# Patient Record
Sex: Female | Born: 1940
Health system: Southern US, Community
[De-identification: ages and names within clinical notes are randomized; demographics above are authoritative.]

## PROBLEM LIST (undated history)

## (undated) DIAGNOSIS — Z45018 Encounter for adjustment and management of other part of cardiac pacemaker: Secondary | ICD-10-CM

## (undated) DIAGNOSIS — D649 Anemia, unspecified: Secondary | ICD-10-CM

## (undated) DIAGNOSIS — I10 Essential (primary) hypertension: Secondary | ICD-10-CM

## (undated) DIAGNOSIS — M1990 Unspecified osteoarthritis, unspecified site: Secondary | ICD-10-CM

## (undated) DIAGNOSIS — E7800 Pure hypercholesterolemia, unspecified: Secondary | ICD-10-CM

## (undated) DIAGNOSIS — I442 Atrioventricular block, complete: Secondary | ICD-10-CM

## (undated) DIAGNOSIS — E119 Type 2 diabetes mellitus without complications: Secondary | ICD-10-CM

## (undated) HISTORY — PX: TUBAL LIGATION: SHX77

## (undated) HISTORY — PX: JOINT REPLACEMENT: SHX530

## (undated) HISTORY — PX: ABDOMINAL HYSTERECTOMY: SHX81

---
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
## 1997-12-24 ENCOUNTER — Ambulatory Visit: Admission: RE | Admit: 1997-12-24 | Discharge: 1997-12-24 | Payer: Self-pay | Admitting: *Deleted

## 1997-12-30 ENCOUNTER — Inpatient Hospital Stay (HOSPITAL_COMMUNITY): Admission: RE | Admit: 1997-12-30 | Discharge: 1998-01-02 | Payer: Self-pay | Admitting: Orthopedic Surgery

## 1998-01-02 ENCOUNTER — Inpatient Hospital Stay (HOSPITAL_COMMUNITY)
Admission: RE | Admit: 1998-01-02 | Discharge: 1998-01-08 | Payer: Self-pay | Admitting: Physical Medicine and Rehabilitation

## 1998-01-13 ENCOUNTER — Other Ambulatory Visit: Admission: RE | Admit: 1998-01-13 | Discharge: 1998-01-13 | Payer: Self-pay | Admitting: *Deleted

## 1999-02-23 ENCOUNTER — Ambulatory Visit (HOSPITAL_BASED_OUTPATIENT_CLINIC_OR_DEPARTMENT_OTHER): Admission: RE | Admit: 1999-02-23 | Discharge: 1999-02-23 | Payer: Self-pay | Admitting: Orthopedic Surgery

## 1999-07-01 ENCOUNTER — Encounter: Payer: Self-pay | Admitting: *Deleted

## 1999-07-01 ENCOUNTER — Encounter: Admission: RE | Admit: 1999-07-01 | Discharge: 1999-07-01 | Payer: Self-pay | Admitting: *Deleted

## 2000-08-05 ENCOUNTER — Encounter: Admission: RE | Admit: 2000-08-05 | Discharge: 2000-08-05 | Payer: Self-pay | Admitting: *Deleted

## 2000-08-05 ENCOUNTER — Encounter: Payer: Self-pay | Admitting: *Deleted

## 2001-07-23 ENCOUNTER — Emergency Department (HOSPITAL_COMMUNITY): Admission: EM | Admit: 2001-07-23 | Discharge: 2001-07-23 | Payer: Self-pay | Admitting: Emergency Medicine

## 2001-07-23 ENCOUNTER — Encounter: Payer: Self-pay | Admitting: Emergency Medicine

## 2002-11-28 ENCOUNTER — Encounter: Payer: Self-pay | Admitting: Internal Medicine

## 2002-11-28 ENCOUNTER — Encounter: Admission: RE | Admit: 2002-11-28 | Discharge: 2002-11-28 | Payer: Self-pay | Admitting: Internal Medicine

## 2003-01-08 ENCOUNTER — Other Ambulatory Visit: Admission: RE | Admit: 2003-01-08 | Discharge: 2003-01-08 | Payer: Self-pay | Admitting: Obstetrics and Gynecology

## 2003-02-19 ENCOUNTER — Ambulatory Visit (HOSPITAL_COMMUNITY): Admission: RE | Admit: 2003-02-19 | Discharge: 2003-02-19 | Payer: Self-pay | Admitting: Gastroenterology

## 2005-03-04 ENCOUNTER — Emergency Department (HOSPITAL_COMMUNITY): Admission: EM | Admit: 2005-03-04 | Discharge: 2005-03-04 | Payer: Self-pay | Admitting: Emergency Medicine

## 2005-12-21 ENCOUNTER — Other Ambulatory Visit: Admission: RE | Admit: 2005-12-21 | Discharge: 2005-12-21 | Payer: Self-pay | Admitting: Obstetrics and Gynecology

## 2006-01-11 ENCOUNTER — Encounter: Admission: RE | Admit: 2006-01-11 | Discharge: 2006-01-11 | Payer: Self-pay | Admitting: Obstetrics and Gynecology

## 2006-02-10 ENCOUNTER — Encounter (INDEPENDENT_AMBULATORY_CARE_PROVIDER_SITE_OTHER): Payer: Self-pay | Admitting: Specialist

## 2006-02-10 ENCOUNTER — Ambulatory Visit (HOSPITAL_COMMUNITY): Admission: RE | Admit: 2006-02-10 | Discharge: 2006-02-11 | Payer: Self-pay | Admitting: Obstetrics and Gynecology

## 2007-01-02 ENCOUNTER — Ambulatory Visit (HOSPITAL_COMMUNITY): Admission: RE | Admit: 2007-01-02 | Discharge: 2007-01-03 | Payer: Self-pay | Admitting: Obstetrics and Gynecology

## 2007-01-23 ENCOUNTER — Inpatient Hospital Stay (HOSPITAL_COMMUNITY): Admission: EM | Admit: 2007-01-23 | Discharge: 2007-01-25 | Payer: Self-pay | Admitting: Emergency Medicine

## 2007-01-30 ENCOUNTER — Encounter: Admission: RE | Admit: 2007-01-30 | Discharge: 2007-01-30 | Payer: Self-pay | Admitting: Obstetrics and Gynecology

## 2008-03-07 ENCOUNTER — Encounter: Admission: RE | Admit: 2008-03-07 | Discharge: 2008-03-07 | Payer: Self-pay | Admitting: Obstetrics and Gynecology

## 2009-03-17 ENCOUNTER — Encounter: Admission: RE | Admit: 2009-03-17 | Discharge: 2009-03-17 | Payer: Self-pay | Admitting: Obstetrics and Gynecology

## 2009-06-13 ENCOUNTER — Encounter: Admission: RE | Admit: 2009-06-13 | Discharge: 2009-06-13 | Payer: Self-pay | Admitting: Orthopedic Surgery

## 2010-01-26 ENCOUNTER — Emergency Department (HOSPITAL_COMMUNITY): Admission: EM | Admit: 2010-01-26 | Discharge: 2010-01-26 | Payer: Self-pay | Admitting: Emergency Medicine

## 2010-04-20 ENCOUNTER — Encounter: Admission: RE | Admit: 2010-04-20 | Discharge: 2010-04-20 | Payer: Self-pay | Admitting: Obstetrics and Gynecology

## 2010-11-16 LAB — BASIC METABOLIC PANEL
BUN: 19 mg/dL (ref 6–23)
CO2: 24 mEq/L (ref 19–32)
Calcium: 9 mg/dL (ref 8.4–10.5)
Creatinine, Ser: 0.79 mg/dL (ref 0.4–1.2)
GFR calc Af Amer: >60 mL/min (ref >60)
GFR calc non Af Amer: >60 mL/min (ref >60)
Glucose, Bld: 181 mg/dL — ABNORMAL HIGH (ref 70–99)
Potassium: 4.1 mEq/L (ref 3.5–5.1)

## 2010-11-16 LAB — CBC
Hemoglobin: 10.7 g/dL — ABNORMAL LOW (ref 12.0–15.0)
MCV: 78.2 fL (ref 78.0–100.0)

## 2010-11-16 LAB — POCT CARDIAC MARKERS
CKMB, poc: 1 ng/mL (ref 1.0–8.0)
Myoglobin, poc: 70.7 ng/mL (ref 12–200)
Troponin i, poc: <0.05 ng/mL (ref 0.00–0.09)

## 2011-01-12 NOTE — Discharge Summary (Signed)
NAMEMARANGELY, SANANDRES                   ACCOUNT NO.:  [id number]   MEDICAL RECORD NO.:  [id number]          PATIENT TYPE:  INP   LOCATION:  6529                         FACILITY:  [facility]   PHYSICIAN:  Neysa Bonito, MD  DATE OF BIRTH:  04-25-1941   DATE OF ADMISSION:  01/23/2007  DATE OF DISCHARGE:                               DISCHARGE SUMMARY   PRIMARY CARE PHYSICIAN:  Dr. Jilda Panda.   CHIEF COMPLAINT ON PRESENTATION:  Chest pain.   HISTORY OF PRESENT ILLNESS:  [age]-year-old with past medical  history significant for type 2 diabetes, hypertension, presented with  chest pain and admitted for further assessment and to rule out MI/ACS.   HOSPITAL COURSE:  During hospital course the patient has been stable.  Pain is relieved and cardiac enzymes is negative 3 times.  Patient had  2D echo pending.  The echo is done, but the result is pending.  So, we  will review the 2D echo and recommend result on the paper chart.   DISCHARGE PLAN:  Patient is ruled out for acute coronary syndrome.  She  has a history of diabetes, obesity and abnormal EKGs with evidence of  left bundle branch block make it difficult to assess the ST and T waves.  The plan is to discharge the patient and follow up with her primary  physician, Dr. Mellody Drown, for further assessment and maybe possible stress  test as outpatient.   DISCHARGE DIAGNOSES:  1. Chest pain, not secondary to acute coronary syndrome.  2. Type 2 diabetes.  3. Hypertension.   DISCHARGE MEDICATIONS:  1. Aspirin 325 mg p.o. daily.  2. Insulin is sliding scale.  3. Avapro 150 mg p.o. daily.  4. Metoprolol 25 mg b.i.d.  5. Protonix 40 mg daily.  6. Zocor 10 mg p.o. at night.  7. Dulcolax 10 mg p.r.n.  8. Ambien p.r.n.   DISCHARGE PLAN:  Again, the patient is stable for discharge today, to  follow up with her primary for further testing as necessary.      Neysa Bonito, MD  Electronically Signed     EME/MEDQ  D:  01/25/2007   T:  01/25/2007  Job:  [id number]

## 2011-01-12 NOTE — H&P (Signed)
NAMEBRADLIE, Krista Clarke                   ACCOUNT NO.:  [id number]   MEDICAL RECORD NO.:  [id number]          PATIENT TYPE:  EMS   LOCATION:  MAJO                         FACILITY:  [facility]   PHYSICIAN:  Jana Hakim, M.D. DATE OF BIRTH:  19-Jun-1941   DATE OF ADMISSION:  01/23/2007  DATE OF DISCHARGE:                              HISTORY & PHYSICAL   PRIMARY CARE PHYSICIAN:  Dr. Jilda Panda.   CHIEF COMPLAINT:  Chest pain.   HISTORY OF PRESENT ILLNESS:  This is a 70 year old female who presented  with complaints of left-sided chest pain radiating into the left arm.  The pain started in the afternoon and was described as being  intermittent at first, but became a constant unremitting pain.  She  rated the pain at the worst as being on a 9/10 and described the pain as  being heaviness in the chest and in the arm.  She also reports having  symptoms of shortness of breath and diaphoresis associated with the  discomfort.  The pain was not relieved until she arrived in the  emergency department and was administered nitroglycerin sublingual x1.  She denies having any nausea, vomiting and diarrhea associated with the  pain.  She denies having any previous similar episodes.   PAST MEDICAL HISTORY:  1. Type 2 diabetes mellitus.  2. Hypertension.   PAST SURGICAL HISTORY:  1. Status post bilateral tubal ligation.  2. Bilateral knee replacements, the left in [phone], the right      in [id number].  3. Recent bladder suspension surgery on May5,2008.   MEDICATIONS:  Medications need to be verified.  The medications are  reported as being metoprolol, Micardis, lovastatin, glipizide, Nexium,  Ecotrin and calcium.   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  No tobacco or alcohol usage.   FAMILY HISTORY:  Positive for coronary artery disease in her mother.  Positive for hypertension in both of her parents.   Positive for diabetes  in her mother and positive for cancer; brother had squamous cell  carcinoma of the larynx.   REVIEW OF SYSTEMS:  Pertinent are mentioned above.  The patient denies  having any syncope or seizures associated with the episode.   PHYSICAL EXAMINATION:  GENERAL:  A pleasant 70 year old obese female in  no acute distress currently.  VITAL SIGNS:  Temperature 98.6, blood pressure 149/58, heart rate 74,  respirations 20, O2 saturations 97%.  HEENT:  Normocephalic, atraumatic.  Pupils are equally round and  reactive to light.  Extraocular muscles are intact.  Funduscopic benign.  Oropharynx is clear.  NECK:  Supple, full range of motion.  No thyromegaly, adenopathy or  jugular venous distention.  CARDIOVASCULAR:  Regular rate and rhythm.  No murmurs, gallops or rubs.  LUNGS:  Clear to auscultation bilaterally.  ABDOMEN:  Positive bowel sounds.  Soft, nontender, nondistended.  EXTREMITIES:  Without edema.  NEUROLOGIC:  The patient is alert and oriented x3.  There are no focal  deficits.  GENITOURINARY/RECTAL:  Deferred.  LABORATORY STUDIES:  White blood cell count 6.6, hemoglobin 9.9,  hematocrit 30.5, MCV 76.0, platelets 320,000.  Cardiac enzymes:  Myoglobin 50.4, CK-MB less than 1.0, troponin less than 0.05.  D-dimer  level equal to 0.77.   EKG:  Normal sinus rhythm with a left bundle branch block.   Chest x-ray reveals no acute disease process.   CT scan of the chest with the angiogram is negative for a pulmonary  embolism.  Of note, a 2- to 3-mm peripheral nodule is seen in the right  upper lobe suggestive of nodular scarring.   ASSESSMENT:  [age]-year-old female being admitted with:  1. Chest pain, rule out myocardial infarction.  2. Type 2 diabetes mellitus.  3. Hypertension.  4. Microcytic anemia.   PLAN:  The patient will be admitted to a telemetry area and cardiac  enzymes will be cycled.  The patient will be placed on nitrates, oxygen  and  beta blocker therapy and she will continue on her regular  medications, once they are verified.  Sliding-scale insulin coverage has  also been ordered..  The patient has been placed on DVT and GI  prophylaxis as well.  A 2-D echo has also been ordered.  An anemia  workup will also follow.      Jana Hakim, M.D.  Electronically Signed     HJ/MEDQ  D:  01/23/2007  T:  01/23/2007  Job:  [id number]   cc:   Jilda Panda, M.D.

## 2011-01-15 NOTE — Discharge Summary (Signed)
NAMEJIMENNA, INTERIANO                   ACCOUNT NO.:  [id number]   MEDICAL RECORD NO.:  [id number]          PATIENT TYPE:  OIB   LOCATION:  9320                          FACILITY:  [facility]   PHYSICIAN:  Everett Graff, M.D.   DATE OF BIRTH:  May 07, 1941   DATE OF ADMISSION:  02/10/2006  DATE OF DISCHARGE:  02/11/2006                                 DISCHARGE SUMMARY   DISCHARGE DIAGNOSES:  1.  Fibroid uterus, uterine prolapse, symptomatic cystocele, rectocele,      urinary incontinence with hypermobile urethra.  2.  Anemia.   OPERATION:  On the date of admission the patient underwent a  laparoscopically-assisted vaginal hysterectomy with bilateral salpingo-  oophorectomy, anterior and posterior repair with placement of tension-free  vaginal tape.  The patient also went underwent cystoscopy demonstrating  patency of both ureters.   HISTORY OF PRESENT ILLNESS:  Ms. Sartwell is a 70 year old married African-  American female who presents for a laparoscopically-assisted vaginal  hysterectomy with bilateral salpingo-oophorectomy, anterior-posterior repair  and placement of tension-free vaginal tape, because of uterine prolapse,  symptomatic cystocele causing incontinence, and a rectocele.  Please see the  patient's dictated history and physical examination for details.   PREOPERATIVE PHYSICAL EXAMINATION:  VITAL SIGNS:  Blood pressure 140/78,  weight is 221 pounds, height is 5 feet 3 inches tall.  GENERAL:  Within normal limits.  PELVIC EXAM: EGBUS is atrophic.  Vagina reveals pelvic relaxation with 3/4  cystocele, it too is atrophic.  The patient has a second-degree uterine  prolapse.  Her cervix is nontender without lesions.  Uterus appears normal  size, shape and consistency.  However, it is tender (the patient's exam was  limited by body habitus).  Adnexa was without masses, though it was tender  bilaterally, and the patient was noted to have a second-degree rectocele.  Her rectovaginal exam  did not reveal any masses.   HOSPITAL COURSE:  On the day of admission the patient underwent  aforementioned procedures, tolerating them all well.  Postoperative course  was unremarkable with the patient tolerating a hemoglobin of 8.1  (preoperative hemoglobin was 10.0).  By postoperative  day #1, the patient  had resumed bowel and bladder function and was therefore deemed ready for  discharge home.   DISCHARGE MEDICATIONS:  1.  Motrin 600 mg q.6h. with food as needed for pain.  2.  Vicodin one to two tablets q.4-6h. as needed for breakthrough pain.  3.  Estrace vaginal cream 1 gm daily for one week, then twice weekly.   FOLLOW UP:  The patient is scheduled for a blood pressure check at [facility] on February 14, 2006, at 9:30 a.m.  She is scheduled for six  weeks postoperative visit with Dr. Mancel Bale on March 29, 2006, at 2:00 p.m.   DISCHARGE INSTRUCTIONS:  The patient was given a copy of [facility] postoperative instruction sheet.  She was advised to call the doctor  with any temperature greater than or equal to 100.4, severe abdominal pain,  vomiting, or headache.  The patient was also advised  to not take her blood  pressure medicine over the weekend.  She was further advised to avoid  driving for two weeks, heavy lifting for four weeks, intercourse for six  weeks, that she may walk up steps, may shower, may increase her activities  slowly.  Her diet is to be a diabetic diet.   FINAL PATHOLOGY:  Uterus, hysterectomy:  Cervix with mucosal ulceration,  inflammation, and reactive changes; benign inactive endometrium;  leiomyomata; bilateral ovaries and fallopian tubes resection--benign,  unremarkable ovaries and fallopian tubes.      Elmira J. Elizebeth Koller.      Everett Graff, M.D.  Electronically Signed    EJP/MEDQ  D:  03/13/2006  T:  03/13/2006  Job:  [id number]

## 2011-01-15 NOTE — Op Note (Signed)
NAMESTERLING, Krista Clarke                   ACCOUNT NO.:  [id number]   MEDICAL RECORD NO.:  [id number]          PATIENT TYPE:  OIB   LOCATION:  9307                          FACILITY:  [facility]   PHYSICIAN:  Everett Graff, M.D.   DATE OF BIRTH:  [birth year]   DATE OF PROCEDURE:  01/02/2007  DATE OF DISCHARGE:                               OPERATIVE REPORT   PREOPERATIVE DIAGNOSES:  1. Recurrent symptomatic cystocele.  2. Urge incontinence.   POSTOPERATIVE DIAGNOSES:  1. Recurrent symptomatic cystocele.  2. Urge incontinence.   PROCEDURE:  Anterior repair with Gynemesh placement and cystoscopy.   ATTENDING:  Dr. Everett Graff.   ASSISTANT:  Elmira J. Elizebeth Koller.   ANESTHESIA:  General.   SPECIMENS TO PATHOLOGY:  None.   FLUIDS:  1700 mL.   URINE OUTPUT:  200 mL.   ESTIMATED BLOOD LOSS:  200 mL.   COMPLICATIONS:  None.   PROCEDURE:  The patient was taken to the operating room after the risks,  benefits, and alternatives were reviewed with the patient.  The patient  verbalized understanding and consent signed and witnessed.  The risks  included but were not limited to bleeding, infection, injury and  exposure of the mesh.  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  The patient's primary complaints are bulge in the vagina and  return of urge incontinence since bulge  has returned.  The patient  primarily desires treatment for that.   The patient was placed under general anesthesia and prepped and draped  in the normal sterile fashion in the dorsal lithotomy position.  A  weighted speculum was placed in the vagina and 100 mL of dilute  Pitressin of 20 units of Pitressin and 100 mL of saline was administered  into the anterior vaginal wall mucosa.  This hydrodissection was  performed and then an incision was made and the  vaginal mucosa dissected  away from the bladder.  This was done down to the level of the ischial  spines bilaterally.  The mesh was then cut with four arms and the  distal/apical arms placed deep in the pelvis towards the ischial spines.  The defect was entirely covered with the mesh and the two more proximal  arms were shorter but also placed deeper in the pelvis.  Prior to  placing the mesh the two defects over the bladder were plicated with 2-0  Vicryl.  The mesh was anchored in place proximally.  There was some  bleeding on the patient's right which was made hemostatic with two 3-0  Vicryl stitches.  Two size 7 round JP drains were placed in the  dissected areas toward the ischial spines and the vaginal mucosa was  repaired with 2-0 Vicryl via interrupted stitches.   Sponge, lap and needle count was correct.  The patient tolerated the  procedure well and was returned to the recovery room in good condition.  Everett Graff, M.D.  Electronically Signed     AR/MEDQ  D:  01/03/2007  T:  01/03/2007  Job:  [id number]

## 2011-01-15 NOTE — H&P (Signed)
NAME:  Nobles, Reeve                   ACCOUNT NO.:  [id number]   MEDICAL RECORD NO.:  [id number]          PATIENT TYPE:  AMB   LOCATION:  SDC                           FACILITY:  [facility]   PHYSICIAN:  Everett Graff, M.D.   DATE OF BIRTH:  27-Feb-1941   DATE OF ADMISSION:  02/10/2006  DATE OF DISCHARGE:                                HISTORY & PHYSICAL   HISTORY OF PRESENT ILLNESS:  Ms. Matesic is a 70 year old married African-  American female who presents for a laparoscopically-assisted vaginal  hysterectomy with bilateral salpingo-oophorectomy, anterior-posterior  repair, and placement of tension-free vaginal tape because of uterine  prolapse, symptomatic cystocele with incontinence and a rectocele.  For the  past several months the patient has noted a bulge in her vaginal area  whenever she wipes.  Additionally, she has had increasing urinary urgency  with generalized abdominal tenderness and the urge to strain to completely  empty her bladder.  She denies any hematuria, fever, nausea or vomiting,  diarrhea, dysuria or changes in her bowel habits.  In April 2007 the patient  had a pelvic ultrasound which was within normal limits.  Cystometrics done  at that same time revealed a hypermobile urethra and a diagnosis of urge  incontinence.  After consideration of her medical and surgical options for  management of all her symptoms, the patient has decided to proceed with  definitive therapy in the form of hysterectomy with anterior-posterior  repair and placement of tension-free vaginal tape.   PAST MEDICAL HISTORY:  OB history:  Gravida 6, para 5-0-1-5.  She had  spontaneous vaginal births each time.   GYN history:  Menarche at 70 years old.  The patient has been menopausal for  15 years.  Denies any history of sexually transmitted diseases.  Her last  Pap smear was low-grade squamous intraepithelial lesion (December 21, 2005).  She had a normal mammogram in 2004.   Medical history is positive  for hypertension, anemia, diabetes mellitus and  knee arthritis.   Surgical history:  In 1974, bilateral tubal ligation.   FAMILY HISTORY:  Heart disease, throat cancer, hypertension, diabetes  mellitus and strokes.   HABITS:  The patient does not use cigarettes, alcohol or illicit drugs.   SOCIAL HISTORY:  The patient is retired and lives at home with her husband.   CURRENT MEDICATIONS:  1.  Metformin 100 mg twice daily.  2.  Lovastatin 40 mg daily.  3.  Metoprolol 50 mg twice daily.  4.  Glipizide 10 mg twice daily.  5.  Micardis HCT 80/12.5 mg one tablet daily.  6.  Iron twice daily.  7.  Calcium 600 mg twice daily.  8.  Multivitamin one tablet daily.  9.  Allegra 180 mg one tablet daily when needed.  [place] liver oil one tablespoon daily.  11. Detrol LA 4 mg one tablet daily.   The patient has no known drug allergies.   REVIEW OF SYSTEMS:  The patient does wear glasses.  She does complain of  incontinence.  Denies chest pain, shortness of breath and, except  as  mentioned in history of present illness, the patient's review of systems is  negative.   PHYSICAL EXAMINATION:  VITAL SIGNS:  Blood pressure is 140/78, weight is 221  pounds, height is 5 feet 3 inches tall.  NECK:  Supple without masses.  There is no thyromegaly or cervical  adenopathy.  CARDIAC:  Regular rate and rhythm.  There is no murmur.  LUNGS:  Clear to auscultation.  There are no wheezes, rales or rhonchi.  BACK:  No CVA tenderness.  ABDOMEN:  Bowel sounds are present.  It is soft with diffuse tenderness but  no guarding or rebound.  EXTREMITIES:  Without clubbing, cyanosis, or edema.  PELVIC:  EG, BUS is atrophic.  Vagina reveals pelvic relaxation with 3/4  cystocele.  It too is atrophic.  The patient has second degree uterine  prolapse.  Cervix is nontender without lesions.  Uterus appears normal size,  shape and consistency, tender.  Exam is limited by body habitus.  Adnexa  without masses, though  it is tender bilaterally.  The patient is noted to  have a second degree rectocele.  Rectovaginal without masses.   IMPRESSION:  1.  Uterine prolapse.  2.  Symptomatic cystocele.  3.  Rectocele.  4.  Urge incontinence.   DISPOSITION:  A discussion was held with the patient regarding the  indication for her various procedures along with their risks, which include  but are not limited to reaction to anesthesia, damage to adjacent organs,  infection, excessive bleeding, possible erosion of her tension-free vaginal  tape and the possibility that her procedures will not eliminate her urinary  tract symptoms.  The patient verbalized understanding and has consented to  proceed with a laparoscopically-assisted vaginal hysterectomy with bilateral  salpingo-oophorectomy, along with anterior-posterior repair and placement of  tension-free vaginal tape and cystoscopy at [facility]  on February 10, 2006, at 11 a.m.      Elmira J. Elizebeth Koller.      Everett Graff, M.D.  Electronically Signed    EJP/MEDQ  D:  02/07/2006  T:  02/07/2006  Job:  [id number]

## 2011-01-15 NOTE — Op Note (Signed)
NAMECOZY, EADS                   ACCOUNT NO.:  [id number]   MEDICAL RECORD NO.:  [id number]          PATIENT TYPE:  OIB   LOCATION:  9320                          FACILITY:  [facility]   PHYSICIAN:  Everett Graff, M.D.   DATE OF BIRTH:  1941/05/05   DATE OF PROCEDURE:  02/10/2006  DATE OF DISCHARGE:  02/11/2006                                 OPERATIVE REPORT   PREOPERATIVE DIAGNOSES:  1.  Uterine prolapse.  2.  Symptomatic cystocele and rectocele.  3.  Urinary incontinence with hypermobile urethra.   POSTOPERATIVE DIAGNOSES:  1.  Uterine prolapse.  2.  Symptomatic cystocele and rectocele.  3.  Urinary incontinence with hypermobile urethra.   PROCEDURES:  1.  Laparoscopically-assisted vaginal hysterectomy.  2.  Anterior and posterior repair.  3.  Tension-free vaginal tape.  4.  Cystoscopy.  5.  Bilateral salpingo-oophorectomy.   SURGEON:  Everett Graff, M.D.   ASSISTANT:  Jon Billings. Powell, P.A.-C.   ANESTHESIA:  General.   SPECIMENS TO PATHOLOGY:  Uterus, cervix and bilateral ovaries and tubes.   FLUIDS:  3600 mL.   URINE OUTPUT:  1000 mL.   ESTIMATED BLOOD LOSS:  200 mL.   FINDINGS:  Uterus weighing less than 250 g.   COMPLICATIONS:  None.   PROCEDURE:  The patient was taken to the operating room after risks,  benefits and alternatives reviewed with the patient.  The patient verbalized  understanding and consent signed and witnessed.  The patient was placed  under general anesthesia and prepped and draped in normal sterile fashion in  the dorsal lithotomy position.  A bivalve speculum was placed in the  patient's vagina and a Hulka placed for intrauterine manipulation.  Attention was then turned to the abdomen where a 10 mm incision was made at  the umbilicus and Veress needle passed into the intra-abdominal cavity and  pneumoperitoneum achieved.  Veress needle was removed and a 10 mm trocar  advanced into the intra-abdominal cavity nonbladed.  The laparoscope  was  introduced and normal-appearing uterus, bilateral ovaries and fallopian  tubes.  The left lower quadrant was then identified and a 5 mm incision made  and 5 mm trocar advanced under direct visualization.  The same was done in  the right lower quadrant as well.  The right round ligament was elevated  after identifying the ureters peristalsed bilaterally and the right round  ligament was then cauterized with the gyrus tripolar and cut and bladder  flap created.  The utero-ovarian ligament was then cauterized and cut with a  gyrus tripolar as well.  The fallopian tube in a similar fashion was  cauterized and cut.  The ovary and fallopian tube was then free and two  Endoloops of 0 Vicryl were placed on the ovary and fallopian tube which were  then excised and placed in the posterior cul-de-sac.  The pedicle was noted  to be hemostatic.  In a similar fashion on the contralateral side, the round  ligament, utero-ovarian ligament and fallopian tube were cauterized with the  gyrus tripolar and cut and once the ovary  and fallopian tube were free, two  0 Vicryl Endoloops were placed and ovary and fallopian tube excised and  placed in the posterior cul-de-sac.  The pedicle was also noted to be  hemostatic.  Bladder flap had been created on the left side as well.  The  ureters were noted again to peristals and instruments were covered in a  sterile fashion and attention was then turned below to the vaginal  hysterectomy portion of the case.  The Hulka was removed and weighted  speculum and vaginal wall retractors placed.  The cervix was circumscribed  with the Bovie and the anterior cul-de-sac entered without difficulty.  The  posterior cul-de-sac was entered as well with the Mayo scissors and long  weighted speculum placed.  The uterosacral ligaments were bilaterally  clamped with curved Heaney, cut and suture ligated with 0 Vicryl.  The  paracervical and remaining parametrial tissue  incorporating the cardinal  ligament as well was clamped, cut and suture ligated using 0 Vicryl until  the uterus was able to be removed and sent to pathology.  The pedicles were  noted to be hemostatic, a McCall culdoplasty stitch was placed using 0  Vicryl.  The anterior repair was then performed and the anterior vaginal  wall was undermined and dissected away from the underlying tissue.  The  cystocele was repaired using Kelly plication stitches using 2-0 Vicryl.  The  excess vaginal mucosa was excised and the anterior vaginal wall repaired  using 2-0 Vicryl in a running locked fashion.  Dilute Pitressin was injected  underneath the mid urethra and in a similar fashion the vaginal mucosa was  incised and the underlying tissue dissected away from the vaginal mucosa to  the lower edge of the symphysis pubis bilaterally.  To 5 mm incisions were  made approximately two fingerbreadths from the midline on the mons pubis.  The transabdominal guides, after emptying the bladder completely and placing  a rigid urethral catheter guide along with 18-French Foley catheter.  The  transabdominal guide was placed on the patient's left and passed through the  space of Retzius out through the vaginal wall while the rigid ureteral  catheter guide was deflected to the ipsilateral side.  The same was done on  the contralateral side.  Cystoscopy was then performed and no inadvertent  bladder injury was noted.  The mesh was attached to the transabdominal guide  and elevated through the level of the incisions on the mons pubis through  the space of Retzius.  Cystoscopy was performed again and no inadvertent  bladder injury was noted.  While passing the mesh, the rigid ureteral  catheter guide was deflected to the ipsilateral side.  The mesh was left  slack beneath the mid urethra using approximately the width of a large  Kelly.  The vaginal mucosa was repaired with 2-0 Vicryl via interrupteds. The mesh was cut  off flush with the two incisions at the mons pubis and  repaired with Dermabond.  Attention was then turned to the posterior vaginal  wall which was injected with dilute Pitressin and the posterior vaginal  mucosa incised and the underlying tissue dissected away from the vaginal  mucosa.  The rectocele was repaired with Kelly plication stitches using 2-0  Vicryl.  The excess vaginal mucosa was excised and the posterior repair  closed using 2-0 Vicryl in a running locked fashion.  Gloves were then  changed and attention was then turned again to the abdomen and  pneumoperitoneum achieved  again and laparoscope introduced.  There was  hemostasis noted at all pedicle sites.  The pneumoperitoneum was relieved  and the two lower quadrant trocars were removed under direct visualization  and good hemostasis was noted.  The 10 mm trocar was removed under direction  visualization as well and the fascia repaired at the umbilicus with 0 Vicryl  in a running fashion.  The subcutaneous tissue was also reapproximated using  0 Vicryl and the skin was reapproximated  using 3-0 Monocryl.  The two 5 mm lower quadrant incisions were repaired  with Dermabond and incisions were dressed.  Sponge, lap and needle count was  correct.  The patient tolerated the procedure well and was returned to the  recovery room in good condition.      Everett Graff, M.D.  Electronically Signed     AR/MEDQ  D:  02/28/2006  T:  02/28/2006  Job:  [id number]

## 2011-01-15 NOTE — H&P (Signed)
NAME:  Krista Clarke, Krista Clarke                   ACCOUNT NO.:  [id number]   MEDICAL RECORD NO.:  [id number]          PATIENT TYPE:  AMB   LOCATION:  SDC                           FACILITY:  [facility]   PHYSICIAN:  Everett Graff, M.D.   DATE OF BIRTH:  1941/05/22   DATE OF ADMISSION:  DATE OF DISCHARGE:                              HISTORY & PHYSICAL   HISTORY OF PRESENT ILLNESS:  Ms. Binkerd is a 70 year old married African-  American female, para 5-0-1-5, who is status post laparoscopic-assisted  vaginal hysterectomy with bilateral salpingo-oophorectomy,  anterior/posterior repair, with placement of tension-free vaginal tape  (01/2006), who presents for anterior repair with mesh because of a  symptomatic cystocele and urge incontinence.  Patient reports that  approximately two months ago she developed a sensation as if her  bladder had dropped, which resembled her previous symptoms prior to  her surgery last year.  She went on to report urinary frequency, urgency  and incontinence.  In spite of a negative urinalysis, a trial of Kegel  exercises, and the use of Detrol-LA, patient's symptoms persisted.  She  presents now for a repeat anterior repair with replacement of tension-  free vaginal tape with mesh for management of her symptoms.   PAST MEDICAL HISTORY:  OB history:  Gravida 6, para 5-0-1-5.  Patient  had spontaneous vaginal births each time.  GYN history:  Menarche 70  years old.  Patient has had a hysterectomy.  She denies any history of  sexually transmitted diseases.  She does have a history of an abnormal  Pap smear in April of 2007 which revealed low-grade squamous  intraepithelial lesion (that was her last Pap smear).  Patient had a  normal mammogram in 2007.  Patient's medical history is positive for  hypertension, anemia, non-insulin-dependent diabetes, and knee  arthritis.   PAST SURGICAL HISTORY:  In 1974, bilateral tubal ligation.  In 2007,  laparoscopic-assisted vaginal hysterectomy  with bilateral salpingo-  oophorectomy, anterior/posterior repair with placement of tension-free  vaginal tape.  Denies any history of blood transfusions or problems with  anesthesia.   FAMILY HISTORY:  1. Heart disease.  2. Throat cancer.  3. Hypertension.  4. Diabetes mellitus.  5. Strokes.   HABITS:  Patient does not use cigarettes, alcohol or illicit drugs.   SOCIAL HISTORY:  Patient is retired and lives at home with her husband.   CURRENT MEDICATIONS:  1. Metformin 1000 mg twice daily.  2. Lovastatin 40 mg daily.  3. Metoprolol 50 mg twice daily.  4. Glipizide 10 mg twice daily.  5. Micardis HCT 80/25 mg one tablet daily.  6. Iron twice daily.  7. Calcium 600 mg twice daily (with vitamin D).  8. Multivitamin one tablet daily.  9. Allegra 180 mg one tablet daily as needed.  10.Detrol-LA 40 mg daily.   Patient has no known drug allergies.   REVIEW OF SYSTEMS:  Patient does wear glasses.  Does complain of urinary  incontinence.  She denies any chest pain, shortness of breath, headache,  vision changes, and except as mentioned in history of present  illness,  patient's review of systems is negative.   PHYSICAL EXAMINATION:  VITAL SIGNS:  Blood pressure 120/70, height 5  feet 3 inches tall, weight 221 pounds.  BACK:  No CVA tenderness.  ABDOMEN:  Without tenderness, masses or organomegaly.  PELVIC:  EG/BUS is normal.  Vagina is normal.  Uterus and cervix are  surgically absent, however, do note patient does have a cystocele.  Adnexa without tenderness or masses.   IMPRESSION:  1. Symptomatic cystocele.  2. Urge incontinence.   DISPOSITION:  A discussion was held with patient regarding the  indication for her procedure along with its risks, which include but are  not limited to, reaction to anesthesia, damage to adjacent organs,  infection, excessive bleeding, erosion of tension-free vaginal  tape/mesh, and worsening of her incontinence symptoms.  Patient has   consented to proceed with an anterior repair and placement of tension-  free vaginal tape with mesh at [facility] on Jan 02, 2007.      [place] J. Elizebeth Koller.      Everett Graff, M.D.  Electronically Signed    EJP/MEDQ  D:  12/30/2006  T:  12/30/2006  Job:  [id number]

## 2011-07-13 ENCOUNTER — Other Ambulatory Visit: Payer: Self-pay | Admitting: Family Medicine

## 2011-07-13 DIAGNOSIS — Z1231 Encounter for screening mammogram for malignant neoplasm of breast: Secondary | ICD-10-CM

## 2011-08-06 ENCOUNTER — Ambulatory Visit
Admission: RE | Admit: 2011-08-06 | Discharge: 2011-08-06 | Disposition: A | Discharge status: Home / Self Care | Payer: Medicare Other | Source: Ambulatory Visit | Attending: Family Medicine | Admitting: Family Medicine

## 2011-08-06 ENCOUNTER — Other Ambulatory Visit: Payer: Self-pay | Admitting: Internal Medicine

## 2011-08-06 DIAGNOSIS — Z1231 Encounter for screening mammogram for malignant neoplasm of breast: Secondary | ICD-10-CM

## 2012-06-08 ENCOUNTER — Emergency Department (HOSPITAL_COMMUNITY)
Admission: EM | Admit: 2012-06-08 | Discharge: 2012-06-08 | Disposition: A | Discharge status: Home / Self Care | Payer: Medicare Other | Attending: Emergency Medicine | Admitting: Emergency Medicine

## 2012-06-08 ENCOUNTER — Encounter (HOSPITAL_COMMUNITY): Payer: Self-pay | Admitting: *Deleted

## 2012-06-08 ENCOUNTER — Emergency Department (HOSPITAL_COMMUNITY): Payer: Medicare Other

## 2012-06-08 DIAGNOSIS — E119 Type 2 diabetes mellitus without complications: Secondary | ICD-10-CM | POA: Insufficient documentation

## 2012-06-08 DIAGNOSIS — Z79899 Other long term (current) drug therapy: Secondary | ICD-10-CM | POA: Insufficient documentation

## 2012-06-08 DIAGNOSIS — R110 Nausea: Secondary | ICD-10-CM | POA: Insufficient documentation

## 2012-06-08 DIAGNOSIS — E7800 Pure hypercholesterolemia, unspecified: Secondary | ICD-10-CM | POA: Insufficient documentation

## 2012-06-08 DIAGNOSIS — R079 Chest pain, unspecified: Secondary | ICD-10-CM | POA: Insufficient documentation

## 2012-06-08 DIAGNOSIS — M129 Arthropathy, unspecified: Secondary | ICD-10-CM | POA: Insufficient documentation

## 2012-06-08 DIAGNOSIS — I10 Essential (primary) hypertension: Secondary | ICD-10-CM | POA: Insufficient documentation

## 2012-06-08 DIAGNOSIS — D649 Anemia, unspecified: Secondary | ICD-10-CM | POA: Insufficient documentation

## 2012-06-08 HISTORY — DX: Essential (primary) hypertension: I10

## 2012-06-08 HISTORY — DX: Type 2 diabetes mellitus without complications: E11.9

## 2012-06-08 HISTORY — DX: Pure hypercholesterolemia, unspecified: E78.00

## 2012-06-08 HISTORY — DX: Unspecified osteoarthritis, unspecified site: M19.90

## 2012-06-08 LAB — LIPASE, BLOOD: Lipase: 42 U/L (ref 11–59)

## 2012-06-08 LAB — TROPONIN I: Troponin I: <0.3 ng/mL (ref <0.30)

## 2012-06-08 LAB — COMPREHENSIVE METABOLIC PANEL
Alkaline Phosphatase: 65 U/L (ref 39–117)
BUN: 21 mg/dL (ref 6–23)
CO2: 28 mEq/L (ref 19–32)
Calcium: 9.9 mg/dL (ref 8.4–10.5)
Chloride: 99 mEq/L (ref 96–112)
Creatinine, Ser: 0.91 mg/dL (ref 0.50–1.10)
GFR calc non Af Amer: 62 mL/min — ABNORMAL LOW (ref >90)

## 2012-06-08 LAB — POCT I-STAT TROPONIN I

## 2012-06-08 LAB — CBC
MCV: 77.1 fL — ABNORMAL LOW (ref 78.0–100.0)
Platelets: 339 10*3/uL (ref 150–400)
RBC: 4.33 MIL/uL (ref 3.87–5.11)
RDW: 14.7 % (ref 11.5–15.5)
WBC: 6.7 10*3/uL (ref 4.0–10.5)

## 2012-06-08 MED ORDER — MORPHINE SULFATE 2 MG/ML IJ SOLN: 2.0000 mg | Freq: Once | INTRAMUSCULAR | Status: DC

## 2012-06-08 MED ORDER — MORPHINE SULFATE 4 MG/ML IJ SOLN
4.0000 mg | Freq: Once | INTRAMUSCULAR | Status: AC
  Administered 2012-06-08: 4 mg via INTRAMUSCULAR
  Filled 2012-06-08: qty 1

## 2012-06-08 NOTE — ED Notes (Signed)
The patient is AOx4 and comfortable with her discharge instructions. 

## 2012-06-08 NOTE — ED Notes (Signed)
Patient with two hour acute onset of chest pain on her left/central location of her chest.  Patient denies any radiation of the pain, no shortness of breath, but did get a little nausea.  Patient is alert and oriented x 3 and ambulatory.  Took nexium without relief.

## 2012-06-08 NOTE — ED Provider Notes (Signed)
History     CSN: [id number]  Arrival date & time 06/08/12  1543   First MD Initiated Contact with Patient 06/08/12 1607      Chief Complaint  Patient presents with  . Chest Pain    (Consider location/radiation/quality/duration/timing/severity/associated sxs/prior treatment) HPI Comments: Patient reports she began having left sided chest pain approximately 3 hours ago at rest.  Associated nausea.  Pt reports pain has been constant since it began, waxes and wanes without going away.  Pain is described as sharp and heavy, worse with palpation and improved with laying flat.  Pt took a nexium when the pain began without any improvement.  Pain is currently "kind of light," rated a 7/10 intensity.  Pt has had a cough for the past several days.  Denies fevers, chills, diaphoresis, SOB, lightheadedness/dizziness, leg swelling.  Denies abdominal pain, vomiting or diarrhea.  Pt has been admitted to chest pain rule outs in the past, states this feels the same.  Also states this feels like prior costochondritis.  Family hx CAD: mother noted to have angina in her 48s, no MI or stents/CABG.    The history is provided by the patient.    Past Medical History  Diagnosis Date  . Hypertension   . Diabetes mellitus without complication   . Hypercholesteremia   . Arthritis     History reviewed. No pertinent past surgical history.  History reviewed. No pertinent family history.  History  Substance Use Topics  . Smoking status: Never Smoker   . Smokeless tobacco: Not on file  . Alcohol Use: No    OB History    Grav Para Term Preterm Abortions TAB SAB Ect Mult Living                  Review of Systems  Constitutional: Negative for fever, chills and diaphoresis.  Respiratory: Positive for cough. Negative for shortness of breath.   Cardiovascular: Positive for chest pain. Negative for leg swelling.  Gastrointestinal: Positive for nausea. Negative for vomiting, abdominal pain and diarrhea.    Neurological: Negative for dizziness, weakness, light-headedness and numbness.  All other systems reviewed and are negative.    Allergies  Review of patient's allergies indicates no known allergies.  Home Medications   Current Outpatient Rx  Name Route Sig Dispense Refill  . SITAGLIPTIN PHOSPHATE 100 MG PO TABS Oral Take 50 mg by mouth every evening.      BP 150/58  Pulse 64  Temp 98.3 F (36.8 C) (Oral)  Resp 16  SpO2 99%  Physical Exam  Nursing note and vitals reviewed. Constitutional: She appears well-developed and well-nourished. No distress.  HENT:  Head: Normocephalic and atraumatic.  Neck: Neck supple.  Cardiovascular: Normal rate, regular rhythm and intact distal pulses.        No lower extremity edema  Pulmonary/Chest: Effort normal and breath sounds normal. No respiratory distress. She has no wheezes. She has no rales. She exhibits tenderness.       Left sided chest tenderness, states this reproduces pain  Abdominal: Soft. She exhibits no distension. There is no tenderness. There is no rebound and no guarding.  Neurological: She is alert.  Skin: She is not diaphoretic.    ED Course  Procedures (including critical care time)  Labs Reviewed  CBC - Abnormal; Notable for the following:    Hemoglobin 10.6 (*)     HCT 33.4 (*)     MCV 77.1 (*)     MCH 24.5 (*)  All other components within normal limits  COMPREHENSIVE METABOLIC PANEL - Abnormal; Notable for the following:    Total Bilirubin 0.2 (*)     GFR calc non Af Amer 62 (*)     GFR calc Af Amer 71 (*)     All other components within normal limits  LIPASE, BLOOD  POCT I-STAT TROPONIN I  TROPONIN I   Dg Chest 2 View  06/08/2012  *RADIOLOGY REPORT*  Clinical Data: Left chest pain.  History of diabetes and hypertension.  CHEST - 2 VIEW  Comparison: Radiographs and CT 01/23/2007.  Findings: The heart size and mediastinal contours are stable. There are low lung volumes associated with chronic  elevation of the right hemidiaphragm.  No airspace disease or edema is present. There is no pleural effusion.  There are diffuse osteophytes of the thoracic spine.  IMPRESSION: No active cardiopulmonary process.   Original Report Authenticated By: Vivia Ewing, M.D.     4:12 PM Pt not yet in exam room.   4:28 PM Pt seen and examined.    PCP Dr Mellody Drown.  Pt denies ever seeing cardiologist.     Date: 06/08/2012  Rate: 63  Rhythm: normal sinus rhythm  QRS Axis: left  Intervals: normal  ST/T Wave abnormalities: nonspecific T wave changes  Conduction Disutrbances:left bundle branch block  Narrative Interpretation:   Old EKG Reviewed: no significant changes  5:40 PM Discussed patient, current findings, and plan with Dr Christy Gentles.  Plan is for repeat 3hr troponin, continued monitoring.  Likely discharge home with cardiology follow up.   6:28 PM Discussed findings to this point and plan with patient.  Pt verbalizes understanding and agrees with plan.  Pt declines IV.    8:54 PM Patient ambulated without any CP or SOB.  CP is completely relieved.  Pt reassessed by Dr Christy Gentles.  Plan is for d/c home with PCP and cardiology follow up.    1. Chest pain     MDM  Pt with constant left sided chest pain at rest, reproducible by palpation.  EKG without ischemic changes and no significant changes from prior.  Pain relieved with IM morphine.  Repeat troponins negative.  Pt has been seen for similar pain previously, r/o for ACS as inpatient, pt states this is similar to prior costochondritis.  No cardiology workup done as an outpatient.  Pt also seen by Dr Christy Gentles.  Doubt ACS, PE.  Labs remarkable only for chronic anemia.  Pt d/c home with PCP and cardiology follow up.  Discussed all results with patient.  Pt given return precautions.  Pt verbalizes understanding and agrees with plan.           [place], [place] 06/09/12 [phone]

## 2012-06-08 NOTE — ED Notes (Signed)
Placed call to IV Team

## 2012-06-12 NOTE — ED Provider Notes (Signed)
Medical screening examination/treatment/procedure(s) were conducted as a shared visit with non-physician practitioner(s) and myself.  I personally evaluated the patient during the encounter  Pt well appearing, low suspicion for ACS given her history/exam.  EKG reviewed and shows stable LBBB.  Stable for d/c home.  Pt agreeable and all questions answered  Sharyon Cable, MD 06/12/12 1946

## 2012-07-31 ENCOUNTER — Other Ambulatory Visit: Payer: Self-pay | Admitting: Internal Medicine

## 2012-07-31 DIAGNOSIS — Z1231 Encounter for screening mammogram for malignant neoplasm of breast: Secondary | ICD-10-CM

## 2012-08-17 ENCOUNTER — Ambulatory Visit (HOSPITAL_COMMUNITY)
Admission: RE | Admit: 2012-08-17 | Discharge: 2012-08-17 | Disposition: A | Discharge status: Home / Self Care | Payer: Medicare Other | Source: Ambulatory Visit | Attending: Internal Medicine | Admitting: Internal Medicine

## 2012-08-17 DIAGNOSIS — Z1231 Encounter for screening mammogram for malignant neoplasm of breast: Secondary | ICD-10-CM | POA: Insufficient documentation

## 2012-09-06 ENCOUNTER — Other Ambulatory Visit: Payer: Self-pay | Admitting: Internal Medicine

## 2012-09-06 DIAGNOSIS — R928 Other abnormal and inconclusive findings on diagnostic imaging of breast: Secondary | ICD-10-CM

## 2012-09-12 ENCOUNTER — Ambulatory Visit
Admission: RE | Admit: 2012-09-12 | Discharge: 2012-09-12 | Disposition: A | Discharge status: Home / Self Care | Payer: Medicare Other | Source: Ambulatory Visit | Attending: Internal Medicine | Admitting: Internal Medicine

## 2012-09-12 ENCOUNTER — Other Ambulatory Visit: Payer: Self-pay | Admitting: Internal Medicine

## 2012-09-12 DIAGNOSIS — R928 Other abnormal and inconclusive findings on diagnostic imaging of breast: Secondary | ICD-10-CM

## 2013-01-02 ENCOUNTER — Other Ambulatory Visit: Payer: Self-pay | Admitting: Internal Medicine

## 2013-01-02 DIAGNOSIS — M79605 Pain in left leg: Secondary | ICD-10-CM

## 2013-01-09 ENCOUNTER — Ambulatory Visit
Admission: RE | Admit: 2013-01-09 | Discharge: 2013-01-09 | Disposition: A | Discharge status: Home / Self Care | Payer: Medicare Other | Source: Ambulatory Visit | Attending: Internal Medicine | Admitting: Internal Medicine

## 2013-01-09 DIAGNOSIS — M79604 Pain in right leg: Secondary | ICD-10-CM

## 2013-06-11 ENCOUNTER — Other Ambulatory Visit: Payer: Self-pay | Admitting: Gastroenterology

## 2013-06-13 ENCOUNTER — Encounter (HOSPITAL_COMMUNITY): Payer: Self-pay | Admitting: Pharmacy Technician

## 2013-06-13 ENCOUNTER — Encounter (HOSPITAL_COMMUNITY): Payer: Self-pay | Admitting: *Deleted

## 2013-07-03 ENCOUNTER — Ambulatory Visit (HOSPITAL_COMMUNITY): Payer: Medicare Other | Admitting: *Deleted

## 2013-07-03 ENCOUNTER — Encounter (HOSPITAL_COMMUNITY)
Admission: RE | Disposition: A | Discharge status: Home / Self Care | Payer: Self-pay | Source: Ambulatory Visit | Attending: Gastroenterology

## 2013-07-03 ENCOUNTER — Encounter (HOSPITAL_COMMUNITY): Payer: Self-pay | Admitting: *Deleted

## 2013-07-03 ENCOUNTER — Ambulatory Visit (HOSPITAL_COMMUNITY)
Admission: RE | Admit: 2013-07-03 | Discharge: 2013-07-03 | Disposition: A | Discharge status: Home / Self Care | Payer: Medicare Other | Source: Ambulatory Visit | Attending: Gastroenterology | Admitting: Gastroenterology

## 2013-07-03 ENCOUNTER — Encounter (HOSPITAL_COMMUNITY): Payer: Medicare Other | Admitting: *Deleted

## 2013-07-03 DIAGNOSIS — Z96659 Presence of unspecified artificial knee joint: Secondary | ICD-10-CM | POA: Insufficient documentation

## 2013-07-03 DIAGNOSIS — Z1211 Encounter for screening for malignant neoplasm of colon: Secondary | ICD-10-CM | POA: Insufficient documentation

## 2013-07-03 DIAGNOSIS — M1990 Unspecified osteoarthritis, unspecified site: Secondary | ICD-10-CM | POA: Insufficient documentation

## 2013-07-03 DIAGNOSIS — E119 Type 2 diabetes mellitus without complications: Secondary | ICD-10-CM | POA: Insufficient documentation

## 2013-07-03 DIAGNOSIS — I10 Essential (primary) hypertension: Secondary | ICD-10-CM | POA: Insufficient documentation

## 2013-07-03 DIAGNOSIS — E7800 Pure hypercholesterolemia, unspecified: Secondary | ICD-10-CM | POA: Insufficient documentation

## 2013-07-03 HISTORY — PX: COLONOSCOPY WITH PROPOFOL: SHX5780

## 2013-07-03 HISTORY — DX: Anemia, unspecified: D64.9

## 2013-07-03 LAB — GLUCOSE, CAPILLARY: Glucose-Capillary: 136 mg/dL — ABNORMAL HIGH (ref 70–99)

## 2013-07-03 LAB — POCT I-STAT 4, (NA,K, GLUC, HGB,HCT)
Glucose, Bld: 150 mg/dL — ABNORMAL HIGH (ref 70–99)
Sodium: 140 mEq/L (ref 135–145)

## 2013-07-03 SURGERY — COLONOSCOPY WITH PROPOFOL
Anesthesia: Monitor Anesthesia Care

## 2013-07-03 MED ORDER — LACTATED RINGERS IV SOLN
INTRAVENOUS | Status: DC
  Administered 2013-07-03: 1000 mL via INTRAVENOUS

## 2013-07-03 MED ORDER — LACTATED RINGERS IV SOLN
INTRAVENOUS | Status: DC | PRN
  Administered 2013-07-03: 10:00:00 via INTRAVENOUS

## 2013-07-03 MED ORDER — PROPOFOL INFUSION 10 MG/ML OPTIME
INTRAVENOUS | Status: DC | PRN
  Administered 2013-07-03: 180 ug/kg/min via INTRAVENOUS

## 2013-07-03 MED ORDER — FENTANYL CITRATE 0.05 MG/ML IJ SOLN: 25.0000 ug | INTRAMUSCULAR | Status: DC | PRN

## 2013-07-03 MED ORDER — PROPOFOL 10 MG/ML IV BOLUS
INTRAVENOUS | Status: DC | PRN
  Administered 2013-07-03: 50 mg via INTRAVENOUS

## 2013-07-03 MED ORDER — SODIUM CHLORIDE 0.9 % IV SOLN: INTRAVENOUS | Status: DC

## 2013-07-03 MED ORDER — MIDAZOLAM HCL 5 MG/5ML IJ SOLN
INTRAMUSCULAR | Status: DC | PRN
  Administered 2013-07-03 (×2): 1 mg via INTRAVENOUS

## 2013-07-03 MED ORDER — GLYCOPYRROLATE 0.2 MG/ML IJ SOLN
INTRAMUSCULAR | Status: DC | PRN
  Administered 2013-07-03: 0.2 mg via INTRAVENOUS

## 2013-07-03 MED ORDER — KETAMINE HCL 10 MG/ML IJ SOLN
INTRAMUSCULAR | Status: DC | PRN
  Administered 2013-07-03: 12 mg via INTRAVENOUS
  Administered 2013-07-03: 13 mg via INTRAVENOUS

## 2013-07-03 SURGICAL SUPPLY — 22 items

## 2013-07-03 NOTE — Anesthesia Postprocedure Evaluation (Signed)
  Anesthesia Post-op Note  Patient: Krista Clarke  Procedure(s) Performed: Procedure(s) (LRB): COLONOSCOPY WITH PROPOFOL (N/A)  Patient Location: PACU  Anesthesia Type: MAC  Level of Consciousness: awake and alert   Airway and Oxygen Therapy: Patient Spontanous Breathing  Post-op Pain: mild  Post-op Assessment: Post-op Vital signs reviewed, Patient's Cardiovascular Status Stable, Respiratory Function Stable, Patent Airway and No signs of Nausea or vomiting  Last Vitals:  Filed Vitals:   07/03/13 1153  BP: 168/80  Pulse: 72  Temp: 36.7 C  Resp: 18    Post-op Vital Signs: stable   Complications: No apparent anesthesia complications

## 2013-07-03 NOTE — Preoperative (Signed)
Beta Blockers   Reason not to administer Beta Blockers:Not Applicable, took BB this am 

## 2013-07-03 NOTE — Anesthesia Preprocedure Evaluation (Addendum)
Anesthesia Evaluation  Patient identified by MRN, date of birth, ID band Patient awake    Reviewed: Allergy & Precautions, H&P , NPO status , Patient's Chart, lab work & pertinent test results, reviewed documented beta blocker date and time   Airway Mallampati: II TM Distance: >3 FB Neck ROM: full    Dental  (+) Missing and Dental Advisory Given All upper front teeth are missing:   Pulmonary neg pulmonary ROS,  breath sounds clear to auscultation  Pulmonary exam normal       Cardiovascular Exercise Tolerance: Good hypertension, Pt. on home beta blockers and Pt. on medications + pacemaker Rhythm:regular Rate:Normal  LBBB   Neuro/Psych negative neurological ROS  negative psych ROS   GI/Hepatic negative GI ROS, Neg liver ROS,   Endo/Other  diabetes, Well Controlled, Type 2, Oral Hypoglycemic Agents  Renal/GU negative Renal ROS  negative genitourinary   Musculoskeletal   Abdominal   Peds  Hematology negative hematology ROS (+)   Anesthesia Other Findings   Reproductive/Obstetrics negative OB ROS                          Anesthesia Physical Anesthesia Plan  ASA: III  Anesthesia Plan: MAC   Post-op Pain Management:    Induction:   Airway Management Planned:   Additional Equipment:   Intra-op Plan:   Post-operative Plan:   Informed Consent: I have reviewed the patients History and Physical, chart, labs and discussed the procedure including the risks, benefits and alternatives for the proposed anesthesia with the patient or authorized representative who has indicated his/her understanding and acceptance.   Dental Advisory Given  Plan Discussed with: CRNA and Surgeon  Anesthesia Plan Comments:         Anesthesia Quick Evaluation

## 2013-07-03 NOTE — Transfer of Care (Signed)
Immediate Anesthesia Transfer of Care Note  Patient: Krista Clarke  Procedure(s) Performed: Procedure(s): COLONOSCOPY WITH PROPOFOL (N/A)  Patient Location: PACU. ENDO  Anesthesia Type:MAC  Level of Consciousness: Patient easily awoken, sedated, comfortable, cooperative, following commands, responds to stimulation.   Airway & Oxygen Therapy: Patient spontaneously breathing, ventilating well, oxygen via simple oxygen mask.  Post-op Assessment: Report given to PACU RN, vital signs reviewed and stable, moving all extremities.   Post vital signs: Reviewed and stable.  Complications: No apparent anesthesia complications

## 2013-07-03 NOTE — Op Note (Signed)
Procedure: Screening colonoscopy. Normal screening colonoscopy in June 2004.  Endoscopist: Earle Gell  Premedication: Propofol administered by anesthesia  Procedure: The patient was placed in the left lateral decubitus position. Anal inspection and digital rectal exam were normal. The Pentax pediatric colonoscope was into into the rectum and easily advanced to the cecum. A normal-appearing ileocecal valve and appendiceal orifice were identified. Colonic preparation for the exam today was good.  Rectum. Normal. Retroflexed view of the distal rectum normal.  Sigmoid colon and descending colon. Normal  Splenic flexure. Normal  Transverse colon. Normal  Hepatic flexure. Normal  Ascending colon. Normal  Cecum and ileocecal valve. Normal  Assessment: Normal screening proctocolonoscopy to the cecum.

## 2013-07-03 NOTE — H&P (Signed)
  Procedure: Screening colonoscopy  History: The patient is a 72 year old female born March 16, 1941. The patient underwent a normal screening colonoscopy in June 2004.  The patient is scheduled to undergo a repeat screening colonoscopy today.  Medication allergies:. Lovastatin. Gabapentin. Amitriptyline. Actos.  Past medical history: Hypertension. Hypercholesterolemia. Type 2 diabetes mellitus. Obesity. Psoriasis. Osteoarthritis. Gout. Spinal stenosis. Microcytic anemia of chronic disease. Vitamin D. deficiency. Left bundle branch block pattern by electrocardiogram. Hysterectomy. Bilateral knee replacement surgeries. Urinary bladder surgery.  Exam: The patient is alert and lying comfortably on the endoscopy stretcher. Lungs are clear to auscultation. Cardiac exam reveals a regular rhythm. Abdomen is soft and nontender to palpation.  Plan: Proceed with screening colonoscopy using propofol sedation.

## 2013-07-04 ENCOUNTER — Encounter (HOSPITAL_COMMUNITY): Payer: Self-pay | Admitting: Gastroenterology

## 2013-09-18 ENCOUNTER — Emergency Department (HOSPITAL_COMMUNITY): Payer: Medicare Other

## 2013-09-18 ENCOUNTER — Encounter (HOSPITAL_COMMUNITY): Payer: Self-pay | Admitting: Emergency Medicine

## 2013-09-18 ENCOUNTER — Emergency Department (HOSPITAL_COMMUNITY)
Admission: EM | Admit: 2013-09-18 | Discharge: 2013-09-18 | Disposition: A | Discharge status: Home / Self Care | Payer: Medicare Other | Attending: Emergency Medicine | Admitting: Emergency Medicine

## 2013-09-18 DIAGNOSIS — Z79899 Other long term (current) drug therapy: Secondary | ICD-10-CM | POA: Insufficient documentation

## 2013-09-18 DIAGNOSIS — Z7982 Long term (current) use of aspirin: Secondary | ICD-10-CM | POA: Insufficient documentation

## 2013-09-18 DIAGNOSIS — E7800 Pure hypercholesterolemia, unspecified: Secondary | ICD-10-CM | POA: Insufficient documentation

## 2013-09-18 DIAGNOSIS — R112 Nausea with vomiting, unspecified: Secondary | ICD-10-CM | POA: Insufficient documentation

## 2013-09-18 DIAGNOSIS — E119 Type 2 diabetes mellitus without complications: Secondary | ICD-10-CM | POA: Insufficient documentation

## 2013-09-18 DIAGNOSIS — R42 Dizziness and giddiness: Secondary | ICD-10-CM | POA: Insufficient documentation

## 2013-09-18 DIAGNOSIS — M129 Arthropathy, unspecified: Secondary | ICD-10-CM | POA: Insufficient documentation

## 2013-09-18 DIAGNOSIS — D649 Anemia, unspecified: Secondary | ICD-10-CM | POA: Insufficient documentation

## 2013-09-18 DIAGNOSIS — I10 Essential (primary) hypertension: Secondary | ICD-10-CM | POA: Insufficient documentation

## 2013-09-18 LAB — CBC WITH DIFFERENTIAL/PLATELET
BASOS PCT: 0 % (ref 0–1)
Basophils Absolute: 0 10*3/uL (ref 0.0–0.1)
EOS ABS: 0 10*3/uL (ref 0.0–0.7)
EOS PCT: 1 % (ref 0–5)
HEMATOCRIT: 33.6 % — AB (ref 36.0–46.0)
Hemoglobin: 10.7 g/dL — ABNORMAL LOW (ref 12.0–15.0)
LYMPHS ABS: 1 10*3/uL (ref 0.7–4.0)
Lymphocytes Relative: 12 % (ref 12–46)
MCH: 24.9 pg — ABNORMAL LOW (ref 26.0–34.0)
MCHC: 31.8 g/dL (ref 30.0–36.0)
MCV: 78.1 fL (ref 78.0–100.0)
MONO ABS: 0.4 10*3/uL (ref 0.1–1.0)
Monocytes Relative: 5 % (ref 3–12)
Neutro Abs: 6.7 10*3/uL (ref 1.7–7.7)
Neutrophils Relative %: 83 % — ABNORMAL HIGH (ref 43–77)
Platelets: 302 10*3/uL (ref 150–400)
RBC: 4.3 MIL/uL (ref 3.87–5.11)
RDW: 14.8 % (ref 11.5–15.5)
WBC: 8.1 10*3/uL (ref 4.0–10.5)

## 2013-09-18 LAB — URINALYSIS, ROUTINE W REFLEX MICROSCOPIC
Bilirubin Urine: NEGATIVE
Glucose, UA: NEGATIVE mg/dL
Hgb urine dipstick: NEGATIVE
Ketones, ur: NEGATIVE mg/dL
LEUKOCYTES UA: NEGATIVE
Nitrite: NEGATIVE
PH: 8 (ref 5.0–8.0)
PROTEIN: NEGATIVE mg/dL
Specific Gravity, Urine: 1.016 (ref 1.005–1.030)
Urobilinogen, UA: 0.2 mg/dL (ref 0.0–1.0)

## 2013-09-18 LAB — COMPREHENSIVE METABOLIC PANEL
ALBUMIN: 4 g/dL (ref 3.5–5.2)
ALT: 7 U/L (ref 0–35)
AST: 13 U/L (ref 0–37)
Alkaline Phosphatase: 76 U/L (ref 39–117)
BUN: 18 mg/dL (ref 6–23)
CO2: 26 mEq/L (ref 19–32)
CREATININE: 0.72 mg/dL (ref 0.50–1.10)
Calcium: 10.2 mg/dL (ref 8.4–10.5)
Chloride: 96 mEq/L (ref 96–112)
GFR calc Af Amer: >90 mL/min (ref >90)
GFR calc non Af Amer: 83 mL/min — ABNORMAL LOW (ref >90)
Glucose, Bld: 150 mg/dL — ABNORMAL HIGH (ref 70–99)
POTASSIUM: 4.2 meq/L (ref 3.7–5.3)
Sodium: 137 mEq/L (ref 137–147)
TOTAL PROTEIN: 8.1 g/dL (ref 6.0–8.3)
Total Bilirubin: 0.2 mg/dL — ABNORMAL LOW (ref 0.3–1.2)

## 2013-09-18 LAB — GLUCOSE, CAPILLARY: GLUCOSE-CAPILLARY: 142 mg/dL — AB (ref 70–99)

## 2013-09-18 LAB — TROPONIN I: Troponin I: <0.3 ng/mL (ref <0.30)

## 2013-09-18 LAB — PRO B NATRIURETIC PEPTIDE: PRO B NATRI PEPTIDE: 296.9 pg/mL — AB (ref 0–125)

## 2013-09-18 MED ORDER — SODIUM CHLORIDE 0.9 % IV BOLUS (SEPSIS)
250.0000 mL | Freq: Once | INTRAVENOUS | Status: AC
  Administered 2013-09-18: 250 mL via INTRAVENOUS

## 2013-09-18 MED ORDER — MECLIZINE HCL 25 MG PO TABS: 25.0000 mg | ORAL_TABLET | Freq: Once | ORAL | Status: DC

## 2013-09-18 MED ORDER — MECLIZINE HCL 25 MG PO TABS: 25.0000 mg | ORAL_TABLET | Freq: Three times a day (TID) | ORAL | Status: DC | PRN

## 2013-09-18 MED ORDER — SODIUM CHLORIDE 0.9 % IV SOLN
INTRAVENOUS | Status: DC
Start: 2013-09-19 — End: 2013-09-19

## 2013-09-18 MED ORDER — ONDANSETRON 4 MG PO TBDP: 4.0000 mg | ORAL_TABLET | Freq: Three times a day (TID) | ORAL | Status: DC | PRN

## 2013-09-18 MED ORDER — ONDANSETRON HCL 4 MG/2ML IJ SOLN: 4.0000 mg | Freq: Once | INTRAMUSCULAR | Status: DC

## 2013-09-18 MED ORDER — ONDANSETRON HCL 4 MG/2ML IJ SOLN
4.0000 mg | Freq: Once | INTRAMUSCULAR | Status: AC
  Administered 2013-09-18: 4 mg via INTRAVENOUS
  Filled 2013-09-18: qty 2

## 2013-09-18 NOTE — ED Notes (Signed)
Provided pt with a [place] sandwich and a cup of water. Pt sitting on bedside eating meal.

## 2013-09-18 NOTE — ED Notes (Signed)
Pt from home, c/o sudden onset N/V w/ one intence of emesis. Pt also states slight dizziness. Pt denies fevers or chills. States she feels nauseated at this time. NSD

## 2013-09-18 NOTE — ED Notes (Signed)
Pt was able to ambulate to the restroom with no assistance.

## 2013-09-18 NOTE — ED Notes (Signed)
Pt states relief with ice chips, deniea nausea at this time. Pt informed she can have Antivert when needed

## 2013-09-18 NOTE — ED Notes (Signed)
Pt requested ice chips instead of medicine.

## 2013-09-18 NOTE — ED Provider Notes (Signed)
CSN: [id number]     Arrival date & time 09/18/13  1541 History   First MD Initiated Contact with Patient 09/18/13 1541     Chief Complaint  Patient presents with  . Weakness   (Consider location/radiation/quality/duration/timing/severity/associated sxs/prior Treatment) Patient is a 73 y.o. female presenting with weakness. The history is provided by the patient and the EMS personnel.  Weakness Pertinent negatives include no chest pain, no headaches and no shortness of breath.   patient brought in from home. Patient had sudden onset of dizziness making her gait unsteady and some room spinning. Patient lives by herself. But does have family members around. Symptoms started 2-3 hours prior to arrival. Patient vomited once has had persistent nausea no diarrhea. Some slight shortness of breath and no fever as stated there is room spinning. No belly pain.  Past Medical History  Diagnosis Date  . Hypertension   . Diabetes mellitus without complication   . Hypercholesteremia   . Arthritis   . Anemia     takes iron daily   Past Surgical History  Procedure Laterality Date  . Tubal ligation    . Joint replacement Bilateral     '99  . Abdominal hysterectomy    . Colonoscopy with propofol N/A 07/03/2013    Procedure: COLONOSCOPY WITH PROPOFOL;  Surgeon: Garlan Fair, MD;  Location: [facility];  Service: Endoscopy;  Laterality: N/A;   History reviewed. No pertinent family history. History  Substance Use Topics  . Smoking status: Never Smoker   . Smokeless tobacco: Not on file  . Alcohol Use: No   OB History   Grav Para Term Preterm Abortions TAB SAB Ect Mult Living                 Review of Systems  Constitutional: Negative for fever and chills.  HENT: Negative for congestion.   Eyes: Negative for redness.  Respiratory: Negative for shortness of breath.   Cardiovascular: Negative for chest pain.  Gastrointestinal: Positive for nausea and vomiting.  Genitourinary: Negative for  dysuria.  Musculoskeletal: Negative for back pain.  Skin: Negative for rash.  Neurological: Positive for dizziness. Negative for weakness and headaches.  Hematological: Does not bruise/bleed easily.  Psychiatric/Behavioral: Negative for confusion.    Allergies  Review of patient's allergies indicates no known allergies.  Home Medications   Current Outpatient Rx  Name  Route  Sig  Dispense  Refill  . acetaminophen (TYLENOL) 500 MG tablet   Oral   Take 1,000 mg by mouth every 6 (six) hours as needed. For pain         . allopurinol (ZYLOPRIM) 100 MG tablet   Oral   Take 100 mg by mouth every morning.          Marland Kitchen amLODipine (NORVASC) 2.5 MG tablet   Oral   Take 2.5 mg by mouth at bedtime.          Marland Kitchen aspirin EC 81 MG tablet   Oral   Take 81 mg by mouth every evening.          . Cholecalciferol (VITAMIN D PO)   Oral   Take 1 capsule by mouth daily.         . Ferrous Sulfate 27 MG TABS   Oral   Take 27 mg by mouth 2 (two) times daily.          . hydrochlorothiazide (MICROZIDE) 12.5 MG capsule   Oral   Take 12.5 mg by mouth every evening.          Marland Kitchen  irbesartan (AVAPRO) 150 MG tablet   Oral   Take 150 mg by mouth every evening.          . metFORMIN (GLUCOPHAGE) 1000 MG tablet   Oral   Take 1,000 mg by mouth 2 (two) times daily with a meal.         . metoprolol (LOPRESSOR) 50 MG tablet   Oral   Take 50 mg by mouth 2 (two) times daily.         . Multiple Vitamins-Minerals (PRESERVISION AREDS 2 PO)   Oral   Take 1 capsule by mouth 2 (two) times daily.         Marland Kitchen OVER THE COUNTER MEDICATION   Topical   Apply 1 application topically 2 (two) times daily as needed (For pain.). Arthritis Cream         . OVER THE COUNTER MEDICATION   Oral   Take 1 tablet by mouth 2 (two) times daily. Calcium Citrate with Vitamin D 630mg          . Salicylic Acid (PSORIASIS MEDICATED EX)   Apply externally   Apply 1 application topically daily as needed (for  hands).         . sitaGLIPtin (JANUVIA) 100 MG tablet   Oral   Take 50 mg by mouth daily as needed (takes as needed in evenings).          . meclizine (ANTIVERT) 25 MG tablet   Oral   Take 1 tablet (25 mg total) by mouth 3 (three) times daily as needed for dizziness.   30 tablet   0   . ondansetron (ZOFRAN ODT) 4 MG disintegrating tablet   Oral   Take 1 tablet (4 mg total) by mouth every 8 (eight) hours as needed for nausea or vomiting.   10 tablet   1    BP 155/64  Pulse 78  Temp(Src) 97.7 F (36.5 C) (Oral)  Resp 15  SpO2 100% Physical Exam  Nursing note and vitals reviewed. Constitutional: She is oriented to person, place, and time. She appears well-developed and well-nourished. No distress.  HENT:  Head: Normocephalic and atraumatic.  Mouth/Throat: Oropharynx is clear and moist.  Eyes: Conjunctivae and EOM are normal. Pupils are equal, round, and reactive to light.  Neck: Normal range of motion.  Cardiovascular: Normal rate, regular rhythm and normal heart sounds.   Pulmonary/Chest: Effort normal and breath sounds normal. No respiratory distress.  Abdominal: Soft. Bowel sounds are normal. There is no tenderness.  Musculoskeletal: Normal range of motion.  Neurological: She is alert and oriented to person, place, and time. No cranial nerve deficit. She exhibits normal muscle tone. Coordination normal.  Skin: Skin is warm. No rash noted.    ED Course  Procedures (including critical care time) Labs Review Labs Reviewed  CBC WITH DIFFERENTIAL - Abnormal; Notable for the following:    Hemoglobin 10.7 (*)    HCT 33.6 (*)    MCH 24.9 (*)    Neutrophils Relative % 83 (*)    All other components within normal limits  COMPREHENSIVE METABOLIC PANEL - Abnormal; Notable for the following:    Glucose, Bld 150 (*)    Total Bilirubin 0.2 (*)    GFR calc non Af Amer 83 (*)    All other components within normal limits  PRO B NATRIURETIC PEPTIDE - Abnormal; Notable for the  following:    Pro B Natriuretic peptide (BNP) 296.9 (*)    All other components within normal limits  GLUCOSE, CAPILLARY - Abnormal; Notable for the following:    Glucose-Capillary 142 (*)    All other components within normal limits  URINALYSIS, ROUTINE W REFLEX MICROSCOPIC  TROPONIN I   Results for orders placed during the hospital encounter of 09/18/13  CBC WITH DIFFERENTIAL      Result Value Range   WBC 8.1  4.0 - 10.5 K/uL   RBC 4.30  3.87 - 5.11 MIL/uL   Hemoglobin 10.7 (*) 12.0 - 15.0 g/dL   HCT 33.6 (*) 36.0 - 46.0 %   MCV 78.1  78.0 - 100.0 fL   MCH 24.9 (*) 26.0 - 34.0 pg   MCHC 31.8  30.0 - 36.0 g/dL   RDW 14.8  11.5 - 15.5 %   Platelets 302  150 - 400 K/uL   Neutrophils Relative % 83 (*) 43 - 77 %   Neutro Abs 6.7  1.7 - 7.7 K/uL   Lymphocytes Relative 12  12 - 46 %   Lymphs Abs 1.0  0.7 - 4.0 K/uL   Monocytes Relative 5  3 - 12 %   Monocytes Absolute 0.4  0.1 - 1.0 K/uL   Eosinophils Relative 1  0 - 5 %   Eosinophils Absolute 0.0  0.0 - 0.7 K/uL   Basophils Relative 0  0 - 1 %   Basophils Absolute 0.0  0.0 - 0.1 K/uL  COMPREHENSIVE METABOLIC PANEL      Result Value Range   Sodium 137  137 - 147 mEq/L   Potassium 4.2  3.7 - 5.3 mEq/L   Chloride 96  96 - 112 mEq/L   CO2 26  19 - 32 mEq/L   Glucose, Bld 150 (*) 70 - 99 mg/dL   BUN 18  6 - 23 mg/dL   Creatinine, Ser 0.72  0.50 - 1.10 mg/dL   Calcium 10.2  8.4 - 10.5 mg/dL   Total Protein 8.1  6.0 - 8.3 g/dL   Albumin 4.0  3.5 - 5.2 g/dL   AST 13  0 - 37 U/L   ALT 7  0 - 35 U/L   Alkaline Phosphatase 76  39 - 117 U/L   Total Bilirubin 0.2 (*) 0.3 - 1.2 mg/dL   GFR calc non Af Amer 83 (*) >90 mL/min   GFR calc Af Amer >90  >90 mL/min  URINALYSIS, ROUTINE W REFLEX MICROSCOPIC      Result Value Range   Color, Urine YELLOW  YELLOW   APPearance CLEAR  CLEAR   Specific Gravity, Urine 1.016  1.005 - 1.030   pH 8.0  5.0 - 8.0   Glucose, UA NEGATIVE  NEGATIVE mg/dL   Hgb urine dipstick NEGATIVE  NEGATIVE    Bilirubin Urine NEGATIVE  NEGATIVE   Ketones, ur NEGATIVE  NEGATIVE mg/dL   Protein, ur NEGATIVE  NEGATIVE mg/dL   Urobilinogen, UA 0.2  0.0 - 1.0 mg/dL   Nitrite NEGATIVE  NEGATIVE   Leukocytes, UA NEGATIVE  NEGATIVE  PRO B NATRIURETIC PEPTIDE      Result Value Range   Pro B Natriuretic peptide (BNP) 296.9 (*) 0 - 125 pg/mL  TROPONIN I      Result Value Range   Troponin I <0.30  <0.30 ng/mL  GLUCOSE, CAPILLARY      Result Value Range   Glucose-Capillary 142 (*) 70 - 99 mg/dL    Imaging Review Ct Head Wo Contrast  09/18/2013   CLINICAL DATA:  Weakness, vomiting  EXAM: CT HEAD WITHOUT CONTRAST  TECHNIQUE: Contiguous axial images were obtained from the base of the skull through the vertex without intravenous contrast.  COMPARISON:  None.  FINDINGS: No skull fracture is noted. Paranasal sinuses and mastoid air cells are unremarkable. No intracranial hemorrhage, mass effect or midline shift.  No acute cortical infarction. No mass lesion is noted on this unenhanced scan. Mild cerebral atrophy. Bilateral basal ganglia punctate calcifications are noted.  IMPRESSION: No acute intracranial abnormality.  Mild cerebral atrophy.   Electronically Signed   By: Lahoma Crocker M.D.   On: 09/18/2013 16:39   Dg Chest Port 1 View  09/18/2013   CLINICAL DATA:  Dizziness, weakness  EXAM: PORTABLE CHEST - 1 VIEW  COMPARISON:  Prior radiograph from 06/08/2012  FINDINGS: The patient is slightly rotated to the right. The cardiac and mediastinal silhouettes are stable in size and contour, and remain within normal limits.  The lungs are normally inflated. No airspace consolidation, pleural effusion, or pulmonary edema is identified. There is no pneumothorax.  No acute osseous abnormality identified. 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  IMPRESSION: No acute cardiopulmonary abnormality.   Electronically Signed   By: Jeannine Boga M.D.   On: 09/18/2013 16:33    EKG Interpretation     Date/Time:  Tuesday September 18 2013 15:43:22 EST Ventricular Rate:  59 PR Interval:  192 QRS Duration: 154 QT Interval:  460 QTC Calculation: 456 R Axis:   6 Text Interpretation:  Age not entered, assumed to be  73 years old for purpose of ECG interpretation Sinus rhythm Left bundle branch block Sinus rhythm Left bundle branch block No significant change since last tracing Abnormal ekg Confirmed by Carmin Muskrat  MD [phone]) on 09/18/2013 3:47:44 PM Also confirmed by Rogene Houston  MD, Fox ([id number])  on 09/18/2013 3:50:04 PM            MDM   1. Vertigo    Patient with marked improvement in the emergency department with antinausea medicine and antivertigo to patient ambulating no symptoms at all. Head CT negative labs without any significant abnormalities no leukocytosis. Symptom onset was sudden associated with nausea and vomiting dizziness with room spinning no fever or chills. Nausea has persisted until she received meds. Doubt that this is a stroke related vertigo suspect it is viral based. Will continue antivertigo antinausea medicine and followup with her primary care Dr.    Mervin Kung, MD 09/18/13 2133

## 2013-09-18 NOTE — ED Notes (Signed)
Attempted IV unable to gain IV access paged IV Team stated will attempt IV access and blood draw.

## 2013-09-18 NOTE — ED Notes (Signed)
Pt sitting on side of bed, states she is not dizzy or nauseated.

## 2013-09-18 NOTE — Discharge Instructions (Signed)
Dizziness  Dizziness means you feel unsteady or lightheaded. You might feel like you are going to pass out (faint). HOME CARE   Drink enough fluids to keep your pee (urine) clear or pale yellow.  Take your medicines exactly as told by your doctor. If you take blood pressure medicine, always stand up slowly from the lying or sitting position. Hold on to something to steady yourself.  If you need to stand in one place for a long time, move your legs often. Tighten and relax your leg muscles.  Have someone stay with you until you feel okay.  Do not drive or use heavy machinery if you feel dizzy.  Do not drink alcohol. GET HELP RIGHT AWAY IF:   You feel dizzy or lightheaded and it gets worse.  You feel sick to your stomach (nauseous), or you throw up (vomit).  You have trouble talking or walking.  You feel weak or have trouble using your arms, hands, or legs.  You cannot think clearly or have trouble forming sentences.  You have chest pain, belly (abdominal) pain, sweating, or you are short of breath.  Your vision changes.  You are bleeding.  You have problems from your medicine that seem to be getting worse. MAKE SURE YOU:   Understand these instructions.  Will watch your condition.  Will get help right away if you are not doing well or get worse. Document Released: 08/05/2011 Document Revised: 11/08/2011 Document Reviewed: 08/05/2011 [facility] Patient Information 2014 [place], [place].  Literature feeling much better. Suspect symptoms do to a virus. Take the Antivert as needed for recurrent room spinning. In the antinausea medicine as needed for nausea and vomiting. Return for any newer worse symptoms. Followup with your doctor if the dizziness and vertigo is not improved in the next one to 2 days.

## 2013-12-04 ENCOUNTER — Encounter (HOSPITAL_COMMUNITY): Payer: Self-pay | Admitting: Emergency Medicine

## 2013-12-04 ENCOUNTER — Emergency Department (HOSPITAL_COMMUNITY)
Admission: EM | Admit: 2013-12-04 | Discharge: 2013-12-05 | Disposition: A | Discharge status: Home / Self Care | Payer: Medicare Other | Attending: Emergency Medicine | Admitting: Emergency Medicine

## 2013-12-04 ENCOUNTER — Emergency Department (HOSPITAL_COMMUNITY): Payer: Medicare Other

## 2013-12-04 DIAGNOSIS — R079 Chest pain, unspecified: Secondary | ICD-10-CM

## 2013-12-04 DIAGNOSIS — Z79899 Other long term (current) drug therapy: Secondary | ICD-10-CM | POA: Insufficient documentation

## 2013-12-04 DIAGNOSIS — K219 Gastro-esophageal reflux disease without esophagitis: Secondary | ICD-10-CM | POA: Insufficient documentation

## 2013-12-04 DIAGNOSIS — D649 Anemia, unspecified: Secondary | ICD-10-CM | POA: Insufficient documentation

## 2013-12-04 DIAGNOSIS — I10 Essential (primary) hypertension: Secondary | ICD-10-CM | POA: Insufficient documentation

## 2013-12-04 DIAGNOSIS — Z7982 Long term (current) use of aspirin: Secondary | ICD-10-CM | POA: Insufficient documentation

## 2013-12-04 DIAGNOSIS — E119 Type 2 diabetes mellitus without complications: Secondary | ICD-10-CM | POA: Insufficient documentation

## 2013-12-04 DIAGNOSIS — R071 Chest pain on breathing: Secondary | ICD-10-CM | POA: Insufficient documentation

## 2013-12-04 DIAGNOSIS — M129 Arthropathy, unspecified: Secondary | ICD-10-CM | POA: Insufficient documentation

## 2013-12-04 DIAGNOSIS — E7800 Pure hypercholesterolemia, unspecified: Secondary | ICD-10-CM | POA: Insufficient documentation

## 2013-12-04 LAB — CBC
HEMATOCRIT: 31 % — AB (ref 36.0–46.0)
Hemoglobin: 10.1 g/dL — ABNORMAL LOW (ref 12.0–15.0)
MCH: 24.7 pg — ABNORMAL LOW (ref 26.0–34.0)
MCHC: 32.6 g/dL (ref 30.0–36.0)
MCV: 75.8 fL — AB (ref 78.0–100.0)
Platelets: 269 10*3/uL (ref 150–400)
RBC: 4.09 MIL/uL (ref 3.87–5.11)
RDW: 14.8 % (ref 11.5–15.5)
WBC: 6.5 10*3/uL (ref 4.0–10.5)

## 2013-12-04 LAB — BASIC METABOLIC PANEL
BUN: 24 mg/dL — AB (ref 6–23)
CALCIUM: 9.8 mg/dL (ref 8.4–10.5)
CO2: 27 mEq/L (ref 19–32)
CREATININE: 0.93 mg/dL (ref 0.50–1.10)
Chloride: 96 mEq/L (ref 96–112)
GFR calc Af Amer: 68 mL/min — ABNORMAL LOW (ref >90)
GFR, EST NON AFRICAN AMERICAN: 59 mL/min — AB (ref >90)
Glucose, Bld: 159 mg/dL — ABNORMAL HIGH (ref 70–99)
Potassium: 4.2 mEq/L (ref 3.7–5.3)
Sodium: 138 mEq/L (ref 137–147)

## 2013-12-04 LAB — I-STAT TROPONIN, ED: Troponin i, poc: 0 ng/mL (ref 0.00–0.08)

## 2013-12-04 MED ORDER — ONDANSETRON HCL 4 MG/2ML IJ SOLN
4.0000 mg | Freq: Once | INTRAMUSCULAR | Status: AC
  Administered 2013-12-04: 4 mg via INTRAVENOUS
  Filled 2013-12-04: qty 2

## 2013-12-04 MED ORDER — MORPHINE SULFATE 4 MG/ML IJ SOLN
4.0000 mg | Freq: Once | INTRAMUSCULAR | Status: AC
  Administered 2013-12-04: 4 mg via INTRAVENOUS
  Filled 2013-12-04: qty 1

## 2013-12-04 NOTE — ED Notes (Signed)
Patient transported to XR. 

## 2013-12-04 NOTE — ED Notes (Signed)
Updated family and patient on plan of care.  Labs will be redrawn at 0100.

## 2013-12-04 NOTE — ED Notes (Signed)
Patient complaining of pain and requests medication.  Informed MD.

## 2013-12-04 NOTE — ED Notes (Signed)
Called Dr. Alvino Chapel about chest pain management for patient's chest pain 7/10.  MD acknowledges, no new orders at this time.

## 2013-12-04 NOTE — ED Provider Notes (Signed)
CSN: [id number]     Arrival date & time 12/04/13  2047 History   First MD Initiated Contact with Patient 12/04/13 2120     Chief Complaint  Patient presents with  . Chest Pain     (Consider location/radiation/quality/duration/timing/severity/associated sxs/prior Treatment) Patient is a 73 y.o. female presenting with chest pain. The history is provided by the patient.  Chest Pain Pain location:  L chest Pain quality: sharp   Pain radiates to:  L arm Associated symptoms: no abdominal pain, no back pain, no headache, no nausea, no numbness, no shortness of breath, not vomiting and no weakness    patient presents with sharp left-sided chest pain. It comes and goes and lasts a second or 2. There is some radiation to left arm with the episodes. It began today. No nausea. No diaphoresis. No sweating. No fevers. No shortness of breath. She states she took some antacid and it did not help. She does have a history of GERD. She states she's had a stress test within the last few months by a cardiologist. No known coronary artery disease.  Past Medical History  Diagnosis Date  . Hypertension   . Diabetes mellitus without complication   . Hypercholesteremia   . Arthritis   . Anemia     takes iron daily   Past Surgical History  Procedure Laterality Date  . Tubal ligation    . Joint replacement Bilateral     '99  . Abdominal hysterectomy    . Colonoscopy with propofol N/A 07/03/2013    Procedure: COLONOSCOPY WITH PROPOFOL;  Surgeon: Garlan Fair, MD;  Location: [facility];  Service: Endoscopy;  Laterality: N/A;   History reviewed. No pertinent family history. History  Substance Use Topics  . Smoking status: Never Smoker   . Smokeless tobacco: Not on file  . Alcohol Use: No   OB History   Grav Para Term Preterm Abortions TAB SAB Ect Mult Living                 Review of Systems  Constitutional: Negative for activity change and appetite change.  Eyes: Negative for pain.   Respiratory: Negative for chest tightness and shortness of breath.   Cardiovascular: Positive for chest pain. Negative for leg swelling.  Gastrointestinal: Negative for nausea, vomiting, abdominal pain and diarrhea.  Genitourinary: Negative for flank pain.  Musculoskeletal: Negative for back pain and neck stiffness.  Skin: Negative for rash.  Neurological: Negative for weakness, numbness and headaches.  Psychiatric/Behavioral: Negative for behavioral problems.      Allergies  Review of patient's allergies indicates no known allergies.  Home Medications   Current Outpatient Rx  Name  Route  Sig  Dispense  Refill  . acetaminophen (TYLENOL) 500 MG tablet   Oral   Take 1,000 mg by mouth every 6 (six) hours as needed. For pain         . allopurinol (ZYLOPRIM) 100 MG tablet   Oral   Take 100 mg by mouth every morning.          Marland Kitchen amLODipine (NORVASC) 2.5 MG tablet   Oral   Take 2.5 mg by mouth at bedtime.          Marland Kitchen aspirin EC 81 MG tablet   Oral   Take 81 mg by mouth every evening.          . Calcium Citrate-Vitamin D (CALCIUM CITRATE + D PO)   Oral   Take 1 tablet by mouth daily.         Marland Kitchen  dexlansoprazole (DEXILANT) 60 MG capsule   Oral   Take 60 mg by mouth daily.         . ferrous sulfate 325 (65 FE) MG EC tablet   Oral   Take 325 mg by mouth 2 (two) times daily.         . hydrochlorothiazide (MICROZIDE) 12.5 MG capsule   Oral   Take 12.5 mg by mouth every evening.          . irbesartan-hydrochlorothiazide (AVALIDE) 150-12.5 MG per tablet   Oral   Take 1 tablet by mouth daily.         . Magnesium 250 MG TABS   Oral   Take 250 mg by mouth daily.         . metFORMIN (GLUCOPHAGE) 1000 MG tablet   Oral   Take 1,000 mg by mouth 2 (two) times daily with a meal.         . metoprolol (LOPRESSOR) 50 MG tablet   Oral   Take 50 mg by mouth 2 (two) times daily.         . Multiple Vitamins-Minerals (PRESERVISION AREDS 2 PO)   Oral   Take  1 capsule by mouth 2 (two) times daily.         Marland Kitchen OVER THE COUNTER MEDICATION   Topical   Apply 1 application topically 2 (two) times daily as needed (For pain.). Arthritis Cream         . sitaGLIPtin (JANUVIA) 100 MG tablet   Oral   Take 50 mg by mouth daily as needed (takes as needed in evenings).          . vitamin C (ASCORBIC ACID) 500 MG tablet   Oral   Take 500 mg by mouth daily.         . meclizine (ANTIVERT) 25 MG tablet   Oral   Take 1 tablet (25 mg total) by mouth 3 (three) times daily as needed for dizziness.   30 tablet   0    BP 126/45  Pulse 65  Temp(Src) 98.5 F (36.9 C) (Oral)  Resp 12  SpO2 98% Physical Exam  Nursing note and vitals reviewed. Constitutional: She is oriented to person, place, and time. She appears well-developed and well-nourished.  HENT:  Head: Normocephalic and atraumatic.  Neck: Normal range of motion. Neck supple.  Cardiovascular: Normal rate, regular rhythm and normal heart sounds.   No murmur heard. Pulmonary/Chest: Effort normal and breath sounds normal. No respiratory distress. She has no wheezes. She has no rales. She exhibits tenderness.  Mild tenderness to left anterior chest wall. No rash.  Abdominal: Soft. Bowel sounds are normal. She exhibits no distension. There is no tenderness. There is no rebound and no guarding.  Musculoskeletal: Normal range of motion. She exhibits no edema.  Neurological: She is alert and oriented to person, place, and time. No cranial nerve deficit.  Skin: Skin is warm and dry.  Psychiatric: She has a normal mood and affect. Her speech is normal.    ED Course  Procedures (including critical care time) Labs Review Labs Reviewed  CBC - Abnormal; Notable for the following:    Hemoglobin 10.1 (*)    HCT 31.0 (*)    MCV 75.8 (*)    MCH 24.7 (*)    All other components within normal limits  BASIC METABOLIC PANEL - Abnormal; Notable for the following:    Glucose, Bld 159 (*)    BUN 24 (*)  GFR calc non Af Amer 59 (*)    GFR calc Af Amer 68 (*)    All other components within normal limits  Randolm Idol, ED   Imaging Review Dg Chest 2 View  12/04/2013   CLINICAL DATA:  Chest pain.  EXAM: CHEST  2 VIEW  COMPARISON:  Single view of the chest 09/18/2013.  FINDINGS: Lung volumes are low but the lungs are clear. No pneumothorax or pleural effusion. Heart size normal. Thoracic spondylosis noted.  IMPRESSION: No acute disease.   Electronically Signed   By: Inge Rise M.D.   On: 12/04/2013 21:44     EKG Interpretation   Date/Time:  Tuesday December 04 2013 20:53:55 EDT Ventricular Rate:  79 PR Interval:  208 QRS Duration: 144 QT Interval:  418 QTC Calculation: 479 R Axis:   15 Text Interpretation:  Normal sinus rhythm Left bundle branch block  Abnormal ECG Confirmed by Alvino Chapel  MD, Ovid Curd [phone]) on 12/04/2013  9:33:56 PM      MDM   Final diagnoses:  Chest pain    Patient with chest pain. EKG reassuring. Enzymes negative. Low risk story for pain only lasts a second or 2. Not worse with exertion. We'll get 3 hour troponin and if negative patient will be discharged home to followup with her Dr.    Jasper Riling. Alvino Chapel, MD 12/04/13 [phone]

## 2013-12-04 NOTE — ED Notes (Signed)
Patient arrives from home with complaint of left sided chest pain. Described as being sharp in nature and intermittent. Has been taking dexilant per PCP and has run out. Endorses weakness and nausea, but no other anginal equivalents.

## 2013-12-05 LAB — I-STAT TROPONIN, ED: Troponin i, poc: 0 ng/mL (ref 0.00–0.08)

## 2013-12-05 MED ORDER — PANTOPRAZOLE SODIUM 40 MG PO TBEC
DELAYED_RELEASE_TABLET | ORAL | Status: AC
  Filled 2013-12-05: qty 1

## 2013-12-05 MED ORDER — GI COCKTAIL ~~LOC~~
ORAL | Status: AC
  Filled 2013-12-05: qty 30

## 2013-12-05 NOTE — ED Provider Notes (Signed)
Repeat troponin has come back normal. Patient actually states that she has continued to have pain and her pattern of pain seem suggestive for possible GERD. She's given GI cocktail with significant improvement. She apparently has been taking Dexilant for reflux symptoms but only on an as needed basis. I discussed with the patient and the need for daily treatment for periods of time to adequately control flareups of GERD. She is advised to take Prilosec OTC daily and to followup with her PCP.  Delora Fuel, MD [phone]

## 2014-07-15 ENCOUNTER — Other Ambulatory Visit: Payer: Self-pay

## 2014-07-15 ENCOUNTER — Other Ambulatory Visit (HOSPITAL_COMMUNITY): Payer: Self-pay | Admitting: Internal Medicine

## 2014-07-15 DIAGNOSIS — Z1231 Encounter for screening mammogram for malignant neoplasm of breast: Secondary | ICD-10-CM

## 2014-08-05 ENCOUNTER — Ambulatory Visit
Admission: RE | Admit: 2014-08-05 | Discharge: 2014-08-05 | Disposition: A | Discharge status: Home / Self Care | Payer: Medicare Other | Source: Ambulatory Visit

## 2014-08-05 DIAGNOSIS — Z1231 Encounter for screening mammogram for malignant neoplasm of breast: Secondary | ICD-10-CM

## 2014-08-25 ENCOUNTER — Other Ambulatory Visit: Payer: Self-pay

## 2014-08-25 ENCOUNTER — Observation Stay (HOSPITAL_COMMUNITY)
Admission: EM | Admit: 2014-08-25 | Discharge: 2014-08-27 | Disposition: A | Discharge status: Home / Self Care | Payer: Medicare Other | Attending: Cardiology | Admitting: Cardiology

## 2014-08-25 ENCOUNTER — Encounter (HOSPITAL_COMMUNITY): Payer: Self-pay | Admitting: Emergency Medicine

## 2014-08-25 DIAGNOSIS — I209 Angina pectoris, unspecified: Secondary | ICD-10-CM | POA: Diagnosis present

## 2014-08-25 DIAGNOSIS — R079 Chest pain, unspecified: Secondary | ICD-10-CM

## 2014-08-25 DIAGNOSIS — I129 Hypertensive chronic kidney disease with stage 1 through stage 4 chronic kidney disease, or unspecified chronic kidney disease: Secondary | ICD-10-CM | POA: Diagnosis not present

## 2014-08-25 DIAGNOSIS — D500 Iron deficiency anemia secondary to blood loss (chronic): Secondary | ICD-10-CM | POA: Insufficient documentation

## 2014-08-25 DIAGNOSIS — E780 Pure hypercholesterolemia: Secondary | ICD-10-CM | POA: Insufficient documentation

## 2014-08-25 DIAGNOSIS — E119 Type 2 diabetes mellitus without complications: Secondary | ICD-10-CM | POA: Diagnosis not present

## 2014-08-25 DIAGNOSIS — N182 Chronic kidney disease, stage 2 (mild): Secondary | ICD-10-CM | POA: Insufficient documentation

## 2014-08-25 DIAGNOSIS — I119 Hypertensive heart disease without heart failure: Secondary | ICD-10-CM | POA: Insufficient documentation

## 2014-08-25 DIAGNOSIS — E785 Hyperlipidemia, unspecified: Secondary | ICD-10-CM | POA: Insufficient documentation

## 2014-08-25 DIAGNOSIS — I447 Left bundle-branch block, unspecified: Secondary | ICD-10-CM | POA: Diagnosis not present

## 2014-08-25 DIAGNOSIS — R0789 Other chest pain: Secondary | ICD-10-CM | POA: Diagnosis not present

## 2014-08-25 NOTE — ED Notes (Signed)
Pt reports she is scheduled to have a heart catherization on Tuesday. Report she is a patient of Dr. Jenkins Rouge.

## 2014-08-25 NOTE — ED Provider Notes (Signed)
CSN: [id number]     Arrival date & time 08/25/14  2327 History  This chart was scribed for Quintella Reichert, MD by Marlowe Kays, ED Scribe. This patient was seen in room D36C/D36C and the patient's care was started at 11:56 PM.  Chief Complaint  Patient presents with  . Chest Pain   Patient is a 73 y.o. female presenting with chest pain. The history is provided by the patient. No language interpreter was used.  Chest Pain Associated symptoms: shortness of breath   Associated symptoms: no abdominal pain     HPI Comments:  TANEKIA RUGEL is a 73 y.o. female who presents to the Emergency Department complaining of nonradiating CP while watching television approximately five hours ago. She reports taking one Nitroglycerin tablet and another five minutes later as directed that eventually relieved the pain, but not immediately. Pt describes the pain as pressure and rates it as a 10/10 before the Nitro tablets. She endorses some intermittent SOB. Daughter reports the patient took an ASA 81 mg earlier this morning. Pt reports being scheduled for a heart catherization secondary in two days, with Dr. Jenkins Rouge, to frequent episodes of CP. No modifying factors are reported. Denies abdominal pain. Denies current CP or SOB.   Past Medical History  Diagnosis Date  . Hypertension   . Diabetes mellitus without complication   . Hypercholesteremia   . Arthritis   . Anemia     takes iron daily   Past Surgical History  Procedure Laterality Date  . Tubal ligation    . Joint replacement Bilateral     '99  . Abdominal hysterectomy    . Colonoscopy with propofol N/A 07/03/2013    Procedure: COLONOSCOPY WITH PROPOFOL;  Surgeon: Garlan Fair, MD;  Location: [facility];  Service: Endoscopy;  Laterality: N/A;   No family history on file. History  Substance Use Topics  . Smoking status: Never Smoker   . Smokeless tobacco: Not on file  . Alcohol Use: No   OB History    No data available     Review of  Systems  Respiratory: Positive for shortness of breath.   Cardiovascular: Positive for chest pain.  Gastrointestinal: Negative for abdominal pain.   Allergies  Meclizine  Home Medications   Prior to Admission medications   Medication Sig Start Date End Date Taking? Authorizing Provider  acetaminophen (TYLENOL) 500 MG tablet Take 1,000 mg by mouth every 6 (six) hours as needed. For pain   Yes Historical Provider, MD  allopurinol (ZYLOPRIM) 100 MG tablet Take 100 mg by mouth every morning.    Yes Historical Provider, MD  amLODipine (NORVASC) 2.5 MG tablet Take 2.5 mg by mouth at bedtime.    Yes Historical Provider, MD  aspirin EC 81 MG tablet Take 81 mg by mouth every evening.    Yes Historical Provider, MD  Calcium Citrate-Vitamin D (CALCIUM CITRATE + D PO) Take 1 tablet by mouth daily.   Yes Historical Provider, MD  ferrous sulfate 325 (65 FE) MG EC tablet Take 325 mg by mouth 2 (two) times daily.   Yes Historical Provider, MD  hydrochlorothiazide (MICROZIDE) 12.5 MG capsule Take 12.5 mg by mouth every evening.    Yes Historical Provider, MD  irbesartan-hydrochlorothiazide (AVALIDE) 150-12.5 MG per tablet Take 1 tablet by mouth daily.   Yes Historical Provider, MD  Magnesium 250 MG TABS Take 250 mg by mouth daily.   Yes Historical Provider, MD  metFORMIN (GLUCOPHAGE) 1000 MG tablet Take 1,000 mg  by mouth 2 (two) times daily with a meal.   Yes Historical Provider, MD  metoprolol (LOPRESSOR) 50 MG tablet Take 50 mg by mouth 2 (two) times daily.   Yes Historical Provider, MD  Multiple Vitamins-Minerals (PRESERVISION AREDS 2 PO) Take 1 capsule by mouth 2 (two) times daily.   Yes Historical Provider, MD  nitroGLYCERIN (NITROSTAT) 0.4 MG SL tablet Place 0.4 mg under the tongue every 5 (five) minutes as needed for chest pain.   Yes Historical Provider, MD  OVER THE COUNTER MEDICATION Apply 1 application topically 2 (two) times daily as needed (For pain.). Arthritis Cream   Yes Historical Provider,  MD  sitaGLIPtin (JANUVIA) 100 MG tablet Take 50 mg by mouth daily as needed (takes as needed in evenings).    Yes Historical Provider, MD  vitamin C (ASCORBIC ACID) 500 MG tablet Take 500 mg by mouth daily.   Yes Historical Provider, MD  meclizine (ANTIVERT) 25 MG tablet Take 1 tablet (25 mg total) by mouth 3 (three) times daily as needed for dizziness. Patient not taking: Reported on 08/16/2014 09/18/13   Fredia Sorrow, MD   Triage Vitals: BP 159/62 mmHg  Pulse 74  Temp(Src) 98.2 F (36.8 C) (Oral)  Resp 17  SpO2 98% Physical Exam  Constitutional: She is oriented to person, place, and time. She appears well-developed and well-nourished.  HENT:  Head: Normocephalic and atraumatic.  Cardiovascular: Normal rate and regular rhythm.   No murmur heard. Pulmonary/Chest: Effort normal and breath sounds normal. No respiratory distress.  Abdominal: Soft. There is no tenderness. There is no rebound and no guarding.  Musculoskeletal: She exhibits edema. She exhibits no tenderness.  2+ nonpitting edema in bilateral lower extremities.  Neurological: She is alert and oriented to person, place, and time.  Skin: Skin is warm and dry.  Psychiatric: She has a normal mood and affect. Her behavior is normal.  Nursing note and vitals reviewed.   ED Course  Procedures (including critical care time) DIAGNOSTIC STUDIES: Oxygen Saturation is 98% on RA, normal by my interpretation.   COORDINATION OF CARE: 12:03 AM- Will order lab work. Pt verbalizes understanding and agrees to plan.  Medications  nitroGLYCERIN (NITROSTAT) SL tablet 0.4 mg (0.4 mg Sublingual Given 08/26/14 0055)  aspirin chewable tablet 324 mg (243 mg Oral Given 08/26/14 0037)    Labs Review Labs Reviewed  CBC - Abnormal; Notable for the following:    Hemoglobin 10.1 (*)    HCT 32.5 (*)    MCV 75.6 (*)    MCH 23.5 (*)    All other components within normal limits  BASIC METABOLIC PANEL - Abnormal; Notable for the following:     Sodium 134 (*)    Glucose, Bld 202 (*)    GFR calc non Af Amer 51 (*)    GFR calc Af Amer 59 (*)    All other components within normal limits  TROPONIN I  TROPONIN I  TROPONIN I  I-STAT TROPOININ, ED    Imaging Review Dg Chest 2 View  08/26/2014   CLINICAL DATA:  Left-sided chest pain beginning tonight. History of hypertension and diabetes.  EXAM: CHEST  2 VIEW  COMPARISON:  12/04/2013  FINDINGS: The heart size and mediastinal contours are within normal limits. Both lungs are clear. The visualized skeletal structures are unremarkable.  IMPRESSION: No active cardiopulmonary disease.   Electronically Signed   By: Rolm Baptise M.D.   On: 08/26/2014 00:20     EKG Interpretation None  Unable to upload EKG in MUSE.  EKG with sinus rhythm, rate of 70. LBBB present.   MDM   Final diagnoses:  Chest pain, unspecified chest pain type    Patient with history of hypertension and diabetes here for evaluation of chest pain. EKG with left bundle branch block currently unable to pull up priors for comparison. Patient's pain does resolve with nitroglycerin. Troponin is negative 1. Discussed with Dr. Cecille Amsterdam patient's presentation and history, plan to admit to the hospital and he will evaluate in the morning. Clinical picture is not consistent with pneumonia or PE.  I personally performed the services described in this documentation, which was scribed in my presence. The recorded information has been reviewed and is accurate.    Quintella Reichert, MD 08/26/14 [phone]

## 2014-08-25 NOTE — ED Notes (Signed)
Per Patient: Reports sudden onset CP starting at 27. Pt reports CP is in the middle of her chest, does not radiate. Pt did have intermittent SOB, endorses some nausea now. Reports taking 2 NTG PTA, reports pain decreased but she still feels pressure in her chest. Currently Ax4, NAD.

## 2014-08-26 ENCOUNTER — Other Ambulatory Visit: Payer: Self-pay

## 2014-08-26 ENCOUNTER — Emergency Department (HOSPITAL_COMMUNITY): Payer: Medicare Other

## 2014-08-26 ENCOUNTER — Encounter (HOSPITAL_COMMUNITY): Payer: Self-pay | Admitting: General Practice

## 2014-08-26 DIAGNOSIS — I209 Angina pectoris, unspecified: Secondary | ICD-10-CM | POA: Diagnosis present

## 2014-08-26 LAB — BASIC METABOLIC PANEL
Anion gap: 9 (ref 5–15)
BUN: 21 mg/dL (ref 6–23)
CHLORIDE: 97 meq/L (ref 96–112)
CO2: 28 mmol/L (ref 19–32)
Calcium: 10.1 mg/dL (ref 8.4–10.5)
Creatinine, Ser: 1.05 mg/dL (ref 0.50–1.10)
GFR calc non Af Amer: 51 mL/min — ABNORMAL LOW (ref >90)
GFR, EST AFRICAN AMERICAN: 59 mL/min — AB (ref >90)
Glucose, Bld: 202 mg/dL — ABNORMAL HIGH (ref 70–99)
POTASSIUM: 4.2 mmol/L (ref 3.5–5.1)
Sodium: 134 mmol/L — ABNORMAL LOW (ref 135–145)

## 2014-08-26 LAB — CBC
HCT: 32.4 % — ABNORMAL LOW (ref 36.0–46.0)
HEMATOCRIT: 32.5 % — AB (ref 36.0–46.0)
Hemoglobin: 10.1 g/dL — ABNORMAL LOW (ref 12.0–15.0)
Hemoglobin: 10.2 g/dL — ABNORMAL LOW (ref 12.0–15.0)
MCH: 23.5 pg — ABNORMAL LOW (ref 26.0–34.0)
MCH: 24.4 pg — AB (ref 26.0–34.0)
MCHC: 31.1 g/dL (ref 30.0–36.0)
MCHC: 31.5 g/dL (ref 30.0–36.0)
MCV: 75.6 fL — ABNORMAL LOW (ref 78.0–100.0)
MCV: 77.5 fL — ABNORMAL LOW (ref 78.0–100.0)
Platelets: 248 10*3/uL (ref 150–400)
Platelets: 304 10*3/uL (ref 150–400)
RBC: 4.18 MIL/uL (ref 3.87–5.11)
RBC: 4.3 MIL/uL (ref 3.87–5.11)
RDW: 14.7 % (ref 11.5–15.5)
RDW: 14.8 % (ref 11.5–15.5)
WBC: 5.7 10*3/uL (ref 4.0–10.5)
WBC: 6.2 10*3/uL (ref 4.0–10.5)

## 2014-08-26 LAB — TROPONIN I: Troponin I: <0.03 ng/mL (ref <0.031)

## 2014-08-26 LAB — GLUCOSE, CAPILLARY
GLUCOSE-CAPILLARY: 112 mg/dL — AB (ref 70–99)
Glucose-Capillary: 206 mg/dL — ABNORMAL HIGH (ref 70–99)
Glucose-Capillary: 96 mg/dL (ref 70–99)

## 2014-08-26 LAB — CREATININE, SERUM
Creatinine, Ser: 0.89 mg/dL (ref 0.50–1.10)
GFR calc Af Amer: 72 mL/min — ABNORMAL LOW (ref >90)
GFR, EST NON AFRICAN AMERICAN: 62 mL/min — AB (ref >90)

## 2014-08-26 LAB — I-STAT TROPONIN, ED: Troponin i, poc: 0 ng/mL (ref 0.00–0.08)

## 2014-08-26 LAB — CBG MONITORING, ED: Glucose-Capillary: 123 mg/dL — ABNORMAL HIGH (ref 70–99)

## 2014-08-26 MED ORDER — NITROGLYCERIN 0.4 MG SL SUBL
0.4000 mg | SUBLINGUAL_TABLET | SUBLINGUAL | Status: DC | PRN
  Administered 2014-08-26 (×2): 0.4 mg via SUBLINGUAL
  Filled 2014-08-26: qty 1

## 2014-08-26 MED ORDER — SODIUM CHLORIDE 0.9 % IV SOLN
INTRAVENOUS | Status: DC
  Administered 2014-08-26: 1000 mL via INTRAVENOUS

## 2014-08-26 MED ORDER — SODIUM CHLORIDE 0.9 % IV SOLN
INTRAVENOUS | Status: DC
  Administered 2014-08-27: 04:00:00 via INTRAVENOUS

## 2014-08-26 MED ORDER — ACETAMINOPHEN 325 MG PO TABS
650.0000 mg | ORAL_TABLET | ORAL | Status: DC | PRN
  Administered 2014-08-26: 650 mg via ORAL
  Filled 2014-08-26: qty 2

## 2014-08-26 MED ORDER — SODIUM CHLORIDE 0.9 % IV SOLN: 250.0000 mL | INTRAVENOUS | Status: DC | PRN

## 2014-08-26 MED ORDER — HYDROCHLOROTHIAZIDE 12.5 MG PO CAPS
12.5000 mg | ORAL_CAPSULE | Freq: Every day | ORAL | Status: DC
  Administered 2014-08-26 – 2014-08-27 (×2): 12.5 mg via ORAL
  Filled 2014-08-26 (×2): qty 1

## 2014-08-26 MED ORDER — METOPROLOL TARTRATE 50 MG PO TABS
50.0000 mg | ORAL_TABLET | Freq: Two times a day (BID) | ORAL | Status: DC
  Administered 2014-08-26 – 2014-08-27 (×3): 50 mg via ORAL
  Filled 2014-08-26: qty 2
  Filled 2014-08-26 (×2): qty 1

## 2014-08-26 MED ORDER — INSULIN ASPART 100 UNIT/ML ~~LOC~~ SOLN
0.0000 [IU] | Freq: Three times a day (TID) | SUBCUTANEOUS | Status: DC
  Administered 2014-08-26: 5 [IU] via SUBCUTANEOUS

## 2014-08-26 MED ORDER — AMLODIPINE BESYLATE 2.5 MG PO TABS
2.5000 mg | ORAL_TABLET | Freq: Every day | ORAL | Status: DC
  Administered 2014-08-26: 2.5 mg via ORAL
  Filled 2014-08-26: qty 1

## 2014-08-26 MED ORDER — SODIUM CHLORIDE 0.9 % IJ SOLN
3.0000 mL | Freq: Two times a day (BID) | INTRAMUSCULAR | Status: DC
  Filled 2014-08-26: qty 3

## 2014-08-26 MED ORDER — ENOXAPARIN SODIUM 40 MG/0.4ML ~~LOC~~ SOLN
40.0000 mg | SUBCUTANEOUS | Status: DC
  Administered 2014-08-26: 40 mg via SUBCUTANEOUS
  Filled 2014-08-26 (×2): qty 0.4

## 2014-08-26 MED ORDER — IRBESARTAN 150 MG PO TABS
150.0000 mg | ORAL_TABLET | Freq: Every day | ORAL | Status: DC
  Administered 2014-08-26 – 2014-08-27 (×2): 150 mg via ORAL
  Filled 2014-08-26 (×2): qty 1

## 2014-08-26 MED ORDER — ASPIRIN 300 MG RE SUPP: 300.0000 mg | RECTAL | Status: AC

## 2014-08-26 MED ORDER — ONDANSETRON HCL 4 MG/2ML IJ SOLN: 4.0000 mg | Freq: Four times a day (QID) | INTRAMUSCULAR | Status: DC | PRN

## 2014-08-26 MED ORDER — ASPIRIN 81 MG PO CHEW
324.0000 mg | CHEWABLE_TABLET | ORAL | Status: AC
  Administered 2014-08-26: 324 mg via ORAL
  Filled 2014-08-26: qty 4

## 2014-08-26 MED ORDER — ALLOPURINOL 100 MG PO TABS
100.0000 mg | ORAL_TABLET | Freq: Every morning | ORAL | Status: DC
  Administered 2014-08-26 – 2014-08-27 (×2): 100 mg via ORAL
  Filled 2014-08-26 (×2): qty 1

## 2014-08-26 MED ORDER — IRBESARTAN-HYDROCHLOROTHIAZIDE 150-12.5 MG PO TABS: 1.0000 | ORAL_TABLET | Freq: Every day | ORAL | Status: DC

## 2014-08-26 MED ORDER — SODIUM CHLORIDE 0.9 % IJ SOLN: 3.0000 mL | INTRAMUSCULAR | Status: DC | PRN

## 2014-08-26 MED ORDER — ASPIRIN 81 MG PO CHEW
324.0000 mg | CHEWABLE_TABLET | Freq: Once | ORAL | Status: AC
  Administered 2014-08-26: 243 mg via ORAL
  Filled 2014-08-26: qty 4

## 2014-08-26 NOTE — ED Notes (Signed)
Patient eating breakfast. °

## 2014-08-26 NOTE — ED Notes (Signed)
Diet called and informed that pt moved to [place] [facility].

## 2014-08-26 NOTE — ED Notes (Signed)
Food tray ordered, heart Healthy,to Room 36D.

## 2014-08-26 NOTE — H&P (Signed)
Krista Clarke is an 73 y.o. female.   Chief Complaint: Chest pain  HPI: Krista Clarke is a 73 year old African-American female with a history of hyperlipidemia, HTN, and DM II with CKD stage II. I had seen her in the office last week when she reported intermittent chest pain over the past year but worse over the past several weeks. She states she developed similar pain last night.  She decribes the pain as a burning or pressure on the left side of her chest.  Denies associated SOB. She took SL Ntg which temporarily relieved her pain. She is already scheduled for cardiac catheterization tomorrow morning but her daughter encouraged her to come to ED last night due to worsening symptoms.  I'd seen her recently in the office, she has previously had a low risk stress test, but due to recurrence of chest pain I felt that repeating a stress test that was done a year ago in a patient with multiple cardiac vascular risk factors was unlikely to give much higher yield, hence I'll set her up for a ration coronary angiography.   She recently had labwork with her PCP although we do not have these records and she is unsure of how well controlled her lipids and diabetes are. She denies PND, orthopnea, syncope, or symptoms suggestive of claudication or TIA. She reports mild LE swelling which is chronic. She is markedly sedentary due to chronic back pain and never walks more than a few feet at a time, so is unable to tell if she has any exertional dyspnea or chest pain.   Past Medical History  Diagnosis Date  . Hypertension   . Diabetes mellitus without complication   . Hypercholesteremia   . Arthritis   . Anemia     takes iron daily    Past Surgical History  Procedure Laterality Date  . Tubal ligation    . Joint replacement Bilateral     '99  . Abdominal hysterectomy    . Colonoscopy with propofol N/A 07/03/2013    Procedure: COLONOSCOPY WITH PROPOFOL;  Surgeon: Garlan Fair, MD;  Location: [facility];   Service: Endoscopy;  Laterality: N/A;    No family history on file. Social History:  reports that she has never smoked. She does not have any smokeless tobacco history on file. She reports that she does not drink alcohol or use illicit drugs.  Allergies:  Allergies  Allergen Reactions  . Meclizine Nausea And Vomiting    Made her feel more dizzy     (Not in a hospital admission)    Review of Systems - History obtained from the patient General ROS: negative for - chills, fatigue, fever or night sweats Respiratory ROS: positive for - SOB with exertion, decreased exercise tolerance negative for - cough, hemoptysis, orthopnea or wheezing Cardiovascular ROS: positive for - chest pain and edema negative for - orthopnea, palpitations or paroxysmal nocturnal dyspnea Musculoskeletal ROS: positive for - chronic back pain  Blood pressure 138/47, pulse 72, temperature 98 F (36.7 C), temperature source Oral, resp. rate 11, SpO2 98 %. General appearance: alert, cooperative, appears stated age and no distress Eyes: negative Neck: no adenopathy, no carotid bruit, no JVD, supple, symmetrical, trachea midline and thyroid not enlarged, symmetric, no tenderness/mass/nodules Neck: JVP - normal, carotids 2+= without bruits Resp: clear to auscultation bilaterally Chest wall: no tenderness Cardio: regular rate and rhythm, S1: normal and S2: physiologically split GI: soft, non-tender; bowel sounds normal; no masses,  no organomegaly Extremities:  edema 1+ bilaterally Pulses: 2+ and symmetric Skin: Skin color, texture, turgor normal. No rashes or lesions Neurologic: Grossly normal  Results for orders placed or performed during the hospital encounter of 08/25/14 (from the past 48 hour(s))  CBC     Status: Abnormal   Collection Time: 08/25/14 11:54 PM  Result Value Ref Range   WBC 6.2 4.0 - 10.5 K/uL   RBC 4.30 3.87 - 5.11 MIL/uL   Hemoglobin 10.1 (L) 12.0 - 15.0 g/dL   HCT 32.5 (L) 36.0 - 46.0 %    MCV 75.6 (L) 78.0 - 100.0 fL   MCH 23.5 (L) 26.0 - 34.0 pg   MCHC 31.1 30.0 - 36.0 g/dL   RDW 14.7 11.5 - 15.5 %   Platelets 304 150 - 400 K/uL  Basic metabolic panel     Status: Abnormal   Collection Time: 08/25/14 11:54 PM  Result Value Ref Range   Sodium 134 (L) 135 - 145 mmol/L    Comment: Please note change in reference range.   Potassium 4.2 3.5 - 5.1 mmol/L    Comment: Please note change in reference range.   Chloride 97 96 - 112 mEq/L   CO2 28 19 - 32 mmol/L   Glucose, Bld 202 (H) 70 - 99 mg/dL   BUN 21 6 - 23 mg/dL   Creatinine, Ser 1.05 0.50 - 1.10 mg/dL   Calcium 10.1 8.4 - 10.5 mg/dL   GFR calc non Af Amer 51 (L) >90 mL/min   GFR calc Af Amer 59 (L) >90 mL/min    Comment: (NOTE) The eGFR has been calculated using the CKD EPI equation. This calculation has not been validated in all clinical situations. eGFR's persistently <90 mL/min signify possible Chronic Kidney Disease.    Anion gap 9 5 - 15  Troponin I (q 6hr x 3)     Status: None   Collection Time: 08/25/14 11:55 PM  Result Value Ref Range   Troponin I <0.03 <0.031 ng/mL    Comment:        NO INDICATION OF MYOCARDIAL INJURY. Please note change in reference range.   I-stat troponin, ED (not at [facility])     Status: None   Collection Time: 08/26/14 12:01 AM  Result Value Ref Range   Troponin i, poc 0.00 0.00 - 0.08 ng/mL   Comment 3            Comment: Due to the release kinetics of cTnI, a negative result within the first hours of the onset of symptoms does not rule out myocardial infarction with certainty. If myocardial infarction is still suspected, repeat the test at appropriate intervals.   Troponin I (q 6hr x 3)     Status: None   Collection Time: 08/26/14  8:10 AM  Result Value Ref Range   Troponin I <0.03 <0.031 ng/mL    Comment:        NO INDICATION OF MYOCARDIAL INJURY. Please note change in reference range.   CBG monitoring, ED     Status: Abnormal   Collection Time: 08/26/14  8:31 AM   Result Value Ref Range   Glucose-Capillary 123 (H) 70 - 99 mg/dL   Dg Chest 2 View  08/26/2014   CLINICAL DATA:  Left-sided chest pain beginning tonight. History of hypertension and diabetes.  EXAM: CHEST  2 VIEW  COMPARISON:  12/04/2013  FINDINGS: The heart size and mediastinal contours are within normal limits. Both lungs are clear. The visualized skeletal structures are unremarkable.  IMPRESSION: No active cardiopulmonary disease.   Electronically Signed   By: Rolm Baptise M.D.   On: 08/26/2014 00:20    Labs:   Lab Results  Component Value Date   WBC 6.2 08/25/2014   HGB 10.1* 08/25/2014   HCT 32.5* 08/25/2014   MCV 75.6* 08/25/2014   PLT 304 08/25/2014    Recent Labs Lab 08/25/14 2354  NA 134*  K 4.2  CL 97  CO2 28  BUN 21  CREATININE 1.05  CALCIUM 10.1  GLUCOSE 202*   Lab Results  Component Value Date   TROPONINI <0.03 08/26/2014    Lipid Panel  No results found for: CHOL, TRIG, HDL, CHOLHDL, VLDL, LDLCALC  EKG: sinus rhythm, LBBB, unchanged from previous tracings.  Out patient Nuclear stress test. 07/16/2013  1. Resting EKG NSR, LBBB. Stress EKG was non diagnostic for ischemia. No ADDITIONAL ST-T changes of ischemia noted with pharmacologic stress testing. Stress symptoms included lightheadedness, headache and chest pressure. Stress terminated due to completion of protocol. 2. The perfusion study demonstrated a very prominent breast attenuation artifact in the inferior, inferoseptal region, large size. Left ventricular ejection fraction was estimated to be 78%. This appearance is low risk study.    Assessment/Plan 1. Atypical chest pain, probably Angina Pectoris: previously scheduled for [facility] cath tomorrow morning.  Will proceed with this as scheduled. Lab schedule is poor due to post holiday issues. Patient is diabetic and do not want to keep npo. 2. HTN: controlled, continue home medications 3. DM: Hold metformin due to planned catheterization. ACHS sliding  scale insulin ordered.  Krista Bo, NP-C 08/26/2014, 10:11 AM [facility]. [place]  I have personally reviewed the patient's record and performed physical exam and agree with the assessment and plan of Ms. Neldon Labella, NP-C  Pager: [phone]: [phone] If no answer: Cell:  [phone]

## 2014-08-26 NOTE — ED Notes (Signed)
Dr. Ralene Bathe notified of chest pain and new EKG completed,

## 2014-08-27 ENCOUNTER — Encounter (HOSPITAL_COMMUNITY)
Admission: EM | Disposition: A | Discharge status: Home / Self Care | Payer: Self-pay | Source: Home / Self Care | Attending: Emergency Medicine

## 2014-08-27 ENCOUNTER — Encounter (HOSPITAL_COMMUNITY): Payer: Self-pay | Admitting: Cardiology

## 2014-08-27 ENCOUNTER — Ambulatory Visit (HOSPITAL_COMMUNITY): Admission: RE | Admit: 2014-08-27 | Payer: Medicare Other | Source: Ambulatory Visit | Admitting: Cardiology

## 2014-08-27 DIAGNOSIS — R0789 Other chest pain: Secondary | ICD-10-CM | POA: Diagnosis not present

## 2014-08-27 DIAGNOSIS — I447 Left bundle-branch block, unspecified: Secondary | ICD-10-CM | POA: Diagnosis not present

## 2014-08-27 DIAGNOSIS — E785 Hyperlipidemia, unspecified: Secondary | ICD-10-CM | POA: Diagnosis not present

## 2014-08-27 DIAGNOSIS — E119 Type 2 diabetes mellitus without complications: Secondary | ICD-10-CM | POA: Diagnosis not present

## 2014-08-27 HISTORY — PX: LEFT HEART CATHETERIZATION WITH CORONARY ANGIOGRAM: SHX5451

## 2014-08-27 LAB — BASIC METABOLIC PANEL
ANION GAP: 13 (ref 5–15)
BUN: 20 mg/dL (ref 6–23)
CHLORIDE: 98 meq/L (ref 96–112)
CO2: 24 mmol/L (ref 19–32)
Calcium: 9.5 mg/dL (ref 8.4–10.5)
Creatinine, Ser: 0.9 mg/dL (ref 0.50–1.10)
GFR calc Af Amer: 71 mL/min — ABNORMAL LOW (ref >90)
GFR, EST NON AFRICAN AMERICAN: 61 mL/min — AB (ref >90)
Glucose, Bld: 130 mg/dL — ABNORMAL HIGH (ref 70–99)
POTASSIUM: 3.6 mmol/L (ref 3.5–5.1)
SODIUM: 135 mmol/L (ref 135–145)

## 2014-08-27 LAB — CBC
HCT: 31.3 % — ABNORMAL LOW (ref 36.0–46.0)
HEMOGLOBIN: 9.8 g/dL — AB (ref 12.0–15.0)
MCH: 23.7 pg — ABNORMAL LOW (ref 26.0–34.0)
MCHC: 31.3 g/dL (ref 30.0–36.0)
MCV: 75.8 fL — ABNORMAL LOW (ref 78.0–100.0)
PLATELETS: 253 10*3/uL (ref 150–400)
RBC: 4.13 MIL/uL (ref 3.87–5.11)
RDW: 14.7 % (ref 11.5–15.5)
WBC: 5.5 10*3/uL (ref 4.0–10.5)

## 2014-08-27 LAB — GLUCOSE, CAPILLARY
GLUCOSE-CAPILLARY: 129 mg/dL — AB (ref 70–99)
Glucose-Capillary: 149 mg/dL — ABNORMAL HIGH (ref 70–99)

## 2014-08-27 LAB — LIPID PANEL
CHOL/HDL RATIO: 3.4 ratio
Cholesterol: 175 mg/dL (ref 0–200)
HDL: 52 mg/dL (ref >39)
LDL CALC: 94 mg/dL (ref 0–99)
TRIGLYCERIDES: 146 mg/dL (ref <150)
VLDL: 29 mg/dL (ref 0–40)

## 2014-08-27 LAB — HEMOGLOBIN: Hemoglobin: 9.8 g/dL — ABNORMAL LOW (ref 12.0–15.0)

## 2014-08-27 LAB — PROTIME-INR
INR: 1.07 (ref 0.00–1.49)
Prothrombin Time: 14 seconds (ref 11.6–15.2)

## 2014-08-27 SURGERY — LEFT HEART CATHETERIZATION WITH CORONARY ANGIOGRAM
Anesthesia: LOCAL

## 2014-08-27 MED ORDER — SODIUM CHLORIDE 0.9 % IJ SOLN
3.0000 mL | Freq: Two times a day (BID) | INTRAMUSCULAR | Status: DC
  Administered 2014-08-27: 3 mL via INTRAVENOUS

## 2014-08-27 MED ORDER — ASPIRIN 81 MG PO CHEW
81.0000 mg | CHEWABLE_TABLET | ORAL | Status: AC
  Administered 2014-08-27: 81 mg via ORAL
  Filled 2014-08-27: qty 1

## 2014-08-27 MED ORDER — VERAPAMIL HCL 2.5 MG/ML IV SOLN
INTRAVENOUS | Status: AC
  Filled 2014-08-27: qty 2

## 2014-08-27 MED ORDER — NITROGLYCERIN 1 MG/10 ML FOR IR/CATH LAB
INTRA_ARTERIAL | Status: AC
  Filled 2014-08-27: qty 10

## 2014-08-27 MED ORDER — SODIUM CHLORIDE 0.9 % IV SOLN: INTRAVENOUS | Status: DC

## 2014-08-27 MED ORDER — HEPARIN SODIUM (PORCINE) 1000 UNIT/ML IJ SOLN
INTRAMUSCULAR | Status: AC
  Filled 2014-08-27: qty 1

## 2014-08-27 MED ORDER — HEPARIN (PORCINE) IN NACL 2-0.9 UNIT/ML-% IJ SOLN
INTRAMUSCULAR | Status: AC
  Filled 2014-08-27: qty 1000

## 2014-08-27 MED ORDER — LIDOCAINE HCL (PF) 1 % IJ SOLN
INTRAMUSCULAR | Status: AC
Start: 2014-08-27 — End: 2014-08-27
  Filled 2014-08-27: qty 30

## 2014-08-27 MED ORDER — SODIUM CHLORIDE 0.9 % IV SOLN
1.0000 mL/kg/h | INTRAVENOUS | Status: DC
  Administered 2014-08-27: 1 mL/kg/h via INTRAVENOUS

## 2014-08-27 MED ORDER — MIDAZOLAM HCL 2 MG/2ML IJ SOLN
INTRAMUSCULAR | Status: AC
  Filled 2014-08-27: qty 2

## 2014-08-27 MED ORDER — SODIUM CHLORIDE 0.9 % IJ SOLN: 3.0000 mL | INTRAMUSCULAR | Status: DC | PRN

## 2014-08-27 MED ORDER — HYDROMORPHONE HCL 1 MG/ML IJ SOLN
INTRAMUSCULAR | Status: AC
  Filled 2014-08-27: qty 1

## 2014-08-27 MED ORDER — SODIUM CHLORIDE 0.9 % IV SOLN: 250.0000 mL | INTRAVENOUS | Status: DC | PRN

## 2014-08-27 NOTE — Interval H&P Note (Signed)
History and Physical Interval Note:  08/27/2014 10:23 AM  Krista Clarke  has presented today for surgery, with the diagnosis of cp   The various methods of treatment have been discussed with the patient and family. After consideration of risks, benefits and other options for treatment, the patient has consented to  Procedure(s): LEFT HEART CATHETERIZATION WITH CORONARY ANGIOGRAM (N/A) and possible PCI  as a surgical intervention .  The patient's history has been reviewed, patient examined, no change in status, stable for surgery.  I have reviewed the patient's chart and labs.  Questions were answered to the patient's satisfaction.   Cath Lab Visit (complete for each Cath Lab visit)  Clinical Evaluation Leading to the Procedure:   ACS: Yes.    Non-ACS:    Anginal Classification: CCS IV. Intermediate coronary syndrome  Anti-ischemic medical therapy: Maximal Therapy (2 or more classes of medications)  Non-Invasive Test Results: No non-invasive testing performed  Prior CABG: No previous CABG         [facility] R

## 2014-08-27 NOTE — Discharge Instructions (Addendum)
Radial Site Care °Refer to this sheet in the next few weeks. These instructions provide you with information on caring for yourself after your procedure. Your caregiver may also give you more specific instructions. Your treatment has been planned according to current medical practices, but problems sometimes occur. Call your caregiver if you have any problems or questions after your procedure. °HOME CARE INSTRUCTIONS °· You may shower the day after the procedure. Remove the bandage (dressing) and gently wash the site with plain soap and water. Gently pat the site dry. °· Do not apply powder or lotion to the site. °· Do not submerge the affected site in water for 3 to 5 days. °· Inspect the site at least twice daily. °· Do not flex or bend the affected arm for 24 hours. °· No lifting over 5 pounds (2.3 kg) for 5 days after your procedure. °· Do not drive home if you are discharged the same day of the procedure. Have someone else drive you. °· You may drive 24 hours after the procedure unless otherwise instructed by your caregiver. °· Do not operate machinery or power tools for 24 hours. °· A responsible adult should be with you for the first 24 hours after you arrive home. °What to expect: °· Any bruising will usually fade within 1 to 2 weeks. °· Blood that collects in the tissue (hematoma) may be painful to the touch. It should usually decrease in size and tenderness within 1 to 2 weeks. °SEEK IMMEDIATE MEDICAL CARE IF: °· You have unusual pain at the radial site. °· You have redness, warmth, swelling, or pain at the radial site. °· You have drainage (other than a small amount of blood on the dressing). °· You have chills. °· You have a fever or persistent symptoms for more than 72 hours. °· You have a fever and your symptoms suddenly get worse. °· Your arm becomes pale, cool, tingly, or numb. °· You have heavy bleeding from the site. Hold pressure on the site. °Document Released: 09/18/2010 Document Revised:  11/08/2011 Document Reviewed: 09/18/2010 °ExitCare® Patient Information ©2015 [employer]. This information is not intended to replace advice given to you by your health care provider. Make sure you discuss any questions you have with your health care provider. ° °

## 2014-08-27 NOTE — CV Procedure (Signed)
Procedure performed:  Left heart catheterization including hemodynamic monitoring of the left ventricle, LV gram, selective right and left coronary arteriography.  Indication patient is a 73 year-old African-American female with history of hypertension,  hyperlipidemia, left bundle branch block, chest pain syndrome, Diabetes Mellitus   who presents with chest pain to the emergency room, treated with sublingual nitroglycerin without any recurrence. She was admitted to the hospitalist intermediate coronary syndrome. About a year ago she has had a normal nuclear stress test but was scheduled for outpatient coronary angiography due to recurrent chest discomfort in multiple car vascular risk factors. Hemodynamic data:  Left ventricular pressure was 109/6 with LVEDP of 10 mm mercury. Aortic pressure was 96/51 with a mean of 67 mm mercury. There was no pressure gradient across the aortic valve  Left ventricle: Performed in the RAO projection revealed LVEF of 60 %. There was no obvious significant MR. no wall motion abnormality.  Right coronary artery: The vessel is smooth, normal,  Dominant. It is tortuous and has an anterior origin.  Left main coronary artery is large and normal.  Circumflex coronary artery: A large vessel giving origin to a large obtuse marginal 1. It is smooth and normal. It is severely tortuous.  LAD:  LAD gives origin to a large diagonal-1.  LAD is severely tortuous.  Ramus intermediate: Smooth and normal, again tortuosity is evident.  Impression: Suspect chest pain due to hypertensive heart disease. Severe tortuosity of the coronary vessels suggest hypertensive heart disease. Her chest pain could also be secondary to GI etiology. I would evaluate for noncardiac causes of chest pain first. Patient has chronic anemia, may need further GI evaluation.  Technique: Under sterile precautions using a 6 French right radial  arterial access, a 6 French sheath was introduced into the  right radial artery. A 5 [place] Tig 4 catheter was advanced into the ascending aorta selective   left coronary artery was cannulated and angiography was performed in multiple views. The catheter was pulled back Out of the body over exchange length J-wire.  The same  Catheter was used to perform LV gram which was performed in RAO projection. I then exchanged the tiger 4 catheter to a 5 [place] JR4 diagnostic catheter and right coronary artery was engaged and angiography was performed. Catheter exchanged out of the body over J-Wire. NO immediate complications noted. Patient tolerated the procedure well. A total of 95 mL of contrast was utilized for diagnostic angiography.  Disposition: Will be discharged home today with outpatient follow up.

## 2014-08-27 NOTE — Interval H&P Note (Signed)
Beaulah Corin student observing and patient consents  Laverda Page

## 2014-08-27 NOTE — Discharge Summary (Signed)
Physician Discharge Summary  Patient ID: Krista Clarke MRN: [id number] DOB/AGE: March 28, 1941 73 y.o.  Admit date: 08/25/2014 Discharge date: 08/27/2014  Primary Discharge Diagnosis 1. Chest pain probably noncardiac, hypertensive heart disease with subendocardial ischemia with normal coronary arteries giving origin to angina pectoris cannot be completely excluded however appears to be less likely. 2. Chronic iron deficiency anemia Secondary Discharge Diagnosis 3. Diabetes mellitus, type II controlled 4. Hypertension with hypertensive heart disease 5. Left bundle branch block, chronic 6. Hyperlipidemia  Significant Diagnostic Studies: Coronary angiogram 08/27/2014: Hemodynamic data:  Left ventricular pressure was 109/6 with LVEDP of 10 mm mercury. Aortic pressure was 96/51 with a mean of 67 mm mercury. There was no pressure gradient across the aortic valve  Left ventricle: Performed in the RAO projection revealed LVEF of 60 %. There was no obvious significant MR. no wall motion abnormality.  Right coronary artery: The vessel is smooth, normal, Dominant. It is tortuous and has an anterior origin.  Left main coronary artery is large and normal.  Circumflex coronary artery: A large vessel giving origin to a large obtuse marginal 1. It is smooth and normal. It is severely tortuous.  LAD: LAD gives origin to a large diagonal-1. LAD is severely tortuous.  Ramus intermediate: Smooth and normal, again tortuosity is evident.  Hospital Course: patient is a 73 year-old African-American female with history of hypertension, hyperlipidemia, left bundle branch block, chest pain syndrome, Diabetes Mellitus who presents with chest pain to the emergency room, treated with sublingual nitroglycerin without any recurrence. She was admitted to the hospital with diagnosis of intermediate coronary syndrome. About a year ago she has had a normal nuclear stress test but was scheduled for outpatient coronary  angiography due to recurrent chest discomfort in multiple car vascular risk factors. She was ruled out for myocardial infarction, she had no recurrence of chest pain while in the hospital. The following morning she underwent coronary angiography which is dictated above and was found to have normal coronary arteries.   Recommendations on discharge:  Suspect chest pain due to hypertensive heart disease. Severe tortuosity of the coronary vessels suggest hypertensive heart disease. Her chest pain could also be secondary to GI etiology. I would evaluate for noncardiac causes of chest pain first. Patient has chronic anemia, may need further GI evaluation.  Discharge Exam: Blood pressure 141/48, pulse 65, temperature 98 F (36.7 C), temperature source Oral, resp. rate 16, height 5\' 2"  (1.575 m), weight 94.983 kg (209 lb 6.4 oz), SpO2 98 %.  General appearance: alert, cooperative, appears stated age and no distress Eyes: negative Neck: no adenopathy, no carotid bruit, no JVD, supple, symmetrical, trachea midline and thyroid not enlarged, symmetric, no tenderness/mass/nodules Neck: JVP - normal, carotids 2+= without bruits Resp: clear to auscultation bilaterally Chest wall: no tenderness Cardio: regular rate and rhythm, S1: normal and S2: physiologically split GI: soft, non-tender; bowel sounds normal; no masses, no organomegaly Extremities: edema 1+ bilaterally Pulses: 2+ and symmetric Labs:   Lab Results  Component Value Date   WBC 5.5 08/27/2014   HGB 9.8* 08/27/2014   HCT 31.3* 08/27/2014   MCV 75.8* 08/27/2014   PLT 253 08/27/2014    Recent Labs Lab 08/27/14 0352  NA 135  K 3.6  CL 98  CO2 24  BUN 20  CREATININE 0.90  CALCIUM 9.5  GLUCOSE 130*   Lab Results  Component Value Date   TROPONINI <0.03 08/26/2014    Lipid Panel     Component Value Date/Time  CHOL 175 08/27/2014 0352   TRIG 146 08/27/2014 0352   HDL 52 08/27/2014 0352   CHOLHDL 3.4 08/27/2014 0352   VLDL 29  08/27/2014 0352   LDLCALC 94 08/27/2014 0352   EKG: sinus rhythm, LBBB, unchanged from previous tracings.   Radiology: Dg Chest 2 View  08/26/2014   CLINICAL DATA:  Left-sided chest pain beginning tonight. History of hypertension and diabetes.  EXAM: CHEST  2 VIEW  COMPARISON:  12/04/2013  FINDINGS: The heart size and mediastinal contours are within normal limits. Both lungs are clear. The visualized skeletal structures are unremarkable.  IMPRESSION: No active cardiopulmonary disease.   Electronically Signed   By: Rolm Baptise M.D.   On: 08/26/2014 00:20   Mm Digital Screening Bilateral  08/06/2014   CLINICAL DATA:  Screening.  EXAM: DIGITAL SCREENING BILATERAL MAMMOGRAM WITH CAD  COMPARISON:  Previous exam(s).  ACR Breast Density Category b: There are scattered areas of fibroglandular density.  FINDINGS: There are no findings suspicious for malignancy. Images were processed with CAD.  IMPRESSION: No mammographic evidence of malignancy. A result letter of this screening mammogram will be mailed directly to the patient.  RECOMMENDATION: Screening mammogram in one year. (Code:SM-B-01Y)  BI-RADS CATEGORY  1: Negative.   Electronically Signed   By: Shon Hale M.D.   On: 08/06/2014 11:31   FOLLOW UP PLANS AND APPOINTMENTS    Medication List    STOP taking these medications        aspirin EC 81 MG tablet      TAKE these medications        acetaminophen 500 MG tablet  Commonly known as:  TYLENOL  Take 1,000 mg by mouth every 6 (six) hours as needed. For pain     allopurinol 100 MG tablet  Commonly known as:  ZYLOPRIM  Take 100 mg by mouth every morning.     amLODipine 2.5 MG tablet  Commonly known as:  NORVASC  Take 2.5 mg by mouth at bedtime.     CALCIUM CITRATE + D PO  Take 1 tablet by mouth daily.     ferrous sulfate 325 (65 FE) MG EC tablet  Take 325 mg by mouth 2 (two) times daily.     hydrochlorothiazide 12.5 MG capsule  Commonly known as:  MICROZIDE  Take 12.5 mg by  mouth every evening.     irbesartan-hydrochlorothiazide 150-12.5 MG per tablet  Commonly known as:  AVALIDE  Take 1 tablet by mouth daily.     Magnesium 250 MG Tabs  Take 250 mg by mouth daily.     meclizine 25 MG tablet  Commonly known as:  ANTIVERT  Take 1 tablet (25 mg total) by mouth 3 (three) times daily as needed for dizziness.     metFORMIN 1000 MG tablet  Commonly known as:  GLUCOPHAGE  Take 1,000 mg by mouth 2 (two) times daily with a meal.     metoprolol 50 MG tablet  Commonly known as:  LOPRESSOR  Take 50 mg by mouth 2 (two) times daily.     nitroGLYCERIN 0.4 MG SL tablet  Commonly known as:  NITROSTAT  Place 0.4 mg under the tongue every 5 (five) minutes as needed for chest pain.     OVER THE COUNTER MEDICATION  Apply 1 application topically 2 (two) times daily as needed (For pain.). Arthritis Cream     PRESERVISION AREDS 2 PO  Take 1 capsule by mouth 2 (two) times daily.     sitaGLIPtin 100  MG tablet  Commonly known as:  JANUVIA  Take 50 mg by mouth daily as needed (takes as needed in evenings).     vitamin C 500 MG tablet  Commonly known as:  ASCORBIC ACID  Take 500 mg by mouth daily.        Laverda Page, MD 08/27/2014, 11:03 AM  Pager: [phone] Office: [phone] If no answer: [phone]

## 2014-11-14 ENCOUNTER — Other Ambulatory Visit: Payer: Self-pay | Admitting: Internal Medicine

## 2014-11-14 DIAGNOSIS — M79604 Pain in right leg: Secondary | ICD-10-CM

## 2014-11-14 DIAGNOSIS — R609 Edema, unspecified: Secondary | ICD-10-CM

## 2014-11-18 ENCOUNTER — Ambulatory Visit
Admission: RE | Admit: 2014-11-18 | Discharge: 2014-11-18 | Disposition: A | Discharge status: Home / Self Care | Payer: Medicare Other | Source: Ambulatory Visit | Attending: Internal Medicine | Admitting: Internal Medicine

## 2014-11-18 DIAGNOSIS — R609 Edema, unspecified: Secondary | ICD-10-CM

## 2014-11-18 DIAGNOSIS — M79604 Pain in right leg: Secondary | ICD-10-CM

## 2014-11-22 ENCOUNTER — Ambulatory Visit
Admission: RE | Admit: 2014-11-22 | Discharge: 2014-11-22 | Disposition: A | Discharge status: Home / Self Care | Payer: Medicare Other | Source: Ambulatory Visit | Attending: Internal Medicine | Admitting: Internal Medicine

## 2014-11-22 DIAGNOSIS — M79604 Pain in right leg: Secondary | ICD-10-CM

## 2014-11-22 DIAGNOSIS — R609 Edema, unspecified: Secondary | ICD-10-CM

## 2015-01-24 ENCOUNTER — Other Ambulatory Visit: Payer: Self-pay

## 2015-01-24 ENCOUNTER — Other Ambulatory Visit: Payer: Self-pay | Admitting: Internal Medicine

## 2015-01-24 DIAGNOSIS — E559 Vitamin D deficiency, unspecified: Secondary | ICD-10-CM

## 2015-01-30 ENCOUNTER — Other Ambulatory Visit: Payer: Medicare Other

## 2015-01-31 ENCOUNTER — Ambulatory Visit
Admission: RE | Admit: 2015-01-31 | Discharge: 2015-01-31 | Disposition: A | Discharge status: Home / Self Care | Payer: Medicare Other | Source: Ambulatory Visit | Attending: Internal Medicine | Admitting: Internal Medicine

## 2015-01-31 DIAGNOSIS — E559 Vitamin D deficiency, unspecified: Secondary | ICD-10-CM

## 2015-08-07 ENCOUNTER — Emergency Department (HOSPITAL_COMMUNITY)
Admission: EM | Admit: 2015-08-07 | Discharge: 2015-08-08 | Disposition: A | Discharge status: Home / Self Care | Payer: Medicare Other | Attending: Emergency Medicine | Admitting: Emergency Medicine

## 2015-08-07 ENCOUNTER — Encounter (HOSPITAL_COMMUNITY): Payer: Self-pay | Admitting: Physical Medicine and Rehabilitation

## 2015-08-07 ENCOUNTER — Emergency Department (HOSPITAL_COMMUNITY): Payer: Medicare Other

## 2015-08-07 DIAGNOSIS — M1990 Unspecified osteoarthritis, unspecified site: Secondary | ICD-10-CM | POA: Insufficient documentation

## 2015-08-07 DIAGNOSIS — Z79899 Other long term (current) drug therapy: Secondary | ICD-10-CM | POA: Diagnosis not present

## 2015-08-07 DIAGNOSIS — Z7984 Long term (current) use of oral hypoglycemic drugs: Secondary | ICD-10-CM | POA: Insufficient documentation

## 2015-08-07 DIAGNOSIS — E119 Type 2 diabetes mellitus without complications: Secondary | ICD-10-CM | POA: Diagnosis not present

## 2015-08-07 DIAGNOSIS — D649 Anemia, unspecified: Secondary | ICD-10-CM | POA: Diagnosis not present

## 2015-08-07 DIAGNOSIS — I10 Essential (primary) hypertension: Secondary | ICD-10-CM | POA: Diagnosis not present

## 2015-08-07 DIAGNOSIS — R944 Abnormal results of kidney function studies: Secondary | ICD-10-CM | POA: Insufficient documentation

## 2015-08-07 DIAGNOSIS — R0602 Shortness of breath: Secondary | ICD-10-CM | POA: Insufficient documentation

## 2015-08-07 DIAGNOSIS — R001 Bradycardia, unspecified: Secondary | ICD-10-CM | POA: Diagnosis not present

## 2015-08-07 LAB — I-STAT TROPONIN, ED: Troponin i, poc: 0 ng/mL (ref 0.00–0.08)

## 2015-08-07 LAB — CBC WITH DIFFERENTIAL/PLATELET
Basophils Absolute: 0 10*3/uL (ref 0.0–0.1)
Basophils Relative: 0 %
Eosinophils Absolute: 0.1 10*3/uL (ref 0.0–0.7)
Eosinophils Relative: 2 %
HCT: 32 % — ABNORMAL LOW (ref 36.0–46.0)
Hemoglobin: 9.9 g/dL — ABNORMAL LOW (ref 12.0–15.0)
Lymphocytes Relative: 27 %
Lymphs Abs: 1.7 10*3/uL (ref 0.7–4.0)
MCH: 24.4 pg — ABNORMAL LOW (ref 26.0–34.0)
MCHC: 30.9 g/dL (ref 30.0–36.0)
MCV: 78.8 fL (ref 78.0–100.0)
Monocytes Absolute: 0.5 10*3/uL (ref 0.1–1.0)
Monocytes Relative: 8 %
Neutro Abs: 4 10*3/uL (ref 1.7–7.7)
Neutrophils Relative %: 63 %
Platelets: 252 10*3/uL (ref 150–400)
RBC: 4.06 MIL/uL (ref 3.87–5.11)
RDW: 14.9 % (ref 11.5–15.5)
WBC: 6.2 10*3/uL (ref 4.0–10.5)

## 2015-08-07 LAB — COMPREHENSIVE METABOLIC PANEL
ALT: 20 U/L (ref 14–54)
AST: 20 U/L (ref 15–41)
Albumin: 4 g/dL (ref 3.5–5.0)
Alkaline Phosphatase: 60 U/L (ref 38–126)
Anion gap: 11 (ref 5–15)
BUN: 34 mg/dL — ABNORMAL HIGH (ref 6–20)
CO2: 24 mmol/L (ref 22–32)
Calcium: 9.7 mg/dL (ref 8.9–10.3)
Chloride: 103 mmol/L (ref 101–111)
Creatinine, Ser: 1.54 mg/dL — ABNORMAL HIGH (ref 0.44–1.00)
GFR calc Af Amer: 37 mL/min — ABNORMAL LOW (ref >60)
GFR calc non Af Amer: 32 mL/min — ABNORMAL LOW (ref >60)
Glucose, Bld: 223 mg/dL — ABNORMAL HIGH (ref 65–99)
Potassium: 4.5 mmol/L (ref 3.5–5.1)
Sodium: 138 mmol/L (ref 135–145)
Total Bilirubin: 0.4 mg/dL (ref 0.3–1.2)
Total Protein: 7.4 g/dL (ref 6.5–8.1)

## 2015-08-07 MED ORDER — SODIUM CHLORIDE 0.9 % IV BOLUS (SEPSIS)
1000.0000 mL | Freq: Once | INTRAVENOUS | Status: AC
  Administered 2015-08-07: 1000 mL via INTRAVENOUS

## 2015-08-07 NOTE — ED Provider Notes (Signed)
CSN: [id number]     Arrival date & time 08/07/15  2053 History   First MD Initiated Contact with Patient 08/07/15 2150     Chief Complaint  Patient presents with  . Shortness of Breath    Patient is a 74 y.o. female presenting with shortness of breath. The history is provided by the patient.  Shortness of Breath Severity:  Mild Onset quality:  Gradual Duration:  30 minutes Timing:  Constant Chronicity:  New Relieved by:  Nothing Associated symptoms: no abdominal pain, no chest pain, no fever, no headaches, no neck pain, no rash, no sore throat and no vomiting     Past Medical History  Diagnosis Date  . Hypertension   . Diabetes mellitus without complication ([place])   . Hypercholesteremia   . Arthritis   . Anemia     takes iron daily   Past Surgical History  Procedure Laterality Date  . Tubal ligation    . Joint replacement Bilateral     '99  . Abdominal hysterectomy    . Colonoscopy with propofol N/A 07/03/2013    Procedure: COLONOSCOPY WITH PROPOFOL;  Surgeon: Garlan Fair, MD;  Location: [facility] ENDOSCOPY;  Service: Endoscopy;  Laterality: N/A;  . Left heart catheterization with coronary angiogram N/A 08/27/2014    Procedure: LEFT HEART CATHETERIZATION WITH CORONARY ANGIOGRAM;  Surgeon: Laverda Page, MD;  Location: [facility] CATH LAB;  Service: Cardiovascular;  Laterality: N/A;   No family history on file. Social History  Substance Use Topics  . Smoking status: Never Smoker   . Smokeless tobacco: Never Used  . Alcohol Use: No   OB History    No data available     Review of Systems  Constitutional: Negative for fever.  HENT: Negative for rhinorrhea and sore throat.   Eyes: Negative for visual disturbance.  Respiratory: Positive for shortness of breath. Negative for chest tightness.   Cardiovascular: Negative for chest pain and palpitations.  Gastrointestinal: Negative for nausea, vomiting, abdominal pain and constipation.  Genitourinary: Negative for dysuria and  hematuria.  Musculoskeletal: Negative for back pain and neck pain.  Skin: Negative for rash.  Neurological: Negative for dizziness and headaches.  Psychiatric/Behavioral: Negative for confusion.  All other systems reviewed and are negative.  Allergies  Meclizine and Prednisone  Home Medications   Prior to Admission medications   Medication Sig Start Date End Date Taking? Authorizing Provider  acetaminophen (TYLENOL) 500 MG tablet Take 1,000 mg by mouth every 6 (six) hours as needed. For pain   Yes Historical Provider, MD  allopurinol (ZYLOPRIM) 100 MG tablet Take 100 mg by mouth every morning.    Yes Historical Provider, MD  amLODipine (NORVASC) 2.5 MG tablet Take 2.5 mg by mouth at bedtime.    Yes Historical Provider, MD  Calcium Citrate-Vitamin D (CALCIUM CITRATE + D PO) Take 1 tablet by mouth daily.   Yes Historical Provider, MD  ferrous sulfate 325 (65 FE) MG EC tablet Take 325 mg by mouth 2 (two) times daily.   Yes Historical Provider, MD  hydrochlorothiazide (MICROZIDE) 12.5 MG capsule Take 12.5 mg by mouth every evening.    Yes Historical Provider, MD  irbesartan-hydrochlorothiazide (AVALIDE) 150-12.5 MG per tablet Take 1 tablet by mouth daily.   Yes Historical Provider, MD  Magnesium 250 MG TABS Take 250 mg by mouth daily.   Yes Historical Provider, MD  metFORMIN (GLUCOPHAGE) 1000 MG tablet Take 1,000 mg by mouth 2 (two) times daily with a meal.   Yes Historical  Provider, MD  metoprolol (LOPRESSOR) 50 MG tablet Take 50 mg by mouth 2 (two) times daily.   Yes Historical Provider, MD  Multiple Vitamins-Minerals (PRESERVISION AREDS 2 PO) Take 1 capsule by mouth 2 (two) times daily.   Yes Historical Provider, MD  nitroGLYCERIN (NITROSTAT) 0.4 MG SL tablet Place 0.4 mg under the tongue every 5 (five) minutes as needed for chest pain.   Yes Historical Provider, MD  OVER THE COUNTER MEDICATION Apply 1 application topically 2 (two) times daily as needed (For pain.). Arthritis Cream   Yes  Historical Provider, MD  sitaGLIPtin (JANUVIA) 100 MG tablet Take 50 mg by mouth daily as needed (takes as needed in evenings).    Yes Historical Provider, MD  spironolactone (ALDACTONE) 25 MG tablet Take 25 mg by mouth daily. 07/30/15   Historical Provider, MD  vitamin C (ASCORBIC ACID) 500 MG tablet Take 500 mg by mouth daily.    Historical Provider, MD   BP 188/56 mmHg  Pulse 51  Temp(Src) 97.7 F (36.5 C) (Oral)  Resp 18  Ht 5' (1.524 m)  Wt 90.719 kg  BMI 39.06 kg/m2  SpO2 98% Physical Exam  Constitutional: She is oriented to person, place, and time. She appears well-developed and well-nourished. No distress.  HENT:  Head: Normocephalic and atraumatic.  Mouth/Throat: Oropharynx is clear and moist.  Eyes: EOM are normal. Pupils are equal, round, and reactive to light.  Neck: Neck supple. No JVD present.  Cardiovascular: Regular rhythm, normal heart sounds and intact distal pulses.  Bradycardia present.  Exam reveals no gallop.   No murmur heard. Pulmonary/Chest: Effort normal and breath sounds normal. She has no wheezes. She has no rales.  Abdominal: Soft. She exhibits no distension. There is no tenderness.  Musculoskeletal: Normal range of motion. She exhibits no tenderness.  Neurological: She is alert and oriented to person, place, and time. No cranial nerve deficit. She exhibits normal muscle tone.  Skin: Skin is warm and dry. No rash noted.  Psychiatric: Her behavior is normal.    ED Course  Procedures  None   Labs Review Labs Reviewed  CBC WITH DIFFERENTIAL/PLATELET - Abnormal; Notable for the following:    Hemoglobin 9.9 (*)    HCT 32.0 (*)    MCH 24.4 (*)    All other components within normal limits  COMPREHENSIVE METABOLIC PANEL - Abnormal; Notable for the following:    Glucose, Bld 223 (*)    BUN 34 (*)    Creatinine, Ser 1.54 (*)    GFR calc non Af Amer 32 (*)    GFR calc Af Amer 37 (*)    All other components within normal limits  Randolm Idol, ED     Imaging Review Dg Chest 2 View  08/07/2015  CLINICAL DATA:  Shortness breath beginning yesterday. Hypertension and diabetes. Angina pectoris. EXAM: CHEST  2 VIEW COMPARISON:  08/26/2014 FINDINGS: Heart size remains at the upper limits of normal in size. Mild bibasilar scarring is stable. No evidence of pulmonary airspace disease or edema. No evidence of pleural effusion. Thoracic spine degenerative changes again noted. IMPRESSION: Stable exam.  No active cardiopulmonary disease. Electronically Signed   By: Earle Gell M.D.   On: 08/07/2015 21:26   I have personally reviewed and evaluated these images and lab results as part of my medical decision-making.   EKG Interpretation   Date/Time:  Thursday August 07 2015 21:05:18 EST Ventricular Rate:  48 PR Interval:  184 QRS Duration: 142 QT Interval:  566  QTC Calculation: 505 R Axis:   -4 Text Interpretation:  Sinus bradycardia Left bundle branch block Abnormal  ECG Confirmed by Wilson Singer  MD, STEPHEN ([id number]) on 08/07/2015 10:14:00 PM      MDM   Final diagnoses:  Decreased GFR  SOB (shortness of breath)  Bradycardia    Patient is a 74 yo American female with history of hypertension, diabetes and hypercholesterolemia who presents with generalized fatigue, an episode of dyspnea on exertion and bradycardia. Family is concerned because her heart rate remained in the 40s. However she is denying chest pain, syncope, lightheadedness. She is currently chest pain-free. Troponin negative. Lung and heart sounds unremarkable. Chest x-ray unremarkable. No nerves or edema. EKG with sinus bradycardia, no acute ischemic changes. Left bundle branch block. Clinically she appears well on exam. Hypertensive. Acute kidney injury noted on exam. However patient's last labs were a year ago said this could be new chronic kidney disease. Patient given 1 L normal saline bolus. He was feel she is stable for discharge. Instructed close PCP follow-up for lab recheck.  Patient voices understanding and is agreeable with this plan.  Discussed with DR. Kohut.  Gustavus Bryant, MD 08/08/15 0001  Virgel Manifold, MD 08/10/15 [phone]

## 2015-08-07 NOTE — ED Notes (Signed)
Pt presents to department for evaluation of SOB, bradycardia and bilateral leg swelling. Pt states she has been feeling bad for several days. Respirations unlabored at present. Pt is alert and oriented x4.

## 2015-08-07 NOTE — ED Notes (Signed)
MD at bedside. 

## 2015-08-08 MED ORDER — AMLODIPINE BESYLATE 5 MG PO TABS: 5.0000 mg | ORAL_TABLET | Freq: Every day | ORAL | Status: DC

## 2015-08-19 ENCOUNTER — Encounter: Payer: Self-pay | Admitting: Cardiology

## 2015-08-19 ENCOUNTER — Ambulatory Visit (INDEPENDENT_AMBULATORY_CARE_PROVIDER_SITE_OTHER): Payer: Medicare Other | Admitting: Cardiology

## 2015-08-19 ENCOUNTER — Encounter: Payer: Self-pay | Admitting: *Deleted

## 2015-08-19 VITALS — BP 140/72 | HR 43 | Ht 64.0 in | Wt 218.8 lb

## 2015-08-19 DIAGNOSIS — I441 Atrioventricular block, second degree: Secondary | ICD-10-CM | POA: Diagnosis not present

## 2015-08-19 DIAGNOSIS — Z01812 Encounter for preprocedural laboratory examination: Secondary | ICD-10-CM | POA: Diagnosis not present

## 2015-08-19 NOTE — Patient Instructions (Signed)
Medication Instructions:  Your physician recommends that you continue on your current medications as directed. Please refer to the Current Medication list given to you today.  Labwork: Your physician recommends that you return for pre procedure lab work on: 08/29/15  Testing/Procedures: Your physician has recommended that you have a pacemaker inserted. A pacemaker is a small device that is placed under the skin of your chest or abdomen to help control abnormal heart rhythms. This device uses electrical pulses to prompt the heart to beat at a normal rate. Pacemakers are used to treat heart rhythms that are too slow. Wire (leads) are attached to the pacemaker that goes into the chambers of you heart. This is done in the hospital and usually requires and overnight stay. Please see the instruction sheet given to you today for more information.  Follow-Up: Your physician recommends that you schedule a follow-up appointment in: 10-14 days, from 09/04/2015, in device clinic for wound check.  Your physician recommends that you schedule a follow-up appointment in: 3 months, from 09/04/2015, with Dr. Curt Bears.   If you need a refill on your cardiac medications before your next appointment, please call your pharmacy.  Thank you for choosing [facility]!!   Trinidad Curet, RN [phone]   Pacemaker Implantation The heart has its own electrical system, or natural pacemaker, to regulate the heartbeat. Sometimes, the natural pacemaker system of the heart fails and causes the heart to beat too slowly. If this happens, a pacemaker can be surgically placed to help the heart beat at a normal or programmed rate. A pacemaker is a small, battery-powered device that is placed under the skin and is programmed to sense your heartbeats. If your heart rate is lower than the programmed rate, the pacemaker will pace your heart. Parts of a pacemaker include:  Wires or leads. The leads are placed in the heart and  transmit electricity to the heart. The leads are connected to the pulse generator.  Pulse generator. The pulse generator contains a computer and a memory system. The pulse generator also produces the electrical signal that triggers the heart to beat. A pacemaker may be placed if:  You have a slow heartbeat (bradycardia).  You have fainting (syncope).  Shortness of breath (dyspnea) due to heart problems. LET [facility] A CARE PROVIDER KNOW ABOUT:  Any allergies you may have.  All medicines you are taking, including vitamins, herbs, eye drops, creams, and over-the-counter medicines.  Previous problems you or members of your family have had with the use of anesthetics.  Any blood disorders you have.  Previous surgeries you have had.  Medical conditions you have.  Possibility of pregnancy, if this applies. RISKS AND COMPLICATIONS Generally, pacemaker implantation is a safe procedure. However, problems can occur and include:  Bleeding.  Unable to place the pacemaker under local sedation.  Infection. BEFORE THE PROCEDURE  You will have blood work drawn before the procedure.  Do not use any tobacco products including cigarettes, chewing tobacco, or electronic cigarettes. If you need help quitting, ask your health care provider.  Do not eat or drink anything after midnight on the night before the procedure or as directed by your health care provider.  Ask your health care provider about:  Changing or stopping your regular medicines. This is especially important if you are taking diabetes medicines or blood thinners.  Taking medicines such as aspirin and ibuprofen. These medicines can thin your blood. Do not take these medicines before your procedure if your health care  provider asks you not to.  Ask your health care provider if you can take a sip of water with any approved medicines the morning of the procedure. PROCEDURE  The surgery to place a pacemaker is considered a  minimally invasive surgical procedure. It is done under a local anesthetic, which is an injection at the incision site that makes the skin numb. You are also given sedation and pain medicine that makes you drowsy during the procedure.   An intravenous line (IV) will be started in your hand or arm so sedation and pain medicine can be given during the pacemaker procedure.  A numbing medicine will be injected into the skin where the pacemaker is to be placed. A small incision will then be made into the skin. The pacemaker is usually placed under the skin near the collarbone.  After the incision has been made, the leads will be inserted into a large vein and guided into the heart using X-ray.  Using the same incision that was used to place the leads, a small pocket will be created under the skin to hold the pulse generator. The leads will then be connected to the pulse generator.  The incision site will then be closed. A bandage (dressing) is placed over the pacemaker site. The dressing is removed 24-48 hours afterward. AFTER THE PROCEDURE  You will be taken to a recovery area after the pacemaker implant. Your vital signs such as blood pressure, heart rate, breathing, and oxygen levels will be monitored.  A chest X-ray will be done after the pacemaker has been implanted. This is to make sure the pacemaker and leads are in the correct place.   This information is not intended to replace advice given to you by your health care provider. Make sure you discuss any questions you have with your health care provider.   Document Released: 08/06/2002 Document Revised: 09/06/2014 Document Reviewed: 12/21/2011 Elsevier Interactive Patient Education 2016 [employer]   Pacemaker Implantation, Care After Refer to this sheet over the next few weeks. These instructions provide you with information on caring for yourself after the procedure. Your health care provider may also give you more specific  instructions. Your treatment has been planned according to current medical practices, but problems sometimes occur. Call your health care provider if you have any problems or questions regarding your pacemaker.  WHAT TO EXPECT AFTER THE PROCEDURE  You may feel pain. Some pain is normal. It may last a few days.  A slight bump may be seen over the skin where the device was placed. Sometimes, it is possible to feel the device under the skin. This is normal.  In the months and years afterward, your health care provider will check the device, the leads, and the battery every few months. Eventually, when the battery is low, the device will be replaced. HOME CARE INSTRUCTIONS Medicines  Take medicines only as directed by your health care provider.  If you were prescribed an antibiotic medicine, finish it all even if you start to feel better.  Do not take any other medicines without asking your health care provider first. Some medicines, including certain painkillers, can cause bleeding in your stomach after surgery. Wound Care  Do not remove the bandage on your chest until directed to do so by your health care provider.  After your bandage is removed, you may see pieces of tape called skin adhesive strips over the area where the cut was made (incision site). Let them fall off on  their own.  Check the incision site every day to make sure it is not infected, bleeding, or starting to pull apart.  Do not use lotions or ointments near the incision site unless directed to do so.  Keep the incision area clean and dry for 2-3 days after the procedure or as directed by your health care provider. It takes several weeks for the incision site to completely heal.  Do not take baths, swim, or use a hot tub until your health care provider approves. Activities  Try to walk a little every day. Exercising is important after this procedure. It is also important to use your shoulder on the side of the pacemaker  in daily tasks that do not require exaggerated motion.  Avoid sudden jerking, pulling, or chopping movements that pull your upper arm far away from your body for at least 6 weeks.  Do not lift your upper arm above your shoulders for at least 6 weeks. This means no tennis, golf, or swimming for this period of time. If you sleep with the arm above your head, use a restraint to prevent this from happening as you sleep.  You may go back to work when your health care provider says it is okay. Check with your health care provider before you start to drive or play sports. Other Instructions  Follow diet instructions if they were provided. You should be able to eat what you usually do right away, but you may need to limit your salt intake.  Weigh yourself every day. If you suddenly gain weight, fluid may be building up in your body.  Always carry your pacemaker identification card with you. The card should list the implant date, device model, and manufacturer. Consider wearing a medical alert bracelet or necklace.  Tell all health care providers that you have a pacemaker. This may prevent them from giving you a magnetic resource imaging scan (MRI) because of the strong magnets used during that test.  If you must pass through a metal detector, quickly walk through it. Do not stop under the detector or stand near it.  Avoid places or objects with a strong electric or magnetic field, including:  Engineer, maintenance. When at the airport, let officials know you have a pacemaker. Your ID card will let you be checked in a way that is safe for you and that will not damage your pacemaker. Also, do not let a security person wave a magnetic wand near your pacemaker. That can make it stop working.  Power plants.  Large electrical generators.  Radiofrequency transmission towers, such as cell phone and radio towers.  Do not use amateur (ham) radio equipment or electric (arc) welding torches. Some devices  are safe to use if held at least 1 foot from your pacemaker. These include power tools, lawn mowers, and speakers. If you are unsure of whether something is safe to use, ask your health care provider.  You may safely use electric blankets, heating pads, computers, and microwave ovens.  When using your cell phone, hold it to the ear opposite the pacemaker. Do not leave your cell phone in a pocket over the pacemaker.  Keep all follow-up visits as directed by your health care provider. This is how your health care provider makes sure your chest is healing the way it should. Ask your health care provider when you should come back to have your stitches or staples taken out.  Have your pacemaker checked every 3-6 months or as directed by  your health care provider. Most pacemakers last for 4-8 years before a new one is needed. SEEK MEDICAL CARE IF:  You gain weight suddenly.  Your legs or feet swell more than they have before.  It feels like your heart is fluttering or skipping beats (heart palpitations).  You have a fever. SEEK IMMEDIATE MEDICAL CARE IF:  You have chest pain.  You feel more short of breath than you have felt before.  You feel more light-headed than you have felt before.  You have problems with your incision site, such as swelling or bleeding, or it starts to open up.  You have drainage, redness, swelling, or pain at your incision site.   This information is not intended to replace advice given to you by your health care provider. Make sure you discuss any questions you have with your health care provider.   Document Released: 03/05/2005 Document Revised: 09/06/2014 Document Reviewed: 12/17/2011 Elsevier Interactive Patient Education [employer].

## 2015-08-19 NOTE — Progress Notes (Signed)
Electrophysiology Office Note   Date:  08/19/2015   ID:  Shamarra, Ramone 02/04/1941, MRN [id number]  PCP:  Jilda Panda, MD  Cardiologist:  Clayton Lefort Primary Electrophysiologist:  Will Meredith Leeds, MD    Chief Complaint  Patient presents with  . New Patient (Initial Visit)  . heart block     History of Present Illness: Krista Clarke is a 74 y.o. female who presents today for electrophysiology evaluation.   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 There were no acute abnormalities at the time and her coronaries were otherwise normal. Her symptoms improved significantly with blood pressure control. She saw her PCP 2 weeks ago for fatigue and malaise and was noted to be bradycardic. Her metoprolol was discontinued at that time.her EKG one week later showed 2-1 heart block and a heart rate of 44. Today she says that she has been fatigued for the last few months. She has been unable to go to her mailbox or to cook or clean around the house. She states that every time she sits down she falls asleep. She is also not been going shopping with her daughter during this time.   Today, she denies symptoms of palpitations, , lower extremity edema, claudication, dizziness, presyncope, syncope, bleeding, or neurologic sequela. The patient is tolerating medications without difficulties and is otherwise without complaint today.    Past Medical History  Diagnosis Date  . Hypertension   . Diabetes mellitus without complication ([place])   . Hypercholesteremia   . Arthritis   . Anemia     takes iron daily   Past Surgical History  Procedure Laterality Date  . Tubal ligation    . Joint replacement Bilateral     '99  . Abdominal hysterectomy    . Colonoscopy with propofol N/A 07/03/2013    Procedure:  COLONOSCOPY WITH PROPOFOL;  Surgeon: Garlan Fair, MD;  Location: [facility] ENDOSCOPY;  Service: Endoscopy;  Laterality: N/A;  . Left heart catheterization with coronary angiogram N/A 08/27/2014    Procedure: LEFT HEART CATHETERIZATION WITH CORONARY ANGIOGRAM;  Surgeon: Laverda Page, MD;  Location[facility] CATH LAB;  Service: Cardiovascular;  Laterality: N/A;     Current Outpatient Prescriptions  Medication Sig Dispense Refill  . acetaminophen (TYLENOL) 500 MG tablet Take 1,000 mg by mouth every 6 (six) hours as needed. For pain    . allopurinol (ZYLOPRIM) 100 MG tablet Take 100 mg by mouth every morning.     Marland Kitchen amLODipine (NORVASC) 5 MG tablet Take 1 tablet (5 mg total) by mouth daily. 30 tablet 0  . Calcium Citrate-Vitamin D (CALCIUM CITRATE + D PO) Take 1 tablet by mouth daily.    . ferrous sulfate 325 (65 FE) MG EC tablet Take 325 mg by mouth 2 (two) times daily.    . hydrochlorothiazide (MICROZIDE) 12.5 MG capsule Take 12.5 mg by mouth every evening.     . irbesartan-hydrochlorothiazide (AVALIDE) 150-12.5 MG per tablet Take 1 tablet by mouth daily.    . meclizine (ANTIVERT) 12.5 MG tablet Take 12.5 mg by mouth 3 (three) times daily as needed.    . metFORMIN (GLUCOPHAGE) 1000 MG tablet Take 1,000 mg by mouth 2 (two) times daily with a meal.    . Multiple Vitamins-Minerals (PRESERVISION AREDS 2 PO) Take 1 capsule by  mouth 2 (two) times daily.    . nitroGLYCERIN (NITROSTAT) 0.4 MG SL tablet Place 0.4 mg under the tongue every 5 (five) minutes as needed for chest pain.    Marland Kitchen OVER THE COUNTER MEDICATION Apply 1 application topically 2 (two) times daily as needed (For pain.). Arthritis Cream    . sitaGLIPtin (JANUVIA) 100 MG tablet Take 50 mg by mouth daily as needed (takes as needed in evenings).     Marland Kitchen spironolactone (ALDACTONE) 25 MG tablet Take 25 mg by mouth daily.    . vitamin C (ASCORBIC ACID) 500 MG tablet Take 500 mg by mouth daily.     No current facility-administered medications for this  visit.    Allergies:   Meclizine and Prednisone   Social History:  The patient  reports that she has never smoked. She has never used smokeless tobacco. She reports that she does not drink alcohol or use illicit drugs.   Family History:  The patient's family history includes Cancer in her brother; Diabetes in her mother; Hypertension in her father and mother.    ROS:  Please see the history of present illness.   Otherwise, review of systems is positive for fatigue, chest pain, swelling, DOE, orthopnea, palpitations, balance problems, dizziness.   All other systems are reviewed and negative.    PHYSICAL EXAM: VS:  BP 140/72 mmHg  Pulse 43  Ht 5\' 4"  (1.626 m)  Wt 218 lb 12.8 oz (99.247 kg)  BMI 37.54 kg/m2 , BMI Body mass index is 37.54 kg/(m^2). GEN: Well nourished, well developed, in no acute distress HEENT: normal Neck: no JVD, carotid bruits, or masses Cardiac: RRR; no murmurs, rubs, or gallops,no edema  Respiratory:  clear to auscultation bilaterally, normal work of breathing GI: soft, nontender, nondistended, + BS MS: no deformity or atrophy Skin: warm and dry Neuro:  Strength and sensation are intact Psych: euthymic mood, full affect  EKG:  EKG is ordered today. The ekg ordered today shows sinus rhythm, LBBB, 2:1 AV block  Recent Labs: 08/07/2015: ALT 20; BUN 34*; Creatinine, Ser 1.54*; Hemoglobin 9.9*; Platelets 252; Potassium 4.5; Sodium 138    Lipid Panel     Component Value Date/Time   CHOL 175 08/27/2014 0352   TRIG 146 08/27/2014 0352   HDL 52 08/27/2014 0352   CHOLHDL 3.4 08/27/2014 0352   VLDL 29 08/27/2014 0352   LDLCALC 94 08/27/2014 0352     Wt Readings from Last 3 Encounters:  08/19/15 218 lb 12.8 oz (99.247 kg)  08/07/15 200 lb (90.719 kg)  08/27/14 209 lb 6.4 oz (94.983 kg)    TTE 12/11/14: mild concentric LVH, EF 63%, trace mitral regurgitation, trace tricuspid regurgitation.  ASSESSMENT AND PLAN:  1.  2:1 AV block:patient with complaints of  extreme fatigue, weakness, dizziness, and falling asleep every time she sits down. It seems that she has had the symptoms for the last few months. Per her daughter she is unable to go shopping anymore and does not cook or clean around the house. She is in 2-1 heart block today as she was at her primary cardiologist visit. It is likely that her bradycardia is causing some of her symptoms. We will therefore plan on pacemaker placement. I offered her placement prior to the holidays but her and her family have declined and wished to do this after the new year. I have told them that if she becomes significantly more fatigued, that she should be brought to the hospital and evaluated for further bradycardia.  She may need a pacemaker at that time.    Current medicines are reviewed at length with the patient today.   The patient does not have concerns regarding her medicines.  The following changes were made today:  none  Labs/ tests ordered today include:  No orders of the defined types were placed in this encounter.     Disposition:   FU with Will Camnitz 3 months  Signed, Will Meredith Leeds, MD  08/19/2015 11:11 AM     [facility] [address] [place] [address] [phone] (office) [phone] (fax)

## 2015-08-20 ENCOUNTER — Encounter (HOSPITAL_COMMUNITY): Payer: Self-pay | Admitting: Emergency Medicine

## 2015-08-20 ENCOUNTER — Emergency Department (HOSPITAL_COMMUNITY): Payer: Medicare Other

## 2015-08-20 ENCOUNTER — Ambulatory Visit (HOSPITAL_COMMUNITY)
Admission: EM | Admit: 2015-08-20 | Discharge: 2015-08-21 | Disposition: A | Discharge status: Home / Self Care | Payer: Medicare Other | Attending: Cardiology | Admitting: Cardiology

## 2015-08-20 ENCOUNTER — Encounter (HOSPITAL_COMMUNITY)
Admission: EM | Disposition: A | Discharge status: Home / Self Care | Payer: Medicare Other | Source: Home / Self Care | Attending: Cardiology

## 2015-08-20 DIAGNOSIS — M1990 Unspecified osteoarthritis, unspecified site: Secondary | ICD-10-CM | POA: Insufficient documentation

## 2015-08-20 DIAGNOSIS — R001 Bradycardia, unspecified: Secondary | ICD-10-CM | POA: Insufficient documentation

## 2015-08-20 DIAGNOSIS — M5134 Other intervertebral disc degeneration, thoracic region: Secondary | ICD-10-CM | POA: Diagnosis not present

## 2015-08-20 DIAGNOSIS — I129 Hypertensive chronic kidney disease with stage 1 through stage 4 chronic kidney disease, or unspecified chronic kidney disease: Secondary | ICD-10-CM | POA: Diagnosis not present

## 2015-08-20 DIAGNOSIS — Z8679 Personal history of other diseases of the circulatory system: Secondary | ICD-10-CM

## 2015-08-20 DIAGNOSIS — Z7984 Long term (current) use of oral hypoglycemic drugs: Secondary | ICD-10-CM | POA: Insufficient documentation

## 2015-08-20 DIAGNOSIS — I447 Left bundle-branch block, unspecified: Secondary | ICD-10-CM | POA: Diagnosis not present

## 2015-08-20 DIAGNOSIS — I442 Atrioventricular block, complete: Secondary | ICD-10-CM

## 2015-08-20 DIAGNOSIS — Z8249 Family history of ischemic heart disease and other diseases of the circulatory system: Secondary | ICD-10-CM | POA: Diagnosis not present

## 2015-08-20 DIAGNOSIS — Z950 Presence of cardiac pacemaker: Secondary | ICD-10-CM | POA: Insufficient documentation

## 2015-08-20 DIAGNOSIS — D649 Anemia, unspecified: Secondary | ICD-10-CM | POA: Diagnosis not present

## 2015-08-20 DIAGNOSIS — N182 Chronic kidney disease, stage 2 (mild): Secondary | ICD-10-CM | POA: Diagnosis not present

## 2015-08-20 DIAGNOSIS — E1122 Type 2 diabetes mellitus with diabetic chronic kidney disease: Secondary | ICD-10-CM | POA: Diagnosis not present

## 2015-08-20 DIAGNOSIS — E7800 Pure hypercholesterolemia, unspecified: Secondary | ICD-10-CM | POA: Diagnosis not present

## 2015-08-20 HISTORY — DX: Atrioventricular block, complete: I44.2

## 2015-08-20 HISTORY — PX: EP IMPLANTABLE DEVICE: SHX172B

## 2015-08-20 HISTORY — DX: Presence of cardiac pacemaker: Z95.0

## 2015-08-20 LAB — BASIC METABOLIC PANEL
ANION GAP: 9 (ref 5–15)
BUN: 36 mg/dL — ABNORMAL HIGH (ref 6–20)
CALCIUM: 10 mg/dL (ref 8.9–10.3)
CO2: 26 mmol/L (ref 22–32)
CREATININE: 1.29 mg/dL — AB (ref 0.44–1.00)
Chloride: 103 mmol/L (ref 101–111)
GFR, EST AFRICAN AMERICAN: 46 mL/min — AB (ref >60)
GFR, EST NON AFRICAN AMERICAN: 39 mL/min — AB (ref >60)
Glucose, Bld: 175 mg/dL — ABNORMAL HIGH (ref 65–99)
Potassium: 5 mmol/L (ref 3.5–5.1)
SODIUM: 138 mmol/L (ref 135–145)

## 2015-08-20 LAB — URINE MICROSCOPIC-ADD ON: RBC / HPF: NONE SEEN RBC/hpf (ref 0–5)

## 2015-08-20 LAB — CBC WITH DIFFERENTIAL/PLATELET
Basophils Absolute: 0 10*3/uL (ref 0.0–0.1)
Basophils Relative: 0 %
Eosinophils Absolute: 0.1 10*3/uL (ref 0.0–0.7)
Eosinophils Relative: 1 %
HCT: 32.7 % — ABNORMAL LOW (ref 36.0–46.0)
HEMOGLOBIN: 10.3 g/dL — AB (ref 12.0–15.0)
LYMPHS ABS: 1.3 10*3/uL (ref 0.7–4.0)
LYMPHS PCT: 24 %
MCH: 24.8 pg — AB (ref 26.0–34.0)
MCHC: 31.5 g/dL (ref 30.0–36.0)
MCV: 78.6 fL (ref 78.0–100.0)
Monocytes Absolute: 0.4 10*3/uL (ref 0.1–1.0)
Monocytes Relative: 7 %
Neutro Abs: 3.6 10*3/uL (ref 1.7–7.7)
Neutrophils Relative %: 68 %
Platelets: 257 10*3/uL (ref 150–400)
RBC: 4.16 MIL/uL (ref 3.87–5.11)
RDW: 14.9 % (ref 11.5–15.5)
WBC: 5.3 10*3/uL (ref 4.0–10.5)

## 2015-08-20 LAB — CBG MONITORING, ED: GLUCOSE-CAPILLARY: 160 mg/dL — AB (ref 65–99)

## 2015-08-20 LAB — URINALYSIS, ROUTINE W REFLEX MICROSCOPIC
BILIRUBIN URINE: NEGATIVE
GLUCOSE, UA: NEGATIVE mg/dL
Hgb urine dipstick: NEGATIVE
KETONES UR: NEGATIVE mg/dL
Leukocytes, UA: NEGATIVE
Nitrite: NEGATIVE
PH: 6.5 (ref 5.0–8.0)
Protein, ur: 100 mg/dL — AB
SPECIFIC GRAVITY, URINE: 1.015 (ref 1.005–1.030)

## 2015-08-20 LAB — TSH: TSH: 3.02 u[IU]/mL (ref 0.350–4.500)

## 2015-08-20 LAB — GLUCOSE, CAPILLARY: GLUCOSE-CAPILLARY: 113 mg/dL — AB (ref 65–99)

## 2015-08-20 LAB — I-STAT TROPONIN, ED: TROPONIN I, POC: 0 ng/mL (ref 0.00–0.08)

## 2015-08-20 LAB — BRAIN NATRIURETIC PEPTIDE: B Natriuretic Peptide: 175.4 pg/mL — ABNORMAL HIGH (ref 0.0–100.0)

## 2015-08-20 LAB — MAGNESIUM: Magnesium: 1.8 mg/dL (ref 1.7–2.4)

## 2015-08-20 SURGERY — PACEMAKER IMPLANT

## 2015-08-20 MED ORDER — ALLOPURINOL 100 MG PO TABS
100.0000 mg | ORAL_TABLET | Freq: Every morning | ORAL | Status: DC
  Administered 2015-08-20: 20:00:00 100 mg via ORAL
  Filled 2015-08-20 (×2): qty 1

## 2015-08-20 MED ORDER — IRBESARTAN-HYDROCHLOROTHIAZIDE 150-12.5 MG PO TABS: 1.0000 | ORAL_TABLET | Freq: Every day | ORAL | Status: DC

## 2015-08-20 MED ORDER — SPIRONOLACTONE 25 MG PO TABS: 25.0000 mg | ORAL_TABLET | Freq: Every day | ORAL | Status: DC

## 2015-08-20 MED ORDER — LIDOCAINE HCL (PF) 1 % IJ SOLN
INTRAMUSCULAR | Status: AC
  Filled 2015-08-20: qty 30

## 2015-08-20 MED ORDER — CEFAZOLIN SODIUM-DEXTROSE 2-3 GM-% IV SOLR
INTRAVENOUS | Status: AC
  Filled 2015-08-20: qty 50

## 2015-08-20 MED ORDER — IRBESARTAN 75 MG PO TABS
150.0000 mg | ORAL_TABLET | Freq: Every day | ORAL | Status: DC
  Administered 2015-08-20 – 2015-08-21 (×2): 150 mg via ORAL
  Filled 2015-08-20 (×2): qty 2

## 2015-08-20 MED ORDER — ACETAMINOPHEN 325 MG PO TABS
325.0000 mg | ORAL_TABLET | ORAL | Status: DC | PRN
  Administered 2015-08-20 – 2015-08-21 (×2): 650 mg via ORAL
  Filled 2015-08-20 (×2): qty 2

## 2015-08-20 MED ORDER — SPIRONOLACTONE 25 MG PO TABS
25.0000 mg | ORAL_TABLET | Freq: Every day | ORAL | Status: DC
  Administered 2015-08-20 – 2015-08-21 (×2): 25 mg via ORAL
  Filled 2015-08-20 (×3): qty 1

## 2015-08-20 MED ORDER — ONDANSETRON HCL 4 MG/2ML IJ SOLN: 4.0000 mg | Freq: Four times a day (QID) | INTRAMUSCULAR | Status: DC | PRN

## 2015-08-20 MED ORDER — CEFAZOLIN SODIUM-DEXTROSE 2-3 GM-% IV SOLR
2.0000 g | INTRAVENOUS | Status: AC
  Administered 2015-08-20: 2 g via INTRAVENOUS

## 2015-08-20 MED ORDER — SODIUM CHLORIDE 0.9 % IV SOLN: INTRAVENOUS | Status: DC

## 2015-08-20 MED ORDER — INSULIN ASPART 100 UNIT/ML ~~LOC~~ SOLN: 0.0000 [IU] | Freq: Three times a day (TID) | SUBCUTANEOUS | Status: DC

## 2015-08-20 MED ORDER — YOU HAVE A PACEMAKER BOOK
Freq: Once | Status: AC
  Administered 2015-08-20: 20:00:00
  Filled 2015-08-20: qty 1

## 2015-08-20 MED ORDER — IRBESARTAN 75 MG PO TABS: 150.0000 mg | ORAL_TABLET | Freq: Every day | ORAL | Status: DC

## 2015-08-20 MED ORDER — SODIUM CHLORIDE 0.9 % IR SOLN
Status: AC
  Filled 2015-08-20: qty 2

## 2015-08-20 MED ORDER — HYDROCHLOROTHIAZIDE 12.5 MG PO CAPS
12.5000 mg | ORAL_CAPSULE | Freq: Every day | ORAL | Status: DC
  Administered 2015-08-20 – 2015-08-21 (×2): 12.5 mg via ORAL
  Filled 2015-08-20 (×2): qty 1

## 2015-08-20 MED ORDER — CHLORHEXIDINE GLUCONATE 4 % EX LIQD: 60.0000 mL | Freq: Once | CUTANEOUS | Status: DC

## 2015-08-20 MED ORDER — HYDROCHLOROTHIAZIDE 12.5 MG PO CAPS: 12.5000 mg | ORAL_CAPSULE | Freq: Every day | ORAL | Status: DC

## 2015-08-20 MED ORDER — ALLOPURINOL 100 MG PO TABS: 100.0000 mg | ORAL_TABLET | Freq: Every morning | ORAL | Status: DC

## 2015-08-20 MED ORDER — MIDAZOLAM HCL 5 MG/5ML IJ SOLN
INTRAMUSCULAR | Status: AC
  Filled 2015-08-20: qty 5

## 2015-08-20 MED ORDER — HEPARIN (PORCINE) IN NACL 2-0.9 UNIT/ML-% IJ SOLN
INTRAMUSCULAR | Status: AC
  Filled 2015-08-20: qty 1000

## 2015-08-20 MED ORDER — SODIUM CHLORIDE 0.9 % IJ SOLN
3.0000 mL | Freq: Two times a day (BID) | INTRAMUSCULAR | Status: DC
  Administered 2015-08-20 – 2015-08-21 (×2): 3 mL via INTRAVENOUS

## 2015-08-20 MED ORDER — CEFAZOLIN SODIUM 1-5 GM-% IV SOLN
1.0000 g | Freq: Four times a day (QID) | INTRAVENOUS | Status: AC
  Administered 2015-08-20 – 2015-08-21 (×3): 1 g via INTRAVENOUS
  Filled 2015-08-20 (×3): qty 50

## 2015-08-20 MED ORDER — ACETAMINOPHEN 325 MG PO TABS: 650.0000 mg | ORAL_TABLET | ORAL | Status: DC | PRN

## 2015-08-20 MED ORDER — HYDROCHLOROTHIAZIDE 12.5 MG PO CAPS: 12.5000 mg | ORAL_CAPSULE | Freq: Every evening | ORAL | Status: DC

## 2015-08-20 MED ORDER — SODIUM CHLORIDE 0.9 % IJ SOLN: 3.0000 mL | INTRAMUSCULAR | Status: DC | PRN

## 2015-08-20 MED ORDER — ACETAMINOPHEN 500 MG PO TABS: 1000.0000 mg | ORAL_TABLET | Freq: Once | ORAL | Status: DC

## 2015-08-20 MED ORDER — FENTANYL CITRATE (PF) 100 MCG/2ML IJ SOLN
INTRAMUSCULAR | Status: DC | PRN
  Administered 2015-08-20 (×3): 12.5 ug via INTRAVENOUS

## 2015-08-20 MED ORDER — MIDAZOLAM HCL 5 MG/5ML IJ SOLN
INTRAMUSCULAR | Status: DC | PRN
  Administered 2015-08-20 (×3): 1 mg via INTRAVENOUS

## 2015-08-20 MED ORDER — SODIUM CHLORIDE 0.9 % IV SOLN: 250.0000 mL | INTRAVENOUS | Status: DC

## 2015-08-20 MED ORDER — AMLODIPINE BESYLATE 5 MG PO TABS
5.0000 mg | ORAL_TABLET | Freq: Every day | ORAL | Status: DC
  Administered 2015-08-20 – 2015-08-21 (×2): 5 mg via ORAL
  Filled 2015-08-20 (×2): qty 1

## 2015-08-20 MED ORDER — FENTANYL CITRATE (PF) 100 MCG/2ML IJ SOLN
INTRAMUSCULAR | Status: AC
  Filled 2015-08-20: qty 2

## 2015-08-20 MED ORDER — SODIUM CHLORIDE 0.9 % IR SOLN
80.0000 mg | Status: AC
  Administered 2015-08-20: 80 mg

## 2015-08-20 MED ORDER — AMLODIPINE BESYLATE 5 MG PO TABS
5.0000 mg | ORAL_TABLET | Freq: Every day | ORAL | Status: DC
Start: 2015-08-21 — End: 2015-08-20

## 2015-08-20 SURGICAL SUPPLY — 7 items
CABLE SURGICAL S-101-97-12 (CABLE) ×2 IMPLANT
LEAD TENDRIL SDX 2088TC-46CM (Lead) ×2 IMPLANT
LEAD TENDRIL SDX 2088TC-52CM (Lead) ×2 IMPLANT
PAD DEFIB LIFELINK (PAD) ×2 IMPLANT
PPM ASSURITY DR PM2240 (Pacemaker) ×2 IMPLANT
SHEATH CLASSIC 7F (SHEATH) ×4 IMPLANT
TRAY PACEMAKER INSERTION (PACKS) ×2 IMPLANT

## 2015-08-20 NOTE — H&P (Signed)
ELECTROPHYSIOLOGY CONSULT NOTE    Patient ID: Krista Clarke MRN: [id number], DOB/AGE: Nov 14, 1940 74 y.o.  Admit date: 08/20/2015 Date of Consult: 08/20/2015  Primary Physician: Jilda Panda, MD Primary Cardiologist: Dr. Einar Gip Electrophysiologist: Dr. Curt Bears  HPI: IVALEE Clarke is a 74 y.o. female comes to [facility] ER with c/o acutely increased weakness, felt this morning when she woke that she was weak, intermittently lightheaded, no near syncope or syncope, no CP.  She is accompanied by her daughter, they state that yesterday when she saw Dr. Curt Bears she had been feeling pretty well, and felt she could hold off on the PPM.  This morning though she felt like she was cold, weak, and was getting winded with minimal exertion.  Here in the ED supine, she reports she is feeling better then she was at home, but no energy.  Her HR is currently 41bpm, 2:1 heart block, BP is 153/90, 100% sat on 2L n/c O2.  She has PMHx of HTN, DM, CRI and more recently bradycardia  She was seen by her PCP 2 weeks ago for fatigue and malaise and was noted to be bradycardic. Her metoprolol was discontinued at that time, her EKG one week later showed 2-1 heart block and a heart rate of 44 and was refferred for EP consult. She was evaluated out patient by Dr. Curt Bears who recommended a PPM implant though she preferred to wait until after the holidays.  Her cardiac testing/history includes: TTE 12/11/14: mild concentric LVH, EF 63%, trace mitral regurgitation, trace tricuspid regurgitation.  She had a LHC 08/27/14 that showed: Left ventricle: Performed in the RAO projection revealed LVEF of 60 %. There was no obvious significant MR. no wall motion abnormality. Right coronary artery: The vessel is smooth, normal, Dominant. It is tortuous and has an anterior origin. Left main coronary artery is large and normal. Circumflex coronary artery: A large vessel giving origin to a large obtuse marginal 1. It is smooth and normal. It is  severely tortuous. LAD: LAD gives origin to a large diagonal-1. LAD is severely tortuous. Ramus intermediate: Smooth and normal, again tortuosity is evident. Impression: Suspect chest pain due to hypertensive heart disease. Severe tortuosity of the coronary vessels suggest hypertensive heart disease. Her chest pain could also be secondary to GI etiology. I would evaluate for noncardiac causes of chest pain first. Patient has chronic anemia, may need further GI evaluation.  Past Medical History  Diagnosis Date  . Hypertension   . Diabetes mellitus without complication ([place])   . Hypercholesteremia   . Arthritis   . Anemia     takes iron daily     Surgical History:  Past Surgical History  Procedure Laterality Date  . Tubal ligation    . Joint replacement Bilateral     '99  . Abdominal hysterectomy    . Colonoscopy with propofol N/A 07/03/2013    Procedure: COLONOSCOPY WITH PROPOFOL;  Surgeon: Garlan Fair, MD;  Location: [facility] ENDOSCOPY;  Service: Endoscopy;  Laterality: N/A;  . Left heart catheterization with coronary angiogram N/A 08/27/2014    Procedure: LEFT HEART CATHETERIZATION WITH CORONARY ANGIOGRAM;  Surgeon: Laverda Page, MD;  Location: [facility] CATH LAB;  Service: Cardiovascular;  Laterality: N/A;      (Not in a hospital admission)  Inpatient Medications:  . acetaminophen  1,000 mg Oral Once    Allergies:  Allergies  Allergen Reactions  . Meclizine Nausea And Vomiting    Made her feel more dizzy  . Prednisone  Social History   Social History  . Marital Status: Widowed    Spouse Name: N/A  . Number of Children: N/A  . Years of Education: N/A   Occupational History  . Not on file.   Social History Main Topics  . Smoking status: Never Smoker   . Smokeless tobacco: Never Used  . Alcohol Use: No  . Drug Use: No  . Sexual Activity: Not Currently   Other Topics Concern  . Not on file   Social History Narrative     Family History  Problem  Relation Age of Onset  . Hypertension Father   . Hypertension Mother   . Diabetes Mother   . Cancer Brother      Review of Systems: All other systems reviewed and are otherwise negative except as noted above.  Physical Exam: Filed Vitals:   08/20/15 1100 08/20/15 1112  BP: 184/46 184/46  Pulse: 45 45  Temp:  97.7 F (36.5 C)  TempSrc:  Oral  Resp: 14 16  SpO2: 100% 100%    GEN- The patient is morbidly obese and in NAD, alert and oriented x 3 today.   HEENT: normocephalic, atraumatic; sclera clear, conjunctiva pink; hearing intact; oropharynx clear; neck supple, no JVP Lymph- no cervical lymphadenopathy Lungs- Clear to ausculation bilaterally, normal work of breathing.  No wheezes, rales, rhonchi Heart- Regular rate and rhythm, bradycardic, no murmurs, rubs or gallops, PMI not laterally displaced GI- soft, non-tender, non-distended Extremities- are warm, no clubbing, cyanosis, or edema; DP/PT MS- no significant deformity or atrophy Skin- warm and dry, no rash or lesion Psych- euthymic mood, full affect Neuro- no gross deficits observed  Labs:   Lab Results  Component Value Date   WBC 5.3 08/20/2015   HGB 10.3* 08/20/2015   HCT 32.7* 08/20/2015   MCV 78.6 08/20/2015   PLT 257 08/20/2015    Recent Labs Lab 08/20/15 1155  NA 138  K 5.0  CL 103  CO2 26  BUN 36*  CREATININE 1.29*  CALCIUM 10.0  GLUCOSE 175*      Radiology/Studies:  Dg Chest 2 View 08/20/2015  CLINICAL DATA:  Weakness, shortness of breath, dizziness, diabetes mellitus, hypertension, pacemaker EXAM: CHEST  2 VIEW COMPARISON:  08/07/2015 FINDINGS: Enlargement of cardiac silhouette. Pulmonary vascular congestion. Mediastinal contours stable with stable prominence of the RIGHT hilum unchanged since 01/23/2007. Lungs clear. No pleural effusion or pneumothorax. Question mild osteosclerosis for age. Multilevel endplate spur formation thoracic spine. IMPRESSION: Enlargement of cardiac silhouette with  pulmonary vascular congestion. Stable mildly prominent RIGHT hilum. No acute infiltrate. Question mild diffuse osteosclerosis for age versus related to technique, could occur from metabolic bone disease and multiple other etiologies ; recommend correlation with laboratory data. Electronically Signed   By: Lavonia Dana M.D.   On: 08/20/2015 12:07    EKG: 2:1 AVblock, LBBB, 46bpm 08/07/15: 2:1 AVblock, LBBB, 48bpm 2015 had LBBB  TELEMETRY: 2:1 AVblock, appears she transiently Nimsi Males have brief CHB, HR 39-45bpm noted  Assessment and Plan:  1. Syptomatic bradycardia, heart block     She has been of her BB for a couple weeks     TSH is wnl, electrolytes are wnl     no reversible causes identified     BP appears stable     She Kelsie Kramp be admitted and planned for PPM implant, as of now for Friday  2. 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 HTN     Follow with meds  4.  DM   Signed, Tommye Standard, PA-C 08/20/2015 1:44 PM   I have seen and examined this patient with Tommye Standard.  Agree with above, note added to reflect my findings.  On exam, bradycardic rhythm, no murmurs, lungs clear.  Patient presented in 2:1 heart block with episodes of compelte heart block.  Due to the unstable nature of her rhythm, Alayjah Boehringer plan on pacemaker placement today.  She is NPO.  Have discussed risks including bleeding, infection, pneumothorax, tamponade.  Patient understands and has agreed to the procedure.  Malicia Blasdel M. Wells Mabe MD 08/20/2015 4:01 PM

## 2015-08-20 NOTE — H&P (Signed)
Patient ID: JEREMIE SALTARELLI MRN: [id number], DOB/AGE: [birth year] 74 y.o.  Admit date: 08/20/2015 Date of Consult: 08/20/2015  Primary Physician: Jilda Panda, MD Primary Cardiologist: Dr. Einar Gip Electrophysiologist: Dr. Curt Bears  HPI: Krista Clarke is a 74 y.o. female comes to [facility] ER with c/o acutely increased weakness, felt this morning when she woke that she was weak, intermittently lightheaded, no near syncope or syncope, no CP. She is accompanied by her daughter, they state that yesterday when she saw Dr. Curt Bears she had been feeling pretty well, and felt she could hold off on the PPM. This morning though she felt like she was cold, weak, and was getting winded with minimal exertion. Here in the ED supine, she reports she is feeling better then she was at home, but no energy. Her HR is currently 41bpm, 2:1 heart block, BP is 153/90, 100% sat on 2L n/c O2.  She has PMHx of HTN, DM, CRI and more recently bradycardia She was seen by her PCP 2 weeks ago for fatigue and malaise and was noted to be bradycardic. Her metoprolol was discontinued at that time, her EKG one week later showed 2-1 heart block and a heart rate of 44 and was refferred for EP consult. She was evaluated out patient by Dr. Curt Bears who recommended a PPM implant though she preferred to wait until after the holidays.  Her cardiac testing/history includes: TTE 12/11/14: mild concentric LVH, EF 63%, trace mitral regurgitation, trace tricuspid regurgitation.  She had a LHC 08/27/14 that showed: Left ventricle: Performed in the RAO projection revealed LVEF of 60 %. There was no obvious significant MR. no wall motion abnormality. Right coronary artery: The vessel is smooth, normal, Dominant. It is tortuous and has an anterior origin. Left main coronary artery is large and normal. Circumflex coronary artery: A large vessel giving origin to a large obtuse marginal 1. It is smooth and normal. It is severely tortuous. LAD: LAD gives origin  to a large diagonal-1. LAD is severely tortuous. Ramus intermediate: Smooth and normal, again tortuosity is evident. Impression: Suspect chest pain due to hypertensive heart disease. Severe tortuosity of the coronary vessels suggest hypertensive heart disease. Her chest pain could also be secondary to GI etiology. I would evaluate for noncardiac causes of chest pain first. Patient has chronic anemia, may need further GI evaluation.  Past Medical History  Diagnosis Date  . Hypertension   . Diabetes mellitus without complication ([place])   . Hypercholesteremia   . Arthritis   . Anemia     takes iron daily     Surgical History:  Past Surgical History  Procedure Laterality Date  . Tubal ligation    . Joint replacement Bilateral     '99  . Abdominal hysterectomy    . Colonoscopy with propofol N/A 07/03/2013    Procedure: COLONOSCOPY WITH PROPOFOL; Surgeon: Garlan Fair, MD; Location: [facility] ENDOSCOPY; Service: Endoscopy; Laterality: N/A;  . Left heart catheterization with coronary angiogram N/A 08/27/2014    Procedure: LEFT HEART CATHETERIZATION WITH CORONARY ANGIOGRAM; Surgeon: Laverda Page, MD; Location: [facility] CATH LAB; Service: Cardiovascular; Laterality: N/A;      (Not in a hospital admission)  Inpatient Medications:  . acetaminophen 1,000 mg Oral Once    Allergies:  Allergies  Allergen Reactions  . Meclizine Nausea And Vomiting    Made her feel more dizzy  . Prednisone     Social History   Social History  . Marital Status: Widowed    Spouse Name: N/A  .  Number of Children: N/A  . Years of Education: N/A   Occupational History  . Not on file.   Social History Main Topics  . Smoking status: Never Smoker   . Smokeless tobacco: Never Used  . Alcohol Use: No  . Drug Use: No  . Sexual Activity: Not Currently   Other Topics Concern  .  Not on file   Social History Narrative     Family History  Problem Relation Age of Onset  . Hypertension Father   . Hypertension Mother   . Diabetes Mother   . Cancer Brother      Review of Systems: All other systems reviewed and are otherwise negative except as noted above.  Physical Exam: Filed Vitals:   08/20/15 1100 08/20/15 1112  BP: 184/46 184/46  Pulse: 45 45  Temp:  97.7 F (36.5 C)  TempSrc:  Oral  Resp: 14 16  SpO2: 100% 100%    GEN- The patient is morbidly obese and in NAD, alert and oriented x 3 today.  HEENT: normocephalic, atraumatic; sclera clear, conjunctiva pink; hearing intact; oropharynx clear; neck supple, no JVP Lymph- no cervical lymphadenopathy Lungs- Clear to ausculation bilaterally, normal work of breathing. No wheezes, rales, rhonchi Heart- Regular rate and rhythm, bradycardic, no murmurs, rubs or gallops, PMI not laterally displaced GI- soft, non-tender, non-distended Extremities- are warm, no clubbing, cyanosis, or edema; DP/PT MS- no significant deformity or atrophy Skin- warm and dry, no rash or lesion Psych- euthymic mood, full affect Neuro- no gross deficits observed  Labs:  Lab Results  Component Value Date   WBC 5.3 08/20/2015   HGB 10.3* 08/20/2015   HCT 32.7* 08/20/2015   MCV 78.6 08/20/2015   PLT 257 08/20/2015    Recent Labs Lab 08/20/15 1155  NA 138  K 5.0  CL 103  CO2 26  BUN 36*  CREATININE 1.29*  CALCIUM 10.0  GLUCOSE 175*     Radiology/Studies:  Dg Chest 2 View 08/20/2015 CLINICAL DATA: Weakness, shortness of breath, dizziness, diabetes mellitus, hypertension, pacemaker EXAM: CHEST 2 VIEW COMPARISON: 08/07/2015 FINDINGS: Enlargement of cardiac silhouette. Pulmonary vascular congestion. Mediastinal contours stable with stable prominence of the RIGHT hilum unchanged since 01/23/2007. Lungs clear. No pleural  effusion or pneumothorax. Question mild osteosclerosis for age. Multilevel endplate spur formation thoracic spine. IMPRESSION: Enlargement of cardiac silhouette with pulmonary vascular congestion. Stable mildly prominent RIGHT hilum. No acute infiltrate. Question mild diffuse osteosclerosis for age versus related to technique, could occur from metabolic bone disease and multiple other etiologies ; recommend correlation with laboratory data. Electronically Signed By: Lavonia Dana M.D. On: 08/20/2015 12:07    EKG: 2:1 AVblock, LBBB, 46bpm 08/07/15: 2:1 AVblock, LBBB, 48bpm 2015 had LBBB  TELEMETRY: 2:1 AVblock, appears she transiently will have brief CHB, HR 39-45bpm noted  Assessment and Plan:  1. Syptomatic bradycardia, heart block  She has been of her BB for a couple weeks  TSH is wnl, electrolytes are wnl  no reversible causes identified  BP appears stable  She will be admitted and planned for PPM implant, as of now for Friday  2. 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 HTN  Follow with meds  4. DM   Signed, Tommye Standard, PA-C 08/20/2015 1:44 PM   I have seen and examined this patient with Tommye Standard. Agree with above, note added to reflect my findings. On exam, bradycardic rhythm, no murmurs, lungs clear. Patient presented in 2:1 heart block with episodes of compelte heart block. Due to  the unstable nature of her rhythm, will plan on pacemaker placement today. She is NPO. Have discussed risks including bleeding, infection, pneumothorax, tamponade. Patient understands and has agreed to the procedure.      EP Attending  Patient seen and examined. Agree with the findings as noted above. 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 I have discussed the risks/benefits/goals/expectations of PPM insertion and she wishes to proceed.  Mikle Bosworth.D.

## 2015-08-20 NOTE — ED Notes (Signed)
CBG 160 

## 2015-08-20 NOTE — ED Notes (Signed)
From home via [facility], due for pacemaker 1/5, seen at [facility] yesterday, presents for weakness, SOB, dizziness, VSS, CBG 181, NAD

## 2015-08-20 NOTE — ED Notes (Signed)
Patient transported to X-ray 

## 2015-08-20 NOTE — ED Provider Notes (Addendum)
CSN: [id number]     Arrival date & time 08/20/15  1052 History   First MD Initiated Contact with Patient 08/20/15 1053     Chief Complaint  Patient presents with  . Bradycardia  . Weakness     (Consider location/radiation/quality/duration/timing/severity/associated sxs/prior Treatment) Patient is a 74 y.o. female presenting with weakness and general illness. The history is provided by the patient.  Weakness Associated symptoms include chest pain and headaches. Pertinent negatives include no shortness of breath.  Illness Severity:  Moderate Onset quality:  Sudden Duration:  2 days Timing:  Constant Progression:  Worsening Chronicity:  New Associated symptoms: chest pain and headaches   Associated symptoms: no congestion, no fever, no myalgias, no nausea, no rhinorrhea, no shortness of breath, no vomiting and no wheezing    74 yo F with a chief complaint of weakness and pain up above her nipples. The weakness is been going on for quite some time. Patient states that the pain started this morning. She is unable to describe the pain cannot localize it nothing makes it better or worse. Patient has never had pain like this before. Patient was seen yesterday in cardiology clinic for possible pacemaker placement for a high level AV block. Patient was deferring at that time until after Christmas. Today feels that she is continued to be persistently weak and with this pain feels like she's unable to get around the house.   Past Medical History  Diagnosis Date  . Hypertension   . Diabetes mellitus without complication ([place])   . Hypercholesteremia   . Arthritis   . Anemia     takes iron daily   Past Surgical History  Procedure Laterality Date  . Tubal ligation    . Joint replacement Bilateral     '99  . Abdominal hysterectomy    . Colonoscopy with propofol N/A 07/03/2013    Procedure: COLONOSCOPY WITH PROPOFOL;  Surgeon: Garlan Fair, MD;  Location: [facility] ENDOSCOPY;  Service:  Endoscopy;  Laterality: N/A;  . Left heart catheterization with coronary angiogram N/A 08/27/2014    Procedure: LEFT HEART CATHETERIZATION WITH CORONARY ANGIOGRAM;  Surgeon: Laverda Page, MD;  Location: [facility] CATH LAB;  Service: Cardiovascular;  Laterality: N/A;   Family History  Problem Relation Age of Onset  . Hypertension Father   . Hypertension Mother   . Diabetes Mother   . Cancer Brother    Social History  Substance Use Topics  . Smoking status: Never Smoker   . Smokeless tobacco: Never Used  . Alcohol Use: No   OB History    No data available     Review of Systems  Constitutional: Negative for fever and chills.  HENT: Negative for congestion and rhinorrhea.   Eyes: Negative for redness and visual disturbance.  Respiratory: Negative for shortness of breath and wheezing.   Cardiovascular: Positive for chest pain. Negative for palpitations.  Gastrointestinal: Negative for nausea and vomiting.  Genitourinary: Negative for dysuria and urgency.  Musculoskeletal: Negative for myalgias and arthralgias.  Skin: Negative for pallor and wound.  Neurological: Positive for weakness and headaches. Negative for dizziness.      Allergies  Meclizine and Prednisone  Home Medications   Prior to Admission medications   Medication Sig Start Date End Date Taking? Authorizing Provider  acetaminophen (TYLENOL) 500 MG tablet Take 1,000 mg by mouth every 6 (six) hours as needed. Reported on 08/20/2015   Yes Historical Provider, MD  allopurinol (ZYLOPRIM) 100 MG tablet Take 100 mg by mouth  every morning.    Yes Historical Provider, MD  amLODipine (NORVASC) 5 MG tablet Take 1 tablet (5 mg total) by mouth daily. 08/08/15  Yes Virgel Manifold, MD  Calcium Citrate-Vitamin D (CALCIUM CITRATE + D PO) Take 1 tablet by mouth daily.   Yes Historical Provider, MD  ferrous sulfate 325 (65 FE) MG EC tablet Take 325 mg by mouth 2 (two) times daily.   Yes Historical Provider, MD  hydrochlorothiazide  (MICROZIDE) 12.5 MG capsule Take 12.5 mg by mouth every evening.    Yes Historical Provider, MD  irbesartan-hydrochlorothiazide (AVALIDE) 150-12.5 MG per tablet Take 1 tablet by mouth daily.   Yes Historical Provider, MD  Magnesium 250 MG TABS Take 1 tablet by mouth daily.   Yes Historical Provider, MD  meclizine (ANTIVERT) 12.5 MG tablet Take 12.5 mg by mouth 3 (three) times daily as needed. 07/07/15  Yes Historical Provider, MD  metFORMIN (GLUCOPHAGE) 1000 MG tablet Take 1,000 mg by mouth 2 (two) times daily with a meal.   Yes Historical Provider, MD  Multiple Vitamins-Minerals (PRESERVISION AREDS 2 PO) Take 1 capsule by mouth 2 (two) times daily.   Yes Historical Provider, MD  nitroGLYCERIN (NITROSTAT) 0.4 MG SL tablet Place 0.4 mg under the tongue every 5 (five) minutes as needed for chest pain.   Yes Historical Provider, MD  OVER THE COUNTER MEDICATION Apply 1 application topically 2 (two) times daily as needed (For pain.). Arthritis Cream   Yes Historical Provider, MD  spironolactone (ALDACTONE) 25 MG tablet Take 25 mg by mouth daily. 07/30/15  Yes Historical Provider, MD  vitamin C (ASCORBIC ACID) 500 MG tablet Take 500 mg by mouth daily.   Yes Historical Provider, MD   BP 138/48 mmHg  Pulse 38  Temp(Src) 97.7 F (36.5 C) (Oral)  Resp 18  SpO2 100% Physical Exam  Constitutional: She is oriented to person, place, and time. She appears well-developed and well-nourished. No distress.  HENT:  Head: Normocephalic and atraumatic.  Swollen turbinates. Posterior nasal drip. Tender palpation worst about the right maxillary sinus.  Eyes: EOM are normal. Pupils are equal, round, and reactive to light.  Neck: Normal range of motion. Neck supple.  Cardiovascular: Regular rhythm.  Bradycardia present.  Exam reveals no gallop and no friction rub.   No murmur heard. Pulmonary/Chest: Effort normal. She has no wheezes. She has no rales.  Abdominal: Soft. She exhibits no distension. There is no  tenderness. There is no rebound.  Musculoskeletal: She exhibits no edema or tenderness.  Neurological: She is alert and oriented to person, place, and time.  Skin: Skin is warm and dry. She is not diaphoretic.  Psychiatric: She has a normal mood and affect. Her behavior is normal.  Nursing note and vitals reviewed.   ED Course  Procedures (including critical care time) Labs Review Labs Reviewed  CBC WITH DIFFERENTIAL/PLATELET - Abnormal; Notable for the following:    Hemoglobin 10.3 (*)    HCT 32.7 (*)    MCH 24.8 (*)    All other components within normal limits  BASIC METABOLIC PANEL - Abnormal; Notable for the following:    Glucose, Bld 175 (*)    BUN 36 (*)    Creatinine, Ser 1.29 (*)    GFR calc non Af Amer 39 (*)    GFR calc Af Amer 46 (*)    All other components within normal limits  URINALYSIS, ROUTINE W REFLEX MICROSCOPIC (NOT AT [facility]) - Abnormal; Notable for the following:    Protein,  ur 100 (*)    All other components within normal limits  BRAIN NATRIURETIC PEPTIDE - Abnormal; Notable for the following:    B Natriuretic Peptide 175.4 (*)    All other components within normal limits  URINE MICROSCOPIC-ADD ON - Abnormal; Notable for the following:    Squamous Epithelial / LPF 0-5 (*)    Bacteria, UA RARE (*)    All other components within normal limits  CBG MONITORING, ED - Abnormal; Notable for the following:    Glucose-Capillary 160 (*)    All other components within normal limits  MAGNESIUM  TSH  I-STAT TROPOININ, ED    Imaging Review Dg Chest 2 View  08/20/2015  CLINICAL DATA:  Weakness, shortness of breath, dizziness, diabetes mellitus, hypertension, pacemaker EXAM: CHEST  2 VIEW COMPARISON:  08/07/2015 FINDINGS: Enlargement of cardiac silhouette. Pulmonary vascular congestion. Mediastinal contours stable with stable prominence of the RIGHT hilum unchanged since 01/23/2007. Lungs clear. No pleural effusion or pneumothorax. Question mild osteosclerosis for  age. Multilevel endplate spur formation thoracic spine. IMPRESSION: Enlargement of cardiac silhouette with pulmonary vascular congestion. Stable mildly prominent RIGHT hilum. No acute infiltrate. Question mild diffuse osteosclerosis for age versus related to technique, could occur from metabolic bone disease and multiple other etiologies ; recommend correlation with laboratory data. Electronically Signed   By: Lavonia Dana M.D.   On: 08/20/2015 12:07   I have personally reviewed and evaluated these images and lab results as part of my medical decision-making.   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 Patient with focal sinus tenderness the patient adamantly denies that she has any sinus pain at baseline, or stuffy nose, or cough congestion. Suspected sinusitis as the cause of this pain is that the only thing found focally on exam. Chest x-ray unremarkable EKG with high-level AV block. Troponin negative BNP mildly elevated. Discussed results with patient. She is now considering having the pacemaker placed. We'll have EP see her in the ED.  HR dipping into the 30's, BP continues to be stable.  Have pacer pads in the room if needed.  CRITICAL CARE Performed by: Cecilio Asper   Total critical care time: 40 minutes  Critical care time was exclusive of separately billable procedures and treating other patients.  Critical care was necessary to treat or prevent imminent or life-threatening  deterioration.  Critical care was time spent personally by me on the following activities: development of treatment plan with patient and/or surrogate as well as nursing, discussions with consultants, evaluation of patient's response to treatment, examination of patient, obtaining history from patient or surrogate, ordering and performing treatments and interventions, ordering and review of laboratory studies, ordering and review of radiographic studies, pulse oximetry and re-evaluation of patient's condition.   Patient was seen by cardiology will admit for pacemaker placement this afternoon.  The patients results and plan were reviewed and discussed.   Any x-rays performed were independently reviewed by myself.   Differential  diagnosis were considered with the presenting HPI.  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    Deno Etienne, DO 08/20/15 [place], DO 08/20/15 [place], DO 08/20/15 [id number]

## 2015-08-20 NOTE — ED Notes (Signed)
Back from Xray.

## 2015-08-20 NOTE — ED Notes (Signed)
Pts daughter called back to ask if anyone has found the pair of earrings that she removed from her mothers ear. She reports she placed the earrings in a container on the shelf in the room but this RN informed pts daughter that no earrings have been found and this RN searched through the trash can as well and could not find any earrings.

## 2015-08-21 ENCOUNTER — Ambulatory Visit (HOSPITAL_COMMUNITY): Payer: Medicare Other

## 2015-08-21 ENCOUNTER — Encounter (HOSPITAL_COMMUNITY): Payer: Self-pay | Admitting: Internal Medicine

## 2015-08-21 DIAGNOSIS — E7800 Pure hypercholesterolemia, unspecified: Secondary | ICD-10-CM | POA: Diagnosis not present

## 2015-08-21 DIAGNOSIS — D649 Anemia, unspecified: Secondary | ICD-10-CM | POA: Diagnosis not present

## 2015-08-21 DIAGNOSIS — Z950 Presence of cardiac pacemaker: Secondary | ICD-10-CM

## 2015-08-21 DIAGNOSIS — R001 Bradycardia, unspecified: Secondary | ICD-10-CM | POA: Diagnosis not present

## 2015-08-21 DIAGNOSIS — I442 Atrioventricular block, complete: Secondary | ICD-10-CM | POA: Diagnosis not present

## 2015-08-21 LAB — GLUCOSE, CAPILLARY: Glucose-Capillary: 137 mg/dL — ABNORMAL HIGH (ref 65–99)

## 2015-08-21 MED FILL — Lidocaine HCl Local Preservative Free (PF) Inj 1%: INTRAMUSCULAR | Qty: 60 | Status: AC

## 2015-08-21 MED FILL — Heparin Sodium (Porcine) 2 Unit/ML in Sodium Chloride 0.9%: INTRAMUSCULAR | Qty: 1000 | Status: AC

## 2015-08-21 NOTE — Discharge Instructions (Signed)
° ° °  Supplemental Discharge Instructions for  Pacemaker/Defibrillator Patients  Activity No heavy lifting or vigorous activity with your left/right arm for 6 to 8 weeks.  Do not raise your left/right arm above your head for one week.  Gradually raise your affected arm as drawn below.              08/25/15                  08/26/15                 08/27/15                  08/28/15 __  NO DRIVING for  1 week   ; you may begin driving on  [id number]   .  WOUND CARE - Keep the wound area clean and dry.  Do not get this area wet for one week. No showers for one week; you may shower on 08/28/15    . - The tape/steri-strips on your wound will fall off; do not pull them off.  No bandage is needed on the site.  DO  NOT apply any creams, oils, or ointments to the wound area. - If you notice any drainage or discharge from the wound, any swelling or bruising at the site, or you develop a fever > 101? F after you are discharged home, call the office at once.  Special Instructions - You are still able to use cellular telephones; use the ear opposite the side where you have your pacemaker/defibrillator.  Avoid carrying your cellular phone near your device. - When traveling through airports, show security personnel your identification card to avoid being screened in the metal detectors.  Ask the security personnel to use the hand wand. - Avoid arc welding equipment, MRI testing (magnetic resonance imaging), TENS units (transcutaneous nerve stimulators).  Call the office for questions about other devices. - Avoid electrical appliances that are in poor condition or are not properly grounded. - Microwave ovens are safe to be near or to operate.  Additional information for defibrillator patients should your device go off: - If your device goes off ONCE and you feel fine afterward, notify the device clinic nurses. - If your device goes off ONCE and you do not feel well afterward, call 911. - If your device  goes off TWICE, call 911. - If your device goes off THREE times in one day, call 911.  DO NOT DRIVE YOURSELF OR A FAMILY MEMBER WITH A DEFIBRILLATOR TO THE HOSPITAL--CALL 911.

## 2015-08-21 NOTE — Discharge Summary (Signed)
ELECTROPHYSIOLOGY PROCEDURE DISCHARGE SUMMARY    Patient ID: Krista Clarke,  MRN: [id number], DOB/AGE: [birth year] 75 y.o.  Admit date: 08/20/2015 Discharge date: 08/21/2015  Primary Care Physician: Jilda Panda, MD Primary Cardiologist: Dr. Einar Gip Electrophysiologist: Dr. Curt Bears  Primary Discharge Diagnosis:  Symptomatic bradycardia status post pacemaker implantation this admission  Secondary Discharge Diagnosis:  1. HTN 2. DM 3. CRI  Allergies  Allergen Reactions  . Meclizine Nausea And Vomiting    Made her feel more dizzy  . Prednisone      Procedures This Admission:  1.  Implantation of a STJ dual chamber PPM on 08/20/15 by Dr Lovena Le.  The patient received a St. Jude (serial number [id number]) pacemaker with St. Jude 2088 (serial number [id number]) right atrial lead and a St. Jude 2088 (serial number [id number] right ventricular lead. There were no immediate post procedure complications. 2.  CXR on demonstrated no pneumothorax status post device implantation.   Brief HPI: Krista Clarke is a 74 y.o. female was referred to electrophysiology in the outpatient setting for evaluation bradycardia, heart block.  She was recommended PPM implantation though wanted to hold off until after the holidays. She arrived to [facility] ER though with symptomatic bradycardia with 2:1 heart block with intermittent CHB and underwent PPM implant yesterday.  Past medical history includes DM,HTN, CRI.  The patient has had symptomatic bradycardia without reversible causes identified, her home metoprolol had been stopped 2 weeks ago by her PMD.  Risks, benefits, and alternatives to PPM implantation were reviewed with the patient who wished to proceed.   Hospital Course:  The patient was admitted and underwent implantation of a STJ PPM with details as outlined above.  She  was monitored on telemetry overnight which demonstrated SR with V pacing, infrequent AV pacing.  Left chest was without hematoma or ecchymosis.   The device was interrogated and found to be functioning normally.  CXR was obtained and demonstrated no pneumothorax status post device implantation.  The patient's homew medicine lists antivert/also listed as an allergy though the patient and daughter states she uses it PRN without reaction.  Wound care, arm mobility, and restrictions were reviewed with the patient.  The patient was examined by Dr. Lovena Le and considered stable for discharge to home.    Physical Exam: Filed Vitals:   08/20/15 1818 08/20/15 1916 08/20/15 1930 08/21/15 0402  BP: 149/69 208/57 152/60 115/22  Pulse: 103 71  74  Temp:  98.7 F (37.1 C)  97.7 F (36.5 C)  TempSrc:  Oral  Oral  Resp: 14 12 20 19   Weight:    220 lb 7.4 oz (100 kg)  SpO2: 100% 100% 100% 93%    GEN- The patient is well appearing, alert and oriented x 3 today.   HEENT: normocephalic, atraumatic; sclera clear, conjunctiva pink; hearing intact; oropharynx clear; neck supple, no JVP Lungs- Clear to ausculation bilaterally, normal work of breathing.  No wheezes, rales, rhonchi Heart- Regular rate and rhythm, no murmurs, rubs or gallops, PMI not laterally displaced GI- soft, non-tender, non-distended Extremities- no clubbing, cyanosis, or edema MS- no significant deformity or atrophy Skin- warm and dry, no rash or lesion, left chest without hematoma/ecchymosis Psych- euthymic mood, full affect Neuro- no gross deficits   Labs:   Lab Results  Component Value Date   WBC 5.3 08/20/2015   HGB 10.3* 08/20/2015   HCT 32.7* 08/20/2015   MCV 78.6 08/20/2015   PLT 257 08/20/2015     Recent Labs Lab 08/20/15  1155  NA 138  K 5.0  CL 103  CO2 26  BUN 36*  CREATININE 1.29*  CALCIUM 10.0  GLUCOSE 175*   08/21/15:  CXR IMPRESSION: There is no postprocedure complication following permanent pacemaker placement. There is no active cardiopulmonary disease.  Discharge Medications:    Medication List    TAKE these medications         acetaminophen 500 MG tablet  Commonly known as:  TYLENOL  Take 1,000 mg by mouth every 6 (six) hours as needed. Reported on 08/20/2015     allopurinol 100 MG tablet  Commonly known as:  ZYLOPRIM  Take 100 mg by mouth every morning.     amLODipine 5 MG tablet  Commonly known as:  NORVASC  Take 1 tablet (5 mg total) by mouth daily.     CALCIUM CITRATE + D PO  Take 1 tablet by mouth daily.     ferrous sulfate 325 (65 FE) MG EC tablet  Take 325 mg by mouth 2 (two) times daily.     hydrochlorothiazide 12.5 MG capsule  Commonly known as:  MICROZIDE  Take 12.5 mg by mouth every evening.     irbesartan-hydrochlorothiazide 150-12.5 MG tablet  Commonly known as:  AVALIDE  Take 1 tablet by mouth daily.     Magnesium 250 MG Tabs  Take 1 tablet by mouth daily.     meclizine 12.5 MG tablet  Commonly known as:  ANTIVERT  Take 12.5 mg by mouth 3 (three) times daily as needed.     metFORMIN 1000 MG tablet  Commonly known as:  GLUCOPHAGE  Take 1,000 mg by mouth 2 (two) times daily with a meal.     nitroGLYCERIN 0.4 MG SL tablet  Commonly known as:  NITROSTAT  Place 0.4 mg under the tongue every 5 (five) minutes as needed for chest pain.     OVER THE COUNTER MEDICATION  Apply 1 application topically 2 (two) times daily as needed (For pain.). Arthritis Cream     PRESERVISION AREDS 2 PO  Take 1 capsule by mouth 2 (two) times daily.     spironolactone 25 MG tablet  Commonly known as:  ALDACTONE  Take 25 mg by mouth daily.     vitamin C 500 MG tablet  Commonly known as:  ASCORBIC ACID  Take 500 mg by mouth daily.        Disposition: Home Discharge Instructions    Diet - low sodium heart healthy    Complete by:  As directed      Increase activity slowly    Complete by:  As directed           Follow-up Information    Follow up with [facility] On 08/04/2015.   Specialty:  Cardiology   Why:  3:00PM wound check   Contact information:   [address] [place] [phone]      Follow up with Will Meredith Leeds, MD On 11/06/2015.   Specialty:  Cardiology   Why:  2:30PM   Contact information:   [place] [address] [phone]       Duration of Discharge Encounter: Greater than 30 minutes including physician time.  Venetia Night, PA-C 08/21/2015 8:29 AM    EP Attending  Patient seen and examined. Agree with above. [place] for discharge. Usual followup. She will see Dr. Loman Chroman.D.

## 2015-08-22 ENCOUNTER — Ambulatory Visit (HOSPITAL_COMMUNITY): Admission: RE | Admit: 2015-08-22 | Payer: Medicare Other | Source: Ambulatory Visit

## 2015-08-26 NOTE — Addendum Note (Signed)
Addended by: Freada Bergeron on: 08/26/2015 05:11 PM   Modules accepted: Orders

## 2015-08-29 ENCOUNTER — Other Ambulatory Visit: Payer: Medicare Other

## 2015-09-04 ENCOUNTER — Encounter: Payer: Self-pay | Admitting: Cardiology

## 2015-09-04 ENCOUNTER — Ambulatory Visit (INDEPENDENT_AMBULATORY_CARE_PROVIDER_SITE_OTHER): Payer: Medicare Other | Admitting: *Deleted

## 2015-09-04 DIAGNOSIS — I441 Atrioventricular block, second degree: Secondary | ICD-10-CM

## 2015-09-04 LAB — CUP PACEART INCLINIC DEVICE CHECK
Battery Voltage: 3.07 V
Date Time Interrogation Session: 20170105173542
Implantable Lead Location: 753860
Implantable Lead Location: 753860
Lead Channel Impedance Value: 650 Ohm
Lead Channel Pacing Threshold Amplitude: 0.5 V
Lead Channel Pacing Threshold Amplitude: 0.5 V
Lead Channel Pacing Threshold Pulse Width: 0.5 ms
Lead Channel Pacing Threshold Pulse Width: 0.5 ms
Lead Channel Setting Pacing Amplitude: 1.5 V
Lead Channel Setting Sensing Sensitivity: 4 mV
MDC IDC LEAD IMPLANT DT: 20161221
MDC IDC LEAD IMPLANT DT: 20161221
MDC IDC MSMT BATTERY REMAINING LONGEVITY: 133.2
MDC IDC MSMT LEADCHNL RA IMPEDANCE VALUE: 462.5 Ohm
MDC IDC MSMT LEADCHNL RA SENSING INTR AMPL: 3 mV
MDC IDC SET LEADCHNL RV PACING AMPLITUDE: 0.75 V
MDC IDC SET LEADCHNL RV PACING PULSEWIDTH: 0.5 ms
MDC IDC STAT BRADY RA PERCENT PACED: 1.8 %
MDC IDC STAT BRADY RV PERCENT PACED: 99.86 %
Pulse Gen Serial Number: 7839181

## 2015-09-04 NOTE — Progress Notes (Signed)
Wound check appointment. Steri-strips removed. Wound without redness or edema. Incision edges approximated, wound well healed. Slight edema noted over wound, soft to palpation, no redness, warmth, drainage, or tenderness to indicate infection or hematoma. Patient and daughter aware to call with worsening symptoms or signs of infection. Normal device function. Thresholds, sensing, and impedances consistent with implant measurements. Device auto capture programmed on for extra safety margin until 3 month visit. Histogram distribution appropriate for patient and level of activity. 4 AMS episodes (<1%), EGMs suggest AT, longest ~97min, peak A 163bpm, peak V 78bpm. No high ventricular rates noted. Patient educated about wound care, arm mobility, lifting restrictions. ROV with WC on 11/06/15.

## 2015-09-12 ENCOUNTER — Encounter: Payer: Self-pay | Admitting: Cardiology

## 2015-09-15 ENCOUNTER — Ambulatory Visit: Payer: Medicare Other

## 2015-09-16 ENCOUNTER — Encounter (HOSPITAL_COMMUNITY): Payer: Self-pay | Admitting: Emergency Medicine

## 2015-09-16 ENCOUNTER — Emergency Department (HOSPITAL_COMMUNITY)
Admission: EM | Admit: 2015-09-16 | Discharge: 2015-09-17 | Disposition: A | Discharge status: Home / Self Care | Payer: Medicare Other | Attending: Emergency Medicine | Admitting: Emergency Medicine

## 2015-09-16 ENCOUNTER — Other Ambulatory Visit: Payer: Self-pay

## 2015-09-16 ENCOUNTER — Emergency Department (HOSPITAL_COMMUNITY): Payer: Medicare Other

## 2015-09-16 DIAGNOSIS — I10 Essential (primary) hypertension: Secondary | ICD-10-CM | POA: Insufficient documentation

## 2015-09-16 DIAGNOSIS — Z8739 Personal history of other diseases of the musculoskeletal system and connective tissue: Secondary | ICD-10-CM | POA: Diagnosis not present

## 2015-09-16 DIAGNOSIS — Z79899 Other long term (current) drug therapy: Secondary | ICD-10-CM | POA: Diagnosis not present

## 2015-09-16 DIAGNOSIS — D649 Anemia, unspecified: Secondary | ICD-10-CM | POA: Diagnosis not present

## 2015-09-16 DIAGNOSIS — E119 Type 2 diabetes mellitus without complications: Secondary | ICD-10-CM | POA: Insufficient documentation

## 2015-09-16 DIAGNOSIS — Z9889 Other specified postprocedural states: Secondary | ICD-10-CM | POA: Diagnosis not present

## 2015-09-16 DIAGNOSIS — R42 Dizziness and giddiness: Secondary | ICD-10-CM | POA: Insufficient documentation

## 2015-09-16 DIAGNOSIS — R0602 Shortness of breath: Secondary | ICD-10-CM | POA: Insufficient documentation

## 2015-09-16 DIAGNOSIS — Z7984 Long term (current) use of oral hypoglycemic drugs: Secondary | ICD-10-CM | POA: Insufficient documentation

## 2015-09-16 DIAGNOSIS — R079 Chest pain, unspecified: Secondary | ICD-10-CM | POA: Diagnosis not present

## 2015-09-16 LAB — BASIC METABOLIC PANEL
ANION GAP: 8 (ref 5–15)
BUN: 22 mg/dL — ABNORMAL HIGH (ref 6–20)
CHLORIDE: 103 mmol/L (ref 101–111)
CO2: 26 mmol/L (ref 22–32)
Calcium: 9.8 mg/dL (ref 8.9–10.3)
Creatinine, Ser: 1.26 mg/dL — ABNORMAL HIGH (ref 0.44–1.00)
GFR, EST AFRICAN AMERICAN: 47 mL/min — AB (ref >60)
GFR, EST NON AFRICAN AMERICAN: 40 mL/min — AB (ref >60)
Glucose, Bld: 176 mg/dL — ABNORMAL HIGH (ref 65–99)
POTASSIUM: 5 mmol/L (ref 3.5–5.1)
SODIUM: 137 mmol/L (ref 135–145)

## 2015-09-16 LAB — CBC
HEMATOCRIT: 34.2 % — AB (ref 36.0–46.0)
Hemoglobin: 10.4 g/dL — ABNORMAL LOW (ref 12.0–15.0)
MCH: 24.1 pg — ABNORMAL LOW (ref 26.0–34.0)
MCHC: 30.4 g/dL (ref 30.0–36.0)
MCV: 79.2 fL (ref 78.0–100.0)
PLATELETS: 278 10*3/uL (ref 150–400)
RBC: 4.32 MIL/uL (ref 3.87–5.11)
RDW: 14.3 % (ref 11.5–15.5)
WBC: 6.4 10*3/uL (ref 4.0–10.5)

## 2015-09-16 LAB — I-STAT TROPONIN, ED: Troponin i, poc: 0 ng/mL (ref 0.00–0.08)

## 2015-09-16 NOTE — ED Provider Notes (Signed)
CSN: [id number]     Arrival date & time 09/16/15  1804 History  By signing my name below, I, Soijett Blue, attest that this documentation has been prepared under the direction and in the presence of Orpah Greek, MD. Electronically Signed: Soijett Blue, ED Scribe. 09/16/2015. 11:13 PM.   Chief Complaint  Patient presents with  . Chest Pain      The history is provided by the patient. No language interpreter was used.    HPI Comments: Krista Clarke is a 75 y.o. female with a medical hx of HTN, DM, hypercholesteremia who presents to the Emergency Department complaining of left upper CP onset 2 PM lasting for 3 hours and has since resolved. She reports that she was at rest when her CP began. She states that she is having associated symptoms of mild SOB and mild dizziness. She notes that all of her associated symptoms have resolved at this time. She states that she has tried 2 NTG at home with relief for her symptoms. She notes that her NTG was Rx to her by her cardiologist Dr. Einar Gip. She denies vomiting, nausea, any other symptoms. She notes that she just had a pacemaker placed on 08/20/2015 and she is unsure if she was supposed to have CP following the procedure. Pt also had a left heart catherization on 08/27/2014.   Past Medical History  Diagnosis Date  . Hypertension   . Diabetes mellitus without complication ([place])   . Hypercholesteremia   . Arthritis   . Anemia     takes iron daily   Past Surgical History  Procedure Laterality Date  . Tubal ligation    . Joint replacement Bilateral     '99  . Abdominal hysterectomy    . Colonoscopy with propofol N/A 07/03/2013    Procedure: COLONOSCOPY WITH PROPOFOL;  Surgeon: Garlan Fair, MD;  Location: [facility];  Service: Endoscopy;  Laterality: N/A;  . Left heart catheterization with coronary angiogram N/A 08/27/2014    Procedure: LEFT HEART CATHETERIZATION WITH CORONARY ANGIOGRAM;  Surgeon: Laverda Page, MD;  Location: [facility]  CATH LAB;  Service: Cardiovascular;  Laterality: N/A;  . Ep implantable device N/A 08/20/2015    Procedure: Pacemaker Implant;  Surgeon: Evans Lance, MD;  Location: [place] [facility] LAB;  Service: Cardiovascular;  Laterality: N/A;   Family History  Problem Relation Age of Onset  . Hypertension Father   . Hypertension Mother   . Diabetes Mother   . Cancer Brother    Social History  Substance Use Topics  . Smoking status: Never Smoker   . Smokeless tobacco: Never Used  . Alcohol Use: No   OB History    No data available     Review of Systems  Respiratory: Positive for shortness of breath.   Cardiovascular: Positive for chest pain.  Gastrointestinal: Negative for nausea and vomiting.  Neurological: Positive for dizziness.  All other systems reviewed and are negative.     Allergies  Prednisone  Home Medications   Prior to Admission medications   Medication Sig Start Date End Date Taking? Authorizing Provider  acetaminophen (TYLENOL) 500 MG tablet Take 1,000 mg by mouth every 6 (six) hours as needed. Reported on 08/20/2015    Historical Provider, MD  allopurinol (ZYLOPRIM) 100 MG tablet Take 100 mg by mouth every morning.     Historical Provider, MD  amLODipine (NORVASC) 5 MG tablet Take 1 tablet (5 mg total) by mouth daily. 08/08/15   Virgel Manifold, MD  Calcium Citrate-Vitamin D (CALCIUM CITRATE + D PO) Take 1 tablet by mouth daily.    Historical Provider, MD  ferrous sulfate 325 (65 FE) MG EC tablet Take 325 mg by mouth 2 (two) times daily.    Historical Provider, MD  hydrochlorothiazide (MICROZIDE) 12.5 MG capsule Take 12.5 mg by mouth every evening.     Historical Provider, MD  irbesartan-hydrochlorothiazide (AVALIDE) 150-12.5 MG per tablet Take 1 tablet by mouth daily.    Historical Provider, MD  Magnesium 250 MG TABS Take 1 tablet by mouth daily.    Historical Provider, MD  meclizine (ANTIVERT) 12.5 MG tablet Take 12.5 mg by mouth 3 (three) times daily as needed.  07/07/15   Historical Provider, MD  metFORMIN (GLUCOPHAGE) 1000 MG tablet Take 1,000 mg by mouth 2 (two) times daily with a meal.    Historical Provider, MD  Multiple Vitamins-Minerals (PRESERVISION AREDS 2 PO) Take 1 capsule by mouth 2 (two) times daily.    Historical Provider, MD  nitroGLYCERIN (NITROSTAT) 0.4 MG SL tablet Place 0.4 mg under the tongue every 5 (five) minutes as needed for chest pain.    Historical Provider, MD  OVER THE COUNTER MEDICATION Apply 1 application topically 2 (two) times daily as needed (For pain.). Arthritis Cream    Historical Provider, MD  spironolactone (ALDACTONE) 25 MG tablet Take 25 mg by mouth daily. 07/30/15   Historical Provider, MD  vitamin C (ASCORBIC ACID) 500 MG tablet Take 500 mg by mouth daily.    Historical Provider, MD   BP 154/59 mmHg  Pulse 77  Temp(Src) 98.8 F (37.1 C) (Oral)  Resp 13  SpO2 100% Physical Exam  Constitutional: She is oriented to person, place, and time. She appears well-developed and well-nourished. No distress.  HENT:  Head: Normocephalic and atraumatic.  Right Ear: Hearing normal.  Left Ear: Hearing normal.  Nose: Nose normal.  Mouth/Throat: Oropharynx is clear and moist and mucous membranes are normal.  Eyes: Conjunctivae and EOM are normal. Pupils are equal, round, and reactive to light.  Neck: Normal range of motion. Neck supple.  Cardiovascular: Normal rate, regular rhythm, S1 normal, S2 normal and normal heart sounds.  Exam reveals no gallop and no friction rub.   No murmur heard. Pulmonary/Chest: Effort normal and breath sounds normal. No respiratory distress. She exhibits no tenderness.  Abdominal: Soft. Normal appearance and bowel sounds are normal. There is no hepatosplenomegaly. There is no tenderness. There is no rebound, no guarding, no tenderness at McBurney's point and negative Murphy's sign. No hernia.  Musculoskeletal: Normal range of motion.  Neurological: She is alert and oriented to person, place, and  time. She has normal strength. No cranial nerve deficit or sensory deficit. Coordination normal. GCS eye subscore is 4. GCS verbal subscore is 5. GCS motor subscore is 6.  Skin: Skin is warm, dry and intact. No rash noted. No cyanosis.  Psychiatric: She has a normal mood and affect. Her speech is normal and behavior is normal. Thought content normal.  Nursing note and vitals reviewed.   ED Course  Procedures (including critical care time) DIAGNOSTIC STUDIES: Oxygen Saturation is 100% on RA, nl by my interpretation.    COORDINATION OF CARE: 11:12 PM Discussed treatment plan with pt at bedside which includes labs, CXR, and EKG and pt agreed to plan.    Labs Review Labs Reviewed  BASIC METABOLIC PANEL - Abnormal; Notable for the following:    Glucose, Bld 176 (*)    BUN 22 (*)  Creatinine, Ser 1.26 (*)    GFR calc non Af Amer 40 (*)    GFR calc Af Amer 47 (*)    All other components within normal limits  CBC - Abnormal; Notable for the following:    Hemoglobin 10.4 (*)    HCT 34.2 (*)    MCH 24.1 (*)    All other components within normal limits  I-STAT TROPOININ, ED  Randolm Idol, ED    Imaging Review Dg Chest 2 View  09/16/2015  CLINICAL DATA:  Acute onset of left chest pain. Shortness of breath. Initial encounter. EXAM: CHEST  2 VIEW COMPARISON:  Chest radiograph performed 08/21/2015 FINDINGS: The lungs are well-aerated and clear. There is no evidence of focal opacification, pleural effusion or pneumothorax. The heart is normal in size; a pacemaker is noted at the left chest wall, with leads ending at the right atrium and right ventricle. No acute osseous abnormalities are seen. Mild anterior bridging osteophytes noted along the mid to lower thoracic spine. IMPRESSION: No acute cardiopulmonary process seen. Electronically Signed   By: Garald Balding M.D.   On: 09/16/2015 18:40   I have personally reviewed and evaluated these images and lab results as part of my medical  decision-making.   EKG Interpretation   Date/Time:  Tuesday September 16 2015 22:33:44 EST Ventricular Rate:  84 PR Interval:  177 QRS Duration: 155 QT Interval:  411 QTC Calculation: 486 R Axis:   -70 Text Interpretation:  Atrial-sensed ventricular-paced rhythm Reconfirmed  by Zamani Crocker  MD, Jenita Rayfield [phone]) on 09/17/2015 12:31:35 AM      MDM   Final diagnoses:  None  chest pain  Patient presents to the emergency department for evaluation of chest pain. Patient has atypical chest pain. She had pain develop at rest that did not respond to nitroglycerin. Pain lasted for several hours and then spontaneously resolved. She has been pain-free for a number of hours since the resolution. Initial troponin was negative. Repeat troponin also negative. Reviewing her records reveals that she had cardiac catheterization last month that did not show any blockages. This is very reassuring. Pain appears to be noncardiac in nature. She is appropriate for outpatient follow-up with Dr. Einar Gip.  I personally performed the services described in this documentation, which was scribed in my presence. The recorded information has been reviewed and is accurate.     Orpah Greek, MD 09/17/15 [phone]

## 2015-09-16 NOTE — ED Notes (Signed)
Pt sts left sided CP with SOB starting 2 hours ago improved with nitro

## 2015-09-16 NOTE — ED Provider Notes (Signed)
MSE was initiated and I personally evaluated the patient and placed orders (if any) at  11:06 PM on September 16, 2015.  The patient appears stable so that the remainder of the MSE may be completed by another provider.  Patient is a 75 year old female who presents to the emergency room with left-sided chest pain and shortness of breath that began at rest at approximately 1 PM today. The pain is described as burning and "hurting," she took one nitroglycerin shortly after it began with worsening shortness of breath and lightheadedness. She states that she had increasing pain when she got up to get the medicine and put under her tongue.  She took a second nitroglycerin.  Triage approximately 5 hours later she reported improvement of her chest pain.  She currently denies any chest pain.  Her left anterior chest wall is tender to palpation.  Vital signs are stable patient is alert and oriented.  Dr. Colin Mulders had requested a repeat EKG when the patient was roomed.  Patient was placed on monitor delta troponin was ordered.    Remaining workup may be completed by arriving night EDP.    Delsa Grana, PA-C 09/21/15 [id number]  Sherwood Gambler, MD 09/23/15 [phone]

## 2015-09-16 NOTE — ED Notes (Signed)
Dr. Betsey Holiday at bedside at this time.

## 2015-09-16 NOTE — ED Notes (Signed)
Phlebotomy at bedside at this time.

## 2015-09-17 LAB — I-STAT TROPONIN, ED: Troponin i, poc: 0.01 ng/mL (ref 0.00–0.08)

## 2015-09-17 NOTE — Discharge Instructions (Signed)

## 2015-09-17 NOTE — ED Notes (Signed)
Dr. Betsey Holiday at bedside at this time.

## 2015-11-06 ENCOUNTER — Encounter: Payer: Medicare Other | Admitting: Cardiology

## 2015-11-17 NOTE — Progress Notes (Signed)
Electrophysiology Office Note   Date:  11/18/2015   ID:  Krista Clarke, DOB 03/14/1941, MRN [id number]  PCP:  Jilda Panda, MD Primary Electrophysiologist:  Constance Haw, MD    Chief Complaint  Patient presents with  . 90 days post implant     History of Present Illness: Krista Clarke is a 75 y.o. female who presents today for electrophysiology evaluation.    She was referred for evaluation bradycardia, heart block. She was recommended PPM implantation though wanted to hold off until after the holidays. She arrived to [facility] ER though with symptomatic bradycardia with 2:1 heart block with intermittent CHB and underwent PPM implant yesterday. Past medical history includes DM,HTN, CRI.   Today, she denies symptoms of palpitations, chest pain, shortness of breath, orthopnea, PND, lower extremity edema, claudication, dizziness, presyncope, syncope, bleeding, or neurologic sequela. The patient is tolerating medications without difficulties and is otherwise without complaint today.    Past Medical History  Diagnosis Date  . Hypertension   . Diabetes mellitus without complication ([place])   . Hypercholesteremia   . Arthritis   . Anemia     takes iron daily   Past Surgical History  Procedure Laterality Date  . Tubal ligation    . Joint replacement Bilateral     '99  . Abdominal hysterectomy    . Colonoscopy with propofol N/A 07/03/2013    Procedure: COLONOSCOPY WITH PROPOFOL;  Surgeon: Garlan Fair, MD;  Location: [facility];  Service: Endoscopy;  Laterality: N/A;  . Left heart catheterization with coronary angiogram N/A 08/27/2014    Procedure: LEFT HEART CATHETERIZATION WITH CORONARY ANGIOGRAM;  Surgeon: Laverda Page, MD;  Location: [facility] CATH LAB;  Service: Cardiovascular;  Laterality: N/A;  . Ep implantable device N/A 08/20/2015    Procedure: Pacemaker Implant;  Surgeon: Evans Lance, MD;  Location: [facility] LAB;  Service: Cardiovascular;  Laterality: N/A;      Current Outpatient Prescriptions  Medication Sig Dispense Refill  . acetaminophen (TYLENOL) 500 MG tablet Take 1,000 mg by mouth every 6 (six) hours as needed. Reported on 08/20/2015    . allopurinol (ZYLOPRIM) 100 MG tablet Take 100 mg by mouth every morning.     Marland Kitchen amLODipine (NORVASC) 5 MG tablet Take 1 tablet (5 mg total) by mouth daily. 30 tablet 0  . Calcium Citrate-Vitamin D (CALCIUM CITRATE + D PO) Take 1 tablet by mouth daily.    . ferrous sulfate 325 (65 FE) MG EC tablet Take 325 mg by mouth 2 (two) times daily.    . hydrochlorothiazide (MICROZIDE) 12.5 MG capsule Take 12.5 mg by mouth every evening.     . irbesartan-hydrochlorothiazide (AVALIDE) 150-12.5 MG per tablet Take 1 tablet by mouth daily.    . Magnesium 250 MG TABS Take 1 tablet by mouth daily.    . meclizine (ANTIVERT) 12.5 MG tablet Take 12.5 mg by mouth 3 (three) times daily as needed.    . metFORMIN (GLUCOPHAGE) 1000 MG tablet Take 1,000 mg by mouth 2 (two) times daily with a meal.    . Multiple Vitamins-Minerals (PRESERVISION AREDS 2 PO) Take 1 capsule by mouth 2 (two) times daily.    . nitroGLYCERIN (NITROSTAT) 0.4 MG SL tablet Place 0.4 mg under the tongue every 5 (five) minutes as needed for chest pain.    Marland Kitchen OVER THE COUNTER MEDICATION Apply 1 application topically 2 (two) times daily as needed (For pain.). Arthritis Cream    . SitaGLIPtin-MetFORMIN HCl (JANUMET XR) [phone]  MG TB24 Take by mouth. Take as directed    . spironolactone (ALDACTONE) 25 MG tablet Take 25 mg by mouth daily.    . vitamin C (ASCORBIC ACID) 500 MG tablet Take 500 mg by mouth daily.     No current facility-administered medications for this visit.    Allergies:   Prednisone   Social History:  The patient  reports that she has never smoked. She has never used smokeless tobacco. She reports that she does not drink alcohol or use illicit drugs.   Family History:  The patient's family history includes Cancer in her brother; Diabetes in  her mother; Hypertension in her father and mother.    ROS:  Please see the history of present illness.   Otherwise, review of systems is positive for none.   All other systems are reviewed and negative.    PHYSICAL EXAM: VS:  BP 140/82 mmHg  Pulse 98  Ht 5\' 4"  (1.626 m)  Wt 217 lb 3.2 oz (98.521 kg)  BMI 37.26 kg/m2 , BMI Body mass index is 37.26 kg/(m^2). GEN: Well nourished, well developed, in no acute distress HEENT: normal Neck: no JVD, carotid bruits, or masses Cardiac: RRR; no murmurs, rubs, or gallops,no edema  Respiratory:  clear to auscultation bilaterally, normal work of breathing GI: soft, nontender, nondistended, + BS MS: no deformity or atrophy Skin: warm and dry,  device pocket is well healed Neuro:  Strength and sensation are intact Psych: euthymic mood, full affect  EKG:  EKG is ordered today. The ekg ordered today shows sinus rhythm, V paced  Device interrogation is reviewed today in detail.  See PaceArt for details.   Recent Labs: 08/07/2015: ALT 20 08/20/2015: B Natriuretic Peptide 175.4*; Magnesium 1.8; TSH 3.020 09/16/2015: BUN 22*; Creatinine, Ser 1.26*; Hemoglobin 10.4*; Platelets 278; Potassium 5.0; Sodium 137    Lipid Panel     Component Value Date/Time   CHOL 175 08/27/2014 0352   TRIG 146 08/27/2014 0352   HDL 52 08/27/2014 0352   CHOLHDL 3.4 08/27/2014 0352   VLDL 29 08/27/2014 0352   LDLCALC 94 08/27/2014 0352     Wt Readings from Last 3 Encounters:  11/18/15 217 lb 3.2 oz (98.521 kg)  08/21/15 220 lb 7.4 oz (100 kg)  08/19/15 218 lb 12.8 oz (99.247 kg)      Other studies Reviewed: Additional studies/ records that were reviewed today include: TE 12/11/14  Review of the above records today demonstrates:  EF 63%   ASSESSMENT AND PLAN:  1.  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  Functioning appropriately without issues currently.  No changes made in clinic today.  I have told her that she  can go back to her exercise routine.    Current medicines are reviewed at length with the patient today.   The patient does not have concerns regarding her medicines.  The following changes were made today:  none  Labs/ tests ordered today include:  No orders of the defined types were placed in this encounter.     Disposition:   FU with Larenda Reedy 9 months  Signed, Reyaan Thoma Meredith Leeds, MD  11/18/2015 2:50 PM     [address] [place] [address] [phone] (office) [phone] (fax)

## 2015-11-18 ENCOUNTER — Encounter: Payer: Self-pay | Admitting: Cardiology

## 2015-11-18 ENCOUNTER — Ambulatory Visit (INDEPENDENT_AMBULATORY_CARE_PROVIDER_SITE_OTHER): Payer: Medicare Other | Admitting: Cardiology

## 2015-11-18 VITALS — BP 140/82 | HR 98 | Ht 64.0 in | Wt 217.2 lb

## 2015-11-18 DIAGNOSIS — I442 Atrioventricular block, complete: Secondary | ICD-10-CM | POA: Diagnosis not present

## 2015-11-18 NOTE — Patient Instructions (Addendum)
Medication Instructions:  Your physician recommends that you continue on your current medications as directed. Please refer to the Current Medication list given to you today.  Labwork: None ordered  Testing/Procedures: None ordered  Follow-Up: Remote monitoring is used to monitor your Pacemaker of ICD from home. This monitoring reduces the number of office visits required to check your device to one time per year. It allows [place] to keep an eye on the functioning of your device to ensure it is working properly. You are scheduled for a device check from home on 02/17/2016. You may send your transmission at any time that day. If you have a wireless device, the transmission will be sent automatically. After your physician reviews your transmission, you will receive a postcard with your next transmission date.  Your physician wants you to follow-up in: 9 months with Dr. Curt Bears. You will receive a reminder letter in the mail two months in advance. If you don't receive a letter, please call our office to schedule the follow-up appointment.   Any Other Special Instructions Will Be Listed Below (If Applicable).  If you need a refill on your cardiac medications before your next appointment, please call your pharmacy.  Thank you for choosing [facility]!!   Trinidad Curet, RN [phone]  Low-Sodium Eating Plan Sodium raises blood pressure and causes water to be held in the body. Getting less sodium from food will help lower your blood pressure, reduce any swelling, and protect your heart, liver, and kidneys. We get sodium by adding salt (sodium chloride) to food. Most of our sodium comes from canned, boxed, and frozen foods. Restaurant foods, fast foods, and pizza are also very high in sodium. Even if you take medicine to lower your blood pressure or to reduce fluid in your body, getting less sodium from your food is important. WHAT IS MY PLAN? Most people should limit their sodium intake to  2,300 mg a day. Your health care provider recommends that you limit your sodium intake to __________ a day.  WHAT DO I NEED TO KNOW ABOUT THIS EATING PLAN? For the low-sodium eating plan, you will follow these general guidelines:  Choose foods with a % Daily Value for sodium of less than 5% (as listed on the food label).   Use salt-free seasonings or herbs instead of table salt or sea salt.   Check with your health care provider or pharmacist before using salt substitutes.   Eat fresh foods.  Eat more vegetables and fruits.  Limit canned vegetables. If you do use them, rinse them well to decrease the sodium.   Limit cheese to 1 oz (28 g) per day.   Eat lower-sodium products, often labeled as "lower sodium" or "no salt added."  Avoid foods that contain monosodium glutamate (MSG). MSG is sometimes added to [place] food and some canned foods.  Check food labels (Nutrition Facts labels) on foods to learn how much sodium is in one serving.  Eat more home-cooked food and less restaurant, buffet, and fast food.  When eating at a restaurant, ask that your food be prepared with less salt, or no salt if possible.  HOW DO I READ FOOD LABELS FOR SODIUM INFORMATION? The Nutrition Facts label lists the amount of sodium in one serving of the food. If you eat more than one serving, you must multiply the listed amount of sodium by the number of servings. Food labels may also identify foods as:  Sodium free--Less than 5 mg in a serving.  Very low sodium--35 mg or less in a serving.  Low sodium--140 mg or less in a serving.  Light in sodium--50% less sodium in a serving. For example, if a food that usually has 300 mg of sodium is changed to become light in sodium, it will have 150 mg of sodium.  Reduced sodium--25% less sodium in a serving. For example, if a food that usually has 400 mg of sodium is changed to reduced sodium, it will have 300 mg of sodium. WHAT FOODS CAN I  EAT? Grains Low-sodium cereals, including oats, puffed wheat and rice, and shredded wheat cereals. Low-sodium crackers. Unsalted rice and pasta. Lower-sodium bread.  Vegetables Frozen or fresh vegetables. Low-sodium or reduced-sodium canned vegetables. Low-sodium or reduced-sodium tomato sauce and paste. Low-sodium or reduced-sodium tomato and vegetable juices.  Fruits Fresh, frozen, and canned fruit. Fruit juice.  Meat and Other Protein Products Low-sodium canned tuna and salmon. Fresh or frozen meat, poultry, seafood, and fish. Lamb. Unsalted nuts. Dried beans, peas, and lentils without added salt. Unsalted canned beans. Homemade soups without salt. Eggs.  Dairy Milk. Soy milk. Ricotta cheese. Low-sodium or reduced-sodium cheeses. Yogurt.  Condiments Fresh and dried herbs and spices. Salt-free seasonings. Onion and garlic powders. Low-sodium varieties of mustard and ketchup. Fresh or refrigerated horseradish. Lemon juice.  Fats and Oils Reduced-sodium salad dressings. Unsalted butter.  Other Unsalted popcorn and pretzels.  The items listed above may not be a complete list of recommended foods or beverages. Contact your dietitian for more options. WHAT FOODS ARE NOT RECOMMENDED? Grains Instant hot cereals. Bread stuffing, pancake, and biscuit mixes. Croutons. Seasoned rice or pasta mixes. Noodle soup cups. Boxed or frozen macaroni and cheese. Self-rising flour. Regular salted crackers. Vegetables Regular canned vegetables. Regular canned tomato sauce and paste. Regular tomato and vegetable juices. Frozen vegetables in sauces. Salted [place] fries. Olives. Angie Fava. Relishes. Sauerkraut. Salsa. Meat and Other Protein Products Salted, canned, smoked, spiced, or pickled meats, seafood, or fish. Bacon, ham, sausage, hot dogs, corned beef, chipped beef, and packaged luncheon meats. Salt pork. Jerky. Pickled herring. Anchovies, regular canned tuna, and sardines. Salted  nuts. Dairy Processed cheese and cheese spreads. Cheese curds. Blue cheese and cottage cheese. Buttermilk.  Condiments Onion and garlic salt, seasoned salt, table salt, and sea salt. Canned and packaged gravies. Worcestershire sauce. Tartar sauce. Barbecue sauce. Teriyaki sauce. Soy sauce, including reduced sodium. Steak sauce. Fish sauce. Oyster sauce. Cocktail sauce. Horseradish that you find on the shelf. Regular ketchup and mustard. Meat flavorings and tenderizers. Bouillon cubes. Hot sauce. Tabasco sauce. Marinades. Taco seasonings. Relishes. Fats and Oils Regular salad dressings. Salted butter. Margarine. Ghee. Bacon fat.  Other Potato and tortilla chips. Corn chips and puffs. Salted popcorn and pretzels. Canned or dried soups. Pizza. Frozen entrees and pot pies.  The items listed above may not be a complete list of foods and beverages to avoid. Contact your dietitian for more information.   This information is not intended to replace advice given to you by your health care provider. Make sure you discuss any questions you have with your health care provider.   Document Released: 02/05/2002 Document Revised: 09/06/2014 Document Reviewed: 06/20/2013 Elsevier Interactive Patient Education [employer].

## 2015-11-19 LAB — CUP PACEART INCLINIC DEVICE CHECK
Battery Voltage: 3.04 V
Brady Statistic RA Percent Paced: 0.19 %
Implantable Lead Implant Date: 20161221
Implantable Lead Location: 753860
Lead Channel Impedance Value: 725 Ohm
Lead Channel Pacing Threshold Pulse Width: 0.5 ms
Lead Channel Pacing Threshold Pulse Width: 0.5 ms
Lead Channel Sensing Intrinsic Amplitude: 12 mV
Lead Channel Setting Pacing Amplitude: 1.75 V
Lead Channel Setting Pacing Pulse Width: 0.5 ms
MDC IDC LEAD IMPLANT DT: 20161221
MDC IDC LEAD LOCATION: 753860
MDC IDC MSMT BATTERY REMAINING LONGEVITY: 130.8
MDC IDC MSMT LEADCHNL RA IMPEDANCE VALUE: 450 Ohm
MDC IDC MSMT LEADCHNL RA PACING THRESHOLD AMPLITUDE: 0.75 V
MDC IDC MSMT LEADCHNL RA SENSING INTR AMPL: 3 mV
MDC IDC MSMT LEADCHNL RV PACING THRESHOLD AMPLITUDE: 0.5 V
MDC IDC SESS DTM: 20170321161500
MDC IDC SET LEADCHNL RV PACING AMPLITUDE: 0.75 V
MDC IDC SET LEADCHNL RV SENSING SENSITIVITY: 4 mV
MDC IDC STAT BRADY RV PERCENT PACED: 99.88 %
Pulse Gen Serial Number: 7839181

## 2016-02-17 ENCOUNTER — Telehealth: Payer: Self-pay | Admitting: Cardiology

## 2016-02-17 ENCOUNTER — Encounter: Payer: Medicare Other | Admitting: *Deleted

## 2016-02-17 NOTE — Telephone Encounter (Signed)
Spoke with pt and reminded pt of remote transmission that is due today. Pt verbalized understanding.   

## 2016-02-20 ENCOUNTER — Encounter: Payer: Self-pay | Admitting: Cardiology

## 2016-02-27 ENCOUNTER — Ambulatory Visit (INDEPENDENT_AMBULATORY_CARE_PROVIDER_SITE_OTHER): Payer: Medicare Other | Admitting: *Deleted

## 2016-02-27 DIAGNOSIS — I442 Atrioventricular block, complete: Secondary | ICD-10-CM | POA: Diagnosis not present

## 2016-02-27 NOTE — Progress Notes (Signed)
Remote pacemaker transmission.   

## 2016-03-03 LAB — CUP PACEART REMOTE DEVICE CHECK
Brady Statistic RA Percent Paced: 1 % — CL
Date Time Interrogation Session: 20170705145838
Implantable Lead Implant Date: 20161221
Implantable Lead Location: 753860
Lead Channel Impedance Value: 660 Ohm
Lead Channel Pacing Threshold Amplitude: 0.5 V
Lead Channel Pacing Threshold Amplitude: 0.75 V
Lead Channel Pacing Threshold Pulse Width: 0.5 ms
MDC IDC LEAD IMPLANT DT: 20161221
MDC IDC LEAD LOCATION: 753860
MDC IDC MSMT BATTERY VOLTAGE: 3.02 V
MDC IDC MSMT LEADCHNL RA IMPEDANCE VALUE: 450 Ohm
MDC IDC MSMT LEADCHNL RA SENSING INTR AMPL: 2.5 mV
MDC IDC MSMT LEADCHNL RV PACING THRESHOLD PULSEWIDTH: 0.5 ms
MDC IDC STAT BRADY RV PERCENT PACED: 99 %
Pulse Gen Serial Number: 7839181

## 2016-03-05 ENCOUNTER — Encounter: Payer: Self-pay | Admitting: Cardiology

## 2016-03-05 ENCOUNTER — Other Ambulatory Visit: Payer: Self-pay | Admitting: *Deleted

## 2016-03-05 MED ORDER — METOPROLOL TARTRATE 25 MG PO TABS: 25.0000 mg | ORAL_TABLET | Freq: Two times a day (BID) | ORAL | Status: DC

## 2016-03-07 ENCOUNTER — Encounter (HOSPITAL_COMMUNITY): Payer: Self-pay | Admitting: *Deleted

## 2016-03-07 ENCOUNTER — Observation Stay (HOSPITAL_COMMUNITY)
Admission: EM | Admit: 2016-03-07 | Discharge: 2016-03-08 | Disposition: A | Discharge status: Home / Self Care | Payer: Medicare Other | Attending: Internal Medicine | Admitting: Internal Medicine

## 2016-03-07 ENCOUNTER — Emergency Department (HOSPITAL_COMMUNITY): Payer: Medicare Other

## 2016-03-07 DIAGNOSIS — E1122 Type 2 diabetes mellitus with diabetic chronic kidney disease: Secondary | ICD-10-CM | POA: Diagnosis not present

## 2016-03-07 DIAGNOSIS — N182 Chronic kidney disease, stage 2 (mild): Secondary | ICD-10-CM | POA: Diagnosis not present

## 2016-03-07 DIAGNOSIS — R0789 Other chest pain: Secondary | ICD-10-CM | POA: Diagnosis not present

## 2016-03-07 DIAGNOSIS — E7800 Pure hypercholesterolemia, unspecified: Secondary | ICD-10-CM | POA: Insufficient documentation

## 2016-03-07 DIAGNOSIS — R079 Chest pain, unspecified: Secondary | ICD-10-CM | POA: Diagnosis present

## 2016-03-07 DIAGNOSIS — E669 Obesity, unspecified: Secondary | ICD-10-CM | POA: Insufficient documentation

## 2016-03-07 DIAGNOSIS — E1140 Type 2 diabetes mellitus with diabetic neuropathy, unspecified: Secondary | ICD-10-CM

## 2016-03-07 DIAGNOSIS — E785 Hyperlipidemia, unspecified: Secondary | ICD-10-CM | POA: Diagnosis not present

## 2016-03-07 DIAGNOSIS — E875 Hyperkalemia: Secondary | ICD-10-CM | POA: Insufficient documentation

## 2016-03-07 DIAGNOSIS — Z79899 Other long term (current) drug therapy: Secondary | ICD-10-CM | POA: Diagnosis not present

## 2016-03-07 DIAGNOSIS — E1159 Type 2 diabetes mellitus with other circulatory complications: Secondary | ICD-10-CM | POA: Diagnosis present

## 2016-03-07 DIAGNOSIS — I442 Atrioventricular block, complete: Secondary | ICD-10-CM | POA: Diagnosis not present

## 2016-03-07 DIAGNOSIS — E1169 Type 2 diabetes mellitus with other specified complication: Secondary | ICD-10-CM

## 2016-03-07 DIAGNOSIS — D638 Anemia in other chronic diseases classified elsewhere: Secondary | ICD-10-CM | POA: Diagnosis present

## 2016-03-07 DIAGNOSIS — Z8679 Personal history of other diseases of the circulatory system: Secondary | ICD-10-CM | POA: Diagnosis present

## 2016-03-07 DIAGNOSIS — D509 Iron deficiency anemia, unspecified: Secondary | ICD-10-CM | POA: Diagnosis not present

## 2016-03-07 DIAGNOSIS — E1165 Type 2 diabetes mellitus with hyperglycemia: Secondary | ICD-10-CM | POA: Insufficient documentation

## 2016-03-07 DIAGNOSIS — Z7984 Long term (current) use of oral hypoglycemic drugs: Secondary | ICD-10-CM | POA: Insufficient documentation

## 2016-03-07 DIAGNOSIS — I10 Essential (primary) hypertension: Secondary | ICD-10-CM | POA: Diagnosis present

## 2016-03-07 LAB — BASIC METABOLIC PANEL
ANION GAP: 9 (ref 5–15)
BUN: 19 mg/dL (ref 6–20)
CALCIUM: 9.5 mg/dL (ref 8.9–10.3)
CO2: 25 mmol/L (ref 22–32)
Chloride: 98 mmol/L — ABNORMAL LOW (ref 101–111)
Creatinine, Ser: 0.98 mg/dL (ref 0.44–1.00)
GFR calc Af Amer: >60 mL/min (ref >60)
GFR calc non Af Amer: 55 mL/min — ABNORMAL LOW (ref >60)
GLUCOSE: 146 mg/dL — AB (ref 65–99)
POTASSIUM: 6.2 mmol/L — AB (ref 3.5–5.1)
Sodium: 132 mmol/L — ABNORMAL LOW (ref 135–145)

## 2016-03-07 LAB — CBC
HEMATOCRIT: 34.2 % — AB (ref 36.0–46.0)
HEMOGLOBIN: 10.6 g/dL — AB (ref 12.0–15.0)
MCH: 24 pg — AB (ref 26.0–34.0)
MCHC: 31 g/dL (ref 30.0–36.0)
MCV: 77.4 fL — AB (ref 78.0–100.0)
Platelets: 250 10*3/uL (ref 150–400)
RBC: 4.42 MIL/uL (ref 3.87–5.11)
RDW: 15 % (ref 11.5–15.5)
WBC: 7.1 10*3/uL (ref 4.0–10.5)

## 2016-03-07 LAB — I-STAT TROPONIN, ED: Troponin i, poc: 0.01 ng/mL (ref 0.00–0.08)

## 2016-03-07 LAB — CBG MONITORING, ED: GLUCOSE-CAPILLARY: 115 mg/dL — AB (ref 65–99)

## 2016-03-07 NOTE — ED Notes (Signed)
CBG: 115 

## 2016-03-07 NOTE — ED Notes (Signed)
[employer] representative called this RN and reported that there were no abnormal events today.

## 2016-03-07 NOTE — ED Notes (Signed)
Pt arrives via EMS from home. Pt states that she had been having dizziness x 2 hours and called EMS once she started feeling short of breath. Pt ahs pacer. MD placed pt back on lopressor on Friday but pt only took 1 dose because she wanted to talk to the MD. EMS reports pt was ST with PVC's en route.

## 2016-03-07 NOTE — ED Notes (Signed)
Patient asked to to bathroom, asked patient for urine sample. She stated that she did not need one, that Dr was sending her home.

## 2016-03-07 NOTE — ED Provider Notes (Addendum)
CSN: [id number]     Arrival date & time 03/07/16  2122 History   First MD Initiated Contact with Patient 03/07/16 2150     Chief Complaint  Patient presents with  . Dizziness     (Consider location/radiation/quality/duration/timing/severity/associated sxs/prior Treatment) HPI Complains of vague anterior chest discomfort onset 2 hours prior to coming here accompanied by shortness of breath onset while at rest. She denies any sweatiness or nausea. Admits to accompanying lightheadedness and feeling of her heart racing. Symptoms resolved a few minutes after arrival here. She is presently asymptomatic. No treatment prior to coming here. Patient was started on Lopressor 2 days ago by Dr.Canminitz but has not taken it since 03/05/2016 as she felt confused she was started on the medicine before the pacemaker was started. No other associated symptoms. Past Medical History  Diagnosis Date  . Hypertension   . Diabetes mellitus without complication ([place])   . Hypercholesteremia   . Arthritis   . Anemia     takes iron daily   Past Surgical History  Procedure Laterality Date  . Tubal ligation    . Joint replacement Bilateral     '99  . Abdominal hysterectomy    . Colonoscopy with propofol N/A 07/03/2013    Procedure: COLONOSCOPY WITH PROPOFOL;  Surgeon: Garlan Fair, MD;  Location: [facility];  Service: Endoscopy;  Laterality: N/A;  . Left heart catheterization with coronary angiogram N/A 08/27/2014    Procedure: LEFT HEART CATHETERIZATION WITH CORONARY ANGIOGRAM;  Surgeon: Laverda Page, MD;  Location: [facility] CATH LAB;  Service: Cardiovascular;  Laterality: N/A;  . Ep implantable device N/A 08/20/2015    Procedure: Pacemaker Implant;  Surgeon: Evans Lance, MD;  Location: [place] CV LAB;  Service: Cardiovascular;  Laterality: N/A;   Family History  Problem Relation Age of Onset  . Hypertension Father   . Hypertension Mother   . Diabetes Mother   . Cancer Brother    Social History   Substance Use Topics  . Smoking status: Never Smoker   . Smokeless tobacco: Never Used  . Alcohol Use: No   OB History    No data available     Review of Systems  Respiratory: Positive for shortness of breath.   Cardiovascular: Positive for chest pain and leg swelling.       Chronic leg edema  Allergic/Immunologic: Positive for immunocompromised state.       Diabetic  Neurological: Positive for light-headedness.  All other systems reviewed and are negative.     Allergies  Prednisone  Home Medications   Prior to Admission medications   Medication Sig Start Date End Date Taking? Authorizing Provider  acetaminophen (TYLENOL) 500 MG tablet Take 1,000 mg by mouth every 6 (six) hours as needed. Reported on 08/20/2015   Yes Historical Provider, MD  allopurinol (ZYLOPRIM) 100 MG tablet Take 100 mg by mouth every morning.    Yes Historical Provider, MD  amLODipine (NORVASC) 5 MG tablet Take 1 tablet (5 mg total) by mouth daily. 08/08/15  Yes Virgel Manifold, MD  Calcium Citrate-Vitamin D (CALCIUM CITRATE + D PO) Take 1 tablet by mouth daily.   Yes Historical Provider, MD  ferrous sulfate 325 (65 FE) MG EC tablet Take 325 mg by mouth 2 (two) times daily.   Yes Historical Provider, MD  hydrochlorothiazide (MICROZIDE) 12.5 MG capsule Take 12.5 mg by mouth daily.    Yes Historical Provider, MD  irbesartan-hydrochlorothiazide (AVALIDE) 150-12.5 MG per tablet Take 1 tablet by mouth daily.  Yes Historical Provider, MD  magnesium oxide (MAG-OX) 400 MG tablet Take 400 mg by mouth daily.   Yes Historical Provider, MD  meclizine (ANTIVERT) 12.5 MG tablet Take 12.5 mg by mouth 3 (three) times daily as needed for dizziness.  07/07/15  Yes Historical Provider, MD  metFORMIN (GLUCOPHAGE) 1000 MG tablet Take 1,000 mg by mouth 2 (two) times daily with a meal.   Yes Historical Provider, MD  vitamin C (ASCORBIC ACID) 500 MG tablet Take 500 mg by mouth daily.   Yes Historical Provider, MD  metoprolol  tartrate (LOPRESSOR) 25 MG tablet Take 1 tablet (25 mg total) by mouth 2 (two) times daily. 03/05/16   Will Meredith Leeds, MD  nitroGLYCERIN (NITROSTAT) 0.4 MG SL tablet Place 0.4 mg under the tongue every 5 (five) minutes as needed for chest pain.    Historical Provider, MD  OVER THE COUNTER MEDICATION Apply 1 application topically 2 (two) times daily as needed (For pain.). Arthritis Cream    Historical Provider, MD   BP 157/72 mmHg  Pulse 87  Resp 18  SpO2 100% Physical Exam  Constitutional: She appears well-developed and well-nourished.  HENT:  Head: Normocephalic and atraumatic.  Eyes: Conjunctivae are normal. Pupils are equal, round, and reactive to light.  Neck: Neck supple. No tracheal deviation present. No thyromegaly present.  Cardiovascular: Normal rate and regular rhythm.   No murmur heard. Pulmonary/Chest: Effort normal and breath sounds normal.  Abdominal: Soft. Bowel sounds are normal. She exhibits no distension. There is no tenderness.  Obese  Musculoskeletal: Normal range of motion. She exhibits edema. She exhibits no tenderness.  Trace pretibial pitting edema bilaterally  Neurological: She is alert. Coordination normal.  Skin: Skin is warm and dry. No rash noted.  Psychiatric: She has a normal mood and affect.  Nursing note and vitals reviewed.   ED Course  Procedures (including critical care time) Labs Review Labs Reviewed  CBC - Abnormal; Notable for the following:    Hemoglobin 10.6 (*)    HCT 34.2 (*)    MCV 77.4 (*)    MCH 24.0 (*)    All other components within normal limits  CBG MONITORING, ED - Abnormal; Notable for the following:    Glucose-Capillary 115 (*)    All other components within normal limits  URINALYSIS, ROUTINE W REFLEX MICROSCOPIC (NOT AT [facility])    Imaging Review No results found. I have personally reviewed and evaluated these images and lab results as part of my medical decision-making.   EKG Interpretation   Date/Time:  Sunday  March 07 2016 21:25:53 EDT Ventricular Rate:  92 PR Interval:    QRS Duration: 154 QT Interval:  406 QTC Calculation: 503 R Axis:   -75 Text Interpretation:  Sinus rhythm Nonspecific IVCD with LAD Left  ventricular hypertrophy Anterolateral infarct, old No significant change  since last tracing Confirmed by Winfred Leeds  MD, Paolina Karwowski [phone]) on 03/07/2016  10:27:08 PM     Correction. EKG is electronically paced 12:40 AM patient remains asymptomatic. Chest x-ray viewed by me . Patient signed out to [place] am. Pacemaker was interrogated showing no arrhythmia. She last had atrial tachycardia on 02/29/2016. Results for orders placed or performed during the hospital encounter of 03/07/16  CBC  Result Value Ref Range   WBC 7.1 4.0 - 10.5 K/uL   RBC 4.42 3.87 - 5.11 MIL/uL   Hemoglobin 10.6 (L) 12.0 - 15.0 g/dL   HCT 34.2 (L) 36.0 - 46.0 %   MCV 77.4 (L)  78.0 - 100.0 fL   MCH 24.0 (L) 26.0 - 34.0 pg   MCHC 31.0 30.0 - 36.0 g/dL   RDW 15.0 11.5 - 15.5 %   Platelets 250 150 - 400 K/uL  Basic metabolic panel  Result Value Ref Range   Sodium 132 (L) 135 - 145 mmol/L   Potassium 6.2 (H) 3.5 - 5.1 mmol/L   Chloride 98 (L) 101 - 111 mmol/L   CO2 25 22 - 32 mmol/L   Glucose, Bld 146 (H) 65 - 99 mg/dL   BUN 19 6 - 20 mg/dL   Creatinine, Ser 0.98 0.44 - 1.00 mg/dL   Calcium 9.5 8.9 - 10.3 mg/dL   GFR calc non Af Amer 55 (L) >60 mL/min   GFR calc Af Amer >60 >60 mL/min   Anion gap 9 5 - 15  CBG monitoring, ED  Result Value Ref Range   Glucose-Capillary 115 (H) 65 - 99 mg/dL  I-stat troponin, ED  Result Value Ref Range   Troponin i, poc 0.01 0.00 - 0.08 ng/mL   Comment 3          I-stat chem 8, ed  Result Value Ref Range   Sodium 136 135 - 145 mmol/L   Potassium 4.9 3.5 - 5.1 mmol/L   Chloride 98 (L) 101 - 111 mmol/L   BUN 24 (H) 6 - 20 mg/dL   Creatinine, Ser 1.00 0.44 - 1.00 mg/dL   Glucose, Bld 161 (H) 65 - 99 mg/dL   Calcium, Ion 1.18 1.12 - 1.23 mmol/L   TCO2 27 0 - 100  mmol/L   Hemoglobin 12.6 12.0 - 15.0 g/dL   HCT 37.0 36.0 - 46.0 %   Dg Chest Port 1 View  03/08/2016  CLINICAL DATA:  75 year old female with chest pain EXAM: PORTABLE CHEST 1 VIEW COMPARISON:  Chest radiograph dated 09/16/2015 FINDINGS: Single portable view of the chest demonstrate shallow inspiration. There is no focal consolidation, pleural effusion, or pneumothorax. The cardiac silhouette is within normal limits. Left pectoral pacemaker device. There is degenerative changes of the shoulders and AC noted no acute fracture. IMPRESSION: No active disease. Electronically Signed   By: Anner Crete M.D.   On: 03/08/2016 00:22    MDM  Dr Johnney Killian to disposition pt in conjunction with cardiologist.Hyperkalemia seen on basic metabolic panel secondary to hemolysis. I-STAT 8 shows normal potassium.as soon as possible ordered. anemia is chronic Dx #1 chest pain #2 anemia #3 hyperglycemia #4 weakness  Final diagnoses:  None        Orlie Dakin, MD 03/08/16 [id number]  Orlie Dakin, MD 03/08/16 [id number]  Orlie Dakin, MD 03/08/16 [id number]  Orlie Dakin, MD 03/08/16 [id number]

## 2016-03-08 DIAGNOSIS — E1159 Type 2 diabetes mellitus with other circulatory complications: Secondary | ICD-10-CM | POA: Diagnosis present

## 2016-03-08 DIAGNOSIS — D509 Iron deficiency anemia, unspecified: Secondary | ICD-10-CM | POA: Diagnosis not present

## 2016-03-08 DIAGNOSIS — I10 Essential (primary) hypertension: Secondary | ICD-10-CM | POA: Diagnosis present

## 2016-03-08 DIAGNOSIS — E119 Type 2 diabetes mellitus without complications: Secondary | ICD-10-CM | POA: Diagnosis not present

## 2016-03-08 DIAGNOSIS — I442 Atrioventricular block, complete: Secondary | ICD-10-CM | POA: Diagnosis not present

## 2016-03-08 DIAGNOSIS — E1140 Type 2 diabetes mellitus with diabetic neuropathy, unspecified: Secondary | ICD-10-CM

## 2016-03-08 DIAGNOSIS — D638 Anemia in other chronic diseases classified elsewhere: Secondary | ICD-10-CM | POA: Diagnosis present

## 2016-03-08 DIAGNOSIS — I152 Hypertension secondary to endocrine disorders: Secondary | ICD-10-CM | POA: Diagnosis present

## 2016-03-08 DIAGNOSIS — E669 Obesity, unspecified: Secondary | ICD-10-CM | POA: Diagnosis not present

## 2016-03-08 DIAGNOSIS — E1169 Type 2 diabetes mellitus with other specified complication: Secondary | ICD-10-CM

## 2016-03-08 DIAGNOSIS — R0789 Other chest pain: Secondary | ICD-10-CM | POA: Diagnosis not present

## 2016-03-08 LAB — I-STAT CHEM 8, ED
BUN: 24 mg/dL — AB (ref 6–20)
CHLORIDE: 98 mmol/L — AB (ref 101–111)
Calcium, Ion: 1.18 mmol/L (ref 1.12–1.23)
Creatinine, Ser: 1 mg/dL (ref 0.44–1.00)
Glucose, Bld: 161 mg/dL — ABNORMAL HIGH (ref 65–99)
HEMATOCRIT: 37 % (ref 36.0–46.0)
Hemoglobin: 12.6 g/dL (ref 12.0–15.0)
Potassium: 4.9 mmol/L (ref 3.5–5.1)
SODIUM: 136 mmol/L (ref 135–145)
TCO2: 27 mmol/L (ref 0–100)

## 2016-03-08 LAB — GLUCOSE, CAPILLARY: Glucose-Capillary: 146 mg/dL — ABNORMAL HIGH (ref 65–99)

## 2016-03-08 LAB — MAGNESIUM: MAGNESIUM: 1.5 mg/dL — AB (ref 1.7–2.4)

## 2016-03-08 LAB — TROPONIN I: Troponin I: <0.03 ng/mL (ref <0.03)

## 2016-03-08 MED ORDER — ENOXAPARIN SODIUM 40 MG/0.4ML ~~LOC~~ SOLN
40.0000 mg | SUBCUTANEOUS | Status: DC
  Filled 2016-03-08 (×2): qty 0.4

## 2016-03-08 MED ORDER — ASPIRIN 81 MG PO CHEW
324.0000 mg | CHEWABLE_TABLET | Freq: Once | ORAL | Status: AC
  Administered 2016-03-08: 324 mg via ORAL
  Filled 2016-03-08: qty 4

## 2016-03-08 MED ORDER — ONDANSETRON HCL 4 MG/2ML IJ SOLN: 4.0000 mg | Freq: Four times a day (QID) | INTRAMUSCULAR | Status: DC | PRN

## 2016-03-08 MED ORDER — MORPHINE SULFATE (PF) 2 MG/ML IV SOLN: 2.0000 mg | INTRAVENOUS | Status: DC | PRN

## 2016-03-08 MED ORDER — GI COCKTAIL ~~LOC~~: 30.0000 mL | Freq: Four times a day (QID) | ORAL | Status: DC | PRN

## 2016-03-08 MED ORDER — FERROUS SULFATE 325 (65 FE) MG PO TABS
325.0000 mg | ORAL_TABLET | Freq: Two times a day (BID) | ORAL | Status: DC
  Administered 2016-03-08: 325 mg via ORAL
  Filled 2016-03-08: qty 1

## 2016-03-08 MED ORDER — AMLODIPINE BESYLATE 5 MG PO TABS
5.0000 mg | ORAL_TABLET | Freq: Every day | ORAL | Status: DC
  Administered 2016-03-08: 5 mg via ORAL
  Filled 2016-03-08: qty 1

## 2016-03-08 MED ORDER — METOPROLOL TARTRATE 25 MG PO TABS
25.0000 mg | ORAL_TABLET | Freq: Two times a day (BID) | ORAL | Status: DC
  Administered 2016-03-08: 25 mg via ORAL
  Filled 2016-03-08: qty 1

## 2016-03-08 MED ORDER — INSULIN ASPART 100 UNIT/ML ~~LOC~~ SOLN
0.0000 [IU] | Freq: Three times a day (TID) | SUBCUTANEOUS | Status: DC
  Administered 2016-03-08: 1 [IU] via SUBCUTANEOUS

## 2016-03-08 MED ORDER — NITROGLYCERIN 0.4 MG SL SUBL
SUBLINGUAL_TABLET | SUBLINGUAL | Status: AC
  Administered 2016-03-08: 0.4 mg via SUBLINGUAL
  Filled 2016-03-08: qty 1

## 2016-03-08 MED ORDER — ACETAMINOPHEN 325 MG PO TABS: 650.0000 mg | ORAL_TABLET | ORAL | Status: DC | PRN

## 2016-03-08 MED ORDER — VITAMIN C 500 MG PO TABS
500.0000 mg | ORAL_TABLET | Freq: Every day | ORAL | Status: DC
  Administered 2016-03-08: 500 mg via ORAL
  Filled 2016-03-08: qty 1

## 2016-03-08 MED ORDER — NITROGLYCERIN 0.4 MG SL SUBL
0.4000 mg | SUBLINGUAL_TABLET | SUBLINGUAL | Status: DC | PRN
  Administered 2016-03-08 (×3): 0.4 mg via SUBLINGUAL

## 2016-03-08 MED ORDER — ALLOPURINOL 100 MG PO TABS
100.0000 mg | ORAL_TABLET | Freq: Every morning | ORAL | Status: DC
Start: 2016-03-08 — End: 2016-03-08
  Administered 2016-03-08: 100 mg via ORAL
  Filled 2016-03-08: qty 1

## 2016-03-08 MED ORDER — MECLIZINE HCL 12.5 MG PO TABS: 12.5000 mg | ORAL_TABLET | Freq: Three times a day (TID) | ORAL | Status: DC | PRN

## 2016-03-08 MED ORDER — MAGNESIUM OXIDE 400 (241.3 MG) MG PO TABS
400.0000 mg | ORAL_TABLET | Freq: Every day | ORAL | Status: DC
  Administered 2016-03-08: 400 mg via ORAL
  Filled 2016-03-08: qty 1

## 2016-03-08 NOTE — H&P (Signed)
History and Physical    Krista Clarke [id number] DOB: June 09, 1941 DOA: 03/07/2016  Referring MD/NP/PA: Dr. Johnney Killian PCP: Jilda Panda, MD  Patient coming from: Home  Chief Complaint: Dizziness   HPI: Krista Clarke is a 75 y.o. female with medical history significant of HTN, HLD, iron deficiency anemia, diabetes mellitus type 2, and complete heart block s/p PM; who presents with complaints of shortness of breath with some chest discomfort while at rest. Symptoms started last night while she was at rest watching television. Describes this discomfort as a "floppy sensation inside my chest". The sensation did not move anywhere, but made her feel as though she needed to catch her breath. She reports that she's not had any, chest discomfort since the pacemaker had been placed. Associated symptoms included some mild nausea and lightheadedness with standing. Denies having any wheezing, cough, vomiting, diaphoresis, abdominal pain, diarrhea, decrease in oral intake, loss of consciousness. The patient notes that she had taken all her daily medications and did not try and take anything extra to relieve symptoms. Symptoms lasted less than 2 hours in duration and resolved on their own. Of note the patient was started on Lopressor 2 days ago by Dr. Alfonse Ras, but has not taken it since 7/7 due to confusion on why this medication was started.   ED Course: Upon admission to the emergency department patient was evaluated and seen to be afebrile vital signs relatively within normal limits. Lab work revealed WBC of 7.1, hemoglobin 10.6, platelets 259. Initial potassium noted to be 6.2, for which recheck was done and noted to be 4.9. Initial troponin 1 negative. Chest x-ray showed no acute abnormalities. Patient's pacemaker was interrogated and seen to be functioning normally. Cardiology to see in a.m.  Review of Systems: As per HPI otherwise 10 point review of systems negative.   Past Medical History  Diagnosis Date  .  Hypertension   . Diabetes mellitus without complication ([place])   . Hypercholesteremia   . Arthritis   . Anemia     takes iron daily    Past Surgical History  Procedure Laterality Date  . Tubal ligation    . Joint replacement Bilateral     '99  . Abdominal hysterectomy    . Colonoscopy with propofol N/A 07/03/2013    Procedure: COLONOSCOPY WITH PROPOFOL;  Surgeon: Garlan Fair, MD;  Location: [facility]OS[facility];  Service: Endoscopy;  Laterality: N/A;  . Left heart catheterization with coronary angiogram N/A 08/27/2014    Procedure: LEFT HEART CATHETERIZATION WITH CORONARY ANGIOGRAM;  Surgeon: Laverda Page, MD;  Location: [facility] CATH LAB;  Service: Cardiovascular;  Laterality: N/A;  . Ep implantable device N/A 08/20/2015    Procedure: Pacemaker Implant;  Surgeon: Evans Lance, MD;  Location: [place] CV LAB;  Service: Cardiovascular;  Laterality: N/A;     reports that she has never smoked. She has never used smokeless tobacco. She reports that she does not drink alcohol or use illicit drugs.  Allergies  Allergen Reactions  . Prednisone Palpitations and Other (See Comments)    Doesn't work well for patient    Family History  Problem Relation Age of Onset  . Hypertension Father   . Hypertension Mother   . Diabetes Mother   . Cancer Brother     Prior to Admission medications   Medication Sig Start Date End Date Taking? Authorizing Provider  acetaminophen (TYLENOL) 500 MG tablet Take 1,000 mg by mouth every 6 (six) hours as needed. Reported on 08/20/2015  Yes Historical Provider, MD  allopurinol (ZYLOPRIM) 100 MG tablet Take 100 mg by mouth every morning.    Yes Historical Provider, MD  amLODipine (NORVASC) 5 MG tablet Take 1 tablet (5 mg total) by mouth daily. 08/08/15  Yes Virgel Manifold, MD  Calcium Citrate-Vitamin D (CALCIUM CITRATE + D PO) Take 1 tablet by mouth daily.   Yes Historical Provider, MD  ferrous sulfate 325 (65 FE) MG EC tablet Take 325 mg by mouth 2 (two)  times daily.   Yes Historical Provider, MD  hydrochlorothiazide (MICROZIDE) 12.5 MG capsule Take 12.5 mg by mouth daily.    Yes Historical Provider, MD  irbesartan-hydrochlorothiazide (AVALIDE) 150-12.5 MG per tablet Take 1 tablet by mouth daily.   Yes Historical Provider, MD  magnesium oxide (MAG-OX) 400 MG tablet Take 400 mg by mouth daily.   Yes Historical Provider, MD  meclizine (ANTIVERT) 12.5 MG tablet Take 12.5 mg by mouth 3 (three) times daily as needed for dizziness.  07/07/15  Yes Historical Provider, MD  metFORMIN (GLUCOPHAGE) 1000 MG tablet Take 1,000 mg by mouth 2 (two) times daily with a meal.   Yes Historical Provider, MD  vitamin C (ASCORBIC ACID) 500 MG tablet Take 500 mg by mouth daily.   Yes Historical Provider, MD  metoprolol tartrate (LOPRESSOR) 25 MG tablet Take 1 tablet (25 mg total) by mouth 2 (two) times daily. 03/05/16   Will Meredith Leeds, MD  nitroGLYCERIN (NITROSTAT) 0.4 MG SL tablet Place 0.4 mg under the tongue every 5 (five) minutes as needed for chest pain.    Historical Provider, MD  OVER THE COUNTER MEDICATION Apply 1 application topically 2 (two) times daily as needed (For pain.). Arthritis Cream    Historical Provider, MD    Physical Exam:   Constitutional: Elderly female who appears to be in some discomfort laying in the hospital gurney  Filed Vitals:   03/08/16 0030 03/08/16 0100 03/08/16 0130 03/08/16 0200  BP: 148/60 143/56 138/55 142/50  Pulse: 70 77 75 70  Temp:      TempSrc:      Resp: 22 14 19 16   SpO2: 100% 100% 100% 99%   Eyes: PERRL, lids and conjunctivae normal ENMT: Mucous membranes are moist. Posterior pharynx clear of any exudate or lesions.Normal dentition.  Neck: normal, supple, no masses, no thyromegaly Respiratory: clear to auscultation bilaterally, no wheezing, no crackles. Normal respiratory effort. No accessory muscle use.  Cardiovascular: Regular rate and rhythm, no murmurs / rubs / gallops. No extremity edema. 2+ pedal pulses. No  carotid bruits. Pacemaker underneath the left pectoral chest wall. Abdomen: no tenderness, no masses palpated. No hepatosplenomegaly. Bowel sounds positive.  Musculoskeletal: no clubbing / cyanosis. No joint deformity upper and lower extremities. Good ROM, no contractures. Normal muscle tone.  Skin: no rashes, lesions, ulcers. No induration Neurologic: CN 2-12 grossly intact. Sensation intact, DTR normal. Strength 5/5 in all 4.  Psychiatric: Normal judgment and insight. Alert and oriented x 3. Normal mood.     Labs on Admission: I have personally reviewed following labs and imaging studies  CBC:  Recent Labs Lab 03/07/16 2139 03/08/16 0002  WBC 7.1  --   HGB 10.6* 12.6  HCT 34.2* 37.0  MCV 77.4*  --   PLT 250  --    Basic Metabolic Panel:  Recent Labs Lab 03/07/16 2237 03/08/16 0002  NA 132* 136  K 6.2* 4.9  CL 98* 98*  CO2 25  --   GLUCOSE 146* 161*  BUN 19 24*  CREATININE 0.98 1.00  CALCIUM 9.5  --    GFR: CrCl cannot be calculated (Unknown ideal weight.). Liver Function Tests: No results for input(s): AST, ALT, ALKPHOS, BILITOT, PROT, ALBUMIN in the last 168 hours. No results for input(s): LIPASE, AMYLASE in the last 168 hours. No results for input(s): AMMONIA in the last 168 hours. Coagulation Profile: No results for input(s): INR, PROTIME in the last 168 hours. Cardiac Enzymes: No results for input(s): CKTOTAL, CKMB, CKMBINDEX, TROPONINI in the last 168 hours. BNP (last 3 results) No results for input(s): PROBNP in the last 8760 hours. HbA1C: No results for input(s): HGBA1C in the last 72 hours. CBG:  Recent Labs Lab 03/07/16 2130  GLUCAP 115*   Lipid Profile: No results for input(s): CHOL, HDL, LDLCALC, TRIG, CHOLHDL, LDLDIRECT in the last 72 hours. Thyroid Function Tests: No results for input(s): TSH, T4TOTAL, FREET4, T3FREE, THYROIDAB in the last 72 hours. Anemia Panel: No results for input(s): VITAMINB12, FOLATE, FERRITIN, TIBC, IRON,  RETICCTPCT in the last 72 hours. Urine analysis:    Component Value Date/Time   COLORURINE YELLOW 08/20/2015 [place] 08/20/2015 1123   LABSPEC 1.015 08/20/2015 1123   PHURINE 6.5 08/20/2015 1123   GLUCOSEU NEGATIVE 08/20/2015 1123   HGBUR NEGATIVE 08/20/2015 1123   BILIRUBINUR NEGATIVE 08/20/2015 1123   KETONESUR NEGATIVE 08/20/2015 1123   PROTEINUR 100* 08/20/2015 1123   UROBILINOGEN 0.2 09/18/2013 1745   NITRITE NEGATIVE 08/20/2015 1123   LEUKOCYTESUR NEGATIVE 08/20/2015 1123   Sepsis Labs: No results found for this or any previous visit (from the past 240 hour(s)).   Radiological Exams on Admission: Dg Chest Port 1 View  03/08/2016  CLINICAL DATA:  75 year old female with chest pain EXAM: PORTABLE CHEST 1 VIEW COMPARISON:  Chest radiograph dated 09/16/2015 FINDINGS: Single portable view of the chest demonstrate shallow inspiration. There is no focal consolidation, pleural effusion, or pneumothorax. The cardiac silhouette is within normal limits. Left pectoral pacemaker device. There is degenerative changes of the shoulders and AC noted no acute fracture. IMPRESSION: No active disease. Electronically Signed   By: Anner Crete M.D.   On: 03/08/2016 00:22    EKG: Independently reviewed. Sinus rhythm with IVCD  Assessment/Plan Chest discomfort/ pain: Acute. Patient with reports of anterior chest discomfort with shortness of breath and some mild nausea. Patient with factors of  age, hypertension, diabetes, obesity, and HLD. Patient's last echocardiogram was performed and 11/2014 showing EF of 63% with grade 2 diastolic dysfunction  - Admit to a telemetry bed  - Trend cardiac enzymes - check echocardiogram - Notify cardiology in a.m.  Iron deficiency anemia: Hemoglobin 10.6 on admission that appears near patient's regular baseline. - Continue ferrous sulfate - Will repeat CBC  Diabetes mellitus type 2: Patient on oral medication of metformin. Blood glucose  centimeters as 164 admission - Held metformin - CBG every before meals with sensitive sliding scale insulin  Essential hypertension: Patient had not reportedly been on metoprolol since 7/7. - Will need to verify that the patient is to be on Avalide(irbesartan/hctz) and HCTZ  - Continue amlodipine    DVT prophylaxis: Lovenox Code Status: Full Family Communication: Discussed overall plan with the patient and daughter present at bedside Disposition Plan: Possible discharge in 1-2 days depending on findings  Consults called: none   Admission status:  telemetry observation  Norval Morton MD [facility] Pager [phone]  If 7PM-7AM, please contact night-coverage [email or web address] Password [email or web address]  03/08/2016, 2:14 AM

## 2016-03-08 NOTE — Discharge Summary (Signed)
Physician Discharge Summary  Krista Clarke [id number] DOB: [birth year] DOA: 03/07/2016  PCP: Jilda Panda, MD  Admit date: 03/07/2016 Discharge date: 03/08/2016  Admitted From: Home  Disposition: HOme.   Recommendations for Outpatient Follow-up:  1. Follow up with PCP in 1-2 weeks 2. Please obtain BMP/CBC in one week 3. Please follow up with cardiology as recommended.     Discharge Condition:stable.  CODE STATUS:full code.  Diet recommendation: Heart Healthy / Carb Modified /  Brief/Interim Summary: Krista Clarke is a 74 y.o. female with medical history significant of HTN, HLD, iron deficiency anemia, diabetes mellitus type 2, and complete heart block s/p PM; who presents with complaints of shortness of breath with some chest discomfort while at rest and some dizziness.   Discharge Diagnoses:  Principal Problem:   Chest discomfort Active Problems:   Complete heart block (HCC)   Essential hypertension   Anemia, iron deficiency   Diabetes mellitus type 2 in obese [facility])  Chest discomfort: ACS ruled out with negative enzymes. Symptoms resolved spontaneously.  Cardiology consulted and recommendations given to discharge the patient, no need for further work up.  Pt is up and walking in the room without any symptoms.  Outpatient follow up with cardiology if symptoms re occur.    Iron deficiency anemia; stable.   Type 2 Dm: CBG (last 3)   Recent Labs  03/07/16 2130 03/08/16 0634  GLUCAP 115* 146*    Resume SSI.   Hypertension:  Resume home meds .   Discharge Instructions  Discharge Instructions    Diet - low sodium heart healthy    Complete by:  As directed      Discharge instructions    Complete by:  As directed   Please follow up with cardiology as needed.  Please follow up with PCP , in 1 to2 weeks, post hospitalization visit.            Medication List    STOP taking these medications        hydrochlorothiazide 12.5 MG capsule  Commonly known as:   MICROZIDE     OVER THE COUNTER MEDICATION      TAKE these medications        acetaminophen 500 MG tablet  Commonly known as:  TYLENOL  Take 1,000 mg by mouth every 6 (six) hours as needed. Reported on 08/20/2015     allopurinol 100 MG tablet  Commonly known as:  ZYLOPRIM  Take 100 mg by mouth every morning.     amLODipine 5 MG tablet  Commonly known as:  NORVASC  Take 1 tablet (5 mg total) by mouth daily.     CALCIUM CITRATE + D PO  Take 1 tablet by mouth daily.     ferrous sulfate 325 (65 FE) MG EC tablet  Take 325 mg by mouth 2 (two) times daily.     irbesartan-hydrochlorothiazide 150-12.5 MG tablet  Commonly known as:  AVALIDE  Take 1 tablet by mouth daily.     magnesium oxide 400 MG tablet  Commonly known as:  MAG-OX  Take 400 mg by mouth daily.     meclizine 12.5 MG tablet  Commonly known as:  ANTIVERT  Take 12.5 mg by mouth 3 (three) times daily as needed for dizziness.     metFORMIN 1000 MG tablet  Commonly known as:  GLUCOPHAGE  Take 1,000 mg by mouth 2 (two) times daily with a meal.     metoprolol tartrate 25 MG tablet  Commonly known as:  LOPRESSOR  Take 1 tablet (25 mg total) by mouth 2 (two) times daily.     nitroGLYCERIN 0.4 MG SL tablet  Commonly known as:  NITROSTAT  Place 0.4 mg under the tongue every 5 (five) minutes as needed for chest pain.     vitamin C 500 MG tablet  Commonly known as:  ASCORBIC ACID  Take 500 mg by mouth daily.           Follow-up Information    Follow up with Jilda Panda, MD. Schedule an appointment as soon as possible for a visit in 1 week.   Specialty:  Internal Medicine   Why:  post hospitalization visit.    Contact information:   [address] [place] [address] [phone]      Allergies  Allergen Reactions  . Prednisone Palpitations and Other (See Comments)    Doesn't work well for patient    Consultations: Cardiology.   Procedures/Studies: Dg Chest Port 1 View  03/08/2016  CLINICAL DATA:   75 year old female with chest pain EXAM: PORTABLE CHEST 1 VIEW COMPARISON:  Chest radiograph dated 09/16/2015 FINDINGS: Single portable view of the chest demonstrate shallow inspiration. There is no focal consolidation, pleural effusion, or pneumothorax. The cardiac silhouette is within normal limits. Left pectoral pacemaker device. There is degenerative changes of the shoulders and AC noted no acute fracture. IMPRESSION: No active disease. Electronically Signed   By: Anner Crete M.D.   On: 03/08/2016 00:22       Subjective: No chest pain or sob.   Discharge Exam: Filed Vitals:   03/08/16 0614 03/08/16 0943  BP: 125/57 157/52  Pulse: 81 75  Temp:    Resp: 16    Filed Vitals:   03/08/16 0555 03/08/16 0607 03/08/16 0614 03/08/16 0943  BP: 172/70 145/63 125/57 157/52  Pulse: 75 79 81 75  Temp:      TempSrc:      Resp:   16   Height:      Weight:      SpO2: 100% 100% 97%     General: Pt is alert, awake, not in acute distress Cardiovascular: RRR, S1/S2 +, no rubs, no gallops Respiratory: CTA bilaterally, no wheezing, no rhonchi Abdominal: Soft, NT, ND, bowel sounds + Extremities: no edema, no cyanosis    The results of significant diagnostics from this hospitalization (including imaging, microbiology, ancillary and laboratory) are listed below for reference.     Microbiology: No results found for this or any previous visit (from the past 240 hour(s)).   Labs: BNP (last 3 results)  Recent Labs  08/20/15 1154  BNP [id number]*   Basic Metabolic Panel:  Recent Labs Lab 03/07/16 2237 03/08/16 0002 03/08/16 0331  NA 132* 136  --   K 6.2* 4.9  --   CL 98* 98*  --   CO2 25  --   --   GLUCOSE 146* 161*  --   BUN 19 24*  --   CREATININE 0.98 1.00  --   CALCIUM 9.5  --   --   MG  --   --  1.5*   Liver Function Tests: No results for input(s): AST, ALT, ALKPHOS, BILITOT, PROT, ALBUMIN in the last 168 hours. No results for input(s): LIPASE, AMYLASE in the last 168  hours. No results for input(s): AMMONIA in the last 168 hours. CBC:  Recent Labs Lab 03/07/16 2139 03/08/16 0002  WBC 7.1  --   HGB 10.6* 12.6  HCT 34.2* 37.0  MCV 77.4*  --  PLT 250  --    Cardiac Enzymes:  Recent Labs Lab 03/08/16 0331 03/08/16 0607  TROPONINI <0.03 <0.03   BNP: Invalid input(s): POCBNP CBG:  Recent Labs Lab 03/07/16 2130 03/08/16 0634  GLUCAP 115* 146*   D-Dimer No results for input(s): DDIMER in the last 72 hours. Hgb A1c No results for input(s): HGBA1C in the last 72 hours. Lipid Profile No results for input(s): CHOL, HDL, LDLCALC, TRIG, CHOLHDL, LDLDIRECT in the last 72 hours. Thyroid function studies No results for input(s): TSH, T4TOTAL, T3FREE, THYROIDAB in the last 72 hours.  Invalid input(s): FREET3 Anemia work up No results for input(s): VITAMINB12, FOLATE, FERRITIN, TIBC, IRON, RETICCTPCT in the last 72 hours. Urinalysis    Component Value Date/Time   COLORURINE YELLOW 08/20/2015 [place] 08/20/2015 1123   LABSPEC 1.015 08/20/2015 1123   PHURINE 6.5 08/20/2015 1123   GLUCOSEU NEGATIVE 08/20/2015 1123   HGBUR NEGATIVE 08/20/2015 1123   BILIRUBINUR NEGATIVE 08/20/2015 1123   KETONESUR NEGATIVE 08/20/2015 1123   PROTEINUR 100* 08/20/2015 1123   UROBILINOGEN 0.2 09/18/2013 1745   NITRITE NEGATIVE 08/20/2015 1123   LEUKOCYTESUR NEGATIVE 08/20/2015 1123   Sepsis Labs Invalid input(s): PROCALCITONIN,  WBC,  LACTICIDVEN Microbiology No results found for this or any previous visit (from the past 240 hour(s)).   Time coordinating discharge: Over 30 minutes  SIGNED:   Hosie Poisson, MD  [facility] 03/08/2016, 10:13 AM Pager [phone]  If 7PM-7AM, please contact night-coverage [email or web address] Password [email or web address]

## 2016-03-08 NOTE — Progress Notes (Signed)
Pt complained of CP 6/10. Nitro x3 given. EKG performed.  Pain now 0/10. Vitals stable. Pt is now resting comfortably. MD notified. Will continue to monitor pt closely.

## 2016-03-08 NOTE — ED Notes (Signed)
PAGED CARDS/AZEEM TO [place]

## 2016-03-08 NOTE — Consult Note (Signed)
CARDIOLOGY CONSULT NOTE  Patient ID: Krista Clarke MRN: [id number] DOB/AGE: 76-24-1942 76 y.o.  Admit date: 03/07/2016 Referring Physician: Internal Medicine Primary Physician:  Jilda Panda, MD Reason for Consultation: Chest pain, SOB  HPI: Krista Clarke  is a 75 y.o. 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 She had initially been seen in December 2015 for chest pain and underwent coronary angiography which revealed torturous coronary arteries secondary to hypertension, but was otherwise normal. Symptoms had significantly improved with improved blood pressure control. She was last seen in November 2016 for fatigue and malaise and was noted to be markedly bradycardic. Metoprolol was discontinued and she was referred here for further evaluation. She was still bradycardic with Mobitz II HB on EKG. She was referred to EP for evaluation and was recommended PPM implantation but wanted to hold off until after the holidays. However, she presented to [facility] ER on 08/19/2016 with symptomatic bradycardia with 2:1 heart block with intermittent CHB and then underwent PPM implant.   She presented to the ED on 7/9/20176 with a sensation of palpitations and shortness of breath at rest. She states she was sitting on the couch and felt a "flip flopping" in her chest that lasted for at least an hour. Symptoms had improved by EMS arrival and resolved by the time she got to the ED. Of note, on 02/27/2016, she was started back on metoprolol 25mg  bid by Dr. Allegra Lai after remote pacemaker interrogation revealed episodes of atrial tachycardia. She took one dose and then remembered that this had been stopped prior to pacemaker implantation and was scared to take anymore.   Past Medical History  Diagnosis Date  . Hypertension   . Diabetes mellitus without complication ([place])   . Hypercholesteremia   . Arthritis   . Anemia     takes iron daily     Past Surgical History   Procedure Laterality Date  . Tubal ligation    . Joint replacement Bilateral     '99  . Abdominal hysterectomy    . Colonoscopy with propofol N/A 07/03/2013    Procedure: COLONOSCOPY WITH PROPOFOL;  Surgeon: Garlan Fair, MD;  Location: [facility] ENDOSCOPY;  Service: Endoscopy;  Laterality: N/A;  . Left heart catheterization with coronary angiogram N/A 08/27/2014    Procedure: LEFT HEART CATHETERIZATION WITH CORONARY ANGIOGRAM;  Surgeon: Laverda Page, MD;  Location: [facility] CATH LAB;  Service: Cardiovascular;  Laterality: N/A;  . Ep implantable device N/A 08/20/2015    Procedure: Pacemaker Implant;  Surgeon: Evans Lance, MD;  Location: [facility] CV LAB;  Service: Cardiovascular;  Laterality: N/A;     Family History  Problem Relation Age of Onset  . Hypertension Father   . Hypertension Mother   . Diabetes Mother   . Cancer Brother      Social History: Social History   Social History  . Marital Status: Widowed    Spouse Name: N/A  . Number of Children: N/A  . Years of Education: N/A   Occupational History  . Not on file.   Social History Main Topics  . Smoking status: Never Smoker   . Smokeless tobacco: Never Used  . Alcohol Use: No  . Drug Use: No  . Sexual Activity: Not Currently   Other Topics Concern  . Not on file   Social History Narrative     Prescriptions prior to admission  Medication Sig Dispense Refill Last Dose  . acetaminophen (TYLENOL) 500  MG tablet Take 1,000 mg by mouth every 6 (six) hours as needed. Reported on 08/20/2015   Past Month at Unknown time  . allopurinol (ZYLOPRIM) 100 MG tablet Take 100 mg by mouth every morning.    03/07/2016 at Unknown time  . amLODipine (NORVASC) 5 MG tablet Take 1 tablet (5 mg total) by mouth daily. 30 tablet 0 03/07/2016 at Unknown time  . Calcium Citrate-Vitamin D (CALCIUM CITRATE + D PO) Take 1 tablet by mouth daily.   03/07/2016 at Unknown time  . ferrous sulfate 325 (65 FE) MG EC tablet Take 325 mg by mouth 2 (two)  times daily.   03/07/2016 at Unknown time  . hydrochlorothiazide (MICROZIDE) 12.5 MG capsule Take 12.5 mg by mouth daily.    03/07/2016 at Unknown time  . irbesartan-hydrochlorothiazide (AVALIDE) 150-12.5 MG per tablet Take 1 tablet by mouth daily.   03/07/2016 at Unknown time  . magnesium oxide (MAG-OX) 400 MG tablet Take 400 mg by mouth daily.   03/07/2016 at Unknown time  . meclizine (ANTIVERT) 12.5 MG tablet Take 12.5 mg by mouth 3 (three) times daily as needed for dizziness.    Past Month at Unknown time  . metFORMIN (GLUCOPHAGE) 1000 MG tablet Take 1,000 mg by mouth 2 (two) times daily with a meal.   03/07/2016 at Unknown time  . vitamin C (ASCORBIC ACID) 500 MG tablet Take 500 mg by mouth daily.   03/07/2016 at Unknown time  . metoprolol tartrate (LOPRESSOR) 25 MG tablet Take 1 tablet (25 mg total) by mouth 2 (two) times daily. 60 tablet 3 03/05/2016 at on hold  . nitroGLYCERIN (NITROSTAT) 0.4 MG SL tablet Place 0.4 mg under the tongue every 5 (five) minutes as needed for chest pain.   prn  . OVER THE COUNTER MEDICATION Apply 1 application topically 2 (two) times daily as needed (For pain.). Arthritis Cream   Taking     ROS: General: no fevers/chills/night sweats Eyes: no blurry vision, diplopia, or amaurosis ENT: no sore throat or hearing loss Resp: Reports SOB with palpitations; no cough, wheezing, or hemoptysis CV: Palpitations with atypical chest discomfort, trace edema; no PND or orthopnea GI: no abdominal pain, nausea, vomiting, diarrhea, or constipation GU: no dysuria, frequency, or hematuria Skin: no rash Neuro: no headache, numbness, tingling, or weakness of extremities Musculoskeletal: no joint pain or swelling Heme: no bleeding, DVT, or easy bruising Endo: no polydipsia or polyuria    Physical Exam: Blood pressure 125/57, pulse 81, temperature 97.6 F (36.4 C), temperature source Oral, resp. rate 16, height 5\' 3"  (1.6 m), weight 99 kg (218 lb 4.1 oz), SpO2 97 %.   General  appearance: alert, cooperative, appears stated age and moderately obese Neck: no adenopathy, no carotid bruit, no JVD, supple, symmetrical, trachea midline and thyroid not enlarged, symmetric, no tenderness/mass/nodules Lungs: clear to auscultation bilaterally Heart: regular rate and rhythm, S1, S2 normal, no murmur, click, rub or gallop Extremities: trace edema bilaterally Pulses: 2+ and symmetric Neurologic: Grossly normal  Labs:   Lab Results  Component Value Date   WBC 7.1 03/07/2016   HGB 12.6 03/08/2016   HCT 37.0 03/08/2016   MCV 77.4* 03/07/2016   PLT 250 03/07/2016    Recent Labs Lab 03/07/16 2237 03/08/16 0002  NA 132* 136  K 6.2* 4.9  CL 98* 98*  CO2 25  --   BUN 19 24*  CREATININE 0.98 1.00  CALCIUM 9.5  --   GLUCOSE 146* 161*    Lipid Panel  Component Value Date/Time   CHOL 175 08/27/2014 0352   TRIG 146 08/27/2014 0352   HDL 52 08/27/2014 0352   CHOLHDL 3.4 08/27/2014 0352   VLDL 29 08/27/2014 0352   LDLCALC 94 08/27/2014 0352    BNP (last 3 results)  Recent Labs  08/20/15 1154  BNP 175.4*    HEMOGLOBIN A1C No results found for: HGBA1C, MPG  Cardiac Panel (last 3 results)  Recent Labs  03/08/16 0331 03/08/16 0607  TROPONINI <0.03 <0.03    Lab Results  Component Value Date   TROPONINI <0.03 03/08/2016     TSH  Recent Labs  08/20/15 1155  TSH 3.020    EKG 03/07/2016: sinus rhythm at a rate of 92 bpm, LAE, LBBB.  Echo 12/11/2014:  1. Left ventricle cavity is normal in size. Mild concentric hypertrophy of the left ventricle. Normal global wall motion. Doppler evidence of grade II (pseudonormal) diastolic dysfunction. Calculated EF 63%. 2. Trace mitral regurgitation. Mild calcification of the mitral valve annulus. 3. Trace tricuspid regurgitation. IVC is normal with blunted respiratory response.  Coronary angiogram 08/27/2014: Normal coronary arteries. Severe tortuosity, suggests hypertensive heart disease. Normal LVEF.    Radiology: Dg Chest Port 1 View  03/08/2016  CLINICAL DATA:  75 year old female with chest pain EXAM: PORTABLE CHEST 1 VIEW COMPARISON:  Chest radiograph dated 09/16/2015 FINDINGS: Single portable view of the chest demonstrate shallow inspiration. There is no focal consolidation, pleural effusion, or pneumothorax. The cardiac silhouette is within normal limits. Left pectoral pacemaker device. There is degenerative changes of the shoulders and AC noted no acute fracture. IMPRESSION: No active disease. Electronically Signed   By: Anner Crete M.D.   On: 03/08/2016 00:22    Scheduled Meds: . allopurinol  100 mg Oral q morning - 10a  . amLODipine  5 mg Oral Daily  . enoxaparin (LOVENOX) injection  40 mg Subcutaneous Q24H  . ferrous sulfate  325 mg Oral BID WC  . insulin aspart  0-9 Units Subcutaneous TID WC  . magnesium oxide  400 mg Oral Daily  . vitamin C  500 mg Oral Daily   Continuous Infusions:  PRN Meds:.acetaminophen, gi cocktail, meclizine, morphine injection, nitroGLYCERIN, ondansetron (ZOFRAN) IV  ASSESSMENT AND PLAN:  1. Chest pain (Coronary angiogram 08/27/2014: Normal coronary arteries. Severe tortuosity, suggests hypertensive heart disease. Normal LVEF) 2. Shortness of breath 3. LBBB 4. Essential Hypertension 5. HLD 6. Type II DM 7. PAD (Lower extremity arterial duplex 11/22/2014: Right ABI: 0.93 (previously 0.99).  Left ABI 0.89 (previously 0.99).  Bilateral femoral and popliteal: Triphasic, bilateral posterior tibial: Biphasic, bilateral dorsalis pedis: Monophasic.) 8. Pacemaker in situ (STJ dual chamber PPM on 08/20/15 by Dr Lovena Le. (serial number [id number]) pacemaker with [employer] Jude 2088) 9. Atrial tachycardia by pacemaker interrogation.   Recommendation: Suspect symptoms were related to recurrent atrial tachycardia. Doubt ACS, coronary angiogram in 2015 revealed tortuous coronary arteries but were otherwise normal. Will likely resume beta blocker as recommended by EP.  Further recommendation pending evaluation by Dr. Einar Gip.   Rachel Bo, NP-C 03/08/2016, 8:17 AM [facility] [place] Pager: [phone] Office: [phone]

## 2016-03-08 NOTE — ED Notes (Signed)
Paged [place] to [employer]

## 2016-03-08 NOTE — Progress Notes (Signed)
Pt arrived to unit with Nurse tech and daughter. She is alert and orient and able to walk to bed from stretcher. Vitals are stable. Call bell within reach. Pt resting comfortably.

## 2016-03-19 ENCOUNTER — Encounter: Payer: Self-pay | Admitting: Cardiology

## 2016-04-20 ENCOUNTER — Other Ambulatory Visit: Payer: Self-pay | Admitting: Internal Medicine

## 2016-04-20 DIAGNOSIS — M79605 Pain in left leg: Secondary | ICD-10-CM

## 2016-04-20 DIAGNOSIS — M79604 Pain in right leg: Secondary | ICD-10-CM

## 2016-04-29 ENCOUNTER — Ambulatory Visit
Admission: RE | Admit: 2016-04-29 | Discharge: 2016-04-29 | Disposition: A | Discharge status: Home / Self Care | Payer: Medicare Other | Source: Ambulatory Visit | Attending: Internal Medicine | Admitting: Internal Medicine

## 2016-04-29 DIAGNOSIS — M79604 Pain in right leg: Secondary | ICD-10-CM

## 2016-04-29 DIAGNOSIS — M79605 Pain in left leg: Secondary | ICD-10-CM

## 2016-05-31 ENCOUNTER — Ambulatory Visit (INDEPENDENT_AMBULATORY_CARE_PROVIDER_SITE_OTHER): Payer: Medicare Other | Admitting: *Deleted

## 2016-05-31 DIAGNOSIS — I442 Atrioventricular block, complete: Secondary | ICD-10-CM | POA: Diagnosis not present

## 2016-05-31 NOTE — Progress Notes (Signed)
Remote pacemaker transmission.   

## 2016-06-02 ENCOUNTER — Encounter: Payer: Self-pay | Admitting: Cardiology

## 2016-06-08 LAB — CUP PACEART REMOTE DEVICE CHECK
Brady Statistic RV Percent Paced: 99 %
Implantable Lead Implant Date: 20161221
Implantable Lead Location: 753860
Implantable Lead Location: 753860
Lead Channel Impedance Value: 710 Ohm
MDC IDC LEAD IMPLANT DT: 20161221
MDC IDC MSMT LEADCHNL RA IMPEDANCE VALUE: 400 Ohm
MDC IDC MSMT LEADCHNL RA PACING THRESHOLD AMPLITUDE: 0.625 V
MDC IDC MSMT LEADCHNL RA PACING THRESHOLD PULSEWIDTH: 0.5 ms
MDC IDC MSMT LEADCHNL RA SENSING INTR AMPL: 2.3 mV
MDC IDC MSMT LEADCHNL RV PACING THRESHOLD AMPLITUDE: 0.5 V
MDC IDC MSMT LEADCHNL RV PACING THRESHOLD PULSEWIDTH: 0.5 ms
MDC IDC SESS DTM: 20171010132913
MDC IDC STAT BRADY RA PERCENT PACED: 1.9 %
Pulse Gen Model: 2240
Pulse Gen Serial Number: 7839181

## 2016-06-16 ENCOUNTER — Encounter: Payer: Self-pay | Admitting: Cardiology

## 2016-08-17 ENCOUNTER — Encounter: Payer: Medicare Other | Admitting: Cardiology

## 2016-10-01 ENCOUNTER — Ambulatory Visit (INDEPENDENT_AMBULATORY_CARE_PROVIDER_SITE_OTHER): Payer: Medicare Other | Admitting: Family Medicine

## 2016-10-01 ENCOUNTER — Encounter: Payer: Self-pay | Admitting: Family Medicine

## 2016-10-01 ENCOUNTER — Telehealth: Payer: Self-pay | Admitting: Family Medicine

## 2016-10-01 ENCOUNTER — Ambulatory Visit (INDEPENDENT_AMBULATORY_CARE_PROVIDER_SITE_OTHER): Payer: Medicare Other

## 2016-10-01 VITALS — BP 136/78 | HR 67 | Temp 97.8°F | Ht 63.0 in | Wt 222.2 lb

## 2016-10-01 DIAGNOSIS — E1169 Type 2 diabetes mellitus with other specified complication: Secondary | ICD-10-CM | POA: Diagnosis not present

## 2016-10-01 DIAGNOSIS — I442 Atrioventricular block, complete: Secondary | ICD-10-CM

## 2016-10-01 DIAGNOSIS — M5441 Lumbago with sciatica, right side: Secondary | ICD-10-CM

## 2016-10-01 DIAGNOSIS — I209 Angina pectoris, unspecified: Secondary | ICD-10-CM | POA: Diagnosis not present

## 2016-10-01 DIAGNOSIS — R202 Paresthesia of skin: Secondary | ICD-10-CM

## 2016-10-01 DIAGNOSIS — M5442 Lumbago with sciatica, left side: Secondary | ICD-10-CM

## 2016-10-01 DIAGNOSIS — R42 Dizziness and giddiness: Secondary | ICD-10-CM

## 2016-10-01 DIAGNOSIS — E669 Obesity, unspecified: Secondary | ICD-10-CM | POA: Diagnosis not present

## 2016-10-01 DIAGNOSIS — E785 Hyperlipidemia, unspecified: Secondary | ICD-10-CM | POA: Diagnosis not present

## 2016-10-01 DIAGNOSIS — D509 Iron deficiency anemia, unspecified: Secondary | ICD-10-CM | POA: Diagnosis not present

## 2016-10-01 DIAGNOSIS — L4050 Arthropathic psoriasis, unspecified: Secondary | ICD-10-CM

## 2016-10-01 DIAGNOSIS — I10 Essential (primary) hypertension: Secondary | ICD-10-CM

## 2016-10-01 DIAGNOSIS — G8929 Other chronic pain: Secondary | ICD-10-CM

## 2016-10-01 DIAGNOSIS — IMO0001 Reserved for inherently not codable concepts without codable children: Secondary | ICD-10-CM

## 2016-10-01 LAB — VITAMIN B12: Vitamin B-12: 1008 pg/mL — ABNORMAL HIGH (ref 211–911)

## 2016-10-01 MED ORDER — HYDROCODONE-ACETAMINOPHEN 5-325 MG PO TABS: 1.0000 | ORAL_TABLET | Freq: Four times a day (QID) | ORAL | 0 refills | Status: DC | PRN

## 2016-10-01 NOTE — Telephone Encounter (Signed)
[name] called and stated the issue her mother had was called "Left bundle branch" and wanted to let Dr. Juleen China know.

## 2016-10-02 DIAGNOSIS — E785 Hyperlipidemia, unspecified: Secondary | ICD-10-CM | POA: Insufficient documentation

## 2016-10-02 DIAGNOSIS — R202 Paresthesia of skin: Secondary | ICD-10-CM | POA: Insufficient documentation

## 2016-10-02 DIAGNOSIS — G8929 Other chronic pain: Secondary | ICD-10-CM | POA: Insufficient documentation

## 2016-10-02 DIAGNOSIS — M5442 Lumbago with sciatica, left side: Secondary | ICD-10-CM

## 2016-10-02 DIAGNOSIS — M5441 Lumbago with sciatica, right side: Secondary | ICD-10-CM

## 2016-10-02 DIAGNOSIS — Z6835 Body mass index (BMI) 35.0-35.9, adult: Secondary | ICD-10-CM | POA: Insufficient documentation

## 2016-10-02 DIAGNOSIS — E1169 Type 2 diabetes mellitus with other specified complication: Secondary | ICD-10-CM | POA: Insufficient documentation

## 2016-10-02 NOTE — Progress Notes (Signed)
Krista Clarke is a 76 y.o. female is here to [facility].   History of Present Illness:    1. Paresthesia of both lower extremities: Worsening. Bilateral. Numbness and tingling. Some weakness. She has tried Lyrica, Cymbalta, PT. Hx of LBP - previous workup and treatment at [facility]. Previous MRI and epidural injections. Was told "nothing more to do." Hx of OP, DM, psoriatic arthritis.    2. Dizziness: Ongoing. Has been told that she has Vertigo. Rx Meclizine. Taking almost daily. Worse with head forward position. Feels swimmy headed, but also that she may pass out. Lyking down makes her feels better. Taking Meclizine TID on some days. Takes Meclizine anytime she goes to exercise. Using a cane or walker for balance. One mechanical fall without injury in the past year. No other complaints of weakness, paresthesias, focal weakness, trouble speaking or swallowing, or other red flags.    3. Chronic midline low back pain with bilateral sciatica   4. Obesity, Class II, BMI 35-39.9, with comorbidity   5. Hyperlipidemia, unspecified hyperlipidemia type   6. 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 Diabetes mellitus type 2 in obese ([place])    8. Iron deficiency anemia, unspecified iron deficiency anemia type: Review of old records. Referred to GI for worsening anemia.    9. Essential hypertension:   10. Angina pectoris ([place])   11. Complete heart block [facility])    Health Maintenance Due  Topic Date Due  . OPHTHALMOLOGY EXAM  08/30/1949  . TETANUS/TDAP  08/30/1958  . ZOSTAVAX  08/31/1999  . PNA vac Low Risk Adult (1 of 2 - PCV13) 08/30/2004    PMHx, SurgHx, SocialHx, Medications, and Allergies were reviewed in the Visit Navigator and updated as appropriate.    Past Medical History:  Diagnosis Date  . Anemia    takes iron daily  . Arthritis   . Diabetes mellitus without complication ([place])   . Hypercholesteremia   . Hypertension     Past Surgical History:  Procedure Laterality Date  .  ABDOMINAL HYSTERECTOMY    . COLONOSCOPY WITH PROPOFOL N/A 07/03/2013   Procedure: COLONOSCOPY WITH PROPOFOL;  Surgeon: Garlan Fair, MD;  Location: [facility] ENDOSCOPY;  Service: Endoscopy;  Laterality: N/A;  . EP IMPLANTABLE DEVICE N/A 08/20/2015   Procedure: Pacemaker Implant;  Surgeon: Evans Lance, MD;  Location: [place] CV LAB;  Service: Cardiovascular;  Laterality: N/A;  . JOINT REPLACEMENT Bilateral    '99  . LEFT HEART CATHETERIZATION WITH CORONARY ANGIOGRAM N/A 08/27/2014   Procedure: LEFT HEART CATHETERIZATION WITH CORONARY ANGIOGRAM;  Surgeon: Laverda Page, MD;  Location: [facility] CATH LAB;  Service: Cardiovascular;  Laterality: N/A;  . TUBAL LIGATION      Family History  Problem Relation Age of Onset  . Hypertension Father   . Hypertension Mother   . Diabetes Mother   . Cancer Brother     Social History  Substance Use Topics  . Smoking status: Never Smoker  . Smokeless tobacco: Never Used  . Alcohol use No     Current Medications and Allergies:    Current Outpatient Prescriptions:  .  acetaminophen (TYLENOL) 500 MG tablet, Take 1,000 mg by mouth every 6 (six) hours as needed. Reported on 08/20/2015, Disp: , Rfl:  .  allopurinol (ZYLOPRIM) 100 MG tablet, Take 100 mg by mouth every morning. , Disp: , Rfl:  .  amLODipine (NORVASC) 5 MG tablet, Take 1 tablet (5 mg total) by mouth daily., Disp: 30 tablet, Rfl:  0 .  aspirin 81 MG chewable tablet, Chew by mouth daily., Disp: , Rfl:  .  atorvastatin (LIPITOR) 40 MG tablet, , Disp: , Rfl:  .  Calcium Citrate-Vitamin D (CALCIUM CITRATE + D PO), Take 1 tablet by mouth daily., Disp: , Rfl:  .  ferrous sulfate 325 (65 FE) MG EC tablet, Take 325 mg by mouth 2 (two) times daily., Disp: , Rfl:  .  hydrochlorothiazide (MICROZIDE) 12.5 MG capsule, 12.5 mg., Disp: , Rfl:  .  irbesartan-hydrochlorothiazide (AVALIDE) 150-12.5 MG per tablet, Take 1 tablet by mouth daily., Disp: , Rfl:  .  Lutein 20 MG TABS, Take by mouth., Disp: ,  Rfl:  .  magnesium oxide (MAG-OX) 400 MG tablet, Take 400 mg by mouth daily., Disp: , Rfl:  .  meclizine (ANTIVERT) 12.5 MG tablet, Take 12.5 mg by mouth 3 (three) times daily as needed for dizziness. , Disp: , Rfl:  .  metFORMIN (GLUCOPHAGE) 1000 MG tablet, Take 1,000 mg by mouth 2 (two) times daily with a meal., Disp: , Rfl:  .  metoprolol tartrate (LOPRESSOR) 25 MG tablet, Take 1 tablet (25 mg total) by mouth 2 (two) times daily., Disp: 60 tablet, Rfl: 3 .  nitroGLYCERIN (NITROSTAT) 0.4 MG SL tablet, Place 0.4 mg under the tongue every 5 (five) minutes as needed for chest pain., Disp: , Rfl:  .  pregabalin (LYRICA) 50 MG capsule, Take 50 mg by mouth daily., Disp: , Rfl:  .  vitamin C (ASCORBIC ACID) 500 MG tablet, Take 500 mg by mouth daily., Disp: , Rfl:     Allergies  Allergen Reactions  . Prednisone Palpitations and Other (See Comments)    Doesn't work well for patient      Patient Information Form: Screening and ROS    Do you feel safe in relationships? yes PHQ-2: negative  Review of Systems  General:  Negative for nexplained weight loss, fever Skin: Negative for new or changing mole, sore that won't heal HEENT: Negative for trouble hearing, trouble seeing, ringing in ears, mouth sores, hoarseness, change in voice, dysphagia CV:  Negative for chest pain, dyspnea, edema, palpitations Resp: Negative for cough, dyspnea, hemoptysis GI: Negative for nausea, vomiting, diarrhea, constipation, abdominal pain, melena, hematochezia GU: Negative for dysuria, incontinence, urinary hesitance, hematuria MSK: Negative for muscle cramps or aches, joint pain or swelling Neuro: Negative for headaches, weakness, passing out/fainting Psych: Negative for depression, anxiety, memory problems   Vitals:   Vitals:   10/01/16 1012  BP: 136/78  Pulse: 67  Temp: 97.8 F (36.6 C)  TempSrc: Oral  SpO2: 98%  Weight: 222 lb 3.2 oz (100.8 kg)  Height: 5\' 3"  (1.6 m)     Body mass index is  39.36 kg/m.   Physical Exam:     General: Alert, cooperative, appears stated age and no distress.  HEENT:  Normocephalic, without obvious abnormality, atraumatic. Conjunctivae/corneas clear. PERRL, EOM's intact. Normal TM's and external ear canals both ears. Nares normal. Septum midline. Mucosa normal. No drainage or sinus tenderness. Lips, mucosa, and tongue normal; teeth and gums normal.  Lungs: Clear to auscultation bilaterally.  Heart:: Regular rate and rhythm, S1, S2 normal, no murmur, click, rub or gallop.  Abdomen: Soft, non-tender; bowel sounds normal; no masses,  no organomegaly.  Extremities: Extremities normal, atraumatic, no cyanosis or edema.  Pulses: 2+ and symmetric.  Skin: Skin color, texture, turgor normal. No rashes or lesions.  Neurologic: Alert and oriented X 3, decreased LE strength but normal tone. Normal symmetric.  reflexes. Slow to rise from chair. Uses cane.   Psych: Alert,oriented, in NAD with a full range of affect, normal behavior and no psychotic features  Msk: TTP lumbar spinous process and facets. Decreased ROM due to pain. + SLR bilaterally.    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 The above study suggests mild right iliac arterial disease and mild left distal popliteal or tibial disease.   Assessment and Plan:    Jamonica was seen today for establish care and back pain.  Diagnoses and all orders for this visit:  Paresthesia of both lower extremities Comments: PN. DDx: associated with chronic back pain, claudication, DM. No acute findings on imaging today. Will review [facility] records, including previous MRI. Patient already on Lyrica. She has tried Cymbalta and PT without relief. Okay Norco as below to use sparingly, with fall precautions reviewed at length.  She usually uses a cane, but also  has a walker at home if needed. Consider vascular. Orders: -     DG Lumbar Spine 2-3 Views - degenerative changes. -     B12  Dizziness Comments: Unclear Hx. Taking Meclizine TID on some days. Hx is not solid for vertigo. Neurology referral.  Orders: -     Ambulatory referral to Neurology  Chronic midline low back pain with bilateral sciatica Comments: Per patient's daugter, previous MRI and epidural injections. Raliegh Ip. "Nothing to do."  Orders: -     HYDROcodone-acetaminophen (NORCO/VICODIN) 5-325 MG tablet; Take 1 tablet by mouth every 6 (six) hours as needed for moderate pain.  Obesity, Class II, BMI 35-39.9, with comorbidity  Hyperlipidemia, unspecified hyperlipidemia type Comments: Stable per labs brought in today. Will have scanned.  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  Iron deficiency anemia, unspecified iron deficiency anemia type Comments: Worsening per last PCP note. Will continue with previous plan of GI referral.   Essential hypertension  Angina pectoris ([place])  Complete heart block ([place])   . Reviewed expectations re: course of current medical issues. . Discussed self-management of symptoms. . Outlined signs and symptoms indicating need for more acute intervention. . Patient verbalized understanding and all questions were answered. . See orders for this visit as documented in the electronic medical record. . Patient received an After Visit Summary.  Records requested if needed. I spent 60 minutes with this patient, greater than 50% was face-to-face time counseling regarding the above diagnoses.    Briscoe Deutscher, [place], [place] 10/02/2016   Follow-up: No Follow-up on file.  Meds ordered this encounter  Medications  . atorvastatin (LIPITOR) 40 MG tablet  . hydrochlorothiazide (MICROZIDE) 12.5 MG capsule    Sig: 12.5 mg.  . aspirin 81 MG chewable tablet    Sig: Chew  by mouth daily.  . pregabalin (LYRICA) 50 MG capsule    Sig: Take 50 mg by mouth daily.  . Lutein 20 MG TABS    Sig: Take by mouth.  Marland Kitchen HYDROcodone-acetaminophen (NORCO/VICODIN) 5-325 MG tablet    Sig: Take 1 tablet by mouth every 6 (six) hours as needed for moderate pain.    Dispense:  20 tablet    Refill:  0   There are no discontinued medications. Orders Placed This Encounter  Procedures  . DG Lumbar Spine  2-3 Views  . B12  . Ambulatory referral to Neurology

## 2016-10-03 ENCOUNTER — Encounter: Payer: Self-pay | Admitting: Family Medicine

## 2016-10-03 DIAGNOSIS — L4050 Arthropathic psoriasis, unspecified: Secondary | ICD-10-CM | POA: Insufficient documentation

## 2016-10-03 MED ORDER — VORAPAXAR SULFATE 2.08 MG PO TABS: 1.0000 | ORAL_TABLET | Freq: Every day | ORAL | 0 refills | Status: DC

## 2016-10-19 ENCOUNTER — Telehealth: Payer: Self-pay | Admitting: Cardiology

## 2016-10-19 NOTE — Telephone Encounter (Signed)
New message    Pt daughter states that her blood pressure went up (pt daughter cannot remember what it went up to) and no one has called and she is worried that no one is monitoring her pacemaker or that maybe it is not working properly and notifying us when it should.   1. Has your device fired? no  2. Is you device beeping? no  3. Are you experiencing draining or swelling at device site? no  4. Are you calling to see if we received your device transmission? YES  5. Have you passed out? *no

## 2016-10-19 NOTE — Telephone Encounter (Signed)
Informed daughter that patient's ppm does not monitor her BP. I told her that if patient were to have a faster HR then it would record that. Daughter asked if patient's device information was released to [facility]. I told her that it has been released and that Dr.Ganji's office needs to enroll her in their clinic. She said that they're supposed to contact her tomorrow. I told her that when they do to make sure that she tells them to enroll her mother in [place]. Daughter voiced understanding.

## 2016-11-18 ENCOUNTER — Encounter: Payer: Self-pay | Admitting: Family Medicine

## 2016-11-18 DIAGNOSIS — Z950 Presence of cardiac pacemaker: Secondary | ICD-10-CM

## 2016-11-18 DIAGNOSIS — I739 Peripheral vascular disease, unspecified: Secondary | ICD-10-CM | POA: Insufficient documentation

## 2016-11-18 DIAGNOSIS — I447 Left bundle-branch block, unspecified: Secondary | ICD-10-CM | POA: Insufficient documentation

## 2016-11-19 ENCOUNTER — Encounter: Payer: Self-pay | Admitting: Family Medicine

## 2016-11-19 ENCOUNTER — Ambulatory Visit (HOSPITAL_COMMUNITY)
Admission: RE | Admit: 2016-11-19 | Discharge: 2016-11-19 | Disposition: A | Discharge status: Home / Self Care | Payer: Medicare Other | Source: Ambulatory Visit | Attending: Family Medicine | Admitting: Family Medicine

## 2016-11-19 ENCOUNTER — Ambulatory Visit (INDEPENDENT_AMBULATORY_CARE_PROVIDER_SITE_OTHER): Payer: Medicare Other | Admitting: Family Medicine

## 2016-11-19 VITALS — BP 158/84 | HR 71 | Temp 98.2°F | Resp 15 | Wt 220.6 lb

## 2016-11-19 DIAGNOSIS — M7989 Other specified soft tissue disorders: Secondary | ICD-10-CM | POA: Diagnosis not present

## 2016-11-19 DIAGNOSIS — M5442 Lumbago with sciatica, left side: Secondary | ICD-10-CM

## 2016-11-19 DIAGNOSIS — L03115 Cellulitis of right lower limb: Secondary | ICD-10-CM | POA: Diagnosis not present

## 2016-11-19 DIAGNOSIS — M5441 Lumbago with sciatica, right side: Secondary | ICD-10-CM | POA: Diagnosis not present

## 2016-11-19 DIAGNOSIS — G8929 Other chronic pain: Secondary | ICD-10-CM

## 2016-11-19 DIAGNOSIS — M79604 Pain in right leg: Secondary | ICD-10-CM

## 2016-11-19 MED ORDER — DOXYCYCLINE HYCLATE 100 MG PO CAPS: 100.0000 mg | ORAL_CAPSULE | Freq: Two times a day (BID) | ORAL | 0 refills | Status: DC

## 2016-11-19 MED ORDER — HYDROCODONE-ACETAMINOPHEN 5-325 MG PO TABS: 1.0000 | ORAL_TABLET | Freq: Four times a day (QID) | ORAL | 0 refills | Status: DC | PRN

## 2016-11-19 NOTE — Progress Notes (Signed)
Krista Clarke is a 76 y.o. female here for worsening right leg pain and swelling. She is accompanied by a family member. She has chronic leg pain that has gotten progressively worse over the last 2 days. Pain and swelling below knee, "all over," constant. Skin is sensitive and pain is stabbing. No worsening of numbness or tingling. Also has an area of pain and tenderness on lower right leg above her ankle. Noticed today. No recent falls or known injury. Was ambulating with a cane, but has had to use a walker recently due to increased pain. She has been taking Norco 5-325 every 6 hours without much relief. Requests refill of Norco today, only has 2 remaining from previous prescription. Has been taking Lyrica 50 mg po qhs, felt that it was causing blurry vision. Has since stopped Lyrica with improvement of blurred vision. She has known PVD and is taking Vorapaxar. No history of cellulitis or DVT.   History of Present Illness:   Chief Complaint  Patient presents with  . Leg Pain    Right knee and leg pain X 2 days. Some swelling in LE    HPI  PMHx, SurgHx, SocialHx, Medications, and Allergies were reviewed in the Visit Navigator and updated as appropriate.  Current Medications:   Current Outpatient Prescriptions:  .  acetaminophen (TYLENOL) 500 MG tablet, Take 1,000 mg by mouth every 6 (six) hours as needed. Reported on 08/20/2015, Disp: , Rfl:  .  allopurinol (ZYLOPRIM) 100 MG tablet, Take 100 mg by mouth every morning. , Disp: , Rfl:  .  amLODipine (NORVASC) 5 MG tablet, Take 1 tablet (5 mg total) by mouth daily., Disp: 30 tablet, Rfl: 0 .  aspirin 81 MG chewable tablet, Chew by mouth daily., Disp: , Rfl:  .  atorvastatin (LIPITOR) 40 MG tablet, , Disp: , Rfl:  .  Calcium Citrate-Vitamin D (CALCIUM CITRATE + D PO), Take 1 tablet by mouth daily., Disp: , Rfl:  .  ferrous sulfate 325 (65 FE) MG EC tablet, Take 325 mg by mouth 2 (two) times daily., Disp: , Rfl:  .  hydrochlorothiazide (MICROZIDE)  12.5 MG capsule, 12.5 mg., Disp: , Rfl:  .  HYDROcodone-acetaminophen (NORCO/VICODIN) 5-325 MG tablet, Take 1 tablet by mouth every 6 (six) hours as needed for moderate pain., Disp: 20 tablet, Rfl: 0 .  irbesartan-hydrochlorothiazide (AVALIDE) 150-12.5 MG per tablet, Take 1 tablet by mouth daily., Disp: , Rfl:  .  Lutein 20 MG TABS, Take by mouth., Disp: , Rfl:  .  magnesium oxide (MAG-OX) 400 MG tablet, Take 400 mg by mouth daily., Disp: , Rfl:  .  meclizine (ANTIVERT) 12.5 MG tablet, Take 12.5 mg by mouth 3 (three) times daily as needed for dizziness. , Disp: , Rfl:  .  metFORMIN (GLUCOPHAGE) 1000 MG tablet, Take 1,000 mg by mouth 2 (two) times daily with a meal., Disp: , Rfl:  .  metoprolol tartrate (LOPRESSOR) 25 MG tablet, Take 1 tablet (25 mg total) by mouth 2 (two) times daily., Disp: 60 tablet, Rfl: 3 .  nitroGLYCERIN (NITROSTAT) 0.4 MG SL tablet, Place 0.4 mg under the tongue every 5 (five) minutes as needed for chest pain., Disp: , Rfl:  .  pregabalin (LYRICA) 50 MG capsule, Take 50 mg by mouth daily., Disp: , Rfl:  .  vitamin C (ASCORBIC ACID) 500 MG tablet, Take 500 mg by mouth daily., Disp: , Rfl:  .  Vorapaxar Sulfate (ZONTIVITY) 2.08 MG TABS, Take 1 tablet by mouth daily., Disp: 30  tablet, Rfl: 0   Review of Systems:   Review of Systems  Constitutional: Negative for fever.  HENT: Negative for congestion, sinus pain and sore throat.   Eyes: Negative for blurred vision and double vision.  Respiratory: Negative for cough and wheezing.   Cardiovascular: Positive for leg swelling. Negative for chest pain and palpitations.       Right leg   Gastrointestinal: Negative for abdominal pain, heartburn, nausea and vomiting.  Genitourinary: Negative for dysuria.  Musculoskeletal: Positive for joint pain.       Right leg and knee  Neurological: Negative for dizziness, weakness and headaches.  Psychiatric/Behavioral: Negative for depression and memory loss.    Vitals:   Vitals:    11/19/16 1140  BP: (!) 158/84  Pulse: 71  Resp: 15  Temp: 98.2 F (36.8 C)  TempSrc: Oral  SpO2: 98%  Weight: 220 lb 9.6 oz (100.1 kg)     Body mass index is 39.08 kg/m.  Physical Exam:   Physical Exam  Constitutional: She is oriented to person, place, and time. She appears well-developed and well-nourished. No distress.  Obese.   HENT:  Head: Normocephalic and atraumatic.  Cardiovascular: Normal rate.   Pulmonary/Chest: Effort normal.  Musculoskeletal:  Right knee with well healed midline, vertical scar. Area below knee appears swollen compared to left knee, very tender to palpation, worse on medial aspect. No erythema or warmth.   Neurological: She is alert and oriented to person, place, and time.  Skin: Skin is warm and dry. She is not diaphoretic.  Right lateral aspect above ankle with 3-4 cm area of dimpling, slightly warm, small scabbed area off center. No drainage.     Assessment and Plan:  1. Right leg swelling - Need to rule out DVT in patient with multiple risk factors including known PVD, obesity, decreased mobility, age - VAS [place] LOWER EXTREMITY VENOUS (DVT); Future  2. Right leg pain - VAS [place] LOWER EXTREMITY VENOUS (DVT); Future - HYDROcodone-acetaminophen (NORCO/VICODIN) 5-325 MG tablet; Take 1 tablet by mouth every 6 (six) hours as needed for moderate pain.  Dispense: 20 tablet; Refill: 0  3. Cellulitis of right lower extremity - Provided written and verbal information regarding diagnosis and treatment. - RTC precautions reviewed - elevate leg as much as possilbe - doxycycline (VIBRAMYCIN) 100 MG capsule; Take 1 capsule (100 mg total) by mouth 2 (two) times daily.  Dispense: 14 capsule; Refill: 0  4. Chronic midline low back pain with bilateral sciatica - HYDROcodone-acetaminophen (NORCO/VICODIN) 5-325 MG tablet; Take 1 tablet by mouth every 6 (six) hours as needed for moderate pain.  Dispense: 20 tablet; Refill: 0   Clarene Reamer, FNP-BC  [place]  11/19/2016 3:16 PM

## 2016-11-19 NOTE — Progress Notes (Signed)
Pre visit review using our clinic review tool, if applicable. No additional management support is needed unless otherwise documented below in the visit note. 

## 2016-11-19 NOTE — Progress Notes (Signed)
*  Preliminary Results* Right lower extremity venous duplex completed. Right lower extremity is negative for deep vein thrombosis. There is no evidence of right Baker's cyst.  11/19/2016 3:32 PM  Maudry Mayhew, BS, RVT, RDCS, RDMS

## 2016-11-19 NOTE — Patient Instructions (Signed)
Cellulitis, Adult Cellulitis is a skin infection. The infected area is usually red and sore. This condition occurs most often in the arms and lower legs. It is very important to get treated for this condition. Follow these instructions at home:  Take over-the-counter and prescription medicines only as told by your doctor.  If you were prescribed an antibiotic medicine, take it as told by your doctor. Do not stop taking the antibiotic even if you start to feel better.  Drink enough fluid to keep your pee (urine) clear or pale yellow.  Do not touch or rub the infected area.  Raise (elevate) the infected area above the level of your heart while you are sitting or lying down.  Place warm or cold wet cloths (warm or cold compresses) on the infected area. Do this as told by your doctor.  Keep all follow-up visits as told by your doctor. This is important. These visits let your doctor make sure your infection is not getting worse. Contact a doctor if:  You have a fever.  Your symptoms do not get better after 1-2 days of treatment.  Your bone or joint under the infected area starts to hurt after the skin has healed.  Your infection comes back. This can happen in the same area or another area.  You have a swollen bump in the infected area.  You have new symptoms.  You feel ill and also have muscle aches and pains. Get help right away if:  Your symptoms get worse.  You feel very sleepy.  You throw up (vomit) or have watery poop (diarrhea) for a long time.  There are red streaks coming from the infected area.  Your red area gets larger.  Your red area turns darker. This information is not intended to replace advice given to you by your health care provider. Make sure you discuss any questions you have with your health care provider. Document Released: 02/02/2008 Document Revised: 01/22/2016 Document Reviewed: 06/25/2015 Elsevier Interactive Patient Education  2017 Elsevier  Inc.  

## 2016-11-20 NOTE — Progress Notes (Signed)
Thanks for seeing her. You ordered an [place] - what happened to that?

## 2016-11-20 NOTE — Progress Notes (Signed)
Oops! Found the [place] result. Never mind. Thanks! Krista Clarke

## 2016-11-24 ENCOUNTER — Telehealth: Payer: Self-pay | Admitting: Surgical

## 2016-11-24 NOTE — Telephone Encounter (Signed)
-----   Message from Elby Beck, [place] sent at 11/22/2016  5:22 PM EDT ----- Will you please call and check on patient from last Friday's visit? If lower leg not significantly better on antibiotic, please schedule her for a recheck with Dr. Juleen China this week.  Thanks,  [employer]

## 2016-11-24 NOTE — Telephone Encounter (Signed)
I spoke with the patient and she stated that her leg is doing much better. She stated that it is still a little tender to touch. I advised if she had any more problems to let us know.

## 2016-12-06 ENCOUNTER — Ambulatory Visit (INDEPENDENT_AMBULATORY_CARE_PROVIDER_SITE_OTHER): Payer: Medicare Other | Admitting: Neurology

## 2016-12-06 ENCOUNTER — Encounter: Payer: Self-pay | Admitting: Neurology

## 2016-12-06 VITALS — BP 126/72 | HR 73 | Ht 63.0 in | Wt 218.4 lb

## 2016-12-06 DIAGNOSIS — I10 Essential (primary) hypertension: Secondary | ICD-10-CM | POA: Diagnosis not present

## 2016-12-06 DIAGNOSIS — M48062 Spinal stenosis, lumbar region with neurogenic claudication: Secondary | ICD-10-CM | POA: Diagnosis not present

## 2016-12-06 DIAGNOSIS — R42 Dizziness and giddiness: Secondary | ICD-10-CM

## 2016-12-06 DIAGNOSIS — I739 Peripheral vascular disease, unspecified: Secondary | ICD-10-CM | POA: Diagnosis not present

## 2016-12-06 MED ORDER — GABAPENTIN 100 MG PO CAPS: ORAL_CAPSULE | ORAL | 0 refills | Status: DC

## 2016-12-06 NOTE — Patient Instructions (Signed)
1. The burning and numbness in the legs are likely due to pinched nerves in the back, arterial disease in the legs and possibly neuropathy from diabetes.  For pain, I will start you on gabapentin 100mg  capsules:  Take 1 capsule at bedtime for 7 days  Then 1 capsule twice daily for 7 days  Then 1 capsule three times daily  Caution for dizziness or drowsiness.  If tolerated, there is a lot of room to increase the dose.  Further refills can be prescribed by Dr. Juleen China.  2.  I do not think anything neurologic is causing the dizziness.  Just be careful when standing up and do not bend over while standing.

## 2016-12-06 NOTE — Progress Notes (Signed)
NEUROLOGY CONSULTATION NOTE  Krista Clarke MRN: [id number] DOB: 05/28/1941  Referring provider: Dr. Juleen China Primary care provider: Dr. Juleen China  Reason for consult:  Dizziness, pain in lower extremities  HISTORY OF PRESENT ILLNESS: Krista Clarke is a 76 year old right-handed female with type 2 diabetes mellitus with neuropathy, hypertension, psoriatic arthritis, hyperlipidemia, complete heart block status post pacemaker, and iron-deficiency anemia who presents for dizziness and pain and numbness in the lower extremities.  She is accompanied by her daughter who supplements history.  History is also supplemented by PCP note.  Dizziness:  She has had dizziness for about 6 weeks.  She describes it as a sensation that she will pass out.  It is not a spinning sensation like her typical vertigo.  Sometimes there is slight nausea.  There is no associated visual disturbance, slurred speech, facial droop or unilateral numbness or weakness.  It usually occurs when she bends over and sometimes when standing up.  She does not experience it when she is already standing.    Lower extremity Pain and Numbness:  She has history of lower extremity pain and numbness which has been worse over the past year.  The pain is a burning sensation.  She cannot feel the bottom of her feet and cannot feel the pedal when she drives.  She has chronic back pain.  She recently has had swelling in the legs.  She was recently diagnosed with cellulitis.  Recent venous doppler was negative for DVT.  ABI from 04/29/16 demonstrated mild arterial occlusive disease.  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  She received epidural injections at the time.  She did not tolerate Lyrica.  She had a CT of the head on 09/18/13, which was personally reviewed and revealed mild cerebral atrophy but no acute findings.  Labs 10/01/16:  B12 1,008  PAST MEDICAL HISTORY: Past  Medical History:  Diagnosis Date  . Anemia    takes iron daily  . Arthritis   . Diabetes mellitus without complication ([place])   . Hypercholesteremia   . Hypertension     PAST SURGICAL HISTORY: Past Surgical History:  Procedure Laterality Date  . ABDOMINAL HYSTERECTOMY    . COLONOSCOPY WITH PROPOFOL N/A 07/03/2013   Procedure: COLONOSCOPY WITH PROPOFOL;  Surgeon: Garlan Fair, MD;  Location: [facility];  Service: Endoscopy;  Laterality: N/A;  . EP IMPLANTABLE DEVICE N/A 08/20/2015   Procedure: Pacemaker Implant;  Surgeon: Evans Lance, MD;  Location: [facility];  Service: Cardiovascular;  Laterality: N/A;  . JOINT REPLACEMENT Bilateral    '99  . LEFT HEART CATHETERIZATION WITH CORONARY ANGIOGRAM N/A 08/27/2014   Procedure: LEFT HEART CATHETERIZATION WITH CORONARY ANGIOGRAM;  Surgeon: Laverda Page, MD;  Location: [facility] CATH LAB;  Service: Cardiovascular;  Laterality: N/A;  . TUBAL LIGATION      MEDICATIONS: Current Outpatient Prescriptions on File Prior to Visit  Medication Sig Dispense Refill  . acetaminophen (TYLENOL) 500 MG tablet Take 1,000 mg by mouth every 6 (six) hours as needed. Reported on 08/20/2015    . allopurinol (ZYLOPRIM) 100 MG tablet Take 100 mg by mouth every morning.     Marland Kitchen amLODipine (NORVASC) 5 MG tablet Take 1 tablet (5 mg total) by mouth daily. 30 tablet 0  . aspirin 81 MG chewable tablet Chew by mouth daily.    Marland Kitchen atorvastatin (LIPITOR) 40 MG tablet     . Calcium Citrate-Vitamin D (  CALCIUM CITRATE + D PO) Take 1 tablet by mouth daily.    . ferrous sulfate 325 (65 FE) MG EC tablet Take 325 mg by mouth 2 (two) times daily.    . hydrochlorothiazide (MICROZIDE) 12.5 MG capsule 12.5 mg.    . HYDROcodone-acetaminophen (NORCO/VICODIN) 5-325 MG tablet Take 1 tablet by mouth every 6 (six) hours as needed for moderate pain. 20 tablet 0  . irbesartan-hydrochlorothiazide (AVALIDE) 150-12.5 MG per tablet Take 1 tablet by mouth daily.    . Lutein 20 MG TABS  Take by mouth.    . magnesium oxide (MAG-OX) 400 MG tablet Take 400 mg by mouth daily.    . meclizine (ANTIVERT) 12.5 MG tablet Take 12.5 mg by mouth 3 (three) times daily as needed for dizziness.     . metFORMIN (GLUCOPHAGE) 1000 MG tablet Take 1,000 mg by mouth 2 (two) times daily with a meal.    . metoprolol tartrate (LOPRESSOR) 25 MG tablet Take 1 tablet (25 mg total) by mouth 2 (two) times daily. 60 tablet 3  . vitamin C (ASCORBIC ACID) 500 MG tablet Take 500 mg by mouth daily.    . nitroGLYCERIN (NITROSTAT) 0.4 MG SL tablet Place 0.4 mg under the tongue every 5 (five) minutes as needed for chest pain.    . pregabalin (LYRICA) 50 MG capsule Take 50 mg by mouth daily.    . Vorapaxar Sulfate (ZONTIVITY) 2.08 MG TABS Take 1 tablet by mouth daily. (Patient not taking: Reported on 12/06/2016) 30 tablet 0   No current facility-administered medications on file prior to visit.     ALLERGIES: Allergies  Allergen Reactions  . Prednisone Palpitations and Other (See Comments)    Doesn't work well for patient    FAMILY HISTORY: Family History  Problem Relation Age of Onset  . Hypertension Mother   . Diabetes Mother   . Hypertension Father   . Cancer Brother     SOCIAL HISTORY: Social History   Social History  . Marital status: Widowed    Spouse name: N/A  . Number of children: N/A  . Years of education: N/A   Occupational History  . Retired     Social History Main Topics  . Smoking status: Never Smoker  . Smokeless tobacco: Never Used  . Alcohol use No  . Drug use: No  . Sexual activity: Not Currently   Other Topics Concern  . Not on file   Social History Narrative  . No narrative on file    REVIEW OF SYSTEMS: Constitutional: No fevers, chills, or sweats, no generalized fatigue, change in appetite Eyes: No visual changes, double vision, eye pain Ear, nose and throat: No hearing loss, ear pain, nasal congestion, sore throat Cardiovascular: No chest pain,  palpitations Respiratory:  No shortness of breath at rest or with exertion, wheezes GastrointestinaI: No nausea, vomiting, diarrhea, abdominal pain, fecal incontinence Genitourinary:  No dysuria, urinary retention or frequency Musculoskeletal:  back pain Integumentary: No rash, pruritus, skin lesions Neurological: as above Psychiatric: No depression, insomnia, anxiety Endocrine: No palpitations, fatigue, diaphoresis, mood swings, change in appetite, change in weight, increased thirst Hematologic/Lymphatic:  No purpura, petechiae. Allergic/Immunologic: no itchy/runny eyes, nasal congestion, recent allergic reactions, rashes  PHYSICAL EXAM: Vitals:   12/06/16 1438  BP: 126/72  Pulse: 73   General: No acute distress.  Patient appears well-groomed.  Head:  Normocephalic/atraumatic Eyes:  fundi examined but not visualized Neck: supple, no paraspinal tenderness, full range of motion Back: No paraspinal tenderness Heart: regular rate  and rhythm Lungs: Clear to auscultation bilaterally. Vascular: No carotid bruits. Neurological Exam: Mental status: alert and oriented to person, place, and time, recent and remote memory intact, fund of knowledge intact, attention and concentration intact, speech fluent and not dysarthric, language intact. Cranial nerves: CN I: not tested CN II: pupils equal, round and reactive to light, visual fields intact CN III, IV, VI:  full range of motion, no nystagmus, no ptosis CN V: facial sensation intact CN VII: upper and lower face symmetric CN VIII: hearing intact CN IX, X: gag intact, uvula midline CN XI: sternocleidomastoid and trapezius muscles intact CN XII: tongue midline Bulk & Tone: normal, no fasciculations. Motor:  5/5 throughout  Sensation: temperature sensation intact and vibration sensation reduced in feet. Deep Tendon Reflexes:  2+ throughout, except absent in ankles, toes downgoing.  Finger to nose testing:  Without dysmetria.  Heel to  shin:  Without dysmetria.   Gait:  Wide-based antalgic gait, needs to ambulate with cane.  Romberg not tested as patient already unsteady.  IMPRESSION: 1.  Dizziness.  Semiology not consistent with a primary neurologic etiology. 2.  Lower extremity pain, multifactorial, related to lumbar spinal stenosis, mild peripheral arterial disease and probably underlying diabetic neuropathy. 3.  Hypertension  PLAN: 1.  For pain, will prescribe gabapentin 100mg , titrating to 100mg  three times daily.  Further titration as needed and/or tolerated.  Further refills and dose adjustments may be managed by PCP. 2.  Advised to take care when standing up from sitting position and not to bend over when standing. 3.  If she cannot feel her feet when driving, advised that she should not drive. 4.  Follow up with PCP regarding blood pressure 5.  Follow up as needed.  Thank you for allowing me to take part in the care of this patient.  Krista Clines, DO  CC:  Briscoe Deutscher, DO

## 2016-12-10 ENCOUNTER — Encounter: Payer: Self-pay | Admitting: Family Medicine

## 2016-12-10 ENCOUNTER — Ambulatory Visit (INDEPENDENT_AMBULATORY_CARE_PROVIDER_SITE_OTHER): Payer: Medicare Other | Admitting: Family Medicine

## 2016-12-10 VITALS — BP 132/72 | HR 71 | Temp 98.1°F | Ht 63.0 in | Wt 220.2 lb

## 2016-12-10 DIAGNOSIS — R42 Dizziness and giddiness: Secondary | ICD-10-CM

## 2016-12-10 DIAGNOSIS — E669 Obesity, unspecified: Secondary | ICD-10-CM

## 2016-12-10 DIAGNOSIS — E1169 Type 2 diabetes mellitus with other specified complication: Secondary | ICD-10-CM

## 2016-12-10 LAB — POCT GLYCOSYLATED HEMOGLOBIN (HGB A1C): Hemoglobin A1C: 7.9

## 2016-12-10 NOTE — Progress Notes (Signed)
Pre visit review using our clinic review tool, if applicable. No additional management support is needed unless otherwise documented below in the visit note. 

## 2016-12-10 NOTE — Patient Instructions (Signed)
Please make an appointment with Cassie for an Annual Medicare Wellness Visit if appropriate.

## 2016-12-10 NOTE — Progress Notes (Signed)
Krista Clarke is a 76 y.o. female is here to discuss:  History of Present Illness:   Water quality scientist, CMA, acting as scribe for Dr. Juleen China.  Chief Complaint  Patient presents with  . Follow-up   HPI:  1. Diabetes mellitus type 2 in obese ([place]). Current symptoms: No polyuria, polydipsia, blurry vision, chest pain, dyspnea or claudication.  No foot burning, numbness or pain. Taking medication compliantly without noted sided effects. Home glucose monitoring in the range of 100-200. No episodes of hypoglycemia. On ACE inhibitor or angiotensin II receptor blocker?  Yes. On Aspirin? Yes.   2. Dizziness. IMPROVING. Saw Neurology. Stopped Meclizine and Lyrica. Taking Neurontin 100 mg po qhs only.    Health Maintenance Due  Topic Date Due  . FOOT EXAM  08/30/1949  . OPHTHALMOLOGY EXAM  08/30/1949  . TETANUS/TDAP  08/30/1958  . PNA vac Low Risk Adult (1 of 2 - PCV13) 08/30/2004   PMHx, SurgHx, SocialHx, FamHx, Medications, and Allergies were reviewed in the Visit Navigator and updated as appropriate.   Patient Active Problem List   Diagnosis Date Noted  . LBBB (left bundle branch block) 11/18/2016  . Pacemaker 11/18/2016  . PAD (peripheral artery disease) ([name]) 11/18/2016  . 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[name]) 10/03/2016  . Chronic midline low back pain with bilateral sciatica 10/02/2016  . Paresthesia of both lower extremities 10/02/2016  . Obesity, Class II, BMI 35-39.9, with comorbidity 10/02/2016  . HLD (hyperlipidemia) 10/02/2016  . Essential hypertension 03/08/2016  . Anemia, iron deficiency 03/08/2016  . Diabetes mellitus type 2 in obese ([name]) 03/08/2016  . Complete heart block ([place]) 08/20/2015  . Angina pectoris ([name]) 08/26/2014   Social History  Substance Use Topics  . Smoking status: Never Smoker  . Smokeless tobacco: Never Used  . Alcohol use No   Current Medications and Allergies:   Current Outpatient Prescriptions:  .  acetaminophen (TYLENOL) 500 MG tablet, Take 1,000  mg by mouth every 6 (six) hours as needed. Reported on 08/20/2015, Disp: , Rfl:  .  allopurinol (ZYLOPRIM) 100 MG tablet, Take 100 mg by mouth every morning. , Disp: , Rfl:  .  amLODipine (NORVASC) 5 MG tablet, Take 1 tablet (5 mg total) by mouth daily., Disp: 30 tablet, Rfl: 0 .  aspirin 81 MG chewable tablet, Chew by mouth daily., Disp: , Rfl:  .  atorvastatin (LIPITOR) 40 MG tablet, , Disp: , Rfl:  .  Calcium Citrate-Vitamin D (CALCIUM CITRATE + D PO), Take 1 tablet by mouth daily., Disp: , Rfl:  .  ferrous sulfate 325 (65 FE) MG EC tablet, Take 325 mg by mouth 2 (two) times daily., Disp: , Rfl:  .  gabapentin (NEURONTIN) 100 MG capsule, Take 1 cap at bedtime  .  hydrochlorothiazide (MICROZIDE) 12.5 MG capsule, 12.5 mg., Disp: , Rfl:  .  HYDROcodone-acetaminophen (NORCO/VICODIN) 5-325 MG tablet, Take 1 tablet by mouth every 6 (six) hours as needed for moderate pain., Disp: 20 tablet, Rfl: 0 .  irbesartan-hydrochlorothiazide (AVALIDE) 150-12.5 MG per tablet, Take 1 tablet by mouth daily., Disp: , Rfl:  .  Lutein 20 MG TABS, Take by mouth., Disp: , Rfl:  .  magnesium oxide (MAG-OX) 400 MG tablet, Take 400 mg by mouth daily., Disp: , Rfl:  .  metFORMIN (GLUCOPHAGE) 1000 MG tablet, Take 1,000 mg by mouth 2 (two) times daily with a meal., Disp: , Rfl:  .  metoprolol tartrate (LOPRESSOR) 25 MG tablet, Take 1 tablet (25 mg total) by mouth 2 (two)  times daily., Disp: 60 tablet, Rfl: 3 .  nitroGLYCERIN (NITROSTAT) 0.4 MG SL tablet, Place 0.4 mg under the tongue every 5 (five) minutes as needed for chest pain., Disp: , Rfl:  .  vitamin C (ASCORBIC ACID) 500 MG tablet, Take 500 mg by mouth daily., Disp: , Rfl:  .  Vorapaxar Sulfate (ZONTIVITY) 2.08 MG TABS, Take 1 tablet by mouth daily., Disp: 30 tablet, Rfl: 0  Allergies  Allergen Reactions  . Prednisone Palpitations and Other (See Comments)    Doesn't work well for patient   Review of Systems   Review of Systems  Constitutional: Negative for  chills and fever.  HENT: Negative for hearing loss.   Eyes: Negative for blurred vision and double vision.  Respiratory: Negative for cough and shortness of breath.   Cardiovascular: Negative for chest pain and palpitations.  Gastrointestinal: Negative for abdominal pain, constipation, diarrhea, nausea and vomiting.  Genitourinary: Negative for dysuria.  Skin: Negative for itching and rash.  Neurological: Negative for dizziness, focal weakness, loss of consciousness, weakness and headaches.  Psychiatric/Behavioral: Negative for depression, hallucinations and suicidal ideas. The patient does not have insomnia.    Vitals:   Vitals:   12/10/16 1101  BP: 132/72  Pulse: 71  Temp: 98.1 F (36.7 C)  TempSrc: Oral  SpO2: 98%  Weight: 220 lb 3.2 oz (99.9 kg)  Height: 5\' 3"  (1.6 m)     Body mass index is 39.01 kg/m.   Physical Exam:    Physical Exam  Constitutional: She appears well-developed and well-nourished. No distress.  HENT:  Head: Normocephalic and atraumatic.  Eyes: EOM are normal. Pupils are equal, round, and reactive to light.  Neck: Normal range of motion. Neck supple.  Cardiovascular: Normal rate, regular rhythm and intact distal pulses.   Pulmonary/Chest: Effort normal.  Abdominal: Soft.  Neurological: She is alert.  Skin: Skin is warm.  Psychiatric: She has a normal mood and affect. Her behavior is normal.  Nursing note and vitals reviewed.    Assessment and Plan:    Michie was seen today for follow-up.  Diagnoses and all orders for this visit:  Diabetes mellitus type 2 in obese [name]) Comments: A1c is 7.9. Patient encouraged to increase hydration. Orders: -     POCT glycosylated hemoglobin (Hb A1C)  Dizziness Comments: Improving. Still taking Neurontin 100 mg q hs. Will stay at this dose for now.     . Reviewed expectations re: course of current medical issues. . Discussed self-management of symptoms. . Outlined signs and symptoms indicating need  for more acute intervention. . Patient verbalized understanding and all questions were answered. . See orders for this visit as documented in the electronic medical record. . Patient received an After Visit Summary.   CMA served as Education administrator during this visit. History, Physical, and Plan performed by medical provider. Documentation and orders reviewed and attested to. Briscoe Deutscher, D.O.  Briscoe Deutscher, [place] [place] 12/12/2016  Follow-up: Return in about 3 months (around 03/11/2017).

## 2016-12-27 ENCOUNTER — Telehealth: Payer: Self-pay | Admitting: Family Medicine

## 2016-12-27 ENCOUNTER — Other Ambulatory Visit: Payer: Self-pay

## 2016-12-27 MED ORDER — METFORMIN HCL 1000 MG PO TABS: 1000.0000 mg | ORAL_TABLET | Freq: Two times a day (BID) | ORAL | 11 refills | Status: DC

## 2016-12-27 NOTE — Telephone Encounter (Signed)
Okay to refill? 

## 2016-12-27 NOTE — Telephone Encounter (Signed)
Patient is requesting a refill on her hydrocodone.  Please advise.

## 2016-12-27 NOTE — Telephone Encounter (Signed)
Patient requesting refill on RX metFORMIN (GLUCOPHAGE) 1000 MG tablet   [place], [place] - [address] [phone] (Phone) [phone] (Fax)   Patient also requesting pain medication. Please call patient and advise.

## 2016-12-27 NOTE — Telephone Encounter (Signed)
Spoke with patient.  Informed that metformin has been sent to pharmacy.  Also let her know that we can have Rx for hydrocodone ready tomorrow (12/28/2016) for her to pick up.  Patient verbalized understanding and states that her daughter, Rogers Seeds, will come to pick up the prescription.  Rx will be placed at front desk for patient's daughter to pick up.

## 2016-12-28 ENCOUNTER — Other Ambulatory Visit: Payer: Self-pay

## 2016-12-28 DIAGNOSIS — G8929 Other chronic pain: Secondary | ICD-10-CM

## 2016-12-28 DIAGNOSIS — M5442 Lumbago with sciatica, left side: Principal | ICD-10-CM

## 2016-12-28 DIAGNOSIS — M5441 Lumbago with sciatica, right side: Principal | ICD-10-CM

## 2016-12-28 DIAGNOSIS — M79604 Pain in right leg: Secondary | ICD-10-CM

## 2016-12-28 MED ORDER — HYDROCODONE-ACETAMINOPHEN 5-325 MG PO TABS: 1.0000 | ORAL_TABLET | Freq: Four times a day (QID) | ORAL | 0 refills | Status: DC | PRN

## 2016-12-28 NOTE — Telephone Encounter (Signed)
Rx placed at front desk with note stating ok for patient's daughter, Rogers Seeds, to pick up.

## 2017-01-11 DIAGNOSIS — E119 Type 2 diabetes mellitus without complications: Secondary | ICD-10-CM | POA: Diagnosis not present

## 2017-01-11 LAB — HM DIABETES EYE EXAM

## 2017-01-13 ENCOUNTER — Telehealth: Payer: Self-pay | Admitting: Family Medicine

## 2017-01-13 MED ORDER — GABAPENTIN 100 MG PO CAPS: ORAL_CAPSULE | ORAL | 0 refills | Status: DC

## 2017-01-13 NOTE — Addendum Note (Signed)
Addended by: Durwin Glaze on: 01/13/2017 12:04 PM   Modules accepted: Orders

## 2017-01-13 NOTE — Telephone Encounter (Signed)
Please advise 

## 2017-01-13 NOTE — Telephone Encounter (Signed)
Patient called in wondering if she needed to continue taking the gabapentin (NEURONTIN) 100 MG capsule. Patient said she has enough for today and it has really been helping her. Please call patient and advise.

## 2017-01-13 NOTE — Telephone Encounter (Signed)
Prescription sent to pharmacy.

## 2017-01-13 NOTE — Telephone Encounter (Signed)
Called [facility] and changed RX to take one tablet at night.

## 2017-01-13 NOTE — Telephone Encounter (Signed)
May refill Neurontin 100 mg q hs x 3 months.

## 2017-01-14 ENCOUNTER — Telehealth: Payer: Self-pay | Admitting: Family Medicine

## 2017-01-14 NOTE — Telephone Encounter (Signed)
Patient called in stating her Rx gabapentin (NEURONTIN) 100 MG capsule dose was changed to 1 a day and she was taking the 3 a day. Patient was wondering if she could continue to do the 3 day like she was. Please call patient and advise.

## 2017-01-17 DIAGNOSIS — I10 Essential (primary) hypertension: Secondary | ICD-10-CM | POA: Diagnosis not present

## 2017-01-17 DIAGNOSIS — E119 Type 2 diabetes mellitus without complications: Secondary | ICD-10-CM | POA: Diagnosis not present

## 2017-01-17 NOTE — Telephone Encounter (Signed)
Please advise. I gave verbal to pharmacy for once a day per [facility] on Thursday. She said that is what your note said.

## 2017-01-18 NOTE — Telephone Encounter (Signed)
Pt is aware of annotations below. Pt said the gabapentin was not the cause of her dizziness and wants to resume 300mg  tid. If this okay then needs another script sent to pharmacy. Please review.

## 2017-01-18 NOTE — Telephone Encounter (Signed)
The Neurontin was changed to once daily due to it causing dizziness.

## 2017-01-19 MED ORDER — GABAPENTIN 100 MG PO CAPS: 100.0000 mg | ORAL_CAPSULE | Freq: Three times a day (TID) | ORAL | 1 refills | Status: DC

## 2017-01-19 NOTE — Telephone Encounter (Signed)
I would feel more comfortable with 100 TID first if she is willing to try. If no problems, then we can go up.

## 2017-01-19 NOTE — Telephone Encounter (Signed)
Spoke to pt, told her Dr. Juleen China said she would feel more comfortable with Gabapentin 100 mg TID first, if you are willing to try and if no problems we can go up then. PT said that would be fine. Told pt okay will send Rx to the pharmacy. Pt verbalized understanding.

## 2017-01-19 NOTE — Addendum Note (Signed)
Addended by: Marian Sorrow on: 01/19/2017 04:18 PM   Modules accepted: Orders

## 2017-01-28 ENCOUNTER — Telehealth: Payer: Self-pay | Admitting: Family Medicine

## 2017-01-28 NOTE — Telephone Encounter (Signed)
**  Remind patient they can make refill requests via MyChart**  Medication refill request (Name & Dosage):  irbesartan-hydrochlorothiazide (AVALIDE) 150-12.5 MG per tablet  Preferred pharmacy (Name & Address):  [place], [place] - [address] [phone] (Phone) [phone] (Fax)      Other comments (if applicable):    Please call patient once rx has been placed.

## 2017-01-31 ENCOUNTER — Other Ambulatory Visit: Payer: Self-pay

## 2017-01-31 MED ORDER — IRBESARTAN-HYDROCHLOROTHIAZIDE 150-12.5 MG PO TABS: 1.0000 | ORAL_TABLET | Freq: Every day | ORAL | 11 refills | Status: DC

## 2017-01-31 NOTE — Telephone Encounter (Signed)
Patient called to check the status on medication, transferred call to Amber to advise.

## 2017-01-31 NOTE — Telephone Encounter (Signed)
Refill sent to patients pharmacy. 

## 2017-02-10 ENCOUNTER — Telehealth: Payer: Self-pay | Admitting: Family Medicine

## 2017-02-10 NOTE — Telephone Encounter (Signed)
Please advise on refill. Historical provider.  

## 2017-02-10 NOTE — Telephone Encounter (Signed)
Okay 

## 2017-02-10 NOTE — Telephone Encounter (Signed)
Patient is requesting hydrochlorothiazide (MICROZIDE) 12.5 MG capsule [[id number]] x90  And allopurinol (ZYLOPRIM) 100 MG tablet [[id number]] x90 day supply sent to [facility] on [address]

## 2017-02-11 MED ORDER — ALLOPURINOL 100 MG PO TABS: 100.0000 mg | ORAL_TABLET | Freq: Every morning | ORAL | 2 refills | Status: DC

## 2017-02-11 MED ORDER — HYDROCHLOROTHIAZIDE 12.5 MG PO CAPS: 12.5000 mg | ORAL_CAPSULE | Freq: Every day | ORAL | 1 refills | Status: DC

## 2017-02-11 NOTE — Telephone Encounter (Signed)
RX sent to pharmacy  

## 2017-02-15 DIAGNOSIS — Z950 Presence of cardiac pacemaker: Secondary | ICD-10-CM | POA: Diagnosis not present

## 2017-03-11 ENCOUNTER — Ambulatory Visit: Payer: Medicare Other | Admitting: Family Medicine

## 2017-03-25 ENCOUNTER — Ambulatory Visit (INDEPENDENT_AMBULATORY_CARE_PROVIDER_SITE_OTHER): Payer: Medicare Other | Admitting: Family Medicine

## 2017-03-25 ENCOUNTER — Encounter: Payer: Self-pay | Admitting: Family Medicine

## 2017-03-25 ENCOUNTER — Telehealth: Payer: Self-pay | Admitting: Family Medicine

## 2017-03-25 VITALS — BP 128/78 | HR 74 | Temp 98.0°F | Ht 63.0 in | Wt 217.8 lb

## 2017-03-25 DIAGNOSIS — M5441 Lumbago with sciatica, right side: Secondary | ICD-10-CM

## 2017-03-25 DIAGNOSIS — D509 Iron deficiency anemia, unspecified: Secondary | ICD-10-CM

## 2017-03-25 DIAGNOSIS — E669 Obesity, unspecified: Secondary | ICD-10-CM | POA: Diagnosis not present

## 2017-03-25 DIAGNOSIS — E785 Hyperlipidemia, unspecified: Secondary | ICD-10-CM

## 2017-03-25 DIAGNOSIS — E1169 Type 2 diabetes mellitus with other specified complication: Secondary | ICD-10-CM | POA: Diagnosis not present

## 2017-03-25 DIAGNOSIS — M5442 Lumbago with sciatica, left side: Secondary | ICD-10-CM | POA: Diagnosis not present

## 2017-03-25 DIAGNOSIS — R202 Paresthesia of skin: Secondary | ICD-10-CM | POA: Diagnosis not present

## 2017-03-25 DIAGNOSIS — G8929 Other chronic pain: Secondary | ICD-10-CM

## 2017-03-25 DIAGNOSIS — I10 Essential (primary) hypertension: Secondary | ICD-10-CM

## 2017-03-25 DIAGNOSIS — IMO0001 Reserved for inherently not codable concepts without codable children: Secondary | ICD-10-CM

## 2017-03-25 LAB — COMPREHENSIVE METABOLIC PANEL
ALT: 11 U/L (ref 0–35)
AST: 13 U/L (ref 0–37)
Albumin: 4.5 g/dL (ref 3.5–5.2)
Alkaline Phosphatase: 59 U/L (ref 39–117)
BUN: 29 mg/dL — ABNORMAL HIGH (ref 6–23)
CO2: 27 mEq/L (ref 19–32)
Calcium: 10.7 mg/dL — ABNORMAL HIGH (ref 8.4–10.5)
Chloride: 101 mEq/L (ref 96–112)
Creatinine, Ser: 1.04 mg/dL (ref 0.40–1.20)
GFR: 65.98 mL/min (ref >60.00)
Glucose, Bld: 164 mg/dL — ABNORMAL HIGH (ref 70–99)
Potassium: 4.3 mEq/L (ref 3.5–5.1)
Sodium: 138 mEq/L (ref 135–145)
Total Bilirubin: 0.4 mg/dL (ref 0.2–1.2)
Total Protein: 8.3 g/dL (ref 6.0–8.3)

## 2017-03-25 LAB — CBC WITH DIFFERENTIAL/PLATELET
Basophils Absolute: 0 10*3/uL (ref 0.0–0.1)
Basophils Relative: 0.4 % (ref 0.0–3.0)
Eosinophils Absolute: 0.1 10*3/uL (ref 0.0–0.7)
Eosinophils Relative: 1.4 % (ref 0.0–5.0)
HCT: 33.5 % — ABNORMAL LOW (ref 36.0–46.0)
Hemoglobin: 10.6 g/dL — ABNORMAL LOW (ref 12.0–15.0)
Lymphocytes Relative: 22.3 % (ref 12.0–46.0)
Lymphs Abs: 1.5 10*3/uL (ref 0.7–4.0)
MCHC: 31.5 g/dL (ref 30.0–36.0)
MCV: 79 fl (ref 78.0–100.0)
Monocytes Absolute: 0.5 10*3/uL (ref 0.1–1.0)
Monocytes Relative: 7.2 % (ref 3.0–12.0)
Neutro Abs: 4.7 10*3/uL (ref 1.4–7.7)
Neutrophils Relative %: 68.7 % (ref 43.0–77.0)
Platelets: 305 10*3/uL (ref 150.0–400.0)
RBC: 4.23 Mil/uL (ref 3.87–5.11)
RDW: 15.1 % (ref 11.5–15.5)
WBC: 6.9 10*3/uL (ref 4.0–10.5)

## 2017-03-25 LAB — LIPID PANEL
Cholesterol: 122 mg/dL (ref 0–200)
HDL: 55.3 mg/dL (ref >39.00)
LDL Cholesterol: 44 mg/dL (ref 0–99)
NonHDL: 66.33
Total CHOL/HDL Ratio: 2
Triglycerides: 113 mg/dL (ref 0.0–149.0)
VLDL: 22.6 mg/dL (ref 0.0–40.0)

## 2017-03-25 LAB — HEMOGLOBIN A1C: Hgb A1c MFr Bld: 8.5 % — ABNORMAL HIGH (ref 4.6–6.5)

## 2017-03-25 MED ORDER — HYDROCODONE-ACETAMINOPHEN 5-325 MG PO TABS: 1.0000 | ORAL_TABLET | Freq: Four times a day (QID) | ORAL | 0 refills | Status: DC | PRN

## 2017-03-25 MED ORDER — GABAPENTIN 100 MG PO CAPS: 200.0000 mg | ORAL_CAPSULE | Freq: Three times a day (TID) | ORAL | 3 refills | Status: DC

## 2017-03-25 NOTE — Progress Notes (Signed)
Krista Clarke is a 76 y.o. female is here for follow up.  History of Present Illness:   Water quality scientist, CMA, acting as scribe for Dr. Juleen China.  HPI:  Patient comes in today for follow up.  States she continues to have leg, back, and foot pain.  She was taking Norco and states that helped her pain considerably, but she ran out of it.  She has been taking Tylenol but that is not helping her pain.  Will prescribe Norco today.  Reassured patient that it is ok for her to take this medication in order to stop her pain.  She continues to have intermittent vertigo.  Takes meclizine as needed for this.  States her diabetes has been under good control.  Her sugars are normally under 200.  Blood pressure is great today.  She is fasting today. She states she has been watching what she eats and has lost 3 pounds since last visit.  She eats out a lot.  Will provide booklet today to help her make healthy choices when eating out.  She has neuropathy in her feet and has not been driving.  She would like to be able to drive again.  Will increase neurontin.  Health Maintenance Due  Topic Date Due  . OPHTHALMOLOGY EXAM  08/30/1949  . TETANUS/TDAP  08/30/1958  . PNA vac Low Risk Adult (1 of 2 - PCV13) 08/30/2004   Depression screen PHQ 2/9 03/25/2017 10/01/2016  Decreased Interest 0 0  Down, Depressed, Hopeless 0 0  PHQ - 2 Score 0 0   PMHx, SurgHx, SocialHx, FamHx, Medications, and Allergies were reviewed in the Visit Navigator and updated as appropriate.   Patient Active Problem List   Diagnosis Date Noted  . LBBB (left bundle branch block) 11/18/2016  . Pacemaker 11/18/2016  . PAD (peripheral artery disease) ([place]) 11/18/2016  . 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 Chronic midline low back pain with bilateral sciatica 10/02/2016  . Paresthesia of both lower extremities 10/02/2016  . Obesity, Class II, BMI 35-39.9, with comorbidity 10/02/2016  . History of hyperlipidemia 10/02/2016  . Essential  hypertension 03/08/2016  . Anemia, iron deficiency 03/08/2016  . Diabetes mellitus type 2 in obese ([place]) 03/08/2016  . Complete heart block ([place]) 08/20/2015  . Angina pectoris ([place]) 08/26/2014   Social History  Substance Use Topics  . Smoking status: Never Smoker  . Smokeless tobacco: Never Used  . Alcohol use No   Current Medications and Allergies:   .  acetaminophen (TYLENOL) 500 MG tablet, Take 1,000 mg by mouth every 6 (six) hours as needed. Reported on 08/20/2015, Disp: , Rfl:  .  allopurinol (ZYLOPRIM) 100 MG tablet, Take 1 tablet (100 mg total) by mouth every morning., Disp: 30 tablet, Rfl: 2 .  amLODipine (NORVASC) 5 MG tablet, Take 1 tablet (5 mg total) by mouth daily., Disp: 30 tablet, Rfl: 0 .  aspirin 81 MG chewable tablet, Chew by mouth daily., Disp: , Rfl:  .  atorvastatin (LIPITOR) 40 MG tablet, , Disp: , Rfl:  .  Calcium Citrate-Vitamin D (CALCIUM CITRATE + D PO), Take 1 tablet by mouth daily., Disp: , Rfl:  .  ferrous sulfate 325 (65 FE) MG EC tablet, Take 325 mg by mouth 2 (two) times daily., Disp: , Rfl:  .  gabapentin (NEURONTIN) 100 MG capsule, Take 1 capsule (100 mg total) by mouth 3 (three) times daily., Disp: 90 capsule, Rfl: 1 .  hydrochlorothiazide (MICROZIDE) 12.5 MG capsule, Take 1 capsule (  12.5 mg total) by mouth daily., Disp: 90 capsule, Rfl: 1 .  irbesartan-hydrochlorothiazide (AVALIDE) 150-12.5 MG tablet, Take 1 tablet by mouth daily., Disp: 30 tablet, Rfl: 11 .  Lutein 20 MG TABS, Take by mouth., Disp: , Rfl:  .  magnesium oxide (MAG-OX) 400 MG tablet, Take 400 mg by mouth daily., Disp: , Rfl:  .  meclizine (ANTIVERT) 25 MG tablet, Take 25 mg by mouth 2 (two) times daily., Disp: , Rfl:  .  metFORMIN (GLUCOPHAGE) 1000 MG tablet, Take 1 tablet (1,000 mg total) by mouth 2 (two) times daily with a meal., Disp: 60 tablet, Rfl: 11 .  metoprolol tartrate (LOPRESSOR) 25 MG tablet, Take 1 tablet (25 mg total) by mouth 2 (two) times daily., Disp: 60 tablet, Rfl:  3 .  nitroGLYCERIN (NITROSTAT) 0.4 MG SL tablet, Place 0.4 mg under the tongue every 5 (five) minutes as needed for chest pain., Disp: , Rfl:  .  vitamin C (ASCORBIC ACID) 500 MG tablet, Take 500 mg by mouth daily., Disp: , Rfl:  .  Vorapaxar Sulfate (ZONTIVITY) 2.08 MG TABS, Take 1 tablet by mouth daily., Disp: 30 tablet, Rfl: 0  Allergies  Allergen Reactions  . Prednisone Palpitations and Other (See Comments)    Doesn't work well for patient   Review of Systems   Pertinent items are noted in the HPI. Otherwise, ROS is negative.  Vitals:   Vitals:   03/25/17 1030  BP: 128/78  Pulse: 74  Temp: 98 F (36.7 C)  TempSrc: Oral  SpO2: 98%  Weight: 217 lb 12.8 oz (98.8 kg)  Height: 5\' 3"  (1.6 m)     Body mass index is 38.58 kg/m.  Physical Exam:   Physical Exam  Constitutional: She appears well-developed and well-nourished. No distress.  HENT:  Head: Normocephalic and atraumatic.  Eyes: Pupils are equal, round, and reactive to light. EOM are normal.  Neck: Normal range of motion. Neck supple.  Cardiovascular: Normal rate, regular rhythm and intact distal pulses.   Pulmonary/Chest: Effort normal and breath sounds normal.  Abdominal: Soft. Bowel sounds are normal.  Neurological: She is alert.  Skin: Skin is warm.  Psychiatric: She has a normal mood and affect. Her behavior is normal.  Nursing note and vitals reviewed.   Diabetic Foot Exam - Simple   Simple Foot Form Diabetic Foot exam was performed with the following findings:  Yes 03/25/2017  8:47 PM  Visual Inspection No deformities, no ulcerations, no other skin breakdown bilaterally:  Yes Sensation Testing Intact to touch and monofilament testing bilaterally:  Yes Pulse Check Posterior Tibialis and Dorsalis pulse intact bilaterally:  Yes Comments     Results for orders placed or performed in visit on 03/25/17  CBC with Differential/Platelet  Result Value Ref Range   WBC 6.9 4.0 - 10.5 K/uL   RBC 4.23 3.87 -  5.11 Mil/uL   Hemoglobin 10.6 (L) 12.0 - 15.0 g/dL   HCT 33.5 (L) 36.0 - 46.0 %   MCV 79.0 78.0 - 100.0 fl   MCHC 31.5 30.0 - 36.0 g/dL   RDW 15.1 11.5 - 15.5 %   Platelets 305.0 150.0 - 400.0 K/uL   Neutrophils Relative % 68.7 43.0 - 77.0 %   Lymphocytes Relative 22.3 12.0 - 46.0 %   Monocytes Relative 7.2 3.0 - 12.0 %   Eosinophils Relative 1.4 0.0 - 5.0 %   Basophils Relative 0.4 0.0 - 3.0 %   Neutro Abs 4.7 1.4 - 7.7 K/uL   Lymphs Abs  1.5 0.7 - 4.0 K/uL   Monocytes Absolute 0.5 0.1 - 1.0 K/uL   Eosinophils Absolute 0.1 0.0 - 0.7 K/uL   Basophils Absolute 0.0 0.0 - 0.1 K/uL  Comprehensive metabolic panel  Result Value Ref Range   Sodium 138 135 - 145 mEq/L   Potassium 4.3 3.5 - 5.1 mEq/L   Chloride 101 96 - 112 mEq/L   CO2 27 19 - 32 mEq/L   Glucose, Bld 164 (H) 70 - 99 mg/dL   BUN 29 (H) 6 - 23 mg/dL   Creatinine, Ser 1.04 0.40 - 1.20 mg/dL   Total Bilirubin 0.4 0.2 - 1.2 mg/dL   Alkaline Phosphatase 59 39 - 117 U/L   AST 13 0 - 37 U/L   ALT 11 0 - 35 U/L   Total Protein 8.3 6.0 - 8.3 g/dL   Albumin 4.5 3.5 - 5.2 g/dL   Calcium 10.7 (H) 8.4 - 10.5 mg/dL   GFR 65.98 >60.00 mL/min  Hemoglobin A1c  Result Value Ref Range   Hgb A1c MFr Bld 8.5 (H) 4.6 - 6.5 %  Lipid panel  Result Value Ref Range   Cholesterol 122 0 - 200 mg/dL   Triglycerides 113.0 0.0 - 149.0 mg/dL   HDL 55.30 >39.00 mg/dL   VLDL 22.6 0.0 - 40.0 mg/dL   LDL Cholesterol 44 0 - 99 mg/dL   Total CHOL/HDL Ratio 2    NonHDL 66.33    Assessment and Plan:    Problem  Chronic Midline Low Back Pain With Bilateral Sciatica   Patient does well using Norco prn breakthrough pain. Okay to refill today with the intent for her to use it sparingly. No new concerns or red flags.    Paresthesia of Both Lower Extremities   Okay to increase Neurontin to 200 mg po TID.   Obesity, Class II, Bmi 35-39.9, With Comorbidity   The patient is asked to make an attempt to improve diet and exercise patterns to aid in  medical management of this problem.    History of Hyperlipidemia   Lab Results  Component Value Date   CHOL 122 03/25/2017   HDL 55.30 03/25/2017   LDLCALC 44 03/25/2017   TRIG 113.0 03/25/2017   CHOLHDL 2 03/25/2017   Patient tolerating Lipitor without issues.   Essential Hypertension   Well controlled.  No signs of complications, medication side effects, or red flags.  Continue current regimen.    BP Readings from Last 3 Encounters:  03/25/17 128/78  12/10/16 132/72  12/06/16 126/72     Anemia, Iron Deficiency   Hgb = 9.5 on 08/03/16. Per previous PCP, noted to be worsening and referral to GI recommended.   CBC Latest Ref Rng & Units 03/25/2017 03/08/2016 03/07/2016  WBC 4.0 - 10.5 K/uL 6.9 - 7.1  Hemoglobin 12.0 - 15.0 g/dL 10.6(L) 12.6 10.6(L)  Hematocrit 36.0 - 46.0 % 33.5(L) 37.0 34.2(L)  Platelets 150.0 - 400.0 K/uL 305.0 - 250   No CP, SOB, melena. Will recheck in 1 month.    Diabetes Mellitus Type 2 in Obese ([facility])   Current symptoms: no polyuria or polydipsia, no chest pain, dyspnea or TIA's, has dysesthesias in the feet.  Taking medication compliantly without noted sided effects [x]   YES  []   NO  Episodes of hypoglycemia? []   YES  [x]   NO Maintaining a diabetic diet? [x]   YES  []   NO Trying to exercise on a regular basis? []   YES  [x]   NO  On ACE  inhibitor or angiotensin II receptor blocker? [x]   YES  []   NO On Aspirin? [x]   YES  []   NO  Lab Results  Component Value Date   HGBA1C 8.5 (H) 03/25/2017    No results found for: Derl Barrow   Lab Results  Component Value Date   CHOL 122 03/25/2017   HDL 55.30 03/25/2017   LDLCALC 44 03/25/2017   TRIG 113.0 03/25/2017   CHOLHDL 2 03/25/2017     Wt Readings from Last 3 Encounters:  03/25/17 217 lb 12.8 oz (98.8 kg)  12/10/16 220 lb 3.2 oz (99.9 kg)  12/06/16 218 lb 6.4 oz (99.1 kg)   BP Readings from Last 3 Encounters:  03/25/17 128/78  12/10/16 132/72  12/06/16 126/72   Lab Results   Component Value Date   CREATININE 1.04 03/25/2017      . Reviewed expectations re: course of current medical issues. . Discussed self-management of symptoms. . Outlined signs and symptoms indicating need for more acute intervention. . Patient verbalized understanding and all questions were answered. Marland Kitchen Health Maintenance issues including appropriate healthy diet, exercise, and smoking avoidance were discussed with patient. . See orders for this visit as documented in the electronic medical record. . Patient received an After Visit Summary.  CMA served as Education administrator during this visit. History, Physical, and Plan performed by medical provider. The above documentation has been reviewed and is accurate and complete. Briscoe Deutscher, D.O.  Briscoe Deutscher, DO [place], [place] 03/26/2017  Future Appointments Date Time Provider [place]  06/30/2017 10:00 AM Briscoe Deutscher, DO [facility] None

## 2017-03-25 NOTE — Telephone Encounter (Incomplete)
Called and spoke with pharmacist.

## 2017-03-25 NOTE — Telephone Encounter (Signed)
Attempted to contact pharmacy to get carnification. Unable to get anyone to answer after attempting over 20 times. Will try again at a later time.

## 2017-03-25 NOTE — Telephone Encounter (Signed)
Patient calling to get clarification on the pain medication that was given to her today, patient was unable to get it. Call patient to advise.

## 2017-03-25 NOTE — Patient Instructions (Addendum)
Ok to take hydrocodone to help with your pain.  Increase gabapentin to 2 capsules (200 mg) three times daily.

## 2017-03-25 NOTE — Telephone Encounter (Signed)
Attempted to contact [facility] again remained on hold 45 min with no answer. Will attempt to call again Monday. Staff member verified attempts made as well as hold time.

## 2017-03-28 ENCOUNTER — Other Ambulatory Visit: Payer: Self-pay | Admitting: Family Medicine

## 2017-03-28 DIAGNOSIS — D509 Iron deficiency anemia, unspecified: Secondary | ICD-10-CM

## 2017-03-28 NOTE — Telephone Encounter (Signed)
Per [facility], any controlled substance patient can only get a 5 day supply.  Then after 5 days, they can get a 7 day supply, then they can begin to receive 30 day supplies if the provider chooses to keep the patient on the medication.  Patient states she will call around to other pharmacies to see if she can find a less expensive place to get it filled.  She states [facility] cost $58.00.

## 2017-03-30 MED FILL — HYDROCODON-APAP 5-325: 5-325 | 30 days supply | Qty: 120 | Fill #0

## 2017-04-28 ENCOUNTER — Encounter: Payer: Self-pay | Admitting: *Deleted

## 2017-04-28 ENCOUNTER — Other Ambulatory Visit: Payer: Medicare Other

## 2017-04-28 ENCOUNTER — Ambulatory Visit (INDEPENDENT_AMBULATORY_CARE_PROVIDER_SITE_OTHER): Payer: Medicare Other | Admitting: *Deleted

## 2017-04-28 VITALS — BP 138/62 | HR 64 | Resp 16 | Ht 63.0 in | Wt 220.8 lb

## 2017-04-28 DIAGNOSIS — D509 Iron deficiency anemia, unspecified: Secondary | ICD-10-CM | POA: Diagnosis not present

## 2017-04-28 DIAGNOSIS — Z0000 Encounter for general adult medical examination without abnormal findings: Secondary | ICD-10-CM | POA: Diagnosis not present

## 2017-04-28 NOTE — Addendum Note (Signed)
Addended by: Frutoso Chase A on: 04/28/2017 12:28 PM   Modules accepted: Orders

## 2017-04-28 NOTE — Progress Notes (Signed)
Pre visit review using our clinic review tool, if applicable. No additional management support is needed unless otherwise documented below in the visit note. 

## 2017-04-28 NOTE — Progress Notes (Signed)
Subjective:   Krista Clarke is a 76 y.o. female who presents for an Initial Medicare Annual Wellness Visit.  Pt c/o chronic knee pain. States will call Dr Krista Clarke (ortho).  Review of Systems    No ROS.  Medicare Wellness Visit. Additional risk factors are reflected in the social history.  Lives alone. Family helps as needed.   Cardiac Risk Factors include: advanced age (>71men, >85 women);dyslipidemia;diabetes mellitus;hypertension;obesity (BMI >30kg/m2);sedentary lifestyle     Objective:    Today's Vitals   04/28/17 1143  BP: 138/62  Pulse: 64  Resp: 16  SpO2: 98%  Weight: 220 lb 12.8 oz (100.2 kg)  Height: 5\' 3"  (1.6 m)   Body mass index is 39.11 kg/m.   Current Medications (verified) Outpatient Encounter Prescriptions as of 04/28/2017  Medication Sig  . acetaminophen (TYLENOL) 500 MG tablet Take 1,000 mg by mouth every 6 (six) hours as needed. Reported on 08/20/2015  . allopurinol (ZYLOPRIM) 100 MG tablet Take 1 tablet (100 mg total) by mouth every morning.  Marland Kitchen amLODipine (NORVASC) 5 MG tablet Take 1 tablet (5 mg total) by mouth daily.  Marland Kitchen aspirin 81 MG chewable tablet Chew by mouth daily.  Marland Kitchen atorvastatin (LIPITOR) 40 MG tablet Take 40 mg by mouth daily.   . Calcium Citrate-Vitamin D (CALCIUM CITRATE + D PO) Take 1 tablet by mouth daily.  . ferrous sulfate 325 (65 FE) MG EC tablet Take 325 mg by mouth 2 (two) times daily.  Marland Kitchen gabapentin (NEURONTIN) 100 MG capsule Take 2 capsules (200 mg total) by mouth 3 (three) times daily.  . hydrochlorothiazide (MICROZIDE) 12.5 MG capsule Take 1 capsule (12.5 mg total) by mouth daily.  Marland Kitchen HYDROcodone-acetaminophen (NORCO/VICODIN) 5-325 MG tablet Take 1 tablet by mouth 4 (four) times daily as needed for moderate pain.  Marland Kitchen irbesartan-hydrochlorothiazide (AVALIDE) 150-12.5 MG tablet Take 1 tablet by mouth daily.  . Lutein 20 MG TABS Take by mouth daily.   . magnesium oxide (MAG-OX) 400 MG tablet Take 400 mg by mouth daily.  . meclizine  (ANTIVERT) 25 MG tablet Take 25 mg by mouth 2 (two) times daily.  . metFORMIN (GLUCOPHAGE) 1000 MG tablet Take 1 tablet (1,000 mg total) by mouth 2 (two) times daily with a meal.  . metoprolol tartrate (LOPRESSOR) 25 MG tablet Take 1 tablet (25 mg total) by mouth 2 (two) times daily.  . vitamin C (ASCORBIC ACID) 500 MG tablet Take 500 mg by mouth daily.  . Vorapaxar Sulfate (ZONTIVITY) 2.08 MG TABS Take 1 tablet by mouth daily.  . nitroGLYCERIN (NITROSTAT) 0.4 MG SL tablet Place 0.4 mg under the tongue every 5 (five) minutes as needed for chest pain.   No facility-administered encounter medications on file as of 04/28/2017.     Allergies (verified) Prednisone   History: Past Medical History:  Diagnosis Date  . Anemia    takes iron daily  . Arthritis   . Diabetes mellitus without complication ([place])   . Hypercholesteremia   . Hypertension    Past Surgical History:  Procedure Laterality Date  . ABDOMINAL HYSTERECTOMY    . COLONOSCOPY WITH PROPOFOL N/A 07/03/2013   Procedure: COLONOSCOPY WITH PROPOFOL;  Surgeon: Garlan Fair, MD;  Location: [facility];  Service: Endoscopy;  Laterality: N/A;  . EP IMPLANTABLE DEVICE N/A 08/20/2015   Procedure: Pacemaker Implant;  Surgeon: Evans Lance, MD;  Location: [place] CV LAB;  Service: Cardiovascular;  Laterality: N/A;  . JOINT REPLACEMENT Bilateral    '99. Knees  .  LEFT HEART CATHETERIZATION WITH CORONARY ANGIOGRAM N/A 08/27/2014   Procedure: LEFT HEART CATHETERIZATION WITH CORONARY ANGIOGRAM;  Surgeon: Laverda Page, MD;  Location: [facility] CATH LAB;  Service: Cardiovascular;  Laterality: N/A;  . TUBAL LIGATION     Family History  Problem Relation Age of Onset  . Hypertension Mother   . Diabetes Mother   . Hypertension Father   . Cancer Brother    Social History   Occupational History  . Retired     Social History Main Topics  . Smoking status: Never Smoker  . Smokeless tobacco: Never Used  . Alcohol use No  . Drug use:  No  . Sexual activity: Not Currently    Tobacco Counseling Counseling given: Not Answered   Activities of Daily Living In your present state of health, do you have any difficulty performing the following activities: 04/28/2017  Hearing? N  Vision? N  Difficulty concentrating or making decisions? N  Walking or climbing stairs? Y  Comment Uses walker  Dressing or bathing? N  Doing errands, shopping? Y  Preparing Food and eating ? N  Using the Toilet? N  In the past six months, have you accidently leaked urine? Y  Do you have problems with loss of bowel control? N  Managing your Medications? N  Managing your Finances? N  Housekeeping or managing your Housekeeping? N  Some recent data might be hidden    Immunizations and Health Maintenance  There is no immunization history on file for this patient. Health Maintenance Due  Topic Date Due  . OPHTHALMOLOGY EXAM  08/30/1950  . TETANUS/TDAP  08/30/1958  . PNA vac Low Risk Adult (1 of 2 - PCV13) 08/30/2004  . INFLUENZA VACCINE  03/30/2017    Patient Care Team: Briscoe Deutscher, DO as PCP - General (Family Medicine) Adrian Prows, MD as Consulting Physician (Cardiology) Specialists, [place] as Consulting Physician (Orthopedic Surgery)  Indicate any recent Medical Services you may have received from other than [facility] providers in the past year (date may be approximate).     Assessment:   This is a routine wellness examination for Krista Clarke. Physical assessment deferred to PCP.  Hearing/Vision screen  Hearing Screening   125Hz  250Hz  500Hz  1000Hz  2000Hz  3000Hz  4000Hz  6000Hz  8000Hz   Right ear:   40 40 40  40    Left ear:   40 40 40  40    Vision Screening Comments: Dr Krista Clarke, June 2018.  Dietary issues and exercise activities discussed: Current Exercise Habits: The patient does not participate in regular exercise at present (Pt will try balance class with niece. ), Exercise limited by: None identified  Diet (meal  preparation, eat out, water intake, caffeinated beverages, dairy products, fruits and vegetables): 2 meals/day. Sometimes home cooked sometimes take out. Discussed choosing healthy options when eating out and avoiding processed foods. 8 glasses water/day. Regular soda twice/week. Discussed quitting soda all together. ! Cup coffee a few times/week.  Goals    . Exercise 3x per week (30 min per time)      Depression Screen PHQ 2/9 Scores 04/28/2017 03/25/2017 10/01/2016  PHQ - 2 Score 0 0 0    Fall Risk Fall Risk  04/28/2017 03/25/2017 12/06/2016 10/01/2016  Falls in the past year? Yes No No Yes  Number falls in past yr: 2 or more - - 1  Injury with Fall? No - - No  Risk Factor Category  High Fall Risk - - -  Risk for fall due to : Impaired  mobility - - -  Follow up Education provided;Falls prevention discussed - - (No Data)  Comment - - - patient uses cane and walker when needed.    Cognitive Function: MMSE - Mini Mental State Exam 04/28/2017  Orientation to time 5  Orientation to Place 5  Registration 3  Attention/ Calculation 3  Recall 1  Language- name 2 objects 2  Language- repeat 1  Language- follow 3 step command 3  Language- read & follow direction 1  Write a sentence 1  Copy design 1  Total score 26        Screening Tests Health Maintenance  Topic Date Due  . OPHTHALMOLOGY EXAM  08/30/1950  . TETANUS/TDAP  08/30/1958  . PNA vac Low Risk Adult (1 of 2 - PCV13) 08/30/2004  . INFLUENZA VACCINE  03/30/2017  . HEMOGLOBIN A1C  09/25/2017  . FOOT EXAM  03/25/2018  . DEXA SCAN  Completed      Plan:   Follow up with PCP as directed. Records release faxed to prior PCP Dr Jilda Panda. Pt states all  Of her vaccines were received there.  Bring a copy living will and/or healthcare power of attorney to next office visit. Let office know if she cannot get in to see Dr Krista Clarke. Pt will increase exercise and continue doing brain stimulating activities.   I have personally reviewed  and noted the following in the patient's chart:   . Medical and social history . Use of alcohol, tobacco or illicit drugs  . Current medications and supplements . Functional ability and status . Nutritional status . Physical activity . Advanced directives . List of other physicians . Vitals . Screenings to include cognitive, depression, and falls . Referrals and appointments  In addition, I have reviewed and discussed with patient certain preventive protocols, quality metrics, and best practice recommendations. A written personalized care plan for preventive services as well as general preventive health recommendations were provided to patient.     Ree Edman, RN   04/28/2017

## 2017-04-28 NOTE — Progress Notes (Addendum)
PCP notes:   Health maintenance: Opthamology exam - [facility] 01/11/2017. Records requested today.  TDAP- Pt believes she is up to date with this through prior PCP. PCV13-  Pt believes she is up to date with this through prior PCP. Pt will receive FLU vaccine at 06/30/2017 appt with Dr Juleen China.   Abnormal screenings: MMS Exam.   Patient concerns: None.   Nurse concerns: None.    Next PCP appt: 06/30/2017  I have personally reviewed the Medicare Annual Wellness questionnaire and have noted 1. The patient's medical and social history 2. Their use of alcohol, tobacco or illicit drugs 3. Their current medications and supplements 4. The patient's functional ability including ADL's, fall risks, home safety risks and hearing or visual impairment. 5. Diet and physical activities 6. Evidence for depression or mood disorders 7. Reviewed Updated provider list, see scanned forms and [facility] Snapshot.   The patients weight, height, BMI and visual acuity have been recorded in the chart I have made referrals, counseling and provided education to the patient based review of the above and I have provided the pt with a written personalized care plan for preventive services.  I have provided the patient with a copy of your personalized plan for preventive services. Instructed to take the time to review along with their updated medication list.  Briscoe Deutscher, D.O. [place], [facility]

## 2017-04-28 NOTE — Patient Instructions (Addendum)
Ms. Alen ,  Bring a copy of your living will and/or healthcare power of attorney to your next office visit.  Thank you for taking time to come for your Medicare Wellness Visit. I appreciate your ongoing commitment to your health goals. Please review the following plan we discussed and let me know if I can assist you in the future.   These are the goals we discussed: Goals    . Exercise 3x per week (30 min per time)       This is a list of the screening recommended for you and due dates:  Health Maintenance  Topic Date Due  . Eye exam for diabetics  08/30/1949  . Tetanus Vaccine  08/30/1958  . Pneumonia vaccines (1 of 2 - PCV13) 08/30/2004  . Flu Shot  03/30/2017  . Hemoglobin A1C  09/25/2017  . Complete foot exam   03/25/2018  . DEXA scan (bone density measurement)  Completed   Preventive Care for Adults  A healthy lifestyle and preventive care can promote health and wellness. Preventive health guidelines for adults include the following key practices.  . A routine yearly physical is a good way to check with your health care provider about your health and preventive screening. It is a chance to share any concerns and updates on your health and to receive a thorough exam.  . Visit your dentist for a routine exam and preventive care every 6 months. Brush your teeth twice a day and floss once a day. Good oral hygiene prevents tooth decay and gum disease.  . The frequency of eye exams is based on your age, health, family medical history, use  of contact lenses, and other factors. Follow your health care provider's ecommendations for frequency of eye exams.  . Eat a healthy diet. Foods like vegetables, fruits, whole grains, low-fat dairy products, and lean protein foods contain the nutrients you need without too many calories. Decrease your intake of foods high in solid fats, added sugars, and salt. Eat the right amount of calories for you. Get information about a proper diet from your  health care provider, if necessary.  . Regular physical exercise is one of the most important things you can do for your health. Most adults should get at least 150 minutes of moderate-intensity exercise (any activity that increases your heart rate and causes you to sweat) each week. In addition, most adults need muscle-strengthening exercises on 2 or more days a week.  Silver Sneakers may be a benefit available to you. To determine eligibility, you may visit the website: www.silversneakers.com or contact program at 737-275-0476 Mon-Fri between 8AM-8PM.   . Maintain a healthy weight. The body mass index (BMI) is a screening tool to identify possible weight problems. It provides an estimate of body fat based on height and weight. Your health care provider can find your BMI and can help you achieve or maintain a healthy weight.   For adults 20 years and older: ? A BMI below 18.5 is considered underweight. ? A BMI of 18.5 to 24.9 is normal. ? A BMI of 25 to 29.9 is considered overweight. ? A BMI of 30 and above is considered obese.   . Maintain normal blood lipids and cholesterol levels by exercising and minimizing your intake of saturated fat. Eat a balanced diet with plenty of fruit and vegetables. Blood tests for lipids and cholesterol should begin at age 53 and be repeated every 5 years. If your lipid or cholesterol levels are high, you  are over 74, or you are at high risk for heart disease, you may need your cholesterol levels checked more frequently. Ongoing high lipid and cholesterol levels should be treated with medicines if diet and exercise are not working.  . If you smoke, find out from your health care provider how to quit. If you do not use tobacco, please do not start.  . If you choose to drink alcohol, please do not consume more than 2 drinks per day. One drink is considered to be 12 ounces (355 mL) of beer, 5 ounces (148 mL) of wine, or 1.5 ounces (44 mL) of liquor.  . If you are  15-27 years old, ask your health care provider if you should take aspirin to prevent strokes.  . Use sunscreen. Apply sunscreen liberally and repeatedly throughout the day. You should seek shade when your shadow is shorter than you. Protect yourself by wearing long sleeves, pants, a wide-brimmed hat, and sunglasses year round, whenever you are outdoors.  . Once a month, do a whole body skin exam, using a mirror to look at the skin on your back. Tell your health care provider of new moles, moles that have irregular borders, moles that are larger than a pencil eraser, or moles that have changed in shape or color.   Fall Prevention in the Home Falls can cause injuries. They can happen to people of all ages. There are many things you can do to make your home safe and to help prevent falls. What can I do on the outside of my home?  Regularly fix the edges of walkways and driveways and fix any cracks.  Remove anything that might make you trip as you walk through a door, such as a raised step or threshold.  Trim any bushes or trees on the path to your home.  Use bright outdoor lighting.  Clear any walking paths of anything that might make someone trip, such as rocks or tools.  Regularly check to see if handrails are loose or broken. Make sure that both sides of any steps have handrails.  Any raised decks and porches should have guardrails on the edges.  Have any leaves, snow, or ice cleared regularly.  Use sand or salt on walking paths during winter.  Clean up any spills in your garage right away. This includes oil or grease spills. What can I do in the bathroom?  Use night lights.  Install grab bars by the toilet and in the tub and shower. Do not use towel bars as grab bars.  Use non-skid mats or decals in the tub or shower.  If you need to sit down in the shower, use a plastic, non-slip stool.  Keep the floor dry. Clean up any water that spills on the floor as soon as it  happens.  Remove soap buildup in the tub or shower regularly.  Attach bath mats securely with double-sided non-slip rug tape.  Do not have throw rugs and other things on the floor that can make you trip. What can I do in the bedroom?  Use night lights.  Make sure that you have a light by your bed that is easy to reach.  Do not use any sheets or blankets that are too big for your bed. They should not hang down onto the floor.  Have a firm chair that has side arms. You can use this for support while you get dressed.  Do not have throw rugs and other things on the floor  that can make you trip. What can I do in the kitchen?  Clean up any spills right away.  Avoid walking on wet floors.  Keep items that you use a lot in easy-to-reach places.  If you need to reach something above you, use a strong step stool that has a grab bar.  Keep electrical cords out of the way.  Do not use floor polish or wax that makes floors slippery. If you must use wax, use non-skid floor wax.  Do not have throw rugs and other things on the floor that can make you trip. What can I do with my stairs?  Do not leave any items on the stairs.  Make sure that there are handrails on both sides of the stairs and use them. Fix handrails that are broken or loose. Make sure that handrails are as long as the stairways.  Check any carpeting to make sure that it is firmly attached to the stairs. Fix any carpet that is loose or worn.  Avoid having throw rugs at the top or bottom of the stairs. If you do have throw rugs, attach them to the floor with carpet tape.  Make sure that you have a light switch at the top of the stairs and the bottom of the stairs. If you do not have them, ask someone to add them for you. What else can I do to help prevent falls?  Wear shoes that: ? Do not have high heels. ? Have rubber bottoms. ? Are comfortable and fit you well. ? Are closed at the toe. Do not wear sandals.  If you  use a stepladder: ? Make sure that it is fully opened. Do not climb a closed stepladder. ? Make sure that both sides of the stepladder are locked into place. ? Ask someone to hold it for you, if possible.  Clearly mark and make sure that you can see: ? Any grab bars or handrails. ? First and last steps. ? Where the edge of each step is.  Use tools that help you move around (mobility aids) if they are needed. These include: ? Canes. ? Walkers. ? Scooters. ? Crutches.  Turn on the lights when you go into a dark area. Replace any light bulbs as soon as they burn out.  Set up your furniture so you have a clear path. Avoid moving your furniture around.  If any of your floors are uneven, fix them.  If there are any pets around you, be aware of where they are.  Review your medicines with your doctor. Some medicines can make you feel dizzy. This can increase your chance of falling. Ask your doctor what other things that you can do to help prevent falls. This information is not intended to replace advice given to you by your health care provider. Make sure you discuss any questions you have with your health care provider. Document Released: 06/12/2009 Document Revised: 01/22/2016 Document Reviewed: 09/20/2014 Elsevier Interactive Patient Education  [employer].

## 2017-04-29 ENCOUNTER — Other Ambulatory Visit (INDEPENDENT_AMBULATORY_CARE_PROVIDER_SITE_OTHER): Payer: Medicare Other

## 2017-04-29 DIAGNOSIS — D509 Iron deficiency anemia, unspecified: Secondary | ICD-10-CM

## 2017-04-29 LAB — CBC WITH DIFFERENTIAL/PLATELET
Basophils Absolute: 0 10*3/uL (ref 0.0–0.1)
Basophils Relative: 0.3 % (ref 0.0–3.0)
Eosinophils Absolute: 0.2 10*3/uL (ref 0.0–0.7)
Eosinophils Relative: 2.7 % (ref 0.0–5.0)
HCT: 33.1 % — ABNORMAL LOW (ref 36.0–46.0)
Hemoglobin: 10.4 g/dL — ABNORMAL LOW (ref 12.0–15.0)
Lymphocytes Relative: 20.7 % (ref 12.0–46.0)
Lymphs Abs: 1.4 10*3/uL (ref 0.7–4.0)
MCHC: 31.3 g/dL (ref 30.0–36.0)
MCV: 79.6 fl (ref 78.0–100.0)
Monocytes Absolute: 0.5 10*3/uL (ref 0.1–1.0)
Monocytes Relative: 7.8 % (ref 3.0–12.0)
Neutro Abs: 4.8 10*3/uL (ref 1.4–7.7)
Neutrophils Relative %: 68.5 % (ref 43.0–77.0)
Platelets: 298 10*3/uL (ref 150.0–400.0)
RBC: 4.15 Mil/uL (ref 3.87–5.11)
RDW: 15.2 % (ref 11.5–15.5)
WBC: 7 10*3/uL (ref 4.0–10.5)

## 2017-05-04 ENCOUNTER — Telehealth: Payer: Self-pay | Admitting: Family Medicine

## 2017-05-04 ENCOUNTER — Encounter: Payer: Self-pay | Admitting: Family Medicine

## 2017-05-04 NOTE — Telephone Encounter (Signed)
ROI fax to Dr. Jilda Panda

## 2017-05-06 ENCOUNTER — Telehealth: Payer: Self-pay | Admitting: *Deleted

## 2017-05-06 ENCOUNTER — Encounter: Payer: Self-pay | Admitting: Family Medicine

## 2017-05-06 ENCOUNTER — Ambulatory Visit (INDEPENDENT_AMBULATORY_CARE_PROVIDER_SITE_OTHER): Payer: Medicare Other | Admitting: Family Medicine

## 2017-05-06 VITALS — Temp 98.2°F | Wt 217.0 lb

## 2017-05-06 DIAGNOSIS — M25571 Pain in right ankle and joints of right foot: Secondary | ICD-10-CM

## 2017-05-06 NOTE — Patient Instructions (Addendum)
Please keep an eye on the skin of right leg- let me know if it becomes red, hot or draining  If ankle gets worse, please let me know   Ankle Sprain An ankle sprain is a stretch or tear in one of the tough tissues (ligaments) in your ankle. Follow these instructions at home:  Rest your ankle.  Take over-the-counter and prescription medicines only as told by your doctor.  For 2-3 days, keep your ankle higher than the level of your heart (elevated) as much as possible.  If directed, put ice on the area: ? Put ice in a plastic bag. ? Place a towel between your skin and the bag. ? Leave the ice on for 20 minutes, 2-3 times a day.  If you were given a brace: ? Wear it as told. ? Take it off to shower or bathe. ? Try not to move your ankle much, but wiggle your toes from time to time. This helps to prevent swelling.  If you were given an elastic bandage (dressing): ? Take it off when you shower or bathe. ? Try not to move your ankle much, but wiggle your toes from time to time. This helps to prevent swelling. ? Adjust the bandage to make it more comfortable if it feels too tight. ? Loosen the bandage if you lose feeling in your foot, your foot tingles, or your foot gets cold and blue.  If you have crutches, use them as told by your doctor. Continue to use them until you can walk without feeling pain in your ankle. Contact a doctor if:  Your bruises or swelling are quickly getting worse.  Your pain does not get better after you take medicine. Get help right away if:  You cannot feel your toes or foot.  Your toes or your foot looks blue.  You have very bad pain that gets worse. This information is not intended to replace advice given to you by your health care provider. Make sure you discuss any questions you have with your health care provider. Document Released: 02/02/2008 Document Revised: 01/22/2016 Document Reviewed: 03/18/2015 Elsevier Interactive Patient Education  [employer].

## 2017-05-06 NOTE — Telephone Encounter (Signed)
Daughter called in stating mother's lateral right ankle is red and swollen. Daughter states it is similar to the ankle swelling from her appointment in March with Debbie.  Daughter describes the swelling as such:  Size of redness: "Small like a nail head" Patient says it is: "painful"  Weeping? No Warm to touch? Not warm to touch / feels same as other extremities  Able to bare weight? Yes Raised or broken skin? Negative Systemic symptoms present: No   Daughter plans to arrange for mother to be seen today. Awaiting call back for appointment. Will plan for patient to see Jackelyn Poling if they are able to come in today.

## 2017-05-06 NOTE — Progress Notes (Signed)
Subjective:    Patient ID: Krista Clarke, female    DOB: November 19, 1940, 76 y.o.   MRN: [id number]  HPI This is a 76 yo female who presents today with left ankle pain x 2 days. She is accompanied by her daughter. She fell several days ago and ankle has been swollen and painful. She was reaching up to adjust her mini blinds when she fell "straight down." Some pain of right buttock and left shoulder initially, but these have improved. She denies hitting her head or any LOC. She had cellulitis of left ankle several months ago and was concerned that it had returned.  She denies chest pain, SOB, abdominal pain or bowel/bladder changes, dizziness or headaches.   Past Medical History:  Diagnosis Date  . Anemia    takes iron daily  . Arthritis   . Diabetes mellitus without complication ([place])   . Hypercholesteremia   . Hypertension    Past Surgical History:  Procedure Laterality Date  . ABDOMINAL HYSTERECTOMY    . COLONOSCOPY WITH PROPOFOL N/A 07/03/2013   Procedure: COLONOSCOPY WITH PROPOFOL;  Surgeon: Garlan Fair, MD;  Location: [facility];  Service: Endoscopy;  Laterality: N/A;  . EP IMPLANTABLE DEVICE N/A 08/20/2015   Procedure: Pacemaker Implant;  Surgeon: Evans Lance, MD;  Location: [facility];  Service: Cardiovascular;  Laterality: N/A;  . JOINT REPLACEMENT Bilateral    '99. Knees  . LEFT HEART CATHETERIZATION WITH CORONARY ANGIOGRAM N/A 08/27/2014   Procedure: LEFT HEART CATHETERIZATION WITH CORONARY ANGIOGRAM;  Surgeon: Laverda Page, MD;  Location: [facility] CATH LAB;  Service: Cardiovascular;  Laterality: N/A;  . TUBAL LIGATION     Family History  Problem Relation Age of Onset  . Hypertension Mother   . Diabetes Mother   . Hypertension Father   . Cancer Brother    Social History  Substance Use Topics  . Smoking status: Never Smoker  . Smokeless tobacco: Never Used  . Alcohol use No      Review of Systems Per HPI    Objective:   Physical Exam    Constitutional: She is oriented to person, place, and time. She appears well-developed and well-nourished.  HENT:  Head: Normocephalic and atraumatic.  Cardiovascular: Normal rate.   Pulmonary/Chest: Effort normal.  Musculoskeletal:       Right ankle: She exhibits swelling (mild, generalized). She exhibits normal range of motion, no ecchymosis and no deformity. Tenderness (generalized tenderness lateral malleolus and along lateral leg. ). Achilles tendon exhibits no pain and no defect.  Neurological: She is alert and oriented to person, place, and time.  Skin: Skin is warm and dry.  Vitals reviewed.     Temp 98.2 F (36.8 C) (Oral)   Wt 217 lb (98.4 kg)   SpO2 94%   BMI 38.44 kg/m  Wt Readings from Last 3 Encounters:  05/06/17 217 lb (98.4 kg)  04/28/17 220 lb 12.8 oz (100.2 kg)  03/25/17 217 lb 12.8 oz (98.8 kg)   BP Readings from Last 3 Encounters:  04/28/17 138/62  03/25/17 128/78  12/10/16 132/72       Assessment & Plan:  1. Acute right ankle pain - suspect mild sprain from fall - Provided written and verbal information regarding diagnosis and treatment. - wrapped with elastic bandage for comfort - discussed s/s to report to [place]- skin redness, warmth, texture change or drainage - she has pain medication if needed   Clarene Reamer, FNP-BC  [facility]  05/06/2017 4:12 PM

## 2017-05-06 NOTE — Telephone Encounter (Signed)
Patient scheduled for 3:30pm with Jackelyn Poling

## 2017-05-09 ENCOUNTER — Other Ambulatory Visit: Payer: Self-pay | Admitting: Family Medicine

## 2017-05-09 NOTE — Telephone Encounter (Signed)
Rec'd from Dr. Jilda Panda forwarded 24 pages to Briscoe Deutscher DO

## 2017-05-10 ENCOUNTER — Other Ambulatory Visit: Payer: Self-pay | Admitting: Family Medicine

## 2017-05-10 NOTE — Telephone Encounter (Signed)
MEDICATION: allopurinol (ZYLOPRIM) 100 MG tablet  PHARMACY:   [place], [place] - [address] [phone] (Phone) [phone] (Fax)   IS THIS A 90 DAY SUPPLY : yes  IS PATIENT OUT OF MEDICATION:yes  IF NOT; HOW MUCH IS LEFT: n/a  LAST APPOINTMENT DATE: @9 /02/2017  NEXT APPOINTMENT DATE:@11 /08/2016  OTHER COMMENTS: Patient is aware that Dr. Juleen China is out until Wednesday   **Let patient know to contact pharmacy at the end of the day to make sure medication is ready. **  ** Please notify patient to allow 48-72 hours to process**  **Encourage patient to contact the pharmacy for refills or they can request refills through [facility]**

## 2017-05-10 NOTE — Telephone Encounter (Signed)
Spoke to pt told her Rx was sent to pharmacy. Pt verbalized understanding. 

## 2017-05-16 ENCOUNTER — Telehealth: Payer: Self-pay | Admitting: Family Medicine

## 2017-05-16 DIAGNOSIS — E1165 Type 2 diabetes mellitus with hyperglycemia: Secondary | ICD-10-CM | POA: Diagnosis not present

## 2017-05-16 DIAGNOSIS — E118 Type 2 diabetes mellitus with unspecified complications: Secondary | ICD-10-CM | POA: Diagnosis not present

## 2017-05-16 DIAGNOSIS — I10 Essential (primary) hypertension: Secondary | ICD-10-CM | POA: Diagnosis not present

## 2017-05-16 DIAGNOSIS — Z950 Presence of cardiac pacemaker: Secondary | ICD-10-CM | POA: Diagnosis not present

## 2017-05-16 NOTE — Telephone Encounter (Signed)
[name] calling to check on notes below. Please call and advise.

## 2017-05-16 NOTE — Telephone Encounter (Signed)
Patient's mother called in reference to speaking with someone about patient's medication status on her HYDROcodone-acetaminophen (NORCO/VICODIN) 5-325 MG tablet. Pharmacy will not fill for 30 day supply unless it is filed as "chronic" instead of "moderate". Please call patient's daughter and advise. OK to leave message.

## 2017-05-16 NOTE — Telephone Encounter (Signed)
LM for patients daughter to return call  

## 2017-05-16 NOTE — Telephone Encounter (Signed)
Just want to confirm this is a chronic medication.

## 2017-05-16 NOTE — Telephone Encounter (Signed)
[name] [name] returning phone call. Call at [phone] to advise.

## 2017-05-17 DIAGNOSIS — Z950 Presence of cardiac pacemaker: Secondary | ICD-10-CM | POA: Diagnosis not present

## 2017-05-17 DIAGNOSIS — I441 Atrioventricular block, second degree: Secondary | ICD-10-CM | POA: Diagnosis not present

## 2017-05-17 DIAGNOSIS — Z45010 Encounter for checking and testing of cardiac pacemaker pulse generator [battery]: Secondary | ICD-10-CM | POA: Diagnosis not present

## 2017-05-17 NOTE — Telephone Encounter (Signed)
Okay to change to chronic.

## 2017-05-17 NOTE — Telephone Encounter (Signed)
[facility] is aware that this is a chronic medication and daughter is aware is voicemail that [facility] is aware.

## 2017-05-18 NOTE — Telephone Encounter (Signed)
Krista Clarke called to clarify the voicemail received about the chronic medication. I read to her the message that "Krista Clarke is aware that the medication is now listed as chronic so that they will fill." (per [facility]). Krista Clarke understood and will go pick up medication. No further action required.

## 2017-05-18 NOTE — Telephone Encounter (Signed)
Noted  

## 2017-05-19 ENCOUNTER — Telehealth: Payer: Self-pay | Admitting: Family Medicine

## 2017-05-19 NOTE — Telephone Encounter (Signed)
HYDROcodone-acetaminophen (NORCO/VICODIN) 5-325 MG tablet is not at her pharmacy.  [place]

## 2017-05-20 MED ORDER — HYDROCODONE-ACETAMINOPHEN 5-325 MG PO TABS: 1.0000 | ORAL_TABLET | Freq: Four times a day (QID) | ORAL | 0 refills | Status: DC | PRN

## 2017-05-20 NOTE — Telephone Encounter (Signed)
Please advise on refill.

## 2017-05-20 NOTE — Telephone Encounter (Signed)
Okay refill. 

## 2017-05-20 NOTE — Telephone Encounter (Signed)
Notified patients daughter that prescription is up front for her to pick up.

## 2017-05-20 NOTE — Telephone Encounter (Signed)
Daughter, [name] calling to pick up script for hydrocodone.   Wants to know if it's ready to be picked up?  Ty,  -LL

## 2017-06-10 ENCOUNTER — Telehealth: Payer: Self-pay | Admitting: Family Medicine

## 2017-06-10 NOTE — Telephone Encounter (Signed)
Please advise on refill. Historical provider.  

## 2017-06-10 NOTE — Telephone Encounter (Signed)
Phone was busy

## 2017-06-10 NOTE — Telephone Encounter (Signed)
MEDICATION: meclizine (ANTIVERT) 25 MG tablet  PHARMACY:   [place], [place] - [address] [phone] (Phone) [phone] (Fax)   IS THIS A 90 DAY SUPPLY : yes  IS PATIENT OUT OF MEDICATION: yes  IF NOT; HOW MUCH IS LEFT:   LAST APPOINTMENT DATE: @9 /02/2017  NEXT APPOINTMENT DATE:@11 /08/2016  OTHER COMMENTS:    **Let patient know to contact pharmacy at the end of the day to make sure medication is ready. **  ** Please notify patient to allow 48-72 hours to process**  **Encourage patient to contact the pharmacy for refills or they can request refills through [facility]**

## 2017-06-10 NOTE — Telephone Encounter (Signed)
Information please - I thought that we stopped that medication due to it giving her more issues with balance.

## 2017-06-13 ENCOUNTER — Other Ambulatory Visit: Payer: Self-pay

## 2017-06-13 MED ORDER — MECLIZINE HCL 25 MG PO TABS: 25.0000 mg | ORAL_TABLET | Freq: Two times a day (BID) | ORAL | 0 refills | Status: DC

## 2017-06-13 NOTE — Telephone Encounter (Signed)
Spoke with patient's sister.  Meclizine does not make patient have difficulty with balance.  Ok to send in refill?

## 2017-06-13 NOTE — Telephone Encounter (Signed)
Rx sent to pharmacy   

## 2017-06-13 NOTE — Telephone Encounter (Signed)
Patient sister Hiram Comber  called and is inquiring about a medication refill. Please see below message. Her contact number is [phone]. Please advise. Thank you
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
## 2017-06-13 NOTE — Telephone Encounter (Signed)
One refill to use sparingly. I think that she was using it daily at one time.

## 2017-06-15 DIAGNOSIS — E119 Type 2 diabetes mellitus without complications: Secondary | ICD-10-CM | POA: Diagnosis not present

## 2017-06-15 DIAGNOSIS — I10 Essential (primary) hypertension: Secondary | ICD-10-CM | POA: Diagnosis not present

## 2017-06-21 DIAGNOSIS — I10 Essential (primary) hypertension: Secondary | ICD-10-CM | POA: Diagnosis not present

## 2017-06-21 DIAGNOSIS — Z950 Presence of cardiac pacemaker: Secondary | ICD-10-CM | POA: Diagnosis not present

## 2017-06-21 DIAGNOSIS — R0789 Other chest pain: Secondary | ICD-10-CM | POA: Diagnosis not present

## 2017-06-21 DIAGNOSIS — E118 Type 2 diabetes mellitus with unspecified complications: Secondary | ICD-10-CM | POA: Diagnosis not present

## 2017-06-30 ENCOUNTER — Encounter: Payer: Self-pay | Admitting: Family Medicine

## 2017-06-30 ENCOUNTER — Ambulatory Visit (INDEPENDENT_AMBULATORY_CARE_PROVIDER_SITE_OTHER): Payer: Medicare Other | Admitting: Family Medicine

## 2017-06-30 VITALS — BP 138/82 | HR 71 | Temp 97.8°F | Ht 63.0 in | Wt 219.4 lb

## 2017-06-30 DIAGNOSIS — R600 Localized edema: Secondary | ICD-10-CM | POA: Diagnosis not present

## 2017-06-30 DIAGNOSIS — E669 Obesity, unspecified: Secondary | ICD-10-CM

## 2017-06-30 DIAGNOSIS — Z23 Encounter for immunization: Secondary | ICD-10-CM | POA: Diagnosis not present

## 2017-06-30 DIAGNOSIS — Z1239 Encounter for other screening for malignant neoplasm of breast: Secondary | ICD-10-CM

## 2017-06-30 DIAGNOSIS — Z1231 Encounter for screening mammogram for malignant neoplasm of breast: Secondary | ICD-10-CM

## 2017-06-30 DIAGNOSIS — E1169 Type 2 diabetes mellitus with other specified complication: Secondary | ICD-10-CM

## 2017-06-30 LAB — POCT GLYCOSYLATED HEMOGLOBIN (HGB A1C): Hemoglobin A1C: 7.8

## 2017-06-30 MED ORDER — POTASSIUM CHLORIDE CRYS ER 20 MEQ PO TBCR: 20.0000 meq | EXTENDED_RELEASE_TABLET | Freq: Every day | ORAL | 0 refills | Status: DC

## 2017-06-30 MED ORDER — FUROSEMIDE 20 MG PO TABS: 20.0000 mg | ORAL_TABLET | Freq: Every day | ORAL | 0 refills | Status: DC

## 2017-06-30 NOTE — Progress Notes (Signed)
Krista Clarke is a 76 y.o. female is here for follow up.  History of Present Illness:   HPI: See Assessment and Plan section for Problem Based Charting of issues discussed today.  Health Maintenance Due  Topic Date Due  . TETANUS/TDAP  08/30/1958  . PNA vac Low Risk Adult (1 of 2 - PCV13) 08/30/2004   Depression screen PHQ 2/9 04/28/2017 03/25/2017 10/01/2016  Decreased Interest 0 0 0  Down, Depressed, Hopeless 0 0 0  PHQ - 2 Score 0 0 0   PMHx, SurgHx, SocialHx, FamHx, Medications, and Allergies were reviewed in the Visit Navigator and updated as appropriate.   Patient Active Problem List   Diagnosis Date Noted  . LBBB (left bundle branch block) 11/18/2016  . Pacemaker 11/18/2016  . PAD (peripheral artery disease) ([place]) 11/18/2016  . 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 Chronic midline low back pain with bilateral sciatica 10/02/2016  . Paresthesia of both lower extremities 10/02/2016  . Obesity, Class II, BMI 35-39.9, with comorbidity 10/02/2016  . HLD (hyperlipidemia) 10/02/2016  . Essential hypertension 03/08/2016  . Anemia, iron deficiency 03/08/2016  . Diabetes mellitus type 2 in obese ([place]) 03/08/2016  . Complete heart block ([place]) 08/20/2015  . Angina pectoris ([place]) 08/26/2014   Social History  Substance Use Topics  . Smoking status: Never Smoker  . Smokeless tobacco: Never Used  . Alcohol use No   Current Medications and Allergies:   .  acetaminophen (TYLENOL) 500 MG tablet, Take 1,000 mg by mouth every 6 (six) hours as needed. Reported on 08/20/2015, Disp: , Rfl:  .  allopurinol (ZYLOPRIM) 100 MG tablet, TAKE 1 TABLET BY MOUTH EVERY MORNING, Disp: 90 tablet, Rfl: 0 .  aspirin 81 MG chewable tablet, Chew by mouth daily., Disp: , Rfl:  .  atorvastatin (LIPITOR) 40 MG tablet, Take 40 mg by mouth daily. , Disp: , Rfl:  .  Calcium Citrate-Vitamin D (CALCIUM CITRATE + D PO), Take 1 tablet by mouth daily., Disp: , Rfl:  .  ferrous sulfate 325 (65 FE) MG EC  tablet, Take 325 mg by mouth 2 (two) times daily., Disp: , Rfl:  .  gabapentin (NEURONTIN) 100 MG capsule, Take 2 capsules (200 mg total) by mouth 3 (three) times daily., Disp: 180 capsule, Rfl: 3 .  hydrochlorothiazide (MICROZIDE) 12.5 MG capsule, Take 1 capsule (12.5 mg total) by mouth daily., Disp: 90 capsule, Rfl: 1 .  HYDROcodone-acetaminophen (NORCO/VICODIN) 5-325 MG tablet, Take 1 tablet by mouth 4 (four) times daily as needed (Chronic Pain)., Disp: 120 tablet, Rfl: 0 .  irbesartan-hydrochlorothiazide (AVALIDE) 150-12.5 MG tablet, Take 1 tablet by mouth daily., Disp: 30 tablet, Rfl: 11 .  Lutein 20 MG TABS, Take by mouth daily. , Disp: , Rfl:  .  magnesium oxide (MAG-OX) 400 MG tablet, Take 400 mg by mouth daily., Disp: , Rfl:  .  meclizine (ANTIVERT) 25 MG tablet, Take 1 tablet (25 mg total) by mouth 2 (two) times daily. Take no more than 4 times per week., Disp: 60 tablet, Rfl: 0 .  metFORMIN (GLUCOPHAGE) 1000 MG tablet, Take 1 tablet (1,000 mg total) by mouth 2 (two) times daily with a meal., Disp: 60 tablet, Rfl: 11 .  metoprolol tartrate (LOPRESSOR) 50 MG tablet, , Disp: , Rfl:  .  nitroGLYCERIN (NITROSTAT) 0.4 MG SL tablet, Place 0.4 mg under the tongue every 5 (five) minutes as needed for chest pain., Disp: , Rfl:  .  vitamin C (ASCORBIC ACID) 500 MG tablet, Take  500 mg by mouth daily., Disp: , Rfl:  .  Vorapaxar Sulfate (ZONTIVITY) 2.08 MG TABS, Take 1 tablet by mouth daily., Disp: 30 tablet, Rfl: 0 .  amLODipine (NORVASC) 10 MG tablet, , Disp: , Rfl:  .  furosemide (LASIX) 20 MG tablet, Take 1 tablet (20 mg total) by mouth daily., Disp: 20 tablet, Rfl: 0 .  potassium chloride SA (K-DUR,KLOR-CON) 20 MEQ tablet, Take 1 tablet (20 mEq total) by mouth daily., Disp: 20 tablet, Rfl: 0   Allergies  Allergen Reactions  . Prednisone Palpitations and Other (See Comments)    Doesn't work well for patient   Review of Systems   Pertinent items are noted in the HPI. Otherwise, ROS is  negative.  Vitals:   Vitals:   06/30/17 1015  BP: 138/82  Pulse: 71  Temp: 97.8 F (36.6 C)  TempSrc: Oral  SpO2: 100%  Weight: 219 lb 6.4 oz (99.5 kg)  Height: 5\' 3"  (1.6 m)     Body mass index is 38.86 kg/m.   Physical Exam:   Physical Exam  Constitutional: She appears well-nourished.  HENT:  Head: Normocephalic and atraumatic.  Eyes: Pupils are equal, round, and reactive to light. EOM are normal.  Neck: Normal range of motion. Neck supple.  Cardiovascular: Normal rate, regular rhythm, normal heart sounds and intact distal pulses.   Pulmonary/Chest: Effort normal.  Abdominal: Soft.  Skin: Skin is warm.  Psychiatric: She has a normal mood and affect. Her behavior is normal.  Nursing note and vitals reviewed.   Results for orders placed or performed in visit on 06/30/17  POCT glycosylated hemoglobin (Hb A1C)  Result Value Ref Range   Hemoglobin A1C 7.8    Assessment and Plan:   Krista Clarke was seen today for follow-up.  Diagnoses and all orders for this visit:  Diabetes mellitus type 2 in obese [facility]) Comments: Improving. Current symptoms: no polyuria or polydipsia, no chest pain, dyspnea or TIA's.  Maintaining a diabetic diet? [x]   YES  []   NO Trying to exercise on a regular basis? []   YES  [x]   NO  On ACE inhibitor or angiotensin II receptor blocker? [x]   YES  []   NO On Aspirin? [x]   YES  []   NO  Lab Results  Component Value Date   HGBA1C 7.8 06/30/2017    No results found for: Derl Barrow  Lab Results  Component Value Date   CHOL 122 03/25/2017   HDL 55.30 03/25/2017   LDLCALC 44 03/25/2017   TRIG 113.0 03/25/2017   CHOLHDL 2 03/25/2017     Wt Readings from Last 3 Encounters:  06/30/17 219 lb 6.4 oz (99.5 kg)  05/06/17 217 lb (98.4 kg)  04/28/17 220 lb 12.8 oz (100.2 kg)   BP Readings from Last 3 Encounters:  06/30/17 138/82  04/28/17 138/62  03/25/17 128/78   Lab Results  Component Value Date   CREATININE 1.04 03/25/2017    Orders: -     POCT glycosylated hemoglobin (Hb A1C)  Breast cancer screening -     MM SCREENING BREAST TOMO BILATERAL; Future  Encounter for immunization -     Flu vaccine HIGH DOSE PF  Bilateral lower extremity edema Comments: NEW Mild to moderate. Intermittent. Made better with leg elevation.  Orders: -     furosemide (LASIX) 20 MG tablet; Take 1 tablet (20 mg total) by mouth daily. -     potassium chloride SA (K-DUR,KLOR-CON) 20 MEQ tablet; Take 1 tablet (20 mEq total) by mouth  daily.   . Reviewed expectations re: course of current medical issues. . Discussed self-management of symptoms. . Outlined signs and symptoms indicating need for more acute intervention. . Patient verbalized understanding and all questions were answered. Marland Kitchen Health Maintenance issues including appropriate healthy diet, exercise, and smoking avoidance were discussed with patient. . See orders for this visit as documented in the electronic medical record. . Patient received an After Visit Summary.  Briscoe Deutscher, DO [place], [place] 07/02/2017  Future Appointments Date Time Provider [place]  08/08/2017 11:00 AM [facility] MM 2 [facility] CE  10/04/2017 11:20 AM Briscoe Deutscher, DO [facility] None  06/22/2018 10:00 AM Williemae Area, RN [facility] None  06/22/2018 11:00 AM [facility] LAB [facility] None  06/29/2018 10:00 AM Briscoe Deutscher, DO [facility] None

## 2017-07-11 ENCOUNTER — Other Ambulatory Visit: Payer: Self-pay

## 2017-07-11 ENCOUNTER — Telehealth: Payer: Self-pay | Admitting: Family Medicine

## 2017-07-11 MED ORDER — HYDROCODONE-ACETAMINOPHEN 5-325 MG PO TABS
1.0000 | ORAL_TABLET | Freq: Four times a day (QID) | ORAL | 0 refills | Status: DC | PRN
Start: 2017-07-11 — End: 2017-08-19

## 2017-07-11 NOTE — Telephone Encounter (Signed)
Okay refill if not too early.

## 2017-07-11 NOTE — Telephone Encounter (Signed)
Rx printed and placed up front for pick up.

## 2017-07-11 NOTE — Telephone Encounter (Signed)
MEDICATION: hydrocodone-acetaminophen  PHARMACY:    IS THIS A 90 DAY SUPPLY : no  IS PATIENT OUT OF MEDICATION: yes  IF NOT; HOW MUCH IS LEFT:   LAST APPOINTMENT DATE: @11 /08/2016  NEXT APPOINTMENT DATE:@2 /12/2017  OTHER COMMENTS: Patient aware she will come into office to pick up script.   **Let patient know to contact pharmacy at the end of the day to make sure medication is ready. **  ** Please notify patient to allow 48-72 hours to process**  **Encourage patient to contact the pharmacy for refills or they can request refills through [facility]**

## 2017-07-11 NOTE — Telephone Encounter (Signed)
Please advise on refill.

## 2017-07-26 ENCOUNTER — Telehealth: Payer: Self-pay

## 2017-07-26 DIAGNOSIS — R600 Localized edema: Secondary | ICD-10-CM

## 2017-07-26 NOTE — Telephone Encounter (Signed)
Copied from [place] #[id number]. Topic: Quick Communication - See Telephone Encounter >> Jul 26, 2017  1:54 PM Hewitt Shorts wrote: CRM for notification. See Telephone encounter for: pt is wanting to know if dr Juleen China still wants her on the follow medsications because she did not send in for refills   Potassium, furosemide, varapazar sulfate   Best number to call [phone]  07/26/17.

## 2017-07-27 NOTE — Telephone Encounter (Signed)
Mail Box full.

## 2017-08-01 NOTE — Telephone Encounter (Signed)
Spoke with patient and she stated that she is out of the Lasix and potassium. She stated that her legs was better when she was on this and then when she stopped taking the swelling came back. She would also like a refill on the Vorapaxar.

## 2017-08-02 MED ORDER — POTASSIUM CHLORIDE CRYS ER 20 MEQ PO TBCR: 20.0000 meq | EXTENDED_RELEASE_TABLET | Freq: Every day | ORAL | 0 refills | Status: DC

## 2017-08-02 MED ORDER — VORAPAXAR SULFATE 2.08 MG PO TABS: 1.0000 | ORAL_TABLET | Freq: Every day | ORAL | 0 refills | Status: DC

## 2017-08-02 MED ORDER — FUROSEMIDE 20 MG PO TABS: 20.0000 mg | ORAL_TABLET | Freq: Every day | ORAL | 0 refills | Status: DC

## 2017-08-02 NOTE — Telephone Encounter (Signed)
Notified patient that prescriptions have been sent to the pharmacy.

## 2017-08-02 NOTE — Telephone Encounter (Signed)
Okay refill Lasix/K. Okay Vorapaxar.

## 2017-08-03 ENCOUNTER — Telehealth: Payer: Self-pay

## 2017-08-03 NOTE — Telephone Encounter (Signed)
PA for Zontivity is in progress through Cover My Meds.  Awaiting insurance decision.

## 2017-08-04 ENCOUNTER — Telehealth: Payer: Self-pay

## 2017-08-04 NOTE — Telephone Encounter (Signed)
Is there something else that she can try?

## 2017-08-04 NOTE — Telephone Encounter (Signed)
Pt called to check on status of this Pt is asking if there is a med she can take in place of zontivity  that her insurance will pay for, she called pharmacy today and was told they have not received any rx

## 2017-08-04 NOTE — Telephone Encounter (Signed)
Patient's insurance required prior authorization of her Zontivity (even though it looks like patient has been taking it since February).  PA was denied, specifically because Medicare wants to know the specific medical reasons the patient cannot take Plavix.

## 2017-08-04 NOTE — Telephone Encounter (Signed)
Please call in [place] 75 mg P.O. daily. #90. She will likely need to contact her Cardiologist to get continued if needed.

## 2017-08-05 MED ORDER — CLOPIDOGREL BISULFATE 75 MG PO TABS: 75.0000 mg | ORAL_TABLET | Freq: Every day | ORAL | 0 refills | Status: DC

## 2017-08-05 NOTE — Telephone Encounter (Signed)
Notified patient that prescription has been sent to the pharmacy. Patient verbalized understanding.

## 2017-08-08 ENCOUNTER — Ambulatory Visit: Payer: Medicare Other

## 2017-08-16 DIAGNOSIS — Z45018 Encounter for adjustment and management of other part of cardiac pacemaker: Secondary | ICD-10-CM | POA: Diagnosis not present

## 2017-08-16 DIAGNOSIS — Z8679 Personal history of other diseases of the circulatory system: Secondary | ICD-10-CM | POA: Diagnosis not present

## 2017-08-16 DIAGNOSIS — Z950 Presence of cardiac pacemaker: Secondary | ICD-10-CM | POA: Diagnosis not present

## 2017-08-17 ENCOUNTER — Other Ambulatory Visit: Payer: Self-pay | Admitting: Family Medicine

## 2017-08-17 ENCOUNTER — Telehealth: Payer: Self-pay | Admitting: Family Medicine

## 2017-08-17 NOTE — Telephone Encounter (Signed)
Ok to refill 

## 2017-08-17 NOTE — Telephone Encounter (Signed)
Okay 

## 2017-08-17 NOTE — Telephone Encounter (Signed)
Please see refill request.

## 2017-08-17 NOTE — Telephone Encounter (Signed)
Refill request sent to provider.

## 2017-08-17 NOTE — Telephone Encounter (Signed)
Copied from [place]. Topic: Quick Communication - Rx Refill/Question >> Aug 17, 2017 11:20 AM Scherrie Gerlach wrote: Pt request refill   HYDROcodone-acetaminophen (NORCO/VICODIN) 5-325 MG tablet

## 2017-08-18 NOTE — Telephone Encounter (Signed)
Has this refill request been filled? Please advise.   Copied from [place]. Topic: Quick Communication - Rx Refill/Question >> Aug 17, 2017 11:20 AM Scherrie Gerlach wrote: Pt request refill   HYDROcodone-acetaminophen (NORCO/VICODIN) 5-325 MG tablet Pt states she will be out in 2 days

## 2017-08-18 NOTE — Telephone Encounter (Signed)
Please send note

## 2017-08-18 NOTE — Telephone Encounter (Signed)
Okay refill. Please call her to let her know that it is ready. I believe that her daughter will be picking up the Rx. Note: apparently, someone was rude to her on the phone. Please apologize to her. EW

## 2017-08-19 ENCOUNTER — Other Ambulatory Visit: Payer: Self-pay

## 2017-08-19 MED ORDER — HYDROCODONE-ACETAMINOPHEN 5-325 MG PO TABS: 1.0000 | ORAL_TABLET | Freq: Four times a day (QID) | ORAL | 0 refills | Status: DC | PRN

## 2017-08-19 NOTE — Telephone Encounter (Signed)
I have called patient let her know script will be here for pick up. I also let her know that we were very sorry for the way that the situation was handled and we are working on some training for the [facility] to make sure this is not issue for her or other patient in the future.

## 2017-08-22 DIAGNOSIS — N819 Female genital prolapse, unspecified: Secondary | ICD-10-CM | POA: Diagnosis not present

## 2017-09-06 ENCOUNTER — Ambulatory Visit
Admission: RE | Admit: 2017-09-06 | Discharge: 2017-09-06 | Disposition: A | Discharge status: Home / Self Care | Payer: Medicare Other | Source: Ambulatory Visit | Attending: Family Medicine | Admitting: Family Medicine

## 2017-09-06 DIAGNOSIS — Z23 Encounter for immunization: Secondary | ICD-10-CM

## 2017-09-06 DIAGNOSIS — Z1231 Encounter for screening mammogram for malignant neoplasm of breast: Secondary | ICD-10-CM | POA: Diagnosis not present

## 2017-09-06 DIAGNOSIS — Z1239 Encounter for other screening for malignant neoplasm of breast: Secondary | ICD-10-CM

## 2017-09-19 ENCOUNTER — Other Ambulatory Visit: Payer: Self-pay | Admitting: Family Medicine

## 2017-09-19 DIAGNOSIS — I10 Essential (primary) hypertension: Secondary | ICD-10-CM | POA: Diagnosis not present

## 2017-09-19 DIAGNOSIS — E119 Type 2 diabetes mellitus without complications: Secondary | ICD-10-CM | POA: Diagnosis not present

## 2017-09-19 NOTE — Telephone Encounter (Signed)
See note

## 2017-09-19 NOTE — Telephone Encounter (Signed)
Please advise on refill.

## 2017-09-19 NOTE — Telephone Encounter (Signed)
Copied from [place] [phone]. Topic: Quick Communication - Rx Refill/Question >> Sep 19, 2017 12:19 PM Carolyn Stare wrote: Medication     HYDROcodone-acetaminophen (NORCO/VICODIN) 5-325 MG tablet   Pick up   [phone]

## 2017-09-20 NOTE — Telephone Encounter (Signed)
Okay refill. 

## 2017-09-21 MED ORDER — HYDROCODONE-ACETAMINOPHEN 5-325 MG PO TABS: 1.0000 | ORAL_TABLET | Freq: Four times a day (QID) | ORAL | 0 refills | Status: DC | PRN

## 2017-09-21 NOTE — Telephone Encounter (Signed)
Prescription printed and put up front. Patient notified.

## 2017-09-23 DIAGNOSIS — I10 Essential (primary) hypertension: Secondary | ICD-10-CM | POA: Diagnosis not present

## 2017-09-23 DIAGNOSIS — E119 Type 2 diabetes mellitus without complications: Secondary | ICD-10-CM | POA: Diagnosis not present

## 2017-09-28 ENCOUNTER — Telehealth: Payer: Self-pay | Admitting: Family Medicine

## 2017-09-28 NOTE — Telephone Encounter (Signed)
See note

## 2017-09-28 NOTE — Telephone Encounter (Signed)
Spoke with Ms. Spayd and she stated that the irbesartan has been recalled so the pharmacy told her not to take the medication. I explained that anytime she stops taking a medication to please let the doctor know so we can send in something else. Patient denies chest pain, shortness of breath, headache or blurred vision. Patient has an appointment on 10/04/17

## 2017-09-28 NOTE — Telephone Encounter (Signed)
Left message for Clarise Cruz NP  at [facility] Call to return call.

## 2017-09-28 NOTE — Telephone Encounter (Signed)
Copied from [place]. Topic: General - Other >> Sep 28, 2017 11:51 AM Lolita Rieger, RMA wrote: Reason for CRM: 160/90 B/P bilateral edema for 2 weeks pt had stopped B/P medication only taking amlodipine [phone] Frankey Poot

## 2017-09-28 NOTE — Telephone Encounter (Signed)
Spoke with Clarise Cruz from [employer]. She stated that she came out for patient yearly wellness from her insurance. She stated that the patient stopped taking her BP medication irbesartan-hydrochlorothiazide (AVALIDE) 150-12.5 MG tablet due to recall. Nurse just wanted to let us know what was going on with the patient. She stated that the patient was not in any distress at this time. She was just having the swelling in the legs.   Tried to call Ms. Smola to get her scheduled to come in to see Dr. Juleen China. Left message for patient to call the office back.

## 2017-10-04 ENCOUNTER — Encounter: Payer: Self-pay | Admitting: Family Medicine

## 2017-10-04 ENCOUNTER — Ambulatory Visit (INDEPENDENT_AMBULATORY_CARE_PROVIDER_SITE_OTHER): Payer: Medicare Other | Admitting: Family Medicine

## 2017-10-04 VITALS — BP 138/68 | HR 77 | Temp 97.6°F | Ht 63.0 in | Wt 221.0 lb

## 2017-10-04 DIAGNOSIS — L4050 Arthropathic psoriasis, unspecified: Secondary | ICD-10-CM

## 2017-10-04 DIAGNOSIS — R262 Difficulty in walking, not elsewhere classified: Secondary | ICD-10-CM | POA: Diagnosis not present

## 2017-10-04 DIAGNOSIS — I10 Essential (primary) hypertension: Secondary | ICD-10-CM | POA: Diagnosis not present

## 2017-10-04 DIAGNOSIS — E669 Obesity, unspecified: Secondary | ICD-10-CM | POA: Diagnosis not present

## 2017-10-04 DIAGNOSIS — Z79899 Other long term (current) drug therapy: Secondary | ICD-10-CM | POA: Diagnosis not present

## 2017-10-04 DIAGNOSIS — E1159 Type 2 diabetes mellitus with other circulatory complications: Secondary | ICD-10-CM

## 2017-10-04 DIAGNOSIS — I739 Peripheral vascular disease, unspecified: Secondary | ICD-10-CM

## 2017-10-04 DIAGNOSIS — I442 Atrioventricular block, complete: Secondary | ICD-10-CM

## 2017-10-04 DIAGNOSIS — E1169 Type 2 diabetes mellitus with other specified complication: Secondary | ICD-10-CM

## 2017-10-04 DIAGNOSIS — E538 Deficiency of other specified B group vitamins: Secondary | ICD-10-CM

## 2017-10-04 DIAGNOSIS — R600 Localized edema: Secondary | ICD-10-CM | POA: Diagnosis not present

## 2017-10-04 DIAGNOSIS — Z87440 Personal history of urinary (tract) infections: Secondary | ICD-10-CM

## 2017-10-04 DIAGNOSIS — I152 Hypertension secondary to endocrine disorders: Secondary | ICD-10-CM

## 2017-10-04 LAB — COMPREHENSIVE METABOLIC PANEL
ALT: 10 U/L (ref 0–35)
AST: 11 U/L (ref 0–37)
Albumin: 4.1 g/dL (ref 3.5–5.2)
Alkaline Phosphatase: 74 U/L (ref 39–117)
BUN: 24 mg/dL — ABNORMAL HIGH (ref 6–23)
CO2: 27 mEq/L (ref 19–32)
Calcium: 9.5 mg/dL (ref 8.4–10.5)
Chloride: 101 mEq/L (ref 96–112)
Creatinine, Ser: 0.97 mg/dL (ref 0.40–1.20)
GFR: 71.41 mL/min (ref >60.00)
Glucose, Bld: 161 mg/dL — ABNORMAL HIGH (ref 70–99)
Potassium: 4.4 mEq/L (ref 3.5–5.1)
Sodium: 138 mEq/L (ref 135–145)
Total Bilirubin: 0.4 mg/dL (ref 0.2–1.2)
Total Protein: 7.5 g/dL (ref 6.0–8.3)

## 2017-10-04 LAB — URINALYSIS, ROUTINE W REFLEX MICROSCOPIC
Bilirubin Urine: NEGATIVE
Hgb urine dipstick: NEGATIVE
Nitrite: NEGATIVE
RBC / HPF: NONE SEEN (ref 0–?)
Specific Gravity, Urine: 1.015 (ref 1.000–1.030)
Total Protein, Urine: NEGATIVE
Urine Glucose: NEGATIVE
Urobilinogen, UA: 0.2 (ref 0.0–1.0)
pH: 6 (ref 5.0–8.0)

## 2017-10-04 LAB — CBC WITH DIFFERENTIAL/PLATELET
Basophils Absolute: 0 10*3/uL (ref 0.0–0.1)
Basophils Relative: 0.4 % (ref 0.0–3.0)
Eosinophils Absolute: 0.1 10*3/uL (ref 0.0–0.7)
Eosinophils Relative: 2.2 % (ref 0.0–5.0)
HCT: 31.8 % — ABNORMAL LOW (ref 36.0–46.0)
Hemoglobin: 10 g/dL — ABNORMAL LOW (ref 12.0–15.0)
Lymphocytes Relative: 17.5 % (ref 12.0–46.0)
Lymphs Abs: 1 10*3/uL (ref 0.7–4.0)
MCHC: 31.5 g/dL (ref 30.0–36.0)
MCV: 78.3 fl (ref 78.0–100.0)
Monocytes Absolute: 0.5 10*3/uL (ref 0.1–1.0)
Monocytes Relative: 8.5 % (ref 3.0–12.0)
Neutro Abs: 4.1 10*3/uL (ref 1.4–7.7)
Neutrophils Relative %: 71.4 % (ref 43.0–77.0)
Platelets: 260 10*3/uL (ref 150.0–400.0)
RBC: 4.07 Mil/uL (ref 3.87–5.11)
RDW: 15.4 % (ref 11.5–15.5)
WBC: 5.8 10*3/uL (ref 4.0–10.5)

## 2017-10-04 LAB — HEMOGLOBIN A1C: Hgb A1c MFr Bld: 8.4 % — ABNORMAL HIGH (ref 4.6–6.5)

## 2017-10-04 LAB — VITAMIN B12: Vitamin B-12: 500 pg/mL (ref 211–911)

## 2017-10-04 MED ORDER — LOSARTAN POTASSIUM-HCTZ 50-12.5 MG PO TABS
1.0000 | ORAL_TABLET | Freq: Every day | ORAL | 3 refills | Status: DC
Start: 2017-10-04 — End: 2018-05-12

## 2017-10-04 NOTE — Progress Notes (Signed)
Krista Clarke is a 77 y.o. female is here for follow up.  History of Present Illness:   Krista Clarke CMA acting as scribe for Dr. Juleen China.  HPI: Patient comes in today because her blood pressure medication has been recalled. Her home nurse called to let us know that patients medication was recalled and she had stopped taking for the last couple months. Her blood pressure the day of the visit was elevated. She is having some leg edema and it is hard for her to walk. Some tenderness with anything touching her legs. We will change the medication today. During exam can hear some fluid in the lungs.   Home health: Patient would like to have someone come out to help. We will place a referral to [place] today. Patient denies any falls at home. Discussed life alert for home.   Bladder drop: Patient was seen at [facility] facility for bladder dropping. The have scheduled her with a specialist to be seen. She goes the end of February for this.  We have given patient Dr. Alcario Drought card to have records sent to her.   Health Maintenance Due  Topic Date Due  . TETANUS/TDAP  08/30/1958  . PNA vac Low Risk Adult (1 of 2 - PCV13) 08/30/2004   Depression screen PHQ 2/9 04/28/2017 03/25/2017 10/01/2016  Decreased Interest 0 0 0  Down, Depressed, Hopeless 0 0 0  PHQ - 2 Score 0 0 0   PMHx, SurgHx, SocialHx, FamHx, Medications, and Allergies were reviewed in the Visit Navigator and updated as appropriate.   Patient Active Problem List   Diagnosis Date Noted  . LBBB (left bundle branch block) 11/18/2016  . Pacemaker 11/18/2016  . PAD (peripheral artery disease) ([place]) 11/18/2016  . 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 Chronic midline low back pain with bilateral sciatica 10/02/2016  . Paresthesia of both lower extremities 10/02/2016  . Obesity, Class II, BMI 35-39.9, with comorbidity 10/02/2016  . HLD (hyperlipidemia) 10/02/2016  . Hypertension associated with diabetes ([place]) 03/08/2016  .  Anemia, iron deficiency 03/08/2016  . Diabetes mellitus type 2 in obese ([place]) 03/08/2016  . Complete heart block ([place]) 08/20/2015  . Angina pectoris ([place]) 08/26/2014   Social History   Tobacco Use  . Smoking status: Never Smoker  . Smokeless tobacco: Never Used  Substance Use Topics  . Alcohol use: No  . Drug use: No   Current Medications and Allergies:   .  allopurinol (ZYLOPRIM) 100 MG tablet, TAKE 1 TABLET BY MOUTH EVERY MORNING, Disp: 90 tablet, Rfl: 0 .  amLODipine (NORVASC) 10 MG tablet, , Disp: , Rfl:  .  aspirin 81 MG chewable tablet, Chew by mouth daily., Disp: , Rfl:  .  atorvastatin (LIPITOR) 40 MG tablet, Take 40 mg by mouth daily. , Disp: , Rfl:  .  Calcium Citrate-Vitamin D (CALCIUM CITRATE + D PO), Take 1 tablet by mouth daily., Disp: , Rfl:  .  clopidogrel (PLAVIX) 75 MG tablet, Take 1 tablet (75 mg total) by mouth daily., Disp: 90 tablet, Rfl: 0 .  ferrous sulfate 325 (65 FE) MG EC tablet, Take 325 mg by mouth 2 (two) times daily., Disp: , Rfl:  .  furosemide (LASIX) 20 MG tablet, Take 1 tablet (20 mg total) by mouth daily., Disp: 20 tablet, Rfl: 0 .  gabapentin (NEURONTIN) 100 MG capsule, TAKE 2 CAPSULES BY MOUTH THREE TIMES DAILY, Disp: 180 capsule, Rfl: 3 .  hydrochlorothiazide (MICROZIDE) 12.5 MG capsule, TAKE 1 CAPSULE BY  MOUTH  DAILY, Disp: 90 capsule, Rfl: 1 .  HYDROcodone-acetaminophen (NORCO/VICODIN) 5-325 MG tablet, Take 1 tablet by mouth 4 (four) times daily as needed (Chronic Pain)., Disp: 120 tablet, Rfl: 0 .  Lutein 20 MG TABS, Take by mouth daily. , Disp: , Rfl:  .  magnesium oxide (MAG-OX) 400 MG tablet, Take 400 mg by mouth daily., Disp: , Rfl:  .  meclizine (ANTIVERT) 25 MG tablet, Take 1 tablet (25 mg total) by mouth 2 (two) times daily. Take no more than 4 times per week., Disp: 60 tablet, Rfl: 0 .  metFORMIN (GLUCOPHAGE) 1000 MG tablet, Take 1 tablet (1,000 mg total) by mouth 2 (two) times daily with a meal., Disp: 60 tablet, Rfl: 11 .  metoprolol  tartrate (LOPRESSOR) 50 MG tablet, , Disp: , Rfl:  .  nitroGLYCERIN (NITROSTAT) 0.4 MG SL tablet, Place 0.4 mg under the tongue every 5 (five) minutes as needed for chest pain., Disp: , Rfl:  .  potassium chloride SA (K-DUR,KLOR-CON) 20 MEQ tablet, Take 1 tablet (20 mEq total) by mouth daily., Disp: 20 tablet, Rfl: 0 .  vitamin C (ASCORBIC ACID) 500 MG tablet, Take 500 mg by mouth daily., Disp: , Rfl:  .  Vorapaxar Sulfate (ZONTIVITY) 2.08 MG TABS, Take 1 tablet by mouth daily., Disp: 30 tablet, Rfl: 0   Allergies  Allergen Reactions  . Prednisone Palpitations and Other (See Comments)    Doesn't work well for patient   Review of Systems   Pertinent items are noted in the HPI. Otherwise, ROS is negative.  Vitals:   Vitals:   10/04/17 1127  Weight: 221 lb (100.2 kg)  Height: 5\' 3"  (1.6 m)     Body mass index is 39.15 kg/m.  Physical Exam:   Physical Exam  Constitutional: She is oriented to person, place, and time. She appears well-developed and well-nourished. No distress.  HENT:  Head: Normocephalic and atraumatic.  Right Ear: External ear normal.  Left Ear: External ear normal.  Nose: Nose normal.  Mouth/Throat: Oropharynx is clear and moist.  Eyes: EOM are normal. Pupils are equal, round, and reactive to light.  Neck: Normal range of motion. Neck supple. No thyromegaly present.  Cardiovascular: Normal rate, regular rhythm, normal heart sounds and intact distal pulses.  Pulmonary/Chest: Effort normal.  Mild bilateral lower lobe crackles.  Abdominal: Soft. Bowel sounds are normal.  Musculoskeletal: She exhibits edema.  Neurological: She is alert and oriented to person, place, and time.  Skin: Skin is warm.  Psychiatric: She has a normal mood and affect. Her behavior is normal.  Nursing note and vitals reviewed.   Assessment and Plan:   1. Hypertension associated with diabetes ([place]) Avoiding excessive salt intake? []   YES  [x]   NO Trying to exercise on a regular  basis? []   YES  [x]   NO Review: not taking medications regularly as instructed, home BP monitoring in range of 433I systolic over 95J diastolic, no TIAs, no chest pain on exertion, no dyspnea on exertion, notes stable dyspnea on exertion, no change, noting swelling of ankles.  Smoker: No.  Wt Readings from Last 3 Encounters:  10/04/17 221 lb (100.2 kg)  06/30/17 219 lb 6.4 oz (99.5 kg)  05/06/17 217 lb (98.4 kg)   BP Readings from Last 3 Encounters:  10/04/17 138/68  06/30/17 138/82  04/28/17 138/62   Lab Results  Component Value Date   CREATININE 0.97 10/04/2017   - Comprehensive metabolic panel - losartan-hydrochlorothiazide (HYZAAR) 50-12.5 MG tablet; Take 1  tablet by mouth daily.  Dispense: 90 tablet; Refill: 3  2. Diabetes mellitus type 2 in obese [facility]) Current symptoms: no polyuria or polydipsia, no chest pain, dyspnea or TIA's.  Taking medication compliantly without noted sided effects [x]   YES  []   NO  Episodes of hypoglycemia? []   YES  [x]   NO Maintaining a diabetic diet? []   YES  [x]   NO Trying to exercise on a regular basis? []   YES  [x]   NO  On ACE inhibitor or angiotensin II receptor blocker? []   YES  [x]   NO On Aspirin? [x]   YES  []   NO  Lab Results  Component Value Date   HGBA1C 8.4 (H) 10/04/2017     - Comprehensive metabolic panel - Hemoglobin A1c  3. B12 deficiency  Lab Results  Component Value Date   WBC 5.8 10/04/2017   HGB 10.0 (L) 10/04/2017   HCT 31.8 (L) 10/04/2017   MCV 78.3 10/04/2017   PLT 260.0 10/04/2017   - CBC with Differential/Platelet - B12  4. Bilateral lower extremity edema See above.  5. History of UTI - Urinalysis, Routine w reflex microscopic - Urine Culture  6. 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  No signs of complications, medication side effects, or red flags.  Continue current regimen.    7. Peripheral arterial disease ([place]) See above.  8. Complete heart block ([place]) Well controlled.  No signs of  complications, medication side effects, or red flags.  Continue current regimen.    . Reviewed expectations re: course of current medical issues. . Discussed self-management of symptoms. . Outlined signs and symptoms indicating need for more acute intervention. . Patient verbalized understanding and all questions were answered. Marland Kitchen Health Maintenance issues including appropriate healthy diet, exercise, and smoking avoidance were discussed with patient. . See orders for this visit as documented in the electronic medical record. . Patient received an After Visit Summary.  CMA served as Education administrator during this visit. History, Physical, and Plan performed by medical provider. The above documentation has been reviewed and is accurate and complete. Briscoe Deutscher, D.O.  Briscoe Deutscher, DO [place], [place] 10/06/2017  The patient states that she takes all of the medications on her list, but I would like for her to bring all medications to her next visit. She did indicate the need for home health - PT for difficulty walking, SN for medication help, possible help with cleaning and food preparation. Will place those referrals as well.  Records requested if needed. Time spent with the patient: 45 minutes, of which >50% was spent in obtaining information about her symptoms, reviewing her previous labs, evaluations, and treatments, counseling her about her condition (please see the discussed topics above), and developing a plan to further investigate it; she had a number of questions which I addressed.

## 2017-10-04 NOTE — Patient Instructions (Signed)
Please bring all medication bottles to next visit.

## 2017-10-05 ENCOUNTER — Telehealth: Payer: Self-pay | Admitting: Family Medicine

## 2017-10-05 LAB — URINE CULTURE
MICRO NUMBER:: 90154564
SPECIMEN QUALITY:: ADEQUATE

## 2017-10-05 NOTE — Telephone Encounter (Signed)
Verified with pharmacy that prescription was there and notified patient that prescription was ready for pick up.

## 2017-10-05 NOTE — Telephone Encounter (Signed)
See note

## 2017-10-05 NOTE — Telephone Encounter (Signed)
Copied from [place]. Topic: Quick Communication - See Telephone Encounter >> Oct 05, 2017 10:02 AM Vernona Rieger wrote: CRM for notification. See Telephone encounter for:   10/05/17.  Patient said the new script that she was prescribed yesterday ( she said she doesn't know the name, she never told her ) she said the pharmacy does not have it. Tried to help her figure it out & she clearly does not know. Call back [place], [place] - [address]

## 2017-10-07 ENCOUNTER — Other Ambulatory Visit: Payer: Self-pay | Admitting: Surgical

## 2017-10-07 DIAGNOSIS — I10 Essential (primary) hypertension: Principal | ICD-10-CM

## 2017-10-07 DIAGNOSIS — R2689 Other abnormalities of gait and mobility: Secondary | ICD-10-CM

## 2017-10-07 DIAGNOSIS — E1159 Type 2 diabetes mellitus with other circulatory complications: Secondary | ICD-10-CM

## 2017-10-10 DIAGNOSIS — E1151 Type 2 diabetes mellitus with diabetic peripheral angiopathy without gangrene: Secondary | ICD-10-CM | POA: Diagnosis not present

## 2017-10-10 DIAGNOSIS — L4050 Arthropathic psoriasis, unspecified: Secondary | ICD-10-CM | POA: Diagnosis not present

## 2017-10-10 DIAGNOSIS — Z950 Presence of cardiac pacemaker: Secondary | ICD-10-CM | POA: Diagnosis not present

## 2017-10-10 DIAGNOSIS — G8929 Other chronic pain: Secondary | ICD-10-CM | POA: Diagnosis not present

## 2017-10-10 DIAGNOSIS — E785 Hyperlipidemia, unspecified: Secondary | ICD-10-CM | POA: Diagnosis not present

## 2017-10-10 DIAGNOSIS — Z7902 Long term (current) use of antithrombotics/antiplatelets: Secondary | ICD-10-CM | POA: Diagnosis not present

## 2017-10-10 DIAGNOSIS — Z87440 Personal history of urinary (tract) infections: Secondary | ICD-10-CM | POA: Diagnosis not present

## 2017-10-10 DIAGNOSIS — D509 Iron deficiency anemia, unspecified: Secondary | ICD-10-CM | POA: Diagnosis not present

## 2017-10-10 DIAGNOSIS — M5440 Lumbago with sciatica, unspecified side: Secondary | ICD-10-CM | POA: Diagnosis not present

## 2017-10-10 DIAGNOSIS — I10 Essential (primary) hypertension: Secondary | ICD-10-CM | POA: Diagnosis not present

## 2017-10-10 DIAGNOSIS — Z7984 Long term (current) use of oral hypoglycemic drugs: Secondary | ICD-10-CM | POA: Diagnosis not present

## 2017-10-10 DIAGNOSIS — I447 Left bundle-branch block, unspecified: Secondary | ICD-10-CM | POA: Diagnosis not present

## 2017-10-10 DIAGNOSIS — E538 Deficiency of other specified B group vitamins: Secondary | ICD-10-CM | POA: Diagnosis not present

## 2017-10-10 DIAGNOSIS — Z9181 History of falling: Secondary | ICD-10-CM | POA: Diagnosis not present

## 2017-10-10 DIAGNOSIS — E1159 Type 2 diabetes mellitus with other circulatory complications: Secondary | ICD-10-CM | POA: Diagnosis not present

## 2017-10-10 DIAGNOSIS — I442 Atrioventricular block, complete: Secondary | ICD-10-CM | POA: Diagnosis not present

## 2017-10-11 ENCOUNTER — Telehealth: Payer: Self-pay | Admitting: Family Medicine

## 2017-10-11 NOTE — Telephone Encounter (Unsigned)
Copied from [place] [id number]. Topic: Quick Communication - See Telephone Encounter >> Oct 11, 2017 12:13 PM Hewitt Shorts wrote: CRM for notification. See Telephone encounter for: Armida Sans from [facility] [phone] ok to leave a message -she is needing verbal orders for pt ot social worker home health aide skilled nusing 2 times a week for 2 weeks and 1 week for 7 weeks   10/11/17.

## 2017-10-11 NOTE — Telephone Encounter (Signed)
Please advise 

## 2017-10-12 NOTE — Telephone Encounter (Signed)
Yes

## 2017-10-12 NOTE — Telephone Encounter (Signed)
Left message to return call to our office.  

## 2017-10-12 NOTE — Telephone Encounter (Signed)
Ok to give verbal 

## 2017-10-13 ENCOUNTER — Telehealth: Payer: Self-pay | Admitting: Family Medicine

## 2017-10-13 DIAGNOSIS — I442 Atrioventricular block, complete: Secondary | ICD-10-CM | POA: Diagnosis not present

## 2017-10-13 DIAGNOSIS — Z7984 Long term (current) use of oral hypoglycemic drugs: Secondary | ICD-10-CM | POA: Diagnosis not present

## 2017-10-13 DIAGNOSIS — G8929 Other chronic pain: Secondary | ICD-10-CM | POA: Diagnosis not present

## 2017-10-13 DIAGNOSIS — E1159 Type 2 diabetes mellitus with other circulatory complications: Secondary | ICD-10-CM | POA: Diagnosis not present

## 2017-10-13 DIAGNOSIS — E538 Deficiency of other specified B group vitamins: Secondary | ICD-10-CM | POA: Diagnosis not present

## 2017-10-13 DIAGNOSIS — M5440 Lumbago with sciatica, unspecified side: Secondary | ICD-10-CM | POA: Diagnosis not present

## 2017-10-13 DIAGNOSIS — I10 Essential (primary) hypertension: Secondary | ICD-10-CM | POA: Diagnosis not present

## 2017-10-13 DIAGNOSIS — E785 Hyperlipidemia, unspecified: Secondary | ICD-10-CM | POA: Diagnosis not present

## 2017-10-13 DIAGNOSIS — D509 Iron deficiency anemia, unspecified: Secondary | ICD-10-CM | POA: Diagnosis not present

## 2017-10-13 DIAGNOSIS — L4050 Arthropathic psoriasis, unspecified: Secondary | ICD-10-CM | POA: Diagnosis not present

## 2017-10-13 DIAGNOSIS — I447 Left bundle-branch block, unspecified: Secondary | ICD-10-CM | POA: Diagnosis not present

## 2017-10-13 DIAGNOSIS — E1151 Type 2 diabetes mellitus with diabetic peripheral angiopathy without gangrene: Secondary | ICD-10-CM | POA: Diagnosis not present

## 2017-10-13 NOTE — Telephone Encounter (Signed)
Please advise 

## 2017-10-13 NOTE — Telephone Encounter (Signed)
No answer detailed message left with verbal

## 2017-10-13 NOTE — Telephone Encounter (Signed)
Copied from [place] [id number]. Topic: Quick Communication - See Telephone Encounter >> Oct 13, 2017 12:02 PM Vernona Rieger wrote: CRM for notification. See Telephone encounter for:   10/13/17.  Kiran from [facility] needs verbals for physical twice a week for four weeks starting next week. Call back [phone]

## 2017-10-13 NOTE — Telephone Encounter (Signed)
See first message for documentation.

## 2017-10-14 DIAGNOSIS — I447 Left bundle-branch block, unspecified: Secondary | ICD-10-CM | POA: Diagnosis not present

## 2017-10-14 DIAGNOSIS — E785 Hyperlipidemia, unspecified: Secondary | ICD-10-CM | POA: Diagnosis not present

## 2017-10-14 DIAGNOSIS — G8929 Other chronic pain: Secondary | ICD-10-CM | POA: Diagnosis not present

## 2017-10-14 DIAGNOSIS — M5440 Lumbago with sciatica, unspecified side: Secondary | ICD-10-CM | POA: Diagnosis not present

## 2017-10-14 DIAGNOSIS — Z7984 Long term (current) use of oral hypoglycemic drugs: Secondary | ICD-10-CM | POA: Diagnosis not present

## 2017-10-14 DIAGNOSIS — E538 Deficiency of other specified B group vitamins: Secondary | ICD-10-CM | POA: Diagnosis not present

## 2017-10-14 DIAGNOSIS — D509 Iron deficiency anemia, unspecified: Secondary | ICD-10-CM | POA: Diagnosis not present

## 2017-10-14 DIAGNOSIS — I10 Essential (primary) hypertension: Secondary | ICD-10-CM | POA: Diagnosis not present

## 2017-10-14 DIAGNOSIS — L4050 Arthropathic psoriasis, unspecified: Secondary | ICD-10-CM | POA: Diagnosis not present

## 2017-10-14 DIAGNOSIS — E1159 Type 2 diabetes mellitus with other circulatory complications: Secondary | ICD-10-CM | POA: Diagnosis not present

## 2017-10-14 DIAGNOSIS — I442 Atrioventricular block, complete: Secondary | ICD-10-CM | POA: Diagnosis not present

## 2017-10-14 DIAGNOSIS — E1151 Type 2 diabetes mellitus with diabetic peripheral angiopathy without gangrene: Secondary | ICD-10-CM | POA: Diagnosis not present

## 2017-10-17 DIAGNOSIS — E1159 Type 2 diabetes mellitus with other circulatory complications: Secondary | ICD-10-CM | POA: Diagnosis not present

## 2017-10-17 DIAGNOSIS — E538 Deficiency of other specified B group vitamins: Secondary | ICD-10-CM | POA: Diagnosis not present

## 2017-10-17 DIAGNOSIS — G8929 Other chronic pain: Secondary | ICD-10-CM | POA: Diagnosis not present

## 2017-10-17 DIAGNOSIS — E1151 Type 2 diabetes mellitus with diabetic peripheral angiopathy without gangrene: Secondary | ICD-10-CM | POA: Diagnosis not present

## 2017-10-17 DIAGNOSIS — Z7984 Long term (current) use of oral hypoglycemic drugs: Secondary | ICD-10-CM | POA: Diagnosis not present

## 2017-10-17 DIAGNOSIS — I442 Atrioventricular block, complete: Secondary | ICD-10-CM | POA: Diagnosis not present

## 2017-10-17 DIAGNOSIS — I10 Essential (primary) hypertension: Secondary | ICD-10-CM | POA: Diagnosis not present

## 2017-10-17 DIAGNOSIS — M5440 Lumbago with sciatica, unspecified side: Secondary | ICD-10-CM | POA: Diagnosis not present

## 2017-10-17 DIAGNOSIS — L4050 Arthropathic psoriasis, unspecified: Secondary | ICD-10-CM | POA: Diagnosis not present

## 2017-10-17 DIAGNOSIS — E785 Hyperlipidemia, unspecified: Secondary | ICD-10-CM | POA: Diagnosis not present

## 2017-10-17 DIAGNOSIS — I447 Left bundle-branch block, unspecified: Secondary | ICD-10-CM | POA: Diagnosis not present

## 2017-10-17 DIAGNOSIS — D509 Iron deficiency anemia, unspecified: Secondary | ICD-10-CM | POA: Diagnosis not present

## 2017-10-18 DIAGNOSIS — E538 Deficiency of other specified B group vitamins: Secondary | ICD-10-CM | POA: Diagnosis not present

## 2017-10-18 DIAGNOSIS — L4050 Arthropathic psoriasis, unspecified: Secondary | ICD-10-CM | POA: Diagnosis not present

## 2017-10-18 DIAGNOSIS — G8929 Other chronic pain: Secondary | ICD-10-CM | POA: Diagnosis not present

## 2017-10-18 DIAGNOSIS — M5440 Lumbago with sciatica, unspecified side: Secondary | ICD-10-CM | POA: Diagnosis not present

## 2017-10-18 DIAGNOSIS — E1159 Type 2 diabetes mellitus with other circulatory complications: Secondary | ICD-10-CM | POA: Diagnosis not present

## 2017-10-18 DIAGNOSIS — I447 Left bundle-branch block, unspecified: Secondary | ICD-10-CM | POA: Diagnosis not present

## 2017-10-18 DIAGNOSIS — E785 Hyperlipidemia, unspecified: Secondary | ICD-10-CM | POA: Diagnosis not present

## 2017-10-18 DIAGNOSIS — I442 Atrioventricular block, complete: Secondary | ICD-10-CM | POA: Diagnosis not present

## 2017-10-18 DIAGNOSIS — D509 Iron deficiency anemia, unspecified: Secondary | ICD-10-CM | POA: Diagnosis not present

## 2017-10-18 DIAGNOSIS — E1151 Type 2 diabetes mellitus with diabetic peripheral angiopathy without gangrene: Secondary | ICD-10-CM | POA: Diagnosis not present

## 2017-10-18 DIAGNOSIS — Z7984 Long term (current) use of oral hypoglycemic drugs: Secondary | ICD-10-CM | POA: Diagnosis not present

## 2017-10-18 DIAGNOSIS — I10 Essential (primary) hypertension: Secondary | ICD-10-CM | POA: Diagnosis not present

## 2017-10-20 DIAGNOSIS — D509 Iron deficiency anemia, unspecified: Secondary | ICD-10-CM | POA: Diagnosis not present

## 2017-10-20 DIAGNOSIS — E1159 Type 2 diabetes mellitus with other circulatory complications: Secondary | ICD-10-CM | POA: Diagnosis not present

## 2017-10-20 DIAGNOSIS — I442 Atrioventricular block, complete: Secondary | ICD-10-CM | POA: Diagnosis not present

## 2017-10-20 DIAGNOSIS — E538 Deficiency of other specified B group vitamins: Secondary | ICD-10-CM | POA: Diagnosis not present

## 2017-10-20 DIAGNOSIS — Z7984 Long term (current) use of oral hypoglycemic drugs: Secondary | ICD-10-CM | POA: Diagnosis not present

## 2017-10-20 DIAGNOSIS — I447 Left bundle-branch block, unspecified: Secondary | ICD-10-CM | POA: Diagnosis not present

## 2017-10-20 DIAGNOSIS — G8929 Other chronic pain: Secondary | ICD-10-CM | POA: Diagnosis not present

## 2017-10-20 DIAGNOSIS — I10 Essential (primary) hypertension: Secondary | ICD-10-CM | POA: Diagnosis not present

## 2017-10-20 DIAGNOSIS — E1151 Type 2 diabetes mellitus with diabetic peripheral angiopathy without gangrene: Secondary | ICD-10-CM | POA: Diagnosis not present

## 2017-10-20 DIAGNOSIS — L4050 Arthropathic psoriasis, unspecified: Secondary | ICD-10-CM | POA: Diagnosis not present

## 2017-10-20 DIAGNOSIS — E785 Hyperlipidemia, unspecified: Secondary | ICD-10-CM | POA: Diagnosis not present

## 2017-10-20 DIAGNOSIS — M5440 Lumbago with sciatica, unspecified side: Secondary | ICD-10-CM | POA: Diagnosis not present

## 2017-10-24 ENCOUNTER — Other Ambulatory Visit: Payer: Self-pay | Admitting: Family Medicine

## 2017-10-24 NOTE — Telephone Encounter (Signed)
Norco LOV 10/04/17 Dr. Johnny Bridge [place].

## 2017-10-24 NOTE — Telephone Encounter (Signed)
Copied from [place] [phone]. Topic: Quick Communication - Rx Refill/Question >> Oct 24, 2017  9:47 AM Lolita Rieger, RMA wrote: Medication: norco 5-325 mg   Has the patient contacted their pharmacy? yes   (Agent: If no, request that the patient contact the pharmacy for the refill.)   Preferred Pharmacy (with phone number or street name): [facility] in [place]: Please be advised that RX refills may take up to 3 business days. We ask that you follow-up with your pharmacy.

## 2017-10-25 DIAGNOSIS — R350 Frequency of micturition: Secondary | ICD-10-CM | POA: Diagnosis not present

## 2017-10-25 MED ORDER — HYDROCODONE-ACETAMINOPHEN 5-325 MG PO TABS: 1.0000 | ORAL_TABLET | Freq: Four times a day (QID) | ORAL | 0 refills | Status: DC | PRN

## 2017-10-25 NOTE — Telephone Encounter (Signed)
Please advise on refill.

## 2017-10-27 DIAGNOSIS — G8929 Other chronic pain: Secondary | ICD-10-CM | POA: Diagnosis not present

## 2017-10-27 DIAGNOSIS — I10 Essential (primary) hypertension: Secondary | ICD-10-CM | POA: Diagnosis not present

## 2017-10-27 DIAGNOSIS — L4050 Arthropathic psoriasis, unspecified: Secondary | ICD-10-CM | POA: Diagnosis not present

## 2017-10-27 DIAGNOSIS — I442 Atrioventricular block, complete: Secondary | ICD-10-CM | POA: Diagnosis not present

## 2017-10-27 DIAGNOSIS — I447 Left bundle-branch block, unspecified: Secondary | ICD-10-CM | POA: Diagnosis not present

## 2017-10-27 DIAGNOSIS — E1159 Type 2 diabetes mellitus with other circulatory complications: Secondary | ICD-10-CM | POA: Diagnosis not present

## 2017-10-27 DIAGNOSIS — M5440 Lumbago with sciatica, unspecified side: Secondary | ICD-10-CM | POA: Diagnosis not present

## 2017-10-27 DIAGNOSIS — E538 Deficiency of other specified B group vitamins: Secondary | ICD-10-CM | POA: Diagnosis not present

## 2017-10-27 DIAGNOSIS — E785 Hyperlipidemia, unspecified: Secondary | ICD-10-CM | POA: Diagnosis not present

## 2017-10-27 DIAGNOSIS — D509 Iron deficiency anemia, unspecified: Secondary | ICD-10-CM | POA: Diagnosis not present

## 2017-10-27 DIAGNOSIS — Z7984 Long term (current) use of oral hypoglycemic drugs: Secondary | ICD-10-CM | POA: Diagnosis not present

## 2017-10-27 DIAGNOSIS — E1151 Type 2 diabetes mellitus with diabetic peripheral angiopathy without gangrene: Secondary | ICD-10-CM | POA: Diagnosis not present

## 2017-10-28 DIAGNOSIS — D509 Iron deficiency anemia, unspecified: Secondary | ICD-10-CM | POA: Diagnosis not present

## 2017-10-28 DIAGNOSIS — I10 Essential (primary) hypertension: Secondary | ICD-10-CM | POA: Diagnosis not present

## 2017-10-28 DIAGNOSIS — G8929 Other chronic pain: Secondary | ICD-10-CM | POA: Diagnosis not present

## 2017-10-28 DIAGNOSIS — I442 Atrioventricular block, complete: Secondary | ICD-10-CM | POA: Diagnosis not present

## 2017-10-28 DIAGNOSIS — M5440 Lumbago with sciatica, unspecified side: Secondary | ICD-10-CM | POA: Diagnosis not present

## 2017-10-28 DIAGNOSIS — E785 Hyperlipidemia, unspecified: Secondary | ICD-10-CM | POA: Diagnosis not present

## 2017-10-28 DIAGNOSIS — E1159 Type 2 diabetes mellitus with other circulatory complications: Secondary | ICD-10-CM | POA: Diagnosis not present

## 2017-10-28 DIAGNOSIS — E538 Deficiency of other specified B group vitamins: Secondary | ICD-10-CM | POA: Diagnosis not present

## 2017-10-28 DIAGNOSIS — Z7984 Long term (current) use of oral hypoglycemic drugs: Secondary | ICD-10-CM | POA: Diagnosis not present

## 2017-10-28 DIAGNOSIS — L4050 Arthropathic psoriasis, unspecified: Secondary | ICD-10-CM | POA: Diagnosis not present

## 2017-10-28 DIAGNOSIS — E1151 Type 2 diabetes mellitus with diabetic peripheral angiopathy without gangrene: Secondary | ICD-10-CM | POA: Diagnosis not present

## 2017-10-28 DIAGNOSIS — I447 Left bundle-branch block, unspecified: Secondary | ICD-10-CM | POA: Diagnosis not present

## 2017-10-31 DIAGNOSIS — E538 Deficiency of other specified B group vitamins: Secondary | ICD-10-CM | POA: Diagnosis not present

## 2017-10-31 DIAGNOSIS — L4050 Arthropathic psoriasis, unspecified: Secondary | ICD-10-CM | POA: Diagnosis not present

## 2017-10-31 DIAGNOSIS — G8929 Other chronic pain: Secondary | ICD-10-CM | POA: Diagnosis not present

## 2017-10-31 DIAGNOSIS — I442 Atrioventricular block, complete: Secondary | ICD-10-CM | POA: Diagnosis not present

## 2017-10-31 DIAGNOSIS — E1151 Type 2 diabetes mellitus with diabetic peripheral angiopathy without gangrene: Secondary | ICD-10-CM | POA: Diagnosis not present

## 2017-10-31 DIAGNOSIS — E1159 Type 2 diabetes mellitus with other circulatory complications: Secondary | ICD-10-CM | POA: Diagnosis not present

## 2017-10-31 DIAGNOSIS — I447 Left bundle-branch block, unspecified: Secondary | ICD-10-CM | POA: Diagnosis not present

## 2017-10-31 DIAGNOSIS — M5440 Lumbago with sciatica, unspecified side: Secondary | ICD-10-CM | POA: Diagnosis not present

## 2017-10-31 DIAGNOSIS — Z7984 Long term (current) use of oral hypoglycemic drugs: Secondary | ICD-10-CM | POA: Diagnosis not present

## 2017-10-31 DIAGNOSIS — D509 Iron deficiency anemia, unspecified: Secondary | ICD-10-CM | POA: Diagnosis not present

## 2017-10-31 DIAGNOSIS — I10 Essential (primary) hypertension: Secondary | ICD-10-CM | POA: Diagnosis not present

## 2017-10-31 DIAGNOSIS — E785 Hyperlipidemia, unspecified: Secondary | ICD-10-CM | POA: Diagnosis not present

## 2017-11-01 DIAGNOSIS — G8929 Other chronic pain: Secondary | ICD-10-CM | POA: Diagnosis not present

## 2017-11-01 DIAGNOSIS — I10 Essential (primary) hypertension: Secondary | ICD-10-CM | POA: Diagnosis not present

## 2017-11-01 DIAGNOSIS — M5440 Lumbago with sciatica, unspecified side: Secondary | ICD-10-CM | POA: Diagnosis not present

## 2017-11-01 DIAGNOSIS — I447 Left bundle-branch block, unspecified: Secondary | ICD-10-CM | POA: Diagnosis not present

## 2017-11-01 DIAGNOSIS — E538 Deficiency of other specified B group vitamins: Secondary | ICD-10-CM | POA: Diagnosis not present

## 2017-11-01 DIAGNOSIS — E785 Hyperlipidemia, unspecified: Secondary | ICD-10-CM | POA: Diagnosis not present

## 2017-11-01 DIAGNOSIS — I442 Atrioventricular block, complete: Secondary | ICD-10-CM | POA: Diagnosis not present

## 2017-11-01 DIAGNOSIS — E1151 Type 2 diabetes mellitus with diabetic peripheral angiopathy without gangrene: Secondary | ICD-10-CM | POA: Diagnosis not present

## 2017-11-01 DIAGNOSIS — D509 Iron deficiency anemia, unspecified: Secondary | ICD-10-CM | POA: Diagnosis not present

## 2017-11-01 DIAGNOSIS — L4050 Arthropathic psoriasis, unspecified: Secondary | ICD-10-CM | POA: Diagnosis not present

## 2017-11-01 DIAGNOSIS — E1159 Type 2 diabetes mellitus with other circulatory complications: Secondary | ICD-10-CM | POA: Diagnosis not present

## 2017-11-01 DIAGNOSIS — Z7984 Long term (current) use of oral hypoglycemic drugs: Secondary | ICD-10-CM | POA: Diagnosis not present

## 2017-11-02 DIAGNOSIS — I10 Essential (primary) hypertension: Secondary | ICD-10-CM | POA: Diagnosis not present

## 2017-11-02 DIAGNOSIS — E538 Deficiency of other specified B group vitamins: Secondary | ICD-10-CM | POA: Diagnosis not present

## 2017-11-02 DIAGNOSIS — G8929 Other chronic pain: Secondary | ICD-10-CM | POA: Diagnosis not present

## 2017-11-02 DIAGNOSIS — M5440 Lumbago with sciatica, unspecified side: Secondary | ICD-10-CM | POA: Diagnosis not present

## 2017-11-02 DIAGNOSIS — E785 Hyperlipidemia, unspecified: Secondary | ICD-10-CM | POA: Diagnosis not present

## 2017-11-02 DIAGNOSIS — L4050 Arthropathic psoriasis, unspecified: Secondary | ICD-10-CM | POA: Diagnosis not present

## 2017-11-02 DIAGNOSIS — E1159 Type 2 diabetes mellitus with other circulatory complications: Secondary | ICD-10-CM | POA: Diagnosis not present

## 2017-11-02 DIAGNOSIS — I442 Atrioventricular block, complete: Secondary | ICD-10-CM | POA: Diagnosis not present

## 2017-11-02 DIAGNOSIS — I447 Left bundle-branch block, unspecified: Secondary | ICD-10-CM | POA: Diagnosis not present

## 2017-11-02 DIAGNOSIS — Z7984 Long term (current) use of oral hypoglycemic drugs: Secondary | ICD-10-CM | POA: Diagnosis not present

## 2017-11-02 DIAGNOSIS — D509 Iron deficiency anemia, unspecified: Secondary | ICD-10-CM | POA: Diagnosis not present

## 2017-11-02 DIAGNOSIS — E1151 Type 2 diabetes mellitus with diabetic peripheral angiopathy without gangrene: Secondary | ICD-10-CM | POA: Diagnosis not present

## 2017-11-04 ENCOUNTER — Telehealth: Payer: Self-pay

## 2017-11-04 DIAGNOSIS — L4050 Arthropathic psoriasis, unspecified: Secondary | ICD-10-CM | POA: Diagnosis not present

## 2017-11-04 DIAGNOSIS — E785 Hyperlipidemia, unspecified: Secondary | ICD-10-CM | POA: Diagnosis not present

## 2017-11-04 DIAGNOSIS — G8929 Other chronic pain: Secondary | ICD-10-CM | POA: Diagnosis not present

## 2017-11-04 DIAGNOSIS — I447 Left bundle-branch block, unspecified: Secondary | ICD-10-CM | POA: Diagnosis not present

## 2017-11-04 DIAGNOSIS — I442 Atrioventricular block, complete: Secondary | ICD-10-CM | POA: Diagnosis not present

## 2017-11-04 DIAGNOSIS — E1159 Type 2 diabetes mellitus with other circulatory complications: Secondary | ICD-10-CM | POA: Diagnosis not present

## 2017-11-04 DIAGNOSIS — I10 Essential (primary) hypertension: Secondary | ICD-10-CM | POA: Diagnosis not present

## 2017-11-04 DIAGNOSIS — Z7984 Long term (current) use of oral hypoglycemic drugs: Secondary | ICD-10-CM | POA: Diagnosis not present

## 2017-11-04 DIAGNOSIS — E1151 Type 2 diabetes mellitus with diabetic peripheral angiopathy without gangrene: Secondary | ICD-10-CM | POA: Diagnosis not present

## 2017-11-04 DIAGNOSIS — D509 Iron deficiency anemia, unspecified: Secondary | ICD-10-CM | POA: Diagnosis not present

## 2017-11-04 DIAGNOSIS — E538 Deficiency of other specified B group vitamins: Secondary | ICD-10-CM | POA: Diagnosis not present

## 2017-11-04 DIAGNOSIS — M5440 Lumbago with sciatica, unspecified side: Secondary | ICD-10-CM | POA: Diagnosis not present

## 2017-11-04 NOTE — Telephone Encounter (Signed)
Left message for patient daughter to return call.  

## 2017-11-04 NOTE — Telephone Encounter (Signed)
Daughter [name] calling Roselyn Reef back you can reach her on her cell [phone]

## 2017-11-04 NOTE — Telephone Encounter (Signed)
E mail received from daughter [name]) She is concerned about amount of Lasix her mom should be taking and how often. She feels as though her memory is not and she may be forgetting. She wants to know if she should be taking every day and if so she needs refill.

## 2017-11-04 NOTE — Telephone Encounter (Signed)
Call please. Okay to give refill. Let daughter know that we have been wanting to have Krista Clarke bring in medication bottles.  She needs to be taking the Lasix daily with potassium.

## 2017-11-04 NOTE — Telephone Encounter (Signed)
See note

## 2017-11-04 NOTE — Telephone Encounter (Signed)
Please advise 

## 2017-11-08 ENCOUNTER — Encounter: Payer: Self-pay | Admitting: Family Medicine

## 2017-11-08 ENCOUNTER — Ambulatory Visit (INDEPENDENT_AMBULATORY_CARE_PROVIDER_SITE_OTHER): Payer: Medicare Other | Admitting: Family Medicine

## 2017-11-08 DIAGNOSIS — M5440 Lumbago with sciatica, unspecified side: Secondary | ICD-10-CM | POA: Diagnosis not present

## 2017-11-08 DIAGNOSIS — L4050 Arthropathic psoriasis, unspecified: Secondary | ICD-10-CM | POA: Diagnosis not present

## 2017-11-08 DIAGNOSIS — D509 Iron deficiency anemia, unspecified: Secondary | ICD-10-CM

## 2017-11-08 DIAGNOSIS — E785 Hyperlipidemia, unspecified: Secondary | ICD-10-CM | POA: Diagnosis not present

## 2017-11-08 DIAGNOSIS — G8929 Other chronic pain: Secondary | ICD-10-CM | POA: Diagnosis not present

## 2017-11-08 DIAGNOSIS — I10 Essential (primary) hypertension: Secondary | ICD-10-CM | POA: Diagnosis not present

## 2017-11-08 DIAGNOSIS — E1169 Type 2 diabetes mellitus with other specified complication: Secondary | ICD-10-CM | POA: Diagnosis not present

## 2017-11-08 DIAGNOSIS — E538 Deficiency of other specified B group vitamins: Secondary | ICD-10-CM | POA: Diagnosis not present

## 2017-11-08 DIAGNOSIS — E1159 Type 2 diabetes mellitus with other circulatory complications: Secondary | ICD-10-CM

## 2017-11-08 DIAGNOSIS — E7800 Pure hypercholesterolemia, unspecified: Secondary | ICD-10-CM | POA: Diagnosis not present

## 2017-11-08 DIAGNOSIS — Z7984 Long term (current) use of oral hypoglycemic drugs: Secondary | ICD-10-CM | POA: Diagnosis not present

## 2017-11-08 DIAGNOSIS — E1151 Type 2 diabetes mellitus with diabetic peripheral angiopathy without gangrene: Secondary | ICD-10-CM | POA: Diagnosis not present

## 2017-11-08 DIAGNOSIS — E669 Obesity, unspecified: Secondary | ICD-10-CM | POA: Diagnosis not present

## 2017-11-08 DIAGNOSIS — I447 Left bundle-branch block, unspecified: Secondary | ICD-10-CM | POA: Diagnosis not present

## 2017-11-08 DIAGNOSIS — I442 Atrioventricular block, complete: Secondary | ICD-10-CM | POA: Diagnosis not present

## 2017-11-08 DIAGNOSIS — I152 Hypertension secondary to endocrine disorders: Secondary | ICD-10-CM

## 2017-11-08 NOTE — Progress Notes (Signed)
Krista Clarke is a 77 y.o. female is here for follow up.  History of Present Illness:  [name] CMA acting as scribe for Dr. Juleen China.  HPI: Patient come sin today for a follow up. She has lost about 5 pounds since last visit. She has just started back on her lasix.   Anemia: Hemoglobin was at a 10 from last labs. It has chronically been low.   Diabetes: Patients A1c was at an 8.4 a month ago. This has went up from previous A1c from 4 months ago when it was at a 7.8. Patients goal is under an 8. Dr. Juleen China will not prescribe a new medication at this time. Talk with patient about watching diet to get below 8.  Edema: Patient is still having edema in right leg and foot. She has not taken her lasix today. Will continue lasix told the patient to get compression socks to help with edema. Advised the patient that she needs to keep legs elevated.    Review of Systems  Constitutional: Negative for chills and fever.  HENT: Negative for ear pain.   Eyes: Negative for blurred vision and double vision.  Respiratory: Negative for cough and wheezing.   Cardiovascular: Positive for leg swelling. Negative for chest pain.       Right leg and foot   Gastrointestinal: Negative for abdominal pain and vomiting.  Genitourinary: Negative for dysuria and urgency.  Musculoskeletal: Negative for back pain and neck pain.  Neurological: Negative for dizziness and headaches.  Psychiatric/Behavioral: Negative for depression and suicidal ideas.   Health Maintenance Due  Topic Date Due  . TETANUS/TDAP  08/30/1958  . PNA vac Low Risk Adult (1 of 2 - PCV13) 08/30/2004   Depression screen PHQ 2/9 04/28/2017 03/25/2017 10/01/2016  Decreased Interest 0 0 0  Down, Depressed, Hopeless 0 0 0  PHQ - 2 Score 0 0 0   PMHx, SurgHx, SocialHx, FamHx, Medications, and Allergies were reviewed in the Visit Navigator and updated as appropriate.   Patient Active Problem List   Diagnosis Date Noted  . LBBB (left bundle  branch block) 11/18/2016  . Pacemaker 11/18/2016  . PAD (peripheral artery disease) ([place]) 11/18/2016  . 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 Chronic midline low back pain with bilateral sciatica 10/02/2016  . Paresthesia of both lower extremities 10/02/2016  . Obesity, Class II, BMI 35-39.9, with comorbidity 10/02/2016  . HLD (hyperlipidemia) 10/02/2016  . Hypertension associated with diabetes ([place]) 03/08/2016  . Anemia, iron deficiency 03/08/2016  . Diabetes mellitus type 2 in obese ([place]) 03/08/2016  . Complete heart block ([place]) 08/20/2015  . Angina pectoris ([place]) 08/26/2014   Social History   Tobacco Use  . Smoking status: Never Smoker  . Smokeless tobacco: Never Used  Substance Use Topics  . Alcohol use: No  . Drug use: No   Current Medications and Allergies:   Current Outpatient Medications:  .  acetaminophen (TYLENOL) 500 MG tablet, Take 1,000 mg by mouth every 6 (six) hours as needed. Reported on 08/20/2015, Disp: , Rfl:  .  allopurinol (ZYLOPRIM) 100 MG tablet, TAKE 1 TABLET BY MOUTH EVERY MORNING, Disp: 90 tablet, Rfl: 0 .  amLODipine (NORVASC) 10 MG tablet, , Disp: , Rfl:  .  aspirin 81 MG chewable tablet, Chew by mouth daily., Disp: , Rfl:  .  atorvastatin (LIPITOR) 40 MG tablet, Take 40 mg by mouth daily. , Disp: , Rfl:  .  Calcium Citrate-Vitamin D (CALCIUM CITRATE + D PO), Take  1 tablet by mouth daily., Disp: , Rfl:  .  clopidogrel (PLAVIX) 75 MG tablet, Take 1 tablet (75 mg total) by mouth daily., Disp: 90 tablet, Rfl: 0 .  ferrous sulfate 325 (65 FE) MG EC tablet, Take 325 mg by mouth 2 (two) times daily., Disp: , Rfl:  .  furosemide (LASIX) 20 MG tablet, Take 1 tablet (20 mg total) by mouth daily., Disp: 20 tablet, Rfl: 0 .  gabapentin (NEURONTIN) 100 MG capsule, TAKE 2 CAPSULES BY MOUTH THREE TIMES DAILY, Disp: 180 capsule, Rfl: 3 .  hydrochlorothiazide (MICROZIDE) 12.5 MG capsule, TAKE 1 CAPSULE BY MOUTH  DAILY, Disp: 90 capsule, Rfl: 1 .   HYDROcodone-acetaminophen (NORCO/VICODIN) 5-325 MG tablet, Take 1 tablet by mouth 4 (four) times daily as needed (Chronic Pain)., Disp: 120 tablet, Rfl: 0 .  losartan-hydrochlorothiazide (HYZAAR) 50-12.5 MG tablet, Take 1 tablet by mouth daily., Disp: 90 tablet, Rfl: 3 .  Lutein 20 MG TABS, Take by mouth daily. , Disp: , Rfl:  .  magnesium oxide (MAG-OX) 400 MG tablet, Take 400 mg by mouth daily., Disp: , Rfl:  .  meclizine (ANTIVERT) 25 MG tablet, Take 1 tablet (25 mg total) by mouth 2 (two) times daily. Take no more than 4 times per week., Disp: 60 tablet, Rfl: 0 .  metFORMIN (GLUCOPHAGE) 1000 MG tablet, Take 1 tablet (1,000 mg total) by mouth 2 (two) times daily with a meal., Disp: 60 tablet, Rfl: 11 .  metoprolol tartrate (LOPRESSOR) 50 MG tablet, , Disp: , Rfl:  .  nitroGLYCERIN (NITROSTAT) 0.4 MG SL tablet, Place 0.4 mg under the tongue every 5 (five) minutes as needed for chest pain., Disp: , Rfl:  .  potassium chloride SA (K-DUR,KLOR-CON) 20 MEQ tablet, Take 1 tablet (20 mEq total) by mouth daily., Disp: 20 tablet, Rfl: 0 .  vitamin C (ASCORBIC ACID) 500 MG tablet, Take 500 mg by mouth daily., Disp: , Rfl:  .  Vorapaxar Sulfate (ZONTIVITY) 2.08 MG TABS, Take 1 tablet by mouth daily., Disp: 30 tablet, Rfl: 0  Allergies  Allergen Reactions  . Prednisone Palpitations and Other (See Comments)    Doesn't work well for patient   Review of Systems   Pertinent items are noted in the HPI. Otherwise, ROS is negative.  Vitals:   Vitals:   11/08/17 1142  BP: (!) 145/70  Pulse: 63  Temp: 97.9 F (36.6 C)  TempSrc: Oral  SpO2: 99%  Weight: 216 lb (98 kg)  Height: 5\' 3"  (1.6 m)     Body mass index is 38.26 kg/m.  Physical Exam:   Physical Exam  Constitutional: She is oriented to person, place, and time. She appears well-developed and well-nourished. No distress.  HENT:  Head: Normocephalic and atraumatic.  Eyes: Conjunctivae and EOM are normal. Pupils are equal, round, and  reactive to light.  Neck: Normal range of motion. Neck supple. No thyromegaly present.  Cardiovascular: Normal rate, regular rhythm and intact distal pulses.  Pulmonary/Chest: Effort normal and breath sounds normal.  Abdominal: Soft.  Neurological: She is alert and oriented to person, place, and time.  Skin: Skin is warm.  Psychiatric: She has a normal mood and affect. Her behavior is normal.  Nursing note and vitals reviewed.   Results for orders placed or performed in visit on 10/04/17  Urine Culture  Result Value Ref Range   MICRO NUMBER: [id number]    SPECIMEN QUALITY: ADEQUATE    Sample Source NOT GIVEN    STATUS: FINAL  Result:      Three or more organisms present, each greater than 10,000 cu/mL. May represent normal flora contamination from external genitalia. No further testing is required.  CBC with Differential/Platelet  Result Value Ref Range   WBC 5.8 4.0 - 10.5 K/uL   RBC 4.07 3.87 - 5.11 Mil/uL   Hemoglobin 10.0 (L) 12.0 - 15.0 g/dL   HCT 31.8 (L) 36.0 - 46.0 %   MCV 78.3 78.0 - 100.0 fl   MCHC 31.5 30.0 - 36.0 g/dL   RDW 15.4 11.5 - 15.5 %   Platelets 260.0 150.0 - 400.0 K/uL   Neutrophils Relative % 71.4 43.0 - 77.0 %   Lymphocytes Relative 17.5 12.0 - 46.0 %   Monocytes Relative 8.5 3.0 - 12.0 %   Eosinophils Relative 2.2 0.0 - 5.0 %   Basophils Relative 0.4 0.0 - 3.0 %   Neutro Abs 4.1 1.4 - 7.7 K/uL   Lymphs Abs 1.0 0.7 - 4.0 K/uL   Monocytes Absolute 0.5 0.1 - 1.0 K/uL   Eosinophils Absolute 0.1 0.0 - 0.7 K/uL   Basophils Absolute 0.0 0.0 - 0.1 K/uL  Comprehensive metabolic panel  Result Value Ref Range   Sodium 138 135 - 145 mEq/L   Potassium 4.4 3.5 - 5.1 mEq/L   Chloride 101 96 - 112 mEq/L   CO2 27 19 - 32 mEq/L   Glucose, Bld 161 (H) 70 - 99 mg/dL   BUN 24 (H) 6 - 23 mg/dL   Creatinine, Ser 0.97 0.40 - 1.20 mg/dL   Total Bilirubin 0.4 0.2 - 1.2 mg/dL   Alkaline Phosphatase 74 39 - 117 U/L   AST 11 0 - 37 U/L   ALT 10 0 - 35 U/L   Total  Protein 7.5 6.0 - 8.3 g/dL   Albumin 4.1 3.5 - 5.2 g/dL   Calcium 9.5 8.4 - 10.5 mg/dL   GFR 71.41 >60.00 mL/min  Hemoglobin A1c  Result Value Ref Range   Hgb A1c MFr Bld 8.4 (H) 4.6 - 6.5 %  Urinalysis, Routine w reflex microscopic  Result Value Ref Range   Color, Urine YELLOW Yellow;Lt. Yellow   APPearance Sl Cloudy (A) Clear   Specific Gravity, Urine 1.015 1.000 - 1.030   pH 6.0 5.0 - 8.0   Total Protein, Urine NEGATIVE Negative   Urine Glucose NEGATIVE Negative   Ketones, ur TRACE (A) Negative   Bilirubin Urine NEGATIVE Negative   Hgb urine dipstick NEGATIVE Negative   Urobilinogen, UA 0.2 0.0 - 1.0   Leukocytes, UA SMALL (A) Negative   Nitrite NEGATIVE Negative   WBC, UA 7-10/hpf (A) 0-2/hpf   RBC / HPF none seen 0-2/hpf   Mucus, UA Presence of (A) None   Squamous Epithelial / LPF Few(5-10/hpf) (A) Rare(0-4/hpf)   Bacteria, UA Few(10-50/hpf) (A) None   Yeast, UA Presence of (A) None  B12  Result Value Ref Range   Vitamin B-12 500 211 - 911 pg/mL   Assessment and Plan:   Patient Active Problem List   Diagnosis Date Noted  . LBBB (left bundle branch block) 11/18/2016  . Pacemaker 11/18/2016  . PAD (peripheral artery disease) ([place]) 11/18/2016  . 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 Chronic midline low back pain with bilateral sciatica 10/02/2016  . Paresthesia of both lower extremities 10/02/2016  . Obesity, Class II, BMI 35-39.9, with comorbidity 10/02/2016  . HLD (hyperlipidemia) 10/02/2016  . Hypertension associated with diabetes ([place]) 03/08/2016  . Anemia, iron deficiency 03/08/2016  .  Diabetes mellitus type 2 in obese ([place]) 03/08/2016  . Complete heart block ([place]) 08/20/2015  . Angina pectoris ([place]) 08/26/2014   Hypertension associated with diabetes ([place]) Elevated today, but still working on diuresing. Continue current treatment. Cardiovascular ROS: no chest pain or dyspnea on exertion.  Diabetes mellitus type 2 in obese [facility]) Current symptoms: no  polyuria or polydipsia, no chest pain, dyspnea or TIA's, has dysesthesias in the feet.   Lab Results  Component Value Date   HGBA1C 8.4 (H) 10/04/2017   Taking medication compliantly without noted sided effects [x]   YES  []   NO  Episodes of hypoglycemia? []   YES  [x]   NO Maintaining a diabetic diet? []   YES  [x]   NO Trying to exercise on a regular basis? []   YES  [x]   NO  On ACE inhibitor or angiotensin II receptor blocker? [x]   YES  []   NO On Aspirin? [x]   YES  []   NO  Anemia, iron deficiency Stable. Microcytic. Taking iron. Check TIBC at next visit. Will put in future order for reminder.   HLD (hyperlipidemia) Well controlled.  No signs of complications, medication side effects, or red flags.  Continue current regimen.    Orders Placed This Encounter  Procedures  . Iron, TIBC and Ferritin Panel   . Reviewed expectations re: course of current medical issues. . Discussed self-management of symptoms. . Outlined signs and symptoms indicating need for more acute intervention. . Patient verbalized understanding and all questions were answered. Marland Kitchen Health Maintenance issues including appropriate healthy diet, exercise, and smoking avoidance were discussed with patient. . See orders for this visit as documented in the electronic medical record. . Patient received an After Visit Summary.  CMA served as Education administrator during this visit. History, Physical, and Plan performed by medical provider. The above documentation has been reviewed and is accurate and complete. Briscoe Deutscher, D.O.  Briscoe Deutscher, DO [place] [place] [facility] 11/12/2017

## 2017-11-08 NOTE — Telephone Encounter (Signed)
Left message for patients daughter for her mother to bring all of her medication bottles with her to appointment. I also spoke with Ms. Elwell and ask her to bring all medication bottles with her to appointment.

## 2017-11-08 NOTE — Telephone Encounter (Signed)
Patient has appointment today

## 2017-11-09 DIAGNOSIS — E785 Hyperlipidemia, unspecified: Secondary | ICD-10-CM | POA: Diagnosis not present

## 2017-11-09 DIAGNOSIS — E538 Deficiency of other specified B group vitamins: Secondary | ICD-10-CM | POA: Diagnosis not present

## 2017-11-09 DIAGNOSIS — G8929 Other chronic pain: Secondary | ICD-10-CM | POA: Diagnosis not present

## 2017-11-09 DIAGNOSIS — D509 Iron deficiency anemia, unspecified: Secondary | ICD-10-CM | POA: Diagnosis not present

## 2017-11-09 DIAGNOSIS — M5440 Lumbago with sciatica, unspecified side: Secondary | ICD-10-CM | POA: Diagnosis not present

## 2017-11-09 DIAGNOSIS — I442 Atrioventricular block, complete: Secondary | ICD-10-CM | POA: Diagnosis not present

## 2017-11-09 DIAGNOSIS — Z7984 Long term (current) use of oral hypoglycemic drugs: Secondary | ICD-10-CM | POA: Diagnosis not present

## 2017-11-09 DIAGNOSIS — L4050 Arthropathic psoriasis, unspecified: Secondary | ICD-10-CM | POA: Diagnosis not present

## 2017-11-09 DIAGNOSIS — E1151 Type 2 diabetes mellitus with diabetic peripheral angiopathy without gangrene: Secondary | ICD-10-CM | POA: Diagnosis not present

## 2017-11-09 DIAGNOSIS — E1159 Type 2 diabetes mellitus with other circulatory complications: Secondary | ICD-10-CM | POA: Diagnosis not present

## 2017-11-09 DIAGNOSIS — I447 Left bundle-branch block, unspecified: Secondary | ICD-10-CM | POA: Diagnosis not present

## 2017-11-09 DIAGNOSIS — I10 Essential (primary) hypertension: Secondary | ICD-10-CM | POA: Diagnosis not present

## 2017-11-10 ENCOUNTER — Other Ambulatory Visit: Payer: Self-pay | Admitting: Family Medicine

## 2017-11-10 ENCOUNTER — Telehealth: Payer: Self-pay | Admitting: Family Medicine

## 2017-11-10 DIAGNOSIS — I10 Essential (primary) hypertension: Secondary | ICD-10-CM | POA: Diagnosis not present

## 2017-11-10 DIAGNOSIS — E538 Deficiency of other specified B group vitamins: Secondary | ICD-10-CM | POA: Diagnosis not present

## 2017-11-10 DIAGNOSIS — D509 Iron deficiency anemia, unspecified: Secondary | ICD-10-CM | POA: Diagnosis not present

## 2017-11-10 DIAGNOSIS — E1159 Type 2 diabetes mellitus with other circulatory complications: Secondary | ICD-10-CM | POA: Diagnosis not present

## 2017-11-10 DIAGNOSIS — G8929 Other chronic pain: Secondary | ICD-10-CM | POA: Diagnosis not present

## 2017-11-10 DIAGNOSIS — I447 Left bundle-branch block, unspecified: Secondary | ICD-10-CM | POA: Diagnosis not present

## 2017-11-10 DIAGNOSIS — E785 Hyperlipidemia, unspecified: Secondary | ICD-10-CM | POA: Diagnosis not present

## 2017-11-10 DIAGNOSIS — Z7984 Long term (current) use of oral hypoglycemic drugs: Secondary | ICD-10-CM | POA: Diagnosis not present

## 2017-11-10 DIAGNOSIS — L4050 Arthropathic psoriasis, unspecified: Secondary | ICD-10-CM | POA: Diagnosis not present

## 2017-11-10 DIAGNOSIS — E1151 Type 2 diabetes mellitus with diabetic peripheral angiopathy without gangrene: Secondary | ICD-10-CM | POA: Diagnosis not present

## 2017-11-10 DIAGNOSIS — M5440 Lumbago with sciatica, unspecified side: Secondary | ICD-10-CM | POA: Diagnosis not present

## 2017-11-10 DIAGNOSIS — R600 Localized edema: Secondary | ICD-10-CM

## 2017-11-10 DIAGNOSIS — I442 Atrioventricular block, complete: Secondary | ICD-10-CM | POA: Diagnosis not present

## 2017-11-10 NOTE — Telephone Encounter (Signed)
Left message to return call. There was no name on voice mail so did not leave verbal. OK to give verbal.

## 2017-11-10 NOTE — Telephone Encounter (Signed)
Copied from [place] [phone]. Topic: Inquiry >> Nov 10, 2017  1:06 PM Krista Clarke wrote: Reason for CRM: [facility] to extend [facility] orders, contact [phone]

## 2017-11-12 ENCOUNTER — Encounter: Payer: Self-pay | Admitting: Family Medicine

## 2017-11-12 NOTE — Assessment & Plan Note (Signed)
Well controlled.  No signs of complications, medication side effects, or red flags.  Continue current regimen.   

## 2017-11-12 NOTE — Assessment & Plan Note (Signed)
Elevated today, but still working on diuresing. Continue current treatment. Cardiovascular ROS: no chest pain or dyspnea on exertion.

## 2017-11-12 NOTE — Assessment & Plan Note (Signed)
Stable. Microcytic. Taking iron. Check TIBC at next visit. Will put in future order for reminder.

## 2017-11-12 NOTE — Assessment & Plan Note (Signed)
Current symptoms: no polyuria or polydipsia, no chest pain, dyspnea or TIA's, has dysesthesias in the feet.   Lab Results  Component Value Date   HGBA1C 8.4 (H) 10/04/2017   Taking medication compliantly without noted sided effects [x]   YES  []   NO  Episodes of hypoglycemia? []   YES  [x]   NO Maintaining a diabetic diet? []   YES  [x]   NO Trying to exercise on a regular basis? []   YES  [x]   NO  On ACE inhibitor or angiotensin II receptor blocker? [x]   YES  []   NO On Aspirin? [x]   YES  []   NO

## 2017-11-14 DIAGNOSIS — E785 Hyperlipidemia, unspecified: Secondary | ICD-10-CM | POA: Diagnosis not present

## 2017-11-14 DIAGNOSIS — M5440 Lumbago with sciatica, unspecified side: Secondary | ICD-10-CM | POA: Diagnosis not present

## 2017-11-14 DIAGNOSIS — I442 Atrioventricular block, complete: Secondary | ICD-10-CM | POA: Diagnosis not present

## 2017-11-14 DIAGNOSIS — E1151 Type 2 diabetes mellitus with diabetic peripheral angiopathy without gangrene: Secondary | ICD-10-CM | POA: Diagnosis not present

## 2017-11-14 DIAGNOSIS — L4050 Arthropathic psoriasis, unspecified: Secondary | ICD-10-CM | POA: Diagnosis not present

## 2017-11-14 DIAGNOSIS — G8929 Other chronic pain: Secondary | ICD-10-CM | POA: Diagnosis not present

## 2017-11-14 DIAGNOSIS — E1159 Type 2 diabetes mellitus with other circulatory complications: Secondary | ICD-10-CM | POA: Diagnosis not present

## 2017-11-14 DIAGNOSIS — D509 Iron deficiency anemia, unspecified: Secondary | ICD-10-CM | POA: Diagnosis not present

## 2017-11-14 DIAGNOSIS — I10 Essential (primary) hypertension: Secondary | ICD-10-CM | POA: Diagnosis not present

## 2017-11-14 DIAGNOSIS — E538 Deficiency of other specified B group vitamins: Secondary | ICD-10-CM | POA: Diagnosis not present

## 2017-11-14 DIAGNOSIS — Z7984 Long term (current) use of oral hypoglycemic drugs: Secondary | ICD-10-CM | POA: Diagnosis not present

## 2017-11-14 DIAGNOSIS — I447 Left bundle-branch block, unspecified: Secondary | ICD-10-CM | POA: Diagnosis not present

## 2017-11-15 DIAGNOSIS — R350 Frequency of micturition: Secondary | ICD-10-CM | POA: Diagnosis not present

## 2017-11-15 DIAGNOSIS — Z45018 Encounter for adjustment and management of other part of cardiac pacemaker: Secondary | ICD-10-CM | POA: Diagnosis not present

## 2017-11-15 DIAGNOSIS — Z950 Presence of cardiac pacemaker: Secondary | ICD-10-CM | POA: Diagnosis not present

## 2017-11-15 DIAGNOSIS — I442 Atrioventricular block, complete: Secondary | ICD-10-CM | POA: Diagnosis not present

## 2017-11-16 DIAGNOSIS — I447 Left bundle-branch block, unspecified: Secondary | ICD-10-CM | POA: Diagnosis not present

## 2017-11-16 DIAGNOSIS — I442 Atrioventricular block, complete: Secondary | ICD-10-CM | POA: Diagnosis not present

## 2017-11-16 DIAGNOSIS — E538 Deficiency of other specified B group vitamins: Secondary | ICD-10-CM | POA: Diagnosis not present

## 2017-11-16 DIAGNOSIS — M5440 Lumbago with sciatica, unspecified side: Secondary | ICD-10-CM | POA: Diagnosis not present

## 2017-11-16 DIAGNOSIS — L4050 Arthropathic psoriasis, unspecified: Secondary | ICD-10-CM | POA: Diagnosis not present

## 2017-11-16 DIAGNOSIS — I10 Essential (primary) hypertension: Secondary | ICD-10-CM | POA: Diagnosis not present

## 2017-11-16 DIAGNOSIS — G8929 Other chronic pain: Secondary | ICD-10-CM | POA: Diagnosis not present

## 2017-11-16 DIAGNOSIS — E1151 Type 2 diabetes mellitus with diabetic peripheral angiopathy without gangrene: Secondary | ICD-10-CM | POA: Diagnosis not present

## 2017-11-16 DIAGNOSIS — D509 Iron deficiency anemia, unspecified: Secondary | ICD-10-CM | POA: Diagnosis not present

## 2017-11-16 DIAGNOSIS — Z7984 Long term (current) use of oral hypoglycemic drugs: Secondary | ICD-10-CM | POA: Diagnosis not present

## 2017-11-16 DIAGNOSIS — E785 Hyperlipidemia, unspecified: Secondary | ICD-10-CM | POA: Diagnosis not present

## 2017-11-16 DIAGNOSIS — E1159 Type 2 diabetes mellitus with other circulatory complications: Secondary | ICD-10-CM | POA: Diagnosis not present

## 2017-11-17 DIAGNOSIS — E1159 Type 2 diabetes mellitus with other circulatory complications: Secondary | ICD-10-CM | POA: Diagnosis not present

## 2017-11-17 DIAGNOSIS — M5440 Lumbago with sciatica, unspecified side: Secondary | ICD-10-CM | POA: Diagnosis not present

## 2017-11-17 DIAGNOSIS — E785 Hyperlipidemia, unspecified: Secondary | ICD-10-CM | POA: Diagnosis not present

## 2017-11-17 DIAGNOSIS — E538 Deficiency of other specified B group vitamins: Secondary | ICD-10-CM | POA: Diagnosis not present

## 2017-11-17 DIAGNOSIS — E1151 Type 2 diabetes mellitus with diabetic peripheral angiopathy without gangrene: Secondary | ICD-10-CM | POA: Diagnosis not present

## 2017-11-17 DIAGNOSIS — I442 Atrioventricular block, complete: Secondary | ICD-10-CM | POA: Diagnosis not present

## 2017-11-17 DIAGNOSIS — G8929 Other chronic pain: Secondary | ICD-10-CM | POA: Diagnosis not present

## 2017-11-17 DIAGNOSIS — I447 Left bundle-branch block, unspecified: Secondary | ICD-10-CM | POA: Diagnosis not present

## 2017-11-17 DIAGNOSIS — D509 Iron deficiency anemia, unspecified: Secondary | ICD-10-CM | POA: Diagnosis not present

## 2017-11-17 DIAGNOSIS — L4050 Arthropathic psoriasis, unspecified: Secondary | ICD-10-CM | POA: Diagnosis not present

## 2017-11-17 DIAGNOSIS — Z7984 Long term (current) use of oral hypoglycemic drugs: Secondary | ICD-10-CM | POA: Diagnosis not present

## 2017-11-17 DIAGNOSIS — I10 Essential (primary) hypertension: Secondary | ICD-10-CM | POA: Diagnosis not present

## 2017-11-18 DIAGNOSIS — M5440 Lumbago with sciatica, unspecified side: Secondary | ICD-10-CM | POA: Diagnosis not present

## 2017-11-18 DIAGNOSIS — L4050 Arthropathic psoriasis, unspecified: Secondary | ICD-10-CM | POA: Diagnosis not present

## 2017-11-18 DIAGNOSIS — I10 Essential (primary) hypertension: Secondary | ICD-10-CM | POA: Diagnosis not present

## 2017-11-18 DIAGNOSIS — E1159 Type 2 diabetes mellitus with other circulatory complications: Secondary | ICD-10-CM | POA: Diagnosis not present

## 2017-11-18 DIAGNOSIS — Z7984 Long term (current) use of oral hypoglycemic drugs: Secondary | ICD-10-CM | POA: Diagnosis not present

## 2017-11-18 DIAGNOSIS — G8929 Other chronic pain: Secondary | ICD-10-CM | POA: Diagnosis not present

## 2017-11-18 DIAGNOSIS — D509 Iron deficiency anemia, unspecified: Secondary | ICD-10-CM | POA: Diagnosis not present

## 2017-11-18 DIAGNOSIS — I447 Left bundle-branch block, unspecified: Secondary | ICD-10-CM | POA: Diagnosis not present

## 2017-11-18 DIAGNOSIS — E1151 Type 2 diabetes mellitus with diabetic peripheral angiopathy without gangrene: Secondary | ICD-10-CM | POA: Diagnosis not present

## 2017-11-18 DIAGNOSIS — I442 Atrioventricular block, complete: Secondary | ICD-10-CM | POA: Diagnosis not present

## 2017-11-18 DIAGNOSIS — E538 Deficiency of other specified B group vitamins: Secondary | ICD-10-CM | POA: Diagnosis not present

## 2017-11-18 DIAGNOSIS — E785 Hyperlipidemia, unspecified: Secondary | ICD-10-CM | POA: Diagnosis not present

## 2017-11-21 ENCOUNTER — Other Ambulatory Visit: Payer: Self-pay | Admitting: Family Medicine

## 2017-11-21 ENCOUNTER — Telehealth: Payer: Self-pay | Admitting: Family Medicine

## 2017-11-21 DIAGNOSIS — G8929 Other chronic pain: Secondary | ICD-10-CM | POA: Diagnosis not present

## 2017-11-21 DIAGNOSIS — Z7984 Long term (current) use of oral hypoglycemic drugs: Secondary | ICD-10-CM | POA: Diagnosis not present

## 2017-11-21 DIAGNOSIS — E538 Deficiency of other specified B group vitamins: Secondary | ICD-10-CM | POA: Diagnosis not present

## 2017-11-21 DIAGNOSIS — E1159 Type 2 diabetes mellitus with other circulatory complications: Secondary | ICD-10-CM | POA: Diagnosis not present

## 2017-11-21 DIAGNOSIS — E785 Hyperlipidemia, unspecified: Secondary | ICD-10-CM | POA: Diagnosis not present

## 2017-11-21 DIAGNOSIS — I442 Atrioventricular block, complete: Secondary | ICD-10-CM | POA: Diagnosis not present

## 2017-11-21 DIAGNOSIS — L4050 Arthropathic psoriasis, unspecified: Secondary | ICD-10-CM | POA: Diagnosis not present

## 2017-11-21 DIAGNOSIS — M5440 Lumbago with sciatica, unspecified side: Secondary | ICD-10-CM | POA: Diagnosis not present

## 2017-11-21 DIAGNOSIS — I10 Essential (primary) hypertension: Secondary | ICD-10-CM | POA: Diagnosis not present

## 2017-11-21 DIAGNOSIS — D509 Iron deficiency anemia, unspecified: Secondary | ICD-10-CM | POA: Diagnosis not present

## 2017-11-21 DIAGNOSIS — E1151 Type 2 diabetes mellitus with diabetic peripheral angiopathy without gangrene: Secondary | ICD-10-CM | POA: Diagnosis not present

## 2017-11-21 DIAGNOSIS — I447 Left bundle-branch block, unspecified: Secondary | ICD-10-CM | POA: Diagnosis not present

## 2017-11-21 MED ORDER — HYDROCODONE-ACETAMINOPHEN 5-325 MG PO TABS: 1.0000 | ORAL_TABLET | Freq: Four times a day (QID) | ORAL | 0 refills | Status: DC | PRN

## 2017-11-21 NOTE — Telephone Encounter (Signed)
Please advise on refill.

## 2017-11-21 NOTE — Telephone Encounter (Signed)
Copied from [place] [id number]. Topic: Quick Communication - Rx Refill/Question >> Nov 21, 2017 10:34 AM Scherrie Gerlach wrote: Medication: HYDROcodone-acetaminophen (NORCO/VICODIN) 5-325 MG tablet Has the patient contacted their pharmacy? {yes, but pt states the pharmacy advised her to cal the dr  [facility] [address], [place] - [address] [phone] (Phone) [phone] (Fax)

## 2017-11-21 NOTE — Telephone Encounter (Signed)
Copied from [place] [id number]. Topic: Quick Communication - See Telephone Encounter >> Nov 21, 2017 11:05 AM Krista Clarke wrote: CRM for notification. See Telephone encounter for: 11/21/17. Krista Clarke from Home health states she is faxing to the office today an order for a 3 in 1 commode and needs it faxed back to [facility] asap as the pt has been waiting for this for a month.  The fax number to fax it to is [phone] and Krista Clarke's contact number is [phone]

## 2017-11-21 NOTE — Telephone Encounter (Signed)
See note

## 2017-11-21 NOTE — Telephone Encounter (Signed)
Received and faxed

## 2017-11-23 ENCOUNTER — Other Ambulatory Visit: Payer: Self-pay | Admitting: Surgical

## 2017-11-23 DIAGNOSIS — I10 Essential (primary) hypertension: Secondary | ICD-10-CM | POA: Diagnosis not present

## 2017-11-23 DIAGNOSIS — M5440 Lumbago with sciatica, unspecified side: Secondary | ICD-10-CM | POA: Diagnosis not present

## 2017-11-23 DIAGNOSIS — E1159 Type 2 diabetes mellitus with other circulatory complications: Secondary | ICD-10-CM | POA: Diagnosis not present

## 2017-11-23 DIAGNOSIS — I447 Left bundle-branch block, unspecified: Secondary | ICD-10-CM | POA: Diagnosis not present

## 2017-11-23 DIAGNOSIS — I442 Atrioventricular block, complete: Secondary | ICD-10-CM | POA: Diagnosis not present

## 2017-11-23 DIAGNOSIS — G8929 Other chronic pain: Secondary | ICD-10-CM | POA: Diagnosis not present

## 2017-11-23 DIAGNOSIS — L4050 Arthropathic psoriasis, unspecified: Secondary | ICD-10-CM | POA: Diagnosis not present

## 2017-11-23 DIAGNOSIS — E785 Hyperlipidemia, unspecified: Secondary | ICD-10-CM | POA: Diagnosis not present

## 2017-11-23 DIAGNOSIS — D509 Iron deficiency anemia, unspecified: Secondary | ICD-10-CM | POA: Diagnosis not present

## 2017-11-23 DIAGNOSIS — Z7984 Long term (current) use of oral hypoglycemic drugs: Secondary | ICD-10-CM | POA: Diagnosis not present

## 2017-11-23 DIAGNOSIS — E1151 Type 2 diabetes mellitus with diabetic peripheral angiopathy without gangrene: Secondary | ICD-10-CM | POA: Diagnosis not present

## 2017-11-23 DIAGNOSIS — E538 Deficiency of other specified B group vitamins: Secondary | ICD-10-CM | POA: Diagnosis not present

## 2017-11-23 MED ORDER — METFORMIN HCL 1000 MG PO TABS: 1000.0000 mg | ORAL_TABLET | Freq: Two times a day (BID) | ORAL | 11 refills | Status: DC

## 2017-11-24 DIAGNOSIS — Z950 Presence of cardiac pacemaker: Secondary | ICD-10-CM | POA: Diagnosis not present

## 2017-11-24 DIAGNOSIS — E118 Type 2 diabetes mellitus with unspecified complications: Secondary | ICD-10-CM | POA: Diagnosis not present

## 2017-11-24 DIAGNOSIS — I10 Essential (primary) hypertension: Secondary | ICD-10-CM | POA: Diagnosis not present

## 2017-11-24 DIAGNOSIS — Z45018 Encounter for adjustment and management of other part of cardiac pacemaker: Secondary | ICD-10-CM | POA: Diagnosis not present

## 2017-11-28 DIAGNOSIS — E1151 Type 2 diabetes mellitus with diabetic peripheral angiopathy without gangrene: Secondary | ICD-10-CM | POA: Diagnosis not present

## 2017-11-28 DIAGNOSIS — I10 Essential (primary) hypertension: Secondary | ICD-10-CM | POA: Diagnosis not present

## 2017-11-28 DIAGNOSIS — L4050 Arthropathic psoriasis, unspecified: Secondary | ICD-10-CM | POA: Diagnosis not present

## 2017-11-28 DIAGNOSIS — E538 Deficiency of other specified B group vitamins: Secondary | ICD-10-CM | POA: Diagnosis not present

## 2017-11-28 DIAGNOSIS — E1159 Type 2 diabetes mellitus with other circulatory complications: Secondary | ICD-10-CM | POA: Diagnosis not present

## 2017-11-28 DIAGNOSIS — E785 Hyperlipidemia, unspecified: Secondary | ICD-10-CM | POA: Diagnosis not present

## 2017-11-28 DIAGNOSIS — I442 Atrioventricular block, complete: Secondary | ICD-10-CM | POA: Diagnosis not present

## 2017-11-28 DIAGNOSIS — Z7984 Long term (current) use of oral hypoglycemic drugs: Secondary | ICD-10-CM | POA: Diagnosis not present

## 2017-11-28 DIAGNOSIS — I447 Left bundle-branch block, unspecified: Secondary | ICD-10-CM | POA: Diagnosis not present

## 2017-11-28 DIAGNOSIS — M5440 Lumbago with sciatica, unspecified side: Secondary | ICD-10-CM | POA: Diagnosis not present

## 2017-11-28 DIAGNOSIS — G8929 Other chronic pain: Secondary | ICD-10-CM | POA: Diagnosis not present

## 2017-11-28 DIAGNOSIS — D509 Iron deficiency anemia, unspecified: Secondary | ICD-10-CM | POA: Diagnosis not present

## 2017-11-29 DIAGNOSIS — E1151 Type 2 diabetes mellitus with diabetic peripheral angiopathy without gangrene: Secondary | ICD-10-CM | POA: Diagnosis not present

## 2017-11-29 DIAGNOSIS — M5440 Lumbago with sciatica, unspecified side: Secondary | ICD-10-CM | POA: Diagnosis not present

## 2017-11-29 DIAGNOSIS — E1159 Type 2 diabetes mellitus with other circulatory complications: Secondary | ICD-10-CM | POA: Diagnosis not present

## 2017-11-29 DIAGNOSIS — I442 Atrioventricular block, complete: Secondary | ICD-10-CM | POA: Diagnosis not present

## 2017-11-29 DIAGNOSIS — L4050 Arthropathic psoriasis, unspecified: Secondary | ICD-10-CM | POA: Diagnosis not present

## 2017-11-29 DIAGNOSIS — I10 Essential (primary) hypertension: Secondary | ICD-10-CM | POA: Diagnosis not present

## 2017-11-29 DIAGNOSIS — E538 Deficiency of other specified B group vitamins: Secondary | ICD-10-CM | POA: Diagnosis not present

## 2017-11-29 DIAGNOSIS — I447 Left bundle-branch block, unspecified: Secondary | ICD-10-CM | POA: Diagnosis not present

## 2017-11-29 DIAGNOSIS — G8929 Other chronic pain: Secondary | ICD-10-CM | POA: Diagnosis not present

## 2017-11-29 DIAGNOSIS — E785 Hyperlipidemia, unspecified: Secondary | ICD-10-CM | POA: Diagnosis not present

## 2017-11-29 DIAGNOSIS — Z7984 Long term (current) use of oral hypoglycemic drugs: Secondary | ICD-10-CM | POA: Diagnosis not present

## 2017-11-29 DIAGNOSIS — D509 Iron deficiency anemia, unspecified: Secondary | ICD-10-CM | POA: Diagnosis not present

## 2017-12-01 ENCOUNTER — Ambulatory Visit: Payer: Self-pay | Admitting: *Deleted

## 2017-12-01 DIAGNOSIS — I442 Atrioventricular block, complete: Secondary | ICD-10-CM | POA: Diagnosis not present

## 2017-12-01 DIAGNOSIS — D509 Iron deficiency anemia, unspecified: Secondary | ICD-10-CM | POA: Diagnosis not present

## 2017-12-01 DIAGNOSIS — G8929 Other chronic pain: Secondary | ICD-10-CM | POA: Diagnosis not present

## 2017-12-01 DIAGNOSIS — E785 Hyperlipidemia, unspecified: Secondary | ICD-10-CM | POA: Diagnosis not present

## 2017-12-01 DIAGNOSIS — I447 Left bundle-branch block, unspecified: Secondary | ICD-10-CM | POA: Diagnosis not present

## 2017-12-01 DIAGNOSIS — L4050 Arthropathic psoriasis, unspecified: Secondary | ICD-10-CM | POA: Diagnosis not present

## 2017-12-01 DIAGNOSIS — E538 Deficiency of other specified B group vitamins: Secondary | ICD-10-CM | POA: Diagnosis not present

## 2017-12-01 DIAGNOSIS — I10 Essential (primary) hypertension: Secondary | ICD-10-CM | POA: Diagnosis not present

## 2017-12-01 DIAGNOSIS — Z7984 Long term (current) use of oral hypoglycemic drugs: Secondary | ICD-10-CM | POA: Diagnosis not present

## 2017-12-01 DIAGNOSIS — E1151 Type 2 diabetes mellitus with diabetic peripheral angiopathy without gangrene: Secondary | ICD-10-CM | POA: Diagnosis not present

## 2017-12-01 DIAGNOSIS — E1159 Type 2 diabetes mellitus with other circulatory complications: Secondary | ICD-10-CM | POA: Diagnosis not present

## 2017-12-01 DIAGNOSIS — M5440 Lumbago with sciatica, unspecified side: Secondary | ICD-10-CM | POA: Diagnosis not present

## 2017-12-01 NOTE — Telephone Encounter (Signed)
Home  Health  Nurse  Came  To  Patients  Home  Today  And  Was  Concerned  About  The  Swelling   In her  r  Leg . Pt  Reports  Has  Had swelling in past  But is  Worse   Today  - this am. Pt denies any injury.  Reason for Disposition . [1] MODERATE leg swelling (e.g., swelling extends up to knees) AND [2] new onset or worsening  Answer Assessment - Initial Assessment Questions 1. ONSET: "When did the swelling start?" (e.g., minutes, hours, days)      Has   Had   Swelling  In  Past    Got worse  This  Am    2. LOCATION: "What part of the leg is swollen?"  "Are both legs swollen or just one leg?"     Both  Legs  Are  Swollen   The  Right is  Much more  Swollen  Than the  Left    3. SEVERITY: "How bad is the swelling?" (e.g., localized; mild, moderate, severe)  - Localized - small area of swelling localized to one leg  - MILD pedal edema - swelling limited to foot and ankle, pitting edema < 1/4 inch (6 mm) deep, rest and elevation eliminate most or all swelling  - MODERATE edema - swelling of lower leg to knee, pitting edema > 1/4 inch (6 mm) deep, rest and elevation only partially reduce swelling  - SEVERE edema - swelling extends above knee, facial or hand swelling present         Moderate  Swelling  Stops   At the  Knee      4. REDNESS: "Does the swelling look red or infected?"     NO 5. PAIN: "Is the swelling painful to touch?" If so, ask: "How painful is it?"   (Scale 1-10; mild, moderate or severe)        7  Sore  When she  Touches  It   6. FEVER: "Do you have a fever?" If so, ask: "What is it, how was it measured, and when did it start?"        NO 7. CAUSE: "What do you think is causing the leg swelling?"        No idea      8. MEDICAL HISTORY: "Do you have a history of heart failure, kidney disease, liver failure, or cancer?"      History  Of  Leg  Swelling    9. RECURRENT SYMPTOM: "Have you had leg swelling before?" If so, ask: "When was the last time?" "What happened that time?"   Yes    The  [place]  At it today  And  Thought  She  Needed  To  Be  evaluated  For  It   10. OTHER SYMPTOMS: "Do you have any other symptoms?" (e.g., chest pain, difficulty breathing)         No   And  Denies  Any  Injury    11. PREGNANCY: "Is there any chance you are pregnant?" "When was your last menstrual period?"       No  Protocols used: LEG SWELLING AND EDEMA-A-AH

## 2017-12-01 NOTE — Progress Notes (Signed)
Krista Clarke is a 77 y.o. female is here for follow up.  History of Present Illness:   HPI: This very sweet patient presents today for evaluation of lower extremity edema.  This is ongoing.  She is up 1 pound since her last visit.  Recently her labs are checked and her creatinine was normal.  She does take Lasix 20 mg p.o. daily with potassium.  She denies any lower extremity pain.  She denies any food indiscretions.  Blood pressure is at goal.   Health Maintenance Due  Topic Date Due  . TETANUS/TDAP  08/30/1958  . PNA vac Low Risk Adult (1 of 2 - PCV13) 08/30/2004   Depression screen PHQ 2/9 04/28/2017 03/25/2017 10/01/2016  Decreased Interest 0 0 0  Down, Depressed, Hopeless 0 0 0  PHQ - 2 Score 0 0 0   PMHx, SurgHx, SocialHx, FamHx, Medications, and Allergies were reviewed in the Visit Navigator and updated as appropriate.   Patient Active Problem List   Diagnosis Date Noted  . LBBB (left bundle branch block) 11/18/2016  . Pacemaker 11/18/2016  . PAD (peripheral artery disease) ([place]) 11/18/2016  . 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 Chronic midline low back pain with bilateral sciatica 10/02/2016  . Paresthesia of both lower extremities 10/02/2016  . Obesity, Class II, BMI 35-39.9, with comorbidity 10/02/2016  . HLD (hyperlipidemia) 10/02/2016  . Hypertension associated with diabetes ([place]) 03/08/2016  . Anemia, iron deficiency 03/08/2016  . Diabetes mellitus type 2 in obese ([place]) 03/08/2016  . Complete heart block ([place]) 08/20/2015  . Angina pectoris ([place]) 08/26/2014   Social History   Tobacco Use  . Smoking status: Never Smoker  . Smokeless tobacco: Never Used  Substance Use Topics  . Alcohol use: No  . Drug use: No   Current Medications and Allergies:   .  acetaminophen (TYLENOL) 500 MG tablet, Take 1,000 mg by mouth every 6 (six) hours as needed. Reported on 08/20/2015, Disp: , Rfl:  .  allopurinol (ZYLOPRIM) 100 MG tablet, TAKE 1 TABLET BY MOUTH EVERY  MORNING, Disp: 90 tablet, Rfl: 0 .  allopurinol (ZYLOPRIM) 100 MG tablet, TAKE 1 TABLET BY MOUTH EVERY MORNING, Disp: 30 tablet, Rfl: 2 .  amLODipine (NORVASC) 10 MG tablet, , Disp: , Rfl:  .  aspirin 81 MG chewable tablet, Chew by mouth daily., Disp: , Rfl:  .  atorvastatin (LIPITOR) 40 MG tablet, Take 40 mg by mouth daily. , Disp: , Rfl:  .  Calcium Citrate-Vitamin D (CALCIUM CITRATE + D PO), Take 1 tablet by mouth daily., Disp: , Rfl:  .  clopidogrel (PLAVIX) 75 MG tablet, Take 1 tablet (75 mg total) by mouth daily., Disp: 90 tablet, Rfl: 0 .  ferrous sulfate 325 (65 FE) MG EC tablet, Take 325 mg by mouth 2 (two) times daily., Disp: , Rfl:  .  furosemide (LASIX) 20 MG tablet, TAKE 1 TABLET BY MOUTH ONCE DAILY, Disp: 20 tablet, Rfl: 0 .  gabapentin (NEURONTIN) 100 MG capsule, TAKE 2 CAPSULES BY MOUTH THREE TIMES DAILY, Disp: 180 capsule, Rfl: 3 .  hydrochlorothiazide (MICROZIDE) 12.5 MG capsule, TAKE 1 CAPSULE BY MOUTH  DAILY, Disp: 90 capsule, Rfl: 1 .  HYDROcodone-acetaminophen (NORCO/VICODIN) 5-325 MG tablet, Take 1 tablet by mouth 4 (four) times daily as needed (Chronic Pain)., Disp: 120 tablet, Rfl: 0 .  KLOR-CON M20 20 MEQ tablet, TAKE 1 TABLET BY MOUTH DAILY, Disp: 20 tablet, Rfl: 0 .  losartan-hydrochlorothiazide (HYZAAR) 50-12.5 MG tablet, Take 1 tablet  by mouth daily., Disp: 90 tablet, Rfl: 3 .  Lutein 20 MG TABS, Take by mouth daily. , Disp: , Rfl:  .  magnesium oxide (MAG-OX) 400 MG tablet, Take 400 mg by mouth daily., Disp: , Rfl:  .  meclizine (ANTIVERT) 25 MG tablet, Take 1 tablet (25 mg total) by mouth 2 (two) times daily. Take no more than 4 times per week., Disp: 60 tablet, Rfl: 0 .  metFORMIN (GLUCOPHAGE) 1000 MG tablet, Take 1 tablet (1,000 mg total) by mouth 2 (two) times daily with a meal., Disp: 60 tablet, Rfl: 11 .  metoprolol tartrate (LOPRESSOR) 50 MG tablet, , Disp: , Rfl:  .  vitamin C (ASCORBIC ACID) 500 MG tablet, Take 500 mg by mouth daily., Disp: , Rfl:  .   Vorapaxar Sulfate (ZONTIVITY) 2.08 MG TABS, Take 1 tablet by mouth daily., Disp: 30 tablet, Rfl: 0   Allergies  Allergen Reactions  . Prednisone Palpitations and Other (See Comments)    Doesn't work well for patient   Review of Systems   Pertinent items are noted in the HPI. Otherwise, ROS is negative.  Vitals:   Vitals:   12/02/17 1300  BP: 140/68  Pulse: 82  Temp: 97.6 F (36.4 C)  TempSrc: Oral  SpO2: 97%  Weight: 217 lb (98.4 kg)  Height: 5\' 3"  (1.6 m)     Body mass index is 38.44 kg/m.   Physical Exam:   Physical Exam  Constitutional: She is oriented to person, place, and time. She appears well-developed and well-nourished. No distress.  HENT:  Head: Normocephalic and atraumatic.  Eyes: Pupils are equal, round, and reactive to light. Conjunctivae and EOM are normal.  Neck: Normal range of motion. Neck supple. No thyromegaly present.  Cardiovascular: Normal rate, regular rhythm and intact distal pulses.  Pulmonary/Chest: Effort normal and breath sounds normal.  Abdominal: Soft.  Musculoskeletal:  Nonpitting edema only to bilateral feet.  Otherwise at baseline and not concerning.  Neurological: She is alert and oriented to person, place, and time.  Skin: Skin is warm.  Psychiatric: She has a normal mood and affect. Her behavior is normal.  Nursing note and vitals reviewed.    Results for orders placed or performed in visit on 10/04/17  Urine Culture  Result Value Ref Range   MICRO NUMBER: [id number]    SPECIMEN QUALITY: ADEQUATE    Sample Source NOT GIVEN    STATUS: FINAL    Result:      Three or more organisms present, each greater than 10,000 cu/mL. May represent normal flora contamination from external genitalia. No further testing is required.  CBC with Differential/Platelet  Result Value Ref Range   WBC 5.8 4.0 - 10.5 K/uL   RBC 4.07 3.87 - 5.11 Mil/uL   Hemoglobin 10.0 (L) 12.0 - 15.0 g/dL   HCT 31.8 (L) 36.0 - 46.0 %   MCV 78.3 78.0 - 100.0 fl    MCHC 31.5 30.0 - 36.0 g/dL   RDW 15.4 11.5 - 15.5 %   Platelets 260.0 150.0 - 400.0 K/uL   Neutrophils Relative % 71.4 43.0 - 77.0 %   Lymphocytes Relative 17.5 12.0 - 46.0 %   Monocytes Relative 8.5 3.0 - 12.0 %   Eosinophils Relative 2.2 0.0 - 5.0 %   Basophils Relative 0.4 0.0 - 3.0 %   Neutro Abs 4.1 1.4 - 7.7 K/uL   Lymphs Abs 1.0 0.7 - 4.0 K/uL   Monocytes Absolute 0.5 0.1 - 1.0 K/uL   Eosinophils  Absolute 0.1 0.0 - 0.7 K/uL   Basophils Absolute 0.0 0.0 - 0.1 K/uL  Comprehensive metabolic panel  Result Value Ref Range   Sodium 138 135 - 145 mEq/L   Potassium 4.4 3.5 - 5.1 mEq/L   Chloride 101 96 - 112 mEq/L   CO2 27 19 - 32 mEq/L   Glucose, Bld 161 (H) 70 - 99 mg/dL   BUN 24 (H) 6 - 23 mg/dL   Creatinine, Ser 0.97 0.40 - 1.20 mg/dL   Total Bilirubin 0.4 0.2 - 1.2 mg/dL   Alkaline Phosphatase 74 39 - 117 U/L   AST 11 0 - 37 U/L   ALT 10 0 - 35 U/L   Total Protein 7.5 6.0 - 8.3 g/dL   Albumin 4.1 3.5 - 5.2 g/dL   Calcium 9.5 8.4 - 10.5 mg/dL   GFR 71.41 >60.00 mL/min  Hemoglobin A1c  Result Value Ref Range   Hgb A1c MFr Bld 8.4 (H) 4.6 - 6.5 %  Urinalysis, Routine w reflex microscopic  Result Value Ref Range   Color, Urine YELLOW Yellow;Lt. Yellow   APPearance Sl Cloudy (A) Clear   Specific Gravity, Urine 1.015 1.000 - 1.030   pH 6.0 5.0 - 8.0   Total Protein, Urine NEGATIVE Negative   Urine Glucose NEGATIVE Negative   Ketones, ur TRACE (A) Negative   Bilirubin Urine NEGATIVE Negative   Hgb urine dipstick NEGATIVE Negative   Urobilinogen, UA 0.2 0.0 - 1.0   Leukocytes, UA SMALL (A) Negative   Nitrite NEGATIVE Negative   WBC, UA 7-10/hpf (A) 0-2/hpf   RBC / HPF none seen 0-2/hpf   Mucus, UA Presence of (A) None   Squamous Epithelial / LPF Few(5-10/hpf) (A) Rare(0-4/hpf)   Bacteria, UA Few(10-50/hpf) (A) None   Yeast, UA Presence of (A) None  B12  Result Value Ref Range   Vitamin B-12 500 211 - 911 pg/mL   Assessment and Plan:   Zoha was seen today for  edema.  Diagnoses and all orders for this visit:  Bilateral lower extremity edema Comments: No concerns today.  See AVS for instructions.    . Reviewed expectations re: course of current medical issues. . Discussed self-management of symptoms. . Outlined signs and symptoms indicating need for more acute intervention. . Patient verbalized understanding and all questions were answered. Marland Kitchen Health Maintenance issues including appropriate healthy diet, exercise, and smoking avoidance were discussed with patient. . See orders for this visit as documented in the electronic medical record. . Patient received an After Visit Summary.  Briscoe Deutscher, DO [place] 12/02/2017  Future Appointments  Date Time Provider [place]  12/02/2017  1:40 PM Briscoe Deutscher, DO [facility]  12/30/2017 10:00 AM Gardiner Barefoot, DPM [facility]  01/03/2018 11:20 AM Briscoe Deutscher, DO [facility]  02/07/2018 11:20 AM Briscoe Deutscher, DO [facility]  06/22/2018 10:00 AM Williemae Area, RN [facility]  06/22/2018 11:00 AM [facility]  06/29/2018 10:00 AM Briscoe Deutscher, DO [facility]

## 2017-12-02 ENCOUNTER — Other Ambulatory Visit: Payer: Self-pay | Admitting: Family Medicine

## 2017-12-02 ENCOUNTER — Ambulatory Visit (INDEPENDENT_AMBULATORY_CARE_PROVIDER_SITE_OTHER): Payer: Medicare Other | Admitting: Family Medicine

## 2017-12-02 ENCOUNTER — Encounter: Payer: Self-pay | Admitting: Family Medicine

## 2017-12-02 VITALS — BP 140/68 | HR 82 | Temp 97.6°F | Ht 63.0 in | Wt 217.0 lb

## 2017-12-02 DIAGNOSIS — R600 Localized edema: Secondary | ICD-10-CM | POA: Diagnosis not present

## 2017-12-02 NOTE — Patient Instructions (Signed)
You look great.  Go ahead and take an extra Lasix with potassium tomorrow and the next day.  Make sure to weigh yourself daily and let me know if you gain more than 3 pounds.

## 2017-12-02 NOTE — Telephone Encounter (Signed)
FYI  Patient is scheduled for today

## 2017-12-05 ENCOUNTER — Other Ambulatory Visit: Payer: Self-pay | Admitting: Family Medicine

## 2017-12-05 DIAGNOSIS — D509 Iron deficiency anemia, unspecified: Secondary | ICD-10-CM | POA: Diagnosis not present

## 2017-12-05 DIAGNOSIS — M5440 Lumbago with sciatica, unspecified side: Secondary | ICD-10-CM | POA: Diagnosis not present

## 2017-12-05 DIAGNOSIS — L4050 Arthropathic psoriasis, unspecified: Secondary | ICD-10-CM | POA: Diagnosis not present

## 2017-12-05 DIAGNOSIS — E1151 Type 2 diabetes mellitus with diabetic peripheral angiopathy without gangrene: Secondary | ICD-10-CM | POA: Diagnosis not present

## 2017-12-05 DIAGNOSIS — I447 Left bundle-branch block, unspecified: Secondary | ICD-10-CM | POA: Diagnosis not present

## 2017-12-05 DIAGNOSIS — Z7984 Long term (current) use of oral hypoglycemic drugs: Secondary | ICD-10-CM | POA: Diagnosis not present

## 2017-12-05 DIAGNOSIS — G8929 Other chronic pain: Secondary | ICD-10-CM | POA: Diagnosis not present

## 2017-12-05 DIAGNOSIS — E538 Deficiency of other specified B group vitamins: Secondary | ICD-10-CM | POA: Diagnosis not present

## 2017-12-05 DIAGNOSIS — I442 Atrioventricular block, complete: Secondary | ICD-10-CM | POA: Diagnosis not present

## 2017-12-05 DIAGNOSIS — E785 Hyperlipidemia, unspecified: Secondary | ICD-10-CM | POA: Diagnosis not present

## 2017-12-05 DIAGNOSIS — E1159 Type 2 diabetes mellitus with other circulatory complications: Secondary | ICD-10-CM | POA: Diagnosis not present

## 2017-12-05 DIAGNOSIS — I10 Essential (primary) hypertension: Secondary | ICD-10-CM | POA: Diagnosis not present

## 2017-12-15 DIAGNOSIS — D509 Iron deficiency anemia, unspecified: Secondary | ICD-10-CM | POA: Diagnosis not present

## 2017-12-15 DIAGNOSIS — E538 Deficiency of other specified B group vitamins: Secondary | ICD-10-CM | POA: Diagnosis not present

## 2017-12-15 DIAGNOSIS — E1159 Type 2 diabetes mellitus with other circulatory complications: Secondary | ICD-10-CM | POA: Diagnosis not present

## 2017-12-15 DIAGNOSIS — Z7902 Long term (current) use of antithrombotics/antiplatelets: Secondary | ICD-10-CM | POA: Diagnosis not present

## 2017-12-15 DIAGNOSIS — I447 Left bundle-branch block, unspecified: Secondary | ICD-10-CM | POA: Diagnosis not present

## 2017-12-15 DIAGNOSIS — E1151 Type 2 diabetes mellitus with diabetic peripheral angiopathy without gangrene: Secondary | ICD-10-CM | POA: Diagnosis not present

## 2017-12-15 DIAGNOSIS — G8929 Other chronic pain: Secondary | ICD-10-CM | POA: Diagnosis not present

## 2017-12-15 DIAGNOSIS — Z9181 History of falling: Secondary | ICD-10-CM | POA: Diagnosis not present

## 2017-12-15 DIAGNOSIS — Z950 Presence of cardiac pacemaker: Secondary | ICD-10-CM | POA: Diagnosis not present

## 2017-12-15 DIAGNOSIS — M5440 Lumbago with sciatica, unspecified side: Secondary | ICD-10-CM | POA: Diagnosis not present

## 2017-12-15 DIAGNOSIS — Z7984 Long term (current) use of oral hypoglycemic drugs: Secondary | ICD-10-CM | POA: Diagnosis not present

## 2017-12-15 DIAGNOSIS — I10 Essential (primary) hypertension: Secondary | ICD-10-CM | POA: Diagnosis not present

## 2017-12-15 DIAGNOSIS — E785 Hyperlipidemia, unspecified: Secondary | ICD-10-CM | POA: Diagnosis not present

## 2017-12-15 DIAGNOSIS — Z87440 Personal history of urinary (tract) infections: Secondary | ICD-10-CM | POA: Diagnosis not present

## 2017-12-15 DIAGNOSIS — L4050 Arthropathic psoriasis, unspecified: Secondary | ICD-10-CM | POA: Diagnosis not present

## 2017-12-15 DIAGNOSIS — I442 Atrioventricular block, complete: Secondary | ICD-10-CM | POA: Diagnosis not present

## 2017-12-21 DIAGNOSIS — E119 Type 2 diabetes mellitus without complications: Secondary | ICD-10-CM | POA: Diagnosis not present

## 2017-12-21 DIAGNOSIS — I10 Essential (primary) hypertension: Secondary | ICD-10-CM | POA: Diagnosis not present

## 2017-12-22 ENCOUNTER — Telehealth: Payer: Self-pay | Admitting: Family Medicine

## 2017-12-22 NOTE — Telephone Encounter (Signed)
Ok to refill 

## 2017-12-22 NOTE — Telephone Encounter (Signed)
Copied from [place] [id number]. Topic: Quick Communication - Rx Refill/Question >> Dec 22, 2017  9:49 AM Lennox Solders wrote: Medication:hydrocodone Has the patient contacted their pharmacy? no (Agent: If no, request that the patient contact the pharmacy for the refill.) Preferred Pharmacy (with phone number or street name): [facility] Agent: Please be advised that RX refills may take up to 3 business days. We ask that you follow-up with your pharmacy.

## 2017-12-23 MED ORDER — HYDROCODONE-ACETAMINOPHEN 5-325 MG PO TABS: 1.0000 | ORAL_TABLET | Freq: Four times a day (QID) | ORAL | 0 refills | Status: DC | PRN

## 2017-12-23 NOTE — Telephone Encounter (Signed)
Sent to pharmacy 

## 2017-12-30 ENCOUNTER — Ambulatory Visit: Payer: Medicare Other | Admitting: Podiatry

## 2017-12-30 ENCOUNTER — Encounter: Payer: Self-pay | Admitting: Podiatry

## 2017-12-30 VITALS — BP 135/68 | HR 80

## 2017-12-30 DIAGNOSIS — E1142 Type 2 diabetes mellitus with diabetic polyneuropathy: Secondary | ICD-10-CM | POA: Diagnosis not present

## 2017-12-30 DIAGNOSIS — E1159 Type 2 diabetes mellitus with other circulatory complications: Secondary | ICD-10-CM | POA: Diagnosis not present

## 2017-12-30 DIAGNOSIS — M2011 Hallux valgus (acquired), right foot: Secondary | ICD-10-CM | POA: Diagnosis not present

## 2017-12-30 DIAGNOSIS — D689 Coagulation defect, unspecified: Secondary | ICD-10-CM

## 2017-12-30 DIAGNOSIS — M79674 Pain in right toe(s): Secondary | ICD-10-CM | POA: Diagnosis not present

## 2017-12-30 DIAGNOSIS — M2012 Hallux valgus (acquired), left foot: Secondary | ICD-10-CM | POA: Diagnosis not present

## 2017-12-30 DIAGNOSIS — M79675 Pain in left toe(s): Secondary | ICD-10-CM | POA: Diagnosis not present

## 2017-12-30 DIAGNOSIS — B351 Tinea unguium: Secondary | ICD-10-CM | POA: Diagnosis not present

## 2017-12-30 NOTE — Progress Notes (Signed)
This patient presents to the office with chief complaint of long thick nails and diabetic feet.  This patient  says he is having pain through her feet and in both ankles.  This patient saysshe has long thick painful nails.  These nails are painful walking and wearing hshoes.  She  has no history of infection or drainage from both feet.  This patient presents the office today for treatment of the  long nails and a foot evaluation due to history of  Diabetes. Patient is taking plavix.    General Appearance  Alert, conversant and in no acute stress.  Vascular  Dorsalis pedis  are palpable  Bilaterally.  Posterior tibial pulses are absent due to ankle swelling.  Capillary return is within normal limits  bilaterally. Temperature is within normal limits  bilaterally.  Neurologic  Senn-Weinstein monofilament wire test absent  bilaterally. Muscle power within normal limits bilaterally.  Nails Thick disfigured discolored nails with subungual debris  from hallux to fifth toes bilaterally. No evidence of bacterial infection or drainage bilaterally. Nail dystrophy right hallux nail.    Orthopedic  No limitations of motion of motion feet .  No crepitus or effusions noted.  No bony pathology or digital deformities noted. 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  Skin  normotropic skin with no porokeratosis noted bilaterally.  No signs of infections or ulcers noted.     Onychomycosis  Diabetes with neuropathy  IE  Debride nails x 10.  A diabetic foot exam was performed and there is  evidence of any vascular or neurologic pathology.  This patient is concerned about her foot and I recommended she make an appointment with Liliane Channel to receive a new pair of diabetic shoes  He has not received a new pair in over 3 years.  I told her that she should receive her shoes and then we can have her evaluated and treated for her ankle pain.   RTC 3 months.   Gardiner Barefoot DPM

## 2018-01-03 ENCOUNTER — Other Ambulatory Visit: Payer: Medicare Other

## 2018-01-03 ENCOUNTER — Ambulatory Visit: Payer: Medicare Other | Admitting: Family Medicine

## 2018-01-10 ENCOUNTER — Encounter: Payer: Self-pay | Admitting: Family Medicine

## 2018-01-10 DIAGNOSIS — E119 Type 2 diabetes mellitus without complications: Secondary | ICD-10-CM | POA: Diagnosis not present

## 2018-01-10 LAB — HM DIABETES EYE EXAM

## 2018-01-16 ENCOUNTER — Encounter: Payer: Self-pay | Admitting: Podiatry

## 2018-01-18 ENCOUNTER — Telehealth: Payer: Self-pay | Admitting: *Deleted

## 2018-01-18 NOTE — Telephone Encounter (Signed)
Dr. Prudence Davidson states if pt needs an increase of the gabapentin, she needs to be evaluated by her PCP.

## 2018-01-18 NOTE — Telephone Encounter (Signed)
Left message for Krista Clarke to call for instructions concerning pt.

## 2018-01-18 NOTE — Telephone Encounter (Signed)
I informed Ms Krista Clarke - [facility], Dr. Prudence Davidson feels the pain in her feet is from the neuropathy and if the current gabapentin dose is not helping she should be evaluated for a change in dosing by her PCP.

## 2018-01-24 ENCOUNTER — Other Ambulatory Visit: Payer: Self-pay | Admitting: Family Medicine

## 2018-01-24 NOTE — Telephone Encounter (Signed)
Copied from [place] [id number]. Topic: Quick Communication - See Telephone Encounter >> Jan 24, 2018 12:08 PM Vernona Rieger wrote: CRM for notification. See Telephone encounter for: 01/24/18.  HYDROcodone-acetaminophen (NORCO/VICODIN) 5-325 MG tablet  [place], [place] - [address]

## 2018-01-24 NOTE — Telephone Encounter (Signed)
Hydrocodone refill Last OV:12/02/17 Last refill:12/23/17 120 tab/0 refill OFV:[id number] Pharmacy: [name] [address] [place] - [address] [phone] (Phone) [phone] (Fax)

## 2018-01-25 MED ORDER — HYDROCODONE-ACETAMINOPHEN 5-325 MG PO TABS: 1.0000 | ORAL_TABLET | Freq: Four times a day (QID) | ORAL | 0 refills | Status: DC | PRN

## 2018-01-25 NOTE — Telephone Encounter (Signed)
See note

## 2018-01-25 NOTE — Telephone Encounter (Signed)
Ok to refill 

## 2018-02-07 ENCOUNTER — Ambulatory Visit: Payer: Medicare Other | Admitting: Family Medicine

## 2018-02-07 NOTE — Progress Notes (Deleted)
Krista Clarke is a 77 y.o. female is here for follow up.  History of Present Illness:   {CMA SCRIBE ATTESTATION}  HPI:   Health Maintenance Due  Topic Date Due  . TETANUS/TDAP  08/30/1958  . PNA vac Low Risk Adult (1 of 2 - PCV13) 08/30/2004   Depression screen PHQ 2/9 04/28/2017 03/25/2017 10/01/2016  Decreased Interest 0 0 0  Down, Depressed, Hopeless 0 0 0  PHQ - 2 Score 0 0 0   PMHx, SurgHx, SocialHx, FamHx, Medications, and Allergies were reviewed in the Visit Navigator and updated as appropriate.   Patient Active Problem List   Diagnosis Date Noted  . LBBB (left bundle branch block) 11/18/2016  . Pacemaker 11/18/2016  . PAD (peripheral artery disease) ([place]) 11/18/2016  . 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 Chronic midline low back pain with bilateral sciatica 10/02/2016  . Paresthesia of both lower extremities 10/02/2016  . Obesity, Class II, BMI 35-39.9, with comorbidity 10/02/2016  . HLD (hyperlipidemia) 10/02/2016  . Hypertension associated with diabetes ([place]) 03/08/2016  . Anemia, iron deficiency 03/08/2016  . Diabetes mellitus type 2 in obese ([place]) 03/08/2016  . Complete heart block ([place]) 08/20/2015  . Angina pectoris ([place]) 08/26/2014   Social History   Tobacco Use  . Smoking status: Never Smoker  . Smokeless tobacco: Never Used  Substance Use Topics  . Alcohol use: No  . Drug use: No   Current Medications and Allergies:   Current Outpatient Medications:  .  acetaminophen (TYLENOL) 500 MG tablet, Take 1,000 mg by mouth every 6 (six) hours as needed. Reported on 08/20/2015, Disp: , Rfl:  .  allopurinol (ZYLOPRIM) 100 MG tablet, TAKE 1 TABLET BY MOUTH EVERY MORNING, Disp: 90 tablet, Rfl: 0 .  amLODipine (NORVASC) 10 MG tablet, , Disp: , Rfl:  .  aspirin 81 MG chewable tablet, Chew by mouth daily., Disp: , Rfl:  .  atorvastatin (LIPITOR) 40 MG tablet, Take 40 mg by mouth daily. , Disp: , Rfl:  .  Calcium Citrate-Vitamin D (CALCIUM CITRATE + D PO),  Take 1 tablet by mouth daily., Disp: , Rfl:  .  clopidogrel (PLAVIX) 75 MG tablet, TAKE 1 TABLET BY MOUTH ONCE DAILY, Disp: 90 tablet, Rfl: 0 .  clopidogrel (PLAVIX) 75 MG tablet, TAKE 1 TABLET BY MOUTH ONCE DAILY, Disp: 90 tablet, Rfl: 1 .  ferrous sulfate 325 (65 FE) MG EC tablet, Take 325 mg by mouth 2 (two) times daily., Disp: , Rfl:  .  furosemide (LASIX) 20 MG tablet, TAKE 1 TABLET BY MOUTH ONCE DAILY, Disp: 20 tablet, Rfl: 0 .  gabapentin (NEURONTIN) 100 MG capsule, TAKE 2 CAPSULES BY MOUTH THREE TIMES DAILY, Disp: 180 capsule, Rfl: 3 .  hydrochlorothiazide (MICROZIDE) 12.5 MG capsule, TAKE 1 CAPSULE BY MOUTH  DAILY, Disp: 90 capsule, Rfl: 1 .  HYDROcodone-acetaminophen (NORCO/VICODIN) 5-325 MG tablet, Take 1 tablet by mouth 4 (four) times daily as needed (Chronic Pain)., Disp: 120 tablet, Rfl: 0 .  KLOR-CON M20 20 MEQ tablet, TAKE 1 TABLET BY MOUTH ONCE DAILY, Disp: 20 tablet, Rfl: 0 .  losartan-hydrochlorothiazide (HYZAAR) 50-12.5 MG tablet, Take 1 tablet by mouth daily., Disp: 90 tablet, Rfl: 3 .  Lutein 20 MG TABS, Take by mouth daily. , Disp: , Rfl:  .  magnesium oxide (MAG-OX) 400 MG tablet, Take 400 mg by mouth daily., Disp: , Rfl:  .  meclizine (ANTIVERT) 25 MG tablet, Take 1 tablet (25 mg total) by mouth 2 (two) times daily.  Take no more than 4 times per week., Disp: 60 tablet, Rfl: 0 .  metFORMIN (GLUCOPHAGE) 1000 MG tablet, Take 1 tablet (1,000 mg total) by mouth 2 (two) times daily with a meal., Disp: 60 tablet, Rfl: 11 .  metoprolol tartrate (LOPRESSOR) 50 MG tablet, , Disp: , Rfl:  .  vitamin C (ASCORBIC ACID) 500 MG tablet, Take 500 mg by mouth daily., Disp: , Rfl:  .  Vorapaxar Sulfate (ZONTIVITY) 2.08 MG TABS, Take 1 tablet by mouth daily., Disp: 30 tablet, Rfl: 0  Allergies  Allergen Reactions  . Prednisone Palpitations and Other (See Comments)    Doesn't work well for patient   Review of Systems   Pertinent items are noted in the HPI. Otherwise, ROS is  negative.  Vitals:  There were no vitals filed for this visit.   There is no height or weight on file to calculate BMI.  Physical Exam:   Physical Exam  Results for orders placed or performed in visit on 10/04/17  Urine Culture  Result Value Ref Range   MICRO NUMBER: [id number]    SPECIMEN QUALITY: ADEQUATE    Sample Source NOT GIVEN    STATUS: FINAL    Result:      Three or more organisms present, each greater than 10,000 cu/mL. May represent normal flora contamination from external genitalia. No further testing is required.  CBC with Differential/Platelet  Result Value Ref Range   WBC 5.8 4.0 - 10.5 K/uL   RBC 4.07 3.87 - 5.11 Mil/uL   Hemoglobin 10.0 (L) 12.0 - 15.0 g/dL   HCT 31.8 (L) 36.0 - 46.0 %   MCV 78.3 78.0 - 100.0 fl   MCHC 31.5 30.0 - 36.0 g/dL   RDW 15.4 11.5 - 15.5 %   Platelets 260.0 150.0 - 400.0 K/uL   Neutrophils Relative % 71.4 43.0 - 77.0 %   Lymphocytes Relative 17.5 12.0 - 46.0 %   Monocytes Relative 8.5 3.0 - 12.0 %   Eosinophils Relative 2.2 0.0 - 5.0 %   Basophils Relative 0.4 0.0 - 3.0 %   Neutro Abs 4.1 1.4 - 7.7 K/uL   Lymphs Abs 1.0 0.7 - 4.0 K/uL   Monocytes Absolute 0.5 0.1 - 1.0 K/uL   Eosinophils Absolute 0.1 0.0 - 0.7 K/uL   Basophils Absolute 0.0 0.0 - 0.1 K/uL  Comprehensive metabolic panel  Result Value Ref Range   Sodium 138 135 - 145 mEq/L   Potassium 4.4 3.5 - 5.1 mEq/L   Chloride 101 96 - 112 mEq/L   CO2 27 19 - 32 mEq/L   Glucose, Bld 161 (H) 70 - 99 mg/dL   BUN 24 (H) 6 - 23 mg/dL   Creatinine, Ser 0.97 0.40 - 1.20 mg/dL   Total Bilirubin 0.4 0.2 - 1.2 mg/dL   Alkaline Phosphatase 74 39 - 117 U/L   AST 11 0 - 37 U/L   ALT 10 0 - 35 U/L   Total Protein 7.5 6.0 - 8.3 g/dL   Albumin 4.1 3.5 - 5.2 g/dL   Calcium 9.5 8.4 - 10.5 mg/dL   GFR 71.41 >60.00 mL/min  Hemoglobin A1c  Result Value Ref Range   Hgb A1c MFr Bld 8.4 (H) 4.6 - 6.5 %  Urinalysis, Routine w reflex microscopic  Result Value Ref Range   Color, Urine  YELLOW Yellow;Lt. Yellow   APPearance Sl Cloudy (A) Clear   Specific Gravity, Urine 1.015 1.000 - 1.030   pH 6.0 5.0 - 8.0  Total Protein, Urine NEGATIVE Negative   Urine Glucose NEGATIVE Negative   Ketones, ur TRACE (A) Negative   Bilirubin Urine NEGATIVE Negative   Hgb urine dipstick NEGATIVE Negative   Urobilinogen, UA 0.2 0.0 - 1.0   Leukocytes, UA SMALL (A) Negative   Nitrite NEGATIVE Negative   WBC, UA 7-10/hpf (A) 0-2/hpf   RBC / HPF none seen 0-2/hpf   Mucus, UA Presence of (A) None   Squamous Epithelial / LPF Few(5-10/hpf) (A) Rare(0-4/hpf)   Bacteria, UA Few(10-50/hpf) (A) None   Yeast, UA Presence of (A) None  B12  Result Value Ref Range   Vitamin B-12 500 211 - 911 pg/mL    Assessment and Plan:   There are no diagnoses linked to this encounter.  . Reviewed expectations re: course of current medical issues. . Discussed self-management of symptoms. . Outlined signs and symptoms indicating need for more acute intervention. . Patient verbalized understanding and all questions were answered. Marland Kitchen Health Maintenance issues including appropriate healthy diet, exercise, and smoking avoidance were discussed with patient. . See orders for this visit as documented in the electronic medical record. . Patient received an After Visit Summary.  *** CMA served as Education administrator during this visit. History, Physical, and Plan performed by medical provider. The above documentation has been reviewed and is accurate and complete. Briscoe Deutscher, D.O.  Briscoe Deutscher, DO [place], [facility] 02/07/2018

## 2018-02-08 ENCOUNTER — Ambulatory Visit (INDEPENDENT_AMBULATORY_CARE_PROVIDER_SITE_OTHER): Payer: Medicare Other | Admitting: Family Medicine

## 2018-02-08 ENCOUNTER — Encounter: Payer: Self-pay | Admitting: Family Medicine

## 2018-02-08 VITALS — BP 140/70 | HR 71 | Temp 97.9°F | Ht 63.0 in | Wt 220.6 lb

## 2018-02-08 DIAGNOSIS — R600 Localized edema: Secondary | ICD-10-CM

## 2018-02-08 DIAGNOSIS — E1159 Type 2 diabetes mellitus with other circulatory complications: Secondary | ICD-10-CM

## 2018-02-08 DIAGNOSIS — I10 Essential (primary) hypertension: Secondary | ICD-10-CM

## 2018-02-08 DIAGNOSIS — R32 Unspecified urinary incontinence: Secondary | ICD-10-CM | POA: Diagnosis not present

## 2018-02-08 LAB — URINALYSIS, ROUTINE W REFLEX MICROSCOPIC
Bilirubin Urine: NEGATIVE
Hgb urine dipstick: NEGATIVE
Ketones, ur: NEGATIVE
Nitrite: NEGATIVE
RBC / HPF: NONE SEEN (ref 0–?)
Specific Gravity, Urine: 1.015 (ref 1.000–1.030)
Total Protein, Urine: NEGATIVE
Urine Glucose: NEGATIVE
Urobilinogen, UA: 0.2 (ref 0.0–1.0)
pH: 6 (ref 5.0–8.0)

## 2018-02-08 LAB — CBC WITH DIFFERENTIAL/PLATELET
Basophils Absolute: 0 10*3/uL (ref 0.0–0.1)
Basophils Relative: 0.5 % (ref 0.0–3.0)
Eosinophils Absolute: 0.2 10*3/uL (ref 0.0–0.7)
Eosinophils Relative: 2.7 % (ref 0.0–5.0)
HCT: 33.4 % — ABNORMAL LOW (ref 36.0–46.0)
Hemoglobin: 10.7 g/dL — ABNORMAL LOW (ref 12.0–15.0)
Lymphocytes Relative: 24.1 % (ref 12.0–46.0)
Lymphs Abs: 1.5 10*3/uL (ref 0.7–4.0)
MCHC: 32 g/dL (ref 30.0–36.0)
MCV: 76.8 fl — ABNORMAL LOW (ref 78.0–100.0)
Monocytes Absolute: 0.5 10*3/uL (ref 0.1–1.0)
Monocytes Relative: 7.6 % (ref 3.0–12.0)
Neutro Abs: 4 10*3/uL (ref 1.4–7.7)
Neutrophils Relative %: 65.1 % (ref 43.0–77.0)
Platelets: 290 10*3/uL (ref 150.0–400.0)
RBC: 4.35 Mil/uL (ref 3.87–5.11)
RDW: 15.4 % (ref 11.5–15.5)
WBC: 6.1 10*3/uL (ref 4.0–10.5)

## 2018-02-08 LAB — COMPREHENSIVE METABOLIC PANEL
ALT: 13 U/L (ref 0–35)
AST: 14 U/L (ref 0–37)
Albumin: 4.5 g/dL (ref 3.5–5.2)
Alkaline Phosphatase: 65 U/L (ref 39–117)
BUN: 29 mg/dL — ABNORMAL HIGH (ref 6–23)
CO2: 29 mEq/L (ref 19–32)
Calcium: 10.7 mg/dL — ABNORMAL HIGH (ref 8.4–10.5)
Chloride: 99 mEq/L (ref 96–112)
Creatinine, Ser: 0.98 mg/dL (ref 0.40–1.20)
GFR: 70.51 mL/min (ref >60.00)
Glucose, Bld: 142 mg/dL — ABNORMAL HIGH (ref 70–99)
Potassium: 4.2 mEq/L (ref 3.5–5.1)
Sodium: 137 mEq/L (ref 135–145)
Total Bilirubin: 0.4 mg/dL (ref 0.2–1.2)
Total Protein: 8.2 g/dL (ref 6.0–8.3)

## 2018-02-08 LAB — MAGNESIUM: Magnesium: 1.6 mg/dL (ref 1.5–2.5)

## 2018-02-08 LAB — PHOSPHORUS: Phosphorus: 4.7 mg/dL — ABNORMAL HIGH (ref 2.3–4.6)

## 2018-02-08 MED ORDER — GABAPENTIN 100 MG PO CAPS: ORAL_CAPSULE | ORAL | 3 refills | Status: DC

## 2018-02-08 MED ORDER — FUROSEMIDE 20 MG PO TABS: 20.0000 mg | ORAL_TABLET | Freq: Every day | ORAL | 0 refills | Status: DC

## 2018-02-08 MED ORDER — POTASSIUM CHLORIDE CRYS ER 20 MEQ PO TBCR: 20.0000 meq | EXTENDED_RELEASE_TABLET | Freq: Every day | ORAL | 0 refills | Status: DC

## 2018-02-08 NOTE — Progress Notes (Signed)
Krista Clarke is a 77 y.o. female is here for follow up.  History of Present Illness:   Krista Clarke, CMA acting as scribe for Dr. Briscoe Deutscher.   HPI: Patient in office for follow up. She has had increased bilateral feet and leg swelling with pain. She has been out of the lasix and potassium for a while. Her weight has gone up three pounds from last visit.   On Myrbetriq 50 mg po daily. Feels that it helps a little. Next Urology appointment 6/28.   Still having paresthesias. Wondering if she should increase Neurontin.   MMSE completed today. See scanned.   Health Maintenance Due  Topic Date Due  . TETANUS/TDAP  08/30/1958  . PNA vac Low Risk Adult (1 of 2 - PCV13) 08/30/2004   Depression screen PHQ 2/9 04/28/2017 03/25/2017 10/01/2016  Decreased Interest 0 0 0  Down, Depressed, Hopeless 0 0 0  PHQ - 2 Score 0 0 0   PMHx, SurgHx, SocialHx, FamHx, Medications, and Allergies were reviewed in the Visit Navigator and updated as appropriate.   Patient Active Problem List   Diagnosis Date Noted  . LBBB (left bundle branch block) 11/18/2016  . Pacemaker 11/18/2016  . PAD (peripheral artery disease) ([place]) 11/18/2016  . 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 Chronic midline low back pain with bilateral sciatica 10/02/2016  . Paresthesia of both lower extremities 10/02/2016  . Obesity, Class II, BMI 35-39.9, with comorbidity 10/02/2016  . HLD (hyperlipidemia) 10/02/2016  . Hypertension associated with diabetes ([place]) 03/08/2016  . Anemia, iron deficiency 03/08/2016  . Diabetes mellitus type 2 in obese ([place]) 03/08/2016  . Complete heart block ([place]) 08/20/2015  . Angina pectoris ([place]) 08/26/2014   Social History   Tobacco Use  . Smoking status: Never Smoker  . Smokeless tobacco: Never Used  Substance Use Topics  . Alcohol use: No  . Drug use: No   Current Medications and Allergies:   Current Outpatient Medications:  .  acetaminophen (TYLENOL) 500 MG tablet, Take  1,000 mg by mouth every 6 (six) hours as needed. Reported on 08/20/2015, Disp: , Rfl:  .  allopurinol (ZYLOPRIM) 100 MG tablet, TAKE 1 TABLET BY MOUTH EVERY MORNING, Disp: 90 tablet, Rfl: 0 .  amLODipine (NORVASC) 10 MG tablet, , Disp: , Rfl:  .  aspirin 81 MG chewable tablet, Chew by mouth daily., Disp: , Rfl:  .  atorvastatin (LIPITOR) 40 MG tablet, Take 40 mg by mouth daily. , Disp: , Rfl:  .  Calcium Citrate-Vitamin D (CALCIUM CITRATE + D PO), Take 1 tablet by mouth daily., Disp: , Rfl:  .  clopidogrel (PLAVIX) 75 MG tablet, TAKE 1 TABLET BY MOUTH ONCE DAILY, Disp: 90 tablet, Rfl: 0 .  clopidogrel (PLAVIX) 75 MG tablet, TAKE 1 TABLET BY MOUTH ONCE DAILY, Disp: 90 tablet, Rfl: 1 .  ferrous sulfate 325 (65 FE) MG EC tablet, Take 325 mg by mouth 2 (two) times daily., Disp: , Rfl:  .  furosemide (LASIX) 20 MG tablet, TAKE 1 TABLET BY MOUTH ONCE DAILY, Disp: 20 tablet, Rfl: 0 .  gabapentin (NEURONTIN) 100 MG capsule, TAKE 2 CAPSULES BY MOUTH THREE TIMES DAILY, Disp: 180 capsule, Rfl: 3 .  hydrochlorothiazide (MICROZIDE) 12.5 MG capsule, TAKE 1 CAPSULE BY MOUTH  DAILY, Disp: 90 capsule, Rfl: 1 .  HYDROcodone-acetaminophen (NORCO/VICODIN) 5-325 MG tablet, Take 1 tablet by mouth 4 (four) times daily as needed (Chronic Pain)., Disp: 120 tablet, Rfl: 0 .  KLOR-CON M20 20 MEQ  tablet, TAKE 1 TABLET BY MOUTH ONCE DAILY, Disp: 20 tablet, Rfl: 0 .  losartan-hydrochlorothiazide (HYZAAR) 50-12.5 MG tablet, Take 1 tablet by mouth daily., Disp: 90 tablet, Rfl: 3 .  Lutein 20 MG TABS, Take by mouth daily. , Disp: , Rfl:  .  magnesium oxide (MAG-OX) 400 MG tablet, Take 400 mg by mouth daily., Disp: , Rfl:  .  meclizine (ANTIVERT) 25 MG tablet, Take 1 tablet (25 mg total) by mouth 2 (two) times daily. Take no more than 4 times per week., Disp: 60 tablet, Rfl: 0 .  metFORMIN (GLUCOPHAGE) 1000 MG tablet, Take 1 tablet (1,000 mg total) by mouth 2 (two) times daily with a meal., Disp: 60 tablet, Rfl: 11 .  metoprolol  tartrate (LOPRESSOR) 50 MG tablet, , Disp: , Rfl:  .  vitamin C (ASCORBIC ACID) 500 MG tablet, Take 500 mg by mouth daily., Disp: , Rfl:  .  Vorapaxar Sulfate (ZONTIVITY) 2.08 MG TABS, Take 1 tablet by mouth daily., Disp: 30 tablet, Rfl: 0   Allergies  Allergen Reactions  . Prednisone Palpitations and Other (See Comments)    Doesn't work well for patient   Review of Systems   Pertinent items are noted in the HPI. Otherwise, ROS is negative.  Vitals:   Vitals:   02/08/18 1320  BP: 140/70  Pulse: 71  Temp: 97.9 F (36.6 C)  TempSrc: Oral  SpO2: 97%  Weight: 220 lb 9.6 oz (100.1 kg)  Height: 5\' 3"  (1.6 m)     Body mass index is 39.08 kg/m.  Physical Exam:   Physical Exam  Constitutional: She is oriented to person, place, and time. She appears well-developed and well-nourished. No distress.  HENT:  Head: Normocephalic and atraumatic.  Eyes: Pupils are equal, round, and reactive to light. Conjunctivae and EOM are normal.  Neck: Normal range of motion. Neck supple. No thyromegaly present.  Cardiovascular: Normal rate, regular rhythm and intact distal pulses.  Pulmonary/Chest: Effort normal and breath sounds normal.  Abdominal: Soft.  Musculoskeletal:  Nonpitting edema only to bilateral feet.  Otherwise at baseline and not concerning.  Neurological: She is alert and oriented to person, place, and time.  Skin: Skin is warm.  Psychiatric: She has a normal mood and affect. Her behavior is normal.  Nursing note and vitals reviewed.  Assessment and Plan:   Dominik was seen today for follow-up.  Diagnoses and all orders for this visit:  Hypertension associated with diabetes ([place]) -     CBC with Differential/Platelet -     Comprehensive metabolic panel -     Magnesium -     Phosphorus  Localized edema -     furosemide (LASIX) 20 MG tablet; Take 1 tablet (20 mg total) by mouth daily. -     potassium chloride SA (KLOR-CON M20) 20 MEQ tablet; Take 1 tablet (20 mEq total) by  mouth daily.  Bilateral lower extremity edema Comments: Mild to moderate. Intermittent. Made better with leg elevation.  Orders: -     furosemide (LASIX) 20 MG tablet; Take 1 tablet (20 mg total) by mouth daily. -     potassium chloride SA (KLOR-CON M20) 20 MEQ tablet; Take 1 tablet (20 mEq total) by mouth daily. -     CBC with Differential/Platelet -     Comprehensive metabolic panel -     Magnesium -     Phosphorus -     gabapentin (NEURONTIN) 100 MG capsule; Take two tablets at bedtime.  Urinary incontinence, unspecified  type -     Urinalysis, Routine w reflex microscopic -     Urine Culture    . Reviewed expectations re: course of current medical issues. . Discussed self-management of symptoms. . Outlined signs and symptoms indicating need for more acute intervention. . Patient verbalized understanding and all questions were answered. Marland Kitchen Health Maintenance issues including appropriate healthy diet, exercise, and smoking avoidance were discussed with patient. . See orders for this visit as documented in the electronic medical record. . Patient received an After Visit Summary.  CMA served as Education administrator during this visit. History, Physical, and Plan performed by medical provider. The above documentation has been reviewed and is accurate and complete. Briscoe Deutscher, D.O.  Briscoe Deutscher, DO [place], [facility] 02/14/2018

## 2018-02-09 LAB — URINE CULTURE
MICRO NUMBER:: 90705244
SPECIMEN QUALITY:: ADEQUATE

## 2018-02-14 ENCOUNTER — Ambulatory Visit (INDEPENDENT_AMBULATORY_CARE_PROVIDER_SITE_OTHER): Payer: Medicare Other | Admitting: Orthotics

## 2018-02-14 DIAGNOSIS — M79675 Pain in left toe(s): Secondary | ICD-10-CM

## 2018-02-14 DIAGNOSIS — Z45018 Encounter for adjustment and management of other part of cardiac pacemaker: Secondary | ICD-10-CM | POA: Diagnosis not present

## 2018-02-14 DIAGNOSIS — E1159 Type 2 diabetes mellitus with other circulatory complications: Secondary | ICD-10-CM

## 2018-02-14 DIAGNOSIS — B351 Tinea unguium: Secondary | ICD-10-CM

## 2018-02-14 DIAGNOSIS — M79674 Pain in right toe(s): Secondary | ICD-10-CM

## 2018-02-14 DIAGNOSIS — E1142 Type 2 diabetes mellitus with diabetic polyneuropathy: Secondary | ICD-10-CM | POA: Diagnosis not present

## 2018-02-14 DIAGNOSIS — Z950 Presence of cardiac pacemaker: Secondary | ICD-10-CM | POA: Diagnosis not present

## 2018-02-14 DIAGNOSIS — M2011 Hallux valgus (acquired), right foot: Secondary | ICD-10-CM | POA: Diagnosis not present

## 2018-02-14 NOTE — Progress Notes (Signed)
Patient presents for diabetic shoe pick up, shoes are tried on for good fit.  Patient received 1 Pair and 3 pairs custom molded diabetic inserts.  Verbal and written break in and wear instructions given.  Patient will follow up for scheduled routine care.   

## 2018-02-24 ENCOUNTER — Other Ambulatory Visit: Payer: Self-pay | Admitting: Family Medicine

## 2018-02-24 DIAGNOSIS — R350 Frequency of micturition: Secondary | ICD-10-CM | POA: Diagnosis not present

## 2018-02-24 NOTE — Telephone Encounter (Signed)
See note

## 2018-02-24 NOTE — Telephone Encounter (Signed)
Hydrocodone refill Last Refill:01/25/18 #120 Last OV: 02/08/18 PCP: Dr. Juleen China [facility]acy:[facility]   [address]

## 2018-02-24 NOTE — Telephone Encounter (Signed)
Copied from [place] [id number]. Topic: Quick Communication - Rx Refill/Question >> Feb 24, 2018 11:59 AM Boyd Kerbs wrote: Medication: HYDROcodone-acetaminophen (NORCO/VICODIN) 5-325 MG tablet  Has the patient contacted their pharmacy? No. (Agent: If no, request that the patient contact the pharmacy for the refill.) (Agent: If yes, when and what did the pharmacy advise?)  Preferred Pharmacy (with phone number or street name):  [place], [place] - [address] [place] [address] Phone: [phone] Fax: [phone]    Agent: Please be advised that RX refills may take up to 3 business days. We ask that you follow-up with your pharmacy.

## 2018-02-27 ENCOUNTER — Other Ambulatory Visit: Payer: Self-pay | Admitting: Family Medicine

## 2018-02-27 MED ORDER — HYDROCODONE-ACETAMINOPHEN 5-325 MG PO TABS: 1.0000 | ORAL_TABLET | Freq: Four times a day (QID) | ORAL | 0 refills | Status: DC | PRN

## 2018-03-02 ENCOUNTER — Other Ambulatory Visit: Payer: Self-pay | Admitting: Family Medicine

## 2018-03-20 ENCOUNTER — Telehealth: Payer: Self-pay | Admitting: Family Medicine

## 2018-03-20 NOTE — Telephone Encounter (Signed)
Copied from [place] [id number]. Topic: Quick Communication - Rx Refill/Question >> Mar 20, 2018  9:58 AM Oliver Pila B wrote: Medication: clopidogrel (PLAVIX) 75 MG tablet [[id number]]   Pt is going to have her teeth pulled by the dentist and is asking when is a good time to stop taking the medication and then to restart; pt can schedule dentist appt at anytime, contact to advise

## 2018-03-21 NOTE — Telephone Encounter (Signed)
I spoke with the patients daughter and she stated that the dentist is wanting patient to go off of the plavix. Daughter stated that she is still seeing cardiology.

## 2018-03-21 NOTE — Telephone Encounter (Signed)
Patient states she needs a new partial plate- she is getting teeth pulled on top and bottom- about 6-7 teeth. Patient wants to know when to stop her Plavix and when to restart it. Please advise her.

## 2018-03-21 NOTE — Telephone Encounter (Signed)
See note

## 2018-03-21 NOTE — Telephone Encounter (Signed)
Please advise on when the patient to stop medication and restart.

## 2018-03-21 NOTE — Telephone Encounter (Signed)
Did dentist say to hold? Still going to Cardiology?

## 2018-03-22 DIAGNOSIS — I10 Essential (primary) hypertension: Secondary | ICD-10-CM | POA: Diagnosis not present

## 2018-03-22 DIAGNOSIS — E119 Type 2 diabetes mellitus without complications: Secondary | ICD-10-CM | POA: Diagnosis not present

## 2018-03-22 NOTE — Telephone Encounter (Signed)
Ask her to contact her cardiologist about this issue. Also make sure that we get a record from him. I need to know indication for Plavix.

## 2018-03-22 NOTE — Telephone Encounter (Signed)
Spoke with patients daughter and she is going to call the cardiology office. She will have them send [place] records.

## 2018-03-27 ENCOUNTER — Other Ambulatory Visit: Payer: Self-pay | Admitting: Family Medicine

## 2018-03-27 NOTE — Telephone Encounter (Signed)
Copied from [place] [phone]. Topic: Quick Communication - Rx Refill/Question >> Mar 27, 2018  4:12 PM Bea Graff, NT wrote: Medication: HYDROcodone-acetaminophen (NORCO/VICODIN) 5-325 MG tablet  Has the patient contacted their pharmacy? Yes.   (Agent: If no, request that the patient contact the pharmacy for the refill.) (Agent: If yes, when and what did the pharmacy advise?)  Preferred Pharmacy (with phone number or street name): [place], [place] - [address] [phone] (Phone) [phone] (Fax)      Agent: Please be advised that RX refills may take up to 3 business days. We ask that you follow-up with your pharmacy.

## 2018-03-28 MED ORDER — HYDROCODONE-ACETAMINOPHEN 5-325 MG PO TABS: 1.0000 | ORAL_TABLET | Freq: Four times a day (QID) | ORAL | 0 refills | Status: DC | PRN

## 2018-03-28 NOTE — Telephone Encounter (Signed)
Hydrodocone refill Last Refill:02/27/18 #120 Last OV: 02/08/18 PCP: Dr. Juleen China [facility]acy[facility] [address]

## 2018-03-28 NOTE — Telephone Encounter (Signed)
Ok to fill 

## 2018-03-28 NOTE — Telephone Encounter (Signed)
Ok to refill 

## 2018-03-28 NOTE — Telephone Encounter (Signed)
See note

## 2018-04-04 ENCOUNTER — Other Ambulatory Visit: Payer: Self-pay | Admitting: Family Medicine

## 2018-04-04 DIAGNOSIS — R600 Localized edema: Secondary | ICD-10-CM

## 2018-04-07 ENCOUNTER — Ambulatory Visit: Payer: Medicare Other | Admitting: Podiatry

## 2018-04-26 ENCOUNTER — Other Ambulatory Visit: Payer: Self-pay | Admitting: Family Medicine

## 2018-04-26 DIAGNOSIS — R600 Localized edema: Secondary | ICD-10-CM

## 2018-05-02 ENCOUNTER — Other Ambulatory Visit: Payer: Self-pay | Admitting: Family Medicine

## 2018-05-02 NOTE — Telephone Encounter (Signed)
Copied from [place] [phone]. Topic: Quick Communication - Rx Refill/Question >> May 02, 2018  3:40 PM Wynetta Emery, [place] C wrote: Medication: Hydrodocone   Has the patient contacted their pharmacy? No  (Agent: If no, request that the patient contact the pharmacy for the refill.) (Agent: If yes, when and what did the pharmacy advise?)  Preferred Pharmacy (with phone number or street name): [place], [place] - [address] [phone] (Phone) [phone] (Fax)    Agent: Please be advised that RX refills may take up to 3 business days. We ask that you follow-up with your pharmacy.

## 2018-05-02 NOTE — Telephone Encounter (Signed)
See note

## 2018-05-02 NOTE — Telephone Encounter (Signed)
Please advise on refill.

## 2018-05-02 NOTE — Telephone Encounter (Signed)
Refill of hydrocodone  LRF 03/28/18  #120  0 refills  LOV 02/08/18 [facility] [address] [place] - [address]      [phone] (Phone) [phone] (Fax)

## 2018-05-03 ENCOUNTER — Ambulatory Visit: Payer: Medicare Other | Admitting: Podiatry

## 2018-05-03 ENCOUNTER — Encounter: Payer: Self-pay | Admitting: Podiatry

## 2018-05-03 DIAGNOSIS — B351 Tinea unguium: Secondary | ICD-10-CM | POA: Diagnosis not present

## 2018-05-03 DIAGNOSIS — D689 Coagulation defect, unspecified: Secondary | ICD-10-CM

## 2018-05-03 DIAGNOSIS — E1142 Type 2 diabetes mellitus with diabetic polyneuropathy: Secondary | ICD-10-CM | POA: Diagnosis not present

## 2018-05-03 DIAGNOSIS — M79674 Pain in right toe(s): Secondary | ICD-10-CM

## 2018-05-03 DIAGNOSIS — M79675 Pain in left toe(s): Secondary | ICD-10-CM | POA: Diagnosis not present

## 2018-05-03 MED ORDER — AMMONIUM LACTATE 12 % EX CREA: TOPICAL_CREAM | CUTANEOUS | 0 refills | Status: DC | PRN

## 2018-05-03 NOTE — Progress Notes (Signed)
Complaint:  Visit Type: Patient returns to my office for continued preventative foot care services. Complaint: Patient states" my nails have grown long and thick and become painful to walk and wear shoes" Patient has been diagnosed with DM with neuropathy.. The patient presents for preventative foot care services. No changes to ROS  Podiatric Exam: Vascular: dorsalis pedis and posterior tibial pulses are palpable bilateral. Capillary return is immediate. Temperature gradient is WNL. Skin turgor WNL  Sensorium: Absent  Semmes Weinstein monofilament test. Normal tactile sensation bilaterally. Nail Exam: Pt has thick disfigured discolored nails with subungual debris noted bilateral entire nail hallux through fifth toenails.  Right hallux nail has subungual hematoma. Ulcer Exam: There is no evidence of ulcer or pre-ulcerative changes or infection. Orthopedic Exam: Muscle tone and strength are WNL. No limitations in general ROM. No crepitus or effusions noted. Foot type and digits show no abnormalities. Bony prominences are unremarkable. Skin: No Porokeratosis. No infection or ulcers  Diagnosis:  Onychomycosis, , Pain in right toe, pain in left toes  Treatment & Plan Procedures and Treatment: Consent by patient was obtained for treatment procedures.   Debridement of mycotic and hypertrophic toenails, 1 through 5 bilateral and clearing of subungual debris. No ulceration, no infection noted.  Return Visit-Office Procedure: Patient instructed to return to the office for a follow up visit 3 months for continued evaluation and treatment.    Gardiner Barefoot DPM

## 2018-05-03 NOTE — Addendum Note (Signed)
Addended byDeidre Ala, Evertt Chouinard L on: 05/03/2018 10:00 AM   Modules accepted: Orders

## 2018-05-04 MED ORDER — HYDROCODONE-ACETAMINOPHEN 5-325 MG PO TABS: 1.0000 | ORAL_TABLET | Freq: Four times a day (QID) | ORAL | 0 refills | Status: DC | PRN

## 2018-05-12 ENCOUNTER — Ambulatory Visit (INDEPENDENT_AMBULATORY_CARE_PROVIDER_SITE_OTHER): Payer: Medicare Other | Admitting: Family Medicine

## 2018-05-12 ENCOUNTER — Encounter: Payer: Self-pay | Admitting: Family Medicine

## 2018-05-12 VITALS — BP 137/68 | HR 82 | Temp 98.7°F | Ht 63.0 in | Wt 215.6 lb

## 2018-05-12 DIAGNOSIS — R202 Paresthesia of skin: Secondary | ICD-10-CM

## 2018-05-12 DIAGNOSIS — R42 Dizziness and giddiness: Secondary | ICD-10-CM | POA: Diagnosis not present

## 2018-05-12 DIAGNOSIS — M5441 Lumbago with sciatica, right side: Secondary | ICD-10-CM

## 2018-05-12 DIAGNOSIS — E1159 Type 2 diabetes mellitus with other circulatory complications: Secondary | ICD-10-CM

## 2018-05-12 DIAGNOSIS — E669 Obesity, unspecified: Secondary | ICD-10-CM

## 2018-05-12 DIAGNOSIS — E1169 Type 2 diabetes mellitus with other specified complication: Secondary | ICD-10-CM

## 2018-05-12 DIAGNOSIS — E785 Hyperlipidemia, unspecified: Secondary | ICD-10-CM

## 2018-05-12 DIAGNOSIS — I152 Hypertension secondary to endocrine disorders: Secondary | ICD-10-CM

## 2018-05-12 DIAGNOSIS — M5442 Lumbago with sciatica, left side: Secondary | ICD-10-CM

## 2018-05-12 DIAGNOSIS — G8929 Other chronic pain: Secondary | ICD-10-CM

## 2018-05-12 DIAGNOSIS — Z79899 Other long term (current) drug therapy: Secondary | ICD-10-CM | POA: Diagnosis not present

## 2018-05-12 DIAGNOSIS — Z23 Encounter for immunization: Secondary | ICD-10-CM

## 2018-05-12 DIAGNOSIS — I10 Essential (primary) hypertension: Secondary | ICD-10-CM

## 2018-05-12 DIAGNOSIS — D509 Iron deficiency anemia, unspecified: Secondary | ICD-10-CM | POA: Diagnosis not present

## 2018-05-12 LAB — CBC WITH DIFFERENTIAL/PLATELET
Basophils Absolute: 0 10*3/uL (ref 0.0–0.1)
Basophils Relative: 0.3 % (ref 0.0–3.0)
Eosinophils Absolute: 0.2 10*3/uL (ref 0.0–0.7)
Eosinophils Relative: 2.6 % (ref 0.0–5.0)
HCT: 34 % — ABNORMAL LOW (ref 36.0–46.0)
Hemoglobin: 10.9 g/dL — ABNORMAL LOW (ref 12.0–15.0)
Lymphocytes Relative: 19.5 % (ref 12.0–46.0)
Lymphs Abs: 1.3 10*3/uL (ref 0.7–4.0)
MCHC: 32.1 g/dL (ref 30.0–36.0)
MCV: 76.6 fl — ABNORMAL LOW (ref 78.0–100.0)
Monocytes Absolute: 0.5 10*3/uL (ref 0.1–1.0)
Monocytes Relative: 8.4 % (ref 3.0–12.0)
Neutro Abs: 4.5 10*3/uL (ref 1.4–7.7)
Neutrophils Relative %: 69.2 % (ref 43.0–77.0)
Platelets: 290 10*3/uL (ref 150.0–400.0)
RBC: 4.44 Mil/uL (ref 3.87–5.11)
RDW: 15.1 % (ref 11.5–15.5)
WBC: 6.5 10*3/uL (ref 4.0–10.5)

## 2018-05-12 LAB — HEMOGLOBIN A1C: Hgb A1c MFr Bld: 7.8 % — ABNORMAL HIGH (ref 4.6–6.5)

## 2018-05-12 MED ORDER — LOSARTAN POTASSIUM 50 MG PO TABS: 50.0000 mg | ORAL_TABLET | Freq: Every day | ORAL | 3 refills | Status: DC

## 2018-05-12 MED ORDER — POTASSIUM CHLORIDE ER 10 MEQ PO TBCR: EXTENDED_RELEASE_TABLET | ORAL | 1 refills | Status: DC

## 2018-05-12 MED ORDER — TETANUS-DIPHTH-ACELL PERTUSSIS 5-2.5-18.5 LF-MCG/0.5 IM SUSP: 0.5000 mL | Freq: Once | INTRAMUSCULAR | 0 refills | Status: AC

## 2018-05-12 NOTE — Patient Instructions (Addendum)
Check weight daily if you gain three or more pounds a day take an extra lasix and potassium.   We have stopped the Hydrochlorothiazide and changed the Hyzaar to Losartan.   We will send referral for you to

## 2018-05-12 NOTE — Progress Notes (Signed)
Krista Clarke is a 77 y.o. female is here for follow up.  History of Present Illness:   Krista Clarke, CMA acting as scribe for Dr. Briscoe Deutscher.   HPI: Patient in office for follow up.   Vertigo: has has had increased issue with vertigo for the last  week. She denies any vision changes. She feels like the room has been spinning. No sickness, no ringing in her ears, no changes in gait. She denies any injury. She had a prescription for meclizine but it was expired so she got some dramamine over the counter that she has been taking with no problems. All reasons for vertigo were explained to patient including the epley maneuver.   Medication management: All medications reviewed with patient. She has had some constipation with pain medications. It is helped with Murelax. She has not had any muscle aches. She will hold the Hydrochlorothiazide and change the Hyzaar to just losartan 50mg .   Health Maintenance Due  Topic Date Due  . TETANUS/TDAP  08/30/1958   Depression screen [facility] 2/9 05/12/2018 04/28/2017 03/25/2017  Decreased Interest 1 0 0  Down, Depressed, Hopeless 1 0 0  PHQ - 2 Score 2 0 0  Altered sleeping 1 - -  Tired, decreased energy 1 - -  Change in appetite 1 - -  Feeling bad or failure about yourself  1 - -  Trouble concentrating 1 - -  Moving slowly or fidgety/restless 1 - -  Suicidal thoughts 0 - -  PHQ-9 Score 8 - -  Difficult doing work/chores Somewhat difficult - -   PMHx, SurgHx, SocialHx, FamHx, Medications, and Allergies were reviewed in the Visit Navigator and updated as appropriate.   Patient Active Problem List   Diagnosis Date Noted  . LBBB (left bundle branch block) 11/18/2016  . Pacemaker 11/18/2016  . PAD (peripheral artery disease) ([place]) 11/18/2016  . 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 Chronic midline low back pain with bilateral sciatica 10/02/2016  . Paresthesia of both lower extremities 10/02/2016  . Obesity, Class II, BMI 35-39.9, with  comorbidity 10/02/2016  . HLD (hyperlipidemia) 10/02/2016  . Hypertension associated with diabetes ([place]) 03/08/2016  . Anemia, iron deficiency 03/08/2016  . Diabetes mellitus type 2 in obese ([place]) 03/08/2016  . Complete heart block ([place]) 08/20/2015  . Angina pectoris ([place]) 08/26/2014   Social History   Tobacco Use  . Smoking status: Never Smoker  . Smokeless tobacco: Never Used  Substance Use Topics  . Alcohol use: No  . Drug use: No   Current Medications and Allergies:   .  acetaminophen (TYLENOL) 500 MG tablet, Take 1,000 mg by mouth every 6 (six) hours as needed. Reported on 08/20/2015, Disp: , Rfl:  .  allopurinol (ZYLOPRIM) 100 MG tablet, TAKE 1 TABLET BY MOUTH EVERY MORNING, Disp: 90 tablet, Rfl: 0 .  amLODipine (NORVASC) 10 MG tablet, , Disp: , Rfl:  .  ammonium lactate (LAC-HYDRIN) 12 % cream, Apply topically as needed for dry skin., Disp: 385 g, Rfl: 0 .  aspirin 81 MG chewable tablet, Chew by mouth daily., Disp: , Rfl:  .  atorvastatin (LIPITOR) 40 MG tablet, Take 40 mg by mouth daily. , Disp: , Rfl:  .  Calcium Citrate-Vitamin D (CALCIUM CITRATE + D PO), Take 1 tablet by mouth daily., Disp: , Rfl:  .  clopidogrel (PLAVIX) 75 MG tablet, TAKE 1 TABLET BY MOUTH ONCE DAILY, Disp: 90 tablet, Rfl: 0 .  ferrous sulfate 325 (65 FE) MG EC  tablet, Take 325 mg by mouth 2 (two) times daily., Disp: , Rfl:  .  furosemide (LASIX) 20 MG tablet, TAKE 1 TABLET BY MOUTH ONCE DAILY, Disp: 20 tablet, Rfl: 0 .  gabapentin (NEURONTIN) 100 MG capsule, TAKE 2 CAPSULES BY MOUTH THREE TIMES DAILY, Disp: 180 capsule, Rfl: 3 .  gabapentin (NEURONTIN) 100 MG capsule, Take two tablets at bedtime., Disp: 60 capsule, Rfl: 3 .  hydrochlorothiazide (MICROZIDE) 12.5 MG capsule, TAKE 1 CAPSULE BY MOUTH ONCE DAILY, Disp: 90 capsule, Rfl: 1 .  HYDROcodone-acetaminophen (NORCO/VICODIN) 5-325 MG tablet, Take 1 tablet by mouth 4 (four) times daily as needed (Chronic Pain)., Disp: 120 tablet, Rfl: 0 .   losartan-hydrochlorothiazide (HYZAAR) 50-12.5 MG tablet, Take 1 tablet by mouth daily., Disp: 90 tablet, Rfl: 3 .  Lutein 20 MG TABS, Take by mouth daily. , Disp: , Rfl:  .  magnesium oxide (MAG-OX) 400 MG tablet, Take 400 mg by mouth daily., Disp: , Rfl:  .  meclizine (ANTIVERT) 25 MG tablet, Take 1 tablet (25 mg total) by mouth 2 (two) times daily. Take no more than 4 times per week., Disp: 60 tablet, Rfl: 0 .  metFORMIN (GLUCOPHAGE) 1000 MG tablet, Take 1 tablet (1,000 mg total) by mouth 2 (two) times daily with a meal., Disp: 60 tablet, Rfl: 11 .  metoprolol tartrate (LOPRESSOR) 50 MG tablet, , Disp: , Rfl:  .  mirabegron ER (MYRBETRIQ) 50 MG TB24 tablet, Take 50 mg by mouth daily., Disp: , Rfl:  .  potassium chloride SA (K-DUR,KLOR-CON) 20 MEQ tablet, TAKE 1 TABLET BY MOUTH ONCE DAILY, Disp: 20 tablet, Rfl: 0 .  vitamin C (ASCORBIC ACID) 500 MG tablet, Take 500 mg by mouth daily., Disp: , Rfl:    Allergies  Allergen Reactions  . Prednisone Palpitations and Other (See Comments)    Doesn't work well for patient   Review of Systems   Pertinent items are noted in the HPI. Otherwise, ROS is negative.  Vitals:   Vitals:   05/12/18 1353  BP: 137/68  Pulse: 82  Temp: 98.7 F (37.1 C)  TempSrc: Oral  SpO2: 97%  Weight: 215 lb 9.6 oz (97.8 kg)  Height: 5\' 3"  (1.6 m)     Body mass index is 38.19 kg/m.  Physical Exam:   Physical Exam  Constitutional: She is oriented to person, place, and time. She appears well-developed and well-nourished. No distress.  HENT:  Head: Normocephalic and atraumatic.  Eyes: Pupils are equal, round, and reactive to light. Conjunctivae and EOM are normal.  Neck: Normal range of motion. Neck supple. No thyromegaly present.  Cardiovascular: Normal rate, regular rhythm and intact distal pulses.  Pulmonary/Chest: Effort normal and breath sounds normal.  Abdominal: Soft.  Musculoskeletal:  Nonpitting edema only to bilateral feet.  Otherwise at baseline  and not concerning.  Neurological: She is alert and oriented to person, place, and time.  Skin: Skin is warm.  Psychiatric: She has a normal mood and affect. Her behavior is normal.  Nursing note and vitals reviewed.  Assessment and Plan:   Problem  Chronic Midline Low Back Pain With Bilateral Sciatica   Patient does well using Norco prn breakthrough pain. Okay to refill today with the intent for her to use it sparingly. No new concerns or red flags.   Paresthesia of Both Lower Extremities   Previously on Neurontin.   Obesity, Class II, Bmi 35-39.9, With Comorbidity   Improving since being home. The patient is asked to make an attempt to  improve diet and exercise patterns to aid in medical management of this problem.    Hyperlipidemia Associated With Type 2 Diabetes Mellitus ([place])   Lab Results  Component Value Date   CHOL 122 03/25/2017   HDL 55.30 03/25/2017   LDLCALC 44 03/25/2017   TRIG 113.0 03/25/2017   CHOLHDL 2 03/25/2017   Lab Results  Component Value Date   ALT 13 02/08/2018   AST 14 02/08/2018   ALKPHOS 65 02/08/2018   BILITOT 0.4 02/08/2018   Patient tolerating Lipitor without issues.    Hypertension Associated With Diabetes ([place])   Medications reviewed and adjusted today.    Microcytic Anemia   Hgb = 9.5 on 08/03/16. Per previous PCP, noted to be worsening and referral to GI recommended.   CBC Latest Ref Rng & Units 05/12/2018 02/08/2018 10/04/2017  WBC 4.0 - 10.5 K/uL 6.5 6.1 5.8  Hemoglobin 12.0 - 15.0 g/dL 10.9(L) 10.7(L) 10.0(L)  Hematocrit 36.0 - 46.0 % 34.0(L) 33.4(L) 31.8(L)  Platelets 150.0 - 400.0 K/uL 290.0 290.0 260.0   Lab Results  Component Value Date   IRON 50 05/12/2018   TIBC 260 05/12/2018   FERRITIN 243 05/12/2018      Diabetes Mellitus Type 2 in Obese (Hcc)   Current symptoms: no polyuria or polydipsia, no chest pain, dyspnea or TIA's. Taking medication compliantly without noted sided effects [x]   YES  []   NO  Episodes of  hypoglycemia? []   YES  [x]   NO Maintaining a diabetic diet? [x]   YES  []   NO Trying to exercise on a regular basis? []   YES  [x]   NO  On ACE inhibitor or angiotensin II receptor blocker? [x]   YES  []   NO On Aspirin? [x]   YES  []   NO     Orders Placed This Encounter  Procedures  . Pneumococcal conjugate vaccine 13-valent  . Flu vaccine HIGH DOSE PF  . Iron, TIBC and Ferritin Panel  . Hemoglobin A1c  . CBC with Differential/Platelet  . Ambulatory referral to Physical Therapy    Meds ordered this encounter  Medications  . losartan (COZAAR) 50 MG tablet    Sig: Take 1 tablet (50 mg total) by mouth daily.    Dispense:  90 tablet    Refill:  3  . potassium chloride (K-DUR) 10 MEQ tablet    Sig: Two tablets twice a day    Dispense:  90 tablet    Refill:  1  . Tdap (BOOSTRIX) 5-2.5-18.5 LF-MCG/0.5 injection    Sig: Inject 0.5 mLs into the muscle once for 1 dose.    Dispense:  0.5 mL    Refill:  0    . Reviewed expectations re: course of current medical issues. . Discussed self-management of symptoms. . Outlined signs and symptoms indicating need for more acute intervention. . Patient verbalized understanding and all questions were answered. Marland Kitchen Health Maintenance issues including appropriate healthy diet, exercise, and smoking avoidance were discussed with patient. . See orders for this visit as documented in the electronic medical record. . Patient received an After Visit Summary.  CMA served as Education administrator during this visit. History, Physical, and Plan performed by medical provider. The above documentation has been reviewed and is accurate and complete. Briscoe Deutscher, D.O.  Briscoe Deutscher, DO [place], [facility] 05/21/2018

## 2018-05-13 LAB — IRON,TIBC AND FERRITIN PANEL
%SAT: 19 % (calc) (ref 16–45)
Ferritin: 243 ng/mL (ref 16–288)
Iron: 50 ug/dL (ref 45–160)
TIBC: 260 mcg/dL (calc) (ref 250–450)

## 2018-05-16 DIAGNOSIS — Z45018 Encounter for adjustment and management of other part of cardiac pacemaker: Secondary | ICD-10-CM | POA: Diagnosis not present

## 2018-05-16 DIAGNOSIS — Z950 Presence of cardiac pacemaker: Secondary | ICD-10-CM | POA: Diagnosis not present

## 2018-05-17 ENCOUNTER — Ambulatory Visit: Payer: Medicare Other | Admitting: Physical Therapy

## 2018-05-21 ENCOUNTER — Encounter: Payer: Self-pay | Admitting: Family Medicine

## 2018-05-22 ENCOUNTER — Encounter: Payer: Self-pay | Admitting: Obstetrics & Gynecology

## 2018-05-22 ENCOUNTER — Telehealth: Payer: Self-pay | Admitting: Family Medicine

## 2018-05-22 ENCOUNTER — Ambulatory Visit (INDEPENDENT_AMBULATORY_CARE_PROVIDER_SITE_OTHER): Payer: Medicare Other | Admitting: Obstetrics & Gynecology

## 2018-05-22 VITALS — BP 120/72 | HR 76 | Resp 16 | Ht 62.0 in | Wt 214.0 lb

## 2018-05-22 DIAGNOSIS — R32 Unspecified urinary incontinence: Secondary | ICD-10-CM

## 2018-05-22 DIAGNOSIS — N8110 Cystocele, unspecified: Secondary | ICD-10-CM

## 2018-05-22 DIAGNOSIS — Z4689 Encounter for fitting and adjustment of other specified devices: Secondary | ICD-10-CM

## 2018-05-22 NOTE — Progress Notes (Signed)
GYNECOLOGY OFFICE VISIT NOTE  History:  77 y.o. PMP with a complex medical history here today for evaluation and management of "bladder drop" and urinary incontinence. Accompanied by her daughter.  She noticed the drop of her bladder around 07/2017, was evaluated in the ED and referred to Urology.  She reports that the Urologist put her on some medications for the incontinence which did not help, and did not discuss surgical intervention. She desires pessary or surgical intervention as she is "tired of wearing diapers".  She had a hysterectomy many years ago for benign reasons, denies any abnormal vaginal discharge, bleeding, pelvic pain or other concerns.   Patient Active Problem List   Diagnosis Date Noted  . LBBB (left bundle branch block) 11/18/2016  . Pacemaker 11/18/2016  . PAD (peripheral artery disease) ([place]) 11/18/2016  . 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 Chronic midline low back pain with bilateral sciatica 10/02/2016  . Paresthesia of both lower extremities 10/02/2016  . Obesity, Class II, BMI 35-39.9, with comorbidity 10/02/2016  . Hyperlipidemia associated with type 2 diabetes mellitus ([place]) 10/02/2016  . Hypertension associated with diabetes ([place]) 03/08/2016  . Microcytic anemia 03/08/2016  . Diabetes mellitus type 2 in obese ([place]) 03/08/2016  . Complete heart block ([place]) 08/20/2015  . Angina pectoris ([place]) 08/26/2014    Past Medical History:  Diagnosis Date  . Anemia    takes iron daily  . Arthritis   . Diabetes mellitus without complication ([place])   . Hypercholesteremia   . Hypertension     Past Surgical History:  Procedure Laterality Date  . ABDOMINAL HYSTERECTOMY    . COLONOSCOPY WITH PROPOFOL N/A 07/03/2013   Procedure: COLONOSCOPY WITH PROPOFOL;  Surgeon: Garlan Fair, MD;  [facility]: [facility];  Service: Endoscopy;  Laterality: N/A;  . EP IMPLANTABLE DEVICE N/A 08/20/2015   Procedure: Pacemaker Implant;  Surgeon: Evans Lance, MD;   Location: [facility];  Service: Cardiovascular;  Laterality: N/A;  . JOINT REPLACEMENT Bilateral    '99. Knees  . LEFT HEART CATHETERIZATION WITH CORONARY ANGIOGRAM N/A 08/27/2014   Procedure: LEFT HEART CATHETERIZATION WITH CORONARY ANGIOGRAM;  Surgeon: Laverda Page, MD;  Location: [facility] C[facility] LAB;  Service: Cardiovascular;  Laterality: N/A;  . TUBAL LIGATION      The following portions of the patient's history were reviewed and updated as appropriate: allergies, current medications, past family history, past medical history, past social history, past surgical history and problem list.   Health Maintenance:  Normal mammogram on 09/06/2017.   Review of Systems:  Pertinent items noted in HPI and remainder of comprehensive ROS otherwise negative.  Objective:  Physical Exam BP 120/72 (BP Location: Right Arm, Patient Position: Sitting, Cuff Size: Large)   Pulse 76   Resp 16   Ht 5\' 2"  (1.575 m)   Wt 214 lb (97.1 kg)   BMI 39.14 kg/m  CONSTITUTIONAL: Well-developed, well-nourished female in no acute distress.  HEENT:  Normocephalic, atraumatic. External right and left ear normal. No scleral icterus.  NECK: Normal range of motion, supple, no masses noted on observation SKIN: Skin is warm and dry. No rash noted. Not diaphoretic. No erythema. No pallor. NEUROLOGIC: Alert and oriented to person, place, and time. Normal muscle tone coordination. No cranial nerve deficit noted. PSYCHIATRIC: Normal mood and affect. Normal behavior. Normal judgment and thought content. CARDIOVASCULAR: Normal heart rate noted RESPIRATORY: Effort and breath sounds normal, no problems with respiration noted ABDOMEN: Soft, no distention noted.   PELVIC: Normal  appearing external genitalia with moderate atrophy; atrophic appearing vaginal mucosa and cuff.  Grade 2 cystocele noted, comes to edge of introitus with Valsalva. No rectocele or vaginal cuff prolapse noted. No abnormal discharge noted.    Pessary  fitting and placement The length from the cuff to under her urethra meatus was measured, this corresponded to a #2 size ring with support pessary with incontinence knob. The tester was placed, she ambulated without difficulty. The tester was removed and actual pessary was placed. No complications.  Assessment & Plan:  1. Baden-Walker grade 2 cystocele 2. Urinary incontinence, unspecified type 3. Encounter for fitting and adjustment of pessary The patient would return in 1 month for a pessary check. May consider use of estrogen cream but concerned about her cardiac history. Urinary incontinence is likely mixed given her DM and other co-morbidities, she was told this. Also told she was not a good surgical candidate her her health history; but if she wanted to discuss surgical intervention, she will be referred to Urogynecology. She was told to call/come back if pessary falls out or for other issues.   Routine preventative health maintenance measures emphasized. Please refer to After Visit Summary for other counseling recommendations.   Return in about 1 month (around 06/21/2018) for Pessary check.  Total face-to-face time with patient: 20 minutes.  Over 50% of encounter was spent on counseling and coordination of care.   Verita Schneiders, MD, [place] for [employer], [place]

## 2018-05-22 NOTE — Telephone Encounter (Signed)
Let patient know she should be on the losartan 50. She will call if any further questions.

## 2018-05-22 NOTE — Telephone Encounter (Signed)
Please advise 

## 2018-05-22 NOTE — Telephone Encounter (Signed)
Plain Losartan. We took away all of the HCTZ at the last visit. Should be in the scribed notes.

## 2018-05-22 NOTE — Telephone Encounter (Signed)
Copied from [place] [id number]. Topic: General - Other >> May 22, 2018 10:03 AM Lennox Solders wrote: Reason for CRM: pt is calling she has 2 bp medication losartan 50-12.5 mg  one with hctz and other just plain losartan 50 mg. Pt would like to know which losartan she should be taking

## 2018-05-22 NOTE — Patient Instructions (Signed)
Return to clinic for any scheduled appointments or for any gynecologic concerns as needed.   

## 2018-05-30 ENCOUNTER — Encounter: Payer: Self-pay | Admitting: Physical Therapy

## 2018-05-30 ENCOUNTER — Ambulatory Visit: Payer: Medicare Other | Attending: Family Medicine | Admitting: Physical Therapy

## 2018-05-30 ENCOUNTER — Other Ambulatory Visit: Payer: Self-pay | Admitting: Family Medicine

## 2018-05-30 ENCOUNTER — Other Ambulatory Visit: Payer: Self-pay

## 2018-05-30 DIAGNOSIS — R42 Dizziness and giddiness: Secondary | ICD-10-CM | POA: Insufficient documentation

## 2018-05-30 DIAGNOSIS — R2681 Unsteadiness on feet: Secondary | ICD-10-CM | POA: Insufficient documentation

## 2018-05-30 NOTE — Therapy (Addendum)
[place] [facility] [address] [place], [place], [address] Phone: [phone]   Fax:  [phone]  Physical Therapy Evaluation  Patient Details  Name: Krista Clarke MRN: [id number] Date of Birth: 06-Aug-1941 Referring Provider (PT): Dr. Juleen China   Encounter Date: 05/30/2018  PT End of Session - 05/30/18 1130    Visit Number  1    Number of Visits  5    Date for PT Re-Evaluation  06/27/18    Authorization Type  [employer] Medicare    PT Start Time  0926    PT Stop Time  1016    PT Time Calculation (min)  50 min    Activity Tolerance  Patient tolerated treatment well    Behavior During Therapy  [facility] for tasks assessed/performed       Past Medical History:  Diagnosis Date  . Anemia    takes iron daily  . Arthritis   . Diabetes mellitus without complication ([name])   . Hypercholesteremia   . Hypertension     Past Surgical History:  Procedure Laterality Date  . ABDOMINAL HYSTERECTOMY    . COLONOSCOPY WITH PROPOFOL N/A 07/03/2013   Procedure: COLONOSCOPY WITH PROPOFOL;  Surgeon: Garlan Fair, MD;  [facility];  Service: Endoscopy;  Laterality: N/A;  . EP IMPLANTABLE DEVICE N/A 08/20/2015   Procedure: Pacemaker Implant;  Surgeon: Evans Lance, MD;  Location: [place] [facility];  Service: Cardiovascular;  Laterality: N/A;  . JOINT REPLACEMENT Bilateral    '99. Knees  . LEFT HEART CATHETERIZATION WITH CORONARY ANGIOGRAM N/A 08/27/2014   Procedure: LEFT HEART CATHETERIZATION WITH CORONARY ANGIOGRAM;  Surgeon: Laverda Page, MD;  Location: [facility] CATH LAB;  Service: Cardiovascular;  Laterality: N/A;  . TUBAL LIGATION      There were no vitals filed for this visit.   Subjective Assessment - 05/30/18 0928    Subjective  patient reporting a history of vertigo - has been experiencing this for 5 years off and on. Patient reporting symptoms when getting in bed, walking around, sometimes in the community. Describes a  sensation of room spinning. Reports dizziness may be present for days. Patient reporting no falls. Headaches come with dizziness.     Pertinent History  LBBB, pacemaker, PAD, LBP with sciatica, DM 2, HTN    Patient Stated Goals  improve dizziness, fix dizziness quickly    Currently in Pain?  Yes    Pain Score  3     Pain Location  --   generalized   Pain Descriptors / Indicators  Aching;Sore    Pain Type  Chronic pain    Pain Onset  More than a month ago    Pain Frequency  Intermittent         [facility] PT Assessment - 05/30/18 0933      Assessment   Medical Diagnosis  Vertigo    Referring Provider (PT)  Dr. Juleen China    Onset Date/Surgical Date  --   ~5 years ago   Next MD Visit  06/22/18    Prior Therapy  no      Precautions   Precautions  Fall      Restrictions   Weight Bearing Restrictions  No      Balance Screen   Has the patient fallen in the past 6 months  No    Has the patient had a decrease in activity level because of a fear of falling?   Yes    Is the patient  reluctant to leave their home because of a fear of falling?   No      Home Environment   Living Environment  Private residence    Living Arrangements  Alone    Type of [place] to enter    Entrance Stairs-Number of Steps  3    Entrance Stairs-Rails  Can reach both    [place]  One level    [place] - 4 wheels      Prior Function   Level of Independence  Independent    Vocation  Retired      Associate Professor   Overall Cognitive Status  Within Functional Limits for tasks assessed      Sensation   Light Touch  Impaired by gross assessment    Additional Comments  reports neuropathy at B feet - numb and tingly      Posture/Postural Control   Posture/Postural Control  Postural limitations    Postural Limitations  Rounded Shoulders;Forward head      ROM / Strength   AROM / PROM / Strength  AROM      AROM   AROM Assessment Site  Cervical    Cervical Flexion  WNL -  discomfort at anterior neck    Cervical Extension  50% limited - discomfort at anterior neck    Cervical - Right Side Bend  25% limited - no pain    Cervical - Left Side Bend  75% limited - tightness    Cervical - Right Rotation  25% limited - tightness    Cervical - Left Rotation  50% limited - tightness      Palpation   Palpation comment  TTP at B UT, cervical musculature      Ambulation/Gait   Ambulation/Gait  Yes    Ambulation/Gait Assistance  6: Modified independent (Device/Increase time);5: Supervision    Ambulation/Gait Assistance Details  supervision with turning to sit from chair to mat table    Ambulation Distance (Feet)  50 Feet    Assistive device  Rollator    Gait Pattern  Step-to pattern;Step-through pattern;Decreased stride length;Trunk flexed;Wide base of support    Ambulation Surface  Level;Indoor    Gait Comments  slow pace of gait with heavy forward flexion           Vestibular Assessment - 05/30/18 0940      Vestibular Assessment   General Observation  wears glasses most of day - at home does not wear      Symptom Behavior   Type of Dizziness  Spinning    Frequency of Dizziness  8x this past month    Duration of Dizziness  1-2 hours    Aggravating Factors  Rolling to right;Rolling to left;Forward bending;Supine to sit;Sit to stand    Relieving Factors  Rest;Slow movements      Occulomotor Exam   Occulomotor Alignment  Normal    Spontaneous  Absent    Head shaking Horizontal  Absent    Smooth Pursuits  Intact    Saccades  Intact   patient blinks with every eye movement - hard to assess     Positional Testing   Dix-Hallpike  Dix-Hallpike Right;Dix-Hallpike Left      Dix-Hallpike Right   Dix-Hallpike Right Symptoms  No nystagmus      Dix-Hallpike Left   Dix-Hallpike Left Symptoms  No nystagmus      Cognition   Cognition Orientation Level  Appropriate for  developmental age          Objective measurements completed on examination: See  above findings.              PT Education - 05/30/18 1027    Education Details  exam findings, POC, justification for skilled treatment, initial HEP; discussion of vertigo vs hypofunction vs cervicogenic component as patient stated she thought today would be a "quick fix"    Person(s) Educated  Patient    Methods  Explanation;Demonstration;Handout    Comprehension  Verbalized understanding       PT Short Term Goals - 05/30/18 1201      PT SHORT TERM GOAL #1   Title  STGs = LTGs        PT Long Term Goals - 05/30/18 1201      PT LONG TERM GOAL #1   Title  Patient to be independent with HEP for vestibular, cervical ROM, posturing    Time  4    Period  Weeks    Status  New    Target Date  06/27/18      PT LONG TERM GOAL #2   Title  patient to report reduction in frequency, intensity, and duration of dizzy symptoms by >/= 50%    Baseline  dizzy 8x/month, last hours to days    Time  4    Period  Weeks    Status  New    Target Date  06/27/18      PT LONG TERM GOAL #3   Title  patient to improve cervical AROM to [facility] in all planes without pain provocation    Baseline  see above    Time  4    Period  Weeks    Status  New    Target Date  06/27/18      PT LONG TERM GOAL #4   Title  assess Berg/SOT as indicated and goal    Time  4    Period  Weeks    Status  New    Target Date  06/27/18             Plan - 05/30/18 1018    Clinical Impression Statement  Ms. Kentner presenting to OPPT today regaridng primary complaints of dizziness. Patient with difficulty stating frequency of symptoms as well as aggravating factors. When PT promopting patient to describe dizzy symptoms in clinic patient also with difficulty. Patient with reduced cervical AROM today in all planes with patient reporting tightness and discomfort with motion. Oculomotor exam normal but with required cueing to only move eyes rather than head and eyes. Positional testing negative with no signs of  nystagmus. Dizziness with signs and symptoms most likely correlated with cervivcogenic origin vs hypofunction, currently - will continue to assess for positional vertigo. Today given initial HEP for VOR x 1 as well as cervical rotation and sidebending for hopeful improvements in cervical ROM. Patient to benefit from skilled PT intervention to address dizziness, cervical motion, posturing as well as balance for reduced fall risk.     History and Personal Factors relevant to plan of care:  LBBB, pacemaker, PAD, LBP with sciatica, DM 2, HTN    Clinical Presentation  Evolving    Clinical Presentation due to:  LBBB, pacemaker, PAD, LBP with sciatica, DM 2, HTN, requires use of rollator, does not drive, reduced activity level due to fear of falling, dizziness, poor posturing    Clinical Decision Making  Moderate    Rehab Potential  Good    PT Frequency  1x / week    PT Duration  4 weeks    PT Treatment/Interventions  ADLs/Self Care Home Management;Canalith Repostioning;DME Instruction;Traction;Moist Heat;Gait training;Stair training;Functional mobility training;Therapeutic activities;Therapeutic exercise;Balance training;Patient/family education;Neuromuscular re-education;Manual techniques;Vestibular;Taping;Visual/perceptual remediation/compensation;Cryotherapy    PT Next Visit Plan  re-assess positional vertigo as needed, progress cervical ROM, manual prn, progress VOR as able    PT Home Exercise Plan  [id number]    Consulted and Agree with Plan of Care  Patient       Patient will benefit from skilled therapeutic intervention in order to improve the following deficits and impairments:  Dizziness, Decreased activity tolerance, Decreased balance, Postural dysfunction  Visit Diagnosis: Dizziness and giddiness  Unsteadiness on feet     Problem List Patient Active Problem List   Diagnosis Date Noted  . LBBB (left bundle branch block) 11/18/2016  . Pacemaker 11/18/2016  . PAD (peripheral artery  disease) ([place]) 11/18/2016  . 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[place]) 10/03/2016  . Chronic midline low back pain with bilateral sciatica 10/02/2016  . Paresthesia of both lower extremities 10/02/2016  . Obesity, Class II, BMI 35-39.9, with comorbidity 10/02/2016  . Hyperlipidemia associated with type 2 diabetes mellitus ([place]) 10/02/2016  . Hypertension associated with diabetes ([place]) 03/08/2016  . Microcytic anemia 03/08/2016  . Diabetes mellitus type 2 in obese ([place]) 03/08/2016  . Complete heart block ([place]) 08/20/2015  . Angina pectoris ([place]) 08/26/2014     Krista Clarke, PT, DPT Supplemental Physical Therapist 05/30/18 12:07 PM Pager: [phone] Office: [place] [facility] [address] [name], [place], [address] Phone: [phone]   Fax:  [phone]  Name: Krista Clarke MRN: [id number] Date of Birth: [birth year]   PHYSICAL THERAPY DISCHARGE SUMMARY  Visits from Start of Care: 1   Plan: Patient agrees to discharge.  Patient goals were not met. Patient is being discharged due to not returning since the last visit.  ?????     Krista Clarke, PT, DPT 2:31 PM  11/15/18

## 2018-06-08 ENCOUNTER — Telehealth: Payer: Self-pay | Admitting: Family Medicine

## 2018-06-08 ENCOUNTER — Other Ambulatory Visit: Payer: Medicare Other

## 2018-06-08 DIAGNOSIS — D509 Iron deficiency anemia, unspecified: Secondary | ICD-10-CM

## 2018-06-08 NOTE — Telephone Encounter (Signed)
Copied from [place] [id number]. Topic: Quick Communication - See Telephone Encounter >> Jun 08, 2018  4:20 PM Bea Graff, NT wrote: CRM for notification. See Telephone encounter for: 06/08/18. Amado Coe with [facility] calling to request for the order for the hemoccult card to be placed in the system so that they may result this asap. CB#: [phone]

## 2018-06-09 ENCOUNTER — Other Ambulatory Visit: Payer: Self-pay

## 2018-06-09 ENCOUNTER — Other Ambulatory Visit (INDEPENDENT_AMBULATORY_CARE_PROVIDER_SITE_OTHER): Payer: Medicare Other

## 2018-06-09 DIAGNOSIS — Z1212 Encounter for screening for malignant neoplasm of rectum: Secondary | ICD-10-CM | POA: Diagnosis not present

## 2018-06-09 DIAGNOSIS — Z1211 Encounter for screening for malignant neoplasm of colon: Secondary | ICD-10-CM

## 2018-06-09 LAB — FECAL OCCULT BLOOD, IMMUNOCHEMICAL: Fecal Occult Bld: NEGATIVE

## 2018-06-09 NOTE — Telephone Encounter (Signed)
Order already done.

## 2018-06-12 ENCOUNTER — Other Ambulatory Visit: Payer: Self-pay | Admitting: Family Medicine

## 2018-06-12 DIAGNOSIS — E119 Type 2 diabetes mellitus without complications: Secondary | ICD-10-CM | POA: Diagnosis not present

## 2018-06-12 DIAGNOSIS — I10 Essential (primary) hypertension: Secondary | ICD-10-CM | POA: Diagnosis not present

## 2018-06-12 NOTE — Telephone Encounter (Signed)
Please advise on refill.

## 2018-06-12 NOTE — Telephone Encounter (Signed)
Requested medication (s) are due for refill today:   No  Asking a month early  Requested medication (s) are on the active medication list:   Yes  Future visit scheduled:   Yes  06/27/18 with Dr. Juleen China   Last ordered: 05/04/18  #120  0 refills   Requested Prescriptions  Pending Prescriptions Disp Refills   HYDROcodone-acetaminophen (NORCO/VICODIN) 5-325 MG tablet 120 tablet 0    Sig: Take 1 tablet by mouth 4 (four) times daily as needed (Chronic Pain).     Not Delegated - Analgesics:  Opioid Agonist Combinations Failed - 06/12/2018 12:25 PM      Failed - This refill cannot be delegated      Failed - Urine Drug Screen completed in last 360 days.      Passed - Valid encounter within last 6 months    Recent Outpatient Visits          1 month ago Microcytic anemia   [place], [place], [place]   4 months ago Hypertension associated with diabetes [facility])   [place], [place], DO   6 months ago Bilateral lower extremity edema   [facility] [place], [place], DO   7 months ago Hypertension associated with diabetes [facility])   [place], [place], DO   8 months ago Hypertension associated with diabetes [facility])   [place], Danae [place], DO      Future Appointments            In 2 weeks Briscoe Deutscher, [place], [facility]

## 2018-06-12 NOTE — Telephone Encounter (Signed)
Copied from [place] [id number]. Topic: Quick Communication - See Telephone Encounter >> Jun 12, 2018 10:49 AM [place] Chancy, NT wrote: CRM for notification. See Telephone encounter for: 06/12/18.  Patient is calling and requesting a refill on HYDROcodone-acetaminophen (NORCO/VICODIN) 5-325 MG tablet.  [place], [place] - [address] [place] [address] Phone: [phone] Fax: [phone]

## 2018-06-13 MED ORDER — HYDROCODONE-ACETAMINOPHEN 5-325 MG PO TABS: 1.0000 | ORAL_TABLET | Freq: Four times a day (QID) | ORAL | 0 refills | Status: DC | PRN

## 2018-06-14 ENCOUNTER — Ambulatory Visit: Payer: Medicare Other

## 2018-06-20 ENCOUNTER — Encounter: Payer: Self-pay | Admitting: Obstetrics & Gynecology

## 2018-06-20 ENCOUNTER — Ambulatory Visit (INDEPENDENT_AMBULATORY_CARE_PROVIDER_SITE_OTHER): Payer: Medicare Other | Admitting: Obstetrics & Gynecology

## 2018-06-20 VITALS — BP 130/80 | HR 72

## 2018-06-20 DIAGNOSIS — N8110 Cystocele, unspecified: Secondary | ICD-10-CM

## 2018-06-20 DIAGNOSIS — R32 Unspecified urinary incontinence: Secondary | ICD-10-CM

## 2018-06-20 NOTE — Patient Instructions (Signed)
Return to clinic for any scheduled appointments or for any gynecologic concerns as needed.   

## 2018-06-20 NOTE — Progress Notes (Signed)
    GYNECOLOGY CLINIC PROGRESS NOTE  The patient presented today for a pessary check. She had insertion of #2 size ring with support pessary with incontinence knob on 05/22/2018. Since then, she reports improvement of symptoms.  She reports no vaginal bleeding or discharge. She denies pelvic discomfort and difficulty urinating or moving her bowels.  Blood pressure 130/80, pulse 72. On pelvic exam, patient was noted not to have any pessary in place. Normal vaginal mucosa with no lesions or lacerations.  Patient is unaware of it falling out, wants it replaced.  Tried to place a #3 ring with support pessary; this was too uncomfortable for patient and it was removed. Will order another  #2 size ring with support pessary with incontinence knob for patient, and will call her once it is here for replacement. Patient agrees with this plan.   Total face-to-face time with patient: 10 minutes. Over 50% of encounter was spent on counseling and coordination of care.   Verita Schneiders, MD, [place] for [employer], [place]

## 2018-06-22 ENCOUNTER — Ambulatory Visit: Payer: Medicare Other

## 2018-06-22 ENCOUNTER — Other Ambulatory Visit: Payer: Medicare Other

## 2018-06-26 ENCOUNTER — Other Ambulatory Visit: Payer: Self-pay | Admitting: Family Medicine

## 2018-06-26 DIAGNOSIS — R600 Localized edema: Secondary | ICD-10-CM

## 2018-06-26 NOTE — Progress Notes (Signed)
Subjective:    Krista Clarke is a 77 y.o. female and is here for a comprehensive physical exam. She is interested in pill packs since we discussed at a previous visit. She is also interested in Home PT for frequent bouts of vertigo and difficulty walking due to deconditioning. She is compliant with all medications. Taking Neurontin 100 TID for LE paresthesias. Not controlled well. No sedation. Recent BP med changes through Dr. Einar Gip. Anemia labs today.   There are no preventive care reminders to display for this patient.  Current Outpatient Medications:  .  allopurinol (ZYLOPRIM) 100 MG tablet, TAKE 1 TABLET BY MOUTH EVERY MORNING, Disp: 90 tablet, Rfl: 0 .  amLODipine (NORVASC) 10 MG tablet, , Disp: , Rfl:  .  ammonium lactate (LAC-HYDRIN) 12 % cream, Apply topically as needed for dry skin., Disp: 385 g, Rfl: 0 .  aspirin 81 MG chewable tablet, Chew by mouth daily., Disp: , Rfl:  .  atorvastatin (LIPITOR) 40 MG tablet, Take 40 mg by mouth daily. , Disp: , Rfl:  .  Calcium Citrate-Vitamin D (CALCIUM CITRATE + D PO), Take 1 tablet by mouth daily., Disp: , Rfl:  .  clopidogrel (PLAVIX) 75 MG tablet, TAKE 1 TABLET BY MOUTH ONCE DAILY, Disp: 90 tablet, Rfl: 0 .  ferrous sulfate 325 (65 FE) MG EC tablet, Take 325 mg by mouth 2 (two) times daily., Disp: , Rfl:  .  furosemide (LASIX) 20 MG tablet, TAKE 1 TABLET BY MOUTH ONCE DAILY, Disp: 20 tablet, Rfl: 0 .  gabapentin (NEURONTIN) 100 MG capsule, Take two tablets three times a day., Disp: 180 capsule, Rfl: 3 .  HYDROcodone-acetaminophen (NORCO/VICODIN) 5-325 MG tablet, Take 1 tablet by mouth 4 (four) times daily as needed (Chronic Pain)., Disp: 120 tablet, Rfl: 0 .  losartan-hydrochlorothiazide (HYZAAR) 50-12.5 MG tablet, Take 1 tablet by mouth daily., Disp: , Rfl:  .  Lutein 20 MG TABS, Take by mouth daily. , Disp: , Rfl:  .  magnesium oxide (MAG-OX) 400 MG tablet, Take 400 mg by mouth daily., Disp: , Rfl:  .  metFORMIN (GLUCOPHAGE) 1000 MG tablet,  Take 1 tablet (1,000 mg total) by mouth 2 (two) times daily with a meal., Disp: 60 tablet, Rfl: 11 .  metoprolol tartrate (LOPRESSOR) 25 MG tablet, Take 25 mg by mouth 2 (two) times daily., Disp: , Rfl: 2 .  mirabegron ER (MYRBETRIQ) 50 MG TB24 tablet, Take 50 mg by mouth daily., Disp: , Rfl:  .  potassium chloride (K-DUR) 10 MEQ tablet, Two tablets twice a day, Disp: 90 tablet, Rfl: 1 .  vitamin C (ASCORBIC ACID) 500 MG tablet, Take 500 mg by mouth daily., Disp: , Rfl:   PMHx, SurgHx, SocialHx, Medications, and Allergies were reviewed in the Visit Navigator and updated as appropriate.   Past Medical History:  Diagnosis Date  . Anemia    takes iron daily  . Arthritis   . Diabetes mellitus without complication ([place])   . Hypercholesteremia   . Hypertension      Past Surgical History:  Procedure Laterality Date  . ABDOMINAL HYSTERECTOMY    . COLONOSCOPY WITH PROPOFOL N/A 07/03/2013   Procedure: COLONOSCOPY WITH PROPOFOL;  Surgeon: Garlan Fair, MD;  Location: [facility];  Service: Endoscopy;  Laterality: N/A;  . EP IMPLANTABLE DEVICE N/A 08/20/2015   Procedure: Pacemaker Implant;  Surgeon: Evans Lance, MD;  Location: [place] CV LAB;  Service: Cardiovascular;  Laterality: N/A;  . JOINT REPLACEMENT Bilateral    '  99. Knees  . LEFT HEART CATHETERIZATION WITH CORONARY ANGIOGRAM N/A 08/27/2014   Procedure: LEFT HEART CATHETERIZATION WITH CORONARY ANGIOGRAM;  Surgeon: Laverda Page, MD;  Location: [facility] CATH LAB;  Service: Cardiovascular;  Laterality: N/A;  . TUBAL LIGATION       Family History  Problem Relation Age of Onset  . Hypertension Mother   . Diabetes Mother   . Hypertension Father   . Cancer Brother     Social History   Tobacco Use  . Smoking status: Never Smoker  . Smokeless tobacco: Never Used  Substance Use Topics  . Alcohol use: No  . Drug use: No    Review of Systems:   Pertinent items are noted in the HPI. Otherwise, ROS is  negative.  Objective:   BP 136/68   Pulse 92   Temp 97.8 F (36.6 C) (Oral)   Ht 5\' 2"  (1.575 m)   Wt 214 lb 6.4 oz (97.3 kg)   SpO2 98%   BMI 39.21 kg/m   General appearance: alert, cooperative and appears stated age. Head: normocephalic, without obvious abnormality, atraumatic. Neck: no adenopathy, supple, symmetrical, trachea midline; thyroid not enlarged, symmetric, no tenderness/mass/nodules. Lungs: clear to auscultation bilaterally. Heart: regular rate and rhythm. Abdomen: soft, non-tender; no masses,  no organomegaly. Extremities: extremities normal, atraumatic, no cyanosis or edema. Skin: skin color, texture, turgor normal, no rashes or lesions. Lymph: cervical, supraclavicular, and axillary nodes normal; no abnormal inguinal nodes palpated. Neurologic: grossly normal.  Assessment/Plan:   Enaya was seen today for follow-up.  Diagnoses and all orders for this visit:  Routine physical examination  Bilateral lower extremity edema Comments: Mild to moderate. Intermittent. Made better with leg elevation.  Orders: -     gabapentin (NEURONTIN) 100 MG capsule; Take two tablets three times a day. -     Ambulatory referral to Home Health  Hyperlipidemia associated with type 2 diabetes mellitus ([place]) -     AMB Referral to [place] Management  Hypertension associated with diabetes ([place]), on Norvasc, Hyzaar, Lasix/K, Metoprolol, followed by Cardiology -     AMB Referral to [place] Management  Morbid obesity ([place])  Paresthesia of both lower extremities Comments: Increase Gabapentin to 2 po TID.  Orders: -     AMB Referral to [place] Management   Patient Counseling: [x]    Nutrition: Stressed importance of moderation in sodium/caffeine intake, saturated fat and cholesterol, caloric balance, sufficient intake of fresh fruits, vegetables, fiber, calcium, iron, and 1 mg of folate supplement per day (for females capable of pregnancy).  [x]    Stressed the importance of  regular exercise.   [x]    Substance Abuse: Discussed cessation/primary prevention of tobacco, alcohol, or other drug use; driving or other dangerous activities under the influence; availability of treatment for abuse.   [x]    Injury prevention: Discussed safety belts, safety helmets, smoke detector, smoking near bedding or upholstery.   [x]    Sexuality: Discussed sexually transmitted diseases, partner selection, use of condoms, avoidance of unintended pregnancy  and contraceptive alternatives.  [x]    Dental health: Discussed importance of regular tooth brushing, flossing, and dental visits.  [x]    Health maintenance and immunizations reviewed. Please refer to Health maintenance section.   Briscoe Deutscher, DO [place]

## 2018-06-27 ENCOUNTER — Ambulatory Visit (INDEPENDENT_AMBULATORY_CARE_PROVIDER_SITE_OTHER): Payer: Medicare Other | Admitting: Family Medicine

## 2018-06-27 ENCOUNTER — Encounter: Payer: Self-pay | Admitting: Family Medicine

## 2018-06-27 VITALS — BP 136/68 | HR 92 | Temp 97.8°F | Ht 62.0 in | Wt 214.4 lb

## 2018-06-27 DIAGNOSIS — I152 Hypertension secondary to endocrine disorders: Secondary | ICD-10-CM

## 2018-06-27 DIAGNOSIS — I10 Essential (primary) hypertension: Secondary | ICD-10-CM

## 2018-06-27 DIAGNOSIS — R600 Localized edema: Secondary | ICD-10-CM | POA: Diagnosis not present

## 2018-06-27 DIAGNOSIS — E785 Hyperlipidemia, unspecified: Secondary | ICD-10-CM

## 2018-06-27 DIAGNOSIS — R202 Paresthesia of skin: Secondary | ICD-10-CM

## 2018-06-27 DIAGNOSIS — E1169 Type 2 diabetes mellitus with other specified complication: Secondary | ICD-10-CM

## 2018-06-27 DIAGNOSIS — E1159 Type 2 diabetes mellitus with other circulatory complications: Secondary | ICD-10-CM

## 2018-06-27 DIAGNOSIS — R32 Unspecified urinary incontinence: Secondary | ICD-10-CM | POA: Insufficient documentation

## 2018-06-27 DIAGNOSIS — Z0000 Encounter for general adult medical examination without abnormal findings: Secondary | ICD-10-CM | POA: Diagnosis not present

## 2018-06-27 MED ORDER — GABAPENTIN 100 MG PO CAPS: ORAL_CAPSULE | ORAL | 3 refills | Status: DC

## 2018-06-28 ENCOUNTER — Encounter: Payer: Self-pay | Admitting: Family Medicine

## 2018-06-28 ENCOUNTER — Other Ambulatory Visit: Payer: Self-pay

## 2018-06-28 NOTE — Patient Outreach (Addendum)
[place] [facility]) Care Management  06/28/2018  Krista Clarke 05/21/1941 [id number]   Referral Date: 06/28/18 Referral Source: MD referral Referral Reason: Medication management   Outreach Attempt: spoke with patient. She is able to verify HIPAA.  Discussed referral with patient.  She states she is in need of help with her medications.  She states that her daughter was fixing her pill box but things got to be too much.  Discussed [facility] services and how we could support.  She is agreeable to pharmacy only at this time.    Patient lives alone but has support of children regularly.  Patient states she gets meals on wheels once a week and other times she has things bought to her.  Patient admits to diabetes, HTN, pacemaker, and neuropathy.  Her A1c is better and patient declines any further education and support for her diabetes.  Patient reports she has transportation to appointments.    Patient given CM contact information for any questions or concerns prior to pharmacy outreach.     Plan: RN CM will refer patient to pharmacy for medication management.    Addendum: spoke with daughter [name] concerning the referral.  She welcomes the assistance with medications but request that pharmacist contact her as she manages medications.  Advised her that I would notify pharmacist of the request.  She verbalized understanding.    Plan: RN CM sent in basket to pharmacist concerning contacting daughter.     Jone Baseman, RN, MSN [facility] Care Management Care Management Coordinator Direct Line [phone] Toll Free: [phone]  Fax: [phone]

## 2018-06-29 ENCOUNTER — Encounter: Payer: Medicare Other | Admitting: Family Medicine

## 2018-07-06 ENCOUNTER — Ambulatory Visit: Payer: Self-pay | Admitting: Pharmacist

## 2018-07-06 ENCOUNTER — Other Ambulatory Visit: Payer: Self-pay | Admitting: Pharmacist

## 2018-07-06 NOTE — Patient Outreach (Signed)
[facility]) Care Management  [place]  07/06/2018  Krista Clarke 1940/11/14 [id number]   Reason for referral: medication management/compliance packs  Unsuccessful telephone call attempt #1 to patient.   HIPAA compliant voicemail left requesting a return call  Plan:  I will make another outreach attempt to patient within 3-4 business days  [name], PharmD, [place]  [phone]

## 2018-07-07 ENCOUNTER — Ambulatory Visit: Payer: Self-pay | Admitting: Pharmacist

## 2018-07-07 ENCOUNTER — Other Ambulatory Visit: Payer: Self-pay | Admitting: Pharmacist

## 2018-07-07 NOTE — Patient Outreach (Signed)
[facility]) Care Management  [place]  07/07/2018  Krista Clarke [birth year] [id number]   Reason for referral: medication management  Unsuccessful telephone call attempt #2 to patient.   HIPAA compliant voicemail left requesting a return call  Plan:  I will make another outreach attempt to patient within 3-4 business days  I will mail unsuccessful letter  [name], PharmD, [place]  [phone]

## 2018-07-12 ENCOUNTER — Other Ambulatory Visit: Payer: Self-pay | Admitting: Family Medicine

## 2018-07-12 ENCOUNTER — Ambulatory Visit: Payer: Self-pay | Admitting: Pharmacist

## 2018-07-12 ENCOUNTER — Other Ambulatory Visit: Payer: Self-pay | Admitting: Pharmacist

## 2018-07-12 DIAGNOSIS — R600 Localized edema: Secondary | ICD-10-CM

## 2018-07-12 DIAGNOSIS — I152 Hypertension secondary to endocrine disorders: Secondary | ICD-10-CM

## 2018-07-12 DIAGNOSIS — E1159 Type 2 diabetes mellitus with other circulatory complications: Secondary | ICD-10-CM

## 2018-07-12 DIAGNOSIS — I10 Essential (primary) hypertension: Secondary | ICD-10-CM

## 2018-07-12 NOTE — Telephone Encounter (Signed)
Copied from [place] [id number]. Topic: Quick Communication - Rx Refill/Question >> Jul 12, 2018  4:47 PM Sheppard Coil, [employer] wrote: Medication:  allopurinol (ZYLOPRIM) 100 MG tablet   amLODipine (NORVASC) 10 MG tablet  ammonium lactate (LAC-HYDRIN) 12 % cream  aspirin 81 MG chewable tablet  atorvastatin (LIPITOR) 40 MG tablet  Calcium Citrate-Vitamin D (CALCIUM CITRATE + D PO)  clopidogrel (PLAVIX) 75 MG tablet  ferrous sulfate 325 (65 FE) MG EC tablet  furosemide (LASIX) 20 MG tablet  gabapentin (NEURONTIN) 100 MG capsule  HYDROcodone-acetaminophen (NORCO/VICODIN) 5-325 MG tablet  losartan-hydrochlorothiazide (HYZAAR) 50-12.5 MG tablet  Lutein 20 MG TABS  Magnesium oxide (MAG-OX) 400 MG tablet  metFORMIN (GLUCOPHAGE) 1000 MG tablet  metoprolol tartrate (LOPRESSOR) 25 MG tablet  mirabegron ER (MYRBETRIQ) 50 MG TB24 tablet  omeprazole (PRILOSEC) 20 MG capsule  potassium chloride (K-DUR) 10 MEQ tablet  vitamin C (ASCORBIC ACID) 500 MG tablet   Almyra Free a pharmacist with [employer] calling in.  Pt is transitioning to pill packs and they need all medications RX and OTC to be called in to [place] so that can be done for her.  States that pt's medication was packed for her for this week only by [facility] so new script needed so this can be gotten next week.   Almyra Free also states that Norco pain pill is also needed - pt only has a 2 day supply of this. Almyra Free can be reached at [phone]  Preferred Pharmacy (with phone number or street name): [place], [place] [phone] (Phone) [phone] (Fax)  Agent: Please be advised that RX refills may take up to 3 business days. We ask that you follow-up with your pharmacy.

## 2018-07-12 NOTE — Patient Outreach (Addendum)
[place] [facility]) Care Management  [place]  07/14/2018  Krista Clarke Jan 09, 1941 [id number]  Reason for referral: medication management/compliance packs  Successful [facility] CM Pharmacy home visit to assist in transitioning patient to compliance packs with [facility].  [name] was present for home visit with HIPAA identifiers verified x2.  Patient is currently organizing medications neatly in a basket, however they feel that compliance packs could be beneficial with multiple medications.  Patient's regimen appears to be stable Successful outreach call to patient's PCP, Dr. Briscoe Deutscher to request that new RXs be called in to [place]. Will not package Norco (hydrocodone/APAP) in compliance packs given it is only taken as needed.  Medication list reviewed and updated in [facility]. Medications were delivered to [place] by [facility] Pharmacist along with current medication list (per patient request).  Patient's pill box was filled for 1 -week while awaiting this transition.  Patient will have compliance packs delivered.  Objective: Lab Results  Component Value Date   CREATININE 0.98 02/08/2018   CREATININE 0.97 10/04/2017   CREATININE 1.04 03/25/2017    Lab Results  Component Value Date   HGBA1C 7.8 (H) 05/12/2018    Lipid Panel     Component Value Date/Time   CHOL 122 03/25/2017 1047   TRIG 113.0 03/25/2017 1047   HDL 55.30 03/25/2017 1047   CHOLHDL 2 03/25/2017 1047   VLDL 22.6 03/25/2017 1047   LDLCALC 44 03/25/2017 1047    BP Readings from Last 3 Encounters:  06/27/18 136/68  06/20/18 130/80  05/22/18 120/72    Allergies  Allergen Reactions  . Prednisone Palpitations and Other (See Comments)    Doesn't work well for patient    Medications Reviewed Today    Reviewed by Lavera Guise, [facility] (Pharmacist) on 07/12/18 at [place] List Status: <None>  Medication Order Taking? Sig Documenting Provider Last Dose  Status Informant  allopurinol (ZYLOPRIM) 100 MG tablet [id number] Yes TAKE 1 TABLET BY MOUTH EVERY MORNING Briscoe Deutscher, DO Taking Active   amLODipine (NORVASC) 10 MG tablet [id number] Yes  [provider] Taking Active   ammonium lactate (LAC-HYDRIN) 12 % cream [id number] No Apply topically as needed for dry skin.  Patient not taking:  Reported on 07/12/2018   Gardiner Barefoot, DPM Not Taking Active   aspirin 81 MG chewable tablet [id number] Yes Chew by mouth daily. [provider] Taking Active   atorvastatin (LIPITOR) 40 MG tablet [id number] Yes Take 40 mg by mouth daily.  [provider] Taking Active   Calcium Citrate-Vitamin D (CALCIUM CITRATE + D PO) [id number] Yes Take 1 tablet by mouth daily. [provider] Taking Active Multiple Informants  clopidogrel (PLAVIX) 75 MG tablet [id number] Yes TAKE 1 TABLET BY MOUTH ONCE DAILY Briscoe Deutscher, DO Taking Active   ferrous sulfate 325 (65 FE) MG EC tablet [id number] Yes Take 325 mg by mouth 2 (two) times daily. [provider] Taking Active Multiple Informants  furosemide (LASIX) 20 MG tablet [id number] Yes TAKE 1 TABLET BY MOUTH ONCE DAILY Briscoe Deutscher, DO Taking Active   gabapentin (NEURONTIN) 100 MG capsule [id number] Yes Take two tablets three times a day. Briscoe Deutscher, DO Taking Active   HYDROcodone-acetaminophen (NORCO/VICODIN) 5-325 MG tablet [id number] Yes Take 1 tablet by mouth 4 (four) times daily as needed (Chronic Pain). Briscoe Deutscher, DO Taking Active   losartan-hydrochlorothiazide [facility]) 50-12.5 MG tablet [id number] Yes Take 1 tablet by mouth daily. [provider] Taking Active  Lutein 20 MG TABS [id number] Yes Take by mouth daily.  [provider] Taking Active   magnesium oxide (MAG-OX) 400 MG tablet [id number] Yes Take 400 mg by mouth daily. [provider] Taking Active Multiple Informants  metFORMIN (GLUCOPHAGE) 1000 MG tablet [id number] Yes Take 1 tablet (1,000  mg total) by mouth 2 (two) times daily with a meal. Briscoe Deutscher, DO Taking Active   metoprolol tartrate (LOPRESSOR) 25 MG tablet [id number] Yes Take 25 mg by mouth 2 (two) times daily. [provider] Taking Active         Discontinued 07/12/18 1648 (Discontinued by provider)            Med Note Blanca Friend, Royce Macadamia   Wed Jul 12, 2018  4:29 PM) Too expensive-pt not taking   omeprazole (PRILOSEC) 20 MG capsule [id number] Yes Take 20 mg by mouth daily. [provider] Taking Active   potassium chloride (K-DUR) 10 MEQ tablet [id number] Yes Two tablets twice a day Briscoe Deutscher, DO Taking Active   vitamin C (ASCORBIC ACID) 500 MG tablet [id number] Yes Take 500 mg by mouth daily. [provider] Taking Active Multiple Informants          Assessment:  Drugs sorted by system:  Neurologic/Psychologic: gabapentin  Cardiovascular: aspirin, atorvastatin, clopidogrel, lasix, losartan, metoprolol  Gastrointestinal: omeprazole  Endocrine: metformin  Pain: Norco  Vitamins/Minerals/Supplements: calcium/vitD, ferrous sulfate, potassium, vitC  Miscellaneous: allopurinol  Medication Review Findings:  . Need to clarify if patient is to be on losartan versus losartan+HCTZ . Patient is not taking Myrbetriq due to cost  Medication Assistance Findings:  Extra Help:   []  Already receiving Full Extra Help  []  Already receiving Partial Extra Help  []  Eligible based on reported income and assets  [x]  Not Eligible based on reported income and assets    PLAN: -I will f/u on 07/14/18 to confirm that new RXs were called in -Referral to SW placed to see if patient qualifies for additional services/aid in the home   [name], PharmD, [place]  [phone]

## 2018-07-12 NOTE — Addendum Note (Signed)
Addended by: Lottie Dawson D on: 07/12/2018 04:51 PM   Modules accepted: Orders

## 2018-07-13 MED ORDER — FUROSEMIDE 20 MG PO TABS: 20.0000 mg | ORAL_TABLET | Freq: Every day | ORAL | 0 refills | Status: DC

## 2018-07-13 MED ORDER — VITAMIN C 500 MG PO TABS: 500.0000 mg | ORAL_TABLET | Freq: Every day | ORAL | 0 refills | Status: DC

## 2018-07-13 MED ORDER — LOSARTAN POTASSIUM-HCTZ 50-12.5 MG PO TABS: 1.0000 | ORAL_TABLET | Freq: Every day | ORAL | 3 refills | Status: DC

## 2018-07-13 MED ORDER — GABAPENTIN 100 MG PO CAPS: ORAL_CAPSULE | ORAL | 3 refills | Status: DC

## 2018-07-13 MED ORDER — CALCIUM CITRATE-VITAMIN D 315-200 MG-UNIT PO TABS: 1.0000 | ORAL_TABLET | Freq: Every day | ORAL | 3 refills | Status: DC

## 2018-07-13 MED ORDER — LUTEIN 20 MG PO TABS: 20.0000 mg | ORAL_TABLET | Freq: Every day | ORAL | 3 refills | Status: DC

## 2018-07-13 MED ORDER — ALLOPURINOL 100 MG PO TABS: 100.0000 mg | ORAL_TABLET | Freq: Every morning | ORAL | 3 refills | Status: DC

## 2018-07-13 MED ORDER — CLOPIDOGREL BISULFATE 75 MG PO TABS: 75.0000 mg | ORAL_TABLET | Freq: Every day | ORAL | 3 refills | Status: DC

## 2018-07-13 MED ORDER — AMMONIUM LACTATE 12 % EX CREA: TOPICAL_CREAM | CUTANEOUS | 0 refills | Status: DC | PRN

## 2018-07-13 MED ORDER — FERROUS SULFATE 325 (65 FE) MG PO TBEC: 325.0000 mg | DELAYED_RELEASE_TABLET | Freq: Two times a day (BID) | ORAL | 3 refills | Status: DC

## 2018-07-13 MED ORDER — MAGNESIUM OXIDE 400 MG PO TABS: 400.0000 mg | ORAL_TABLET | Freq: Every day | ORAL | 3 refills | Status: DC

## 2018-07-13 MED ORDER — METOPROLOL TARTRATE 25 MG PO TABS: 25.0000 mg | ORAL_TABLET | Freq: Two times a day (BID) | ORAL | 2 refills | Status: DC

## 2018-07-13 MED ORDER — HYDROCODONE-ACETAMINOPHEN 5-325 MG PO TABS: 1.0000 | ORAL_TABLET | Freq: Four times a day (QID) | ORAL | 0 refills | Status: DC | PRN

## 2018-07-13 MED ORDER — POTASSIUM CHLORIDE ER 10 MEQ PO TBCR: EXTENDED_RELEASE_TABLET | ORAL | 1 refills | Status: DC

## 2018-07-13 MED ORDER — METFORMIN HCL 1000 MG PO TABS: 1000.0000 mg | ORAL_TABLET | Freq: Two times a day (BID) | ORAL | 11 refills | Status: DC

## 2018-07-13 MED ORDER — ATORVASTATIN CALCIUM 40 MG PO TABS: 40.0000 mg | ORAL_TABLET | Freq: Every day | ORAL | 3 refills | Status: DC

## 2018-07-13 MED ORDER — AMLODIPINE BESYLATE 10 MG PO TABS: 10.0000 mg | ORAL_TABLET | Freq: Every day | ORAL | 3 refills | Status: DC

## 2018-07-13 MED ORDER — ASPIRIN 81 MG PO CHEW: 81.0000 mg | CHEWABLE_TABLET | Freq: Every day | ORAL | 3 refills | Status: DC

## 2018-07-13 MED ORDER — OMEPRAZOLE 20 MG PO CPDR: 20.0000 mg | DELAYED_RELEASE_CAPSULE | Freq: Every day | ORAL | 3 refills | Status: DC

## 2018-07-13 NOTE — Addendum Note (Signed)
Addended by: Francella Solian on: 07/13/2018 01:32 PM   Modules accepted: Orders

## 2018-07-13 NOTE — Telephone Encounter (Signed)
I have ordered all script just need to be approved and sent Pharmacy up dated.

## 2018-07-14 ENCOUNTER — Ambulatory Visit: Payer: Self-pay | Admitting: Pharmacist

## 2018-07-14 ENCOUNTER — Other Ambulatory Visit: Payer: Self-pay

## 2018-07-14 NOTE — Addendum Note (Signed)
Addended by: Lottie Dawson D on: 07/14/2018 08:50 AM   Modules accepted: Orders

## 2018-07-14 NOTE — Patient Outreach (Signed)
[place] [facility]) Care Management  07/14/2018  Krista Clarke 10/24/1940 [id number]   Social work referral received on 07/13/18 requesting call to patient's daughter, Krista Clarke, who inquired about patient qualifying for in-home assistance.  Ms. Krista Clarke reported that patient could use assistance with light housekeeping and meal preparation.   BSW talked with Ms. Krista Clarke about services that are and are not covered by Medicare.  BSW explained that CNA services are covered by Medicare if in conjunction with a skilled service.  Ms. Krista Clarke said that Physical Therapy was discussed during patient's last MD appointment but they have not been contacted by a provider.  In reviewing her chart, a referral was submitted to [place] on 06/27/18.  BSW encouraged Krista Clarke to contact MD about this.  BSW informed her that CNA services will be covered by Medicare while in conjunction with PT but would have to be privately paid upon discharge of PT.  Per Krista Clarke, patient has previously applied for Medicaid but is under the income limit.  BSW offered to send her a list of in-home providers that she can contact regarding fees for CNA services.   Ms. Krista Clarke inquired about coverage for patient's life alert system.  BSW informed her that this is not typically covered by Medicare.  Patient's son is currently paying monthly fee and Krista Clarke was unsure of cost.  BSW offered to mail a list of systems that are within the $20 per month range. BSW inquired about whether or not patient has ever applied for food stamps.  She receives $15 per month.  BSW talked with her about local food pantries as a way to offset some monthly cost so that maybe they can better afford in-home assistance. BSW agreed to mail local food resources and to submit a referral on patient's behalf if necessary. BSW mailed the following: In-Home Provider list [facility] [place] [facility] will follow up next week to ensure  receipt of resources and to inquire about possible referral to pantry if they select one that requires referral.  Krista Clarke, [place] Home Worker [phone]

## 2018-07-19 ENCOUNTER — Telehealth: Payer: Self-pay | Admitting: Family Medicine

## 2018-07-19 NOTE — Telephone Encounter (Signed)
See note

## 2018-07-19 NOTE — Telephone Encounter (Signed)
Can you send to new pharmacy

## 2018-07-19 NOTE — Telephone Encounter (Signed)
Copied from [place] [phone]. Topic: General - Other >> Jul 19, 2018  2:21 PM Lennox Solders wrote: Reason for CRM: pt daughter sharon rice is calling due to cost she no longer wants hydrocodone to go to [facility]. Please sent rx to [place]. Pt daughter did not pick up hydrocodone from [facility]

## 2018-07-20 MED ORDER — HYDROCODONE-ACETAMINOPHEN 5-325 MG PO TABS: 1.0000 | ORAL_TABLET | Freq: Four times a day (QID) | ORAL | 0 refills | Status: DC | PRN

## 2018-07-20 NOTE — Telephone Encounter (Signed)
Sent. Make sure to cancel the others.

## 2018-07-20 NOTE — Telephone Encounter (Signed)
[facility] [employer] and spoke to [place] and told her to cancel Rx for Hydrocodone per Dr. Juleen China. Leda Gauze verbalized understanding.

## 2018-07-21 ENCOUNTER — Other Ambulatory Visit: Payer: Self-pay

## 2018-07-21 NOTE — Patient Outreach (Signed)
[place]) Care Management  07/21/2018  MYLIAH MEDEL April 10, 1941 [id number]   Follow up call to patient's daughter, [name], to ensure receipt of resources mailed on 07/14/18.  Ms. Benjamine Mola confirmed receipt.  BSW offered to submit referral to the food pantry at [place] [facility]; Ms. Benjamine Mola agreed to this referral.  Referral was submitted; this referral is good for six visits to the pantry.   Ms. Benjamine Mola inquired about government funds for patient's that do not qualify for Medicaid and need in-home assistance but cannot privately pay.  BSW informed her that [place] has an in-home aide program but the wait list is typically one year or longer.  BSW provided her with contact information to submit referral.  BSW encouraged her to go to DSS to further discuss options/Medicaid eligibility with a caseworker. Ms. Benjamine Mola agreed that it would be helpful to further explore.   BSW is closing case at this time but encouraged Ms. Rice to call if any additional social work need arise.   Ronn Melena, BSW Social Worker [phone]

## 2018-07-24 ENCOUNTER — Ambulatory Visit: Payer: Self-pay

## 2018-07-24 ENCOUNTER — Other Ambulatory Visit: Payer: Self-pay | Admitting: Pharmacist

## 2018-07-24 ENCOUNTER — Other Ambulatory Visit: Payer: Self-pay | Admitting: Family Medicine

## 2018-07-24 NOTE — Patient Outreach (Signed)
[facility] [facility]) Care Management  07/24/2018  POSIE LILLIBRIDGE [birth year] [id number]  Successful outreach call to patient's PCP regarding losartan. Patient was recently taken off losartan/hctz and placed on losartan 50mg .  New RX was called into [facility] for losartan 50mg  once daily and will be delivered to patient today.  Pill packs to be corrected.  Daughter removed all combo pills from packs.  New pill packs will be delivered next week.  Patients 1st set of pill packs will last until then.  PLAN: -I will f/u with patient within 2 weeks to ensure pill packs are going as planned  [name], PharmD, [place]  [phone]

## 2018-07-24 NOTE — Telephone Encounter (Signed)
She states that the pt states that she is on this medicine but the losartan /hctz was sent into the pharmacy pt has been out of medicine the whole weekend per Almyra Free the pharmacist. [place], [place] - [address] [phone] (Phone) [phone] (Fax)  Reason for Disposition . Caller requesting a refill, no triage required, and triager able to refill per unit policy  Protocols used: MEDICATION QUESTION CALL-A-AH  Request sent to [facility] triage pool. Medication is a historical medication Briscoe Deutscher, DO  to Durwin Glaze, CMA . Francella Solian, CMA      05/22/18 11:54 AM  Note    Plain Losartan. We took away all of the HCTZ at the last visit. Should be in the scribed notes.    Med was listed as a historical medication. Sent back to provider to review.

## 2018-07-24 NOTE — Telephone Encounter (Signed)
Requested medication (s) are due for refill today: yes  Requested medication (s) are on the active medication list: yes  Last refill:  07/14/18  Future visit scheduled: yes  Notes to clinic:  Medication is listed as a historical medication- pt is asking for losartan/HCTZ but per Louie Boston, DO  to Durwin Glaze, [place] . Francella Solian, CMA      05/22/18 11:54 AM  Note    Plain Losartan. We took away all of the HCTZ at the last visit. Should be in the scribed notes.       Requested Prescriptions  Pending Prescriptions Disp Refills   losartan (COZAAR) 50 MG tablet      Sig: Take 1 tablet (50 mg total) by mouth daily.     Cardiovascular:  Angiotensin Receptor Blockers Passed - 07/24/2018  8:45 AM      Passed - Cr in normal range and within 180 days    Creatinine, Ser  Date Value Ref Range Status  02/08/2018 0.98 0.40 - 1.20 mg/dL Final         Passed - K in normal range and within 180 days    Potassium  Date Value Ref Range Status  02/08/2018 4.2 3.5 - 5.1 mEq/L Final         Passed - Patient is not pregnant      Passed - Last BP in normal range    BP Readings from Last 1 Encounters:  06/27/18 136/68         Passed - Valid encounter within last 6 months    Recent Outpatient Visits          3 weeks ago Routine physical examination   [place] Wallace, [place], DO   2 months ago Microcytic anemia   [place] Wallace, [place], DO   5 months ago Hypertension associated with diabetes [facility])   [place] Wallace, [place], [place]   7 months ago Bilateral lower extremity edema   [place] Wallace, [place], DO   8 months ago Hypertension associated with diabetes [facility])   [place] Wallace, Danae Chen, DO      Future Appointments            In 2 months Briscoe Deutscher, [place], [facility]

## 2018-07-24 NOTE — Telephone Encounter (Signed)
[place], [place] [phone] (Phone) [phone] (Fax) She states that the pt states that she is on this medicine but the losartan Jonita Albee was sent into the pharmacy pt has been out of medicine the whole weekend per Almyra Free the pharmacist. [place], [place] - [address] [phone] (Phone) [phone] (Fax)  Briscoe Deutscher, DO  to Durwin Glaze, [place] . Francella Solian, CMA      05/22/18 11:54 AM  Note    Plain Losartan. We took away all of the HCTZ at the last visit. Should be in the scribed notes.

## 2018-07-25 IMAGING — MG 2D DIGITAL SCREENING BILATERAL MAMMOGRAM WITH 3D TOMO WITH CAD
8 of 12 series · 8 of 28 positions shown · non-contrast
Comparison: Previous exam(s).

CLINICAL DATA: Screening.

EXAM:
2D DIGITAL SCREENING BILATERAL MAMMOGRAM WITH 3D TOMO WITH CAD

[R MLO synth-2D]
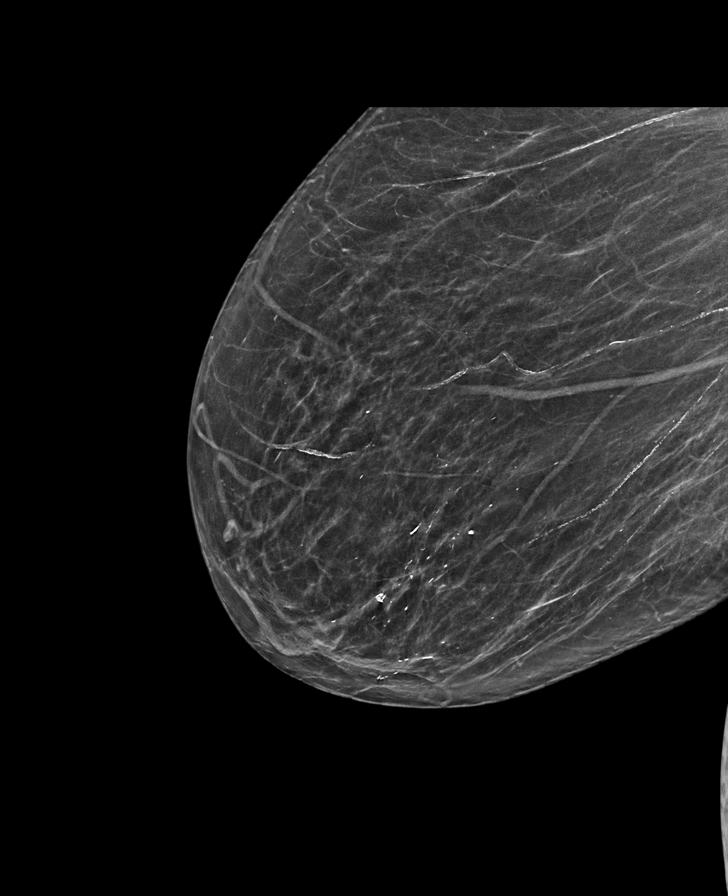

[L MLO synth-2D]
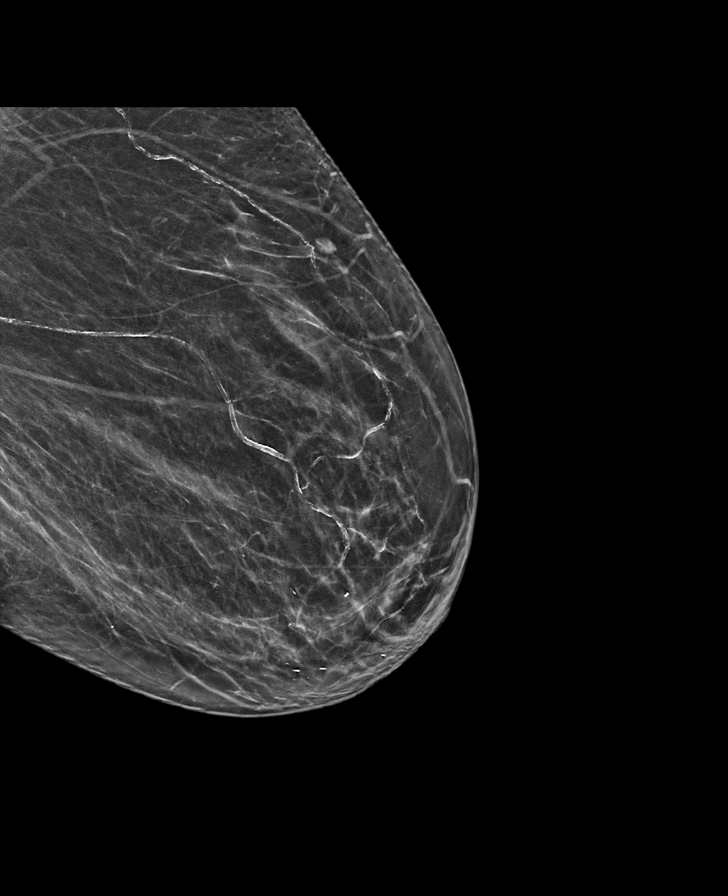

[L MLO]
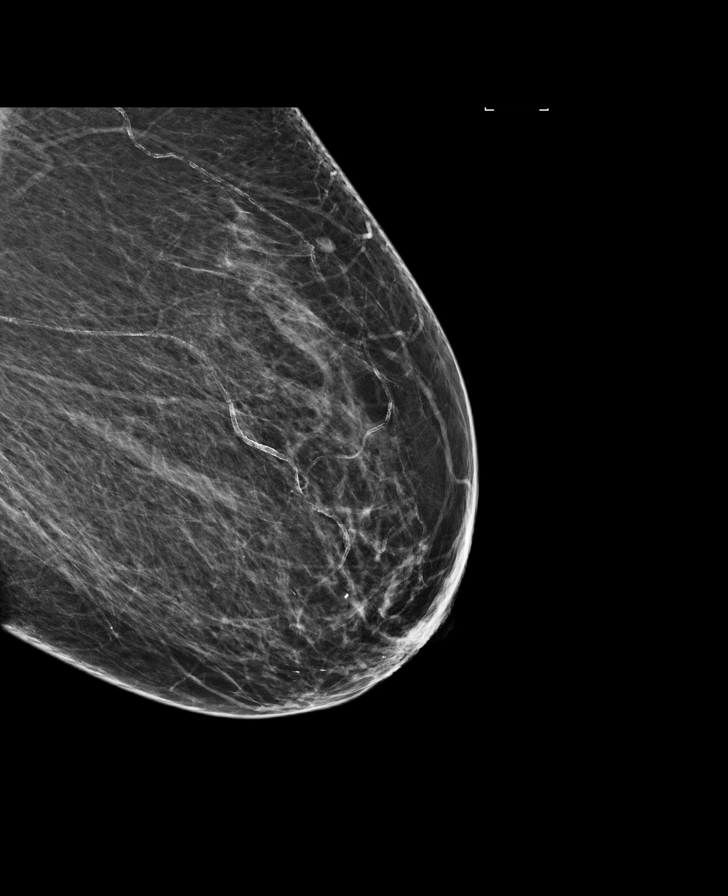

[R CC]
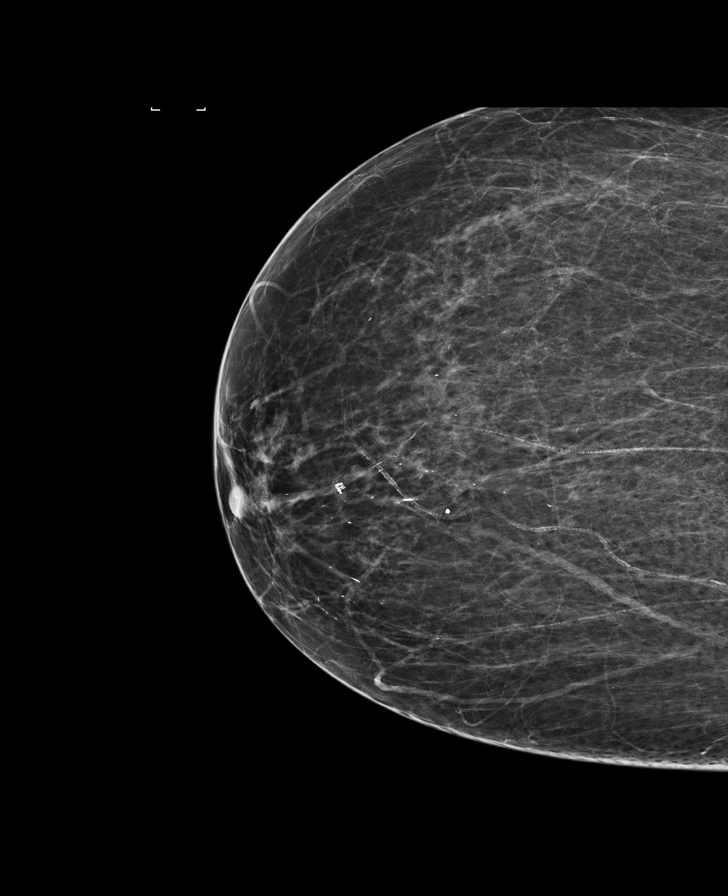

[R CC synth-2D]
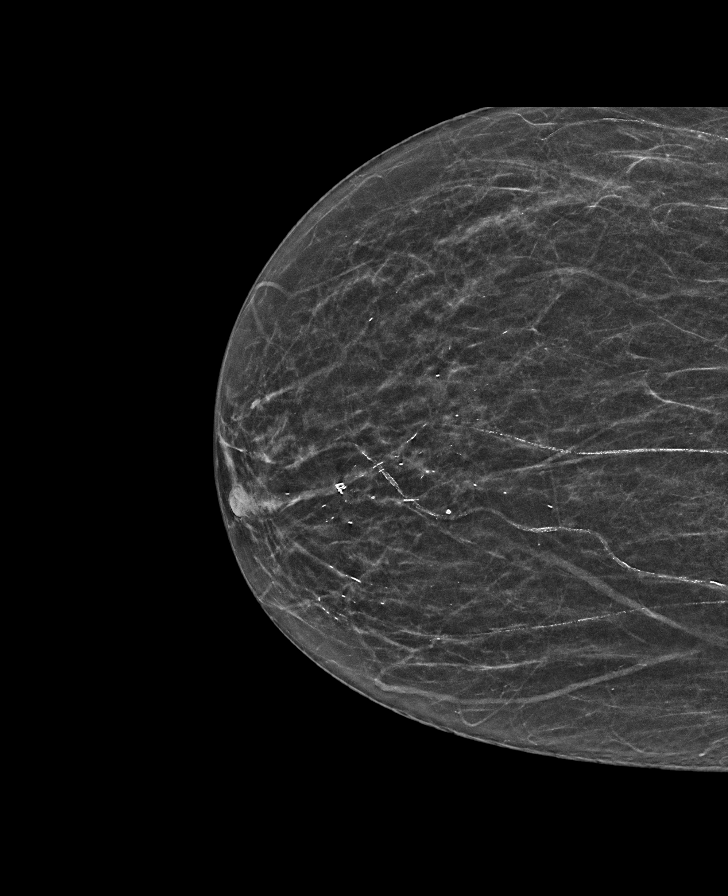

[R MLO]
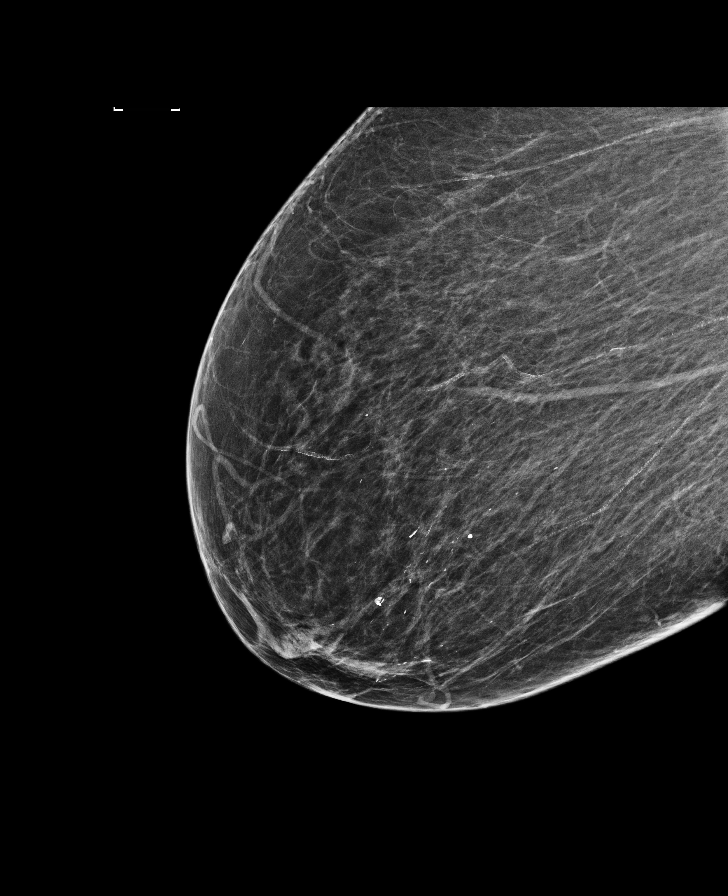

[L CC synth-2D]
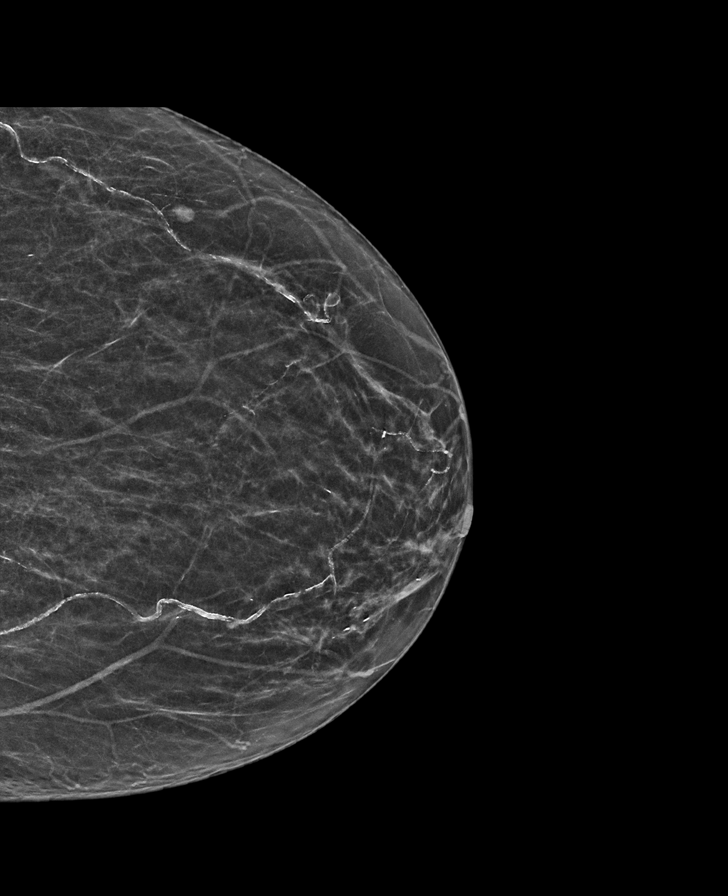

[L CC]
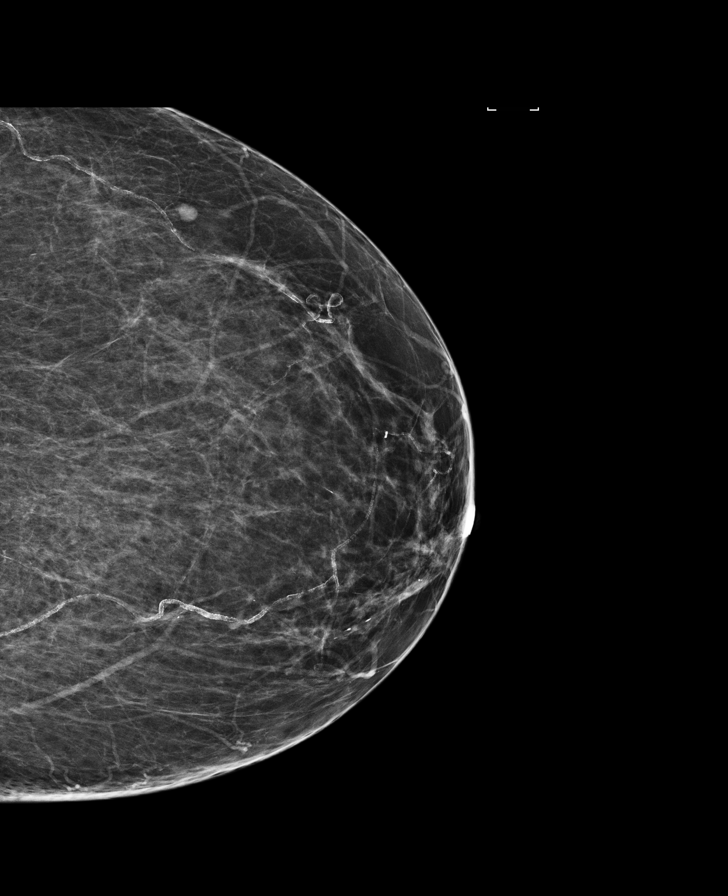

[8 of 28 positions shown; findings below may reference images not displayed]

ACR Breast Density Category b: There are scattered areas of
fibroglandular density.
FINDINGS: There are no findings suspicious for malignancy. Images were
processed with CAD.
IMPRESSION: No mammographic evidence of malignancy. A result letter of this
screening mammogram will be mailed directly to the patient.

RECOMMENDATION:
Screening mammogram in one year. (Code:[id number]-ZS0)

BI-RADS CATEGORY  1: Negative.

## 2018-07-25 NOTE — Telephone Encounter (Signed)
Not sure what one we need to fill?

## 2018-07-28 MED ORDER — LOSARTAN POTASSIUM 50 MG PO TABS: 50.0000 mg | ORAL_TABLET | Freq: Every day | ORAL | 3 refills | Status: DC

## 2018-07-28 NOTE — Addendum Note (Signed)
Addended by: Briscoe Deutscher R on: 07/28/2018 01:48 PM   Modules accepted: Orders

## 2018-07-28 NOTE — Telephone Encounter (Signed)
Losartan sent to pharmacy. 07/28/2018

## 2018-08-02 ENCOUNTER — Ambulatory Visit: Payer: Medicare Other | Admitting: Podiatry

## 2018-08-02 ENCOUNTER — Encounter: Payer: Self-pay | Admitting: Podiatry

## 2018-08-02 DIAGNOSIS — M79674 Pain in right toe(s): Secondary | ICD-10-CM | POA: Diagnosis not present

## 2018-08-02 DIAGNOSIS — B351 Tinea unguium: Secondary | ICD-10-CM

## 2018-08-02 DIAGNOSIS — E1142 Type 2 diabetes mellitus with diabetic polyneuropathy: Secondary | ICD-10-CM

## 2018-08-02 DIAGNOSIS — D689 Coagulation defect, unspecified: Secondary | ICD-10-CM

## 2018-08-02 DIAGNOSIS — M79675 Pain in left toe(s): Secondary | ICD-10-CM

## 2018-08-02 NOTE — Progress Notes (Signed)
Complaint:  Visit Type: Patient returns to my office for continued preventative foot care services. Complaint: Patient states" my nails have grown long and thick and become painful to walk and wear shoes" Patient has been diagnosed with DM with neuropathy.. The patient presents for preventative foot care services. No changes to ROS  Podiatric Exam: Vascular: dorsalis pedis and posterior tibial pulses are palpable bilateral. Capillary return is immediate. Temperature gradient is WNL. Skin turgor WNL  Sensorium: Absent  Semmes Weinstein monofilament test. Normal tactile sensation bilaterally. Nail Exam: Pt has thick disfigured discolored nails with subungual debris noted bilateral entire nail hallux through fifth toenails.  Right hallux nail has subungual hematoma. Ulcer Exam: There is no evidence of ulcer or pre-ulcerative changes or infection. Orthopedic Exam: Muscle tone and strength are WNL. No limitations in general ROM. No crepitus or effusions noted. Foot type and digits show no abnormalities. Bony prominences are unremarkable. Skin: No Porokeratosis. No infection or ulcers  Diagnosis:  Onychomycosis, , Pain in right toe, pain in left toes  Treatment & Plan Procedures and Treatment: Consent by patient was obtained for treatment procedures.   Debridement of mycotic and hypertrophic toenails, 1 through 5 bilateral and clearing of subungual debris. No ulceration, no infection noted.  Return Visit-Office Procedure: Patient instructed to return to the office for a follow up visit 3 months for continued evaluation and treatment.    Gardiner Barefoot DPM

## 2018-08-03 ENCOUNTER — Other Ambulatory Visit: Payer: Self-pay | Admitting: Pharmacist

## 2018-08-03 ENCOUNTER — Ambulatory Visit: Payer: Self-pay | Admitting: Pharmacist

## 2018-08-03 NOTE — Patient Outreach (Addendum)
[facility]) Care Management  08/03/2018  Krista Clarke 04-Oct-1940 [id number]  Reason for referral: medication management  HPI:  Successful outreach to Ms. Krista Clarke today regarding compliance packaging.  HIPAA identifiers verified x2.  Patient states she is doing well today.  She states that her compliance packs are going "okay", however she still needs time to adjust and decide if she will continue to use them.  Patient is using [facility] for her compliance packs and this will be week 3.  [place] to ensure compliance packages were delivered this week.  No changes to medications at this time.  Medication list remains updated in [facility].  Patient provided with [place] contact information if assistance needed.    Thank you for allowing [facility] pharmacy to be involved in this patient's care.    PLAN: -I will follow up within 2 weeks to check in with patient regarding compliance packs.  [name], PharmD, [place]  [phone]

## 2018-08-07 ENCOUNTER — Other Ambulatory Visit: Payer: Self-pay

## 2018-08-10 ENCOUNTER — Encounter: Payer: Self-pay | Admitting: Family Medicine

## 2018-08-10 ENCOUNTER — Other Ambulatory Visit: Payer: Self-pay | Admitting: Pharmacist

## 2018-08-10 ENCOUNTER — Ambulatory Visit (INDEPENDENT_AMBULATORY_CARE_PROVIDER_SITE_OTHER): Payer: Medicare Other | Admitting: Family Medicine

## 2018-08-10 VITALS — BP 178/72 | HR 72

## 2018-08-10 DIAGNOSIS — R32 Unspecified urinary incontinence: Secondary | ICD-10-CM

## 2018-08-10 NOTE — Progress Notes (Signed)
   Subjective:    Patient ID: Krista Clarke is a 77 y.o. female presenting with Pressary Insertion  on 08/10/2018  HPI: Here today for pessary placement. Previously had #2 ring with knob, but it fell out. Attempted to place the ordered pessary and it was too uncomfortable and she kept expelling it to where the know was easily visible at the introitus and would not stay behind her pubic bone. She is a P5. Attempted to place several other sizes and varieties of pessaries, up to size #6. She could not tolerate this. We attempted placement of Gelhorn which stayed up better, but the knob hung out. Could not tolerate doughnut. In the end given a #2 flat ring, which she wanted to take home, though she was still aware of it. She does report, that she did not feel it when Dr. Harolyn Rutherford placed it prior.  Review of Systems  Constitutional: Negative for chills and fever.  Respiratory: Negative for shortness of breath.   Cardiovascular: Negative for chest pain.  Gastrointestinal: Negative for abdominal pain, nausea and vomiting.  Genitourinary: Negative for dysuria.  Skin: Negative for rash.      Objective:    BP (!) 178/72   Pulse 72  Physical Exam Constitutional:      General: She is not in acute distress.    Appearance: She is well-developed.  HENT:     Head: Normocephalic and atraumatic.  Eyes:     General: No scleral icterus. Neck:     Musculoskeletal: Neck supple.  Cardiovascular:     Rate and Rhythm: Normal rate.  Pulmonary:     Effort: Pulmonary effort is normal.  Abdominal:     Palpations: Abdomen is soft.  Genitourinary:    General: Normal vulva.     Comments: Anterior cystocele Skin:    General: Skin is warm and dry.  Neurological:     Mental Status: She is alert and oriented to person, place, and time.       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 Return on Monday if not tolerating. Otherwise return in 3 wks.         Total face-to-face time with patient: 25 minutes. Over 50% of encounter was spent on counseling and coordination of care. Return in about 4 days (around 08/14/2018).  Donnamae Jude 08/10/2018 4:48 PM

## 2018-08-10 NOTE — Assessment & Plan Note (Signed)
Will attempt this pessary. Return on Monday if not tolerating. Otherwise return in 3 wks.

## 2018-08-10 NOTE — Patient Outreach (Addendum)
[facility] [facility]) Care Management  08/10/2018  KENEDIE DIROCCO 1940-11-19 [id number]   Incoming call from patient's daughter [name]) with HIPAA identifiers verified. She is very upset and states that her mom is out of medication.  Per daughter, the delivery has not been consistent with [facility]. Spoke with [employer] and they were on the way to deliver medications. Confirmed that medications were delivered  Daughter states that she will follow up with me to determine if & when they want to switch pharmacies.  She states she would like her mom (the patient) to make that decision.  They have medications to last the remainder of this year with refills remaining at [facility]  Patient will continue to receive compliance packs via [facility] until instructed to do otherwise.  Caregiver verbalizes understanding.  PLAN: -I will follow up with caregiver in 2 weeks to see if adjustments should be made.  [name], PharmD, [place]  [phone]

## 2018-08-14 ENCOUNTER — Ambulatory Visit: Payer: Self-pay | Admitting: Pharmacist

## 2018-08-14 ENCOUNTER — Ambulatory Visit: Payer: Medicare Other | Admitting: Family Medicine

## 2018-08-15 DIAGNOSIS — Z950 Presence of cardiac pacemaker: Secondary | ICD-10-CM | POA: Diagnosis not present

## 2018-08-15 DIAGNOSIS — Z45018 Encounter for adjustment and management of other part of cardiac pacemaker: Secondary | ICD-10-CM | POA: Diagnosis not present

## 2018-08-16 ENCOUNTER — Telehealth: Payer: Self-pay | Admitting: Family Medicine

## 2018-08-16 NOTE — Telephone Encounter (Signed)
See note  Copied from [place] [id number]. Topic: General - Other >> Aug 16, 2018  3:17 PM Oneta Rack wrote: Osvaldo Human name: Vaughan Basta  Relation to pt: [employer]  Call back number: [phone] fax # [phone]    Reason for call:  Detailed written order for diabetic supplies and blood glucose form  fax to [phone]. Please ensure question #9 is filled out as per representativethe original form was missing #9.

## 2018-08-17 ENCOUNTER — Other Ambulatory Visit: Payer: Self-pay | Admitting: Family Medicine

## 2018-08-17 NOTE — Telephone Encounter (Signed)
Requested medication (s) are due for refill today: yes  Requested medication (s) are on the active medication list: yes  Last refill:  07/20/18 for 120 tabs  Future visit scheduled: yes  Notes to clinic:  Med can not be delegated.  Controlled medication. Need urine drug screen.  Requested Prescriptions  Pending Prescriptions Disp Refills   HYDROcodone-acetaminophen (NORCO/VICODIN) 5-325 MG tablet 120 tablet 0    Sig: Take 1 tablet by mouth 4 (four) times daily as needed (Chronic Pain).     Not Delegated - Analgesics:  Opioid Agonist Combinations Failed - 08/17/2018  9:32 AM      Failed - This refill cannot be delegated      Failed - Urine Drug Screen completed in last 360 days.      Passed - Valid encounter within last 6 months    Recent Outpatient Visits          1 month ago Routine physical examination   [place] Wallace, [place], DO   3 months ago Microcytic anemia   [place] Wallace, [place], DO   6 months ago Hypertension associated with diabetes [facility])   [place] Wallace, [place], DO   8 months ago Bilateral lower extremity edema   [facility] [place], [place], DO   9 months ago Hypertension associated with diabetes [facility])   [place] Wallace, Danae Chen, DO      Future Appointments            In 1 month Briscoe Deutscher, [place], [facility]

## 2018-08-17 NOTE — Telephone Encounter (Signed)
See note

## 2018-08-17 NOTE — Telephone Encounter (Signed)
Ok to fill 

## 2018-08-17 NOTE — Telephone Encounter (Signed)
ppw completed and re faxed.

## 2018-08-17 NOTE — Telephone Encounter (Signed)
Copied from [place] [id number]. Topic: Quick Communication - Rx Refill/Question >> Aug 17, 2018  9:26 AM Ahmed Prima L wrote: Medication: HYDROcodone-acetaminophen (NORCO/VICODIN) 5-325 MG tablet  Has the patient contacted their pharmacy? No, she said she has to call office for this  (Agent: If no, request that the patient contact the pharmacy for the refill.) (Agent: If yes, when and what did the pharmacy advise? Preferred Pharmacy (with phone number or street name): [place], [place] - [address] [place] [address]    Agent: Please be advised that RX refills may take up to 3 business days. We ask that you follow-up with your pharmacy.

## 2018-08-18 MED ORDER — HYDROCODONE-ACETAMINOPHEN 5-325 MG PO TABS: 1.0000 | ORAL_TABLET | Freq: Four times a day (QID) | ORAL | 0 refills | Status: DC | PRN

## 2018-08-21 ENCOUNTER — Ambulatory Visit: Payer: Self-pay | Admitting: Pharmacist

## 2018-08-21 ENCOUNTER — Other Ambulatory Visit: Payer: Self-pay | Admitting: Pharmacist

## 2018-08-21 NOTE — Patient Outreach (Signed)
[facility] [facility]) Care Management  08/21/2018  Krista Clarke [birth year] [id number]  Successful outreach to patient's daughter who states that her mom is continuing use the compliance packs from [employer]. Daughter [name]) states that her mom would like her business switched to [facility] [employer] [facility] on [address].  I have transferred RXs to [place] for next month's fill.  [facility] has patient's information and will deliver her packs when next fill is eligible.  Daughter is appreciative of [facility] efforts.  I will continue to follow patient until transition to [facility] is achieved.   PLAN: -I will follow up with patient and daughter next week.   [name], PharmD, [place]  [phone]

## 2018-08-25 ENCOUNTER — Other Ambulatory Visit: Payer: Self-pay | Admitting: Family Medicine

## 2018-08-25 DIAGNOSIS — E1159 Type 2 diabetes mellitus with other circulatory complications: Secondary | ICD-10-CM

## 2018-08-25 DIAGNOSIS — R600 Localized edema: Secondary | ICD-10-CM

## 2018-08-25 DIAGNOSIS — I10 Essential (primary) hypertension: Principal | ICD-10-CM

## 2018-08-25 NOTE — Telephone Encounter (Signed)
Copied from [place] [id number]. Topic: General - Other >> Aug 25, 2018  3:20 PM Lennox Solders wrote: Reason for [id number] pharm is calling and they need new rxs  metformin 1000 mg, potassium chloride 10 meq, vit C 500 mg, magnesium oxide 400 mg , Omeprazole 20 mg, metoprolol tartrate 25 mg, amlodipine 10 mg, ASA 81 mg chewable tablets, ammonium lactate 12%cream, clopidogrel 75 mg, furosemide 20 mg, gabapentin 100 mg, lutein 20 mg, atorvastatin 40 mg , ferrous sulfate 325 mg, losartan 50 mg, allopurinol 100 mg. Hydrocodone 5-325 mg

## 2018-08-28 ENCOUNTER — Telehealth: Payer: Self-pay | Admitting: Radiology

## 2018-08-28 ENCOUNTER — Telehealth: Payer: Self-pay | Admitting: Family Medicine

## 2018-08-28 ENCOUNTER — Other Ambulatory Visit: Payer: Self-pay | Admitting: Pharmacist

## 2018-08-28 DIAGNOSIS — R600 Localized edema: Secondary | ICD-10-CM

## 2018-08-28 DIAGNOSIS — E1159 Type 2 diabetes mellitus with other circulatory complications: Secondary | ICD-10-CM

## 2018-08-28 DIAGNOSIS — I10 Essential (primary) hypertension: Principal | ICD-10-CM

## 2018-08-28 MED ORDER — FERROUS SULFATE 325 (65 FE) MG PO TBEC: 325.0000 mg | DELAYED_RELEASE_TABLET | Freq: Two times a day (BID) | ORAL | 3 refills | Status: DC

## 2018-08-28 MED ORDER — ALLOPURINOL 100 MG PO TABS: 100.0000 mg | ORAL_TABLET | Freq: Every morning | ORAL | 1 refills | Status: DC

## 2018-08-28 MED ORDER — AMLODIPINE BESYLATE 10 MG PO TABS: 10.0000 mg | ORAL_TABLET | Freq: Every day | ORAL | 1 refills | Status: DC

## 2018-08-28 MED ORDER — VITAMIN C 500 MG PO TABS: 500.0000 mg | ORAL_TABLET | Freq: Every day | ORAL | 0 refills | Status: DC

## 2018-08-28 MED ORDER — CLOPIDOGREL BISULFATE 75 MG PO TABS: 75.0000 mg | ORAL_TABLET | Freq: Every day | ORAL | 3 refills | Status: DC

## 2018-08-28 MED ORDER — GABAPENTIN 100 MG PO CAPS: ORAL_CAPSULE | ORAL | 3 refills | Status: DC

## 2018-08-28 MED ORDER — ASPIRIN 81 MG PO CHEW: 81.0000 mg | CHEWABLE_TABLET | Freq: Every day | ORAL | 3 refills | Status: DC

## 2018-08-28 MED ORDER — METOPROLOL TARTRATE 25 MG PO TABS: 25.0000 mg | ORAL_TABLET | Freq: Two times a day (BID) | ORAL | 2 refills | Status: DC

## 2018-08-28 MED ORDER — CALCIUM CITRATE-VITAMIN D 315-200 MG-UNIT PO TABS: 1.0000 | ORAL_TABLET | Freq: Every day | ORAL | 3 refills | Status: DC

## 2018-08-28 MED ORDER — LUTEIN 20 MG PO TABS: 20.0000 mg | ORAL_TABLET | Freq: Every day | ORAL | 3 refills | Status: DC

## 2018-08-28 MED ORDER — FUROSEMIDE 20 MG PO TABS: 20.0000 mg | ORAL_TABLET | Freq: Every day | ORAL | 0 refills | Status: DC

## 2018-08-28 MED ORDER — POTASSIUM CHLORIDE ER 10 MEQ PO TBCR: EXTENDED_RELEASE_TABLET | ORAL | 1 refills | Status: DC

## 2018-08-28 MED ORDER — ATORVASTATIN CALCIUM 40 MG PO TABS: 40.0000 mg | ORAL_TABLET | Freq: Every day | ORAL | 3 refills | Status: DC

## 2018-08-28 MED ORDER — LOSARTAN POTASSIUM 50 MG PO TABS: 50.0000 mg | ORAL_TABLET | Freq: Every day | ORAL | 3 refills | Status: DC

## 2018-08-28 MED ORDER — MAGNESIUM OXIDE 400 MG PO TABS: 400.0000 mg | ORAL_TABLET | Freq: Every day | ORAL | 3 refills | Status: DC

## 2018-08-28 MED ORDER — METFORMIN HCL 1000 MG PO TABS: 1000.0000 mg | ORAL_TABLET | Freq: Two times a day (BID) | ORAL | 11 refills | Status: DC

## 2018-08-28 MED ORDER — OMEPRAZOLE 20 MG PO CPDR: 20.0000 mg | DELAYED_RELEASE_CAPSULE | Freq: Every day | ORAL | 3 refills | Status: DC

## 2018-08-28 MED ORDER — HYDROCODONE-ACETAMINOPHEN 5-325 MG PO TABS: 1.0000 | ORAL_TABLET | Freq: Four times a day (QID) | ORAL | 0 refills | Status: DC | PRN

## 2018-08-28 MED ORDER — AMMONIUM LACTATE 12 % EX CREA: TOPICAL_CREAM | CUTANEOUS | 0 refills | Status: DC | PRN

## 2018-08-28 NOTE — Telephone Encounter (Signed)
Patient called to cancel appointment for pessary check, she states that she does not want to reschedule at this point. She stated that she didn't feel that the pessary is working for her. Patient states that she will speak with daughter to talk about coming in for MD visit for possible other options.

## 2018-08-28 NOTE — Telephone Encounter (Signed)
[facility] for questions concerning refills 1. Only 20 of the  furosemide (LASIX) 20 MG tablet sent. Would like to know if this was supposed to be 90?  2. Would like you to send the calcium citrate-vitamin D (CALCIUM CITRATE + D) 315-200 MG-UNIT tablet  K[facility]a [facility] states [facility] does not like to transfer scripts and easier if you can resend to them.   3. Should they expect the HYDROcodone-acetaminophen (NORCO/VICODIN) 5-325 MG tablet?  They can receive electronically if you can send

## 2018-08-28 NOTE — Patient Outreach (Signed)
[facility]) Care Management  08/28/2018  Krista Clarke 03/08/1941 [id number]  Incoming call from Krista Clarke (patient's daughter) to check in on transfer of compliance packaging to [employer].  Pharmacy information was given to Krista Clarke if issues arise.  [facility] Pharmacist called to transfer all medications from [place] to [facility].  Insurance information, allergies and current list of medications provided to Krista Clarke) at [employer] [facility].   PLAN: -Will need to have new RX for Norco called in for next month -Will f/u with daughter and patient next week to ensure delivery and transition of compliance packs  Krista Clarke, PharmD, [name]  [phone]

## 2018-08-28 NOTE — Telephone Encounter (Signed)
Pt requesting change of pharmacy all medications that pt has been forwarded. Hydrocodone-acetaminophen 5-325 mg inadvertently printed at office. Called Kara at office and informed of accidental approval and asked to find the printed prescription and destroy it. Sending this request to office  transfer this prescription to new pharmacy.

## 2018-08-28 NOTE — Telephone Encounter (Signed)
See note

## 2018-08-28 NOTE — Addendum Note (Signed)
Addended by: Gwenyth Ober R on: 08/28/2018 10:35 AM   Modules accepted: Orders

## 2018-08-29 ENCOUNTER — Telehealth: Payer: Self-pay | Admitting: Family Medicine

## 2018-08-29 NOTE — Telephone Encounter (Signed)
All were called in yesterday

## 2018-08-29 NOTE — Telephone Encounter (Signed)
Left message for pt's daughter notifying her that medications were sent to requested pharmacy, [place] on yesterday.

## 2018-08-29 NOTE — Telephone Encounter (Signed)
Copied from [place] [id number]. Topic: Quick Communication - See Telephone Encounter >> Aug 29, 2018 10:19 AM Antonieta Iba C wrote: CRM for notification. See Telephone encounter for: 08/29/18.  Pt's daughter called in. Prescriptions that were called in yesterday should have went to the pharmacy below instead. Please assist.    Pharmacy: [facility], [place] - [place], [place] - [address] [phone] (Phone) [phone] (Fax)

## 2018-08-31 ENCOUNTER — Other Ambulatory Visit: Payer: Self-pay | Admitting: Family Medicine

## 2018-08-31 ENCOUNTER — Telehealth: Payer: Self-pay | Admitting: Family Medicine

## 2018-08-31 DIAGNOSIS — R600 Localized edema: Secondary | ICD-10-CM

## 2018-08-31 NOTE — Telephone Encounter (Signed)
Copied from [place] [id number]. Topic: Quick Communication - Home Health Verbal Orders >> Aug 31, 2018  4:18 PM Margot Ables wrote: Caller/Agency: Beth RN with [facility] Number: [phone], secure VM if not available Requesting OT/PT/Skilled Nursing/Social Work: PT Frequency: Eustaquio Maize has been trying to contact pt to schedule HH PT eval visit - no success in reaching pt on the 3 phone #s we have. Notifying provider of delay in start of care.

## 2018-09-01 NOTE — Telephone Encounter (Signed)
Spoke to pt and she stated that her furnace has been out so she hasn't been able to answer any calls. Pt stated that it was okay still for PT. Call Beth and informed her of update and she stated the scheduler from [facility] was able to get a hold of pt.  Pt is set up for PT for Monday 09/04/2018.

## 2018-09-04 DIAGNOSIS — E1169 Type 2 diabetes mellitus with other specified complication: Secondary | ICD-10-CM | POA: Diagnosis not present

## 2018-09-04 DIAGNOSIS — R42 Dizziness and giddiness: Secondary | ICD-10-CM | POA: Diagnosis not present

## 2018-09-04 DIAGNOSIS — G8929 Other chronic pain: Secondary | ICD-10-CM | POA: Diagnosis not present

## 2018-09-04 DIAGNOSIS — Z7984 Long term (current) use of oral hypoglycemic drugs: Secondary | ICD-10-CM | POA: Diagnosis not present

## 2018-09-04 DIAGNOSIS — Z9181 History of falling: Secondary | ICD-10-CM | POA: Diagnosis not present

## 2018-09-04 DIAGNOSIS — L4050 Arthropathic psoriasis, unspecified: Secondary | ICD-10-CM | POA: Diagnosis not present

## 2018-09-04 DIAGNOSIS — Z7982 Long term (current) use of aspirin: Secondary | ICD-10-CM | POA: Diagnosis not present

## 2018-09-04 DIAGNOSIS — I442 Atrioventricular block, complete: Secondary | ICD-10-CM | POA: Diagnosis not present

## 2018-09-04 DIAGNOSIS — E782 Mixed hyperlipidemia: Secondary | ICD-10-CM | POA: Diagnosis not present

## 2018-09-04 DIAGNOSIS — Z950 Presence of cardiac pacemaker: Secondary | ICD-10-CM | POA: Diagnosis not present

## 2018-09-04 DIAGNOSIS — M5442 Lumbago with sciatica, left side: Secondary | ICD-10-CM | POA: Diagnosis not present

## 2018-09-04 DIAGNOSIS — E1151 Type 2 diabetes mellitus with diabetic peripheral angiopathy without gangrene: Secondary | ICD-10-CM | POA: Diagnosis not present

## 2018-09-04 DIAGNOSIS — D649 Anemia, unspecified: Secondary | ICD-10-CM | POA: Diagnosis not present

## 2018-09-04 DIAGNOSIS — M5441 Lumbago with sciatica, right side: Secondary | ICD-10-CM | POA: Diagnosis not present

## 2018-09-04 DIAGNOSIS — R202 Paresthesia of skin: Secondary | ICD-10-CM | POA: Diagnosis not present

## 2018-09-05 ENCOUNTER — Telehealth: Payer: Self-pay | Admitting: Family Medicine

## 2018-09-05 NOTE — Telephone Encounter (Signed)
See note

## 2018-09-05 NOTE — Telephone Encounter (Signed)
Copied from [place] [id number]. Topic: Quick Communication - Home Health Verbal Orders >> Sep 05, 2018  9:01 AM Jodie Echevaria wrote: Caller/Agency: [facility] Callback Number: [phone] ok to LM Requesting OT/PT/Skilled Nursing/Social Work: PT Frequency: Twice a week for 5 weeks, for strengthening safe transfers, gait balance training, home exercise program, BPPV treatment and home safety

## 2018-09-06 NOTE — Telephone Encounter (Signed)
Tried to call would not let me leave voice mail.

## 2018-09-07 DIAGNOSIS — E1169 Type 2 diabetes mellitus with other specified complication: Secondary | ICD-10-CM | POA: Diagnosis not present

## 2018-09-07 DIAGNOSIS — D649 Anemia, unspecified: Secondary | ICD-10-CM | POA: Diagnosis not present

## 2018-09-07 DIAGNOSIS — Z950 Presence of cardiac pacemaker: Secondary | ICD-10-CM | POA: Diagnosis not present

## 2018-09-07 DIAGNOSIS — R42 Dizziness and giddiness: Secondary | ICD-10-CM | POA: Diagnosis not present

## 2018-09-07 DIAGNOSIS — M5441 Lumbago with sciatica, right side: Secondary | ICD-10-CM | POA: Diagnosis not present

## 2018-09-07 DIAGNOSIS — Z7984 Long term (current) use of oral hypoglycemic drugs: Secondary | ICD-10-CM | POA: Diagnosis not present

## 2018-09-07 DIAGNOSIS — Z7982 Long term (current) use of aspirin: Secondary | ICD-10-CM | POA: Diagnosis not present

## 2018-09-07 DIAGNOSIS — L4050 Arthropathic psoriasis, unspecified: Secondary | ICD-10-CM | POA: Diagnosis not present

## 2018-09-07 DIAGNOSIS — G8929 Other chronic pain: Secondary | ICD-10-CM | POA: Diagnosis not present

## 2018-09-07 DIAGNOSIS — E782 Mixed hyperlipidemia: Secondary | ICD-10-CM | POA: Diagnosis not present

## 2018-09-07 DIAGNOSIS — I442 Atrioventricular block, complete: Secondary | ICD-10-CM | POA: Diagnosis not present

## 2018-09-07 DIAGNOSIS — E1151 Type 2 diabetes mellitus with diabetic peripheral angiopathy without gangrene: Secondary | ICD-10-CM | POA: Diagnosis not present

## 2018-09-07 DIAGNOSIS — M5442 Lumbago with sciatica, left side: Secondary | ICD-10-CM | POA: Diagnosis not present

## 2018-09-07 DIAGNOSIS — Z9181 History of falling: Secondary | ICD-10-CM | POA: Diagnosis not present

## 2018-09-07 DIAGNOSIS — R202 Paresthesia of skin: Secondary | ICD-10-CM | POA: Diagnosis not present

## 2018-09-07 NOTE — Telephone Encounter (Signed)
PT Monique from [facility] calling back to get orders please give her a call back at [phone]

## 2018-09-07 NOTE — Telephone Encounter (Signed)
See note

## 2018-09-07 NOTE — Telephone Encounter (Signed)
Called spoke to [place] and gave verbal orders for that.

## 2018-09-12 DIAGNOSIS — Z950 Presence of cardiac pacemaker: Secondary | ICD-10-CM | POA: Diagnosis not present

## 2018-09-12 DIAGNOSIS — Z7984 Long term (current) use of oral hypoglycemic drugs: Secondary | ICD-10-CM | POA: Diagnosis not present

## 2018-09-12 DIAGNOSIS — E782 Mixed hyperlipidemia: Secondary | ICD-10-CM | POA: Diagnosis not present

## 2018-09-12 DIAGNOSIS — Z9181 History of falling: Secondary | ICD-10-CM | POA: Diagnosis not present

## 2018-09-12 DIAGNOSIS — R202 Paresthesia of skin: Secondary | ICD-10-CM | POA: Diagnosis not present

## 2018-09-12 DIAGNOSIS — G8929 Other chronic pain: Secondary | ICD-10-CM | POA: Diagnosis not present

## 2018-09-12 DIAGNOSIS — M5441 Lumbago with sciatica, right side: Secondary | ICD-10-CM | POA: Diagnosis not present

## 2018-09-12 DIAGNOSIS — E1151 Type 2 diabetes mellitus with diabetic peripheral angiopathy without gangrene: Secondary | ICD-10-CM | POA: Diagnosis not present

## 2018-09-12 DIAGNOSIS — D649 Anemia, unspecified: Secondary | ICD-10-CM | POA: Diagnosis not present

## 2018-09-12 DIAGNOSIS — E1169 Type 2 diabetes mellitus with other specified complication: Secondary | ICD-10-CM | POA: Diagnosis not present

## 2018-09-12 DIAGNOSIS — Z7982 Long term (current) use of aspirin: Secondary | ICD-10-CM | POA: Diagnosis not present

## 2018-09-12 DIAGNOSIS — L4050 Arthropathic psoriasis, unspecified: Secondary | ICD-10-CM | POA: Diagnosis not present

## 2018-09-12 DIAGNOSIS — R42 Dizziness and giddiness: Secondary | ICD-10-CM | POA: Diagnosis not present

## 2018-09-12 DIAGNOSIS — M5442 Lumbago with sciatica, left side: Secondary | ICD-10-CM | POA: Diagnosis not present

## 2018-09-12 DIAGNOSIS — I442 Atrioventricular block, complete: Secondary | ICD-10-CM | POA: Diagnosis not present

## 2018-09-14 ENCOUNTER — Telehealth: Payer: Self-pay | Admitting: Family Medicine

## 2018-09-14 ENCOUNTER — Ambulatory Visit: Payer: Medicare Other | Admitting: Family Medicine

## 2018-09-14 DIAGNOSIS — Z950 Presence of cardiac pacemaker: Secondary | ICD-10-CM | POA: Diagnosis not present

## 2018-09-14 DIAGNOSIS — L4050 Arthropathic psoriasis, unspecified: Secondary | ICD-10-CM | POA: Diagnosis not present

## 2018-09-14 DIAGNOSIS — Z9181 History of falling: Secondary | ICD-10-CM | POA: Diagnosis not present

## 2018-09-14 DIAGNOSIS — M5442 Lumbago with sciatica, left side: Secondary | ICD-10-CM | POA: Diagnosis not present

## 2018-09-14 DIAGNOSIS — E1169 Type 2 diabetes mellitus with other specified complication: Secondary | ICD-10-CM | POA: Diagnosis not present

## 2018-09-14 DIAGNOSIS — E782 Mixed hyperlipidemia: Secondary | ICD-10-CM | POA: Diagnosis not present

## 2018-09-14 DIAGNOSIS — D649 Anemia, unspecified: Secondary | ICD-10-CM | POA: Diagnosis not present

## 2018-09-14 DIAGNOSIS — M5441 Lumbago with sciatica, right side: Secondary | ICD-10-CM | POA: Diagnosis not present

## 2018-09-14 DIAGNOSIS — G8929 Other chronic pain: Secondary | ICD-10-CM | POA: Diagnosis not present

## 2018-09-14 DIAGNOSIS — R202 Paresthesia of skin: Secondary | ICD-10-CM | POA: Diagnosis not present

## 2018-09-14 DIAGNOSIS — Z7984 Long term (current) use of oral hypoglycemic drugs: Secondary | ICD-10-CM | POA: Diagnosis not present

## 2018-09-14 DIAGNOSIS — Z7982 Long term (current) use of aspirin: Secondary | ICD-10-CM | POA: Diagnosis not present

## 2018-09-14 DIAGNOSIS — E1151 Type 2 diabetes mellitus with diabetic peripheral angiopathy without gangrene: Secondary | ICD-10-CM | POA: Diagnosis not present

## 2018-09-14 DIAGNOSIS — R42 Dizziness and giddiness: Secondary | ICD-10-CM | POA: Diagnosis not present

## 2018-09-14 DIAGNOSIS — I442 Atrioventricular block, complete: Secondary | ICD-10-CM | POA: Diagnosis not present

## 2018-09-14 NOTE — Telephone Encounter (Signed)
Verbal orders given to home health OT as listed below. Orders given to [facility].

## 2018-09-14 NOTE — Telephone Encounter (Signed)
See note

## 2018-09-14 NOTE — Telephone Encounter (Signed)
Copied from [place] [id number]. Topic: Quick Communication - Home Health Verbal Orders >> Sep 14, 2018  1:12 PM Rutherford Nail, [place] wrote: Caller/Agency: Sharyn Lull, [place] Number: [phone] (can leave a voicemail) Requesting OT/PT/Skilled Nursing/Social Work: OT Frequency:  1x a week for 1 week 2x a week for 3 week 1x a week for 1 week

## 2018-09-18 ENCOUNTER — Telehealth: Payer: Self-pay | Admitting: Family Medicine

## 2018-09-18 DIAGNOSIS — I82503 Chronic embolism and thrombosis of unspecified deep veins of lower extremity, bilateral: Secondary | ICD-10-CM

## 2018-09-18 DIAGNOSIS — IMO0001 Reserved for inherently not codable concepts without codable children: Secondary | ICD-10-CM | POA: Insufficient documentation

## 2018-09-18 DIAGNOSIS — F0390 Unspecified dementia without behavioral disturbance: Secondary | ICD-10-CM | POA: Insufficient documentation

## 2018-09-18 DIAGNOSIS — F02A0 Dementia in other diseases classified elsewhere, mild, without behavioral disturbance, psychotic disturbance, mood disturbance, and anxiety: Secondary | ICD-10-CM | POA: Insufficient documentation

## 2018-09-18 NOTE — Telephone Encounter (Signed)
Copied from [place]. Topic: Quick Communication - Rx Refill/Question >> Sep 18, 2018 11:34 AM Blase Mess A wrote: Medication: HYDROcodone-acetaminophen (NORCO/VICODIN) 5-325 MG tablet [[id number]]   Has the patient contacted their pharmacy? Yes  (Agent: If no, request that the patient contact the pharmacy for the refill.) (Agent: If yes, when and what did the pharmacy advise?)  Preferred Pharmacy (with phone number or street name): [facility], [place] - [place], [place] - [address] [phone] (Phone) [phone] (Fax)    Agent: Please be advised that RX refills may take up to 3 business days. We ask that you follow-up with your pharmacy.

## 2018-09-19 ENCOUNTER — Telehealth: Payer: Self-pay

## 2018-09-19 DIAGNOSIS — M5442 Lumbago with sciatica, left side: Secondary | ICD-10-CM

## 2018-09-19 DIAGNOSIS — E1169 Type 2 diabetes mellitus with other specified complication: Secondary | ICD-10-CM

## 2018-09-19 DIAGNOSIS — R42 Dizziness and giddiness: Secondary | ICD-10-CM

## 2018-09-19 DIAGNOSIS — E782 Mixed hyperlipidemia: Secondary | ICD-10-CM

## 2018-09-19 DIAGNOSIS — Z950 Presence of cardiac pacemaker: Secondary | ICD-10-CM

## 2018-09-19 DIAGNOSIS — Z9181 History of falling: Secondary | ICD-10-CM

## 2018-09-19 DIAGNOSIS — E669 Obesity, unspecified: Secondary | ICD-10-CM

## 2018-09-19 DIAGNOSIS — L4050 Arthropathic psoriasis, unspecified: Secondary | ICD-10-CM

## 2018-09-19 DIAGNOSIS — Z7984 Long term (current) use of oral hypoglycemic drugs: Secondary | ICD-10-CM

## 2018-09-19 DIAGNOSIS — Z7982 Long term (current) use of aspirin: Secondary | ICD-10-CM

## 2018-09-19 DIAGNOSIS — D649 Anemia, unspecified: Secondary | ICD-10-CM

## 2018-09-19 DIAGNOSIS — R32 Unspecified urinary incontinence: Secondary | ICD-10-CM

## 2018-09-19 DIAGNOSIS — G8929 Other chronic pain: Secondary | ICD-10-CM

## 2018-09-19 DIAGNOSIS — I442 Atrioventricular block, complete: Secondary | ICD-10-CM

## 2018-09-19 DIAGNOSIS — E1151 Type 2 diabetes mellitus with diabetic peripheral angiopathy without gangrene: Secondary | ICD-10-CM

## 2018-09-19 DIAGNOSIS — Z6836 Body mass index (BMI) 36.0-36.9, adult: Secondary | ICD-10-CM

## 2018-09-19 DIAGNOSIS — R202 Paresthesia of skin: Secondary | ICD-10-CM

## 2018-09-19 DIAGNOSIS — M5441 Lumbago with sciatica, right side: Secondary | ICD-10-CM

## 2018-09-19 NOTE — Telephone Encounter (Signed)
PPW in your box for review.

## 2018-09-19 NOTE — Telephone Encounter (Signed)
Completed and given to front office.

## 2018-09-20 DIAGNOSIS — R42 Dizziness and giddiness: Secondary | ICD-10-CM | POA: Diagnosis not present

## 2018-09-20 DIAGNOSIS — R202 Paresthesia of skin: Secondary | ICD-10-CM | POA: Diagnosis not present

## 2018-09-20 DIAGNOSIS — M5441 Lumbago with sciatica, right side: Secondary | ICD-10-CM | POA: Diagnosis not present

## 2018-09-20 DIAGNOSIS — M5442 Lumbago with sciatica, left side: Secondary | ICD-10-CM | POA: Diagnosis not present

## 2018-09-20 DIAGNOSIS — Z7982 Long term (current) use of aspirin: Secondary | ICD-10-CM | POA: Diagnosis not present

## 2018-09-20 DIAGNOSIS — I442 Atrioventricular block, complete: Secondary | ICD-10-CM | POA: Diagnosis not present

## 2018-09-20 DIAGNOSIS — Z7984 Long term (current) use of oral hypoglycemic drugs: Secondary | ICD-10-CM | POA: Diagnosis not present

## 2018-09-20 DIAGNOSIS — G8929 Other chronic pain: Secondary | ICD-10-CM | POA: Diagnosis not present

## 2018-09-20 DIAGNOSIS — Z9181 History of falling: Secondary | ICD-10-CM | POA: Diagnosis not present

## 2018-09-20 DIAGNOSIS — L4050 Arthropathic psoriasis, unspecified: Secondary | ICD-10-CM | POA: Diagnosis not present

## 2018-09-20 DIAGNOSIS — Z950 Presence of cardiac pacemaker: Secondary | ICD-10-CM | POA: Diagnosis not present

## 2018-09-20 DIAGNOSIS — E1169 Type 2 diabetes mellitus with other specified complication: Secondary | ICD-10-CM | POA: Diagnosis not present

## 2018-09-20 DIAGNOSIS — E1151 Type 2 diabetes mellitus with diabetic peripheral angiopathy without gangrene: Secondary | ICD-10-CM | POA: Diagnosis not present

## 2018-09-20 DIAGNOSIS — E782 Mixed hyperlipidemia: Secondary | ICD-10-CM | POA: Diagnosis not present

## 2018-09-20 DIAGNOSIS — D649 Anemia, unspecified: Secondary | ICD-10-CM | POA: Diagnosis not present

## 2018-09-21 DIAGNOSIS — R202 Paresthesia of skin: Secondary | ICD-10-CM | POA: Diagnosis not present

## 2018-09-21 DIAGNOSIS — L4050 Arthropathic psoriasis, unspecified: Secondary | ICD-10-CM | POA: Diagnosis not present

## 2018-09-21 DIAGNOSIS — M5442 Lumbago with sciatica, left side: Secondary | ICD-10-CM | POA: Diagnosis not present

## 2018-09-21 DIAGNOSIS — E1151 Type 2 diabetes mellitus with diabetic peripheral angiopathy without gangrene: Secondary | ICD-10-CM | POA: Diagnosis not present

## 2018-09-21 DIAGNOSIS — I442 Atrioventricular block, complete: Secondary | ICD-10-CM | POA: Diagnosis not present

## 2018-09-21 DIAGNOSIS — G8929 Other chronic pain: Secondary | ICD-10-CM | POA: Diagnosis not present

## 2018-09-21 DIAGNOSIS — M5441 Lumbago with sciatica, right side: Secondary | ICD-10-CM | POA: Diagnosis not present

## 2018-09-21 DIAGNOSIS — E1169 Type 2 diabetes mellitus with other specified complication: Secondary | ICD-10-CM | POA: Diagnosis not present

## 2018-09-21 DIAGNOSIS — Z950 Presence of cardiac pacemaker: Secondary | ICD-10-CM | POA: Diagnosis not present

## 2018-09-21 DIAGNOSIS — E782 Mixed hyperlipidemia: Secondary | ICD-10-CM | POA: Diagnosis not present

## 2018-09-21 DIAGNOSIS — Z7984 Long term (current) use of oral hypoglycemic drugs: Secondary | ICD-10-CM | POA: Diagnosis not present

## 2018-09-21 DIAGNOSIS — Z9181 History of falling: Secondary | ICD-10-CM | POA: Diagnosis not present

## 2018-09-21 DIAGNOSIS — R42 Dizziness and giddiness: Secondary | ICD-10-CM | POA: Diagnosis not present

## 2018-09-21 DIAGNOSIS — D649 Anemia, unspecified: Secondary | ICD-10-CM | POA: Diagnosis not present

## 2018-09-21 DIAGNOSIS — Z7982 Long term (current) use of aspirin: Secondary | ICD-10-CM | POA: Diagnosis not present

## 2018-09-22 DIAGNOSIS — Z7984 Long term (current) use of oral hypoglycemic drugs: Secondary | ICD-10-CM | POA: Diagnosis not present

## 2018-09-22 DIAGNOSIS — R202 Paresthesia of skin: Secondary | ICD-10-CM | POA: Diagnosis not present

## 2018-09-22 DIAGNOSIS — E1169 Type 2 diabetes mellitus with other specified complication: Secondary | ICD-10-CM | POA: Diagnosis not present

## 2018-09-22 DIAGNOSIS — I442 Atrioventricular block, complete: Secondary | ICD-10-CM | POA: Diagnosis not present

## 2018-09-22 DIAGNOSIS — R42 Dizziness and giddiness: Secondary | ICD-10-CM | POA: Diagnosis not present

## 2018-09-22 DIAGNOSIS — G8929 Other chronic pain: Secondary | ICD-10-CM | POA: Diagnosis not present

## 2018-09-22 DIAGNOSIS — M5441 Lumbago with sciatica, right side: Secondary | ICD-10-CM | POA: Diagnosis not present

## 2018-09-22 DIAGNOSIS — Z7982 Long term (current) use of aspirin: Secondary | ICD-10-CM | POA: Diagnosis not present

## 2018-09-22 DIAGNOSIS — L4050 Arthropathic psoriasis, unspecified: Secondary | ICD-10-CM | POA: Diagnosis not present

## 2018-09-22 DIAGNOSIS — E1151 Type 2 diabetes mellitus with diabetic peripheral angiopathy without gangrene: Secondary | ICD-10-CM | POA: Diagnosis not present

## 2018-09-22 DIAGNOSIS — Z950 Presence of cardiac pacemaker: Secondary | ICD-10-CM | POA: Diagnosis not present

## 2018-09-22 DIAGNOSIS — E782 Mixed hyperlipidemia: Secondary | ICD-10-CM | POA: Diagnosis not present

## 2018-09-22 DIAGNOSIS — D649 Anemia, unspecified: Secondary | ICD-10-CM | POA: Diagnosis not present

## 2018-09-22 DIAGNOSIS — M5442 Lumbago with sciatica, left side: Secondary | ICD-10-CM | POA: Diagnosis not present

## 2018-09-22 DIAGNOSIS — Z9181 History of falling: Secondary | ICD-10-CM | POA: Diagnosis not present

## 2018-09-22 NOTE — Telephone Encounter (Signed)
Pt's daughter called in to follow up on refill request. Spoke with nurse, medication is unable to be fill until 09/28/18 based on last filled date. Advised pt's daughter, she expressed understanding.

## 2018-09-25 DIAGNOSIS — E1151 Type 2 diabetes mellitus with diabetic peripheral angiopathy without gangrene: Secondary | ICD-10-CM | POA: Diagnosis not present

## 2018-09-25 DIAGNOSIS — D649 Anemia, unspecified: Secondary | ICD-10-CM | POA: Diagnosis not present

## 2018-09-25 DIAGNOSIS — I442 Atrioventricular block, complete: Secondary | ICD-10-CM | POA: Diagnosis not present

## 2018-09-25 DIAGNOSIS — M5442 Lumbago with sciatica, left side: Secondary | ICD-10-CM | POA: Diagnosis not present

## 2018-09-25 DIAGNOSIS — R42 Dizziness and giddiness: Secondary | ICD-10-CM | POA: Diagnosis not present

## 2018-09-25 DIAGNOSIS — Z950 Presence of cardiac pacemaker: Secondary | ICD-10-CM | POA: Diagnosis not present

## 2018-09-25 DIAGNOSIS — Z7984 Long term (current) use of oral hypoglycemic drugs: Secondary | ICD-10-CM | POA: Diagnosis not present

## 2018-09-25 DIAGNOSIS — G8929 Other chronic pain: Secondary | ICD-10-CM | POA: Diagnosis not present

## 2018-09-25 DIAGNOSIS — Z9181 History of falling: Secondary | ICD-10-CM | POA: Diagnosis not present

## 2018-09-25 DIAGNOSIS — M5441 Lumbago with sciatica, right side: Secondary | ICD-10-CM | POA: Diagnosis not present

## 2018-09-25 DIAGNOSIS — R202 Paresthesia of skin: Secondary | ICD-10-CM | POA: Diagnosis not present

## 2018-09-25 DIAGNOSIS — L4050 Arthropathic psoriasis, unspecified: Secondary | ICD-10-CM | POA: Diagnosis not present

## 2018-09-25 DIAGNOSIS — Z7982 Long term (current) use of aspirin: Secondary | ICD-10-CM | POA: Diagnosis not present

## 2018-09-25 DIAGNOSIS — E1169 Type 2 diabetes mellitus with other specified complication: Secondary | ICD-10-CM | POA: Diagnosis not present

## 2018-09-25 DIAGNOSIS — E782 Mixed hyperlipidemia: Secondary | ICD-10-CM | POA: Diagnosis not present

## 2018-09-25 NOTE — Progress Notes (Signed)
Krista Clarke is a 78 y.o. female is here for follow up.  History of Present Illness:   HPI:   1. Diabetes mellitus type 2 in obese ([place]), on Metformin. Medication compliance: compliant all of the time, diabetic diet compliance: noncompliant some of the time, home glucose monitoring: is not performed, further diabetic ROS: no polyuria or polydipsia, no chest pain, dyspnea or TIA's.   2. Hyperlipidemia associated with type 2 diabetes mellitus ([place])  Lab Results  Component Value Date   CHOL 136 09/26/2018   HDL 53.10 09/26/2018   LDLCALC 61 09/26/2018   TRIG 110.0 09/26/2018   CHOLHDL 3 09/26/2018   Lab Results  Component Value Date   ALT 11 09/26/2018   AST 12 09/26/2018   ALKPHOS 78 09/26/2018   BILITOT 0.3 09/26/2018     3. Hypertension associated with diabetes ([place]), on Norvasc, Hyzaar, Lasix/K, Metoprolol, followed by Cardiology/   4. PAD (peripheral artery disease) (HCC)   5. Paresthesia of both lower extremities. Not controlled. Still with numbness and tingling. Tolerating Gabapentin without side effects.    6. Bilateral lower extremity edema. Baseline.    There are no preventive care reminders to display for this patient. Depression screen [facility] 2/9 06/28/2018 05/12/2018 04/28/2017  Decreased Interest 0 1 0  Down, Depressed, Hopeless 0 1 0  PHQ - 2 Score 0 2 0  Altered sleeping - 1 -  Tired, decreased energy - 1 -  Change in appetite - 1 -  Feeling bad or failure about yourself  - 1 -  Trouble concentrating - 1 -  Moving slowly or fidgety/restless - 1 -  Suicidal thoughts - 0 -  PHQ-9 Score - 8 -  Difficult doing work/chores - Somewhat difficult -   PMHx, SurgHx, SocialHx, FamHx, Medications, and Allergies were reviewed in the Visit Navigator and updated as appropriate.   Patient Active Problem List   Diagnosis Date Noted  . Urinary incontinence in female, followed by GYN, with pessary 06/27/2018  . LBBB (left bundle branch block) 11/18/2016  . Pacemaker  11/18/2016  . PAD (peripheral artery disease) ([place]) 11/18/2016  . 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 Chronic midline low back pain with bilateral sciatica, stable on Norco for breakthrough pain 10/02/2016  . Paresthesia of both lower extremities 10/02/2016  . Morbid obesity ([place]) 10/02/2016  . Hyperlipidemia associated with type 2 diabetes mellitus ([place]), on Lipitor 10/02/2016  . Hypertension associated with diabetes ([place]), on Norvasc, Hyzaar, Lasix/K, Metoprolol, followed by Cardiology 03/08/2016  . Microcytic anemia 03/08/2016  . Diabetes mellitus type 2 in obese ([place]), on Metformin 03/08/2016  . Complete heart block ([place]) 08/20/2015  . Angina pectoris ([place]) 08/26/2014   Social History   Tobacco Use  . Smoking status: Never Smoker  . Smokeless tobacco: Never Used  Substance Use Topics  . Alcohol use: No  . Drug use: No   Current Medications and Allergies   Current Outpatient Medications:  .  allopurinol (ZYLOPRIM) 100 MG tablet, Take 1 tablet (100 mg total) by mouth every morning., Disp: 90 tablet, Rfl: 1 .  amLODipine (NORVASC) 10 MG tablet, Take 1 tablet (10 mg total) by mouth daily., Disp: 90 tablet, Rfl: 1 .  ammonium lactate (LAC-HYDRIN) 12 % cream, Apply topically as needed for dry skin., Disp: 385 g, Rfl: 0 .  aspirin 81 MG chewable tablet, Chew 1 tablet (81 mg total) by mouth daily., Disp: 90 tablet, Rfl: 3 .  atorvastatin (LIPITOR) 40 MG tablet, Take 1  tablet (40 mg total) by mouth daily., Disp: 90 tablet, Rfl: 3 .  calcium citrate-vitamin D (CALCIUM CITRATE + D) 315-200 MG-UNIT tablet, Take 1 tablet by mouth daily., Disp: 90 tablet, Rfl: 3 .  clopidogrel (PLAVIX) 75 MG tablet, Take 1 tablet (75 mg total) by mouth daily., Disp: 90 tablet, Rfl: 3 .  ferrous sulfate 325 (65 FE) MG EC tablet, Take 1 tablet (325 mg total) by mouth 2 (two) times daily., Disp: 90 tablet, Rfl: 3 .  furosemide (LASIX) 20 MG tablet, TAKE 1 TABLET BY MOUTH EVERY DAY, Disp: 30 tablet,  Rfl: 2 .  HYDROcodone-acetaminophen (NORCO/VICODIN) 5-325 MG tablet, Take 1 tablet by mouth 4 (four) times daily as needed (Chronic Pain)., Disp: 120 tablet, Rfl: 0 .  losartan (COZAAR) 50 MG tablet, Take 1 tablet (50 mg total) by mouth daily., Disp: 90 tablet, Rfl: 3 .  Lutein 20 MG TABS, Take 1 tablet (20 mg total) by mouth daily., Disp: 90 tablet, Rfl: 3 .  magnesium oxide (MAG-OX) 400 MG tablet, Take 1 tablet (400 mg total) by mouth daily., Disp: 90 tablet, Rfl: 3 .  metFORMIN (GLUCOPHAGE) 1000 MG tablet, Take 1 tablet (1,000 mg total) by mouth 2 (two) times daily with a meal., Disp: 60 tablet, Rfl: 11 .  metoprolol tartrate (LOPRESSOR) 25 MG tablet, Take 1 tablet (25 mg total) by mouth 2 (two) times daily., Disp: 90 tablet, Rfl: 2 .  omeprazole (PRILOSEC) 20 MG capsule, Take 1 capsule (20 mg total) by mouth daily., Disp: 90 capsule, Rfl: 3 .  potassium chloride (K-DUR) 10 MEQ tablet, Two tablets twice a day, Disp: 90 tablet, Rfl: 1 .  vitamin C (ASCORBIC ACID) 500 MG tablet, Take 1 tablet (500 mg total) by mouth daily., Disp: 90 tablet, Rfl: 0 .  gabapentin (NEURONTIN) 300 MG capsule, Take 1 capsule (300 mg total) by mouth 3 (three) times daily., Disp: 90 capsule, Rfl: 3   Allergies  Allergen Reactions  . Prednisone Palpitations and Other (See Comments)    Doesn't work well for patient   Review of Systems   Pertinent items are noted in the HPI. Otherwise, a complete ROS is negative.  Vitals   Vitals:   09/26/18 1413  BP: (!) 142/90  Pulse: 85  Temp: 97.6 F (36.4 C)  TempSrc: Oral  SpO2: 98%  Weight: 217 lb (98.4 kg)  Height: 5\' 2"  (1.575 m)     Body mass index is 39.69 kg/m.  Physical Exam   Physical Exam Vitals signs and nursing note reviewed.  Constitutional:      General: She is not in acute distress.    Appearance: She is well-developed.  HENT:     Head: Normocephalic and atraumatic.  Eyes:     Conjunctiva/sclera: Conjunctivae normal.     Pupils: Pupils are  equal, round, and reactive to light.  Neck:     Musculoskeletal: Normal range of motion and neck supple.     Thyroid: No thyromegaly.  Cardiovascular:     Rate and Rhythm: Normal rate and regular rhythm.  Pulmonary:     Effort: Pulmonary effort is normal.     Breath sounds: Normal breath sounds.  Abdominal:     Palpations: Abdomen is soft.  Musculoskeletal:     Comments: Nonpitting edema only to bilateral feet.  Otherwise at baseline and not concerning.  Skin:    General: Skin is warm.  Neurological:     Mental Status: She is alert and oriented to person, place, and time.  Psychiatric:        Behavior: Behavior normal.    Assessment and Plan   Neira was seen today for 3 month follow up.  Diagnoses and all orders for this visit:  Diabetes mellitus type 2 in obese ([place]), on Metformin -     Comprehensive metabolic panel -     Hemoglobin A1c  Hyperlipidemia associated with type 2 diabetes mellitus ([place]) -     Lipid panel  Hypertension associated with diabetes ([place]), on Norvasc, Hyzaar, Lasix/K, Metoprolol, followed by Cardiology  PAD (peripheral artery disease) ([place]) -     CBC with Differential/Platelet -     TSH  Paresthesia of both lower extremities -     CBC with Differential/Platelet -     Vitamin B12 -     TSH -     gabapentin (NEURONTIN) 300 MG capsule; Take 1 capsule (300 mg total) by mouth 3 (three) times daily.  Bilateral lower extremity edema -     Cancel: Brain natriuretic peptide -     Brain natriuretic peptide; Future  Need for shingles vaccine -     Cancel: Varicella-zoster vaccine IM (Shingrix) -     Cancel: Varicella-zoster vaccine IM (Shingrix)  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HCC)  Chronic midline low back pain with bilateral sciatica, stable on Norco for breakthrough pain  Morbid obesity ([place])    . Orders and follow up as documented in [place], reviewed diet, exercise and weight control, cardiovascular risk and specific lipid/LDL goals  reviewed, reviewed medications and side effects in detail.  . Reviewed expectations re: course of current medical issues. . Outlined signs and symptoms indicating need for more acute intervention. . Patient verbalized understanding and all questions were answered. . Patient received an After Visit Summary.  Briscoe Deutscher, DO [place], [facility] 09/27/2018

## 2018-09-26 ENCOUNTER — Other Ambulatory Visit: Payer: Self-pay | Admitting: Family Medicine

## 2018-09-26 ENCOUNTER — Ambulatory Visit (INDEPENDENT_AMBULATORY_CARE_PROVIDER_SITE_OTHER): Payer: Medicare Other | Admitting: Family Medicine

## 2018-09-26 ENCOUNTER — Encounter: Payer: Self-pay | Admitting: Family Medicine

## 2018-09-26 VITALS — BP 142/90 | HR 85 | Temp 97.6°F | Ht 62.0 in | Wt 217.0 lb

## 2018-09-26 DIAGNOSIS — E669 Obesity, unspecified: Secondary | ICD-10-CM

## 2018-09-26 DIAGNOSIS — R600 Localized edema: Secondary | ICD-10-CM

## 2018-09-26 DIAGNOSIS — M5442 Lumbago with sciatica, left side: Secondary | ICD-10-CM

## 2018-09-26 DIAGNOSIS — R202 Paresthesia of skin: Secondary | ICD-10-CM

## 2018-09-26 DIAGNOSIS — M5441 Lumbago with sciatica, right side: Secondary | ICD-10-CM

## 2018-09-26 DIAGNOSIS — E1169 Type 2 diabetes mellitus with other specified complication: Secondary | ICD-10-CM

## 2018-09-26 DIAGNOSIS — L4050 Arthropathic psoriasis, unspecified: Secondary | ICD-10-CM

## 2018-09-26 DIAGNOSIS — I739 Peripheral vascular disease, unspecified: Secondary | ICD-10-CM

## 2018-09-26 DIAGNOSIS — Z23 Encounter for immunization: Secondary | ICD-10-CM

## 2018-09-26 DIAGNOSIS — E1159 Type 2 diabetes mellitus with other circulatory complications: Secondary | ICD-10-CM | POA: Diagnosis not present

## 2018-09-26 DIAGNOSIS — E785 Hyperlipidemia, unspecified: Secondary | ICD-10-CM | POA: Diagnosis not present

## 2018-09-26 DIAGNOSIS — G8929 Other chronic pain: Secondary | ICD-10-CM

## 2018-09-26 DIAGNOSIS — I152 Hypertension secondary to endocrine disorders: Secondary | ICD-10-CM

## 2018-09-26 DIAGNOSIS — I10 Essential (primary) hypertension: Secondary | ICD-10-CM

## 2018-09-26 NOTE — Patient Instructions (Signed)
Go to your local pharmacy for a [facility] vaccine.  Schedule an appointment to see the Health Coach for an Annual Wellness Visit.

## 2018-09-27 ENCOUNTER — Encounter: Payer: Self-pay | Admitting: Family Medicine

## 2018-09-27 ENCOUNTER — Telehealth: Payer: Self-pay | Admitting: Radiology

## 2018-09-27 ENCOUNTER — Other Ambulatory Visit: Payer: Self-pay | Admitting: Pharmacist

## 2018-09-27 ENCOUNTER — Other Ambulatory Visit (INDEPENDENT_AMBULATORY_CARE_PROVIDER_SITE_OTHER): Payer: Medicare Other

## 2018-09-27 DIAGNOSIS — M5441 Lumbago with sciatica, right side: Secondary | ICD-10-CM | POA: Diagnosis not present

## 2018-09-27 DIAGNOSIS — G8929 Other chronic pain: Secondary | ICD-10-CM | POA: Diagnosis not present

## 2018-09-27 DIAGNOSIS — E782 Mixed hyperlipidemia: Secondary | ICD-10-CM | POA: Diagnosis not present

## 2018-09-27 DIAGNOSIS — E1151 Type 2 diabetes mellitus with diabetic peripheral angiopathy without gangrene: Secondary | ICD-10-CM | POA: Diagnosis not present

## 2018-09-27 DIAGNOSIS — R600 Localized edema: Secondary | ICD-10-CM

## 2018-09-27 DIAGNOSIS — R42 Dizziness and giddiness: Secondary | ICD-10-CM | POA: Diagnosis not present

## 2018-09-27 DIAGNOSIS — E1169 Type 2 diabetes mellitus with other specified complication: Secondary | ICD-10-CM | POA: Diagnosis not present

## 2018-09-27 DIAGNOSIS — Z950 Presence of cardiac pacemaker: Secondary | ICD-10-CM | POA: Diagnosis not present

## 2018-09-27 DIAGNOSIS — Z9181 History of falling: Secondary | ICD-10-CM | POA: Diagnosis not present

## 2018-09-27 DIAGNOSIS — Z7982 Long term (current) use of aspirin: Secondary | ICD-10-CM | POA: Diagnosis not present

## 2018-09-27 DIAGNOSIS — M5442 Lumbago with sciatica, left side: Secondary | ICD-10-CM | POA: Diagnosis not present

## 2018-09-27 DIAGNOSIS — Z7984 Long term (current) use of oral hypoglycemic drugs: Secondary | ICD-10-CM | POA: Diagnosis not present

## 2018-09-27 DIAGNOSIS — R202 Paresthesia of skin: Secondary | ICD-10-CM | POA: Diagnosis not present

## 2018-09-27 DIAGNOSIS — L4050 Arthropathic psoriasis, unspecified: Secondary | ICD-10-CM | POA: Diagnosis not present

## 2018-09-27 DIAGNOSIS — I442 Atrioventricular block, complete: Secondary | ICD-10-CM | POA: Diagnosis not present

## 2018-09-27 DIAGNOSIS — D649 Anemia, unspecified: Secondary | ICD-10-CM | POA: Diagnosis not present

## 2018-09-27 LAB — CBC WITH DIFFERENTIAL/PLATELET
Basophils Absolute: 0.1 10*3/uL (ref 0.0–0.1)
Basophils Relative: 1.1 % (ref 0.0–3.0)
Eosinophils Absolute: 0.1 10*3/uL (ref 0.0–0.7)
Eosinophils Relative: 1.8 % (ref 0.0–5.0)
HCT: 32 % — ABNORMAL LOW (ref 36.0–46.0)
Hemoglobin: 10.2 g/dL — ABNORMAL LOW (ref 12.0–15.0)
Lymphocytes Relative: 18.2 % (ref 12.0–46.0)
Lymphs Abs: 1.3 10*3/uL (ref 0.7–4.0)
MCHC: 31.8 g/dL (ref 30.0–36.0)
MCV: 78.6 fl (ref 78.0–100.0)
Monocytes Absolute: 0.4 10*3/uL (ref 0.1–1.0)
Monocytes Relative: 6.1 % (ref 3.0–12.0)
Neutro Abs: 5.3 10*3/uL (ref 1.4–7.7)
Neutrophils Relative %: 72.8 % (ref 43.0–77.0)
Platelets: 279 10*3/uL (ref 150.0–400.0)
RBC: 4.08 Mil/uL (ref 3.87–5.11)
RDW: 15.5 % (ref 11.5–15.5)
WBC: 7.2 10*3/uL (ref 4.0–10.5)

## 2018-09-27 LAB — COMPREHENSIVE METABOLIC PANEL
ALT: 11 U/L (ref 0–35)
AST: 12 U/L (ref 0–37)
Albumin: 4.7 g/dL (ref 3.5–5.2)
Alkaline Phosphatase: 78 U/L (ref 39–117)
BUN: 28 mg/dL — ABNORMAL HIGH (ref 6–23)
CO2: 26 mEq/L (ref 19–32)
Calcium: 10 mg/dL (ref 8.4–10.5)
Chloride: 102 mEq/L (ref 96–112)
Creatinine, Ser: 1.14 mg/dL (ref 0.40–1.20)
GFR: 55.62 mL/min — ABNORMAL LOW (ref >60.00)
Glucose, Bld: 153 mg/dL — ABNORMAL HIGH (ref 70–99)
Potassium: 5 mEq/L (ref 3.5–5.1)
Sodium: 138 mEq/L (ref 135–145)
Total Bilirubin: 0.3 mg/dL (ref 0.2–1.2)
Total Protein: 7.9 g/dL (ref 6.0–8.3)

## 2018-09-27 LAB — LIPID PANEL
Cholesterol: 136 mg/dL (ref 0–200)
HDL: 53.1 mg/dL (ref >39.00)
LDL Cholesterol: 61 mg/dL (ref 0–99)
NonHDL: 82.66
Total CHOL/HDL Ratio: 3
Triglycerides: 110 mg/dL (ref 0.0–149.0)
VLDL: 22 mg/dL (ref 0.0–40.0)

## 2018-09-27 LAB — HEMOGLOBIN A1C: Hgb A1c MFr Bld: 7.2 % — ABNORMAL HIGH (ref 4.6–6.5)

## 2018-09-27 LAB — TSH: TSH: 3.69 u[IU]/mL (ref 0.35–4.50)

## 2018-09-27 LAB — VITAMIN B12: Vitamin B-12: 334 pg/mL (ref 211–911)

## 2018-09-27 MED ORDER — GABAPENTIN 300 MG PO CAPS: 300.0000 mg | ORAL_CAPSULE | Freq: Three times a day (TID) | ORAL | 3 refills | Status: DC

## 2018-09-27 NOTE — Telephone Encounter (Signed)
[facility] lab called stating they were able to process all of Krista Clarke blood work except for the BNP. Called Krista Clarke asking if she could return today or tomorrow (09/28/18) to be redrawn. Spoke with Pt and she states she will see about finding transportation to the office and will call back to make a lab appointment.

## 2018-09-27 NOTE — Addendum Note (Signed)
Addended by: Kayren Eaves T on: 09/27/2018 03:14 PM   Modules accepted: Orders

## 2018-09-27 NOTE — Addendum Note (Signed)
Addended by: Kayren Eaves T on: 09/27/2018 03:33 PM   Modules accepted: Orders

## 2018-09-27 NOTE — Telephone Encounter (Signed)
Noted  

## 2018-09-27 NOTE — Patient Outreach (Signed)
[facility]) Care Management [place]  09/27/2018  Krista Clarke 1940-09-18 [id number]  Reason for referral: medication management  [facility] pharmacy case is being closed due to the following reasons:  Goals have been met. Patient continues to use compliance packaging for medication management from [facility].  No further pharmacy needs identified at this time.  Patient has been provided [facility] CM contact information if assistance needed in the future.    Thank you for allowing [facility] pharmacy to be involved in this patient's care.     [name], PharmD, [place]  [phone]

## 2018-09-28 ENCOUNTER — Telehealth: Payer: Self-pay | Admitting: Family Medicine

## 2018-09-28 DIAGNOSIS — Z7984 Long term (current) use of oral hypoglycemic drugs: Secondary | ICD-10-CM | POA: Diagnosis not present

## 2018-09-28 DIAGNOSIS — M5442 Lumbago with sciatica, left side: Secondary | ICD-10-CM | POA: Diagnosis not present

## 2018-09-28 DIAGNOSIS — I442 Atrioventricular block, complete: Secondary | ICD-10-CM | POA: Diagnosis not present

## 2018-09-28 DIAGNOSIS — Z7982 Long term (current) use of aspirin: Secondary | ICD-10-CM | POA: Diagnosis not present

## 2018-09-28 DIAGNOSIS — E1169 Type 2 diabetes mellitus with other specified complication: Secondary | ICD-10-CM | POA: Diagnosis not present

## 2018-09-28 DIAGNOSIS — E782 Mixed hyperlipidemia: Secondary | ICD-10-CM | POA: Diagnosis not present

## 2018-09-28 DIAGNOSIS — R42 Dizziness and giddiness: Secondary | ICD-10-CM | POA: Diagnosis not present

## 2018-09-28 DIAGNOSIS — M5441 Lumbago with sciatica, right side: Secondary | ICD-10-CM | POA: Diagnosis not present

## 2018-09-28 DIAGNOSIS — D649 Anemia, unspecified: Secondary | ICD-10-CM | POA: Diagnosis not present

## 2018-09-28 DIAGNOSIS — Z9181 History of falling: Secondary | ICD-10-CM | POA: Diagnosis not present

## 2018-09-28 DIAGNOSIS — Z950 Presence of cardiac pacemaker: Secondary | ICD-10-CM | POA: Diagnosis not present

## 2018-09-28 DIAGNOSIS — L4050 Arthropathic psoriasis, unspecified: Secondary | ICD-10-CM | POA: Diagnosis not present

## 2018-09-28 DIAGNOSIS — G8929 Other chronic pain: Secondary | ICD-10-CM | POA: Diagnosis not present

## 2018-09-28 DIAGNOSIS — E1151 Type 2 diabetes mellitus with diabetic peripheral angiopathy without gangrene: Secondary | ICD-10-CM | POA: Diagnosis not present

## 2018-09-28 DIAGNOSIS — R202 Paresthesia of skin: Secondary | ICD-10-CM | POA: Diagnosis not present

## 2018-09-28 LAB — BRAIN NATRIURETIC PEPTIDE: Pro B Natriuretic peptide (BNP): 100 pg/mL (ref 0.0–100.0)

## 2018-09-28 NOTE — Telephone Encounter (Signed)
Copied from [place] [id number]. Topic: Quick Communication - Home Health Verbal Orders >> Sep 28, 2018  1:36 PM Sheran Luz wrote: Caller/Agency: [facility] Number: [phone]- VM [phone]  Requesting extended PT Frequency: 2x a week for 3 weeks for strengthening, gait and balance training, Home exercise program progression and home safety.

## 2018-09-28 NOTE — Telephone Encounter (Signed)
Left message to return phone call.

## 2018-09-28 NOTE — Telephone Encounter (Signed)
See note

## 2018-09-29 NOTE — Telephone Encounter (Signed)
Left detailed message on Krista Clarke's personal voicemail,  verbal orders given for Requesting extended PT Frequency: 2x a week for 3 weeks for strengthening, gait and balance training, Home exercise program progression and home safety for pt, okay per Dr. Juleen China any questions please call office.

## 2018-10-02 ENCOUNTER — Other Ambulatory Visit: Payer: Self-pay | Admitting: Family Medicine

## 2018-10-02 MED ORDER — HYDROCODONE-ACETAMINOPHEN 5-325 MG PO TABS: 1.0000 | ORAL_TABLET | Freq: Four times a day (QID) | ORAL | 0 refills | Status: DC | PRN

## 2018-10-02 NOTE — Telephone Encounter (Signed)
See note

## 2018-10-02 NOTE — Telephone Encounter (Signed)
Copied from [place] [phone]. Topic: Quick Communication - Rx Refill/Question >> Oct 02, 2018  3:44 PM Virl Axe D wrote: Medication: HYDROcodone-acetaminophen (NORCO/VICODIN) 5-325 MG tablet  / Pharmacy stated they faxed over refill request last week with no result.  Has the patient contacted their pharmacy? Yes.   (Agent: If no, request that the patient contact the pharmacy for the refill.) (Agent: If yes, when and what did the pharmacy advise?)  Preferred Pharmacy (with phone number or street name): [facility], [place] - [place], [place] - [address] [phone] (Phone) [phone] (Fax)  Agent: Please be advised that RX refills may take up to 3 business days. We ask that you follow-up with your pharmacy.

## 2018-10-03 DIAGNOSIS — Z7982 Long term (current) use of aspirin: Secondary | ICD-10-CM | POA: Diagnosis not present

## 2018-10-03 DIAGNOSIS — E1151 Type 2 diabetes mellitus with diabetic peripheral angiopathy without gangrene: Secondary | ICD-10-CM | POA: Diagnosis not present

## 2018-10-03 DIAGNOSIS — M5441 Lumbago with sciatica, right side: Secondary | ICD-10-CM | POA: Diagnosis not present

## 2018-10-03 DIAGNOSIS — Z9181 History of falling: Secondary | ICD-10-CM | POA: Diagnosis not present

## 2018-10-03 DIAGNOSIS — L4050 Arthropathic psoriasis, unspecified: Secondary | ICD-10-CM | POA: Diagnosis not present

## 2018-10-03 DIAGNOSIS — G8929 Other chronic pain: Secondary | ICD-10-CM | POA: Diagnosis not present

## 2018-10-03 DIAGNOSIS — Z950 Presence of cardiac pacemaker: Secondary | ICD-10-CM | POA: Diagnosis not present

## 2018-10-03 DIAGNOSIS — I442 Atrioventricular block, complete: Secondary | ICD-10-CM | POA: Diagnosis not present

## 2018-10-03 DIAGNOSIS — M5442 Lumbago with sciatica, left side: Secondary | ICD-10-CM | POA: Diagnosis not present

## 2018-10-03 DIAGNOSIS — R202 Paresthesia of skin: Secondary | ICD-10-CM | POA: Diagnosis not present

## 2018-10-03 DIAGNOSIS — E782 Mixed hyperlipidemia: Secondary | ICD-10-CM | POA: Diagnosis not present

## 2018-10-03 DIAGNOSIS — Z7984 Long term (current) use of oral hypoglycemic drugs: Secondary | ICD-10-CM | POA: Diagnosis not present

## 2018-10-03 DIAGNOSIS — E1169 Type 2 diabetes mellitus with other specified complication: Secondary | ICD-10-CM | POA: Diagnosis not present

## 2018-10-03 DIAGNOSIS — D649 Anemia, unspecified: Secondary | ICD-10-CM | POA: Diagnosis not present

## 2018-10-03 DIAGNOSIS — R42 Dizziness and giddiness: Secondary | ICD-10-CM | POA: Diagnosis not present

## 2018-10-06 DIAGNOSIS — M5441 Lumbago with sciatica, right side: Secondary | ICD-10-CM | POA: Diagnosis not present

## 2018-10-06 DIAGNOSIS — I442 Atrioventricular block, complete: Secondary | ICD-10-CM | POA: Diagnosis not present

## 2018-10-06 DIAGNOSIS — Z7984 Long term (current) use of oral hypoglycemic drugs: Secondary | ICD-10-CM | POA: Diagnosis not present

## 2018-10-06 DIAGNOSIS — Z9181 History of falling: Secondary | ICD-10-CM | POA: Diagnosis not present

## 2018-10-06 DIAGNOSIS — D649 Anemia, unspecified: Secondary | ICD-10-CM | POA: Diagnosis not present

## 2018-10-06 DIAGNOSIS — E1169 Type 2 diabetes mellitus with other specified complication: Secondary | ICD-10-CM | POA: Diagnosis not present

## 2018-10-06 DIAGNOSIS — Z7982 Long term (current) use of aspirin: Secondary | ICD-10-CM | POA: Diagnosis not present

## 2018-10-06 DIAGNOSIS — L4050 Arthropathic psoriasis, unspecified: Secondary | ICD-10-CM | POA: Diagnosis not present

## 2018-10-06 DIAGNOSIS — R202 Paresthesia of skin: Secondary | ICD-10-CM | POA: Diagnosis not present

## 2018-10-06 DIAGNOSIS — M5442 Lumbago with sciatica, left side: Secondary | ICD-10-CM | POA: Diagnosis not present

## 2018-10-06 DIAGNOSIS — G8929 Other chronic pain: Secondary | ICD-10-CM | POA: Diagnosis not present

## 2018-10-06 DIAGNOSIS — R42 Dizziness and giddiness: Secondary | ICD-10-CM | POA: Diagnosis not present

## 2018-10-06 DIAGNOSIS — E782 Mixed hyperlipidemia: Secondary | ICD-10-CM | POA: Diagnosis not present

## 2018-10-06 DIAGNOSIS — E1151 Type 2 diabetes mellitus with diabetic peripheral angiopathy without gangrene: Secondary | ICD-10-CM | POA: Diagnosis not present

## 2018-10-06 DIAGNOSIS — Z950 Presence of cardiac pacemaker: Secondary | ICD-10-CM | POA: Diagnosis not present

## 2018-10-10 DIAGNOSIS — L4050 Arthropathic psoriasis, unspecified: Secondary | ICD-10-CM | POA: Diagnosis not present

## 2018-10-10 DIAGNOSIS — M5442 Lumbago with sciatica, left side: Secondary | ICD-10-CM | POA: Diagnosis not present

## 2018-10-10 DIAGNOSIS — Z9181 History of falling: Secondary | ICD-10-CM | POA: Diagnosis not present

## 2018-10-10 DIAGNOSIS — Z7984 Long term (current) use of oral hypoglycemic drugs: Secondary | ICD-10-CM | POA: Diagnosis not present

## 2018-10-10 DIAGNOSIS — E782 Mixed hyperlipidemia: Secondary | ICD-10-CM | POA: Diagnosis not present

## 2018-10-10 DIAGNOSIS — Z950 Presence of cardiac pacemaker: Secondary | ICD-10-CM | POA: Diagnosis not present

## 2018-10-10 DIAGNOSIS — E1169 Type 2 diabetes mellitus with other specified complication: Secondary | ICD-10-CM | POA: Diagnosis not present

## 2018-10-10 DIAGNOSIS — E1151 Type 2 diabetes mellitus with diabetic peripheral angiopathy without gangrene: Secondary | ICD-10-CM | POA: Diagnosis not present

## 2018-10-10 DIAGNOSIS — Z7982 Long term (current) use of aspirin: Secondary | ICD-10-CM | POA: Diagnosis not present

## 2018-10-10 DIAGNOSIS — R202 Paresthesia of skin: Secondary | ICD-10-CM | POA: Diagnosis not present

## 2018-10-10 DIAGNOSIS — R42 Dizziness and giddiness: Secondary | ICD-10-CM | POA: Diagnosis not present

## 2018-10-10 DIAGNOSIS — I442 Atrioventricular block, complete: Secondary | ICD-10-CM | POA: Diagnosis not present

## 2018-10-10 DIAGNOSIS — G8929 Other chronic pain: Secondary | ICD-10-CM | POA: Diagnosis not present

## 2018-10-10 DIAGNOSIS — M5441 Lumbago with sciatica, right side: Secondary | ICD-10-CM | POA: Diagnosis not present

## 2018-10-10 DIAGNOSIS — D649 Anemia, unspecified: Secondary | ICD-10-CM | POA: Diagnosis not present

## 2018-10-11 DIAGNOSIS — E782 Mixed hyperlipidemia: Secondary | ICD-10-CM | POA: Diagnosis not present

## 2018-10-11 DIAGNOSIS — L4050 Arthropathic psoriasis, unspecified: Secondary | ICD-10-CM | POA: Diagnosis not present

## 2018-10-11 DIAGNOSIS — Z7982 Long term (current) use of aspirin: Secondary | ICD-10-CM | POA: Diagnosis not present

## 2018-10-11 DIAGNOSIS — G8929 Other chronic pain: Secondary | ICD-10-CM | POA: Diagnosis not present

## 2018-10-11 DIAGNOSIS — M5442 Lumbago with sciatica, left side: Secondary | ICD-10-CM | POA: Diagnosis not present

## 2018-10-11 DIAGNOSIS — E1151 Type 2 diabetes mellitus with diabetic peripheral angiopathy without gangrene: Secondary | ICD-10-CM | POA: Diagnosis not present

## 2018-10-11 DIAGNOSIS — Z950 Presence of cardiac pacemaker: Secondary | ICD-10-CM | POA: Diagnosis not present

## 2018-10-11 DIAGNOSIS — M5441 Lumbago with sciatica, right side: Secondary | ICD-10-CM | POA: Diagnosis not present

## 2018-10-11 DIAGNOSIS — Z7984 Long term (current) use of oral hypoglycemic drugs: Secondary | ICD-10-CM | POA: Diagnosis not present

## 2018-10-11 DIAGNOSIS — R202 Paresthesia of skin: Secondary | ICD-10-CM | POA: Diagnosis not present

## 2018-10-11 DIAGNOSIS — R42 Dizziness and giddiness: Secondary | ICD-10-CM | POA: Diagnosis not present

## 2018-10-11 DIAGNOSIS — Z9181 History of falling: Secondary | ICD-10-CM | POA: Diagnosis not present

## 2018-10-11 DIAGNOSIS — D649 Anemia, unspecified: Secondary | ICD-10-CM | POA: Diagnosis not present

## 2018-10-11 DIAGNOSIS — E1169 Type 2 diabetes mellitus with other specified complication: Secondary | ICD-10-CM | POA: Diagnosis not present

## 2018-10-11 DIAGNOSIS — I442 Atrioventricular block, complete: Secondary | ICD-10-CM | POA: Diagnosis not present

## 2018-10-13 DIAGNOSIS — Z7982 Long term (current) use of aspirin: Secondary | ICD-10-CM | POA: Diagnosis not present

## 2018-10-13 DIAGNOSIS — D649 Anemia, unspecified: Secondary | ICD-10-CM | POA: Diagnosis not present

## 2018-10-13 DIAGNOSIS — G8929 Other chronic pain: Secondary | ICD-10-CM | POA: Diagnosis not present

## 2018-10-13 DIAGNOSIS — Z9181 History of falling: Secondary | ICD-10-CM | POA: Diagnosis not present

## 2018-10-13 DIAGNOSIS — E1169 Type 2 diabetes mellitus with other specified complication: Secondary | ICD-10-CM | POA: Diagnosis not present

## 2018-10-13 DIAGNOSIS — E782 Mixed hyperlipidemia: Secondary | ICD-10-CM | POA: Diagnosis not present

## 2018-10-13 DIAGNOSIS — L4050 Arthropathic psoriasis, unspecified: Secondary | ICD-10-CM | POA: Diagnosis not present

## 2018-10-13 DIAGNOSIS — R42 Dizziness and giddiness: Secondary | ICD-10-CM | POA: Diagnosis not present

## 2018-10-13 DIAGNOSIS — M5442 Lumbago with sciatica, left side: Secondary | ICD-10-CM | POA: Diagnosis not present

## 2018-10-13 DIAGNOSIS — R202 Paresthesia of skin: Secondary | ICD-10-CM | POA: Diagnosis not present

## 2018-10-13 DIAGNOSIS — Z7984 Long term (current) use of oral hypoglycemic drugs: Secondary | ICD-10-CM | POA: Diagnosis not present

## 2018-10-13 DIAGNOSIS — M5441 Lumbago with sciatica, right side: Secondary | ICD-10-CM | POA: Diagnosis not present

## 2018-10-13 DIAGNOSIS — Z950 Presence of cardiac pacemaker: Secondary | ICD-10-CM | POA: Diagnosis not present

## 2018-10-13 DIAGNOSIS — I442 Atrioventricular block, complete: Secondary | ICD-10-CM | POA: Diagnosis not present

## 2018-10-13 DIAGNOSIS — E1151 Type 2 diabetes mellitus with diabetic peripheral angiopathy without gangrene: Secondary | ICD-10-CM | POA: Diagnosis not present

## 2018-10-17 DIAGNOSIS — R202 Paresthesia of skin: Secondary | ICD-10-CM | POA: Diagnosis not present

## 2018-10-17 DIAGNOSIS — E782 Mixed hyperlipidemia: Secondary | ICD-10-CM | POA: Diagnosis not present

## 2018-10-17 DIAGNOSIS — M5441 Lumbago with sciatica, right side: Secondary | ICD-10-CM | POA: Diagnosis not present

## 2018-10-17 DIAGNOSIS — Z9181 History of falling: Secondary | ICD-10-CM | POA: Diagnosis not present

## 2018-10-17 DIAGNOSIS — Z7984 Long term (current) use of oral hypoglycemic drugs: Secondary | ICD-10-CM | POA: Diagnosis not present

## 2018-10-17 DIAGNOSIS — G8929 Other chronic pain: Secondary | ICD-10-CM | POA: Diagnosis not present

## 2018-10-17 DIAGNOSIS — R42 Dizziness and giddiness: Secondary | ICD-10-CM | POA: Diagnosis not present

## 2018-10-17 DIAGNOSIS — M5442 Lumbago with sciatica, left side: Secondary | ICD-10-CM | POA: Diagnosis not present

## 2018-10-17 DIAGNOSIS — Z7982 Long term (current) use of aspirin: Secondary | ICD-10-CM | POA: Diagnosis not present

## 2018-10-17 DIAGNOSIS — E1151 Type 2 diabetes mellitus with diabetic peripheral angiopathy without gangrene: Secondary | ICD-10-CM | POA: Diagnosis not present

## 2018-10-17 DIAGNOSIS — L4050 Arthropathic psoriasis, unspecified: Secondary | ICD-10-CM | POA: Diagnosis not present

## 2018-10-17 DIAGNOSIS — I442 Atrioventricular block, complete: Secondary | ICD-10-CM | POA: Diagnosis not present

## 2018-10-17 DIAGNOSIS — E1169 Type 2 diabetes mellitus with other specified complication: Secondary | ICD-10-CM | POA: Diagnosis not present

## 2018-10-17 DIAGNOSIS — D649 Anemia, unspecified: Secondary | ICD-10-CM | POA: Diagnosis not present

## 2018-10-17 DIAGNOSIS — Z950 Presence of cardiac pacemaker: Secondary | ICD-10-CM | POA: Diagnosis not present

## 2018-10-18 ENCOUNTER — Other Ambulatory Visit: Payer: Self-pay | Admitting: Family Medicine

## 2018-10-19 DIAGNOSIS — I442 Atrioventricular block, complete: Secondary | ICD-10-CM | POA: Diagnosis not present

## 2018-10-19 DIAGNOSIS — R202 Paresthesia of skin: Secondary | ICD-10-CM | POA: Diagnosis not present

## 2018-10-19 DIAGNOSIS — Z7982 Long term (current) use of aspirin: Secondary | ICD-10-CM | POA: Diagnosis not present

## 2018-10-19 DIAGNOSIS — E1151 Type 2 diabetes mellitus with diabetic peripheral angiopathy without gangrene: Secondary | ICD-10-CM | POA: Diagnosis not present

## 2018-10-19 DIAGNOSIS — G8929 Other chronic pain: Secondary | ICD-10-CM | POA: Diagnosis not present

## 2018-10-19 DIAGNOSIS — M5441 Lumbago with sciatica, right side: Secondary | ICD-10-CM | POA: Diagnosis not present

## 2018-10-19 DIAGNOSIS — E782 Mixed hyperlipidemia: Secondary | ICD-10-CM | POA: Diagnosis not present

## 2018-10-19 DIAGNOSIS — L4050 Arthropathic psoriasis, unspecified: Secondary | ICD-10-CM | POA: Diagnosis not present

## 2018-10-19 DIAGNOSIS — D649 Anemia, unspecified: Secondary | ICD-10-CM | POA: Diagnosis not present

## 2018-10-19 DIAGNOSIS — M5442 Lumbago with sciatica, left side: Secondary | ICD-10-CM | POA: Diagnosis not present

## 2018-10-19 DIAGNOSIS — Z950 Presence of cardiac pacemaker: Secondary | ICD-10-CM | POA: Diagnosis not present

## 2018-10-19 DIAGNOSIS — E1169 Type 2 diabetes mellitus with other specified complication: Secondary | ICD-10-CM | POA: Diagnosis not present

## 2018-10-19 DIAGNOSIS — Z7984 Long term (current) use of oral hypoglycemic drugs: Secondary | ICD-10-CM | POA: Diagnosis not present

## 2018-10-19 DIAGNOSIS — R42 Dizziness and giddiness: Secondary | ICD-10-CM | POA: Diagnosis not present

## 2018-10-19 DIAGNOSIS — Z9181 History of falling: Secondary | ICD-10-CM | POA: Diagnosis not present

## 2018-10-20 DIAGNOSIS — I10 Essential (primary) hypertension: Secondary | ICD-10-CM | POA: Diagnosis not present

## 2018-10-20 DIAGNOSIS — E119 Type 2 diabetes mellitus without complications: Secondary | ICD-10-CM | POA: Diagnosis not present

## 2018-10-23 DIAGNOSIS — M5441 Lumbago with sciatica, right side: Secondary | ICD-10-CM | POA: Diagnosis not present

## 2018-10-23 DIAGNOSIS — R42 Dizziness and giddiness: Secondary | ICD-10-CM | POA: Diagnosis not present

## 2018-10-23 DIAGNOSIS — L4050 Arthropathic psoriasis, unspecified: Secondary | ICD-10-CM | POA: Diagnosis not present

## 2018-10-23 DIAGNOSIS — D649 Anemia, unspecified: Secondary | ICD-10-CM | POA: Diagnosis not present

## 2018-10-23 DIAGNOSIS — R202 Paresthesia of skin: Secondary | ICD-10-CM | POA: Diagnosis not present

## 2018-10-23 DIAGNOSIS — Z9181 History of falling: Secondary | ICD-10-CM | POA: Diagnosis not present

## 2018-10-23 DIAGNOSIS — E1169 Type 2 diabetes mellitus with other specified complication: Secondary | ICD-10-CM | POA: Diagnosis not present

## 2018-10-23 DIAGNOSIS — M5442 Lumbago with sciatica, left side: Secondary | ICD-10-CM | POA: Diagnosis not present

## 2018-10-23 DIAGNOSIS — Z950 Presence of cardiac pacemaker: Secondary | ICD-10-CM | POA: Diagnosis not present

## 2018-10-23 DIAGNOSIS — Z7984 Long term (current) use of oral hypoglycemic drugs: Secondary | ICD-10-CM | POA: Diagnosis not present

## 2018-10-23 DIAGNOSIS — E1151 Type 2 diabetes mellitus with diabetic peripheral angiopathy without gangrene: Secondary | ICD-10-CM | POA: Diagnosis not present

## 2018-10-23 DIAGNOSIS — E782 Mixed hyperlipidemia: Secondary | ICD-10-CM | POA: Diagnosis not present

## 2018-10-23 DIAGNOSIS — I442 Atrioventricular block, complete: Secondary | ICD-10-CM | POA: Diagnosis not present

## 2018-10-23 DIAGNOSIS — G8929 Other chronic pain: Secondary | ICD-10-CM | POA: Diagnosis not present

## 2018-10-23 DIAGNOSIS — Z7982 Long term (current) use of aspirin: Secondary | ICD-10-CM | POA: Diagnosis not present

## 2018-10-24 ENCOUNTER — Other Ambulatory Visit: Payer: Self-pay | Admitting: Family Medicine

## 2018-10-25 DIAGNOSIS — G8929 Other chronic pain: Secondary | ICD-10-CM | POA: Diagnosis not present

## 2018-10-25 DIAGNOSIS — M5442 Lumbago with sciatica, left side: Secondary | ICD-10-CM | POA: Diagnosis not present

## 2018-10-25 DIAGNOSIS — E1169 Type 2 diabetes mellitus with other specified complication: Secondary | ICD-10-CM | POA: Diagnosis not present

## 2018-10-25 DIAGNOSIS — Z9181 History of falling: Secondary | ICD-10-CM | POA: Diagnosis not present

## 2018-10-25 DIAGNOSIS — R42 Dizziness and giddiness: Secondary | ICD-10-CM | POA: Diagnosis not present

## 2018-10-25 DIAGNOSIS — L4050 Arthropathic psoriasis, unspecified: Secondary | ICD-10-CM | POA: Diagnosis not present

## 2018-10-25 DIAGNOSIS — R202 Paresthesia of skin: Secondary | ICD-10-CM | POA: Diagnosis not present

## 2018-10-25 DIAGNOSIS — Z7984 Long term (current) use of oral hypoglycemic drugs: Secondary | ICD-10-CM | POA: Diagnosis not present

## 2018-10-25 DIAGNOSIS — M5441 Lumbago with sciatica, right side: Secondary | ICD-10-CM | POA: Diagnosis not present

## 2018-10-25 DIAGNOSIS — E1151 Type 2 diabetes mellitus with diabetic peripheral angiopathy without gangrene: Secondary | ICD-10-CM | POA: Diagnosis not present

## 2018-10-25 DIAGNOSIS — Z7982 Long term (current) use of aspirin: Secondary | ICD-10-CM | POA: Diagnosis not present

## 2018-10-25 DIAGNOSIS — Z950 Presence of cardiac pacemaker: Secondary | ICD-10-CM | POA: Diagnosis not present

## 2018-10-25 DIAGNOSIS — I442 Atrioventricular block, complete: Secondary | ICD-10-CM | POA: Diagnosis not present

## 2018-10-25 DIAGNOSIS — E782 Mixed hyperlipidemia: Secondary | ICD-10-CM | POA: Diagnosis not present

## 2018-10-25 DIAGNOSIS — D649 Anemia, unspecified: Secondary | ICD-10-CM | POA: Diagnosis not present

## 2018-10-25 NOTE — Telephone Encounter (Signed)
Ok to fill 

## 2018-10-31 ENCOUNTER — Ambulatory Visit: Payer: Medicare Other | Admitting: Podiatry

## 2018-10-31 ENCOUNTER — Encounter: Payer: Self-pay | Admitting: Podiatry

## 2018-10-31 DIAGNOSIS — M79675 Pain in left toe(s): Secondary | ICD-10-CM | POA: Diagnosis not present

## 2018-10-31 DIAGNOSIS — D689 Coagulation defect, unspecified: Secondary | ICD-10-CM | POA: Diagnosis not present

## 2018-10-31 DIAGNOSIS — B351 Tinea unguium: Secondary | ICD-10-CM

## 2018-10-31 DIAGNOSIS — M79674 Pain in right toe(s): Secondary | ICD-10-CM | POA: Diagnosis not present

## 2018-10-31 DIAGNOSIS — E1142 Type 2 diabetes mellitus with diabetic polyneuropathy: Secondary | ICD-10-CM

## 2018-10-31 NOTE — Progress Notes (Signed)
Complaint:  Visit Type: Patient returns to my office for continued preventative foot care services. Complaint: Patient states" my nails have grown long and thick and become painful to walk and wear shoes" Patient has been diagnosed with DM with neuropathy.. The patient presents for preventative foot care services. No changes to ROS.  Patient is taking plavix.  Podiatric Exam: Vascular: dorsalis pedis and posterior tibial pulses are palpable bilateral. Capillary return is immediate. Temperature gradient is WNL. Skin turgor WNL  Sensorium: Absent  Semmes Weinstein monofilament test. Normal tactile sensation bilaterally. Nail Exam: Pt has thick disfigured discolored nails with subungual debris noted bilateral entire nail hallux through fifth toenails.  Right hallux nail has subungual hematoma. Ulcer Exam: There is no evidence of ulcer or pre-ulcerative changes or infection. Orthopedic Exam: Muscle tone and strength are WNL. No limitations in general ROM. No crepitus or effusions noted. Foot type and digits show no abnormalities. Bony prominences are unremarkable. Skin: No Porokeratosis. No infection or ulcers  Diagnosis:  Onychomycosis, , Pain in right toe, pain in left toes  Treatment & Plan Procedures and Treatment: Consent by patient was obtained for treatment procedures.   Debridement of mycotic and hypertrophic toenails, 1 through 5 bilateral and clearing of subungual debris. No ulceration, no infection noted.  Return Visit-Office Procedure: Patient instructed to return to the office for a follow up visit 3 months for continued evaluation and treatment.    Gardiner Barefoot DPM

## 2018-11-01 DIAGNOSIS — Z7982 Long term (current) use of aspirin: Secondary | ICD-10-CM | POA: Diagnosis not present

## 2018-11-01 DIAGNOSIS — R202 Paresthesia of skin: Secondary | ICD-10-CM | POA: Diagnosis not present

## 2018-11-01 DIAGNOSIS — Z950 Presence of cardiac pacemaker: Secondary | ICD-10-CM | POA: Diagnosis not present

## 2018-11-01 DIAGNOSIS — M5441 Lumbago with sciatica, right side: Secondary | ICD-10-CM | POA: Diagnosis not present

## 2018-11-01 DIAGNOSIS — D649 Anemia, unspecified: Secondary | ICD-10-CM | POA: Diagnosis not present

## 2018-11-01 DIAGNOSIS — G8929 Other chronic pain: Secondary | ICD-10-CM | POA: Diagnosis not present

## 2018-11-01 DIAGNOSIS — Z7984 Long term (current) use of oral hypoglycemic drugs: Secondary | ICD-10-CM | POA: Diagnosis not present

## 2018-11-01 DIAGNOSIS — I442 Atrioventricular block, complete: Secondary | ICD-10-CM | POA: Diagnosis not present

## 2018-11-01 DIAGNOSIS — R42 Dizziness and giddiness: Secondary | ICD-10-CM | POA: Diagnosis not present

## 2018-11-01 DIAGNOSIS — M5442 Lumbago with sciatica, left side: Secondary | ICD-10-CM | POA: Diagnosis not present

## 2018-11-01 DIAGNOSIS — E1151 Type 2 diabetes mellitus with diabetic peripheral angiopathy without gangrene: Secondary | ICD-10-CM | POA: Diagnosis not present

## 2018-11-01 DIAGNOSIS — E1169 Type 2 diabetes mellitus with other specified complication: Secondary | ICD-10-CM | POA: Diagnosis not present

## 2018-11-01 DIAGNOSIS — Z9181 History of falling: Secondary | ICD-10-CM | POA: Diagnosis not present

## 2018-11-01 DIAGNOSIS — E782 Mixed hyperlipidemia: Secondary | ICD-10-CM | POA: Diagnosis not present

## 2018-11-01 DIAGNOSIS — L4050 Arthropathic psoriasis, unspecified: Secondary | ICD-10-CM | POA: Diagnosis not present

## 2018-11-03 DIAGNOSIS — R202 Paresthesia of skin: Secondary | ICD-10-CM | POA: Diagnosis not present

## 2018-11-03 DIAGNOSIS — L4050 Arthropathic psoriasis, unspecified: Secondary | ICD-10-CM | POA: Diagnosis not present

## 2018-11-03 DIAGNOSIS — Z9181 History of falling: Secondary | ICD-10-CM | POA: Diagnosis not present

## 2018-11-03 DIAGNOSIS — Z950 Presence of cardiac pacemaker: Secondary | ICD-10-CM | POA: Diagnosis not present

## 2018-11-03 DIAGNOSIS — E1169 Type 2 diabetes mellitus with other specified complication: Secondary | ICD-10-CM | POA: Diagnosis not present

## 2018-11-03 DIAGNOSIS — R42 Dizziness and giddiness: Secondary | ICD-10-CM | POA: Diagnosis not present

## 2018-11-03 DIAGNOSIS — E782 Mixed hyperlipidemia: Secondary | ICD-10-CM | POA: Diagnosis not present

## 2018-11-03 DIAGNOSIS — Z7982 Long term (current) use of aspirin: Secondary | ICD-10-CM | POA: Diagnosis not present

## 2018-11-03 DIAGNOSIS — E1151 Type 2 diabetes mellitus with diabetic peripheral angiopathy without gangrene: Secondary | ICD-10-CM | POA: Diagnosis not present

## 2018-11-03 DIAGNOSIS — M5442 Lumbago with sciatica, left side: Secondary | ICD-10-CM | POA: Diagnosis not present

## 2018-11-03 DIAGNOSIS — D649 Anemia, unspecified: Secondary | ICD-10-CM | POA: Diagnosis not present

## 2018-11-03 DIAGNOSIS — Z7984 Long term (current) use of oral hypoglycemic drugs: Secondary | ICD-10-CM | POA: Diagnosis not present

## 2018-11-03 DIAGNOSIS — I442 Atrioventricular block, complete: Secondary | ICD-10-CM | POA: Diagnosis not present

## 2018-11-03 DIAGNOSIS — M5441 Lumbago with sciatica, right side: Secondary | ICD-10-CM | POA: Diagnosis not present

## 2018-11-03 DIAGNOSIS — G8929 Other chronic pain: Secondary | ICD-10-CM | POA: Diagnosis not present

## 2018-11-07 DIAGNOSIS — E782 Mixed hyperlipidemia: Secondary | ICD-10-CM | POA: Diagnosis not present

## 2018-11-07 DIAGNOSIS — E1151 Type 2 diabetes mellitus with diabetic peripheral angiopathy without gangrene: Secondary | ICD-10-CM | POA: Diagnosis not present

## 2018-11-07 DIAGNOSIS — I442 Atrioventricular block, complete: Secondary | ICD-10-CM | POA: Diagnosis not present

## 2018-11-07 DIAGNOSIS — G8929 Other chronic pain: Secondary | ICD-10-CM | POA: Diagnosis not present

## 2018-11-07 DIAGNOSIS — D649 Anemia, unspecified: Secondary | ICD-10-CM | POA: Diagnosis not present

## 2018-11-07 DIAGNOSIS — Z7984 Long term (current) use of oral hypoglycemic drugs: Secondary | ICD-10-CM | POA: Diagnosis not present

## 2018-11-07 DIAGNOSIS — Z950 Presence of cardiac pacemaker: Secondary | ICD-10-CM | POA: Diagnosis not present

## 2018-11-07 DIAGNOSIS — R202 Paresthesia of skin: Secondary | ICD-10-CM | POA: Diagnosis not present

## 2018-11-07 DIAGNOSIS — E1169 Type 2 diabetes mellitus with other specified complication: Secondary | ICD-10-CM | POA: Diagnosis not present

## 2018-11-07 DIAGNOSIS — R42 Dizziness and giddiness: Secondary | ICD-10-CM | POA: Diagnosis not present

## 2018-11-07 DIAGNOSIS — L4050 Arthropathic psoriasis, unspecified: Secondary | ICD-10-CM | POA: Diagnosis not present

## 2018-11-07 DIAGNOSIS — Z9181 History of falling: Secondary | ICD-10-CM | POA: Diagnosis not present

## 2018-11-07 DIAGNOSIS — Z7982 Long term (current) use of aspirin: Secondary | ICD-10-CM | POA: Diagnosis not present

## 2018-11-07 DIAGNOSIS — M5441 Lumbago with sciatica, right side: Secondary | ICD-10-CM | POA: Diagnosis not present

## 2018-11-07 DIAGNOSIS — M5442 Lumbago with sciatica, left side: Secondary | ICD-10-CM | POA: Diagnosis not present

## 2018-11-09 DIAGNOSIS — Z7984 Long term (current) use of oral hypoglycemic drugs: Secondary | ICD-10-CM | POA: Diagnosis not present

## 2018-11-09 DIAGNOSIS — Z950 Presence of cardiac pacemaker: Secondary | ICD-10-CM | POA: Diagnosis not present

## 2018-11-09 DIAGNOSIS — D649 Anemia, unspecified: Secondary | ICD-10-CM | POA: Diagnosis not present

## 2018-11-09 DIAGNOSIS — Z9181 History of falling: Secondary | ICD-10-CM | POA: Diagnosis not present

## 2018-11-09 DIAGNOSIS — I442 Atrioventricular block, complete: Secondary | ICD-10-CM | POA: Diagnosis not present

## 2018-11-09 DIAGNOSIS — E782 Mixed hyperlipidemia: Secondary | ICD-10-CM | POA: Diagnosis not present

## 2018-11-09 DIAGNOSIS — G8929 Other chronic pain: Secondary | ICD-10-CM | POA: Diagnosis not present

## 2018-11-09 DIAGNOSIS — E1169 Type 2 diabetes mellitus with other specified complication: Secondary | ICD-10-CM | POA: Diagnosis not present

## 2018-11-09 DIAGNOSIS — R202 Paresthesia of skin: Secondary | ICD-10-CM | POA: Diagnosis not present

## 2018-11-09 DIAGNOSIS — E1151 Type 2 diabetes mellitus with diabetic peripheral angiopathy without gangrene: Secondary | ICD-10-CM | POA: Diagnosis not present

## 2018-11-09 DIAGNOSIS — M5442 Lumbago with sciatica, left side: Secondary | ICD-10-CM | POA: Diagnosis not present

## 2018-11-09 DIAGNOSIS — R42 Dizziness and giddiness: Secondary | ICD-10-CM | POA: Diagnosis not present

## 2018-11-09 DIAGNOSIS — M5441 Lumbago with sciatica, right side: Secondary | ICD-10-CM | POA: Diagnosis not present

## 2018-11-09 DIAGNOSIS — Z7982 Long term (current) use of aspirin: Secondary | ICD-10-CM | POA: Diagnosis not present

## 2018-11-09 DIAGNOSIS — L4050 Arthropathic psoriasis, unspecified: Secondary | ICD-10-CM | POA: Diagnosis not present

## 2018-11-14 DIAGNOSIS — Z45018 Encounter for adjustment and management of other part of cardiac pacemaker: Secondary | ICD-10-CM | POA: Diagnosis not present

## 2018-11-14 DIAGNOSIS — Z950 Presence of cardiac pacemaker: Secondary | ICD-10-CM | POA: Diagnosis not present

## 2018-11-14 DIAGNOSIS — I442 Atrioventricular block, complete: Secondary | ICD-10-CM | POA: Diagnosis not present

## 2018-11-17 DIAGNOSIS — D649 Anemia, unspecified: Secondary | ICD-10-CM

## 2018-11-17 DIAGNOSIS — E782 Mixed hyperlipidemia: Secondary | ICD-10-CM

## 2018-11-17 DIAGNOSIS — Z6836 Body mass index (BMI) 36.0-36.9, adult: Secondary | ICD-10-CM

## 2018-11-17 DIAGNOSIS — E669 Obesity, unspecified: Secondary | ICD-10-CM

## 2018-11-17 DIAGNOSIS — L4050 Arthropathic psoriasis, unspecified: Secondary | ICD-10-CM

## 2018-11-17 DIAGNOSIS — Z9181 History of falling: Secondary | ICD-10-CM

## 2018-11-17 DIAGNOSIS — E1169 Type 2 diabetes mellitus with other specified complication: Secondary | ICD-10-CM

## 2018-11-17 DIAGNOSIS — G8929 Other chronic pain: Secondary | ICD-10-CM | POA: Diagnosis not present

## 2018-11-17 DIAGNOSIS — R42 Dizziness and giddiness: Secondary | ICD-10-CM | POA: Diagnosis not present

## 2018-11-17 DIAGNOSIS — Z7984 Long term (current) use of oral hypoglycemic drugs: Secondary | ICD-10-CM

## 2018-11-17 DIAGNOSIS — Z950 Presence of cardiac pacemaker: Secondary | ICD-10-CM

## 2018-11-17 DIAGNOSIS — E1151 Type 2 diabetes mellitus with diabetic peripheral angiopathy without gangrene: Secondary | ICD-10-CM | POA: Diagnosis not present

## 2018-11-17 DIAGNOSIS — R32 Unspecified urinary incontinence: Secondary | ICD-10-CM

## 2018-11-17 DIAGNOSIS — I442 Atrioventricular block, complete: Secondary | ICD-10-CM

## 2018-11-17 DIAGNOSIS — M5442 Lumbago with sciatica, left side: Secondary | ICD-10-CM | POA: Diagnosis not present

## 2018-11-17 DIAGNOSIS — Z7982 Long term (current) use of aspirin: Secondary | ICD-10-CM

## 2018-11-17 DIAGNOSIS — M5441 Lumbago with sciatica, right side: Secondary | ICD-10-CM | POA: Diagnosis not present

## 2018-11-17 DIAGNOSIS — R202 Paresthesia of skin: Secondary | ICD-10-CM

## 2018-11-23 ENCOUNTER — Ambulatory Visit: Payer: Medicare Other | Admitting: Cardiology

## 2018-11-23 ENCOUNTER — Other Ambulatory Visit: Payer: Self-pay

## 2018-11-23 ENCOUNTER — Encounter: Payer: Self-pay | Admitting: Cardiology

## 2018-11-23 VITALS — BP 163/72 | HR 73 | Ht 63.0 in | Wt 212.1 lb

## 2018-11-23 DIAGNOSIS — I10 Essential (primary) hypertension: Secondary | ICD-10-CM

## 2018-11-23 DIAGNOSIS — I442 Atrioventricular block, complete: Secondary | ICD-10-CM | POA: Diagnosis not present

## 2018-11-23 DIAGNOSIS — E7800 Pure hypercholesterolemia, unspecified: Secondary | ICD-10-CM | POA: Diagnosis not present

## 2018-11-23 DIAGNOSIS — K219 Gastro-esophageal reflux disease without esophagitis: Secondary | ICD-10-CM

## 2018-11-23 DIAGNOSIS — Z950 Presence of cardiac pacemaker: Secondary | ICD-10-CM

## 2018-11-23 MED ORDER — OMEPRAZOLE 20 MG PO CPDR: 20.0000 mg | DELAYED_RELEASE_CAPSULE | Freq: Every day | ORAL | 3 refills | Status: DC

## 2018-11-23 NOTE — Progress Notes (Signed)
Subjective:  Primary Physician:  Briscoe Deutscher, DO  Patient ID: Krista Clarke, female    DOB: 1941/05/30, 78 y.o.   MRN: [id number]  Chief Complaint  Patient presents with  . PAD  . Hypertension  . Chest Pain    HPI: Krista Clarke  is a 78 y.o. female  with  second-degree AV block and complete heart and S/P St. Jude permanent pacemaker implantation on 12/19/2014, hypertension, hyperlipidemia, uncontrolled type II diabetes with stage to chronic kidney disease and obesity is here on annual visit and follow-up of hypertension and also pacemaker follow-up, I made the visit short in view of COVID multiple complaints.  Patient states that over the past one month, she has been having episodes of burning sensation in the chest lasting several hours, sometimes a whole day.  No other associated symptoms.  No radiation of chest pain.  He also has chronic dyspnea and states that it is stable, also has chronic leg edema and tries to keep her foot and legs elevated when she is resting. Past Medical History:  Diagnosis Date  . Anemia    takes iron daily  . Arthritis   . Diabetes mellitus without complication ([place])   . Hypercholesteremia   . Hypertension     Past Surgical History:  Procedure Laterality Date  . ABDOMINAL HYSTERECTOMY    . COLONOSCOPY WITH PROPOFOL N/A 07/03/2013   Procedure: COLONOSCOPY WITH PROPOFOL;  Surgeon: Garlan Fair, MD;  Location: [facility];  Service: Endoscopy;  Laterality: N/A;  . EP IMPLANTABLE DEVICE N/A 08/20/2015   Procedure: Pacemaker Implant;  Surgeon: Evans Lance, MD;  Location: [facility];  Service: Cardiovascular;  Laterality: N/A;  . JOINT REPLACEMENT Bilateral    '99. Knees  . LEFT HEART CATHETERIZATION WITH CORONARY ANGIOGRAM N/A 08/27/2014   Procedure: LEFT HEART CATHETERIZATION WITH CORONARY ANGIOGRAM;  Surgeon: Laverda Page, MD;  Location: [facility] CATH LAB;  Service: Cardiovascular;  Laterality: N/A;  . TUBAL LIGATION      Social  History   Socioeconomic History  . Marital status: Widowed    Spouse name: Not on file  . Number of children: 5  . Years of education: Not on file  . Highest education level: Not on file  Occupational History  . Occupation: Retired    . Smoking status: Never Smoker  . Smokeless tobacco: Never Used    Current Outpatient Medications on File Prior to Visit  Medication Sig Dispense Refill  . allopurinol (ZYLOPRIM) 100 MG tablet Take 1 tablet (100 mg total) by mouth every morning. 90 tablet 1  . amLODipine (NORVASC) 10 MG tablet Take 1 tablet (10 mg total) by mouth daily. 90 tablet 1  . ammonium lactate (LAC-HYDRIN) 12 % cream Apply topically as needed for dry skin. 385 g 0  . aspirin 81 MG chewable tablet Chew 1 tablet (81 mg total) by mouth daily. 90 tablet 3  . atorvastatin (LIPITOR) 40 MG tablet Take 1 tablet (40 mg total) by mouth daily. 90 tablet 3  . calcium citrate-vitamin D (CALCIUM CITRATE + D) 315-200 MG-UNIT tablet Take 1 tablet by mouth daily. 90 tablet 3  . ferrous sulfate 325 (65 FE) MG EC tablet Take 1 tablet (325 mg total) by mouth 2 (two) times daily. 90 tablet 3  . furosemide (LASIX) 20 MG tablet TAKE 1 TABLET BY MOUTH EVERY DAY 30 tablet 2  . gabapentin (NEURONTIN) 300 MG capsule Take 1 capsule (300 mg total) by mouth 3 (  three) times daily. (Patient taking differently: Take 300 mg by mouth 3 (three) times daily. 2 tablets) 90 capsule 3  . HYDROcodone-acetaminophen (NORCO/VICODIN) 5-325 MG tablet TAKE 1 TABLET BY MOUTH 4 TIMES DAILY AS NEEDED FOR CHRONIC PAIN 120 tablet 0  . losartan (COZAAR) 50 MG tablet Take 1 tablet (50 mg total) by mouth daily. 90 tablet 3  . Lutein 20 MG TABS Take 1 tablet (20 mg total) by mouth daily. 90 tablet 3  . magnesium oxide (MAG-OX) 400 MG tablet Take 1 tablet (400 mg total) by mouth daily. 90 tablet 3  . metFORMIN (GLUCOPHAGE) 1000 MG tablet Take 1 tablet (1,000 mg total) by mouth 2 (two) times daily with a meal. 60 tablet 11  . metoprolol  tartrate (LOPRESSOR) 25 MG tablet Take 1 tablet (25 mg total) by mouth 2 (two) times daily. 90 tablet 2  . potassium chloride (K-DUR) 10 MEQ tablet TAKE 2 TABLETS BY MOUTH 2 TIMES DAILY 90 tablet 1  . vitamin C (ASCORBIC ACID) 500 MG tablet Take 1 tablet (500 mg total) by mouth daily. 90 tablet 0   No current facility-administered medications on file prior to visit.     Review of Systems  Constitutional: Negative for malaise/fatigue and weight loss.  Respiratory: Negative for cough, hemoptysis and shortness of breath.   Cardiovascular: Positive for chest pain and leg swelling. Negative for palpitations and claudication.  Gastrointestinal: Negative for abdominal pain, blood in stool, constipation, heartburn and vomiting.  Genitourinary: Negative for dysuria.  Musculoskeletal: Positive for back pain and joint pain. Negative for falls and myalgias.       Walks with cane and trouble walking  Neurological: Negative for dizziness, focal weakness and headaches.  Endo/Heme/Allergies: Does not bruise/bleed easily.  Psychiatric/Behavioral: Negative for depression. The patient is not nervous/anxious.   All other systems reviewed and are negative.      Objective:  Blood pressure (!) 163/72, pulse 73, height '5\' 3"'  (1.6 m), weight 212 lb 1.6 oz (96.2 kg), SpO2 97 %. Body mass index is 37.57 kg/m.  Physical Exam  Constitutional: She appears well-developed. No distress.  Morbidly obese  HENT:  Head: Atraumatic.  Eyes: Conjunctivae are normal.  Neck: Neck supple. No thyromegaly present.  Short neck and difficult to evaluate JVP  Cardiovascular: Normal rate, regular rhythm and normal heart sounds. Exam reveals no gallop.  No murmur heard. Pulses:      Carotid pulses are 2+ on the right side and 2+ on the left side.      Dorsalis pedis pulses are 1+ on the right side and 1+ on the left side.       Posterior tibial pulses are 0 on the right side and 0 on the left side.  Femoral and popliteal  pulse difficult to feel due to patient's body habitus.   Pulmonary/Chest: Effort normal and breath sounds normal.  Pacemaker pocket in the left infraclavicular region  Abdominal: Soft. Bowel sounds are normal.  Obese. Pannus present  Musculoskeletal: Normal range of motion.        General: Edema (trace pitting bilateral leg edema) present.  Neurological: She is alert.  Skin: Skin is warm and dry.  Psychiatric: She has a normal mood and affect.    CARDIAC STUDIES:   Echocardiogram [12/11/2014]:  1. Left ventricle cavity is normal in size. Mild concentric hypertrophy of the left ventricle. Normal global wall motion. Doppler evidence of grade II (pseudonormal) diastolic dysfunction. Calculated EF 63%. 2. Trace mitral regurgitation. Mild calcification of  the mitral valve annulus. 3. Trace tricuspid regurgitation. IVC is normal with blunted respiratory response.  Lower Extremity Arterial Duplex [04/29/2016]:  There is mild arterial occlusive disease. The above study suggests mild right iliac arterial disease and mild left distal popliteal or tibial disease. Right ABI 1.06, left ABI of 1.07    Coronary angiogram 08/27/2014:  Normal coronary arteries. Severe tortuosity, suggests hypertensive heart disease. Normal LVEF.  Nuclear stress test [07/16/2013]:  1. Resting EKG NSR, LBBB. Stress EKG was non diagnostic for ischemia. No ADDITIONAL ST-T changes of ischemia noted with pharmacologic stress testing. Stress symptoms included lightheadedness, headache and chest pressure. Stress terminated due to completion of protocol. 2. The perfusion study demonstrated a very prominent breast attenuation artifact in the inferior, inferoseptal region, large size. Left ventricular ejection fraction was estimated to be 78%. This appearance is low risk study. Please see above for explanation.  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 A paced >10%, V paced 99%. No high rates. Underlying sinus with V escape at 30bpm. Pacemaker dependent. Battery life > 5 years. Normal pacemaker function.  Scheduled remote pacemaker transmission 08/16/2017: Longevity >5 years. AP >20%, VP >99%. No AMS. Normal pacemaker function..  Laboratory exam: Labs 10/04/2017: A1c 8.4%. Potassium 4.4, BUN 24, creatinine 0.97, eGFR greater than 60 mL, serum glucose 161 mg. HB 10.0/HCT 31.8, platelets 260, normal indicis.  Labs 03/25/2017: Total cholesterol 182, triglycerides 113, HDL 55, LDL 44.  Recommendation:  Patient is here on annual visit, chest pain symptoms are clearly suggestive of GERD, patient is on dual antiplatelet therapy for mildly abnormal ABI in the past, we will discontinue this and continue aspirin only.  I also given her a prescription for omeprazole to be taken for 2 weeks and then take it on a p.r.n. basis for heartburn.  Blood pressure is very high today, on questioning patient states that she has been taking her medications regularly and blood pressure has been well controlled at home, this morning she did not want to take the medication as it causes her to have significant diuresis.  Hence I did not make any changes to her medications.  Her pacemaker is functioning normally.  She does have underlying complete heart block, she is pacemaker dependent.  Her lipids have been controlled on her last office visit, no being managed by PCP, continue present statin dose.  We have discussed regarding calorie restriction again and weight loss.  Leg edema is related to chronic diastolic heart failure and also sedentary lifestyle.  I would like to see  her back in one month for in percent pacemaker check as it be one year.  She'll continue remote monitoring.   Adrian Prows, MD, [facility] 11/23/2018, 1:07 PM [facility] [place] Pager: [phone] Office: [phone] If no answer Cell [phone]

## 2018-12-03 ENCOUNTER — Telehealth: Payer: Self-pay | Admitting: Cardiology

## 2018-12-05 NOTE — Telephone Encounter (Signed)
Pacemaker function normal

## 2018-12-07 ENCOUNTER — Telehealth: Payer: Self-pay

## 2018-12-07 ENCOUNTER — Other Ambulatory Visit: Payer: Self-pay

## 2018-12-07 DIAGNOSIS — R600 Localized edema: Secondary | ICD-10-CM

## 2018-12-07 MED ORDER — POTASSIUM CHLORIDE ER 10 MEQ PO TBCR: EXTENDED_RELEASE_TABLET | ORAL | 1 refills | Status: DC

## 2018-12-07 MED ORDER — FUROSEMIDE 20 MG PO TABS: 20.0000 mg | ORAL_TABLET | Freq: Every day | ORAL | 1 refills | Status: DC

## 2018-12-07 NOTE — Telephone Encounter (Signed)
Faxed form received from [facility] requesting refill on diabetic testing supplies. Complete form and fax to [phone]

## 2018-12-07 NOTE — Telephone Encounter (Signed)
Last OV 09/26/2018 Last refill K-Dur 10/18/2018 #90/1                 Lasix 09/26/2018 #30/2 Next OV 12/26/2018

## 2018-12-07 NOTE — Telephone Encounter (Signed)
Forms completed and faxed.  

## 2018-12-22 ENCOUNTER — Other Ambulatory Visit: Payer: Self-pay | Admitting: Family Medicine

## 2018-12-22 NOTE — Telephone Encounter (Signed)
Last OV 09/26/18 Last refill 10/25/18 #120/0 Next OV 12/26/18

## 2018-12-26 ENCOUNTER — Encounter: Payer: Self-pay | Admitting: Family Medicine

## 2018-12-26 ENCOUNTER — Ambulatory Visit (INDEPENDENT_AMBULATORY_CARE_PROVIDER_SITE_OTHER): Payer: Medicare Other | Admitting: Family Medicine

## 2018-12-26 ENCOUNTER — Other Ambulatory Visit: Payer: Self-pay

## 2018-12-26 VITALS — BP 118/74 | HR 65 | Ht 63.0 in | Wt 197.0 lb

## 2018-12-26 DIAGNOSIS — E1169 Type 2 diabetes mellitus with other specified complication: Secondary | ICD-10-CM

## 2018-12-26 DIAGNOSIS — M5441 Lumbago with sciatica, right side: Secondary | ICD-10-CM

## 2018-12-26 DIAGNOSIS — I152 Hypertension secondary to endocrine disorders: Secondary | ICD-10-CM

## 2018-12-26 DIAGNOSIS — E669 Obesity, unspecified: Secondary | ICD-10-CM

## 2018-12-26 DIAGNOSIS — I10 Essential (primary) hypertension: Secondary | ICD-10-CM | POA: Diagnosis not present

## 2018-12-26 DIAGNOSIS — E1159 Type 2 diabetes mellitus with other circulatory complications: Secondary | ICD-10-CM

## 2018-12-26 DIAGNOSIS — G8929 Other chronic pain: Secondary | ICD-10-CM

## 2018-12-26 DIAGNOSIS — M5442 Lumbago with sciatica, left side: Secondary | ICD-10-CM

## 2018-12-26 DIAGNOSIS — E785 Hyperlipidemia, unspecified: Secondary | ICD-10-CM

## 2018-12-26 NOTE — Progress Notes (Signed)
Virtual Visit via Video   Due to the COVID-19 pandemic, this visit was completed with telemedicine (audio/video) technology to reduce patient and provider exposure as well as to preserve personal protective equipment.   I connected with Krista Clarke by a video enabled telemedicine application and verified that I am speaking with the correct person using two identifiers. Location patient: Home Location provider: [facility], Office Persons participating in the virtual visit: [place] MAUDRY ZEIDAN, Briscoe Deutscher, DO [name], CMA acting as scribe for Dr. Briscoe Deutscher.   I discussed the limitations of evaluation and management by telemedicine and the availability of in person appointments. The patient expressed understanding and agreed to proceed.  Care Team   Patient Care Team: Briscoe Deutscher, DO as PCP - General (Family Medicine) Adrian Prows, MD as Consulting Physician (Cardiology) [facility]ists, [place] as Consulting Physician (Orthopedic Surgery) Melissa Noon, [place] as Referring Physician (Optometry)  Subjective:   HPI: Patient in for refill. She was not able to finish the Physical therapy due to Covid. She did feel like the therapy was helping a lot.  She will call office when she is ready and we will send in new note.   Her blood sugar this morning fasting was 116. She has been staying home alone. She has been doing a lot of reading.   She has started getting the pill packs from [facility]. She loves how easy it makes medications.  Labs will be done at next visit.    Patient is taking Norco 5-325. She feels like 4 a day is controlling her pain well. She is not having any constipation from this.   Review of Systems  Constitutional: Negative for chills and fever.  HENT: Negative for hearing loss.   Eyes: Negative for blurred vision.  Respiratory: Negative for cough.   Cardiovascular: Negative for chest pain.  Gastrointestinal: Negative for heartburn.  Genitourinary:  Negative for dysuria.  Musculoskeletal: Negative for myalgias.  Skin: Negative for rash.  Neurological: Negative for dizziness.  Endo/Heme/Allergies: Does not bruise/bleed easily.  Psychiatric/Behavioral: Negative for depression.    Patient Active Problem List   Diagnosis Date Noted  . Urinary incontinence in female, followed by GYN, did not tolerate pessary 06/27/2018  . LBBB (left bundle branch block) 11/18/2016  . Pacemaker 11/18/2016  . PAD (peripheral artery disease) ([place]) 11/18/2016  . 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 Chronic midline low back pain with bilateral sciatica, stable on Norco for breakthrough pain 10/02/2016  . Paresthesia of both lower extremities 10/02/2016  . Morbid obesity ([place]) 10/02/2016  . Hyperlipidemia associated with type 2 diabetes mellitus ([place]), on Lipitor 10/02/2016  . Hypertension associated with diabetes ([place]), on Norvasc, Hyzaar, Lasix/K, Metoprolol, followed by Cardiology 03/08/2016  . Microcytic anemia 03/08/2016  . Diabetes mellitus type 2 in obese ([place]), on Metformin 03/08/2016  . Complete heart block ([place]) 08/20/2015  . Angina pectoris ([place]) 08/26/2014    Social History   Tobacco Use  . Smoking status: Never Smoker  . Smokeless tobacco: Never Used  Substance Use Topics  . Alcohol use: No    Current Outpatient Medications:  .  allopurinol (ZYLOPRIM) 100 MG tablet, Take 1 tablet (100 mg total) by mouth every morning., Disp: 90 tablet, Rfl: 1 .  amLODipine (NORVASC) 10 MG tablet, Take 1 tablet (10 mg total) by mouth daily., Disp: 90 tablet, Rfl: 1 .  ammonium lactate (LAC-HYDRIN) 12 % cream, Apply topically as needed for dry skin., Disp: 385 g, Rfl: 0 .  aspirin 81 MG chewable tablet, Chew 1 tablet (81 mg total) by mouth daily., Disp: 90 tablet, Rfl: 3 .  atorvastatin (LIPITOR) 40 MG tablet, Take 1 tablet (40 mg total) by mouth daily., Disp: 90 tablet, Rfl: 3 .  calcium citrate-vitamin D (CALCIUM CITRATE + D) 315-200 MG-UNIT  tablet, Take 1 tablet by mouth daily., Disp: 90 tablet, Rfl: 3 .  clopidogrel (PLAVIX) 75 MG tablet, Take 75 mg by mouth daily., Disp: , Rfl:  .  ferrous sulfate 325 (65 FE) MG EC tablet, Take 1 tablet (325 mg total) by mouth 2 (two) times daily., Disp: 90 tablet, Rfl: 3 .  furosemide (LASIX) 20 MG tablet, Take 1 tablet (20 mg total) by mouth daily., Disp: 90 tablet, Rfl: 1 .  gabapentin (NEURONTIN) 300 MG capsule, Take 1 capsule (300 mg total) by mouth 3 (three) times daily. (Patient taking differently: Take 300 mg by mouth 3 (three) times daily. 2 tablets), Disp: 90 capsule, Rfl: 3 .  HYDROcodone-acetaminophen (NORCO/VICODIN) 5-325 MG tablet, TAKE 1 TABLET BY MOUTH 4 TIMES DAILY AS NEEDED FOR CHRONIC PAIN, Disp: 120 tablet, Rfl: 0 .  losartan (COZAAR) 50 MG tablet, Take 1 tablet (50 mg total) by mouth daily., Disp: 90 tablet, Rfl: 3 .  Lutein 20 MG CAPS, Take 20 mg by mouth daily., Disp: , Rfl:  .  Lutein 20 MG TABS, Take 1 tablet (20 mg total) by mouth daily., Disp: 90 tablet, Rfl: 3 .  magnesium oxide (MAG-OX) 400 MG tablet, Take 1 tablet (400 mg total) by mouth daily., Disp: 90 tablet, Rfl: 3 .  Magnesium Oxide 400 (240 Mg) MG TABS, Take 1 tablet by mouth daily., Disp: , Rfl:  .  metFORMIN (GLUCOPHAGE) 1000 MG tablet, Take 1 tablet (1,000 mg total) by mouth 2 (two) times daily with a meal., Disp: 60 tablet, Rfl: 11 .  metoprolol tartrate (LOPRESSOR) 25 MG tablet, Take 1 tablet (25 mg total) by mouth 2 (two) times daily., Disp: 90 tablet, Rfl: 2 .  omeprazole (PRILOSEC) 20 MG capsule, Take 1 capsule (20 mg total) by mouth daily before breakfast., Disp: 30 capsule, Rfl: 3 .  potassium chloride (K-DUR) 10 MEQ tablet, TAKE 2 TABLETS BY MOUTH 2 TIMES DAILY, Disp: 360 tablet, Rfl: 1 .  vitamin C (ASCORBIC ACID) 500 MG tablet, Take 1 tablet (500 mg total) by mouth daily., Disp: 90 tablet, Rfl: 0  Allergies  Allergen Reactions  . Prednisone Palpitations and Other (See Comments)    Doesn't work well  for patient    Objective:   VITALS: Per patient if applicable, see vitals. GENERAL: Alert and in no acute distress. CARDIOPULMONARY: No increased WOB. Speaking in clear sentences.. PSYCH: Pleasant and cooperative. Speech normal rate and rhythm. Affect is appropriate. Insight and judgement are appropriate. Attention is focused, linear, and appropriate.  NEURO: Oriented as arrived to appointment on time with no prompting.  wounds, erythema, or cyanosis noted on face or hands.  Depression screen [facility] 2/9 06/28/2018 05/12/2018 04/28/2017  Decreased Interest 0 1 0  Down, Depressed, Hopeless 0 1 0  PHQ - 2 Score 0 2 0  Altered sleeping - 1 -  Tired, decreased energy - 1 -  Change in appetite - 1 -  Feeling bad or failure about yourself  - 1 -  Trouble concentrating - 1 -  Moving slowly or fidgety/restless - 1 -  Suicidal thoughts - 0 -  PHQ-9 Score - 8 -  Difficult doing work/chores - Somewhat difficult -  Assessment and Plan:   Krista Clarke was seen today for follow-up.  Diagnoses and all orders for this visit:  Hypertension associated with diabetes ([place]), on Norvasc, Hyzaar, Lasix/K, Metoprolol, followed by Cardiology  Diabetes mellitus type 2 in obese ([place]), on Metformin  Hyperlipidemia associated with type 2 diabetes mellitus ([place]), on Lipitor  Chronic midline low back pain with bilateral sciatica, stable on Norco for breakthrough pain  Morbid obesity ([place])  Well controlled.  No signs of complications, medication side effects, or red flags.  Continue current regimens.  Marland Kitchen COVID-19 Education:The signs and symptoms of COVID-19 were discussed with the patient and how to seek care for testing if needed. The importance of social distancing was discussed today. . Reviewed expectations re: course of current medical issues. . Discussed self-management of symptoms. . Outlined signs and symptoms indicating need for more acute intervention. . Patient verbalized understanding and all  questions were answered. Marland Kitchen Health Maintenance issues including appropriate healthy diet, exercise, and smoking avoidance were discussed with patient. . See orders for this visit as documented in the electronic medical record.  Briscoe Deutscher, DO 12/30/2018  Records requested if needed. Time spent: 25 minutes, of which >50% was spent in obtaining information about her symptoms, reviewing her previous labs, evaluations, and treatments, counseling her about her condition (please see the discussed topics above), and developing a plan to further investigate it; she had a number of questions which I addressed.

## 2018-12-30 ENCOUNTER — Encounter: Payer: Self-pay | Admitting: Family Medicine

## 2019-01-04 ENCOUNTER — Ambulatory Visit: Payer: Medicare Other | Admitting: Cardiology

## 2019-01-06 ENCOUNTER — Other Ambulatory Visit: Payer: Self-pay | Admitting: Family Medicine

## 2019-01-06 DIAGNOSIS — E1159 Type 2 diabetes mellitus with other circulatory complications: Secondary | ICD-10-CM

## 2019-01-06 DIAGNOSIS — R600 Localized edema: Secondary | ICD-10-CM

## 2019-01-06 DIAGNOSIS — R202 Paresthesia of skin: Secondary | ICD-10-CM

## 2019-01-06 DIAGNOSIS — I152 Hypertension secondary to endocrine disorders: Secondary | ICD-10-CM

## 2019-01-30 ENCOUNTER — Ambulatory Visit: Payer: Medicare Other | Admitting: Podiatry

## 2019-02-06 ENCOUNTER — Other Ambulatory Visit: Payer: Self-pay | Admitting: Family Medicine

## 2019-02-06 NOTE — Telephone Encounter (Signed)
Last OV 12/26/18 Last refill 12/22/18 #120/0 Next OV 03/28/19

## 2019-02-09 ENCOUNTER — Other Ambulatory Visit: Payer: Self-pay | Admitting: Family Medicine

## 2019-02-13 DIAGNOSIS — Z950 Presence of cardiac pacemaker: Secondary | ICD-10-CM

## 2019-02-13 DIAGNOSIS — I442 Atrioventricular block, complete: Secondary | ICD-10-CM

## 2019-02-13 DIAGNOSIS — Z45018 Encounter for adjustment and management of other part of cardiac pacemaker: Secondary | ICD-10-CM

## 2019-02-18 ENCOUNTER — Encounter: Payer: Self-pay | Admitting: Cardiology

## 2019-03-15 ENCOUNTER — Other Ambulatory Visit: Payer: Self-pay | Admitting: Family Medicine

## 2019-03-19 NOTE — Telephone Encounter (Signed)
Last OV 12/26/18 Last refill 02/06/19 #120/0 Next OV 03/28/19

## 2019-03-28 ENCOUNTER — Other Ambulatory Visit: Payer: Self-pay

## 2019-03-28 ENCOUNTER — Encounter: Payer: Self-pay | Admitting: Family Medicine

## 2019-03-28 ENCOUNTER — Ambulatory Visit (INDEPENDENT_AMBULATORY_CARE_PROVIDER_SITE_OTHER): Payer: Medicare Other | Admitting: Family Medicine

## 2019-03-28 VITALS — HR 88 | Temp 98.2°F | Ht 63.0 in | Wt 208.4 lb

## 2019-03-28 DIAGNOSIS — E669 Obesity, unspecified: Secondary | ICD-10-CM

## 2019-03-28 DIAGNOSIS — E1159 Type 2 diabetes mellitus with other circulatory complications: Secondary | ICD-10-CM

## 2019-03-28 DIAGNOSIS — I152 Hypertension secondary to endocrine disorders: Secondary | ICD-10-CM

## 2019-03-28 DIAGNOSIS — Z79899 Other long term (current) drug therapy: Secondary | ICD-10-CM | POA: Diagnosis not present

## 2019-03-28 DIAGNOSIS — E1169 Type 2 diabetes mellitus with other specified complication: Secondary | ICD-10-CM

## 2019-03-28 DIAGNOSIS — E785 Hyperlipidemia, unspecified: Secondary | ICD-10-CM | POA: Diagnosis not present

## 2019-03-28 DIAGNOSIS — D509 Iron deficiency anemia, unspecified: Secondary | ICD-10-CM

## 2019-03-28 DIAGNOSIS — Z0000 Encounter for general adult medical examination without abnormal findings: Secondary | ICD-10-CM | POA: Diagnosis not present

## 2019-03-28 DIAGNOSIS — I10 Essential (primary) hypertension: Secondary | ICD-10-CM

## 2019-03-28 NOTE — Patient Instructions (Signed)
  Ms. Marsch , Thank you for taking time to come for your Medicare Wellness Visit. I appreciate your ongoing commitment to your health goals. Please review the following plan we discussed and let me know if I can assist you in the future.   These are the goals we discussed: Goals    . Exercise 3x per week (30 min per time)       This is a list of the screening recommended for you and due dates:  Health Maintenance  Topic Date Due  . Eye exam for diabetics  01/11/2019  . Hemoglobin A1C  03/27/2019  . Flu Shot  03/31/2019  . Complete foot exam   05/13/2019  . Pneumonia vaccines (2 of 2 - PPSV23) 05/13/2019  . Tetanus Vaccine  06/15/2028  . DEXA scan (bone density measurement)  Completed

## 2019-03-28 NOTE — Progress Notes (Signed)
Subjective:   The patient is here for annual Medicare wellness examination and management of other chronic and acute problems.  . The risk factors are reflected in the social history. . The roster of all physicians providing medical care to patient - is listed in the Snapshot section of the chart. . Activities of daily living:  The patient is 100% independent in all ADLs: dressing, toileting, feeding as well as independent mobility. . Home safety  The patient has smoke detectors in the home. They wear seatbelts.  There are no firearms at home. There is no violence in the home.  . There is no risks for hepatitis, STDs, or HIV. There is no history of blood transfusion. They have no travel history to infectious disease endemic areas of the world. . The patient has seen a dentist in the last six months. They have seen their eye doctor in the last year. They admit to slight hearing difficulty with regard to whispered voices and some television programs.  They have deferred audiologic testing in the last year.  They do not  have excessive sun exposure. Discussed the need for sun protection: hats, long sleeves and use of sunscreen if there is significant sun exposure.  . The importance of a healthy diet is discussed. They do have a healthy diet. . The benefits of regular aerobic exercise were discussed.  . There are no signs or vegative symptoms of depression, including irritability, change in appetite, anhedonia, sadness/tearfullness. . Cognitive assessment completed and without concerns. . The following portions of the patient's history were reviewed and updated as appropriate: allergies, current medications, past family history, past medical history, past surgical history, past social history  and problem list.  Patient Care Team: Briscoe Deutscher, DO as PCP - General (Family Medicine) Adrian Prows, MD as Consulting Physician (Cardiology) Specialists, [place] as Consulting Physician  (Orthopedic Surgery) Melissa Noon, [place] as Referring Physician (Optometry)  Past medical, surgical, social and family history reviewed and updated:  Patient Active Problem List   Diagnosis Date Noted  . Urinary incontinence in female, followed by GYN, did not tolerate pessary 06/27/2018  . LBBB (left bundle branch block) 11/18/2016  . PAD (peripheral artery disease) ([place]) 11/18/2016  . 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 Chronic midline low back pain with bilateral sciatica, stable on Norco for breakthrough pain 10/02/2016  . Paresthesia of both lower extremities 10/02/2016  . Morbid obesity ([place]) 10/02/2016  . Hyperlipidemia associated with type 2 diabetes mellitus ([place]), on Lipitor 10/02/2016  . Hypertension associated with diabetes ([place]), on Norvasc, Hyzaar, Lasix/K, Metoprolol, followed by Cardiology 03/08/2016  . Microcytic anemia 03/08/2016  . Diabetes mellitus type 2 in obese ([place]), on Metformin 03/08/2016  . Complete heart block ([place]) 08/20/2015  . 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, CHB 08/20/2015  . Angina pectoris ([place]) 08/26/2014   Past Surgical History:  Procedure Laterality Date  . ABDOMINAL HYSTERECTOMY    . COLONOSCOPY WITH PROPOFOL N/A 07/03/2013   Procedure: COLONOSCOPY WITH PROPOFOL;  Surgeon: Garlan Fair, MD;  Location: [facility] END[facility];  Service: Endoscopy;  Laterality: N/A;  . EP IMPLANTABLE DEVICE N/A 08/20/2015   Procedure: Pacemaker Implant;  Surgeon: Evans Lance, MD;  Location: [place] CV LAB;  Service: Cardiovascular;  Laterality: N/A;  . JOINT REPLACEMENT Bilateral    '99. Knees  . LEFT HEART CATHETERIZATION WITH CORONARY ANGIOGRAM N/A 08/27/2014   Procedure: LEFT HEART CATHETERIZATION WITH CORONARY ANGIOGRAM;  Surgeon: Laverda Page, MD;  Location: [facility] LAB;  Service: Cardiovascular;  Laterality: N/A;  . TUBAL LIGATION     Social History   Tobacco Use  . Smoking status: Never Smoker   . Smokeless tobacco: Never Used  Substance Use Topics  . Alcohol use: No   Family History  Problem Relation Age of Onset  . Hypertension Mother   . Diabetes Mother   . Hypertension Father   . Cancer Brother    Social History   Socioeconomic History  . Marital status: Widowed    Spouse name: Not on file  . Number of children: 5  . Years of education: Not on file  . Highest education level: Not on file  Occupational History  . Occupation: Retired   Scientific laboratory technician  . Financial resource strain: Not on file  . Food insecurity    Worry: Not on file    Inability: Not on file  . Transportation needs    Medical: Not on file    Non-medical: Not on file  Tobacco Use  . Smoking status: Never Smoker  . Smokeless tobacco: Never Used  Substance and Sexual Activity  . Alcohol use: No  . Drug use: No  . Sexual activity: Not Currently  Lifestyle  . Physical activity    Days per week: Not on file    Minutes per session: Not on file  . Stress: Not on file  Relationships  . Social Herbalist on phone: Not on file    Gets together: Not on file    Attends religious service: Not on file    Active member of club or organization: Not on file    Attends meetings of clubs or organizations: Not on file    Relationship status: Not on file  . Intimate partner violence    Fear of current or ex partner: Not on file    Emotionally abused: Not on file    Physically abused: Not on file    Forced sexual activity: Not on file  Other Topics Concern  . Not on file  Social History Narrative  . Not on file   Current medication list and allergy/intolerance information reviewed and updated:     Current Outpatient Medications:  .  allopurinol (ZYLOPRIM) 100 MG tablet, TAKE 1 TABLET BY MOUTH EVERY MORNING, Disp: 90 tablet, Rfl: 1 .  amLODipine (NORVASC) 10 MG tablet, TAKE 1 TABLET BY MOUTH EVERY DAY, Disp: 90 tablet, Rfl: 1 .  ammonium lactate (LAC-HYDRIN) 12 % cream, Apply topically as  needed for dry skin., Disp: 385 g, Rfl: 0 .  aspirin 81 MG chewable tablet, Chew 1 tablet (81 mg total) by mouth daily., Disp: 90 tablet, Rfl: 3 .  atorvastatin (LIPITOR) 40 MG tablet, Take 1 tablet (40 mg total) by mouth daily., Disp: 90 tablet, Rfl: 3 .  calcium citrate-vitamin D (CALCIUM CITRATE + D) 315-200 MG-UNIT tablet, Take 1 tablet by mouth daily., Disp: 90 tablet, Rfl: 3 .  clopidogrel (PLAVIX) 75 MG tablet, Take 75 mg by mouth daily., Disp: , Rfl:  .  FEROSUL 325 (65 Fe) MG tablet, TAKE 1 TABLET BY MOUTH 2 TIMES DAILY, Disp: 90 tablet, Rfl: 1 .  furosemide (LASIX) 20 MG tablet, TAKE 1 TABLET BY MOUTH EVERY DAY, Disp: 90 tablet, Rfl: 1 .  gabapentin (NEURONTIN) 300 MG capsule, TAKE 1 CAPSULE BY MOUTH 3 TIMES DAILY, Disp: 270 capsule, Rfl: 1 .  HYDROcodone-acetaminophen (NORCO/VICODIN) 5-325 MG tablet, TAKE 1 TABLET  BY MOUTH 4 TIMES DAILY AS NEEDED FOR CHRONIC PAIN, Disp: 120 tablet, Rfl: 0 .  losartan (COZAAR) 50 MG tablet, Take 1 tablet (50 mg total) by mouth daily., Disp: 90 tablet, Rfl: 3 .  Lutein 20 MG CAPS, Take 20 mg by mouth daily., Disp: , Rfl:  .  magnesium oxide (MAG-OX) 400 MG tablet, Take 1 tablet (400 mg total) by mouth daily., Disp: 90 tablet, Rfl: 3 .  metFORMIN (GLUCOPHAGE) 1000 MG tablet, Take 1 tablet (1,000 mg total) by mouth 2 (two) times daily with a meal., Disp: 60 tablet, Rfl: 11 .  metoprolol tartrate (LOPRESSOR) 25 MG tablet, Take 1 tablet (25 mg total) by mouth 2 (two) times daily., Disp: 90 tablet, Rfl: 2 .  omeprazole (PRILOSEC) 20 MG capsule, Take 1 capsule (20 mg total) by mouth daily before breakfast., Disp: 30 capsule, Rfl: 3 .  potassium chloride (K-DUR) 10 MEQ tablet, TAKE 2 TABLETS BY MOUTH 2 TIMES DAILY, Disp: 360 tablet, Rfl: 1 .  vitamin C (ASCORBIC ACID) 500 MG tablet, TAKE 1 TABLET BY MOUTH EVERY DAY AT NOON, Disp: 90 tablet, Rfl: 1  Allergies  Allergen Reactions  . Prednisone Palpitations and Other (See Comments)    Doesn't work well for  patient    Review of Systems  Constitutional: Negative for chills, fever, malaise/fatigue and weight loss.  Respiratory: Negative for cough, shortness of breath and wheezing.   Cardiovascular: Negative for chest pain, palpitations and leg swelling.  Gastrointestinal: Negative for abdominal pain, constipation, diarrhea, nausea and vomiting.  Genitourinary: Negative for dysuria and urgency.  Musculoskeletal: Positive for joint pain and myalgias.  Skin: Negative for rash.  Neurological: Negative for dizziness and headaches.  Psychiatric/Behavioral: Negative for depression, substance abuse and suicidal ideas. The patient is not nervous/anxious.    Health Risk Assessment:   CLINICAL INTAKE, INCLUDING ADLS, SENSORY DIFFICULTY  Pain : 0-10 Pain Score: 8  Pain Location: Ankle(Both legs, both feet) Pain Descriptors / Indicators: Aching, Burning Pain Onset: More than a month ago Pain Frequency: Constant  Diabetes: Yes  Activities of Daily Living: Independent Ambulation: Independent with device- listed below Home Assistive Devices/Equipment: Walker (specify Type) Medication Administration: Independent Home Management: Independent  Barriers to Care Management & Learning: None  Do you feel unsafe in your current relationship?: No Do you feel physically threatened by others?: No Anyone hurting you at home, work, or school?: No  How often do you need to have someone help you when you read instructions, pamphlets, or other written materials from your doctor or pharmacy?: 3 - Sometimes  Interpreter Needed?: No  GOALS Goals    . Exercise 3x per week (30 min per time)      FUNCTIONAL STATUS SURVEY, EXERCISE, CARDIAC RISK FACTORS Is the patient deaf or have difficulty hearing?: No Does the patient have difficulty seeing, even when wearing glasses/contacts?: No Does the patient have difficulty concentrating, remembering, or making decisions?: Yes Does the patient have difficulty  walking or climbing stairs?: Yes Does the patient have difficulty dressing or bathing?: No Does the patient have difficulty doing errands alone such as visiting a doctor's office or shopping?: Yes  DEPRESSION QUESTIONNAIRE Depression screen [facility] 2/9 03/28/2019 03/28/2019 06/28/2018  Decreased Interest 0 1 0  Down, Depressed, Hopeless 0 1 0  PHQ - 2 Score 0 2 0  Altered sleeping 1 0 -  Tired, decreased energy 0 0 -  Change in appetite 1 1 -  Feeling bad or failure about yourself  0 0 -  Trouble concentrating 0 1 -  Moving slowly or fidgety/restless 0 0 -  Suicidal thoughts 0 0 -  PHQ-9 Score 2 4 -  Difficult doing work/chores Not difficult at all Not difficult at all -   FALL RISK Fall Risk  03/28/2019 03/28/2019 05/12/2018 04/28/2017 03/25/2017  Falls in the past year? 0 0 No Yes No  Comment - - pt uses walker  - -  Number falls in past yr: - - - 2 or more -  Injury with Fall? - - - No -  Risk Factor Category  - - - High Fall Risk -  Risk for fall due to : - - - Impaired mobility -  Follow up - - - Education provided;Falls prevention discussed -  Comment - - - - -    COGNITIVE FUNCTION MMSE - Mini Mental State Exam 04/28/2017  Orientation to time 5  Orientation to Place 5  Registration 3  Attention/ Calculation 3  Recall 1  Language- name 2 objects 2  Language- repeat 1  Language- follow 3 step command 3  Language- read & follow direction 1  Write a sentence 1  Copy design 1  Total score 26    Objective:   Vitals:   03/28/19 1422  Pulse: 88  Temp: 98.2 F (36.8 C)  SpO2: 94%   Body mass index is 36.92 kg/m.  Physical Exam  Constitutional: She is oriented to person, place, and time. She appears well-developed and well-nourished. No distress.  HENT:  Head: Normocephalic and atraumatic.  Right Ear: External ear normal.  Left Ear: External ear normal.  Mouth/Throat: No oropharyngeal exudate.  Eyes: Pupils are equal, round, and reactive to light. Conjunctivae are  normal.  Neck: Normal range of motion. Neck supple. No JVD present. No thyromegaly present.  Cardiovascular: Normal rate and regular rhythm.  Pulmonary/Chest: Effort normal and breath sounds normal. She has no wheezes. She exhibits no tenderness.  Abdominal: Soft. Bowel sounds are normal. She exhibits no mass. There is no abdominal tenderness. There is no rebound and no guarding.  Musculoskeletal: Normal range of motion.        General: Edema present.  Lymphadenopathy:    She has no cervical adenopathy.  Neurological: She is alert and oriented to person, place, and time. She has normal reflexes.  Skin: No rash noted.  Psychiatric: She has a normal mood and affect. Her behavior is normal. Judgment and thought content normal.    Timed Get Up and Go performed: yes, taking < 12 seconds  Assessment and Plan:   Krista Clarke was seen today for annual exam.  Diagnoses and all orders for this visit:  Medicare annual wellness visit, subsequent  Diabetes mellitus type 2 in obese ([place]), on Metformin -     Comprehensive metabolic panel -     Hemoglobin A1c  Morbid obesity ([place])  Microcytic anemia  Hypertension associated with diabetes ([place]), on Norvasc, Hyzaar, Lasix/K, Metoprolol, followed by Cardiology  Hyperlipidemia associated with type 2 diabetes mellitus ([place]), on Lipitor -     Lipid panel  Medication management -     CBC with Differential/Platelet -     Magnesium -     Vitamin B12 -     TSH   Immunization History  Administered Date(s) Administered  . Influenza, High Dose Seasonal PF 06/30/2017, 05/12/2018  . Pneumococcal Conjugate-13 05/12/2018  . Tdap 06/15/2018   Qualifies for Shingles Vaccine? Yes Note: Contraindications include severe allergic reaction (e.g., anaphylaxis) after a previous dose or  to a vaccine component, known severe immunodeficiency (e.g., from hematologic and solid tumors, receipt of chemotherapy, congenital immunodeficiency,  long-term immunosuppressive  therapy(g) or patients with HIV infection who are severely immunocompromised), and pregnancy.  Screening Tests Health Maintenance  Topic Date Due  . OPHTHALMOLOGY EXAM  01/11/2019  . HEMOGLOBIN A1C  03/27/2019  . INFLUENZA VACCINE  03/31/2019  . FOOT EXAM  05/13/2019  . PNA vac Low Risk Adult (2 of 2 - PPSV23) 05/13/2019  . TETANUS/TDAP  06/15/2028  . DEXA SCAN  Completed   Cancer Screenings: Lung: Low Dose CT Chest recommended if Age 15-80 years, 30 pack-year currently smoking OR have quit w/in 15years. Patient does not qualify.  Advanced Directives has an advanced directive - a copy HAS NOT been provided.  I have personally reviewed and noted the following in the patient's chart:   . Medical and social history . Use of alcohol, tobacco or illicit drugs  . Current medications and supplements . Functional ability and status . Nutritional status . Physical activity . Advanced directives . List of other physicians . Hospitalizations, surgeries, and ER visits in previous 12 months . Vitals . Screenings to include cognitive, depression, and falls . Referrals and appointments  In addition, I have reviewed and discussed with patient certain preventive protocols, quality metrics, and best practice recommendations. A personalized care plan for preventive services as well as general preventive health recommendations were provided to patient.   Briscoe Deutscher, DO

## 2019-03-29 LAB — CBC WITH DIFFERENTIAL/PLATELET
Basophils Absolute: 0.1 10*3/uL (ref 0.0–0.1)
Basophils Relative: 0.8 % (ref 0.0–3.0)
Eosinophils Absolute: 0.1 10*3/uL (ref 0.0–0.7)
Eosinophils Relative: 1.8 % (ref 0.0–5.0)
HCT: 32.5 % — ABNORMAL LOW (ref 36.0–46.0)
Hemoglobin: 10 g/dL — ABNORMAL LOW (ref 12.0–15.0)
Lymphocytes Relative: 23.7 % (ref 12.0–46.0)
Lymphs Abs: 1.6 10*3/uL (ref 0.7–4.0)
MCHC: 31 g/dL (ref 30.0–36.0)
MCV: 80.1 fl (ref 78.0–100.0)
Monocytes Absolute: 0.5 10*3/uL (ref 0.1–1.0)
Monocytes Relative: 7.2 % (ref 3.0–12.0)
Neutro Abs: 4.5 10*3/uL (ref 1.4–7.7)
Neutrophils Relative %: 66.5 % (ref 43.0–77.0)
Platelets: 293 10*3/uL (ref 150.0–400.0)
RBC: 4.05 Mil/uL (ref 3.87–5.11)
RDW: 15.8 % — ABNORMAL HIGH (ref 11.5–15.5)
WBC: 6.8 10*3/uL (ref 4.0–10.5)

## 2019-03-29 LAB — COMPREHENSIVE METABOLIC PANEL
ALT: 10 U/L (ref 0–35)
AST: 12 U/L (ref 0–37)
Albumin: 4.7 g/dL (ref 3.5–5.2)
Alkaline Phosphatase: 62 U/L (ref 39–117)
BUN: 36 mg/dL — ABNORMAL HIGH (ref 6–23)
CO2: 25 mEq/L (ref 19–32)
Calcium: 10.2 mg/dL (ref 8.4–10.5)
Chloride: 104 mEq/L (ref 96–112)
Creatinine, Ser: 1.17 mg/dL (ref 0.40–1.20)
GFR: 53.91 mL/min — ABNORMAL LOW (ref >60.00)
Glucose, Bld: 104 mg/dL — ABNORMAL HIGH (ref 70–99)
Potassium: 4.9 mEq/L (ref 3.5–5.1)
Sodium: 140 mEq/L (ref 135–145)
Total Bilirubin: 0.4 mg/dL (ref 0.2–1.2)
Total Protein: 7.8 g/dL (ref 6.0–8.3)

## 2019-03-29 LAB — HEMOGLOBIN A1C: Hgb A1c MFr Bld: 6.7 % — ABNORMAL HIGH (ref 4.6–6.5)

## 2019-03-29 LAB — MAGNESIUM: Magnesium: 1.7 mg/dL (ref 1.5–2.5)

## 2019-03-29 LAB — LIPID PANEL
Cholesterol: 133 mg/dL (ref 0–200)
HDL: 50.8 mg/dL (ref >39.00)
LDL Cholesterol: 58 mg/dL (ref 0–99)
NonHDL: 81.8
Total CHOL/HDL Ratio: 3
Triglycerides: 120 mg/dL (ref 0.0–149.0)
VLDL: 24 mg/dL (ref 0.0–40.0)

## 2019-03-29 LAB — VITAMIN B12: Vitamin B-12: 253 pg/mL (ref 211–911)

## 2019-03-29 LAB — TSH: TSH: 2.03 u[IU]/mL (ref 0.35–4.50)

## 2019-04-04 ENCOUNTER — Other Ambulatory Visit: Payer: Self-pay | Admitting: Family Medicine

## 2019-04-20 ENCOUNTER — Other Ambulatory Visit: Payer: Self-pay | Admitting: Family Medicine

## 2019-04-23 NOTE — Telephone Encounter (Signed)
See request °

## 2019-04-23 NOTE — Telephone Encounter (Signed)
Pt called and is requesting an update on this prescription. Please advise.

## 2019-05-11 ENCOUNTER — Ambulatory Visit (INDEPENDENT_AMBULATORY_CARE_PROVIDER_SITE_OTHER): Payer: Medicare Other | Admitting: Podiatry

## 2019-05-11 ENCOUNTER — Other Ambulatory Visit: Payer: Self-pay

## 2019-05-11 ENCOUNTER — Encounter: Payer: Self-pay | Admitting: Podiatry

## 2019-05-11 DIAGNOSIS — M2142 Flat foot [pes planus] (acquired), left foot: Secondary | ICD-10-CM

## 2019-05-11 DIAGNOSIS — M2012 Hallux valgus (acquired), left foot: Secondary | ICD-10-CM

## 2019-05-11 DIAGNOSIS — M79674 Pain in right toe(s): Secondary | ICD-10-CM

## 2019-05-11 DIAGNOSIS — D689 Coagulation defect, unspecified: Secondary | ICD-10-CM | POA: Diagnosis not present

## 2019-05-11 DIAGNOSIS — M2011 Hallux valgus (acquired), right foot: Secondary | ICD-10-CM

## 2019-05-11 DIAGNOSIS — E1142 Type 2 diabetes mellitus with diabetic polyneuropathy: Secondary | ICD-10-CM | POA: Diagnosis not present

## 2019-05-11 DIAGNOSIS — B351 Tinea unguium: Secondary | ICD-10-CM

## 2019-05-11 DIAGNOSIS — M2141 Flat foot [pes planus] (acquired), right foot: Secondary | ICD-10-CM

## 2019-05-11 DIAGNOSIS — M2140 Flat foot [pes planus] (acquired), unspecified foot: Secondary | ICD-10-CM | POA: Insufficient documentation

## 2019-05-11 DIAGNOSIS — M79675 Pain in left toe(s): Secondary | ICD-10-CM

## 2019-05-11 NOTE — Progress Notes (Signed)
Complaint:  Visit Type: Patient returns to my office for continued preventative foot care services. Complaint: Patient states" my nails have grown long and thick and become painful to walk and wear shoes" Patient has been diagnosed with DM with neuropathy.. The patient presents for preventative foot care services. No changes to ROS.  Patient is taking plavix.  Podiatric Exam: Vascular: dorsalis pedis and posterior tibial pulses are palpable bilateral. Capillary return is immediate. Temperature gradient is WNL. Skin turgor WNL  Sensorium: Absent  Semmes Weinstein monofilament test. Normal tactile sensation bilaterally. Nail Exam: Pt has thick disfigured discolored nails with subungual debris noted bilateral entire nail hallux through fifth toenails.  Right hallux nail has subungual hematoma. Ulcer Exam: There is no evidence of ulcer or pre-ulcerative changes or infection. Orthopedic Exam: Muscle tone and strength are WNL. No limitations in general ROM. No crepitus or effusions noted. Foot type and digits show no abnormalities. Bony prominences are unremarkable. Skin: No Porokeratosis. No infection or ulcers  Diagnosis:  Onychomycosis, , Pain in right toe, pain in left toes  Treatment & Plan Procedures and Treatment: Consent by patient was obtained for treatment procedures.   Debridement of mycotic and hypertrophic toenails, 1 through 5 bilateral and clearing of subungual debris. No ulceration, no infection noted. Patient qualifies for diabetic shoes per DPN HAV and pes planus. Return Visit-Office Procedure: Patient instructed to return to the office for a follow up visit 3 months for continued evaluation and treatment.    Gardiner Barefoot DPM

## 2019-05-15 DIAGNOSIS — Z45018 Encounter for adjustment and management of other part of cardiac pacemaker: Secondary | ICD-10-CM | POA: Diagnosis not present

## 2019-05-15 DIAGNOSIS — Z950 Presence of cardiac pacemaker: Secondary | ICD-10-CM | POA: Diagnosis not present

## 2019-05-15 DIAGNOSIS — I442 Atrioventricular block, complete: Secondary | ICD-10-CM | POA: Diagnosis not present

## 2019-05-21 ENCOUNTER — Other Ambulatory Visit: Payer: Self-pay

## 2019-05-21 ENCOUNTER — Ambulatory Visit: Payer: Medicare Other | Admitting: Orthotics

## 2019-05-21 DIAGNOSIS — M2141 Flat foot [pes planus] (acquired), right foot: Secondary | ICD-10-CM

## 2019-05-21 DIAGNOSIS — M2012 Hallux valgus (acquired), left foot: Secondary | ICD-10-CM

## 2019-05-21 DIAGNOSIS — E1142 Type 2 diabetes mellitus with diabetic polyneuropathy: Secondary | ICD-10-CM

## 2019-05-21 DIAGNOSIS — M2011 Hallux valgus (acquired), right foot: Secondary | ICD-10-CM

## 2019-05-21 DIAGNOSIS — M2142 Flat foot [pes planus] (acquired), left foot: Secondary | ICD-10-CM

## 2019-05-21 NOTE — Progress Notes (Signed)

## 2019-05-22 DIAGNOSIS — E1159 Type 2 diabetes mellitus with other circulatory complications: Secondary | ICD-10-CM | POA: Diagnosis not present

## 2019-05-22 DIAGNOSIS — E1169 Type 2 diabetes mellitus with other specified complication: Secondary | ICD-10-CM | POA: Diagnosis not present

## 2019-05-22 DIAGNOSIS — E119 Type 2 diabetes mellitus without complications: Secondary | ICD-10-CM | POA: Diagnosis not present

## 2019-05-22 DIAGNOSIS — E785 Hyperlipidemia, unspecified: Secondary | ICD-10-CM | POA: Diagnosis not present

## 2019-05-22 DIAGNOSIS — I10 Essential (primary) hypertension: Secondary | ICD-10-CM | POA: Diagnosis not present

## 2019-05-24 ENCOUNTER — Encounter: Payer: Self-pay | Admitting: Cardiology

## 2019-05-24 DIAGNOSIS — Z45018 Encounter for adjustment and management of other part of cardiac pacemaker: Secondary | ICD-10-CM | POA: Insufficient documentation

## 2019-05-24 HISTORY — DX: Encounter for adjustment and management of other part of cardiac pacemaker: Z45.018

## 2019-05-28 ENCOUNTER — Other Ambulatory Visit: Payer: Self-pay

## 2019-05-28 ENCOUNTER — Other Ambulatory Visit: Payer: Self-pay | Admitting: Family Medicine

## 2019-05-28 ENCOUNTER — Telehealth: Payer: Self-pay | Admitting: Physical Therapy

## 2019-05-28 DIAGNOSIS — R202 Paresthesia of skin: Secondary | ICD-10-CM

## 2019-05-28 NOTE — Telephone Encounter (Signed)
Last seen 03/28/2019 F/u 06/27/2019 Last given 04/23/2019 #120 with no rf

## 2019-05-28 NOTE — Telephone Encounter (Signed)
Copied from [place] [phone]. Topic: General - Other >> May 28, 2019  4:11 PM Keene Breath wrote: Reason for CRM: Patient called because she received the letter that Dr. Juleen China will be leaving and she already has an appt. Scheduled for 06/27/19.  Patient would like to know what is going to happen and to whom will she be transferred to.  Please call patient to discuss.  CB# [phone]

## 2019-05-29 MED ORDER — HYDROCODONE-ACETAMINOPHEN 5-325 MG PO TABS: ORAL_TABLET | ORAL | 0 refills | Status: DC

## 2019-06-01 NOTE — Telephone Encounter (Signed)
Patient called in stating she would like to speak with [name]. Please advise.

## 2019-06-01 NOTE — Telephone Encounter (Signed)
Can you call patient to rs with another doc if her app is after Dr Juleen China leaves?

## 2019-06-01 NOTE — Telephone Encounter (Signed)
See below

## 2019-06-01 NOTE — Telephone Encounter (Signed)
Called pt and LVM to TOC

## 2019-06-08 ENCOUNTER — Other Ambulatory Visit: Payer: Self-pay

## 2019-06-08 ENCOUNTER — Ambulatory Visit: Payer: Medicare Other | Admitting: Orthotics

## 2019-06-08 DIAGNOSIS — M2142 Flat foot [pes planus] (acquired), left foot: Secondary | ICD-10-CM | POA: Diagnosis not present

## 2019-06-08 DIAGNOSIS — M2011 Hallux valgus (acquired), right foot: Secondary | ICD-10-CM | POA: Diagnosis not present

## 2019-06-08 DIAGNOSIS — M2012 Hallux valgus (acquired), left foot: Secondary | ICD-10-CM | POA: Diagnosis not present

## 2019-06-08 DIAGNOSIS — E1140 Type 2 diabetes mellitus with diabetic neuropathy, unspecified: Secondary | ICD-10-CM | POA: Diagnosis not present

## 2019-06-08 DIAGNOSIS — M2141 Flat foot [pes planus] (acquired), right foot: Secondary | ICD-10-CM | POA: Diagnosis not present

## 2019-06-12 DIAGNOSIS — H52203 Unspecified astigmatism, bilateral: Secondary | ICD-10-CM | POA: Diagnosis not present

## 2019-06-12 DIAGNOSIS — H2513 Age-related nuclear cataract, bilateral: Secondary | ICD-10-CM | POA: Diagnosis not present

## 2019-06-12 DIAGNOSIS — E119 Type 2 diabetes mellitus without complications: Secondary | ICD-10-CM | POA: Diagnosis not present

## 2019-06-12 LAB — HM DIABETES EYE EXAM

## 2019-06-21 ENCOUNTER — Telehealth: Payer: Self-pay

## 2019-06-21 NOTE — Telephone Encounter (Signed)
Copied from [place] [phone]. Topic: General - Call Back - No Documentation >> Jun 21, 2019 11:00 AM Erick Blinks wrote: Reason for CRM: Pt requesting a call back from PCP, interested in est with new provider and is seeking recommendations/advice Best contact: [phone]

## 2019-06-21 NOTE — Telephone Encounter (Signed)
Do you want her to to see wolfe or andy?

## 2019-06-22 NOTE — Telephone Encounter (Signed)
Either would be good for her. I think that she is on prn pain medication. May need to send to Dr. Jonni Sanger in that case.

## 2019-06-25 ENCOUNTER — Other Ambulatory Visit: Payer: Self-pay | Admitting: Family Medicine

## 2019-06-25 NOTE — Telephone Encounter (Signed)
Called patient she has an app with Dr. Jonni Sanger coming up. No other questions at this time.

## 2019-06-26 ENCOUNTER — Other Ambulatory Visit: Payer: Self-pay | Admitting: Family Medicine

## 2019-06-26 DIAGNOSIS — Z1231 Encounter for screening mammogram for malignant neoplasm of breast: Secondary | ICD-10-CM

## 2019-06-27 ENCOUNTER — Ambulatory Visit: Payer: Medicare Other | Admitting: Family Medicine

## 2019-07-02 ENCOUNTER — Other Ambulatory Visit: Payer: Self-pay | Admitting: Family Medicine

## 2019-07-03 NOTE — Telephone Encounter (Signed)
Reviewed chart; on chronic narcotics per prior PCP. Has TOC appt with me soon: refilled.

## 2019-07-03 NOTE — Telephone Encounter (Signed)
Ok to fill 

## 2019-07-12 ENCOUNTER — Ambulatory Visit: Payer: Medicare Other | Admitting: Cardiology

## 2019-07-12 ENCOUNTER — Other Ambulatory Visit: Payer: Self-pay | Admitting: Family Medicine

## 2019-07-12 ENCOUNTER — Encounter: Payer: Self-pay | Admitting: Cardiology

## 2019-07-12 ENCOUNTER — Other Ambulatory Visit: Payer: Self-pay

## 2019-07-12 VITALS — BP 133/71 | HR 77 | Ht 62.0 in | Wt 203.0 lb

## 2019-07-12 DIAGNOSIS — E1151 Type 2 diabetes mellitus with diabetic peripheral angiopathy without gangrene: Secondary | ICD-10-CM | POA: Diagnosis not present

## 2019-07-12 DIAGNOSIS — Z45018 Encounter for adjustment and management of other part of cardiac pacemaker: Secondary | ICD-10-CM | POA: Diagnosis not present

## 2019-07-12 DIAGNOSIS — Z950 Presence of cardiac pacemaker: Secondary | ICD-10-CM

## 2019-07-12 DIAGNOSIS — E1159 Type 2 diabetes mellitus with other circulatory complications: Secondary | ICD-10-CM

## 2019-07-12 DIAGNOSIS — I442 Atrioventricular block, complete: Secondary | ICD-10-CM

## 2019-07-12 DIAGNOSIS — I10 Essential (primary) hypertension: Secondary | ICD-10-CM | POA: Diagnosis not present

## 2019-07-12 NOTE — Progress Notes (Signed)
Primary Physician/Referring:  Leamon Arnt, MD  Patient ID: Krista Clarke, female    DOB: October 30, 1940, 78 y.o.   MRN: [id number]  Chief Complaint  Patient presents with  . Pacemaker Check  . PAD    1 year f/u    HPI:    [name]  is a 78 y.o. AAF female  with  second-degree AV block and complete heart and S/P St. Jude permanent pacemaker implantation on 12/19/2014, hypertension, hyperlipidemia, uncontrolled type II diabetes with stage 2 chronic kidney disease and obesity is here on annual visit and follow-up of hypertension, PAD and also pacemaker follow-up.  She is currently doing well and denies any chest pain, she has not had any angina pectoris, denies symptoms of claudication.  She is tying the best to be physically active, she uses a walker for walking in support.  No dizziness or syncope. Denies claudication, activity limited due to arthritis.  Past Medical History:  Diagnosis Date  . Anemia    takes iron daily  . Arthritis   . Diabetes mellitus without complication ([place])   . Encounter for care of pacemaker 05/24/2019  . Heart block AV complete ([employer]) 08/20/2015  . Hypercholesteremia   . Hypertension    Past Surgical History:  Procedure Laterality Date  . ABDOMINAL HYSTERECTOMY    . COLONOSCOPY WITH PROPOFOL N/A 07/03/2013   Procedure: COLONOSCOPY WITH PROPOFOL;  Surgeon: Garlan Fair, MD;  Location: [facility];  Service: Endoscopy;  Laterality: N/A;  . EP IMPLANTABLE DEVICE N/A 08/20/2015   Procedure: Pacemaker Implant;  Surgeon: Evans Lance, MD;  Location: [place] [facility];  Service: Cardiovascular;  Laterality: N/A;  . JOINT REPLACEMENT Bilateral    '99. Knees  . LEFT HEART CATHETERIZATION WITH CORONARY ANGIOGRAM N/A 08/27/2014   Procedure: LEFT HEART CATHETERIZATION WITH CORONARY ANGIOGRAM;  Surgeon: Laverda Page, MD;  Location: [facility] CATH LAB;  Service: Cardiovascular;  Laterality: N/A;  . TUBAL LIGATION     Social History   Socioeconomic History  .  Marital status: Widowed    Spouse name: Not on file  . Number of children: 5  . Years of education: Not on file  . Highest education level: Not on file  Occupational History  . Occupation: Retired   Scientific laboratory technician  . Financial resource strain: Not on file  . Food insecurity    Worry: Not on file    Inability: Not on file  . Transportation needs    Medical: Not on file    Non-medical: Not on file  Tobacco Use  . Smoking status: Never Smoker  . Smokeless tobacco: Never Used  Substance and Sexual Activity  . Alcohol use: No  . Drug use: No  . Sexual activity: Not Currently  Lifestyle  . Physical activity    Days per week: Not on file    Minutes per session: Not on file  . Stress: Not on file  Relationships  . Social Herbalist on phone: Not on file    Gets together: Not on file    Attends religious service: Not on file    Active member of club or organization: Not on file    Attends meetings of clubs or organizations: Not on file    Relationship status: Not on file  . Intimate partner violence    Fear of current or ex partner: Not on file    Emotionally abused: Not on file    Physically abused: Not on  file    Forced sexual activity: Not on file  Other Topics Concern  . Not on file  Social History Narrative  . Not on file   ROS  Review of Systems  Constitution: Negative for chills, decreased appetite, malaise/fatigue and weight gain.  Cardiovascular: Positive for leg swelling (occasional). Negative for dyspnea on exertion and syncope.  Endocrine: Negative for cold intolerance.  Hematologic/Lymphatic: Does not bruise/bleed easily.  Musculoskeletal: Positive for back pain and joint pain. Negative for joint swelling and muscle cramps. Arthritis: chronic.  Gastrointestinal: Negative for abdominal pain, anorexia, change in bowel habit, hematochezia and melena.  Neurological: Positive for disturbances in coordination (uses walker). Negative for headaches and  light-headedness.  Psychiatric/Behavioral: Negative for depression and substance abuse.  All other systems reviewed and are negative.  Objective   Vitals with BMI 07/12/2019 03/28/2019 12/26/2018  Height 5\' 2"  5\' 3"  5\' 3"   Weight 203 lbs 208 lbs 6 oz 197 lbs  BMI 37.12 Q000111Q 0000000  Systolic Q000111Q - 123456  Diastolic 71 - 74  Pulse 77 88 65      Physical Exam  Constitutional: She appears well-developed. No distress.  Morbidly obese  HENT:  Head: Atraumatic.  Eyes: Conjunctivae are normal.  Neck: Neck supple. No thyromegaly present.  Short neck and difficult to evaluate JVP  Cardiovascular: Normal rate, regular rhythm and normal heart sounds. Exam reveals no gallop.  No murmur heard. Pulses:      Carotid pulses are 2+ on the right side and 2+ on the left side.      Dorsalis pedis pulses are 1+ on the right side and 1+ on the left side.       Posterior tibial pulses are 0 on the right side and 0 on the left side.  Femoral and popliteal pulse difficult to feel due to patient's body habitus. Capillary refill normal. No skin breakdown. Skin is warm in the legs. No leg edema. No JVD.   Pulmonary/Chest: Effort normal and breath sounds normal.  Pacemaker pocket in the left infraclavicular region  Abdominal: Soft. Bowel sounds are normal.  Obese. Pannus present  Musculoskeletal: Normal range of motion.  Neurological: She is alert.  Skin: Skin is warm and dry.  Psychiatric: She has a normal mood and affect.   Laboratory examination:   Recent Labs    09/26/18 1448 03/28/19 1510  NA 138 140  K 5.0 4.9  CL 102 104  CO2 26 25  GLUCOSE 153* 104*  BUN 28* 36*  CREATININE 1.14 1.17  CALCIUM 10.0 10.2   CrCl cannot be calculated (Patient's most recent lab result is older than the maximum 21 days allowed.).  CMP Latest Ref Rng & Units 03/28/2019 09/26/2018 02/08/2018  Glucose 70 - 99 mg/dL 104(H) 153(H) 142(H)  BUN 6 - 23 mg/dL 36(H) 28(H) 29(H)  Creatinine 0.40 - 1.20 mg/dL 1.17 1.14  0.98  Sodium 135 - 145 mEq/L 140 138 137  Potassium 3.5 - 5.1 mEq/L 4.9 5.0 4.2  Chloride 96 - 112 mEq/L 104 102 99  CO2 19 - 32 mEq/L 25 26 29   Calcium 8.4 - 10.5 mg/dL 10.2 10.0 10.7(H)  Total Protein 6.0 - 8.3 g/dL 7.8 7.9 8.2  Total Bilirubin 0.2 - 1.2 mg/dL 0.4 0.3 0.4  Alkaline Phos 39 - 117 U/L 62 78 65  AST 0 - 37 U/L 12 12 14   ALT 0 - 35 U/L 10 11 13    CBC Latest Ref Rng & Units 03/28/2019 09/26/2018 05/12/2018  WBC 4.0 -  10.5 K/uL 6.8 7.2 6.5  Hemoglobin 12.0 - 15.0 g/dL 10.0(L) 10.2(L) 10.9(L)  Hematocrit 36.0 - 46.0 % 32.5(L) 32.0(L) 34.0(L)  Platelets 150.0 - 400.0 K/uL 293.0 279.0 290.0   Lipid Panel     Component Value Date/Time   CHOL 133 03/28/2019 1510   TRIG 120.0 03/28/2019 1510   HDL 50.80 03/28/2019 1510   CHOLHDL 3 03/28/2019 1510   VLDL 24.0 03/28/2019 1510   LDLCALC 58 03/28/2019 1510   HEMOGLOBIN A1C Lab Results  Component Value Date   HGBA1C 6.7 (H) 03/28/2019   TSH Recent Labs    09/26/18 1448 03/28/19 1510  TSH 3.69 2.03   Medications and allergies   Allergies  Allergen Reactions  . Prednisone Palpitations and Other (See Comments)    Doesn't work well for patient     Current Outpatient Medications  Medication Instructions  . allopurinol (ZYLOPRIM) 100 MG tablet TAKE 1 TABLET BY MOUTH EVERY MORNING  . amLODipine (NORVASC) 10 MG tablet TAKE 1 TABLET BY MOUTH EVERY DAY  . ammonium lactate (LAC-HYDRIN) 12 % cream Topical, As needed  . aspirin 81 mg, Oral, Daily  . atorvastatin (LIPITOR) 40 mg, Oral, Daily  . clopidogrel (PLAVIX) 75 mg, Oral, Daily  . FEROSUL 325 (65 Fe) MG tablet TAKE 1 TABLET BY MOUTH 2 TIMES DAILY  . furosemide (LASIX) 20 MG tablet TAKE 1 TABLET BY MOUTH EVERY DAY  . gabapentin (NEURONTIN) 300 MG capsule TAKE 1 CAPSULE BY MOUTH 3 TIMES DAILY  . HYDROcodone-acetaminophen (NORCO/VICODIN) 5-325 MG tablet TAKE 1 TABLET BY MOUTH 4 TIMES DAILY AS NEEDED FOR CHRONIC PAIN  . losartan (COZAAR) 50 mg, Oral, Daily  . Lutein 20  mg, Oral, Daily  . magnesium oxide (MAG-OX) 400 mg, Oral, Daily  . metFORMIN (GLUCOPHAGE) 1,000 mg, Oral, 2 times daily with meals  . metoprolol tartrate (LOPRESSOR) 25 mg, Oral, 2 times daily  . omeprazole (PRILOSEC) 20 mg, Oral, Daily before breakfast  . potassium chloride (KLOR-CON) 10 MEQ tablet TAKE 2 TABLETS BY MOUTH 2 TIMES DAILY  . vitamin C (ASCORBIC ACID) 500 MG tablet TAKE 1 TABLET BY MOUTH EVERY DAY AT [facility]    Radiology:  No results found. Cardiac Studies:   Echocardiogram [12/11/2014]:  1. Left ventricle cavity is normal in size. Mild concentric hypertrophy of the left ventricle. Normal global wall motion. Doppler evidence of grade II (pseudonormal) diastolic dysfunction. Calculated EF 63%. 2. Trace mitral regurgitation. Mild calcification of the mitral valve annulus. 3. Trace tricuspid regurgitation. IVC is normal with blunted respiratory response.  Lower Extremity Arterial Duplex [04/29/2016]:  There is mild arterial occlusive disease. The above study suggests mild right iliac arterial disease and mild left distal popliteal or tibial disease. Right ABI 1.06, left ABI of 1.07    Coronary angiogram 08/27/2014:  Normal coronary arteries. Severe tortuosity, suggests hypertensive heart disease. Normal LVEF.  Nuclear stress test [07/16/2013]:  1. Resting EKG NSR, LBBB. Stress EKG was non diagnostic for ischemia. No ADDITIONAL ST-T changes of ischemia noted with pharmacologic stress testing. Stress symptoms included lightheadedness, headache and chest pressure. Stress terminated due to completion of protocol. 2. The perfusion study demonstrated a very prominent breast attenuation artifact in the inferior, inferoseptal region, large size. Left ventricular ejection fraction was estimated to be 78%. This appearance is low risk study. Please see above for explanation.  Assessment     ICD-10-CM   1. Encounter for care of pacemaker  Z45.018   2. Pacemaker; [place]  chamber PPM - 08/20/2015. Dx: 2:1 AVB, CHB  Z95.0   3. Complete heart block (HCC)  I44.2   4. Essential hypertension  I10   5. Diabetes mellitus type 2 with peripheral artery disease (HCC)  E11.51     Remote pacemaker check 9.15.20:   2 atrial high rate episodes detected (Less than 6 Sec). EGMs show AT/VP. There were 0 high ventricular rate episodes detected. Health trends do not demonstrate significant abnormality. Battery longevity is 10.4 - 11.2 years. RA pacing is 1.1 %, RV pacing is >99 %.  Scheduled  In office pacemaker check 07/12/19  Single (S)/Dual (D)/BV: D. Presenting ASVP @ 70/min. Pacemaker dependant:  Yes. Underlying CHB. AP 1%, VP 100%. BP NA%. AMS Episodes None.  AT/AF burden None% . Longest NA. Latest NA. HVR None. Longevity 11.2 Years. Magnet rate: >85%. Lead measurements: Stable. Thoracic impedance: NA. Histogram: Low (L)/normal (N)/high (H)  Normal. Patient activity Good.   Recommendations:  No orders of the defined types were placed in this encounter.   Patient is here on annual visit and follow-up of pacemaker evaluation, hypertension and also peripheral artery disease.  Pacemaker interrogation reveals normal function.  No changes were done today.  She has not had any atrial fibrillation.  Blood pressure is very well controlled.  She is on appropriate medical therapy.  With regard to peripheral arterial disease, her activity is limited and she denies any symptoms of claudication, past examination does reveal decreased also seen the ankle however capillary refill is normal, there is no wound, there is no limb threatening ischemia.  Continue present medical therapy.  I'll see her back on an annual basis.  Patient is appropriately being treated with dual antiplatelet therapy, statins and diabetes is well controlled, lipids are also under excellent control.  No changes in the medications were done today. Her labs were reviewed.  Adrian Prows, MD, [facility]  07/12/2019, 4:17 PM [facility]. [place] Pager: [phone] Office: [phone] If no answer Cell [phone]

## 2019-07-13 ENCOUNTER — Ambulatory Visit (INDEPENDENT_AMBULATORY_CARE_PROVIDER_SITE_OTHER): Payer: Medicare Other | Admitting: Family Medicine

## 2019-07-13 ENCOUNTER — Encounter: Payer: Self-pay | Admitting: Family Medicine

## 2019-07-13 VITALS — BP 122/70 | HR 80 | Temp 98.0°F | Ht 62.0 in | Wt 201.4 lb

## 2019-07-13 DIAGNOSIS — E669 Obesity, unspecified: Secondary | ICD-10-CM

## 2019-07-13 DIAGNOSIS — Z23 Encounter for immunization: Secondary | ICD-10-CM

## 2019-07-13 DIAGNOSIS — I10 Essential (primary) hypertension: Secondary | ICD-10-CM

## 2019-07-13 DIAGNOSIS — M5441 Lumbago with sciatica, right side: Secondary | ICD-10-CM | POA: Diagnosis not present

## 2019-07-13 DIAGNOSIS — G8929 Other chronic pain: Secondary | ICD-10-CM

## 2019-07-13 DIAGNOSIS — E1169 Type 2 diabetes mellitus with other specified complication: Secondary | ICD-10-CM

## 2019-07-13 DIAGNOSIS — F1120 Opioid dependence, uncomplicated: Secondary | ICD-10-CM | POA: Insufficient documentation

## 2019-07-13 DIAGNOSIS — E785 Hyperlipidemia, unspecified: Secondary | ICD-10-CM

## 2019-07-13 DIAGNOSIS — E1159 Type 2 diabetes mellitus with other circulatory complications: Secondary | ICD-10-CM

## 2019-07-13 DIAGNOSIS — M5442 Lumbago with sciatica, left side: Secondary | ICD-10-CM

## 2019-07-13 LAB — POCT GLYCOSYLATED HEMOGLOBIN (HGB A1C): Hemoglobin A1C: 6.3 % — AB (ref 4.0–5.6)

## 2019-07-13 MED ORDER — SHINGRIX 50 MCG/0.5ML IM SUSR: 0.5000 mL | Freq: Once | INTRAMUSCULAR | 0 refills | Status: AC

## 2019-07-13 NOTE — Patient Instructions (Addendum)
Please return in 3 months for recheck of diabetes and pain management.   Things look good. Please call me if you need me.   Please take the prescription for Shingrix to the pharmacy so they may administer the vaccinations. Your insurance will then cover the injections.  It was a pleasure meeting you today! Thank you for choosing [place] to meet your healthcare needs! I truly look forward to working with you. If you have any questions or concerns, please send me a message via Mychart or call the office at [phone].

## 2019-07-13 NOTE — Progress Notes (Signed)
Subjective  CC:  Chief Complaint  Patient presents with   Transitions Of Care    HPI: Krista Clarke is a 78 y.o. female who presents to [facility] today to establish care with me as a new patient.   She has the following concerns or needs:  Very pleasant 78 yo female lives independently with gait abnormality, lumbar djd and chronic pain on chronic narcotics x 3 years per prior PCP, DM that has been well controlled and h/o heart block with pacer in place.   Overall doing well. No new concerns.   HM: due pneumovax.   Assessment  1. Diabetes mellitus type 2 in obese ([place]), on Metformin   2. Need for prophylactic vaccination against Streptococcus pneumoniae (pneumococcus)   3. Hyperlipidemia associated with type 2 diabetes mellitus ([place]), on Lipitor   4. Hypertension associated with diabetes ([place]), on Norvasc, Hyzaar, Lasix/K, Metoprolol, followed by Cardiology   5. Chronic midline low back pain with bilateral sciatica, stable on Norco for breakthrough pain   6. Chronic narcotic dependence ([place])      Plan   DM is very well controlled. Pneumovax updated tday  shingrix RX given with education  Chronic pain: rec decreasing narcotic use. Decrease to TID. Will go slowly. Pt is open to change. Discussed risks vs benefits.   BP and heart disease is controlled per Dr. Einar Gip  Follow up:  3 months for recheck.  AWV  Orders Placed This Encounter  Procedures   Pneumococcal polysaccharide vaccine 23-valent greater than or equal to 2yo subcutaneous/IM   POCT glycosylated hemoglobin (Hb A1C)   Meds ordered this encounter  Medications   Zoster Vaccine Adjuvanted [facility]) injection    Sig: Inject 0.5 mLs into the muscle once for 1 dose. Please give 2nd dose 2-6 months after first dose    Dispense:  2 each    Refill:  0     Depression screen [facility] 2/9 07/13/2019 03/28/2019 03/28/2019 06/28/2018 05/12/2018  Decreased Interest 0 0 1 0 1  Down, Depressed,  Hopeless 0 0 1 0 1  PHQ - 2 Score 0 0 2 0 2  Altered sleeping - 1 0 - 1  Tired, decreased energy - 0 0 - 1  Change in appetite - 1 1 - 1  Feeling bad or failure about yourself  - 0 0 - 1  Trouble concentrating - 0 1 - 1  Moving slowly or fidgety/restless - 0 0 - 1  Suicidal thoughts - 0 0 - 0  PHQ-9 Score - 2 4 - 8  Difficult doing work/chores - Not difficult at all Not difficult at all - Somewhat difficult    We updated and reviewed the patient's past history in detail and it is documented below.  Patient Active Problem List   Diagnosis Date Noted   Chronic narcotic dependence ([place]) 07/13/2019   Encounter for care of pacemaker 05/24/2019   Urinary incontinence in female, followed by GYN, did not tolerate pessary 06/27/2018   LBBB (left bundle branch block) 11/18/2016    EKG 07/08/16: Sinus rhythm , rate 80, left axis deviation, left anterior fascicular block, left bundle branch block.    PAD (peripheral artery disease) ([place]) 11/18/2016    LE [place] on 04/29/16, mild arterial occlusive disease. Mild right iliac arterial disease and mild left distal popliteal disease. Right ABI 1.06, Left ABI 1.07.    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 Okay to refill today with the intent for her to use it sparingly. No new concerns or red flags.    Paresthesia of both lower extremities 10/02/2016    Previously on Neurontin.    Morbid obesity ([place]) 10/02/2016    Improving since being home. The patient is asked to make an attempt to improve diet and exercise patterns to aid in medical management of this problem.     Hyperlipidemia associated with type 2 diabetes mellitus ([place]), on Lipitor 10/02/2016   Hypertension associated with diabetes ([place]), on Norvasc, Hyzaar, Lasix/K, Metoprolol, followed by Cardiology 03/08/2016     Medications reviewed and adjusted today.     Microcytic anemia 03/08/2016    Hgb = 9.5 on 08/03/16. Per previous PCP, noted to be worsening and referral to GI recommended.   CBC Latest Ref Rng & Units 05/12/2018 02/08/2018 10/04/2017  WBC 4.0 - 10.5 K/uL 6.5 6.1 5.8  Hemoglobin 12.0 - 15.0 g/dL 10.9(L) 10.7(L) 10.0(L)  Hematocrit 36.0 - 46.0 % 34.0(L) 33.4(L) 31.8(L)  Platelets 150.0 - 400.0 K/uL 290.0 290.0 260.0   Lab Results  Component Value Date   IRON 50 05/12/2018   TIBC 260 05/12/2018   FERRITIN 243 05/12/2018       Diabetes mellitus type 2 in obese ([place]), on Metformin 03/08/2016    Current symptoms: no polyuria or polydipsia, no chest pain, dyspnea or TIA's. Taking medication compliantly without noted sided effects [x]   YES  []   NO  Episodes of hypoglycemia? []   YES  [x]   NO Maintaining a diabetic diet? [x]   YES  []   NO Trying to exercise on a regular basis? []   YES  [x]   NO  On ACE inhibitor or angiotensin II receptor blocker? [x]   YES  []   NO On Aspirin? [x]   YES  []   NO    Complete heart block ([place]) 08/20/2015    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, CHB 08/20/2015    Remote pacemaker check 9.15.20:   2 atrial high rate episodes detected (Less than 6 Sec). EGMs show AT/VP. There were 0 high ventricular rate episodes detected. Health trends do not demonstrate significant abnormality. Battery longevity is 10.4 - 11.2 years. RA pacing is 1.1 %, RV pacing is >99 %.  Scheduled  In office pacemaker check 07/12/19  Single (S)/Dual (D)/BV: D. Presenting ASVP @ 70/min. Pacemaker dependant:  Yes. Underlying CHB. AP 1%, VP 100%. BP NA%. AMS Episodes None.  AT/AF burden None% . Longest NA. Latest NA. HVR None. Longevity 11.2 Years. Magnet rate: >85%. Lead measurements: Stable. Thoracic impedance: NA. Histogram: Low (L)/normal (N)/high (H)  Normal. Patient activity  Good.     Health Maintenance  Topic Date Due   HEMOGLOBIN A1C  09/28/2019   DEXA SCAN  01/31/2020   FOOT EXAM  03/27/2020   OPHTHALMOLOGY EXAM  06/11/2020   TETANUS/TDAP  06/15/2028   INFLUENZA VACCINE  Completed   PNA vac Low Risk Adult  Completed   Immunization History  Administered Date(s) Administered   Fluad Quad(high Dose 65+) 06/19/2019   Influenza, High Dose Seasonal PF 06/30/2017, 05/12/2018   Pneumococcal Conjugate-13 05/12/2018   Pneumococcal Polysaccharide-23 07/13/2019   Tdap 06/15/2018   Current Meds  Medication Sig   allopurinol (ZYLOPRIM) 100 MG tablet  TAKE 1 TABLET BY MOUTH EVERY MORNING   amLODipine (NORVASC) 10 MG tablet TAKE 1 TABLET BY MOUTH EVERY DAY   ASPIRIN LOW DOSE 81 MG chewable tablet CHEW ONE TABLET BY MOUTH EVERY DAY   atorvastatin (LIPITOR) 40 MG tablet TAKE 1 TABLET BY MOUTH EVERY DAY   clopidogrel (PLAVIX) 75 MG tablet Take 75 mg by mouth daily.   FEROSUL 325 (65 Fe) MG tablet TAKE 1 TABLET BY MOUTH 2 TIMES DAILY   furosemide (LASIX) 20 MG tablet TAKE 1 TABLET BY MOUTH EVERY DAY   gabapentin (NEURONTIN) 300 MG capsule TAKE 1 CAPSULE BY MOUTH 3 TIMES DAILY   HYDROcodone-acetaminophen (NORCO/VICODIN) 5-325 MG tablet TAKE 1 TABLET BY MOUTH 4 TIMES DAILY AS NEEDED FOR CHRONIC PAIN   losartan (COZAAR) 50 MG tablet TAKE 1 TABLET BY MOUTH EVERY DAY   Lutein 20 MG CAPS TAKE 1 CAPSULE BY MOUTH EVERY DAY   magnesium oxide (MAG-OX) 400 MG tablet TAKE 1 TABLET BY MOUTH EVERY DAY   metFORMIN (GLUCOPHAGE) 1000 MG tablet Take 1 tablet (1,000 mg total) by mouth 2 (two) times daily with a meal.   metoprolol tartrate (LOPRESSOR) 25 MG tablet TAKE 1 TABLET BY MOUTH 2 TIMES DAILY IN THE MORNING AND AT BEDTIME   omeprazole (PRILOSEC) 20 MG capsule Take 1 capsule (20 mg total) by mouth daily before breakfast.   potassium chloride (KLOR-CON) 10 MEQ tablet TAKE 2 TABLETS BY MOUTH 2 TIMES DAILY   vitamin C (ASCORBIC ACID) 500 MG tablet TAKE  1 TABLET BY MOUTH EVERY DAY AT NOON    Allergies: Patient is allergic to prednisone. Past Medical History Patient  has a past medical history of Anemia, Arthritis, Diabetes mellitus without complication ([place]), Encounter for care of pacemaker (05/24/2019), Heart block AV complete ([place]) (08/20/2015), Hypercholesteremia, and Hypertension. Past Surgical History Patient  has a past surgical history that includes Tubal ligation; Abdominal hysterectomy; Colonoscopy with propofol (N/A, 07/03/2013); left heart catheterization with coronary angiogram (N/A, 08/27/2014); Cardiac catheterization (N/A, 08/20/2015); and Joint replacement (Bilateral). Family History: Patient family history includes Cancer in her brother; Diabetes in her mother; Hypertension in her father and mother. Social History:  Patient  reports that she has never smoked. She has never used smokeless tobacco. She reports that she does not drink alcohol or use drugs.  Review of Systems: Constitutional: negative for fever or malaise Ophthalmic: negative for photophobia, double vision or loss of vision Cardiovascular: negative for chest pain, dyspnea on exertion, or new LE swelling Respiratory: negative for SOB or persistent cough Gastrointestinal: negative for abdominal pain, change in bowel habits or melena Genitourinary: negative for dysuria or gross hematuria Musculoskeletal: negative for new gait disturbance or muscular weakness Integumentary: negative for new or persistent rashes Neurological: negative for TIA or stroke symptoms Psychiatric: negative for SI or delusions Allergic/Immunologic: negative for hives  Patient Care Team    Relationship Specialty Notifications Start End  Leamon Arnt, MD PCP - General Family Medicine  07/12/19   Adrian Prows, MD Consulting Physician Cardiology  11/18/16   Specialists, [place] Physician Orthopedic Surgery  04/28/17   Melissa Noon, [place] Referring Physician  Optometry  04/28/17   Bjorn Loser, MD Consulting Physician Urology  07/13/19     Objective  Vitals: BP 122/70 (BP Location: Left Arm, Patient Position: Sitting, Cuff Size: Large)    Pulse 80    Temp 98 F (36.7 C) (Skin)    Ht 5\' 2"  (1.575 m)    Wt 201 lb 6.4  oz (91.4 kg)    LMP  (LMP Unknown)    SpO2 97%    BMI 36.84 kg/m  General:  Well developed, well nourished, no acute distress  Psych:  Alert and oriented,normal mood and affect HEENT:  Normocephalic, atraumatic, non-icteric sclera, PERRL, oropharynx is without mass or exudate, supple neck without adenopathy, mass or thyromegaly Cardiovascular:  RRR without gallop, rub or murmur, nondisplaced PMI Respiratory:  Good breath sounds bilaterally, CTAB with normal respiratory effort Skin:  Warm, no rashes or suspicious lesions noted Neurologic:    Mental status is normal.    Commons side effects, risks, benefits, and alternatives for medications and treatment plan prescribed today were discussed, and the patient expressed understanding of the given instructions. Patient is instructed to call or message via MyChart if he/she has any questions or concerns regarding our treatment plan. No barriers to understanding were identified. We discussed Red Flag symptoms and signs in detail. Patient expressed understanding regarding what to do in case of urgent or emergency type symptoms.   Medication list was reconciled, printed and provided to the patient in AVS. Patient instructions and summary information was reviewed with the patient as documented in the AVS. This note was prepared with assistance of Dragon voice recognition software. Occasional wrong-word or sound-a-like substitutions may have occurred due to the inherent limitations of voice recognition software

## 2019-07-17 ENCOUNTER — Telehealth: Payer: Self-pay | Admitting: Physical Therapy

## 2019-07-17 NOTE — Telephone Encounter (Signed)
Patient inquiring on if insurance will cover visit if done virtually.  Notified patient that service will be covered by insurance

## 2019-07-17 NOTE — Telephone Encounter (Signed)
Copied from [place] [id number]. Topic: General - Inquiry >> Jul 17, 2019 10:35 AM Mathis Bud wrote: Reason for CRM: Patient is requesting a call back from AW coach.  Patient states she has a couple questions. Call back [phone]

## 2019-07-19 ENCOUNTER — Other Ambulatory Visit: Payer: Self-pay | Admitting: Family Medicine

## 2019-07-19 NOTE — Telephone Encounter (Signed)
See below

## 2019-07-20 ENCOUNTER — Other Ambulatory Visit: Payer: Self-pay | Admitting: Family Medicine

## 2019-07-20 MED ORDER — POTASSIUM CHLORIDE ER 20 MEQ PO TBCR: 20.0000 meq | EXTENDED_RELEASE_TABLET | Freq: Every day | ORAL | 3 refills | Status: DC

## 2019-07-20 NOTE — Telephone Encounter (Signed)
Okay to fill all of these? Patient was just seen on 11/13; wasn't sure about the Plavix rx

## 2019-07-22 ENCOUNTER — Encounter: Payer: Self-pay | Admitting: Cardiology

## 2019-07-24 ENCOUNTER — Other Ambulatory Visit: Payer: Self-pay

## 2019-07-24 ENCOUNTER — Ambulatory Visit: Payer: Medicare Other

## 2019-07-24 MED ORDER — ATORVASTATIN CALCIUM 40 MG PO TABS: 40.0000 mg | ORAL_TABLET | Freq: Every day | ORAL | 3 refills | Status: DC

## 2019-07-24 MED ORDER — MAGNESIUM OXIDE 400 MG PO TABS: 1.0000 | ORAL_TABLET | Freq: Every day | ORAL | 3 refills | Status: DC

## 2019-08-06 ENCOUNTER — Telehealth: Payer: Self-pay | Admitting: Family Medicine

## 2019-08-06 ENCOUNTER — Other Ambulatory Visit: Payer: Self-pay | Admitting: Family Medicine

## 2019-08-06 NOTE — Telephone Encounter (Signed)
Pharmacy called for clarification on patients medication    HYDROcodone-acetaminophen (NORCO/VICODIN) 5-325 MG tablet [id number]   Please advise

## 2019-08-07 NOTE — Telephone Encounter (Signed)
Called pharmacy and gave corrected directions.

## 2019-08-07 NOTE — Telephone Encounter (Signed)
See below

## 2019-08-08 ENCOUNTER — Other Ambulatory Visit: Payer: Self-pay

## 2019-08-08 ENCOUNTER — Encounter: Payer: Self-pay | Admitting: Podiatry

## 2019-08-08 ENCOUNTER — Ambulatory Visit: Payer: Medicare Other | Admitting: Podiatry

## 2019-08-08 DIAGNOSIS — B351 Tinea unguium: Secondary | ICD-10-CM | POA: Diagnosis not present

## 2019-08-08 DIAGNOSIS — D689 Coagulation defect, unspecified: Secondary | ICD-10-CM

## 2019-08-08 DIAGNOSIS — M79674 Pain in right toe(s): Secondary | ICD-10-CM

## 2019-08-08 DIAGNOSIS — M79675 Pain in left toe(s): Secondary | ICD-10-CM | POA: Diagnosis not present

## 2019-08-08 DIAGNOSIS — E1142 Type 2 diabetes mellitus with diabetic polyneuropathy: Secondary | ICD-10-CM

## 2019-08-08 NOTE — Progress Notes (Addendum)
Complaint:  Visit Type: Patient returns to my office for continued preventative foot care services. Complaint: Patient states" my nails have grown long and thick and become painful to walk and wear shoes" Patient has been diagnosed with DM with neuropathy.. The patient presents for preventative foot care services. No changes to ROS.  Patient is taking plavix.  Patient presents to office with her granddaughter.  Podiatric Exam: Vascular: dorsalis pedis and posterior tibial pulses are palpable bilateral. Capillary return is immediate. Temperature gradient is WNL. Skin turgor WNL  Sensorium: Absent  Semmes Weinstein monofilament test. Normal tactile sensation bilaterally. Nail Exam: Pt has thick disfigured discolored nails with subungual debris noted bilateral entire nail hallux through fifth toenails.  Right hallux nail has subungual hematoma. Ulcer Exam: There is no evidence of ulcer or pre-ulcerative changes or infection. Orthopedic Exam: Muscle tone and strength are WNL. No limitations in general ROM. No crepitus or effusions noted. Foot type and digits show no abnormalities. Bony prominences are unremarkable. Skin: No Porokeratosis. No infection or ulcers.  Peeling skin lesion left heel.  Asymptomatic.  Diagnosis:  Onychomycosis, , Pain in right toe, pain in left toes  Treatment & Plan Procedures and Treatment: Consent by patient was obtained for treatment procedures.   Debridement of mycotic and hypertrophic toenails, 1 through 5 bilateral and clearing of subungual debris. No ulceration, no infection noted. Told to apply vaseline to skin condition. Return Visit-Office Procedure: Patient instructed to return to the office for a follow up visit 3 months for continued evaluation and treatment.    Gardiner Barefoot DPM

## 2019-08-14 ENCOUNTER — Ambulatory Visit
Admission: RE | Admit: 2019-08-14 | Discharge: 2019-08-14 | Disposition: A | Discharge status: Home / Self Care | Payer: Medicare Other | Source: Ambulatory Visit | Attending: Family Medicine | Admitting: Family Medicine

## 2019-08-14 ENCOUNTER — Other Ambulatory Visit: Payer: Self-pay

## 2019-08-14 DIAGNOSIS — Z45018 Encounter for adjustment and management of other part of cardiac pacemaker: Secondary | ICD-10-CM | POA: Diagnosis not present

## 2019-08-14 DIAGNOSIS — Z1231 Encounter for screening mammogram for malignant neoplasm of breast: Secondary | ICD-10-CM | POA: Diagnosis not present

## 2019-08-14 DIAGNOSIS — Z950 Presence of cardiac pacemaker: Secondary | ICD-10-CM | POA: Diagnosis not present

## 2019-08-14 DIAGNOSIS — I442 Atrioventricular block, complete: Secondary | ICD-10-CM | POA: Diagnosis not present

## 2019-08-15 DIAGNOSIS — E1159 Type 2 diabetes mellitus with other circulatory complications: Secondary | ICD-10-CM | POA: Diagnosis not present

## 2019-08-15 DIAGNOSIS — I10 Essential (primary) hypertension: Secondary | ICD-10-CM | POA: Diagnosis not present

## 2019-08-15 DIAGNOSIS — E785 Hyperlipidemia, unspecified: Secondary | ICD-10-CM | POA: Diagnosis not present

## 2019-08-15 DIAGNOSIS — E1169 Type 2 diabetes mellitus with other specified complication: Secondary | ICD-10-CM | POA: Diagnosis not present

## 2019-09-12 ENCOUNTER — Other Ambulatory Visit: Payer: Self-pay | Admitting: Family Medicine

## 2019-09-12 NOTE — Telephone Encounter (Signed)
  LAST APPOINTMENT DATE: 07/13/2019  NEXT APPOINTMENT DATE:@2 /22/2021  MEDICATION: Hydrocodone-acetaminophen 5-325 mg  PHARMACY: [facility]- [address]  LAST REFILL: 08/06/2019  QTY: 90  REFILL S: 0    OTHER COMMENTS:    Okay for refill?  Please advise

## 2019-09-13 NOTE — Telephone Encounter (Signed)
Duplicate request.  Last filled yesterday on 09/12/2019

## 2019-09-18 ENCOUNTER — Other Ambulatory Visit: Payer: Self-pay | Admitting: Family Medicine

## 2019-09-19 NOTE — Telephone Encounter (Signed)
Please review

## 2019-10-02 ENCOUNTER — Telehealth: Payer: Self-pay | Admitting: Family Medicine

## 2019-10-02 NOTE — Telephone Encounter (Signed)
Patient notified that it its ok for her to get the Covid vaccination.

## 2019-10-02 NOTE — Telephone Encounter (Signed)
Pt called wanting to ask Dr. Jonni Sanger if it was okay for her to get the COVID Vaccine. Please advise.

## 2019-10-16 ENCOUNTER — Other Ambulatory Visit: Payer: Self-pay | Admitting: Family Medicine

## 2019-10-17 ENCOUNTER — Other Ambulatory Visit: Payer: Self-pay | Admitting: Family Medicine

## 2019-10-22 ENCOUNTER — Ambulatory Visit: Payer: Medicare Other | Admitting: Family Medicine

## 2019-10-22 ENCOUNTER — Telehealth: Payer: Self-pay

## 2019-10-22 NOTE — Telephone Encounter (Signed)
Too early for medication refill

## 2019-10-22 NOTE — Telephone Encounter (Signed)
MEDICATION:potassium chloride 20 MEQ TBCR vitamin C (ASCORBIC ACID) 500 MG tablet  PHARMACY: [facility] - [place], [place] - [address] Phone:  [phone]  Fax:  [phone]       Comments:   **Let patient know to contact pharmacy at the end of the day to make sure medication is ready. **  ** Please notify patient to allow 48-72 hours to process**  **Encourage patient to contact the pharmacy for refills or they can request refills through [facility]**

## 2019-10-23 ENCOUNTER — Other Ambulatory Visit: Payer: Self-pay

## 2019-10-23 MED ORDER — ASCORBIC ACID 500 MG PO TABS: 500.0000 mg | ORAL_TABLET | Freq: Every day | ORAL | 1 refills | Status: DC

## 2019-10-26 ENCOUNTER — Other Ambulatory Visit: Payer: Self-pay

## 2019-10-26 NOTE — Patient Outreach (Signed)
[facility]) Care Management  10/26/2019  Krista Clarke 1941-03-05 [id number]   Medication Adherence call to Krista Clarke [facility] Ident[facility] Verify spoke with patient she is past due on Atorvastatin 40 mg,Metformin 1000 mg and Losartan 50 mg. Patient explains she is receiving a pill pack every month,patient said she is not sure why she has not received it for this month,I let patient know we will call [place] ,talk with [place] they explain they wiil send it out today between 3-6 pm,to let the patient know.call patient back to let her know when pharmacy will deliver her meds. Krista Clarke is showing past due under [place].  [facility] Direct Dial [phone]  Fax [phone] [email or web address]

## 2019-10-31 ENCOUNTER — Ambulatory Visit (INDEPENDENT_AMBULATORY_CARE_PROVIDER_SITE_OTHER): Payer: Medicare Other | Admitting: Family Medicine

## 2019-10-31 ENCOUNTER — Other Ambulatory Visit: Payer: Self-pay

## 2019-10-31 ENCOUNTER — Encounter: Payer: Self-pay | Admitting: Family Medicine

## 2019-10-31 VITALS — BP 138/66 | HR 89 | Temp 97.3°F | Ht 62.0 in | Wt 201.4 lb

## 2019-10-31 DIAGNOSIS — I739 Peripheral vascular disease, unspecified: Secondary | ICD-10-CM | POA: Diagnosis not present

## 2019-10-31 DIAGNOSIS — F1120 Opioid dependence, uncomplicated: Secondary | ICD-10-CM

## 2019-10-31 DIAGNOSIS — E1169 Type 2 diabetes mellitus with other specified complication: Secondary | ICD-10-CM | POA: Diagnosis not present

## 2019-10-31 DIAGNOSIS — D509 Iron deficiency anemia, unspecified: Secondary | ICD-10-CM

## 2019-10-31 DIAGNOSIS — Z950 Presence of cardiac pacemaker: Secondary | ICD-10-CM

## 2019-10-31 DIAGNOSIS — M48061 Spinal stenosis, lumbar region without neurogenic claudication: Secondary | ICD-10-CM | POA: Insufficient documentation

## 2019-10-31 DIAGNOSIS — E669 Obesity, unspecified: Secondary | ICD-10-CM

## 2019-10-31 DIAGNOSIS — E1159 Type 2 diabetes mellitus with other circulatory complications: Secondary | ICD-10-CM

## 2019-10-31 DIAGNOSIS — I10 Essential (primary) hypertension: Secondary | ICD-10-CM

## 2019-10-31 HISTORY — DX: Spinal stenosis, lumbar region without neurogenic claudication: M48.061

## 2019-10-31 LAB — POCT GLYCOSYLATED HEMOGLOBIN (HGB A1C): Hemoglobin A1C: 6.2 % — AB (ref 4.0–5.6)

## 2019-10-31 MED ORDER — HYDROCODONE-ACETAMINOPHEN 5-325 MG PO TABS: ORAL_TABLET | ORAL | 0 refills | Status: DC

## 2019-10-31 MED ORDER — DICLOFENAC SODIUM 1 % EX GEL: 4.0000 g | Freq: Four times a day (QID) | CUTANEOUS | 3 refills | Status: DC | PRN

## 2019-10-31 NOTE — Progress Notes (Signed)
Subjective  CC:  Chief Complaint  Patient presents with  . Diabetes    checks DM 3 times daily. Diet is healthy. Currently on Metformin  . Hypertension    Readings are <140/80 at home. Few HA. Denies dizzines or visual changes    HPI: Krista Clarke is a 79 y.o. female who presents to the office today for follow up of diabetes and problems listed above in the chief complaint.   Diabetes follow up: Her diabetic control is reported as Unchanged. Taking medications. No lows.  She denies exertional CP or SOB or symptomatic hypoglycemia. She complains of worsening foot paresthesias w/o sores. Thinks she has failed gabapetin in the past andmaybe lyrica as well.   Chronic back pain on norco tid. Having more leg pain. More knee pain. No swelling. No leg weakness.  Card evaluation reviewed: stable HTN and functioning pacer. Mild PAD: sedentary. On aspirin.   Wt Readings from Last 3 Encounters:  10/31/19 201 lb 6.4 oz (91.4 kg)  07/13/19 201 lb 6.4 oz (91.4 kg)  07/12/19 203 lb (92.1 kg)    BP Readings from Last 3 Encounters:  10/31/19 138/66  07/13/19 122/70  07/12/19 133/71    Assessment  1. Diabetes mellitus type 2 in obese ([place]), on Metformin   2. 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 PAD (peripheral artery disease) ([place])   4. Chronic narcotic dependence ([place])   5. Hypertension associated with diabetes ([place]), on Norvasc, Hyzaar, Lasix/K, Metoprolol, followed by Cardiology   6. 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 Microcytic anemia      Plan   Diabetes is currently very well controlled. Continue current medications.  Pain, chronic: due to severe spinal stenosis. Continue pain meds.   Mild pad, likely asymptomatic  Paresthesias due to above. Failed lyrica and cymbalta  Knee pain: trial of voltaren gel.   HTN and cards: stable.   Anemia: monitor.   Follow up: Return in about 3 months (around  01/31/2020) for recheck.. Orders Placed This Encounter  Procedures  . POCT glycosylated hemoglobin (Hb A1C)   Meds ordered this encounter  Medications  . diclofenac Sodium (VOLTAREN) 1 % GEL    Sig: Apply 4 g topically 4 (four) times daily as needed (pain).    Dispense:  400 g    Refill:  3  . DISCONTD: HYDROcodone-acetaminophen (NORCO/VICODIN) 5-325 MG tablet    Sig: TAKE 1 TABLET BY MOUTH 3 TIMES DAILY AS NEEDED FOR moderate PAIN    Dispense:  90 tablet    Refill:  0  . DISCONTD: HYDROcodone-acetaminophen (NORCO/VICODIN) 5-325 MG tablet    Sig: TAKE 1 TABLET BY MOUTH 3 TIMES DAILY AS NEEDED FOR moderate PAIN    Dispense:  90 tablet    Refill:  0  . HYDROcodone-acetaminophen (NORCO/VICODIN) 5-325 MG tablet    Sig: TAKE 1 TABLET BY MOUTH 3 TIMES DAILY AS NEEDED FOR moderate PAIN    Dispense:  90 tablet    Refill:  0      Immunization History  Administered Date(s) Administered  . Fluad Quad(high Dose 65+) 06/19/2019  . Influenza, High Dose Seasonal PF 06/30/2017, 05/12/2018  . PFIZER SARS-COV-2 Vaccination 10/06/2019, 10/29/2019  . Pneumococcal Conjugate-13 05/12/2018  . Pneumococcal Polysaccharide-23 07/13/2019  . Tdap 06/15/2018  . Zoster Recombinat (Shingrix) 07/30/2019    Diabetes Related Lab Review: Lab Results  Component Value Date   HGBA1C  6.2 (A) 10/31/2019   HGBA1C 6.3 (A) 07/13/2019   HGBA1C 6.7 (H) 03/28/2019    No results found for: Derl Barrow Lab Results  Component Value Date   CREATININE 1.17 03/28/2019   BUN 36 (H) 03/28/2019   NA 140 03/28/2019   K 4.9 03/28/2019   CL 104 03/28/2019   CO2 25 03/28/2019   Lab Results  Component Value Date   CHOL 133 03/28/2019   CHOL 136 09/26/2018   CHOL 122 03/25/2017   Lab Results  Component Value Date   HDL 50.80 03/28/2019   HDL 53.10 09/26/2018   HDL 55.30 03/25/2017   Lab Results  Component Value Date   LDLCALC 58 03/28/2019   LDLCALC 61 09/26/2018   LDLCALC 44 03/25/2017   Lab  Results  Component Value Date   TRIG 120.0 03/28/2019   TRIG 110.0 09/26/2018   TRIG 113.0 03/25/2017   Lab Results  Component Value Date   CHOLHDL 3 03/28/2019   CHOLHDL 3 09/26/2018   CHOLHDL 2 03/25/2017   No results found for: LDLDIRECT The ASCVD Risk score Mikey Bussing DC Jr., et al., 2013) failed to calculate for the following reasons:   The 2013 ASCVD risk score is only valid for ages 69 to 78 I have reviewed the [place], Fam and Soc history. Patient Active Problem List   Diagnosis Date Noted  . Spinal stenosis of lumbar region at multiple levels 10/31/2019    Priority: High    Dr. Marlou Sa 2010 lumbar MRI, multilevel and severe. Failed cymbalta, PT, lyrica and gabapentin. [facility]ated at [facility] with epidural spinal injections   . Chronic narcotic dependence ([place]) 07/13/2019    Priority: High  . PAD (peripheral artery disease) ([place]) 11/18/2016    Priority: High    MILD: LE [place] on 04/29/16, mild arterial occlusive disease. Mild right iliac arterial disease and mild left distal popliteal disease. Right ABI 1.06, Left ABI 1.07.   Marland Kitchen Chronic midline low back pain with bilateral sciatica 10/02/2016    Priority: High  . Severe obesity (BMI 35.0-35.9 with comorbidity) ([place]) 10/02/2016    Priority: High    Improving since being home. The patient is asked to make an attempt to improve diet and exercise patterns to aid in medical management of this problem.    . Hyperlipidemia associated with type 2 diabetes mellitus ([place]), on Lipitor 10/02/2016    Priority: High  . Hypertension associated with diabetes ([place]), on Norvasc, Hyzaar, Lasix/K, Metoprolol, followed by Cardiology 03/08/2016    Priority: High    Medications reviewed and adjusted today.    . Diabetes mellitus type 2 in obese ([place]) 03/08/2016    Priority: High  . 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 Urinary incontinence in female, followed by GYN, did  not tolerate pessary 06/27/2018    Priority: Medium  . Microcytic anemia 03/08/2016    Priority: Medium  . LBBB (left bundle branch block) 11/18/2016    EKG 07/08/16: Sinus rhythm , rate 80, left axis deviation, left anterior fascicular block, left bundle branch block.   . 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 Paresthesia of both lower extremities 10/02/2016    Previously on Neurontin.     Social History: Patient  reports that she has never smoked. She has never used smokeless tobacco. She reports that she does not drink alcohol or use drugs.  Review of Systems: Ophthalmic: negative for  eye pain, loss of vision or double vision Cardiovascular: negative for chest pain Respiratory: negative for SOB or persistent cough Gastrointestinal: negative for abdominal pain Genitourinary: negative for dysuria or gross hematuria MSK: negative for foot lesions Neurologic: negative for weakness or gait disturbance  Objective  Vitals: BP 138/66 (BP Location: Right Arm, Patient Position: Sitting, Cuff Size: Large)   Pulse 89   Temp (!) 97.3 F (36.3 C) (Temporal)   Ht 5\' 2"  (1.575 m)   Wt 201 lb 6.4 oz (91.4 kg)   LMP  (LMP Unknown)   SpO2 99%   BMI 36.84 kg/m  General: well appearing, no acute distress  Psych:  Alert and oriented, normal mood and affect HEENT:  Normocephalic,  supple neck  Cardiovascular:  Nl S1 and S2, RRR without murmur, gallop or rub. no edema Respiratory:  Good breath sounds bilaterally, CTAB with normal effort, no rales Skin:  Warm,      Diabetic education: ongoing education regarding chronic disease management for diabetes was given today. We continue to reinforce the ABC's of diabetic management: A1c (<7 or 8 dependent upon patient), tight blood pressure control, and cholesterol management with goal LDL < 100 minimally. We discuss diet strategies, exercise recommendations, medication options and possible side effects. At each visit, we review recommended  immunizations and preventive care recommendations for diabetics and stress that good diabetic control can prevent other problems. See below for this patient's data.    Commons side effects, risks, benefits, and alternatives for medications and treatment plan prescribed today were discussed, and the patient expressed understanding of the given instructions. Patient is instructed to call or message via MyChart if he/she has any questions or concerns regarding our treatment plan. No barriers to understanding were identified. We discussed Red Flag symptoms and signs in detail. Patient expressed understanding regarding what to do in case of urgent or emergency type symptoms.   Medication list was reconciled, printed and provided to the patient in AVS. Patient instructions and summary information was reviewed with the patient as documented in the AVS. This note was prepared with assistance of Dragon voice recognition software. Occasional wrong-word or sound-a-like substitutions may have occurred due to the inherent limitations of voice recognition software  This visit occurred during the SARS-CoV-2 public health emergency.  Safety protocols were in place, including screening questions prior to the visit, additional usage of staff PPE, and extensive cleaning of exam room while observing appropriate contact time as indicated for disinfecting solutions.

## 2019-10-31 NOTE — Patient Instructions (Signed)
Please return in 3 months for recheck.  ° °If you have any questions or concerns, please don't hesitate to send me a message via MyChart or call the office at [phone]. Thank you for visiting with us today! It's our pleasure caring for you.  °

## 2019-11-07 ENCOUNTER — Encounter: Payer: Self-pay | Admitting: Podiatry

## 2019-11-07 ENCOUNTER — Ambulatory Visit: Payer: Medicare Other | Admitting: Podiatry

## 2019-11-07 ENCOUNTER — Other Ambulatory Visit: Payer: Self-pay

## 2019-11-07 DIAGNOSIS — D689 Coagulation defect, unspecified: Secondary | ICD-10-CM

## 2019-11-07 DIAGNOSIS — B351 Tinea unguium: Secondary | ICD-10-CM

## 2019-11-07 DIAGNOSIS — M79675 Pain in left toe(s): Secondary | ICD-10-CM | POA: Diagnosis not present

## 2019-11-07 DIAGNOSIS — M79674 Pain in right toe(s): Secondary | ICD-10-CM

## 2019-11-07 DIAGNOSIS — E1142 Type 2 diabetes mellitus with diabetic polyneuropathy: Secondary | ICD-10-CM

## 2019-11-07 NOTE — Progress Notes (Signed)
This patient returns to my office for at risk foot care.  This patient requires this care by a professional since this patient will be at risk due to having  Diabetes and PAD.  Patient is taking plavix.  This patient is unable to cut nails herself since the patient cannot reach their nails.These nails are painful walking and wearing shoes.  This patient presents for at risk foot care today.  General Appearance  Alert, conversant and in no acute stress.  Vascular  Dorsalis pedis and posterior tibial  pulses are palpable  bilaterally.  Capillary return is within normal limits  bilaterally. Temperature is within normal limits  bilaterally.  Neurologic  Senn-Weinstein monofilament wire absent   bilaterally. Muscle power within normal limits bilaterally.  Nails Thick disfigured discolored nails with subungual debris  from hallux to fifth toes bilaterally. No evidence of bacterial infection or drainage bilaterally.  Orthopedic  No limitations of motion  feet .  No crepitus or effusions noted.  No bony pathology or digital deformities noted.  Skin  normotropic skin with no porokeratosis noted bilaterally.  No signs of infections or ulcers noted.     Onychomycosis  Pain in right toes  Pain in left toes  Consent was obtained for treatment procedures.   Mechanical debridement of nails 1-5  bilaterally performed with a nail nipper.  Filed with dremel without incident. No infection or ulcer.     Return office visit  3 months        Told patient to return for periodic foot care and evaluation due to potential at risk complications.   Gardiner Barefoot DPM

## 2019-11-13 ENCOUNTER — Other Ambulatory Visit: Payer: Self-pay | Admitting: Family Medicine

## 2019-11-13 DIAGNOSIS — Z45018 Encounter for adjustment and management of other part of cardiac pacemaker: Secondary | ICD-10-CM | POA: Diagnosis not present

## 2019-11-13 DIAGNOSIS — I442 Atrioventricular block, complete: Secondary | ICD-10-CM | POA: Diagnosis not present

## 2019-11-13 DIAGNOSIS — E785 Hyperlipidemia, unspecified: Secondary | ICD-10-CM | POA: Diagnosis not present

## 2019-11-13 DIAGNOSIS — I10 Essential (primary) hypertension: Secondary | ICD-10-CM | POA: Diagnosis not present

## 2019-11-13 DIAGNOSIS — R202 Paresthesia of skin: Secondary | ICD-10-CM

## 2019-11-13 DIAGNOSIS — Z950 Presence of cardiac pacemaker: Secondary | ICD-10-CM | POA: Diagnosis not present

## 2019-11-13 DIAGNOSIS — E1169 Type 2 diabetes mellitus with other specified complication: Secondary | ICD-10-CM | POA: Diagnosis not present

## 2019-11-13 DIAGNOSIS — E1159 Type 2 diabetes mellitus with other circulatory complications: Secondary | ICD-10-CM | POA: Diagnosis not present

## 2019-11-13 DIAGNOSIS — R600 Localized edema: Secondary | ICD-10-CM

## 2019-11-21 ENCOUNTER — Encounter: Payer: Self-pay | Admitting: Cardiology

## 2019-12-11 ENCOUNTER — Other Ambulatory Visit: Payer: Self-pay | Admitting: Family Medicine

## 2019-12-11 DIAGNOSIS — R600 Localized edema: Secondary | ICD-10-CM

## 2019-12-11 DIAGNOSIS — R202 Paresthesia of skin: Secondary | ICD-10-CM

## 2019-12-20 ENCOUNTER — Other Ambulatory Visit: Payer: Self-pay

## 2019-12-20 ENCOUNTER — Ambulatory Visit: Payer: Medicare Other

## 2019-12-20 DIAGNOSIS — Z0000 Encounter for general adult medical examination without abnormal findings: Secondary | ICD-10-CM | POA: Diagnosis not present

## 2019-12-20 NOTE — Progress Notes (Signed)
This visit is being conducted via phone call due to the COVID-19 pandemic. This patient has given me verbal consent via phone to conduct this visit, patient states they are participating from their home address. Some vital signs may be absent or patient reported.   Patient identification: identified by name, DOB, and current address.  Location provider: [facility], Office Persons participating in the virtual visit: Denman George LPN, patient, and Dr. Billey Chang     Subjective:   Krista Clarke is a 79 y.o. female who presents for Medicare Annual (Subsequent) preventive examination.  Review of Systems:   Cardiac Risk Factors include: advanced age (>6men, >75 women);diabetes mellitus;dyslipidemia;hypertension    Objective:     Vitals: LMP  (LMP Unknown)   There is no height or weight on file to calculate BMI.  Advanced Directives 12/20/2019 03/28/2019 06/28/2018 05/30/2018 04/28/2017 10/03/2016 03/08/2016  Does Patient Have a Medical Advance Directive? Yes No No No No No No  Type of Advance Directive Living will;Healthcare Power of Attorney - - - - - -  Does patient want to make changes to medical advance directive? No - Patient declined - - - - - -  Copy of [place] in Chart? No - copy requested - - - - - -  Would patient like information on creating a medical advance directive? - - No - Patient declined No - Patient declined Yes (MAU/Ambulatory/Procedural Areas - Information given) - No - patient declined information    Tobacco Social History   Tobacco Use  Smoking Status Never Smoker  Smokeless Tobacco Never Used     Counseling given: Not Answered   Clinical Intake:  Pre-visit preparation completed: Yes  Pain : No/denies pain  Diabetes: Yes CBG done?: No Did pt. bring in CBG monitor from home?: No  How often do you need to have someone help you when you read instructions, pamphlets, or other written materials from your doctor or pharmacy?: 1 -  Never  Interpreter Needed?: No  Information entered by :: Denman George LPN  Past Medical History:  Diagnosis Date  . Anemia    takes iron daily  . Arthritis   . Complete heart block ([place]) 08/20/2015   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    . Diabetes mellitus without complication ([place])   . Encounter for care of pacemaker 05/24/2019  . Hypercholesteremia   . Hypertension   . 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 EGMs show AT/VP. There were 0 high ventricular rate episodes detected. Health trends do not demonstrate significant abnormality. Battery longevity is 10.4 - 11.2 years. RA pacing is 1.1 %, RV pacing is >99 %.  Scheduled  In office pacemaker check 07/12/19  Single (S)/Dual (D)/BV: D. Presenting ASVP @ 70/min.   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 Marlou Sa 2010 lumbar MRI, multilevel and severe.   Past Surgical History:  Procedure Laterality Date  . ABDOMINAL HYSTERECTOMY    . COLONOSCOPY WITH PROPOFOL N/A 07/03/2013   Procedure: COLONOSCOPY WITH PROPOFOL;  Surgeon: Garlan Fair, MD;  Location: [facility];  Service: Endoscopy;  Laterality: N/A;  . EP IMPLANTABLE DEVICE N/A 08/20/2015   Procedure: Pacemaker Implant;  Surgeon: Evans Lance, MD;  Location: [facility];  Service: Cardiovascular;  Laterality: N/A;  . JOINT REPLACEMENT Bilateral    '99. Knees  . LEFT HEART CATHETERIZATION WITH CORONARY ANGIOGRAM N/A 08/27/2014   Procedure: LEFT HEART CATHETERIZATION WITH CORONARY ANGIOGRAM;  Surgeon: Laverda Page, MD;  Location: [facility] CATH LAB;  Service: Cardiovascular;  Laterality: N/A;  . TUBAL LIGATION     Family History  Problem Relation Age of Onset  . Hypertension Mother   . Diabetes Mother   . Hypertension Father   . Cancer Brother    Social History    Socioeconomic History  . Marital status: Widowed    Spouse name: Not on file  . Number of children: 5  . Years of education: Not on file  . Highest education level: Not on file  Occupational History  . Occupation: Retired   Tobacco Use  . Smoking status: Never Smoker  . Smokeless tobacco: Never Used  Substance and Sexual Activity  . Alcohol use: No  . Drug use: No  . Sexual activity: Not Currently  Other Topics Concern  . Not on file  Social History Narrative   Lives alone   No longer drives    Has life alert system    Social Determinants of Health   Financial Resource Strain:   . Difficulty of Paying Living Expenses:   Food Insecurity:   . Worried About Charity fundraiser in the Last Year:   . Arboriculturist in the Last Year:   Transportation Needs:   . Film/video editor (Medical):   Marland Kitchen Lack of Transportation (Non-Medical):   Physical Activity:   . Days of Exercise per Week:   . Minutes of Exercise per Session:   Stress:   . Feeling of Stress :   Social Connections:   . Frequency of Communication with Friends and Family:   . Frequency of Social Gatherings with Friends and Family:   . Attends Religious Services:   . Active Member of Clubs or Organizations:   . Attends Archivist Meetings:   Marland Kitchen Marital Status:     Outpatient Encounter Medications as of 12/20/2019  Medication Sig  . allopurinol (ZYLOPRIM) 100 MG tablet TAKE 1 TABLET BY MOUTH EVERY MORNING  . amLODipine (NORVASC) 10 MG tablet TAKE 1 TABLET BY MOUTH EVERY DAY  . ascorbic acid (VITAMIN C) 500 MG tablet Take 1 tablet (500 mg total) by mouth daily.  . ASPIRIN LOW DOSE 81 MG chewable tablet CHEW ONE TABLET BY MOUTH EVERY DAY  . atorvastatin (LIPITOR) 40 MG tablet Take 1 tablet (40 mg total) by mouth daily.  . clopidogrel (PLAVIX) 75 MG tablet TAKE 1 TABLET BY MOUTH EVERY DAY  . diclofenac Sodium (VOLTAREN) 1 % GEL Apply 4 g topically 4 (four) times daily as needed (pain).  . FEROSUL  325 (65 Fe) MG tablet TAKE 1 TABLET BY MOUTH 2 TIMES DAILY  . FLUAD QUADRIVALENT 0.5 ML injection Inject 0.5 mLs into the muscle once.  . furosemide (LASIX) 20 MG tablet TAKE 1 TABLET BY MOUTH EVERY DAY  . gabapentin (NEURONTIN) 300 MG capsule TAKE 1 CAPSULE BY MOUTH 3 TIMES DAILY  . [START ON 01/13/2020] HYDROcodone-acetaminophen (NORCO/VICODIN) 5-325 MG tablet TAKE 1 TABLET BY MOUTH 3 TIMES DAILY AS NEEDED FOR moderate PAIN  . losartan (COZAAR) 50 MG tablet TAKE 1 TABLET BY MOUTH EVERY DAY  . Lutein 20 MG CAPS TAKE 1 CAPSULE BY MOUTH EVERY DAY  . magnesium oxide (MAG-OX) 400 MG tablet Take 1 tablet (400 mg total) by mouth  daily.  . metFORMIN (GLUCOPHAGE) 1000 MG tablet TAKE 1 TABLET BY MOUTH 2 TIMES DAILY WITH A MEAL  . metoprolol tartrate (LOPRESSOR) 25 MG tablet TAKE 1 TABLET BY MOUTH 2 TIMES DAILY IN THE MORNING AND AT BEDTIME  . omeprazole (PRILOSEC) 20 MG capsule Take 1 capsule (20 mg total) by mouth daily before breakfast.  . potassium chloride 20 MEQ TBCR Take 20 mEq by mouth daily. Take with Lasix  . vitamin C (ASCORBIC ACID) 500 MG tablet TAKE 1 TABLET BY MOUTH EVERY DAY AT NOON   No facility-administered encounter medications on file as of 12/20/2019.    Activities of Daily Living In your present state of health, do you have any difficulty performing the following activities: 12/20/2019 03/28/2019  Hearing? N N  Vision? N N  Difficulty concentrating or making decisions? N Y  Walking or climbing stairs? N Y  Dressing or bathing? N N  Doing errands, shopping? N Y  Conservation officer, nature and eating ? N Y  Using the Toilet? N N  In the past six months, have you accidently leaked urine? N N  Do you have problems with loss of bowel control? N N  Managing your Medications? N N  Managing your Finances? N N  Comment - Daughter helps  Housekeeping or managing your Housekeeping? N N  Comment - Daughter helps  Some recent data might be hidden    Patient Care Team: Leamon Arnt, MD as PCP  - General (Family Medicine) Adrian Prows, MD as Consulting Physician (Cardiology) [facility] [place] as Consulting Physician (Orthopedic Surgery) Melissa Noon, [place] as Referring Physician (Optometry) Bjorn Loser, MD as Consulting Physician (Urology) Gardiner Barefoot, DPM as Consulting Physician (Podiatry)    Assessment:   This is a routine wellness examination for Aimi.  Exercise Activities and Dietary recommendations Current Exercise Habits: The patient does not participate in regular exercise at present  Goals    . Exercise 3x per week (30 min per time)       Fall Risk Fall Risk  12/20/2019 07/13/2019 03/28/2019 03/28/2019 05/12/2018  Falls in the past year? 0 0 0 0 No  Comment - - - - pt uses walker   Number falls in past yr: 0 0 - - -  Injury with Fall? 0 0 - - -  Risk Factor Category  - - - - -  Risk for fall due to : Impaired balance/gait;Impaired mobility - - - -  Follow up Falls evaluation completed;Education provided;Falls prevention discussed - - - -  Comment - - - - -   Is the patient's home free of loose throw rugs in walkways, pet beds, electrical cords, etc?   yes      Grab bars in the bathroom? yes      Handrails on the stairs?   yes      Adequate lighting?   no   Depression Screen PHQ 2/9 Scores 12/20/2019 07/13/2019 03/28/2019 03/28/2019  PHQ - 2 Score 0 0 0 2  PHQ- 9 Score - - 2 4     Cognitive Function-no cognitive concerns at this time  MMSE - Mini Mental State Exam 04/28/2017  Orientation to time 5  Orientation to Place 5  Registration 3  Attention/ Calculation 3  Recall 1  Language- name 2 objects 2  Language- repeat 1  Language- follow 3 step command 3  Language- read & follow direction 1  Write a sentence 1  Copy design 1  Total score 26  6CIT Screen 12/20/2019  What Year? 0 points  What month? 0 points  What time? 0 points  Count back from 20 0 points  Months in reverse 0 points  Repeat phrase 0 points  Total Score  0    Immunization History  Administered Date(s) Administered  . Fluad Quad(high Dose 65+) 06/19/2019  . Influenza, High Dose Seasonal PF 06/30/2017, 05/12/2018  . PFIZER SARS-COV-2 Vaccination 10/06/2019, 10/29/2019  . Pneumococcal Conjugate-13 05/12/2018  . Pneumococcal Polysaccharide-23 07/13/2019  . Tdap 06/15/2018  . Zoster Recombinat (Shingrix) 07/30/2019    Qualifies for Shingles Vaccine? Aware that 2nd Shingrix needed by 01/27/20  Screening Tests Health Maintenance  Topic Date Due  . DEXA SCAN  01/31/2020  . FOOT EXAM  03/27/2020  . INFLUENZA VACCINE  03/30/2020  . HEMOGLOBIN A1C  05/02/2020  . OPHTHALMOLOGY EXAM  06/11/2020  . TETANUS/TDAP  06/15/2028  . COVID-19 Vaccine  Completed  . PNA vac Low Risk Adult  Completed    Cancer Screenings: Lung: Low Dose CT Chest recommended if Age 5-80 years, 30 pack-year currently smoking OR have quit w/in 15years. Patient does not qualify. Breast:  Up to date on Mammogram? Yes   Up to date of Bone Density/Dexa? No; patient will consider  Colorectal: No longer indicated     Plan:  I have personally reviewed and addressed the Medicare Annual Wellness questionnaire and have noted the following in the patient's chart:  A. Medical and social history B. Use of alcohol, tobacco or illicit drugs  C. Current medications and supplements D. Functional ability and status E.  Nutritional status F.  Physical activity G. Advance directives H. List of other physicians I.  Hospitalizations, surgeries, and ER visits in previous 12 months J.  [place] such as hearing and vision if needed, cognitive and depression L. Referrals, records requested, and appointments- none   In addition, I have reviewed and discussed with patient certain preventive protocols, quality metrics, and best practice recommendations. A written personalized care plan for preventive services as well as general preventive health recommendations were provided to  patient.   Signed,  Denman George, LPN  Nurse Health Advisor   Nurse Notes: no additional

## 2019-12-20 NOTE — Patient Instructions (Addendum)
Krista Clarke , Thank you for taking time to come for your Medicare Wellness Visit. I appreciate your ongoing commitment to your health goals. Please review the following plan we discussed and let me know if I can assist you in the future.   Screening recommendations/referrals: Colorectal Screening: up to date; no longer indicated  Mammogram: up to date; last 08/14/19 Bone Density: consider doing with your next mammogram to check for bone loss (osteoporosis)   Vision and Dental Exams: Recommended annual ophthalmology exams for early detection of glaucoma and other disorders of the eye Recommended annual dental exams for proper oral hygiene  Diabetic Exams: Diabetic Eye Exam: recommended yearly; up to date  Diabetic Foot Exam: recommended yearly; up to date   Vaccinations: Influenza vaccine: completed 06/19/19 Pneumococcal vaccine: up to date; last 07/13/19 Tdap vaccine: up to date; last 06/15/18  Shingles vaccine: 1st in vaccine completed 07/30/19; 2nd needed by 01/27/20  Covid vaccine:  Completed   Advanced directives: Please bring a copy of your POA (Power of [place]) and/or Living Will to your next appointment.  Goals:Recommend to drink at least 6-8 8oz glasses of water per day and consume a balanced diet rich in fresh fruits and vegetables.   Next appointment: Please schedule your Annual Wellness Visit with your Nurse Health Advisor in one year.  Preventive Care 85 Years and Older, Female Preventive care refers to lifestyle choices and visits with your health care provider that can promote health and wellness. What does preventive care include?  A yearly physical exam. This is also called an annual well check.  Dental exams once or twice a year.  Routine eye exams. Ask your health care provider how often you should have your eyes checked.  Personal lifestyle choices, including:  Daily care of your teeth and gums.  Regular physical activity.  Eating a healthy  diet.  Avoiding tobacco and drug use.  Limiting alcohol use.  Practicing safe sex.  Taking low-dose aspirin every day if recommended by your health care provider.  Taking vitamin and mineral supplements as recommended by your health care provider. What happens during an annual well check? The services and screenings done by your health care provider during your annual well check will depend on your age, overall health, lifestyle risk factors, and family history of disease. Counseling  Your health care provider may ask you questions about your:  Alcohol use.  Tobacco use.  Drug use.  Emotional well-being.  Home and relationship well-being.  Sexual activity.  Eating habits.  History of falls.  Memory and ability to understand (cognition).  Work and work Statistician.  Reproductive health. Screening  You may have the following tests or measurements:  Height, weight, and BMI.  Blood pressure.  Lipid and cholesterol levels. These may be checked every 5 years, or more frequently if you are over 60 years old.  Skin check.  Lung cancer screening. You may have this screening every year starting at age 69 if you have a 30-pack-year history of smoking and currently smoke or have quit within the past 15 years.  Fecal occult blood test (FOBT) of the stool. You may have this test every year starting at age 35.  Flexible sigmoidoscopy or colonoscopy. You may have a sigmoidoscopy every 5 years or a colonoscopy every 10 years starting at age 44.  Hepatitis C blood test.  Hepatitis B blood test.  Sexually transmitted disease (STD) testing.  Diabetes screening. This is done by checking your blood sugar (glucose) after you  have not eaten for a while (fasting). You may have this done every 1-3 years.  Bone density scan. This is done to screen for osteoporosis. You may have this done starting at age 15.  Mammogram. This may be done every 1-2 years. Talk to your health care  provider about how often you should have regular mammograms. Talk with your health care provider about your test results, treatment options, and if necessary, the need for more tests. Vaccines  Your health care provider may recommend certain vaccines, such as:  Influenza vaccine. This is recommended every year.  Tetanus, diphtheria, and acellular pertussis (Tdap, Td) vaccine. You may need a Td booster every 10 years.  Zoster vaccine. You may need this after age 40.  Pneumococcal 13-valent conjugate (PCV13) vaccine. One dose is recommended after age 60.  Pneumococcal polysaccharide (PPSV23) vaccine. One dose is recommended after age 67. Talk to your health care provider about which screenings and vaccines you need and how often you need them. This information is not intended to replace advice given to you by your health care provider. Make sure you discuss any questions you have with your health care provider. Document Released: 09/12/2015 Document Revised: 05/05/2016 Document Reviewed: 06/17/2015 Elsevier Interactive Patient Education  2017 [employer] Prevention in the Home Falls can cause injuries. They can happen to people of all ages. There are many things you can do to make your home safe and to help prevent falls. What can I do on the outside of my home?  Regularly fix the edges of walkways and driveways and fix any cracks.  Remove anything that might make you trip as you walk through a door, such as a raised step or threshold.  Trim any bushes or trees on the path to your home.  Use bright outdoor lighting.  Clear any walking paths of anything that might make someone trip, such as rocks or tools.  Regularly check to see if handrails are loose or broken. Make sure that both sides of any steps have handrails.  Any raised decks and porches should have guardrails on the edges.  Have any leaves, snow, or ice cleared regularly.  Use sand or salt on walking paths  during winter.  Clean up any spills in your garage right away. This includes oil or grease spills. What can I do in the bathroom?  Use night lights.  Install grab bars by the toilet and in the tub and shower. Do not use towel bars as grab bars.  Use non-skid mats or decals in the tub or shower.  If you need to sit down in the shower, use a plastic, non-slip stool.  Keep the floor dry. Clean up any water that spills on the floor as soon as it happens.  Remove soap buildup in the tub or shower regularly.  Attach bath mats securely with double-sided non-slip rug tape.  Do not have throw rugs and other things on the floor that can make you trip. What can I do in the bedroom?  Use night lights.  Make sure that you have a light by your bed that is easy to reach.  Do not use any sheets or blankets that are too big for your bed. They should not hang down onto the floor.  Have a firm chair that has side arms. You can use this for support while you get dressed.  Do not have throw rugs and other things on the floor that can make you trip. What  can I do in the kitchen?  Clean up any spills right away.  Avoid walking on wet floors.  Keep items that you use a lot in easy-to-reach places.  If you need to reach something above you, use a strong step stool that has a grab bar.  Keep electrical cords out of the way.  Do not use floor polish or wax that makes floors slippery. If you must use wax, use non-skid floor wax.  Do not have throw rugs and other things on the floor that can make you trip. What can I do with my stairs?  Do not leave any items on the stairs.  Make sure that there are handrails on both sides of the stairs and use them. Fix handrails that are broken or loose. Make sure that handrails are as long as the stairways.  Check any carpeting to make sure that it is firmly attached to the stairs. Fix any carpet that is loose or worn.  Avoid having throw rugs at the top  or bottom of the stairs. If you do have throw rugs, attach them to the floor with carpet tape.  Make sure that you have a light switch at the top of the stairs and the bottom of the stairs. If you do not have them, ask someone to add them for you. What else can I do to help prevent falls?  Wear shoes that:  Do not have high heels.  Have rubber bottoms.  Are comfortable and fit you well.  Are closed at the toe. Do not wear sandals.  If you use a stepladder:  Make sure that it is fully opened. Do not climb a closed stepladder.  Make sure that both sides of the stepladder are locked into place.  Ask someone to hold it for you, if possible.  Clearly mark and make sure that you can see:  Any grab bars or handrails.  First and last steps.  Where the edge of each step is.  Use tools that help you move around (mobility aids) if they are needed. These include:  Canes.  Walkers.  Scooters.  Crutches.  Turn on the lights when you go into a dark area. Replace any light bulbs as soon as they burn out.  Set up your furniture so you have a clear path. Avoid moving your furniture around.  If any of your floors are uneven, fix them.  If there are any pets around you, be aware of where they are.  Review your medicines with your doctor. Some medicines can make you feel dizzy. This can increase your chance of falling. Ask your doctor what other things that you can do to help prevent falls. This information is not intended to replace advice given to you by your health care provider. Make sure you discuss any questions you have with your health care provider. Document Released: 06/12/2009 Document Revised: 01/22/2016 Document Reviewed: 09/20/2014 Elsevier Interactive Patient Education  2017 [employer].

## 2019-12-31 ENCOUNTER — Ambulatory Visit: Payer: Medicare Other | Admitting: Physician Assistant

## 2019-12-31 ENCOUNTER — Telehealth: Payer: Self-pay | Admitting: Family Medicine

## 2019-12-31 NOTE — Telephone Encounter (Signed)
[name] called in stating that patient is fine.    States that she has spoken with her this morning and denies the patient having confusion. Denies vomiting.     States the only issue the patient is having is vertigo and that this is documented in her chart.    I have scheduled patient for an in office visit today with Inda Coke at 3:30pm.    Could not come sooner b/c all of patients family is currently at work.

## 2019-12-31 NOTE — Telephone Encounter (Signed)
Patient spoke with [facility] on 12/31/19 at 11:52am.  Patient states she has had vertigo, dizziness and nausea.  Patient is having difficulty standing on her own.  States she is taking dramamine but this is not helping.  States that patient is having confusion.   Was also informed of vomiting but this is not noted in the triage note.  It was suggested for patient to call 911 now.    Patient refused to call EMS stating she wanted a office appointment b/c she was afraid of [place].   Patient was transferred to office.  I informed patient that it is strongly suggested that she go to the ED.    I asked patient if I could call EMS for her.  Patient refused.   Patient stated she was going to call her daughter [name]) to see when her daughter would be able to take her to the ED.  I have tried to phone [name] a few times at the number we have on file for her.  Calls are going to VM.

## 2019-12-31 NOTE — Telephone Encounter (Signed)
Was finally able to get daughters phone to ring.  I have left daughter a VM to give our office a call back in regard.

## 2019-12-31 NOTE — Telephone Encounter (Signed)
Noted  

## 2019-12-31 NOTE — Telephone Encounter (Signed)
Please advise 

## 2020-01-01 ENCOUNTER — Ambulatory Visit (INDEPENDENT_AMBULATORY_CARE_PROVIDER_SITE_OTHER): Payer: Medicare Other | Admitting: Physician Assistant

## 2020-01-01 ENCOUNTER — Other Ambulatory Visit: Payer: Self-pay

## 2020-01-01 ENCOUNTER — Encounter: Payer: Self-pay | Admitting: Physician Assistant

## 2020-01-01 VITALS — BP 158/70 | HR 85 | Temp 98.2°F | Ht 62.0 in | Wt 196.2 lb

## 2020-01-01 DIAGNOSIS — R42 Dizziness and giddiness: Secondary | ICD-10-CM | POA: Diagnosis not present

## 2020-01-01 DIAGNOSIS — R5381 Other malaise: Secondary | ICD-10-CM

## 2020-01-01 DIAGNOSIS — R8279 Other abnormal findings on microbiological examination of urine: Secondary | ICD-10-CM | POA: Diagnosis not present

## 2020-01-01 LAB — GLUCOSE, POCT (MANUAL RESULT ENTRY): POC Glucose: 172 mg/dl — AB (ref 70–99)

## 2020-01-01 MED ORDER — MECLIZINE HCL 12.5 MG PO TABS: 12.5000 mg | ORAL_TABLET | Freq: Two times a day (BID) | ORAL | 0 refills | Status: DC | PRN

## 2020-01-01 NOTE — Progress Notes (Signed)
Krista Clarke is a 79 y.o. female here for a new problem.  I acted as a Education administrator for [employer], PA-C [employer], [place]  History of Present Illness:   Chief Complaint  Patient presents with  . Dizziness    HPI    Vertigo Patient is here for multiple complaints. She reports that starting Friday she had onset of dizziness (room spinning), nausea, vomiting, headache, decreased appetite, feeling more unsteady on her feet, and just "not feeling well." She has worsening symptoms with activity -- walking, sitting, lying down. She was also reportedly having confusion (per daughter -- see triage note). She was told to the go the ER but she didn't feel like she should and also, she is afraid of COVID-19. She is fully vaccinated.  She also endorses 3 episodes of diarrhea yesterday. Stool was dark (but states that this is her baseline since she takes oral iron.) Feels slightly better today than she did yesterday.   Denies: chest pain, SOB, changes in vision, unusual swelling in her legs, dysuria, concerns for dehydration  Has a HA 8/10 currently.    Wt Readings from Last 3 Encounters:  01/01/20 196 lb 3.2 oz (89 kg)  10/31/19 201 lb 6.4 oz (91.4 kg)  07/13/19 201 lb 6.4 oz (91.4 kg)   BP Readings from Last 3 Encounters:  01/01/20 (!) 158/70  10/31/19 138/66  07/13/19 122/70    Past Medical History:  Diagnosis Date  . Anemia    takes iron daily  . Arthritis   . Complete heart block ([place]) 08/20/2015   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    . Diabetes mellitus without complication ([place])   . Encounter for care of pacemaker 05/24/2019  . Hypercholesteremia   . Hypertension   . 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 EGMs show AT/VP. There were 0 high ventricular rate episodes detected. Health trends do not demonstrate  significant abnormality. Battery longevity is 10.4 - 11.2 years. RA pacing is 1.1 %, RV pacing is >99 %.  Scheduled  In office pacemaker check 07/12/19  Single (S)/Dual (D)/BV: D. Presenting ASVP @ 70/min.   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 Marlou Sa 2010 lumbar MRI, multilevel and severe.     Social History   Socioeconomic History  . Marital status: Widowed    Spouse name: Not on file  . Number of children: 5  . Years of education: Not on file  . Highest education level: Not on file  Occupational History  . Occupation: Retired   Tobacco Use  . Smoking status: Never Smoker  . Smokeless tobacco: Never Used  Substance and Sexual Activity  . Alcohol use: No  . Drug use: No  . Sexual activity: Not Currently  Other Topics Concern  . Not on file  Social History Narrative   Lives alone   No longer drives    Has life alert system    Social Determinants of Health   Financial Resource Strain:   . Difficulty of Paying Living Expenses:   Food Insecurity:   . Worried About Charity fundraiser in the Last Year:   . Arboriculturist in the Last Year:   Transportation Needs:   . Film/video editor (Medical):   Marland Kitchen Lack of  Transportation (Non-Medical):   Physical Activity:   . Days of Exercise per Week:   . Minutes of Exercise per Session:   Stress:   . Feeling of Stress :   Social Connections:   . Frequency of Communication with Friends and Family:   . Frequency of Social Gatherings with Friends and Family:   . Attends Religious Services:   . Active Member of Clubs or Organizations:   . Attends Archivist Meetings:   Marland Kitchen Marital Status:   Intimate Partner Violence:   . Fear of Current or Ex-Partner:   . Emotionally Abused:   Marland Kitchen Physically Abused:   . Sexually Abused:     Past Surgical History:  Procedure Laterality Date  . ABDOMINAL HYSTERECTOMY    . COLONOSCOPY WITH PROPOFOL N/A 07/03/2013   Procedure: COLONOSCOPY WITH PROPOFOL;   Surgeon: Garlan Fair, MD;  Location: [facility];  Service: Endoscopy;  Laterality: N/A;  . EP IMPLANTABLE DEVICE N/A 08/20/2015   Procedure: Pacemaker Implant;  Surgeon: Evans Lance, MD;  Location: [facility];  Service: Cardiovascular;  Laterality: N/A;  . JOINT REPLACEMENT Bilateral    '99. Knees  . LEFT HEART CATHETERIZATION WITH CORONARY ANGIOGRAM N/A 08/27/2014   Procedure: LEFT HEART CATHETERIZATION WITH CORONARY ANGIOGRAM;  Surgeon: Laverda Page, MD;  Location: [facility] CATH LAB;  Service: Cardiovascular;  Laterality: N/A;  . TUBAL LIGATION      Family History  Problem Relation Age of Onset  . Hypertension Mother   . Diabetes Mother   . Hypertension Father   . Cancer Brother     Allergies  Allergen Reactions  . Prednisone Palpitations and Other (See Comments)    Doesn't work well for patient    Current Medications:   Current Outpatient Medications:  .  allopurinol (ZYLOPRIM) 100 MG tablet, TAKE 1 TABLET BY MOUTH EVERY MORNING, Disp: 90 tablet, Rfl: 3 .  amLODipine (NORVASC) 10 MG tablet, TAKE 1 TABLET BY MOUTH EVERY DAY, Disp: 90 tablet, Rfl: 1 .  ascorbic acid (VITAMIN C) 500 MG tablet, Take 1 tablet (500 mg total) by mouth daily., Disp: 90 tablet, Rfl: 1 .  ASPIRIN LOW DOSE 81 MG chewable tablet, CHEW ONE TABLET BY MOUTH EVERY DAY, Disp: 90 tablet, Rfl: 3 .  atorvastatin (LIPITOR) 40 MG tablet, Take 1 tablet (40 mg total) by mouth daily., Disp: 90 tablet, Rfl: 3 .  clopidogrel (PLAVIX) 75 MG tablet, TAKE 1 TABLET BY MOUTH EVERY DAY, Disp: 90 tablet, Rfl: 3 .  diclofenac Sodium (VOLTAREN) 1 % GEL, Apply 4 g topically 4 (four) times daily as needed (pain)., Disp: 400 g, Rfl: 3 .  FEROSUL 325 (65 Fe) MG tablet, TAKE 1 TABLET BY MOUTH 2 TIMES DAILY, Disp: 90 tablet, Rfl: 3 .  FLUAD QUADRIVALENT 0.5 ML injection, Inject 0.5 mLs into the muscle once., Disp: , Rfl:  .  furosemide (LASIX) 20 MG tablet, TAKE 1 TABLET BY MOUTH EVERY DAY, Disp: 90 tablet, Rfl: 1 .   gabapentin (NEURONTIN) 300 MG capsule, TAKE 1 CAPSULE BY MOUTH 3 TIMES DAILY, Disp: 270 capsule, Rfl: 1 .  [START ON 01/13/2020] HYDROcodone-acetaminophen (NORCO/VICODIN) 5-325 MG tablet, TAKE 1 TABLET BY MOUTH 3 TIMES DAILY AS NEEDED FOR moderate PAIN, Disp: 90 tablet, Rfl: 0 .  losartan (COZAAR) 50 MG tablet, TAKE 1 TABLET BY MOUTH EVERY DAY, Disp: 90 tablet, Rfl: 3 .  Lutein 20 MG CAPS, TAKE 1 CAPSULE BY MOUTH EVERY DAY, Disp: 90 capsule, Rfl: 0 .  magnesium  oxide (MAG-OX) 400 MG tablet, Take 1 tablet (400 mg total) by mouth daily., Disp: 90 tablet, Rfl: 3 .  metFORMIN (GLUCOPHAGE) 1000 MG tablet, TAKE 1 TABLET BY MOUTH 2 TIMES DAILY WITH A MEAL, Disp: 180 tablet, Rfl: 3 .  metoprolol tartrate (LOPRESSOR) 25 MG tablet, TAKE 1 TABLET BY MOUTH 2 TIMES DAILY IN THE MORNING AND AT BEDTIME, Disp: 480 tablet, Rfl: 0 .  omeprazole (PRILOSEC) 20 MG capsule, Take 1 capsule (20 mg total) by mouth daily before breakfast., Disp: 30 capsule, Rfl: 3 .  potassium chloride 20 MEQ TBCR, Take 20 mEq by mouth daily. Take with Lasix, Disp: 90 tablet, Rfl: 3 .  meclizine (ANTIVERT) 12.5 MG tablet, Take 1 tablet (12.5 mg total) by mouth 2 (two) times daily as needed for dizziness., Disp: 30 tablet, Rfl: 0   Review of Systems:   ROS  Negative unless otherwise specified per HPI.  Vitals:   Vitals:   01/01/20 1459  BP: (!) 158/70  Pulse: 85  Temp: 98.2 F (36.8 C)  TempSrc: Temporal  SpO2: 96%  Weight: 196 lb 3.2 oz (89 kg)  Height: 5\' 2"  (1.575 m)     Body mass index is 35.89 kg/m.  Physical Exam:   Physical Exam Vitals and nursing note reviewed.  Constitutional:      General: She is not in acute distress.    Appearance: She is well-developed. She is not ill-appearing or toxic-appearing.  Cardiovascular:     Rate and Rhythm: Normal rate and regular rhythm.     Pulses: Normal pulses.     Heart sounds: Normal heart sounds, S1 normal and S2 normal.  Pulmonary:     Effort: Pulmonary effort is  normal.     Breath sounds: Normal breath sounds.  Musculoskeletal:     Right lower leg: 1+ Edema present.     Left lower leg: 1+ Edema present.  Skin:    General: Skin is warm and dry.  Neurological:     General: No focal deficit present.     Mental Status: She is alert.     GCS: GCS eye subscore is 4. GCS verbal subscore is 5. GCS motor subscore is 6.     Cranial Nerves: Cranial nerves are intact.     Sensory: Sensation is intact.     Motor: Motor function is intact.     Coordination: Coordination is intact.     Gait: Gait is intact.  Psychiatric:        Speech: Speech normal.        Behavior: Behavior normal. Behavior is cooperative.     Results for orders placed or performed in visit on 01/01/20  POCT Glucose (CBG)  Result Value Ref Range   POC Glucose 172 (A) 70 - 99 mg/dl    Assessment and Plan:   Korinna was seen today for dizziness.  Diagnoses and all orders for this visit:  Vertigo; Malaise Unable to fully assess for vertigo (via Marye Round) due to frailty. Limited neuro exam was normal. Given ongoing symptoms and overall malaise, I did recommend that patient go to the ER for further evaluation but she declined. She verbalized understanding of having worsening health outcomes due to not proceeding with more urgent evaluation. Will update labs, urine and provide prn meclizine. Did discuss that meclizine may cause sedation and could promote falls. Patient and daughter verbalized understanding to plan. Very low threshold for patient to go to the ER if symptoms worsen. -  CBC with Differential/Platelet -     Comprehensive metabolic panel -     TSH -     Urinalysis, Routine w reflex microscopic -     Urine Culture -     POCT Glucose (CBG)  Other orders -     meclizine (ANTIVERT) 12.5 MG tablet; Take 1 tablet (12.5 mg total) by mouth 2 (two) times daily as needed for dizziness.  . Reviewed expectations re: course of current medical issues. . Discussed  self-management of symptoms. . Outlined signs and symptoms indicating need for more acute intervention. . Patient verbalized understanding and all questions were answered. . See orders for this visit as documented in the electronic medical record. . Patient received an After-Visit Summary.  CMA or LPN served as scribe during this visit. History, Physical, and Plan performed by medical provider. The above documentation has been reviewed and is accurate and complete.  Inda Coke, PA-C

## 2020-01-01 NOTE — Patient Instructions (Signed)
It was great to see you!  I will be in touch with your lab results.  If you feel worse or anything changes --> please go to the ER.  May take meclizine for dizziness but this may make you sleepy. Please tell someone before you take this so we can prevent falls.  Take care,  Inda Coke PA-C

## 2020-01-02 ENCOUNTER — Other Ambulatory Visit: Payer: Self-pay | Admitting: Physician Assistant

## 2020-01-02 LAB — URINE CULTURE
MICRO NUMBER:: 10437262
Result:: NO GROWTH
SPECIMEN QUALITY:: ADEQUATE

## 2020-01-02 LAB — COMPREHENSIVE METABOLIC PANEL
ALT: 14 U/L (ref 0–35)
AST: 13 U/L (ref 0–37)
Albumin: 4.2 g/dL (ref 3.5–5.2)
Alkaline Phosphatase: 77 U/L (ref 39–117)
BUN: 23 mg/dL (ref 6–23)
CO2: 21 mEq/L (ref 19–32)
Calcium: 9.6 mg/dL (ref 8.4–10.5)
Chloride: 103 mEq/L (ref 96–112)
Creatinine, Ser: 1.06 mg/dL (ref 0.40–1.20)
GFR: 60.3 mL/min (ref >60.00)
Glucose, Bld: 164 mg/dL — ABNORMAL HIGH (ref 70–99)
Potassium: 4.7 mEq/L (ref 3.5–5.1)
Sodium: 137 mEq/L (ref 135–145)
Total Bilirubin: 0.3 mg/dL (ref 0.2–1.2)
Total Protein: 7.4 g/dL (ref 6.0–8.3)

## 2020-01-02 LAB — URINALYSIS, ROUTINE W REFLEX MICROSCOPIC
Bilirubin Urine: NEGATIVE
Ketones, ur: NEGATIVE
Nitrite: NEGATIVE
Specific Gravity, Urine: 1.02 (ref 1.000–1.030)
Urine Glucose: NEGATIVE
Urobilinogen, UA: 0.2 (ref 0.0–1.0)
pH: 6 (ref 5.0–8.0)

## 2020-01-02 LAB — CBC WITH DIFFERENTIAL/PLATELET
Basophils Absolute: 0 10*3/uL (ref 0.0–0.1)
Basophils Relative: 0.7 % (ref 0.0–3.0)
Eosinophils Absolute: 0.1 10*3/uL (ref 0.0–0.7)
Eosinophils Relative: 2.4 % (ref 0.0–5.0)
HCT: 33.8 % — ABNORMAL LOW (ref 36.0–46.0)
Hemoglobin: 10.5 g/dL — ABNORMAL LOW (ref 12.0–15.0)
Lymphocytes Relative: 24.9 % (ref 12.0–46.0)
Lymphs Abs: 1.3 10*3/uL (ref 0.7–4.0)
MCHC: 31.1 g/dL (ref 30.0–36.0)
MCV: 80.4 fl (ref 78.0–100.0)
Monocytes Absolute: 0.4 10*3/uL (ref 0.1–1.0)
Monocytes Relative: 8.8 % (ref 3.0–12.0)
Neutro Abs: 3.2 10*3/uL (ref 1.4–7.7)
Neutrophils Relative %: 63.2 % (ref 43.0–77.0)
Platelets: 310 10*3/uL (ref 150.0–400.0)
RBC: 4.21 Mil/uL (ref 3.87–5.11)
RDW: 16.4 % — ABNORMAL HIGH (ref 11.5–15.5)
WBC: 5.1 10*3/uL (ref 4.0–10.5)

## 2020-01-02 LAB — TSH: TSH: 4.09 u[IU]/mL (ref 0.35–4.50)

## 2020-01-02 MED ORDER — CEPHALEXIN 500 MG PO CAPS
500.0000 mg | ORAL_CAPSULE | Freq: Three times a day (TID) | ORAL | 0 refills | Status: DC
Start: 2020-01-02 — End: 2020-05-09

## 2020-01-07 ENCOUNTER — Other Ambulatory Visit: Payer: Self-pay | Admitting: Family Medicine

## 2020-01-07 DIAGNOSIS — I10 Essential (primary) hypertension: Secondary | ICD-10-CM

## 2020-01-07 DIAGNOSIS — E1159 Type 2 diabetes mellitus with other circulatory complications: Secondary | ICD-10-CM

## 2020-01-07 NOTE — Telephone Encounter (Signed)
error 

## 2020-01-21 ENCOUNTER — Emergency Department (HOSPITAL_COMMUNITY): Payer: Medicare Other

## 2020-01-21 ENCOUNTER — Other Ambulatory Visit: Payer: Self-pay

## 2020-01-21 ENCOUNTER — Emergency Department (HOSPITAL_COMMUNITY)
Admission: EM | Admit: 2020-01-21 | Discharge: 2020-01-21 | Disposition: A | Discharge status: Home / Self Care | Payer: Medicare Other | Attending: Emergency Medicine | Admitting: Emergency Medicine

## 2020-01-21 DIAGNOSIS — R0602 Shortness of breath: Secondary | ICD-10-CM | POA: Diagnosis not present

## 2020-01-21 DIAGNOSIS — R0789 Other chest pain: Secondary | ICD-10-CM | POA: Diagnosis not present

## 2020-01-21 DIAGNOSIS — Z743 Need for continuous supervision: Secondary | ICD-10-CM | POA: Diagnosis not present

## 2020-01-21 DIAGNOSIS — R55 Syncope and collapse: Secondary | ICD-10-CM

## 2020-01-21 DIAGNOSIS — R000 Tachycardia, unspecified: Secondary | ICD-10-CM | POA: Diagnosis not present

## 2020-01-21 DIAGNOSIS — R079 Chest pain, unspecified: Secondary | ICD-10-CM

## 2020-01-21 DIAGNOSIS — E119 Type 2 diabetes mellitus without complications: Secondary | ICD-10-CM | POA: Diagnosis not present

## 2020-01-21 DIAGNOSIS — Z79899 Other long term (current) drug therapy: Secondary | ICD-10-CM | POA: Diagnosis not present

## 2020-01-21 DIAGNOSIS — I10 Essential (primary) hypertension: Secondary | ICD-10-CM | POA: Diagnosis not present

## 2020-01-21 DIAGNOSIS — Z7982 Long term (current) use of aspirin: Secondary | ICD-10-CM | POA: Diagnosis not present

## 2020-01-21 DIAGNOSIS — Z950 Presence of cardiac pacemaker: Secondary | ICD-10-CM | POA: Diagnosis not present

## 2020-01-21 DIAGNOSIS — T679XXA Effect of heat and light, unspecified, initial encounter: Secondary | ICD-10-CM | POA: Diagnosis not present

## 2020-01-21 LAB — BASIC METABOLIC PANEL
Anion gap: 13 (ref 5–15)
BUN: 18 mg/dL (ref 8–23)
CO2: 23 mmol/L (ref 22–32)
Calcium: 9.6 mg/dL (ref 8.9–10.3)
Chloride: 105 mmol/L (ref 98–111)
Creatinine, Ser: 1.15 mg/dL — ABNORMAL HIGH (ref 0.44–1.00)
GFR calc Af Amer: 52 mL/min — ABNORMAL LOW (ref >60)
GFR calc non Af Amer: 45 mL/min — ABNORMAL LOW (ref >60)
Glucose, Bld: 215 mg/dL — ABNORMAL HIGH (ref 70–99)
Potassium: 4.5 mmol/L (ref 3.5–5.1)
Sodium: 141 mmol/L (ref 135–145)

## 2020-01-21 LAB — TROPONIN I (HIGH SENSITIVITY)
Troponin I (High Sensitivity): 4 ng/L (ref <18)
Troponin I (High Sensitivity): 5 ng/L (ref <18)

## 2020-01-21 LAB — CBC
HCT: 33.1 % — ABNORMAL LOW (ref 36.0–46.0)
Hemoglobin: 9.8 g/dL — ABNORMAL LOW (ref 12.0–15.0)
MCH: 24.7 pg — ABNORMAL LOW (ref 26.0–34.0)
MCHC: 29.6 g/dL — ABNORMAL LOW (ref 30.0–36.0)
MCV: 83.6 fL (ref 80.0–100.0)
Platelets: 296 10*3/uL (ref 150–400)
RBC: 3.96 MIL/uL (ref 3.87–5.11)
RDW: 15.5 % (ref 11.5–15.5)
WBC: 6.1 10*3/uL (ref 4.0–10.5)
nRBC: 0 % (ref 0.0–0.2)

## 2020-01-21 LAB — BRAIN NATRIURETIC PEPTIDE: B Natriuretic Peptide: 64.8 pg/mL (ref 0.0–100.0)

## 2020-01-21 MED ORDER — LIDOCAINE VISCOUS HCL 2 % MT SOLN
15.0000 mL | Freq: Once | OROMUCOSAL | Status: AC
  Administered 2020-01-21: 15 mL via ORAL
  Filled 2020-01-21: qty 15

## 2020-01-21 MED ORDER — ALUM & MAG HYDROXIDE-SIMETH 200-200-20 MG/5ML PO SUSP
30.0000 mL | Freq: Once | ORAL | Status: AC
  Administered 2020-01-21: 30 mL via ORAL
  Filled 2020-01-21: qty 30

## 2020-01-21 MED ORDER — SODIUM CHLORIDE 0.9% FLUSH: 3.0000 mL | Freq: Once | INTRAVENOUS | Status: DC

## 2020-01-21 NOTE — ED Triage Notes (Addendum)
Pt was at home tonight and had gotten really hot and when she stood she got very dizzy feeling. When sitting she feels fine. Pt said no vomiting no chest pain no sob.CBG 140

## 2020-01-21 NOTE — ED Provider Notes (Signed)
[facility] EMERGENCY DEPARTMENT Provider Note  CSN: [id number] Arrival date & time: 01/21/20 0038  Chief Complaint(s) Weakness (near syncopal)  HPI Krista Clarke is a 79 y.o. female   The history is provided by the patient and a relative.  Near Syncope Episode history:  Single Most recent episode:  Today Progression:  Resolved Chronicity:  New Context: standing up   Witnessed: yes   Relieved by: sitting. Worsened by:  Nothing Associated symptoms: chest pain and malaise/fatigue   Associated symptoms: no confusion, no diaphoresis, no difficulty breathing, no dizziness, no fever, no focal weakness, no nausea, no palpitations, no recent fall, no recent injury, no rectal bleeding, no shortness of breath, no visual change, no vomiting and no weakness   Chest pain:    Quality: dull     Severity:  Mild   Onset quality:  Sudden   Duration: several minutes.   Timing:  Constant   Progression:  Resolved   Chronicity:  Recurrent Risk factors comment:  H/o heart block s/p pacer H/o peripheral edema on lasix. No prior HF.   Past Medical History Past Medical History:  Diagnosis Date  . Anemia    takes iron daily  . Arthritis   . Complete heart block ([place]) 08/20/2015   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    . Diabetes mellitus without complication ([place])   . Encounter for care of pacemaker 05/24/2019  . Hypercholesteremia   . Hypertension   . 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 EGMs show AT/VP. There were 0 high ventricular rate episodes detected. Health trends do not demonstrate significant abnormality. Battery longevity is 10.4 - 11.2 years. RA pacing is 1.1 %, RV pacing is >99 %.  Scheduled  In office pacemaker check 07/12/19  Single (S)/Dual (D)/BV: D. Presenting ASVP @ 70/min.   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 Marlou Sa 2010 lumbar MRI, multilevel and severe.   Patient Active Problem List   Diagnosis Date Noted  . Spinal stenosis of lumbar region at multiple levels 10/31/2019  . Chronic narcotic dependence ([place]) 07/13/2019  . Urinary incontinence in female, followed by GYN, did not tolerate pessary 06/27/2018  . LBBB (left bundle branch block) 11/18/2016  . PAD (peripheral artery disease) ([place]) 11/18/2016  . 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 Chronic midline low back pain with bilateral sciatica 10/02/2016  . Paresthesia of both lower extremities 10/02/2016  . Severe obesity (BMI 35.0-35.9 with comorbidity) ([place]) 10/02/2016  . Hyperlipidemia associated with type 2 diabetes mellitus ([place]), on Lipitor 10/02/2016  . Hypertension associated with diabetes ([place]), on Norvasc, Hyzaar, Lasix/K, Metoprolol, followed by Cardiology 03/08/2016  . Microcytic anemia 03/08/2016  . Diabetes mellitus type 2 in obese ([place]) 03/08/2016  . Complete heart block ([place]) 08/20/2015  . 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, CHB 08/20/2015   Home Medication(s) Prior to Admission medications   Medication Sig Start Date End Date Taking? Authorizing Provider  acetaminophen (TYLENOL) 650 MG CR tablet Take 650 mg by mouth every 8 (eight) hours as needed for pain.   Yes [provider]  amLODipine (NORVASC) 10 MG tablet TAKE 1 TABLET BY MOUTH EVERY DAY Patient taking differently: Take 10 mg by mouth daily.  01/07/20  Yes Leamon Arnt, MD  ascorbic acid (VITAMIN C) 500 MG tablet Take 1 tablet (500 mg total) by mouth daily. 10/23/19  Yes Leamon Arnt, MD  ASPIRIN LOW DOSE 81 MG chewable tablet CHEW ONE TABLET BY MOUTH EVERY DAY Patient taking differently: Chew 81 mg by mouth daily.  07/12/19  Yes Leamon Arnt, MD  atorvastatin (LIPITOR) 40 MG tablet Take 1 tablet (40 mg total) by mouth daily. 07/24/19   Yes Leamon Arnt, MD  clopidogrel (PLAVIX) 75 MG tablet TAKE 1 TABLET BY MOUTH EVERY DAY Patient taking differently: Take 75 mg by mouth daily.  07/20/19  Yes Leamon Arnt, MD  diclofenac Sodium (VOLTAREN) 1 % GEL Apply 4 g topically 4 (four) times daily as needed (pain). 10/31/19  Yes Leamon Arnt, MD  FEROSUL 325 (65 Fe) MG tablet TAKE 1 TABLET BY MOUTH 2 TIMES DAILY Patient taking differently: Take 325 mg by mouth in the morning and at bedtime.  01/07/20  Yes Leamon Arnt, MD  furosemide (LASIX) 20 MG tablet TAKE 1 TABLET BY MOUTH EVERY DAY Patient taking differently: Take 20 mg by mouth daily.  01/08/19  Yes Briscoe Deutscher, DO  gabapentin (NEURONTIN) 300 MG capsule TAKE 1 CAPSULE BY MOUTH 3 TIMES DAILY Patient taking differently: Take 600 mg by mouth at bedtime.  05/28/19  Yes Briscoe Deutscher, DO  HYDROcodone-acetaminophen (NORCO/VICODIN) 5-325 MG tablet TAKE 1 TABLET BY MOUTH 3 TIMES DAILY AS NEEDED FOR moderate PAIN Patient taking differently: Take 1 tablet by mouth 3 (three) times daily as needed for moderate pain.  01/13/20  Yes Leamon Arnt, MD  losartan (COZAAR) 50 MG tablet TAKE 1 TABLET BY MOUTH EVERY DAY Patient taking differently: Take 50 mg by mouth daily.  07/12/19  Yes Leamon Arnt, MD  Lutein 20 MG CAPS TAKE 1 CAPSULE BY MOUTH EVERY DAY Patient taking differently: Take 1 capsule by mouth daily.  01/07/20  Yes Leamon Arnt, MD  magnesium oxide (MAG-OX) 400 MG tablet Take 1 tablet (400 mg total) by mouth daily. 07/24/19  Yes Leamon Arnt, MD  meclizine (ANTIVERT) 12.5 MG tablet Take 1 tablet (12.5 mg total) by mouth 2 (two) times daily as needed for dizziness. 01/01/20  Yes Inda Coke, PA  metFORMIN (GLUCOPHAGE) 1000 MG tablet TAKE 1 TABLET BY MOUTH 2 TIMES DAILY WITH A MEAL Patient taking differently: Take 500 mg by mouth 2 (two) times daily with a meal.  07/20/19  Yes Leamon Arnt, MD  metoprolol tartrate (LOPRESSOR) 25 MG tablet TAKE 1 TABLET BY MOUTH 2  TIMES DAILY IN THE MORNING AND AT BEDTIME Patient taking differently: Take 25 mg by mouth 2 (two) times daily.  07/12/19  Yes Leamon Arnt, MD  potassium chloride 20 MEQ TBCR Take 20 mEq by mouth daily. Take with Lasix 07/20/19  Yes Leamon Arnt, MD  allopurinol (ZYLOPRIM) 100 MG tablet TAKE 1 TABLET BY MOUTH EVERY MORNING Patient taking differently: Take 100 mg by mouth daily.  10/16/19   Leamon Arnt, MD  cephALEXin (KEFLEX) 500 MG capsule Take 1 capsule (500 mg total) by mouth 3 (three) times daily. Patient not taking: Reported on 01/21/2020 01/02/20   Inda Coke, PA  omeprazole (PRILOSEC) 20 MG capsule Take 1 capsule (20 mg total) by mouth daily before breakfast. Patient not taking: Reported on 01/21/2020 11/23/18   Adrian Prows, MD  Past Surgical History Past Surgical History:  Procedure Laterality Date  . ABDOMINAL HYSTERECTOMY    . COLONOSCOPY WITH PROPOFOL N/A 07/03/2013   Procedure: COLONOSCOPY WITH PROPOFOL;  Surgeon: Garlan Fair, MD;  Location: [facility] ENDOSCOPY;  Service: Endoscopy;  Laterality: N/A;  . EP IMPLANTABLE DEVICE N/A 08/20/2015   Procedure: Pacemaker Implant;  Surgeon: Evans Lance, MD;  Location: [facility] LAB;  Service: Cardiovascular;  Laterality: N/A;  . JOINT REPLACEMENT Bilateral    '99. Knees  . LEFT HEART CATHETERIZATION WITH CORONARY ANGIOGRAM N/A 08/27/2014   Procedure: LEFT HEART CATHETERIZATION WITH CORONARY ANGIOGRAM;  Surgeon: Laverda Page, MD;  Location: [facility] CATH LAB;  Service: Cardiovascular;  Laterality: N/A;  . TUBAL LIGATION     Family History Family History  Problem Relation Age of Onset  . Hypertension Mother   . Diabetes Mother   . Hypertension Father   . Cancer Brother     Social History Social History   Tobacco Use  . Smoking status: Never Smoker  . Smokeless tobacco: Never Used    Substance Use Topics  . Alcohol use: No  . Drug use: No   Allergies Prednisone  Review of Systems Review of Systems  Constitutional: Positive for malaise/fatigue. Negative for diaphoresis and fever.  Respiratory: Negative for shortness of breath.   Cardiovascular: Positive for chest pain and syncope. Negative for palpitations.  Gastrointestinal: Negative for nausea and vomiting.  Neurological: Negative for dizziness, focal weakness and weakness.  Psychiatric/Behavioral: Negative for confusion.   All other systems are reviewed and are negative for acute change except as noted in the HPI  Physical Exam Vital Signs  I have reviewed the triage vital signs BP (!) 146/71 (BP Location: Left Arm)   Pulse 81   Temp 98.7 F (37.1 C) (Oral)   Resp 18   Ht 5\' 4"  (1.626 m)   Wt 88.9 kg   LMP  (LMP Unknown)   SpO2 100%   BMI 33.64 kg/m   Physical Exam Vitals reviewed.  Constitutional:      General: She is not in acute distress.    Appearance: She is well-developed. She is obese. She is not diaphoretic.  HENT:     Head: Normocephalic and atraumatic.     Nose: Nose normal.  Eyes:     General: No scleral icterus.       Right eye: No discharge.        Left eye: No discharge.     Conjunctiva/sclera: Conjunctivae normal.     Pupils: Pupils are equal, round, and reactive to light.  Cardiovascular:     Rate and Rhythm: Normal rate and regular rhythm.     Heart sounds: No murmur. No friction rub. No gallop.   Pulmonary:     Effort: Pulmonary effort is normal. No respiratory distress.     Breath sounds: Normal breath sounds. No stridor. No rales.    Chest:     Chest wall: Tenderness present.    Abdominal:     General: There is no distension.     Palpations: Abdomen is soft.     Tenderness: There is no abdominal tenderness.  Musculoskeletal:        General: No tenderness.     Cervical back: Normal range of motion and neck supple.  Skin:    General: Skin is warm and dry.      Findings: No erythema or rash.  Neurological:     Mental Status: She is alert and oriented  to person, place, and time.     ED Results and Treatments Labs (all labs ordered are listed, but only abnormal results are displayed) Labs Reviewed  BASIC METABOLIC PANEL - Abnormal; Notable for the following components:      Result Value   Glucose, Bld 215 (*)    Creatinine, Ser 1.15 (*)    GFR calc non Af Amer 45 (*)    GFR calc Af Amer 52 (*)    All other components within normal limits  CBC - Abnormal; Notable for the following components:   Hemoglobin 9.8 (*)    HCT 33.1 (*)    MCH 24.7 (*)    MCHC 29.6 (*)    All other components within normal limits  BRAIN NATRIURETIC PEPTIDE  CBG MONITORING, ED  TROPONIN I (HIGH SENSITIVITY)  TROPONIN I (HIGH SENSITIVITY)                                                                                                                         EKG  EKG Interpretation  Date/Time:  Monday Jan 21 2020 00:40:20 EDT Ventricular Rate:  83 PR Interval:  186 QRS Duration: 146 QT Interval:  402 QTC Calculation: 472 R Axis:   -55 Text Interpretation: Atrial-sensed ventricular-paced rhythm Abnormal ECG When compared with ECG of 03/08/2016, No significant change was found Reconfirmed by Addison Lank [phone]) on 01/21/2020 2:24:38 AM      Repeat EKG:    Radiology DG Chest 2 View  Result Date: 01/21/2020 CLINICAL DATA:  Chest pain EXAM: CHEST - 2 VIEW COMPARISON:  Radiograph 03/07/2016 FINDINGS: Pacer pack overlies the left chest wall with leads directed towards the right atrium and cardiac apex, in similar position to comparison imaging. Lung volumes are low. Chronic elevation of the right hemidiaphragm. Some streaky basilar atelectasis in the lung bases. No consolidation, features of edema, pneumothorax, or effusion. The cardiomediastinal contours are unremarkable. No acute osseous or soft tissue abnormality. Degenerative changes are present in the  imaged spine and shoulders. Telemetry leads overlie the chest. IMPRESSION: 1. Low lung volumes with streaky basilar atelectasis. No other acute cardiopulmonary abnormality. 2. Chronic elevation of the right hemidiaphragm. Electronically Signed   By: Lovena Le M.D.   On: 01/21/2020 03:36    Pertinent labs & imaging results that were available during my care of the patient were reviewed by me and considered in my medical decision making (see chart for details).  Medications Ordered in ED Medications  sodium chloride flush (NS) 0.9 % injection 3 mL (3 mLs Intravenous Not Given 01/21/20 0225)  alum & mag hydroxide-simeth (MAALOX/MYLANTA) 200-200-20 MG/5ML suspension 30 mL (30 mLs Oral Given 01/21/20 0431)    And  lidocaine (XYLOCAINE) 2 % viscous mouth solution 15 mL (15 mLs Oral Given 01/21/20 0432)  Procedures Procedures  (including critical care time)  Medical Decision Making / ED Course I have reviewed the nursing notes for this encounter and the patient's prior records (if available in EHR or on provided paperwork).   MARYURI WARNKE was evaluated in Emergency Department on 01/21/2020 for the symptoms described in the history of present illness. She was evaluated in the context of the global COVID-19 pandemic, which necessitated consideration that the patient might be at risk for infection with the SARS-CoV-2 virus that causes COVID-19. Institutional protocols and algorithms that pertain to the evaluation of patients at risk for COVID-19 are in a state of rapid change based on information released by regulatory bodies including the CDC and federal and state organizations. These policies and algorithms were followed during the patient's care in the ED.  Near syncope - orthostatic in nature. Does report chest pain during the episode, which is similar to recurrent  indigestion pain for patient. Currently chest pain free. But pain recurred during assessment, then subsided quickly w/o intervention. Initial EKG with paced rhythm and LBBB - similar to prior. No acute ischemia. Initial trop negative.  Chest pain is atypical for ACS, but will obtain Delta trop.  Chest pain recurred again and this time subsided with GI cocktail.  Repeat EKG while pain was ongoing remained unchanged. Delta trop negative.  Patient also has reproducible pain, pointing to possible MSK related chest pain.  Doubt PE, dissection.  Chest x-ray without evidence suggestive of pneumonia, pneumothorax, pneumomediastinum.  No abnormal contour of the mediastinum to suggest dissection. No evidence of acute injuries.  She does have peripheral edema, but no pulmonary edema and wnl BNP. Doubt new HF.  Orthostatics were reassuring. She is no longer symptomatic.  Hb stable and no rectal bleeding. Patient has mildly elevated Cr, but no AKI. Has hyperglycemia w/o DKA or HHS No electrolyte derangements.  Patient was monitored for several hours w/o recurrence of near syncope or light headedness. She was able to ambulate briskly with walker.  Recommended close follow up with PCP and/or cardiologist.     Final Clinical Impression(s) / ED Diagnoses Final diagnoses:  Chest pain  Near syncope   The patient appears reasonably screened and/or stabilized for discharge and I doubt any other medical condition or other [facility] requiring further screening, evaluation, or treatment in the ED at this time prior to discharge. Safe for discharge with strict return precautions.  Disposition: Discharge  Condition: Good  I have discussed the results, Dx and Tx plan with the patient/family who expressed understanding and agree(s) with the plan. Discharge instructions discussed at length. The patient/family was given strict return precautions who verbalized understanding of the instructions. No further  questions at time of discharge.    ED Discharge Orders    None        Follow Up: Leamon Arnt, [place] [address] [phone]  Schedule an appointment as soon as possible for a visit    Adrian Prows, MD [place] [address] [phone]  Schedule an appointment as soon as possible for a visit        This chart was dictated using voice recognition software.  Despite best efforts to proofread,  errors can occur which can change the documentation meaning.   Fatima Blank, MD 01/21/20 [phone]

## 2020-01-21 NOTE — ED Notes (Signed)
EKG captured, physical copy handed to [place], MD. Providers aware system outage.

## 2020-01-23 ENCOUNTER — Telehealth: Payer: Self-pay

## 2020-01-23 NOTE — Telephone Encounter (Signed)
Patient states that she need a hospital f/u appt. Patient is already scheduled for 02/06/20 with Dr. Jonni Sanger. Patient aware that Dr. Jonni Sanger is completely booked and would like to know could she be seen before her scheduled appt

## 2020-01-23 NOTE — Telephone Encounter (Signed)
Please schedule patient on whatever day is convenient for her. Just make sure there are other openings that day for same day appts. ER discharge states to be seen ASAP

## 2020-01-24 NOTE — Telephone Encounter (Signed)
Patient scheduled 6/1 at 10am

## 2020-01-25 NOTE — Progress Notes (Signed)
Primary Physician/Referring:  Leamon Arnt, MD  Patient ID: Krista Clarke, female    DOB: 07-13-1941, 79 y.o.   MRN: [id number]  Chief Complaint  Patient presents with  . Hospitalization Follow-up  . Loss of Consciousness   HPI:    Krista Clarke  is a 79 y.o. AAF female  with  second-degree AV block and complete heart and S/P St. 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  Seen in the ED on 01/21/2020 with dizziness and chest pain and felt to be mildly orthostatic and chest pain felt to be musculoskeletal and GERD.  She has not had any recurrence of chest pain, no dizziness.  She is presently doing well except for occasional episodes of vertigo.  She is accompanied by her daughter today.  Past Medical History:  Diagnosis Date  . Anemia    takes iron daily  . Arthritis   . Complete heart block ([place]) 08/20/2015   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    . Diabetes mellitus without complication ([place])   . Encounter for care of pacemaker 05/24/2019  . Hypercholesteremia   . Hypertension   . 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 EGMs show AT/VP. There were 0 high ventricular rate episodes detected. Health trends do not demonstrate significant abnormality. Battery longevity is 10.4 - 11.2 years. RA pacing is 1.1 %, RV pacing is >99 %.  Scheduled  In office pacemaker check 07/12/19  Single (S)/Dual (D)/BV: D. Presenting ASVP @ 70/min.   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 Marlou Sa 2010 lumbar MRI, multilevel and severe.   Past Surgical History:  Procedure Laterality Date  . ABDOMINAL HYSTERECTOMY    . COLONOSCOPY WITH PROPOFOL N/A 07/03/2013   Procedure:  COLONOSCOPY WITH PROPOFOL;  Surgeon: Garlan Fair, MD;  Location: [facility];  Service: Endoscopy;  Laterality: N/A;  . EP IMPLANTABLE DEVICE N/A 08/20/2015   Procedure: Pacemaker Implant;  Surgeon: Evans Lance, MD;  Location: [place] [facility];  Service: Cardiovascular;  Laterality: N/A;  . JOINT REPLACEMENT Bilateral    '99. Knees  . LEFT HEART CATHETERIZATION WITH CORONARY ANGIOGRAM N/A 08/27/2014   Procedure: LEFT HEART CATHETERIZATION WITH CORONARY ANGIOGRAM;  Surgeon: Laverda Page, MD;  Location: [facility] CATH LAB;  Service: Cardiovascular;  Laterality: N/A;  . TUBAL LIGATION     Social History   Socioeconomic History  . Marital status: Widowed    Spouse name: Not on file  . Number of children: 5  . Years of education: Not on file  . Highest education level: Not on file  Occupational History  . Occupation: Retired   Tobacco Use  . Smoking status: Never Smoker  . Smokeless tobacco: Never Used  Substance and Sexual Activity  . Alcohol use: No  . Drug use: No  . Sexual activity: Not Currently  Other Topics Concern  . Not on file  Social History Narrative   Lives alone   No longer drives    Has life alert system    Social Determinants of Health   Financial Resource Strain:   . Difficulty of Paying Living Expenses:   Food Insecurity:   . Worried About Estate manager/land agent  of Food in the Last Year:   . [place] in the Last Year:   Transportation Needs:   . Lack of Transportation (Medical):   Marland Kitchen Lack of Transportation (Non-Medical):   Physical Activity:   . Days of Exercise per Week:   . Minutes of Exercise per Session:   Stress:   . Feeling of Stress :   Social Connections:   . Frequency of Communication with Friends and Family:   . Frequency of Social Gatherings with Friends and Family:   . Attends Religious Services:   . Active Member of Clubs or Organizations:   . Attends Archivist Meetings:   Marland Kitchen Marital Status:   Intimate Partner Violence:     . Fear of Current or Ex-Partner:   . Emotionally Abused:   Marland Kitchen Physically Abused:   . Sexually Abused:    ROS  Review of Systems  Cardiovascular: Positive for leg swelling (occasional). Negative for dyspnea on exertion and syncope.  Musculoskeletal: Positive for back pain and joint pain. Negative for joint swelling and muscle cramps. Arthritis: chronic.  Gastrointestinal: Negative for melena.  Neurological: Positive for disturbances in coordination (uses walker). Negative for headaches and light-headedness.  All other systems reviewed and are negative.  Objective   Vitals with BMI 01/30/2020 01/29/2020 01/21/2020  Height 5\' 4"  - -  Weight 198 lbs 198 lbs -  BMI 75.17 00.17 -  Systolic 494 496 759  Diastolic 70 70 74  Pulse 86 87 84    Orthostatic VS for the past 72 hrs (Last 3 readings):  Orthostatic BP Patient Position BP Location Cuff Size Orthostatic Pulse  01/30/20 1321 146/73 -- -- -- 97  01/30/20 1318 141/73 Sitting Right Arm Large 92  01/30/20 1315 136/64 Supine Right Arm Large 84  01/30/20 1314 -- Sitting Right Arm Large --     Physical Exam  Constitutional: No distress.  Morbidly obese  Neck:  Short neck and difficult to evaluate JVP  Cardiovascular: Normal rate, regular rhythm and normal heart sounds. Exam reveals no gallop.  No murmur heard. Pulses:      Carotid pulses are 2+ on the right side and 2+ on the left side.      Dorsalis pedis pulses are 1+ on the right side and 1+ on the left side.       Posterior tibial pulses are 0 on the right side and 0 on the left side.  Femoral and popliteal pulse difficult to feel due to patient's body habitus. Capillary refill normal. No skin breakdown. Skin is warm in the legs. No leg edema. No JVD.   Pulmonary/Chest: Effort normal and breath sounds normal.  Pacemaker pocket in the left infraclavicular region  Abdominal: Soft. Bowel sounds are normal.  Obese. Pannus present  Musculoskeletal:        General: Normal range of  motion.   Laboratory examination:   Recent Labs    03/28/19 1510 01/01/20 1535 01/21/20 0206  NA 140 137 141  K 4.9 4.7 4.5  CL 104 103 105  CO2 25 21 23   GLUCOSE 104* 164* 215*  BUN 36* 23 18  CREATININE 1.17 1.06 1.15*  CALCIUM 10.2 9.6 9.6  GFRNONAA  --   --  45*  GFRAA  --   --  52*   estimated creatinine clearance is 42.3 mL/min (A) (by C-G formula based on SCr of 1.15 mg/dL (H)).  CMP Latest Ref Rng & Units 01/21/2020 01/01/2020 03/28/2019  Glucose 70 -  99 mg/dL 215(H) 164(H) 104(H)  BUN 8 - 23 mg/dL 18 23 36(H)  Creatinine 0.44 - 1.00 mg/dL 1.15(H) 1.06 1.17  Sodium 135 - 145 mmol/L 141 137 140  Potassium 3.5 - 5.1 mmol/L 4.5 4.7 4.9  Chloride 98 - 111 mmol/L 105 103 104  CO2 22 - 32 mmol/L 23 21 25   Calcium 8.9 - 10.3 mg/dL 9.6 9.6 10.2  Total Protein 6.0 - 8.3 g/dL - 7.4 7.8  Total Bilirubin 0.2 - 1.2 mg/dL - 0.3 0.4  Alkaline Phos 39 - 117 U/L - 77 62  AST 0 - 37 U/L - 13 12  ALT 0 - 35 U/L - 14 10   CBC Latest Ref Rng & Units 01/21/2020 01/01/2020 03/28/2019  WBC 4.0 - 10.5 K/uL 6.1 5.1 6.8  Hemoglobin 12.0 - 15.0 g/dL 9.8(L) 10.5(L) 10.0(L)  Hematocrit 36.0 - 46.0 % 33.1(L) 33.8(L) 32.5(L)  Platelets 150 - 400 K/uL 296 310.0 293.0   Lipid Panel Recent Labs    03/28/19 1510  CHOL 133  TRIG 120.0  LDLCALC 58  VLDL 24.0  HDL 50.80  CHOLHDL 3   HEMOGLOBIN A1C Lab Results  Component Value Date   HGBA1C 6.2 (A) 10/31/2019   TSH Recent Labs    03/28/19 1510 01/01/20 1535  TSH 2.03 4.09   Medications and allergies   Allergies  Allergen Reactions  . Prednisone Palpitations and Other (See Comments)    Doesn't work well for patient     Current Outpatient Medications  Medication Instructions  . acetaminophen (TYLENOL) 650 mg, Oral, Every 8 hours PRN  . allopurinol (ZYLOPRIM) 100 MG tablet TAKE 1 TABLET BY MOUTH EVERY MORNING  . amLODipine (NORVASC) 10 MG tablet TAKE 1 TABLET BY MOUTH EVERY DAY  . ascorbic acid (VITAMIN C) 500 mg, Oral, Daily  .  ASPIRIN LOW DOSE 81 MG chewable tablet CHEW ONE TABLET BY MOUTH EVERY DAY  . atorvastatin (LIPITOR) 40 mg, Oral, Daily  . cephALEXin (KEFLEX) 500 mg, Oral, 3 times daily  . diclofenac Sodium (VOLTAREN) 4 g, Topical, 4 times daily PRN  . FEROSUL 325 (65 Fe) MG tablet TAKE 1 TABLET BY MOUTH 2 TIMES DAILY  . furosemide (LASIX) 20 MG tablet TAKE 1 TABLET BY MOUTH EVERY DAY  . gabapentin (NEURONTIN) 300 MG capsule TAKE 1 CAPSULE BY MOUTH 3 TIMES DAILY  . HYDROcodone-acetaminophen (NORCO/VICODIN) 5-325 MG tablet TAKE 1 TABLET BY MOUTH 3 TIMES DAILY AS NEEDED FOR moderate PAIN  . losartan (COZAAR) 50 MG tablet TAKE 1 TABLET BY MOUTH EVERY DAY  . Lutein 20 MG CAPS TAKE 1 CAPSULE BY MOUTH EVERY DAY  . magnesium oxide (MAG-OX) 400 mg, Oral, Daily  . meclizine (ANTIVERT) 12.5 mg, Oral, 2 times daily PRN  . metFORMIN (GLUCOPHAGE) 1000 MG tablet TAKE 1 TABLET BY MOUTH 2 TIMES DAILY WITH A MEAL  . metoprolol tartrate (LOPRESSOR) 25 MG tablet TAKE 1 TABLET BY MOUTH 2 TIMES DAILY IN THE MORNING AND AT BEDTIME  . omeprazole (PRILOSEC) 20 mg, Oral, Daily before breakfast  . potassium chloride 20 MEQ TBCR 20 mEq, Oral, Daily, Take with Lasix    Radiology:   DG Chest 2 View Result Date: 01/21/2020 CLINICAL DATA:  Chest pain EXAM: CHEST - 2 VIEW COMPARISON:  Radiograph 03/07/2016 FINDINGS: Pacer pack overlies the left chest wall with leads directed towards the right atrium and cardiac apex, in similar position to comparison imaging. Lung volumes are low. Chronic elevation of the right hemidiaphragm. Some streaky basilar atelectasis in the  lung bases. No consolidation, features of edema, pneumothorax, or effusion. The cardiomediastinal contours are unremarkable. No acute osseous or soft tissue abnormality. Degenerative changes are present in the imaged spine and shoulders. Telemetry leads overlie the chest. IMPRESSION: 1. Low lung volumes with streaky basilar atelectasis. No other acute cardiopulmonary abnormality. 2.  Chronic elevation of the right hemidiaphragm. Electronically Signed   By: Lovena Le M.D.   On: 01/21/2020 03:36   Cardiac Studies:   Nuclear stress test 07/16/2013:  1. Resting EKG NSR, LBBB. Stress EKG was non diagnostic for ischemia. No ADDITIONAL ST-T changes of ischemia noted with pharmacologic stress testing. Stress symptoms included lightheadedness, headache and chest pressure. Stress terminated due to completion of protocol. 2. The perfusion study demonstrated a very prominent breast attenuation artifact in the inferior, inferoseptal region, large size. Left ventricular ejection fraction was estimated to be 78%. This appearance is low risk study. Please see above for explanation.  Coronary angiogram 08/27/2014:  Normal coronary arteries. Severe tortuosity, suggests hypertensive heart disease. Normal LVEF.  Echocardiogram 12/11/2014:  1. Left ventricle cavity is normal in size. Mild concentric hypertrophy of the left ventricle. Normal global wall motion. Doppler evidence of grade II (pseudonormal) diastolic dysfunction. Calculated EF 63%. 2. Trace mitral regurgitation. Mild calcification of the mitral valve annulus. 3. Trace tricuspid regurgitation. IVC is normal with blunted respiratory response.  Lower Extremity Arterial Duplex 04/29/2016:  There is mild arterial occlusive disease. The above study suggests mild right iliac arterial disease and mild left distal popliteal or tibial disease. Right ABI 1.06, left ABI of 1.07  Scheduled Remote pacemaker check  11/13/2019: There were 0 atrial high rate episodes detected. There were 0 high ventricular rate episodes detected. Health trends do not demonstrate significant abnormality. Battery longevity is 10.5 years. RA pacing is 1.6 % and RV pacing is >99.0 %.  Scheduled  In office pacemaker check 07/12/19: Single (S)/Dual (D)/BV: D. Presenting ASVP @ 70/min. Pacemaker dependant:  Yes. Underlying CHB. AP 1%, VP 100%. BP NA%. AMS  Episodes None.  AT/AF burden None% . Longest NA. Latest NA. HVR None. Longevity 11.2 Years. Magnet rate: >85%. Lead measurements: Stable. Thoracic impedance: NA. Histogram: Low (L)/normal (N)/high (H)  Normal. Patient activity Good.    EKG:  EKG 01/30/2020: AV paced rhythm with rate of 84 bpm,  No further analysis.  01/21/2020: Atrial-sensed ventricular-paced rhythm. Abnormal ECG. When compared with ECG of 03/08/2016, No significant change was found.  Assessment     ICD-10-CM   1. Dizziness  R42 EKG 12-Lead  2. Diabetes mellitus type 2 with peripheral artery disease (HCC)  E11.51   3. Hiatal hernia with GERD  K21.9    K44.9   4. 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, CHB  Z95.0      Outpatient Encounter Medications as of 01/30/2020  Medication Sig  . acetaminophen (TYLENOL) 650 MG CR tablet Take 650 mg by mouth every 8 (eight) hours as needed for pain.  Marland Kitchen allopurinol (ZYLOPRIM) 100 MG tablet TAKE 1 TABLET BY MOUTH EVERY MORNING (Patient taking differently: Take 100 mg by mouth daily. )  . amLODipine (NORVASC) 10 MG tablet TAKE 1 TABLET BY MOUTH EVERY DAY (Patient taking differently: Take 10 mg by mouth daily. )  . ascorbic acid (VITAMIN C) 500 MG tablet Take 1 tablet (500 mg total) by mouth daily.  . ASPIRIN LOW DOSE 81 MG chewable tablet CHEW ONE TABLET BY MOUTH EVERY DAY (Patient taking differently: Sarina Ser  81 mg by mouth daily. )  . atorvastatin (LIPITOR) 40 MG tablet Take 1 tablet (40 mg total) by mouth daily.  . cephALEXin (KEFLEX) 500 MG capsule Take 1 capsule (500 mg total) by mouth 3 (three) times daily.  . diclofenac Sodium (VOLTAREN) 1 % GEL Apply 4 g topically 4 (four) times daily as needed (pain).  . FEROSUL 325 (65 Fe) MG tablet TAKE 1 TABLET BY MOUTH 2 TIMES DAILY (Patient taking differently: Take 325 mg by mouth in the morning and at bedtime. )  . furosemide (LASIX) 20 MG tablet TAKE 1 TABLET BY MOUTH EVERY DAY (Patient taking  differently: Take 20 mg by mouth daily. )  . gabapentin (NEURONTIN) 300 MG capsule TAKE 1 CAPSULE BY MOUTH 3 TIMES DAILY (Patient taking differently: Take 600 mg by mouth at bedtime. )  . HYDROcodone-acetaminophen (NORCO/VICODIN) 5-325 MG tablet TAKE 1 TABLET BY MOUTH 3 TIMES DAILY AS NEEDED FOR moderate PAIN (Patient taking differently: Take 1 tablet by mouth 3 (three) times daily as needed for moderate pain. )  . losartan (COZAAR) 50 MG tablet TAKE 1 TABLET BY MOUTH EVERY DAY (Patient taking differently: Take 50 mg by mouth daily. )  . Lutein 20 MG CAPS TAKE 1 CAPSULE BY MOUTH EVERY DAY (Patient taking differently: Take 1 capsule by mouth daily. )  . magnesium oxide (MAG-OX) 400 MG tablet Take 1 tablet (400 mg total) by mouth daily.  . meclizine (ANTIVERT) 12.5 MG tablet Take 1 tablet (12.5 mg total) by mouth 2 (two) times daily as needed for dizziness.  . metFORMIN (GLUCOPHAGE) 1000 MG tablet TAKE 1 TABLET BY MOUTH 2 TIMES DAILY WITH A MEAL (Patient taking differently: Take 500 mg by mouth 2 (two) times daily with a meal. )  . metoprolol tartrate (LOPRESSOR) 25 MG tablet TAKE 1 TABLET BY MOUTH 2 TIMES DAILY IN THE MORNING AND AT BEDTIME (Patient taking differently: Take 25 mg by mouth 2 (two) times daily. )  . omeprazole (PRILOSEC) 20 MG capsule Take 1 capsule (20 mg total) by mouth daily before breakfast.  . potassium chloride 20 MEQ TBCR Take 20 mEq by mouth daily. Take with Lasix  . [DISCONTINUED] clopidogrel (PLAVIX) 75 MG tablet TAKE 1 TABLET BY MOUTH EVERY DAY (Patient taking differently: Take 75 mg by mouth daily. )   No facility-administered encounter medications on file as of 01/30/2020.    Recommendations:    Krista Clarke  is a 79 y.o. AAF female  with  second-degree AV block and complete heart and S/P St. 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 Seen in  the ED on 01/21/2020 with dizziness and chest pain and felt to be mildly orthostatic and chest pain felt to be musculoskeletal and GERD. Seen in the ED on 01/21/2020 with dizziness and chest pain and felt to be mildly orthostatic and chest pain felt to be musculoskeletal and GERD.  I had seen her 6 months ago.  Pacemaker interrogation reveals normal function.  No changes were done today.  She has not had any atrial fibrillation. Blood pressure is very well controlled, no orthostatic changes today.  She is on appropriate medical therapy.  It appears that patient's blood pressure is well controlled continue the same  I suspect syncope associated with the patient and she was severely constipated from the restraints.  No syncopal spell.  Patient is Admitted.  She remains difficult stable, advised her to  hold off furosemide metoprolol succinate if she has not had a menstrual significant change.  I will see her back in 6 months  Adrian Prows, MD, [facility] 01/30/2020, 1:47 PM [facility] Pager: [phone] Office: [phone]

## 2020-01-29 ENCOUNTER — Other Ambulatory Visit: Payer: Self-pay

## 2020-01-29 ENCOUNTER — Encounter: Payer: Self-pay | Admitting: Family Medicine

## 2020-01-29 ENCOUNTER — Ambulatory Visit (INDEPENDENT_AMBULATORY_CARE_PROVIDER_SITE_OTHER): Payer: Medicare Other | Admitting: Family Medicine

## 2020-01-29 VITALS — BP 144/70 | HR 87 | Resp 15 | Wt 198.0 lb

## 2020-01-29 DIAGNOSIS — R202 Paresthesia of skin: Secondary | ICD-10-CM

## 2020-01-29 DIAGNOSIS — M5441 Lumbago with sciatica, right side: Secondary | ICD-10-CM

## 2020-01-29 DIAGNOSIS — R079 Chest pain, unspecified: Secondary | ICD-10-CM

## 2020-01-29 DIAGNOSIS — I739 Peripheral vascular disease, unspecified: Secondary | ICD-10-CM | POA: Diagnosis not present

## 2020-01-29 DIAGNOSIS — M5442 Lumbago with sciatica, left side: Secondary | ICD-10-CM | POA: Diagnosis not present

## 2020-01-29 DIAGNOSIS — G8929 Other chronic pain: Secondary | ICD-10-CM

## 2020-01-29 DIAGNOSIS — M48061 Spinal stenosis, lumbar region without neurogenic claudication: Secondary | ICD-10-CM

## 2020-01-29 DIAGNOSIS — F1120 Opioid dependence, uncomplicated: Secondary | ICD-10-CM

## 2020-01-29 NOTE — Progress Notes (Signed)
Subjective  CC:  Chief Complaint  Patient presents with  . ER follow up    was experiencing chest pains and "blacked out," patient has f/u scheduled with cardiology tomorrow    HPI: Krista Clarke is a 79 y.o. female who presents to the office today to address the problems listed above in the chief complaint.  CP and near syncope evaluated in ER. Notes reviewed. 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 Denies further episodes of cp. eval in ER unremarkable. To see her cardiologist tomorrow. Feels at her baseline.   Pain, chronic treated with narcotics by former PCP: due to severe lumbar stenosis and peripheral neuropathy. Pain is not well controlled. I reviewed old notes. Had been cared for by ortho and last MRI was 2010. C/o worsening weakness and difficulty walking. No b/b dysfunction or new pain. Had bee getting [facility] [facility] but had to stop short due to covid.   Denies adverse effects from norco although at times she admits it makes her sleepy.   Mild pad by ABI's and dopplers not likely contributing to her pain sxs.   DM: has f/u appt next week for recheck. Denies low sugars.   Assessment  1. Chest pain, unspecified type   2. Chronic bilateral low back pain with bilateral sciatica   3. Chronic narcotic dependence (HCC)   4. Paresthesia of both lower extremities   5. PAD (peripheral artery disease) ([place])   6. Spinal stenosis of lumbar region, unspecified whether neurogenic claudication present      Plan   Chest pain: to be evaluated by cards tomorrow. She is stable today  chroinc pain: on narcotics: high risk. Refer back to ortho for further eval and recs with treatment. Continue neurontin.   Duration of visit: 20 minutes plus 15 minutes of chart review regarding her pain issues/djd and spinal stenosis.  Mobility concerns: discussed goals of care. Pt lives independently w/o much family support. Gets mobile meals. Restart home health PT for strengthening, gait assessment and balance training.    Follow up: No follow-ups on file.  02/06/2020  Orders Placed This Encounter  Procedures  . Ambulatory referral to Orthopedic Surgery   No orders of the defined types were placed in this encounter.     I reviewed the patients updated PMH, FH, and SocHx.    Patient Active Problem List   Diagnosis Date Noted  . Lumbar stenosis 10/31/2019    Priority: High  . Chronic narcotic dependence ([place]) 07/13/2019    Priority: High  . PAD (peripheral artery disease) ([place]) 11/18/2016    Priority: High  . Chronic midline low back pain with bilateral sciatica 10/02/2016    Priority: High  . Severe obesity (BMI 35.0-35.9 with comorbidity) ([place]) 10/02/2016    Priority: High  . Hyperlipidemia associated with type 2 diabetes mellitus ([place]), on Lipitor 10/02/2016    Priority: High  . Hypertension associated with diabetes ([place]), on Norvasc, Hyzaar, Lasix/K, Metoprolol, followed by Cardiology 03/08/2016    Priority: High  . Diabetes mellitus type 2 in obese ([place]) 03/08/2016    Priority: High  . Complete heart block ([place]) 08/20/2015    Priority: High  . 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 Urinary incontinence in female, followed by GYN, did not tolerate pessary 06/27/2018    Priority: Medium  . Microcytic anemia 03/08/2016    Priority: Medium  . LBBB (left  bundle branch block) 11/18/2016  . 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 Paresthesia of both lower extremities 10/02/2016   Current Meds  Medication Sig  . acetaminophen (TYLENOL) 650 MG CR tablet Take 650 mg by mouth every 8 (eight) hours as needed for pain.  Marland Kitchen allopurinol (ZYLOPRIM) 100 MG tablet TAKE 1 TABLET BY MOUTH EVERY MORNING (Patient taking differently: Take 100 mg by mouth daily. )  . amLODipine (NORVASC) 10 MG tablet TAKE 1 TABLET BY MOUTH EVERY DAY (Patient taking differently: Take 10 mg by mouth daily. )  . ascorbic acid (VITAMIN C) 500  MG tablet Take 1 tablet (500 mg total) by mouth daily.  . ASPIRIN LOW DOSE 81 MG chewable tablet CHEW ONE TABLET BY MOUTH EVERY DAY (Patient taking differently: Chew 81 mg by mouth daily. )  . atorvastatin (LIPITOR) 40 MG tablet Take 1 tablet (40 mg total) by mouth daily.  . cephALEXin (KEFLEX) 500 MG capsule Take 1 capsule (500 mg total) by mouth 3 (three) times daily.  . clopidogrel (PLAVIX) 75 MG tablet TAKE 1 TABLET BY MOUTH EVERY DAY (Patient taking differently: Take 75 mg by mouth daily. )  . diclofenac Sodium (VOLTAREN) 1 % GEL Apply 4 g topically 4 (four) times daily as needed (pain).  . FEROSUL 325 (65 Fe) MG tablet TAKE 1 TABLET BY MOUTH 2 TIMES DAILY (Patient taking differently: Take 325 mg by mouth in the morning and at bedtime. )  . furosemide (LASIX) 20 MG tablet TAKE 1 TABLET BY MOUTH EVERY DAY (Patient taking differently: Take 20 mg by mouth daily. )  . gabapentin (NEURONTIN) 300 MG capsule TAKE 1 CAPSULE BY MOUTH 3 TIMES DAILY (Patient taking differently: Take 600 mg by mouth at bedtime. )  . HYDROcodone-acetaminophen (NORCO/VICODIN) 5-325 MG tablet TAKE 1 TABLET BY MOUTH 3 TIMES DAILY AS NEEDED FOR moderate PAIN (Patient taking differently: Take 1 tablet by mouth 3 (three) times daily as needed for moderate pain. )  . losartan (COZAAR) 50 MG tablet TAKE 1 TABLET BY MOUTH EVERY DAY (Patient taking differently: Take 50 mg by mouth daily. )  . Lutein 20 MG CAPS TAKE 1 CAPSULE BY MOUTH EVERY DAY (Patient taking differently: Take 1 capsule by mouth daily. )  . magnesium oxide (MAG-OX) 400 MG tablet Take 1 tablet (400 mg total) by mouth daily.  . meclizine (ANTIVERT) 12.5 MG tablet Take 1 tablet (12.5 mg total) by mouth 2 (two) times daily as needed for dizziness.  . metFORMIN (GLUCOPHAGE) 1000 MG tablet TAKE 1 TABLET BY MOUTH 2 TIMES DAILY WITH A MEAL (Patient taking differently: Take 500 mg by mouth 2 (two) times daily with a meal. )  . metoprolol tartrate (LOPRESSOR) 25 MG tablet TAKE 1  TABLET BY MOUTH 2 TIMES DAILY IN THE MORNING AND AT BEDTIME (Patient taking differently: Take 25 mg by mouth 2 (two) times daily. )  . omeprazole (PRILOSEC) 20 MG capsule Take 1 capsule (20 mg total) by mouth daily before breakfast.  . potassium chloride 20 MEQ TBCR Take 20 mEq by mouth daily. Take with Lasix    Allergies: Patient is allergic to prednisone. Family History: Patient family history includes Cancer in her brother; Diabetes in her mother; Hypertension in her father and mother. Social History:  Patient  reports that she has never smoked. She has never used smokeless tobacco. She reports that she does not drink alcohol or use drugs.  Review of Systems: Constitutional: Negative for fever malaise or anorexia Cardiovascular: negative for chest  pain Respiratory: negative for SOB or persistent cough Gastrointestinal: negative for abdominal pain  Objective  Vitals: BP (!) 144/70   Pulse 87   Resp 15   Wt 198 lb (89.8 kg)   LMP  (LMP Unknown)   SpO2 98%   BMI 33.99 kg/m  General: no acute distress , A&Ox3 HEENT: PEERL, conjunctiva normal, neck is supple Cardiovascular:  RRR without murmur or gallop.  Respiratory:  Good breath sounds bilaterally, CTAB with normal respiratory effort Skin:  Warm, no rashes     Commons side effects, risks, benefits, and alternatives for medications and treatment plan prescribed today were discussed, and the patient expressed understanding of the given instructions. Patient is instructed to call or message via MyChart if he/she has any questions or concerns regarding our treatment plan. No barriers to understanding were identified. We discussed Red Flag symptoms and signs in detail. Patient expressed understanding regarding what to do in case of urgent or emergency type symptoms.   Medication list was reconciled, printed and provided to the patient in AVS. Patient instructions and summary information was reviewed with the patient as documented in  the AVS. This note was prepared with assistance of Dragon voice recognition software. Occasional wrong-word or sound-a-like substitutions may have occurred due to the inherent limitations of voice recognition software  This visit occurred during the SARS-CoV-2 public health emergency.  Safety protocols were in place, including screening questions prior to the visit, additional usage of staff PPE, and extensive cleaning of exam room while observing appropriate contact time as indicated for disinfecting solutions.

## 2020-01-29 NOTE — Patient Instructions (Signed)
Please follow up as scheduled for your next visit with me: 02/06/2020   I will start [facility] and would like you to see a back doctor again to help clarify and help with your back and leg pain.   If you have any questions or concerns, please don't hesitate to send me a message via MyChart or call the office at [phone]. Thank you for visiting with [place] today! It's our pleasure caring for you.

## 2020-01-30 ENCOUNTER — Other Ambulatory Visit: Payer: Self-pay

## 2020-01-30 ENCOUNTER — Ambulatory Visit: Payer: Medicare Other | Admitting: Cardiology

## 2020-01-30 ENCOUNTER — Encounter: Payer: Self-pay | Admitting: Cardiology

## 2020-01-30 VITALS — BP 135/70 | HR 86 | Resp 16 | Ht 64.0 in | Wt 198.0 lb

## 2020-01-30 DIAGNOSIS — R42 Dizziness and giddiness: Secondary | ICD-10-CM | POA: Diagnosis not present

## 2020-01-30 DIAGNOSIS — E1151 Type 2 diabetes mellitus with diabetic peripheral angiopathy without gangrene: Secondary | ICD-10-CM | POA: Diagnosis not present

## 2020-01-30 DIAGNOSIS — M5441 Lumbago with sciatica, right side: Secondary | ICD-10-CM

## 2020-01-30 DIAGNOSIS — Z950 Presence of cardiac pacemaker: Secondary | ICD-10-CM | POA: Diagnosis not present

## 2020-01-30 DIAGNOSIS — K219 Gastro-esophageal reflux disease without esophagitis: Secondary | ICD-10-CM | POA: Diagnosis not present

## 2020-01-30 DIAGNOSIS — K449 Diaphragmatic hernia without obstruction or gangrene: Secondary | ICD-10-CM

## 2020-01-30 DIAGNOSIS — R202 Paresthesia of skin: Secondary | ICD-10-CM

## 2020-02-04 DIAGNOSIS — M48061 Spinal stenosis, lumbar region without neurogenic claudication: Secondary | ICD-10-CM | POA: Diagnosis not present

## 2020-02-04 DIAGNOSIS — M545 Low back pain: Secondary | ICD-10-CM | POA: Diagnosis not present

## 2020-02-05 ENCOUNTER — Other Ambulatory Visit: Payer: Self-pay | Admitting: Physical Medicine and Rehabilitation

## 2020-02-05 DIAGNOSIS — M48061 Spinal stenosis, lumbar region without neurogenic claudication: Secondary | ICD-10-CM

## 2020-02-06 ENCOUNTER — Ambulatory Visit (INDEPENDENT_AMBULATORY_CARE_PROVIDER_SITE_OTHER): Payer: Medicare Other | Admitting: Family Medicine

## 2020-02-06 ENCOUNTER — Encounter: Payer: Self-pay | Admitting: Family Medicine

## 2020-02-06 ENCOUNTER — Other Ambulatory Visit: Payer: Self-pay

## 2020-02-06 VITALS — BP 132/68 | HR 93 | Resp 16 | Ht 64.0 in | Wt 196.4 lb

## 2020-02-06 DIAGNOSIS — R202 Paresthesia of skin: Secondary | ICD-10-CM

## 2020-02-06 DIAGNOSIS — E669 Obesity, unspecified: Secondary | ICD-10-CM | POA: Diagnosis not present

## 2020-02-06 DIAGNOSIS — D649 Anemia, unspecified: Secondary | ICD-10-CM | POA: Diagnosis not present

## 2020-02-06 DIAGNOSIS — E785 Hyperlipidemia, unspecified: Secondary | ICD-10-CM | POA: Diagnosis not present

## 2020-02-06 DIAGNOSIS — E538 Deficiency of other specified B group vitamins: Secondary | ICD-10-CM

## 2020-02-06 DIAGNOSIS — E1169 Type 2 diabetes mellitus with other specified complication: Secondary | ICD-10-CM | POA: Diagnosis not present

## 2020-02-06 LAB — POCT GLYCOSYLATED HEMOGLOBIN (HGB A1C): Hemoglobin A1C: 6.2 % — AB (ref 4.0–5.6)

## 2020-02-06 MED ORDER — GABAPENTIN 300 MG PO CAPS: 300.0000 mg | ORAL_CAPSULE | Freq: Three times a day (TID) | ORAL | 1 refills | Status: DC

## 2020-02-06 NOTE — Patient Instructions (Signed)
Please return in 3 months for diabetes follow up. Please bring in all of your medications to that visit so I can see exactly what you are taking.   I have ordered gabapentin to restart. This should help your numbness/tingling and burning pain in your feet.  Please follow up with your back doctor.  We will work on getting home health PT out to your house.  Please start a vit B12 supplement daily. 1000mg  once a day. It is over the counter.    If you have any questions or concerns, please don't hesitate to send me a message via MyChart or call the office at [phone]. Thank you for visiting with [place] today! It's our pleasure caring for you.

## 2020-02-06 NOTE — Progress Notes (Signed)
Subjective  CC:  Chief Complaint  Patient presents with  . Diabetes  . Hypertension  . Dizziness    patient requesting script for dizziness, worsening over the last two years, meclizine has somewhat helped in the past    HPI: Krista Clarke is a 79 y.o. female who presents to the office today for follow up of diabetes and problems listed above in the chief complaint.   Diabetes follow up: Her diabetic control is reported as Unchanged. Taking metformin twice a day but unclear dose. She didn't bring in her pills with her today.  She denies exertional CP or SOB or symptomatic hypoglycemia. She c/o paresthesias - chart review: was to restart gabapentin but never did.   Lightheadedness and occ dizziness w/o neuro changes. High fall risk. Uses walker with seat.   Weakness: to start [facility] hasn't heard about the referral that I had placed last week yest.   Cards: cleared; doubt near syncope was cardiac related.   HTN improved today. On meds. .   Anemia with vit B12 deficiency and ckd. Hasn't started b12 supplements yes. Also with periph neuropathy.   HLD> goal ldl < 70  Saw NS last week; can't get MRI due to pacemaker. Has f/u for further treatment recs. Remains on norco tid.  Wt Readings from Last 3 Encounters:  02/06/20 196 lb 6.4 oz (89.1 kg)  01/30/20 198 lb (89.8 kg)  01/29/20 198 lb (89.8 kg)    BP Readings from Last 3 Encounters:  02/06/20 132/68  01/30/20 135/70  01/29/20 (!) 144/70    Assessment  1. Diabetes mellitus type 2 in obese ([place])   2. Hyperlipidemia associated with type 2 diabetes mellitus ([place])   3. Vitamin B12 deficiency   4. Normocytic anemia   5. Paresthesia of both lower extremities      Plan   Diabetes is currently very well controlled. No change in meds. Pt to check her dosing of meds at home and also to bring in pill bottles for my review to next visit. Add gabapentin for neuropathy sxs. Cautioned on somnelence as side effect  HLD recheck panel  today. On statin  Start b12 and recheck.   R/o iron deficiency.   Lightheadedness/dizzy: multifactorial. Avoid meclizine for now  Start home PT for balance and strengthening.   F/u with NS for back pain.   Follow up: No follow-ups on file.. Orders Placed This Encounter  Procedures  . Lipid panel  . Vitamin B12  . Iron, TIBC and Ferritin Panel  . CBC with Differential/Platelet  . Folate RBC  . POCT HgB A1C   Meds ordered this encounter  Medications  . gabapentin (NEURONTIN) 300 MG capsule    Sig: Take 1 capsule (300 mg total) by mouth 3 (three) times daily.    Dispense:  270 capsule    Refill:  1      Immunization History  Administered Date(s) Administered  . Fluad Quad(high Dose 65+) 06/19/2019  . Influenza, High Dose Seasonal PF 06/30/2017, 05/12/2018  . PFIZER SARS-COV-2 Vaccination 10/06/2019, 10/29/2019  . Pneumococcal Conjugate-13 05/12/2018  . Pneumococcal Polysaccharide-23 07/13/2019  . Tdap 06/15/2018  . Zoster Recombinat (Shingrix) 07/30/2019    Diabetes Related Lab Review: Lab Results  Component Value Date   HGBA1C 6.2 (A) 02/06/2020   HGBA1C 6.2 (A) 10/31/2019   HGBA1C 6.3 (A) 07/13/2019    No results found for: Derl Barrow Lab Results  Component Value Date   CREATININE 1.15 (H) 01/21/2020  BUN 18 01/21/2020   NA 141 01/21/2020   K 4.5 01/21/2020   CL 105 01/21/2020   CO2 23 01/21/2020   Lab Results  Component Value Date   CHOL 142 02/06/2020   CHOL 133 03/28/2019   CHOL 136 09/26/2018   Lab Results  Component Value Date   HDL 51.80 02/06/2020   HDL 50.80 03/28/2019   HDL 53.10 09/26/2018   Lab Results  Component Value Date   LDLCALC 61 02/06/2020   LDLCALC 58 03/28/2019   LDLCALC 61 09/26/2018   Lab Results  Component Value Date   TRIG 143.0 02/06/2020   TRIG 120.0 03/28/2019   TRIG 110.0 09/26/2018   Lab Results  Component Value Date   CHOLHDL 3 02/06/2020   CHOLHDL 3 03/28/2019   CHOLHDL 3 09/26/2018    No results found for: LDLDIRECT The ASCVD Risk score Mikey Bussing DC Jr., et al., 2013) failed to calculate for the following reasons:   The 2013 ASCVD risk score is only valid for ages 44 to 81 I have reviewed the [place], Fam and Soc history. Patient Active Problem List   Diagnosis Date Noted  . Lumbar stenosis 10/31/2019    Priority: High    Dr. Marlou Sa 2010 lumbar MRI, multilevel and severe. Failed cymbalta, PT, lyrica and gabapentin. Treated at [facility] with epidural spinal injections   . Chronic narcotic dependence ([place]) 07/13/2019    Priority: High  . PAD (peripheral artery disease) ([place]) 11/18/2016    Priority: High    MILD: LE [place] on 04/29/16, mild arterial occlusive disease. Mild right iliac arterial disease and mild left distal popliteal disease. Right ABI 1.06, Left ABI 1.07.   Marland Kitchen Chronic midline low back pain with bilateral sciatica 10/02/2016    Priority: High  . Severe obesity (BMI 35.0-35.9 with comorbidity) ([place]) 10/02/2016    Priority: High    Improving since being home. The patient is asked to make an attempt to improve diet and exercise patterns to aid in medical management of this problem.    . Hyperlipidemia associated with type 2 diabetes mellitus ([place]) 10/02/2016    Priority: High  . Hypertension associated with diabetes ([place]) 03/08/2016    Priority: High    Medications reviewed and adjusted today.    . Diabetes mellitus type 2 in obese ([place]) 03/08/2016    Priority: High  . History of complete heart block 08/20/2015    Priority: High    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 There were 0 high ventricular rate episodes detected. Health trends do not demonstrate significant abnormality. Battery longevity is 10.5 years. RA pacing  is 1.6 % and RV pacing is >99.0 %.   . Urinary incontinence in female, followed by GYN, did not tolerate pessary 06/27/2018    Priority: Medium  . LBBB (left bundle branch block) 11/18/2016    Priority: Medium    EKG 07/08/16: Sinus rhythm , rate 80, left axis deviation, left anterior fascicular block, left bundle branch block.   . 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 Paresthesia of both lower extremities 10/02/2016    Priority: Medium    Due to lumbar stenosis.neurontin   . Microcytic anemia 03/08/2016  Priority: Medium  . Vitamin B12 deficiency 02/06/2020    Priority: Low    Social History: Patient  reports that she has never smoked. She has never used smokeless tobacco. She reports that she does not drink alcohol and does not use drugs.  Review of Systems: Ophthalmic: negative for eye pain, loss of vision or double vision Cardiovascular: negative for chest pain Respiratory: negative for SOB or persistent cough Gastrointestinal: negative for abdominal pain Genitourinary: negative for dysuria or gross hematuria MSK: negative for foot lesions Neurologic: negative for weakness or gait disturbance  Objective  Vitals: BP 132/68   Pulse 93   Resp 16   Ht 5\' 4"  (1.626 m)   Wt 196 lb 6.4 oz (89.1 kg)   LMP  (LMP Unknown)   SpO2 98%   BMI 33.71 kg/m  General: well appearing, no acute distress  Psych:  Alert and oriented, normal mood and affect HEENT:  Normocephalic, atraumatic, moist mucous membranes, supple neck  Cardiovascular:  Nl S1 and S2, RRR  Respiratory:  Good breath sounds bilaterally, CTAB with normal effort, no rales Gastrointestinal: normal BS, soft, nontender  Diabetic education: ongoing education regarding chronic disease management for diabetes was given today. We continue to reinforce the ABC's of diabetic management: A1c (<7 or 8 dependent upon patient), tight blood pressure control, and cholesterol management with goal LDL <  100 minimally. We discuss diet strategies, exercise recommendations, medication options and possible side effects. At each visit, we review recommended immunizations and preventive care recommendations for diabetics and stress that good diabetic control can prevent other problems. See below for this patient's data.    Commons side effects, risks, benefits, and alternatives for medications and treatment plan prescribed today were discussed, and the patient expressed understanding of the given instructions. Patient is instructed to call or message via MyChart if he/she has any questions or concerns regarding our treatment plan. No barriers to understanding were identified. We discussed Red Flag symptoms and signs in detail. Patient expressed understanding regarding what to do in case of urgent or emergency type symptoms.   Medication list was reconciled, printed and provided to the patient in AVS. Patient instructions and summary information was reviewed with the patient as documented in the AVS. This note was prepared with assistance of Dragon voice recognition software. Occasional wrong-word or sound-a-like substitutions may have occurred due to the inherent limitations of voice recognition software  This visit occurred during the SARS-CoV-2 public health emergency.  Safety protocols were in place, including screening questions prior to the visit, additional usage of staff PPE, and extensive cleaning of exam room while observing appropriate contact time as indicated for disinfecting solutions.

## 2020-02-07 LAB — CBC WITH DIFFERENTIAL/PLATELET
Basophils Absolute: 0 10*3/uL (ref 0.0–0.1)
Basophils Relative: 0.7 % (ref 0.0–3.0)
Eosinophils Absolute: 0.1 10*3/uL (ref 0.0–0.7)
Eosinophils Relative: 1.5 % (ref 0.0–5.0)
HCT: 31.2 % — ABNORMAL LOW (ref 36.0–46.0)
Hemoglobin: 10 g/dL — ABNORMAL LOW (ref 12.0–15.0)
Lymphocytes Relative: 24.2 % (ref 12.0–46.0)
Lymphs Abs: 1.4 10*3/uL (ref 0.7–4.0)
MCHC: 32.1 g/dL (ref 30.0–36.0)
MCV: 78.2 fl (ref 78.0–100.0)
Monocytes Absolute: 0.5 10*3/uL (ref 0.1–1.0)
Monocytes Relative: 9.7 % (ref 3.0–12.0)
Neutro Abs: 3.6 10*3/uL (ref 1.4–7.7)
Neutrophils Relative %: 63.9 % (ref 43.0–77.0)
Platelets: 307 10*3/uL (ref 150.0–400.0)
RBC: 3.99 Mil/uL (ref 3.87–5.11)
RDW: 15.7 % — ABNORMAL HIGH (ref 11.5–15.5)
WBC: 5.6 10*3/uL (ref 4.0–10.5)

## 2020-02-07 LAB — LIPID PANEL
Cholesterol: 142 mg/dL (ref 0–200)
HDL: 51.8 mg/dL (ref >39.00)
LDL Cholesterol: 61 mg/dL (ref 0–99)
NonHDL: 90.02
Total CHOL/HDL Ratio: 3
Triglycerides: 143 mg/dL (ref 0.0–149.0)
VLDL: 28.6 mg/dL (ref 0.0–40.0)

## 2020-02-07 LAB — VITAMIN B12: Vitamin B-12: 262 pg/mL (ref 211–911)

## 2020-02-07 LAB — IRON,TIBC AND FERRITIN PANEL
%SAT: 22 % (calc) (ref 16–45)
Ferritin: 212 ng/mL (ref 16–288)
Iron: 56 ug/dL (ref 45–160)
TIBC: 255 mcg/dL (calc) (ref 250–450)

## 2020-02-07 LAB — FOLATE RBC: RBC Folate: 675 ng/mL RBC (ref >280)

## 2020-02-11 ENCOUNTER — Ambulatory Visit: Payer: Medicare Other | Admitting: Cardiology

## 2020-02-12 ENCOUNTER — Ambulatory Visit: Payer: Medicare Other | Admitting: Podiatry

## 2020-02-12 ENCOUNTER — Other Ambulatory Visit: Payer: Self-pay

## 2020-02-12 ENCOUNTER — Encounter: Payer: Self-pay | Admitting: Podiatry

## 2020-02-12 DIAGNOSIS — Z45018 Encounter for adjustment and management of other part of cardiac pacemaker: Secondary | ICD-10-CM | POA: Diagnosis not present

## 2020-02-12 DIAGNOSIS — E1142 Type 2 diabetes mellitus with diabetic polyneuropathy: Secondary | ICD-10-CM | POA: Diagnosis not present

## 2020-02-12 DIAGNOSIS — M79675 Pain in left toe(s): Secondary | ICD-10-CM | POA: Diagnosis not present

## 2020-02-12 DIAGNOSIS — M79674 Pain in right toe(s): Secondary | ICD-10-CM

## 2020-02-12 DIAGNOSIS — I442 Atrioventricular block, complete: Secondary | ICD-10-CM | POA: Diagnosis not present

## 2020-02-12 DIAGNOSIS — M2141 Flat foot [pes planus] (acquired), right foot: Secondary | ICD-10-CM | POA: Diagnosis not present

## 2020-02-12 DIAGNOSIS — D689 Coagulation defect, unspecified: Secondary | ICD-10-CM | POA: Diagnosis not present

## 2020-02-12 DIAGNOSIS — B351 Tinea unguium: Secondary | ICD-10-CM

## 2020-02-12 DIAGNOSIS — M2142 Flat foot [pes planus] (acquired), left foot: Secondary | ICD-10-CM

## 2020-02-12 DIAGNOSIS — Z950 Presence of cardiac pacemaker: Secondary | ICD-10-CM | POA: Diagnosis not present

## 2020-02-12 NOTE — Progress Notes (Signed)
This patient returns to my office for at risk foot care.  This patient requires this care by a professional since this patient will be at risk due to having  Diabetes and PAD.  Patient is taking plavix.  This patient is unable to cut nails herself since the patient cannot reach her HAV  B/L.  Pes planus. nails.These nails are painful walking and wearing shoes.  This patient presents for at risk foot care today.  General Appearance  Alert, conversant and in no acute stress.  Vascular  Dorsalis pedis and posterior tibial  pulses are palpable  bilaterally.  Capillary return is within normal limits  bilaterally. Temperature is within normal limits  bilaterally.  Neurologic  Senn-Weinstein monofilament wire absent   bilaterally. Muscle power within normal limits bilaterally.  Nails Thick disfigured discolored nails with subungual debris  from hallux to fifth toes bilaterally. No evidence of bacterial infection or drainage bilaterally.  Orthopedic  No limitations of motion  feet .  No crepitus or effusions noted.  No bony pathology or digital deformities noted.  Skin  normotropic skin with no porokeratosis noted bilaterally.  No signs of infections or ulcers noted.     Onychomycosis  Pain in right toes  Pain in left toes  Consent was obtained for treatment procedures.   Mechanical debridement of nails 1-5  bilaterally performed with a nail nipper.  Filed with dremel without incident. No infection or ulcer.     Return office visit  3 months        Told patient to return for periodic foot care and evaluation due to potential at risk complications.   Gardiner Barefoot DPM

## 2020-02-13 DIAGNOSIS — E785 Hyperlipidemia, unspecified: Secondary | ICD-10-CM | POA: Diagnosis not present

## 2020-02-13 DIAGNOSIS — E1159 Type 2 diabetes mellitus with other circulatory complications: Secondary | ICD-10-CM | POA: Diagnosis not present

## 2020-02-13 DIAGNOSIS — I10 Essential (primary) hypertension: Secondary | ICD-10-CM | POA: Diagnosis not present

## 2020-02-13 DIAGNOSIS — E1169 Type 2 diabetes mellitus with other specified complication: Secondary | ICD-10-CM | POA: Diagnosis not present

## 2020-02-18 ENCOUNTER — Other Ambulatory Visit: Payer: Self-pay | Admitting: Family Medicine

## 2020-02-18 DIAGNOSIS — E1159 Type 2 diabetes mellitus with other circulatory complications: Secondary | ICD-10-CM

## 2020-02-20 ENCOUNTER — Inpatient Hospital Stay: Admission: RE | Admit: 2020-02-20 | Payer: Medicare Other | Source: Ambulatory Visit

## 2020-02-21 ENCOUNTER — Ambulatory Visit
Admission: RE | Admit: 2020-02-21 | Discharge: 2020-02-21 | Disposition: A | Discharge status: Home / Self Care | Payer: Medicare Other | Source: Ambulatory Visit | Attending: Physical Medicine and Rehabilitation | Admitting: Physical Medicine and Rehabilitation

## 2020-02-21 DIAGNOSIS — M48061 Spinal stenosis, lumbar region without neurogenic claudication: Secondary | ICD-10-CM | POA: Diagnosis not present

## 2020-02-24 ENCOUNTER — Encounter: Payer: Self-pay | Admitting: Cardiology

## 2020-02-25 DIAGNOSIS — M48061 Spinal stenosis, lumbar region without neurogenic claudication: Secondary | ICD-10-CM | POA: Diagnosis not present

## 2020-02-25 DIAGNOSIS — M545 Low back pain: Secondary | ICD-10-CM | POA: Diagnosis not present

## 2020-02-27 DIAGNOSIS — M48061 Spinal stenosis, lumbar region without neurogenic claudication: Secondary | ICD-10-CM | POA: Diagnosis not present

## 2020-02-27 DIAGNOSIS — M545 Low back pain: Secondary | ICD-10-CM | POA: Diagnosis not present

## 2020-03-10 ENCOUNTER — Other Ambulatory Visit: Payer: Self-pay

## 2020-03-10 DIAGNOSIS — K219 Gastro-esophageal reflux disease without esophagitis: Secondary | ICD-10-CM

## 2020-03-10 MED ORDER — OMEPRAZOLE 20 MG PO CPDR: 20.0000 mg | DELAYED_RELEASE_CAPSULE | Freq: Every day | ORAL | 3 refills | Status: DC

## 2020-03-24 DIAGNOSIS — M545 Low back pain: Secondary | ICD-10-CM | POA: Diagnosis not present

## 2020-03-24 DIAGNOSIS — M48061 Spinal stenosis, lumbar region without neurogenic claudication: Secondary | ICD-10-CM | POA: Diagnosis not present

## 2020-03-31 ENCOUNTER — Other Ambulatory Visit: Payer: Self-pay | Admitting: Family Medicine

## 2020-03-31 DIAGNOSIS — M48061 Spinal stenosis, lumbar region without neurogenic claudication: Secondary | ICD-10-CM | POA: Diagnosis not present

## 2020-03-31 DIAGNOSIS — M545 Low back pain: Secondary | ICD-10-CM | POA: Diagnosis not present

## 2020-04-04 ENCOUNTER — Other Ambulatory Visit: Payer: Self-pay

## 2020-04-04 ENCOUNTER — Emergency Department (HOSPITAL_COMMUNITY)
Admission: EM | Admit: 2020-04-04 | Discharge: 2020-04-04 | Disposition: A | Discharge status: Home / Self Care | Payer: Medicare Other | Attending: Emergency Medicine | Admitting: Emergency Medicine

## 2020-04-04 ENCOUNTER — Emergency Department (HOSPITAL_BASED_OUTPATIENT_CLINIC_OR_DEPARTMENT_OTHER): Payer: Medicare Other

## 2020-04-04 ENCOUNTER — Emergency Department (HOSPITAL_COMMUNITY): Payer: Medicare Other

## 2020-04-04 ENCOUNTER — Encounter (HOSPITAL_COMMUNITY): Payer: Self-pay | Admitting: Emergency Medicine

## 2020-04-04 DIAGNOSIS — M79605 Pain in left leg: Secondary | ICD-10-CM | POA: Insufficient documentation

## 2020-04-04 DIAGNOSIS — Z7984 Long term (current) use of oral hypoglycemic drugs: Secondary | ICD-10-CM | POA: Insufficient documentation

## 2020-04-04 DIAGNOSIS — M4726 Other spondylosis with radiculopathy, lumbar region: Secondary | ICD-10-CM | POA: Insufficient documentation

## 2020-04-04 DIAGNOSIS — R52 Pain, unspecified: Secondary | ICD-10-CM | POA: Diagnosis not present

## 2020-04-04 DIAGNOSIS — G8929 Other chronic pain: Secondary | ICD-10-CM

## 2020-04-04 DIAGNOSIS — Z950 Presence of cardiac pacemaker: Secondary | ICD-10-CM | POA: Diagnosis not present

## 2020-04-04 DIAGNOSIS — Z79899 Other long term (current) drug therapy: Secondary | ICD-10-CM | POA: Diagnosis not present

## 2020-04-04 DIAGNOSIS — Z96653 Presence of artificial knee joint, bilateral: Secondary | ICD-10-CM | POA: Insufficient documentation

## 2020-04-04 DIAGNOSIS — M7989 Other specified soft tissue disorders: Secondary | ICD-10-CM

## 2020-04-04 DIAGNOSIS — E1169 Type 2 diabetes mellitus with other specified complication: Secondary | ICD-10-CM | POA: Insufficient documentation

## 2020-04-04 DIAGNOSIS — M545 Low back pain: Secondary | ICD-10-CM | POA: Insufficient documentation

## 2020-04-04 DIAGNOSIS — M79662 Pain in left lower leg: Secondary | ICD-10-CM | POA: Diagnosis not present

## 2020-04-04 DIAGNOSIS — I10 Essential (primary) hypertension: Secondary | ICD-10-CM | POA: Insufficient documentation

## 2020-04-04 DIAGNOSIS — R079 Chest pain, unspecified: Secondary | ICD-10-CM | POA: Diagnosis not present

## 2020-04-04 DIAGNOSIS — R600 Localized edema: Secondary | ICD-10-CM | POA: Insufficient documentation

## 2020-04-04 LAB — BASIC METABOLIC PANEL
Anion gap: 11 (ref 5–15)
BUN: 23 mg/dL (ref 8–23)
CO2: 27 mmol/L (ref 22–32)
Calcium: 9.6 mg/dL (ref 8.9–10.3)
Chloride: 100 mmol/L (ref 98–111)
Creatinine, Ser: 0.99 mg/dL (ref 0.44–1.00)
GFR calc Af Amer: >60 mL/min (ref >60)
GFR calc non Af Amer: 54 mL/min — ABNORMAL LOW (ref >60)
Glucose, Bld: 155 mg/dL — ABNORMAL HIGH (ref 70–99)
Potassium: 5.1 mmol/L (ref 3.5–5.1)
Sodium: 138 mmol/L (ref 135–145)

## 2020-04-04 LAB — CBC
HCT: 33.6 % — ABNORMAL LOW (ref 36.0–46.0)
Hemoglobin: 9.9 g/dL — ABNORMAL LOW (ref 12.0–15.0)
MCH: 24.6 pg — ABNORMAL LOW (ref 26.0–34.0)
MCHC: 29.5 g/dL — ABNORMAL LOW (ref 30.0–36.0)
MCV: 83.6 fL (ref 80.0–100.0)
Platelets: 308 10*3/uL (ref 150–400)
RBC: 4.02 MIL/uL (ref 3.87–5.11)
RDW: 14.8 % (ref 11.5–15.5)
WBC: 7.4 10*3/uL (ref 4.0–10.5)
nRBC: 0 % (ref 0.0–0.2)

## 2020-04-04 LAB — TROPONIN I (HIGH SENSITIVITY)
Troponin I (High Sensitivity): 3 ng/L (ref <18)
Troponin I (High Sensitivity): 5 ng/L (ref <18)

## 2020-04-04 LAB — BRAIN NATRIURETIC PEPTIDE: B Natriuretic Peptide: 150.8 pg/mL — ABNORMAL HIGH (ref 0.0–100.0)

## 2020-04-04 MED ORDER — SODIUM CHLORIDE 0.9% FLUSH: 3.0000 mL | Freq: Once | INTRAVENOUS | Status: DC

## 2020-04-04 MED ORDER — OXYCODONE-ACETAMINOPHEN 5-325 MG PO TABS
1.0000 | ORAL_TABLET | Freq: Once | ORAL | Status: AC
  Administered 2020-04-04: 1 via ORAL
  Filled 2020-04-04: qty 1

## 2020-04-04 MED ORDER — OXYCODONE-ACETAMINOPHEN 5-325 MG PO TABS: 1.0000 | ORAL_TABLET | ORAL | 0 refills | Status: DC | PRN

## 2020-04-04 MED ORDER — IBUPROFEN 400 MG PO TABS
400.0000 mg | ORAL_TABLET | Freq: Once | ORAL | Status: DC | PRN
  Filled 2020-04-04: qty 1

## 2020-04-04 NOTE — ED Provider Notes (Signed)
[facility] EMERGENCY DEPARTMENT Provider Note   CSN: [id number] Arrival date & time: 04/04/20  0241     History Chief Complaint  Patient presents with  . Chest Pain  . Leg Swelling    Krista Clarke is a 79 y.o. female.  HPI She presents for evaluation of pain in entire left leg present for 3 days, without trauma.  She had a routine injection of her back, 4 days ago for chronic back pain.  She has chronic swelling in both legs.  Last night when she went to bed she noticed some chest pain which has since resolved.  She denies shortness of breath, fever, chills, cough, nausea or vomiting.  She took her usual medications at home without relief of the left leg pain.  No prior history of VTE.    Past Medical History:  Diagnosis Date  . Anemia    takes iron daily  . Arthritis   . Complete heart block ([place]) 08/20/2015   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    . Diabetes mellitus without complication ([place])   . Encounter for care of pacemaker 05/24/2019  . Hypercholesteremia   . Hypertension   . 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 EGMs show AT/VP. There were 0 high ventricular rate episodes detected. Health trends do not demonstrate significant abnormality. Battery longevity is 10.4 - 11.2 years. RA pacing is 1.1 %, RV pacing is >99 %.  Scheduled  In office pacemaker check 07/12/19  Single (S)/Dual (D)/BV: D. Presenting ASVP @ 70/min.   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 Marlou Sa 2010 lumbar MRI, multilevel and severe.    Patient Active Problem List   Diagnosis Date Noted  . Vitamin B12 deficiency 02/06/2020  . Lumbar stenosis 10/31/2019  . Chronic narcotic dependence ([place]) 07/13/2019  . Encounter for care of pacemaker 05/24/2019  . Urinary incontinence in female,  followed by GYN, did not tolerate pessary 06/27/2018  . LBBB (left bundle branch block) 11/18/2016  . PAD (peripheral artery disease) ([place]) 11/18/2016  . 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 Chronic midline low back pain with bilateral sciatica 10/02/2016  . Paresthesia of both lower extremities 10/02/2016  . Severe obesity (BMI 35.0-35.9 with comorbidity) ([place]) 10/02/2016  . Hyperlipidemia associated with type 2 diabetes mellitus ([place]) 10/02/2016  . Hypertension associated with diabetes ([place]) 03/08/2016  . Microcytic anemia 03/08/2016  . Diabetes mellitus type 2 in obese ([place]) 03/08/2016  . History of complete heart block 08/20/2015  . 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, CHB 08/20/2015    Past Surgical History:  Procedure Laterality Date  . ABDOMINAL HYSTERECTOMY    . COLONOSCOPY WITH PROPOFOL N/A 07/03/2013   Procedure: COLONOSCOPY WITH PROPOFOL;  Surgeon: Garlan Fair, MD;  Location: [facility];  Service: Endoscopy;  Laterality: N/A;  . EP IMPLANTABLE DEVICE N/A 08/20/2015   Procedure: Pacemaker Implant;  Surgeon: Evans Lance, MD;  Location: [facility];  Service: Cardiovascular;  Laterality: N/A;  . JOINT REPLACEMENT Bilateral    '99. Knees  . LEFT HEART CATHETERIZATION WITH CORONARY ANGIOGRAM N/A 08/27/2014   Procedure: LEFT HEART CATHETERIZATION WITH CORONARY ANGIOGRAM;  Surgeon: Laverda Page, MD;  Location: [facility] CATH LAB;  Service: Cardiovascular;  Laterality: N/A;  . TUBAL LIGATION       OB History   No obstetric history on file.     Family History  Problem Relation Age of Onset  . Hypertension Mother   . Diabetes Mother   . Hypertension Father   . Cancer Brother     Social History   Tobacco Use  . Smoking status: Never Smoker  . Smokeless tobacco: Never Used  Vaping Use  . Vaping Use: Never used  Substance Use Topics  . Alcohol use: No  . Drug use: No    Home Medications Prior to  Admission medications   Medication Sig Start Date End Date Taking? Authorizing Provider  acetaminophen (TYLENOL) 650 MG CR tablet Take 650 mg by mouth every 8 (eight) hours as needed for pain.    [provider]  allopurinol (ZYLOPRIM) 100 MG tablet TAKE 1 TABLET BY MOUTH EVERY MORNING Patient taking differently: Take 100 mg by mouth daily.  10/16/19   Leamon Arnt, MD  amLODipine (NORVASC) 10 MG tablet TAKE 1 TABLET BY MOUTH EVERY DAY Patient taking differently: Take 10 mg by mouth daily.  01/07/20   Leamon Arnt, MD  ascorbic acid (VITAMIN C) 500 MG tablet TAKE 1 TABLET BY MOUTH EVERY DAY 03/31/20   Leamon Arnt, MD  ASPIRIN LOW DOSE 81 MG chewable tablet CHEW ONE TABLET BY MOUTH EVERY DAY Patient taking differently: Chew 81 mg by mouth daily.  07/12/19   Leamon Arnt, MD  atorvastatin (LIPITOR) 40 MG tablet Take 1 tablet (40 mg total) by mouth daily. 07/24/19   Leamon Arnt, MD  cephALEXin (KEFLEX) 500 MG capsule Take 1 capsule (500 mg total) by mouth 3 (three) times daily. 01/02/20   Inda Coke, PA  clopidogrel (PLAVIX) 75 MG tablet Take 75 mg by mouth daily. 02/04/20   [provider]  diclofenac Sodium (VOLTAREN) 1 % GEL Apply 4 g topically 4 (four) times daily as needed (pain). 10/31/19   Leamon Arnt, MD  FEROSUL 325 (65 Fe) MG tablet TAKE 1 TABLET BY MOUTH 2 TIMES DAILY Patient taking differently: Take 325 mg by mouth in the morning and at bedtime.  01/07/20   Leamon Arnt, MD  furosemide (LASIX) 20 MG tablet TAKE 1 TABLET BY MOUTH EVERY DAY Patient taking differently: Take 20 mg by mouth daily.  01/08/19   Briscoe Deutscher, DO  gabapentin (NEURONTIN) 300 MG capsule Take 1 capsule (300 mg total) by mouth 3 (three) times daily. 02/06/20   Leamon Arnt, MD  losartan (COZAAR) 50 MG tablet TAKE 1 TABLET BY MOUTH EVERY DAY Patient taking differently: Take 50 mg by mouth daily.  07/12/19   Leamon Arnt, MD  Lutein 20 MG CAPS TAKE 1 CAPSULE BY MOUTH EVERY DAY  03/31/20   Leamon Arnt, MD  magnesium oxide (MAG-OX) 400 MG tablet Take 1 tablet (400 mg total) by mouth daily. 07/24/19   Leamon Arnt, MD  Magnesium Oxide 400 (240 Mg) MG TABS Take 1 tablet by mouth daily. 03/05/20   [provider]  meclizine (ANTIVERT) 12.5 MG tablet Take 1 tablet (12.5 mg total) by mouth 2 (two) times daily as needed for dizziness. 01/01/20   Inda Coke, PA  metFORMIN (GLUCOPHAGE) 1000 MG tablet TAKE 1 TABLET BY MOUTH 2 TIMES DAILY WITH A MEAL Patient taking differently: Take 500 mg by mouth 2 (two) times daily with a meal.  07/20/19   Billey Chang  L, MD  metoprolol tartrate (LOPRESSOR) 25 MG tablet TAKE 1 TABLET BY MOUTH 2 TIMES DAILY IN THE MORNING AND AT BEDTIME 02/18/20   Leamon Arnt, MD  omeprazole (PRILOSEC) 20 MG capsule Take 1 capsule (20 mg total) by mouth daily before breakfast. 03/10/20   Adrian Prows, MD  oxyCODONE-acetaminophen (PERCOCET) 5-325 MG tablet Take 1 tablet by mouth every 4 (four) hours as needed for moderate pain or severe pain. 04/04/20   Daleen Bo, MD  potassium chloride 20 MEQ TBCR Take 20 mEq by mouth daily. Take with Lasix 07/20/19   Leamon Arnt, MD  potassium chloride SA (KLOR-CON) 20 MEQ tablet Take 20 mEq by mouth daily. 03/31/20   [provider]    Allergies    Prednisone  Review of Systems   Review of Systems  All other systems reviewed and are negative.   Physical Exam Updated Vital Signs BP (!) 151/75   Pulse 73   Temp 97.9 F (36.6 C) (Oral)   Resp 11   Ht 5\' 1"  (1.549 m)   Wt 90.3 kg   LMP  (LMP Unknown)   SpO2 100%   BMI 37.60 kg/m   Physical Exam Vitals and nursing note reviewed.  Constitutional:      General: She is not in acute distress.    Appearance: She is well-developed. She is obese. She is not ill-appearing, toxic-appearing or diaphoretic.  HENT:     Head: Normocephalic and atraumatic.     Right Ear: External ear normal.     Left Ear: External ear normal.  Eyes:      Conjunctiva/sclera: Conjunctivae normal.     Pupils: Pupils are equal, round, and reactive to light.  Neck:     Trachea: Phonation normal.  Cardiovascular:     Rate and Rhythm: Normal rate and regular rhythm.     Heart sounds: Normal heart sounds.  Pulmonary:     Effort: Pulmonary effort is normal.     Breath sounds: Normal breath sounds.  Abdominal:     Palpations: Abdomen is soft.     Tenderness: There is no abdominal tenderness.  Musculoskeletal:        General: Swelling (Bilateral legs) present. Normal range of motion.     Cervical back: Normal range of motion and neck supple.     Comments: Diffuse tenderness left leg; areas of most tenderness, medial thigh, lateral left lower, and calf regions.  Normal distal perfusion left foot, normal sensation left foot.  She is able to elevate the left leg, 3 to 4 cm off the stretcher and hold it there, as well as bend her left knee, but both maneuvers cause pain.  There is no pain in the left hip with active movement.  She is able to sit up easily, no visualized abnormality of the lower back or tenderness to palpation of the lower back.  Skin:    General: Skin is warm and dry.  Neurological:     Mental Status: She is alert and oriented to person, place, and time.     Cranial Nerves: No cranial nerve deficit.     Sensory: No sensory deficit.     Motor: No abnormal muscle tone.     Coordination: Coordination normal.  Psychiatric:        Behavior: Behavior normal.        Thought Content: Thought content normal.        Judgment: Judgment normal.     ED Results / Procedures /  Treatments   Labs (all labs ordered are listed, but only abnormal results are displayed) Labs Reviewed  BASIC METABOLIC PANEL - Abnormal; Notable for the following components:      Result Value   Glucose, Bld 155 (*)    GFR calc non Af Amer 54 (*)    All other components within normal limits  CBC - Abnormal; Notable for the following components:   Hemoglobin 9.9  (*)    HCT 33.6 (*)    MCH 24.6 (*)    MCHC 29.5 (*)    All other components within normal limits  BRAIN NATRIURETIC PEPTIDE - Abnormal; Notable for the following components:   B Natriuretic Peptide 150.8 (*)    All other components within normal limits  TROPONIN I (HIGH SENSITIVITY)  TROPONIN I (HIGH SENSITIVITY)    EKG EKG Interpretation  Date/Time:  Friday April 04 2020 02:57:26 EDT Ventricular Rate:  77 PR Interval:  192 QRS Duration: 146 QT Interval:  410 QTC Calculation: 463 R Axis:   -65 Text Interpretation: Atrial-sensed ventricular-paced rhythm Abnormal ECG since last tracing no significant change Confirmed by Daleen Bo [phone]) on 04/04/2020 8:27:15 AM   Radiology DG Chest 2 View  Result Date: 04/04/2020 CLINICAL DATA:  Chest pain EXAM: CHEST - 2 VIEW COMPARISON:  01/21/2020 FINDINGS: Left-sided pacing device as before. Chronic elevation of the right diaphragm. No consolidation or effusion. Stable cardiomediastinal silhouette. No pneumothorax. Degenerative changes of the spine. IMPRESSION: No active cardiopulmonary disease. Electronically Signed   By: Donavan Foil M.D.   On: 04/04/2020 03:21   VAS [place] LOWER EXTREMITY VENOUS (DVT) ([facility] 7a-7p)  Result Date: 04/04/2020  Lower Venous DVTStudy Indications: Pain, and Swelling.  Limitations: Body habitus and poor ultrasound/tissue interface. Comparison Study: Prior study 11-20-2016 RT LEV Performing Technologist: Darlin Coco  Examination Guidelines: A complete evaluation includes B-mode imaging, spectral Doppler, color Doppler, and power Doppler as needed of all accessible portions of each vessel. Bilateral testing is considered an integral part of a complete examination. Limited examinations for reoccurring indications may be performed as noted. The reflux portion of the exam is performed with the patient in reverse Trendelenburg.  +---------+---------------+---------+-----------+----------+-------------------+ RIGHT     CompressibilityPhasicitySpontaneityPropertiesThrombus Aging      +---------+---------------+---------+-----------+----------+-------------------+ CFV      Full           Yes      Yes                                      +---------+---------------+---------+-----------+----------+-------------------+ SFJ      Full                                                             +---------+---------------+---------+-----------+----------+-------------------+ FV Prox  Full                                                             +---------+---------------+---------+-----------+----------+-------------------+ FV Mid   Full                                                             +---------+---------------+---------+-----------+----------+-------------------+  FV DistalFull                                                             +---------+---------------+---------+-----------+----------+-------------------+ PFV      Full                                                             +---------+---------------+---------+-----------+----------+-------------------+ POP      Full           Yes      Yes                                      +---------+---------------+---------+-----------+----------+-------------------+ PTV      Full                                         Some segments not                                                         visualized          +---------+---------------+---------+-----------+----------+-------------------+ PERO     Full                                         Some segments not                                                         visualized          +---------+---------------+---------+-----------+----------+-------------------+   +---------+---------------+---------+-----------+----------+-------------------+ LEFT     CompressibilityPhasicitySpontaneityPropertiesThrombus Aging       +---------+---------------+---------+-----------+----------+-------------------+ CFV      Full           Yes      Yes                                      +---------+---------------+---------+-----------+----------+-------------------+ SFJ      Full                                                             +---------+---------------+---------+-----------+----------+-------------------+ FV Prox  Full                                                             +---------+---------------+---------+-----------+----------+-------------------+  FV Mid   Full                                                             +---------+---------------+---------+-----------+----------+-------------------+ FV DistalFull                                                             +---------+---------------+---------+-----------+----------+-------------------+ PFV      Full                                                             +---------+---------------+---------+-----------+----------+-------------------+ POP      Full           Yes      Yes                                      +---------+---------------+---------+-----------+----------+-------------------+ PTV      Full                                         Some segments not                                                         visualized          +---------+---------------+---------+-----------+----------+-------------------+ PERO     Full                                         Some segments not                                                         visualized          +---------+---------------+---------+-----------+----------+-------------------+   Summary: RIGHT: - There is no evidence of deep vein thrombosis in the lower extremity. However, portions of this examination were limited- see technologist comments above.  - No cystic structure found in the popliteal fossa.  LEFT: - There is no  evidence of deep vein thrombosis in the lower extremity. However, portions of this examination were limited- see technologist comments above.  - No cystic structure found in the popliteal fossa.  *See table(s) above for measurements and observations.    Preliminary     Procedures Procedures (including critical care time)  Medications Ordered in ED Medications  sodium chloride flush (NS)  0.9 % injection 3 mL (3 mLs Intravenous Not Given 04/04/20 1023)  ibuprofen (ADVIL) tablet 400 mg (has no administration in time range)  oxyCODONE-acetaminophen (PERCOCET/ROXICET) 5-325 MG per tablet 1 tablet (1 tablet Oral Given 04/04/20 0901)    ED Course  I have reviewed the triage vital signs and the nursing notes.  Pertinent labs & imaging results that were available during my care of the patient were reviewed by me and considered in my medical decision making (see chart for details).  Clinical Course as of Apr 04 1141  Fri Apr 04, 2020  0825 Normal except glucose high, GFR slightly low  Basic metabolic panel(!) [EW]  1638 Mild elevation  Brain natriuretic peptide(!) [EW]  0826 Normal except hemoglobin low, RBC indices low  CBC(!) [EW]  0826 No infiltrate or edema, interpreted by me  DG Chest 2 View [EW]  1048 Negative delta troponin  Troponin I (High Sensitivity) [EW]  1049 No DVT  VAS [place] LOWER EXTREMITY VENOUS (DVT) ([facility] 7a-7p) [EW]    Clinical Course User Index [EW] Daleen Bo, MD   MDM Rules/Calculators/A&P                           Patient Vitals for the past 24 hrs:  BP Temp Temp src Pulse Resp SpO2 Height Weight  04/04/20 1100 (!) 151/75 -- -- 73 11 100 % -- --  04/04/20 1000 (!) 144/77 -- -- 73 11 100 % -- --  04/04/20 0900 (!) 141/67 -- -- 74 12 100 % -- --  04/04/20 0809 (!) 145/74 -- -- 80 14 99 % -- --  04/04/20 0639 (!) 144/75 97.9 F (36.6 C) Oral 83 14 100 % 5\' 1"  (1.549 m) 90.3 kg  04/04/20 0251 (!) 158/81 98.7 F (37.1 C) Oral 82 16 100 % -- --    11:34  AM Reevaluation with update and discussion. After initial assessment and treatment, an updated evaluation reveals she has mild improvement of her pain after treatment with Percocet.  I had extensive discussion with the patient and her daughter regarding the cause of pain and expected outcome.  Findings discussed and questions answered. Daleen Bo   Medical Decision Making:  This patient is presenting for evaluation of back pain and leg pain, which does require a range of treatment options, and is a complaint that involves a high risk of morbidity and mortality. The differential diagnoses include DVT, lumbar radiculopathy, spinal myelopathy. I decided to review old records, and in summary elderly female, currently being treated with "back injections," 2 or 3, to treat severe degenerative changes in the lower back.  Symptoms worsen despite this treatment.  I obtained additional historical information from her daughter at the bedside.  Clinical Laboratory Tests Ordered, included CBC, Metabolic panel and Delta troponin, BNP. Review indicates hyperglycemia and anemia, near baseline.  Minimally elevated BNP     Critical Interventions-clinical evaluation, laboratory testing, medication treatment, observation reassessment  After These Interventions, the Patient was reevaluated and was found stable for discharge.  Patient prefers to try Percocet, since the hydrocodone she is taking is not giving her relief.  She is also willing to have home health services evaluate her in her home, for further treatment as needed.  She has been told that she will need a wheelchair, if her current treatment plan does not improve and I will order that.  [name] is in the room and is comfortable with this treatment  plan as is the patient.  It is suspected that the patient's pain is lumbar radiculopathy, due to severe degenerative changes lower back.  I doubt spinal myelopathy at this time.  No indication for  hospitalization at this time.  CRITICAL CARE-no Performed by: Daleen Bo  Nursing Notes Reviewed/ Care Coordinated Applicable Imaging Reviewed Interpretation of Laboratory Data incorporated into ED treatment  The patient appears reasonably screened and/or stabilized for discharge and I doubt any other medical condition or other [facility] requiring further screening, evaluation, or treatment in the ED at this time prior to discharge.  Plan: Home Medications-continue usual with exception of hydrocodone, hold both taking Percocet; Home Treatments-pleasure advance activity as tolerated, use wheelchair if needed; return here if the recommended treatment, does not improve the symptoms; Recommended follow up-PCP as scheduled, physiatry, as needed and as scheduled     Final Clinical Impression(s) / ED Diagnoses Final diagnoses:  Chronic bilateral low back pain, unspecified whether sciatica present  Pain of left lower extremity  Osteoarthritis of spine with radiculopathy, lumbar region    Rx / DC Orders ED Discharge Orders         Ordered    oxyCODONE-acetaminophen (PERCOCET) 5-325 MG tablet  Every 4 hours PRN     Discontinue  Reprint     04/04/20 1141           Daleen Bo, MD 04/04/20 1143

## 2020-04-04 NOTE — ED Triage Notes (Addendum)
Pt present to ED POV. Pt c/o bilateral LE edema and L side CP that radiates to arm. Pt also reports L leg pain. Pt states that she had dinner and went and laid down and CP began. Pt reports hx CHF, and compliance w/ meds. Pt has [employer]. Jude pacemaker

## 2020-04-04 NOTE — Discharge Instructions (Signed)
Your back and leg pain are likely related to your degenerative changes in your lower back.  We are prescribing a stronger pain reliever to use for period of time, as a trial.  Do not take the hydrocodone when taking the oxycodone.  Follow-up with Dr. Jonni Sanger, your PCP for ongoing management of your chronic pain.  We are going to have home health services come to your home to see about getting additional help, and have ordered a wheelchair.

## 2020-04-04 NOTE — Progress Notes (Signed)
Lower extremity venous bilateral study completed.   Results given to Eulis Foster, MD.  See Cv Proc for preliminary results.   Darlin Coco

## 2020-04-29 ENCOUNTER — Telehealth: Payer: Self-pay

## 2020-04-29 NOTE — Telephone Encounter (Signed)
She is seeing NS; has she contacted his office to help with pain medication?

## 2020-04-29 NOTE — Telephone Encounter (Signed)
Has seen orthopedic surgeon, did have shots in her back. States it helped temporarily, but was told she would need surgery. Specialist advised to contact PCP for further refills of pain medication.

## 2020-04-29 NOTE — Telephone Encounter (Signed)
Please advise. Patient seen in ED on 04/04/20 for lower back and leg pain. Was given #20, 0 percocet. Patient is scheduled for 3 month follow up on 9/10

## 2020-04-29 NOTE — Telephone Encounter (Signed)
Pt is requesting medication for severe back and leg pain.

## 2020-04-30 MED ORDER — HYDROCODONE-ACETAMINOPHEN 5-325 MG PO TABS: 1.0000 | ORAL_TABLET | Freq: Three times a day (TID) | ORAL | 0 refills | Status: DC | PRN

## 2020-04-30 NOTE — Telephone Encounter (Signed)
i've ordered pain medicines.

## 2020-04-30 NOTE — Telephone Encounter (Signed)
Patient notified

## 2020-05-09 ENCOUNTER — Other Ambulatory Visit: Payer: Self-pay

## 2020-05-09 ENCOUNTER — Encounter: Payer: Self-pay | Admitting: Family Medicine

## 2020-05-09 ENCOUNTER — Ambulatory Visit (INDEPENDENT_AMBULATORY_CARE_PROVIDER_SITE_OTHER): Payer: Medicare Other | Admitting: Family Medicine

## 2020-05-09 VITALS — BP 156/98 | HR 100 | Temp 98.4°F | Ht 64.0 in | Wt 199.0 lb

## 2020-05-09 DIAGNOSIS — E669 Obesity, unspecified: Secondary | ICD-10-CM

## 2020-05-09 DIAGNOSIS — Z23 Encounter for immunization: Secondary | ICD-10-CM

## 2020-05-09 DIAGNOSIS — R202 Paresthesia of skin: Secondary | ICD-10-CM

## 2020-05-09 DIAGNOSIS — E1159 Type 2 diabetes mellitus with other circulatory complications: Secondary | ICD-10-CM | POA: Diagnosis not present

## 2020-05-09 DIAGNOSIS — F1120 Opioid dependence, uncomplicated: Secondary | ICD-10-CM | POA: Diagnosis not present

## 2020-05-09 DIAGNOSIS — I10 Essential (primary) hypertension: Secondary | ICD-10-CM

## 2020-05-09 DIAGNOSIS — M48061 Spinal stenosis, lumbar region without neurogenic claudication: Secondary | ICD-10-CM | POA: Diagnosis not present

## 2020-05-09 DIAGNOSIS — Z6835 Body mass index (BMI) 35.0-35.9, adult: Secondary | ICD-10-CM

## 2020-05-09 DIAGNOSIS — I152 Hypertension secondary to endocrine disorders: Secondary | ICD-10-CM

## 2020-05-09 DIAGNOSIS — E1169 Type 2 diabetes mellitus with other specified complication: Secondary | ICD-10-CM | POA: Diagnosis not present

## 2020-05-09 LAB — POCT GLYCOSYLATED HEMOGLOBIN (HGB A1C): Hemoglobin A1C: 6.1 % — AB (ref 4.0–5.6)

## 2020-05-09 MED ORDER — GABAPENTIN 300 MG PO CAPS: 600.0000 mg | ORAL_CAPSULE | Freq: Three times a day (TID) | ORAL | 1 refills | Status: DC

## 2020-05-09 NOTE — Progress Notes (Signed)
Subjective  CC:  Chief Complaint  Patient presents with  . Diabetes  . Hypertension  . Leg Pain    left leg    HPI: Krista ASCHER is a 79 y.o. female who presents to the office today for follow up of diabetes and problems listed above in the chief complaint.   Diabetes follow up: Her diabetic control is reported as Unchanged.  She continues on Metformin 1000 twice daily.  No adverse effects. She denies exertional CP or SOB or symptomatic hypoglycemia. She denies foot sores but foot paresthesias persist albeit slightly improved on gabapentin 300 3 times daily.  She denies somnolence or adverse effects from medicines.  Flu shot due today.  Hypertension: Has been well controlled however elevated during pain response.  Spinal stenosis, chronic pain on chronic narcotics Norco 3 times daily.  Daughter reports that she is in a lot of pain and is not very mobile because of it.  Wonder if there are any other options.  They report they saw orthopedics who gave epidural spinal injections that only improved her symptoms for about 24 hours.  They offered surgical treatment but patient declines.  I will request records.  Lower extremity edema is improving using as needed Lasix and elevation of legs.  Wt Readings from Last 3 Encounters:  05/09/20 199 lb (90.3 kg)  04/04/20 199 lb (90.3 kg)  02/06/20 196 lb 6.4 oz (89.1 kg)    BP Readings from Last 3 Encounters:  05/09/20 (!) 156/98  04/04/20 (!) 151/75  02/06/20 132/68    Assessment  1. Diabetes mellitus type 2 in obese ([place])   2. Hypertension associated with diabetes ([place])   3. Spinal stenosis of lumbar region, unspecified whether neurogenic claudication present   4. Chronic narcotic dependence ([place])   5. Severe obesity (BMI 35.0-35.9 with comorbidity) ([place])   6. Need for immunization against influenza   7. Paresthesia of both lower extremities      Plan   Diabetes is currently very well controlled.  Continue Metformin twice daily.   Flu shot updated today.  Hypertension has been well controlled.  Will work on controlling her pain and then recheck.  Spinal stenosis and chronic pain on chronic narcotics.  High risk situation.  She has failed all other conservative measures.  We will try to get home health to her home to evaluate her for mobility and strengthening.  Increase gabapentin to 600 3 times daily, hopefully she will be able to tolerate this.  Daughter is asking for a 4 times daily dosing of her Norco.  Will reevaluate at next visit.  Want to review neurosurgeon notes first.   Follow up: No follow-ups on file.. Orders Placed This Encounter  Procedures  . Flu Vaccine QUAD High Dose(Fluad)  . POCT HgB A1C   Meds ordered this encounter  Medications  . gabapentin (NEURONTIN) 300 MG capsule    Sig: Take 2 capsules (600 mg total) by mouth 3 (three) times daily.    Dispense:  540 capsule    Refill:  1    Increasing dose to 600 tid. Please adjust for patient. Thanks!      Immunization History  Administered Date(s) Administered  . Fluad Quad(high Dose 65+) 06/19/2019, 05/09/2020  . Influenza, High Dose Seasonal PF 06/30/2017, 05/12/2018  . PFIZER SARS-COV-2 Vaccination 10/06/2019, 10/29/2019  . Pneumococcal Conjugate-13 05/12/2018  . Pneumococcal Polysaccharide-23 07/13/2019  . Tdap 06/15/2018  . Zoster Recombinat (Shingrix) 07/30/2019    Diabetes Related Lab Review: Lab  Results  Component Value Date   HGBA1C 6.1 (A) 05/09/2020   HGBA1C 6.2 (A) 02/06/2020   HGBA1C 6.2 (A) 10/31/2019    No results found for: Derl Barrow Lab Results  Component Value Date   CREATININE 0.99 04/04/2020   BUN 23 04/04/2020   NA 138 04/04/2020   K 5.1 04/04/2020   CL 100 04/04/2020   CO2 27 04/04/2020   Lab Results  Component Value Date   CHOL 142 02/06/2020   CHOL 133 03/28/2019   CHOL 136 09/26/2018   Lab Results  Component Value Date   HDL 51.80 02/06/2020   HDL 50.80 03/28/2019   HDL 53.10  09/26/2018   Lab Results  Component Value Date   LDLCALC 61 02/06/2020   LDLCALC 58 03/28/2019   LDLCALC 61 09/26/2018   Lab Results  Component Value Date   TRIG 143.0 02/06/2020   TRIG 120.0 03/28/2019   TRIG 110.0 09/26/2018   Lab Results  Component Value Date   CHOLHDL 3 02/06/2020   CHOLHDL 3 03/28/2019   CHOLHDL 3 09/26/2018   No results found for: LDLDIRECT The ASCVD Risk score Mikey Bussing DC Jr., et al., 2013) failed to calculate for the following reasons:   The 2013 ASCVD risk score is only valid for ages 36 to 79 I have reviewed the [place], Fam and Soc history. Patient Active Problem List   Diagnosis Date Noted  . Lumbar stenosis 10/31/2019    Priority: High    Dr. Marlou Sa 2010 lumbar MRI, multilevel and severe. Failed cymbalta, PT, lyrica and gabapentin. [facility] with epidural spinal injections   . Chronic narcotic dependence ([place]) 07/13/2019    Priority: High  . PAD (peripheral artery disease) ([place]) 11/18/2016    Priority: High    MILD: LE [place] on 04/29/16, mild arterial occlusive disease. Mild right iliac arterial disease and mild left distal popliteal disease. Right ABI 1.06, Left ABI 1.07.   Marland Kitchen Chronic midline low back pain with bilateral sciatica 10/02/2016    Priority: High  . Severe obesity (BMI 35.0-35.9 with comorbidity) ([place]) 10/02/2016    Priority: High  . Hyperlipidemia associated with type 2 diabetes mellitus ([place]) 10/02/2016    Priority: High  . Hypertension associated with diabetes ([place]) 03/08/2016    Priority: High    Medications reviewed and adjusted today.    . Diabetes mellitus type 2 in obese ([place]) 03/08/2016    Priority: High  . History of complete heart block 08/20/2015    Priority: High    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, CHB 08/20/2015    Priority: High    Scheduled Remote pacemaker check  02/12/2020: There were 2  atrial high rate episodes detected. The longest lasted 6 seconds in duration. Review of EGMs demonstrates paroxysmal atrial tachycardia. There was a <1 % cumulative atrial arrhythmia burden. There were 0 high ventricular rate episodes detected. Health trends do not demonstrate significant abnormality. Battery longevity is 10.5 years. RA pacing is 3.4 %, RV pacing is 99.0 %.   . Urinary incontinence in female, followed by GYN, did not tolerate pessary 06/27/2018    Priority: Medium  . LBBB (left bundle branch block) 11/18/2016    Priority: Medium    EKG 07/08/16: Sinus rhythm , rate 80, left axis deviation, left anterior fascicular block, left bundle branch block.   . 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  [place]) 10/03/2016    Priority: Medium  . Paresthesia of both lower extremities 10/02/2016    Priority: Medium    Due to lumbar stenosis.neurontin   . Microcytic anemia 03/08/2016    Priority: Medium  . Vitamin B12 deficiency 02/06/2020    Priority: Low  . Encounter for care of pacemaker 05/24/2019    Social History: Patient  reports that she has never smoked. She has never used smokeless tobacco. She reports that she does not drink alcohol and does not use drugs.  Review of Systems: Ophthalmic: negative for eye pain, loss of vision or double vision Cardiovascular: negative for chest pain Respiratory: negative for SOB or persistent cough Gastrointestinal: negative for abdominal pain Genitourinary: negative for dysuria or gross hematuria MSK: negative for foot lesions Neurologic: negative for weakness or gait disturbance  Objective  Vitals: BP (!) 156/98 Comment: Elevated due to pain response  Pulse 100   Temp 98.4 F (36.9 C) (Temporal)   Ht 5\' 4"  (1.626 m)   Wt 199 lb (90.3 kg)   LMP  (LMP Unknown)   SpO2 97%   BMI 34.16 kg/m  General: well appearing, no acute distress  Psych:  Alert and oriented, normal mood and affect HEENT:  Normocephalic, atraumatic, moist mucous membranes,  supple neck  Cardiovascular:  Nl S1 and S2, RRR Respiratory:  Good breath sounds bilaterally, CTAB with normal effort, no rales Skin:  Warm, no rashes Foot exam: no erythema, pallor, or cyanosis visible  +2 distal pulses bilaterally Diabetic Foot Exam - Simple   Simple Foot Form Diabetic Foot exam was performed with the following findings: Yes 05/09/2020  4:00 PM  Visual Inspection No deformities, no ulcerations, no other skin breakdown bilaterally: Yes Sensation Testing See comments: Yes Pulse Check Posterior Tibialis and Dorsalis pulse intact bilaterally: Yes Comments Decreased sensation bilaterally; proprioception nl on left, abnl on right      Diabetic education: ongoing education regarding chronic disease management for diabetes was given today. We continue to reinforce the ABC's of diabetic management: A1c (<7 or 8 dependent upon patient), tight blood pressure control, and cholesterol management with goal LDL < 100 minimally. We discuss diet strategies, exercise recommendations, medication options and possible side effects. At each visit, we review recommended immunizations and preventive care recommendations for diabetics and stress that good diabetic control can prevent other problems. See below for this patient's data.    Commons side effects, risks, benefits, and alternatives for medications and treatment plan prescribed today were discussed, and the patient expressed understanding of the given instructions. Patient is instructed to call or message via MyChart if he/she has any questions or concerns regarding our treatment plan. No barriers to understanding were identified. We discussed Red Flag symptoms and signs in detail. Patient expressed understanding regarding what to do in case of urgent or emergency type symptoms.   Medication list was reconciled, printed and provided to the patient in AVS. Patient instructions and summary information was reviewed with the patient as  documented in the AVS. This note was prepared with assistance of Dragon voice recognition software. Occasional wrong-word or sound-a-like substitutions may have occurred due to the inherent limitations of voice recognition software  This visit occurred during the SARS-CoV-2 public health emergency.  Safety protocols were in place, including screening questions prior to the visit, additional usage of staff PPE, and extensive cleaning of exam room while observing appropriate contact time as indicated for disinfecting solutions.

## 2020-05-09 NOTE — Patient Instructions (Signed)
Please return in 3 months for recheck.   Please increase your gabapentin to 2 tabs 3x/day; this should help your back and foot pain.  Let me know if home health doesn't contact you.   If you have any questions or concerns, please don't hesitate to send me a message via MyChart or call the office at [phone]. Thank you for visiting with [place] today! It's our pleasure caring for you.

## 2020-05-12 ENCOUNTER — Other Ambulatory Visit: Payer: Self-pay

## 2020-05-12 DIAGNOSIS — R202 Paresthesia of skin: Secondary | ICD-10-CM

## 2020-05-12 DIAGNOSIS — M5442 Lumbago with sciatica, left side: Secondary | ICD-10-CM

## 2020-05-12 DIAGNOSIS — M48061 Spinal stenosis, lumbar region without neurogenic claudication: Secondary | ICD-10-CM

## 2020-05-13 DIAGNOSIS — Z45018 Encounter for adjustment and management of other part of cardiac pacemaker: Secondary | ICD-10-CM | POA: Diagnosis not present

## 2020-05-13 DIAGNOSIS — Z950 Presence of cardiac pacemaker: Secondary | ICD-10-CM | POA: Diagnosis not present

## 2020-05-13 DIAGNOSIS — I442 Atrioventricular block, complete: Secondary | ICD-10-CM | POA: Diagnosis not present

## 2020-05-15 DIAGNOSIS — I10 Essential (primary) hypertension: Secondary | ICD-10-CM | POA: Diagnosis not present

## 2020-05-15 DIAGNOSIS — E1169 Type 2 diabetes mellitus with other specified complication: Secondary | ICD-10-CM | POA: Diagnosis not present

## 2020-05-15 DIAGNOSIS — E785 Hyperlipidemia, unspecified: Secondary | ICD-10-CM | POA: Diagnosis not present

## 2020-05-15 DIAGNOSIS — E1159 Type 2 diabetes mellitus with other circulatory complications: Secondary | ICD-10-CM | POA: Diagnosis not present

## 2020-05-21 ENCOUNTER — Ambulatory Visit: Payer: Medicare Other | Admitting: Podiatry

## 2020-05-21 ENCOUNTER — Encounter: Payer: Self-pay | Admitting: Podiatry

## 2020-05-21 ENCOUNTER — Other Ambulatory Visit: Payer: Self-pay

## 2020-05-21 DIAGNOSIS — I739 Peripheral vascular disease, unspecified: Secondary | ICD-10-CM | POA: Diagnosis not present

## 2020-05-21 DIAGNOSIS — D689 Coagulation defect, unspecified: Secondary | ICD-10-CM

## 2020-05-21 DIAGNOSIS — M79675 Pain in left toe(s): Secondary | ICD-10-CM

## 2020-05-21 DIAGNOSIS — B351 Tinea unguium: Secondary | ICD-10-CM

## 2020-05-21 DIAGNOSIS — E1142 Type 2 diabetes mellitus with diabetic polyneuropathy: Secondary | ICD-10-CM | POA: Diagnosis not present

## 2020-05-21 DIAGNOSIS — M79674 Pain in right toe(s): Secondary | ICD-10-CM

## 2020-05-21 NOTE — Progress Notes (Signed)
This patient returns to my office for at risk foot care.  This patient requires this care by a professional since this patient will be at risk due to having  Diabetes and PAD.  Patient is taking plavix.  This patient is unable to cut nails herself since the patient cannot reach her nails .  . nails.These nails are painful walking and wearing shoes.  This patient presents for at risk foot care today.  General Appearance  Alert, conversant and in no acute stress.  Vascular  Dorsalis pedis and posterior tibial  pulses are palpable  bilaterally.  Capillary return is within normal limits  bilaterally. Temperature is within normal limits  bilaterally.  Neurologic  Senn-Weinstein monofilament wire absent   bilaterally. Muscle power within normal limits bilaterally.  Nails Thick disfigured discolored nails with subungual debris  from hallux to fifth toes bilaterally. No evidence of bacterial infection or drainage bilaterally.  Orthopedic  No limitations of motion  feet .  No crepitus or effusions noted.  No bony pathology or digital deformities noted.  Skin  normotropic skin with no porokeratosis noted bilaterally.  No signs of infections or ulcers noted.     Onychomycosis  Pain in right toes  Pain in left toes  Consent was obtained for treatment procedures.   Mechanical debridement of nails 1-5  bilaterally performed with a nail nipper.  Filed with dremel without incident. No infection or ulcer.     Return office visit  3 months        Told patient to return for periodic foot care and evaluation due to potential at risk complications.   Gardiner Barefoot DPM

## 2020-05-23 ENCOUNTER — Other Ambulatory Visit: Payer: Self-pay

## 2020-05-23 DIAGNOSIS — R202 Paresthesia of skin: Secondary | ICD-10-CM

## 2020-05-23 DIAGNOSIS — M48061 Spinal stenosis, lumbar region without neurogenic claudication: Secondary | ICD-10-CM

## 2020-05-23 DIAGNOSIS — M5442 Lumbago with sciatica, left side: Secondary | ICD-10-CM

## 2020-05-28 ENCOUNTER — Telehealth: Payer: Self-pay

## 2020-05-28 NOTE — Telephone Encounter (Signed)
I initially ordered for nursing and then redid referral for PT

## 2020-05-28 NOTE — Telephone Encounter (Signed)
Pt called stating she has not heard from any home health services in regards to scheduling

## 2020-06-03 DIAGNOSIS — E1151 Type 2 diabetes mellitus with diabetic peripheral angiopathy without gangrene: Secondary | ICD-10-CM | POA: Diagnosis not present

## 2020-06-03 DIAGNOSIS — Z86718 Personal history of other venous thrombosis and embolism: Secondary | ICD-10-CM | POA: Diagnosis not present

## 2020-06-03 DIAGNOSIS — L4050 Arthropathic psoriasis, unspecified: Secondary | ICD-10-CM | POA: Diagnosis not present

## 2020-06-03 DIAGNOSIS — I10 Essential (primary) hypertension: Secondary | ICD-10-CM | POA: Diagnosis not present

## 2020-06-03 DIAGNOSIS — M5441 Lumbago with sciatica, right side: Secondary | ICD-10-CM | POA: Diagnosis not present

## 2020-06-03 DIAGNOSIS — M48061 Spinal stenosis, lumbar region without neurogenic claudication: Secondary | ICD-10-CM | POA: Diagnosis not present

## 2020-06-03 DIAGNOSIS — E1169 Type 2 diabetes mellitus with other specified complication: Secondary | ICD-10-CM | POA: Diagnosis not present

## 2020-06-03 DIAGNOSIS — M5442 Lumbago with sciatica, left side: Secondary | ICD-10-CM | POA: Diagnosis not present

## 2020-06-03 DIAGNOSIS — Z7984 Long term (current) use of oral hypoglycemic drugs: Secondary | ICD-10-CM | POA: Diagnosis not present

## 2020-06-03 DIAGNOSIS — G8929 Other chronic pain: Secondary | ICD-10-CM | POA: Diagnosis not present

## 2020-06-03 DIAGNOSIS — Z7902 Long term (current) use of antithrombotics/antiplatelets: Secondary | ICD-10-CM | POA: Diagnosis not present

## 2020-06-03 DIAGNOSIS — Z7982 Long term (current) use of aspirin: Secondary | ICD-10-CM | POA: Diagnosis not present

## 2020-06-03 DIAGNOSIS — Z9181 History of falling: Secondary | ICD-10-CM | POA: Diagnosis not present

## 2020-06-03 DIAGNOSIS — Z79891 Long term (current) use of opiate analgesic: Secondary | ICD-10-CM | POA: Diagnosis not present

## 2020-06-03 DIAGNOSIS — E785 Hyperlipidemia, unspecified: Secondary | ICD-10-CM | POA: Diagnosis not present

## 2020-06-03 DIAGNOSIS — I447 Left bundle-branch block, unspecified: Secondary | ICD-10-CM | POA: Diagnosis not present

## 2020-06-03 DIAGNOSIS — D509 Iron deficiency anemia, unspecified: Secondary | ICD-10-CM | POA: Diagnosis not present

## 2020-06-05 ENCOUNTER — Telehealth: Payer: Self-pay

## 2020-06-05 NOTE — Telephone Encounter (Signed)
Pt called stating she is wanting to get her covid booster shot but she has lost her vaccine card. Pt asked if we could print off proof that she has had her first 2 shots (with the dates) and mail it to her. Please advise.

## 2020-06-05 NOTE — Telephone Encounter (Signed)
I spoke to pt to make her aware of Vaccinations printed and sent to home address. I was also able to give her the number to [facility] to assist her with getting a new card.

## 2020-06-06 NOTE — Telephone Encounter (Signed)
Patient is set up and [facility]-this has been resolved

## 2020-06-12 ENCOUNTER — Telehealth: Payer: Self-pay

## 2020-06-12 DIAGNOSIS — Z7982 Long term (current) use of aspirin: Secondary | ICD-10-CM | POA: Diagnosis not present

## 2020-06-12 DIAGNOSIS — Z86718 Personal history of other venous thrombosis and embolism: Secondary | ICD-10-CM | POA: Diagnosis not present

## 2020-06-12 DIAGNOSIS — E785 Hyperlipidemia, unspecified: Secondary | ICD-10-CM | POA: Diagnosis not present

## 2020-06-12 DIAGNOSIS — M5442 Lumbago with sciatica, left side: Secondary | ICD-10-CM | POA: Diagnosis not present

## 2020-06-12 DIAGNOSIS — Z7984 Long term (current) use of oral hypoglycemic drugs: Secondary | ICD-10-CM | POA: Diagnosis not present

## 2020-06-12 DIAGNOSIS — E1169 Type 2 diabetes mellitus with other specified complication: Secondary | ICD-10-CM | POA: Diagnosis not present

## 2020-06-12 DIAGNOSIS — M48061 Spinal stenosis, lumbar region without neurogenic claudication: Secondary | ICD-10-CM | POA: Diagnosis not present

## 2020-06-12 DIAGNOSIS — G8929 Other chronic pain: Secondary | ICD-10-CM | POA: Diagnosis not present

## 2020-06-12 DIAGNOSIS — E1151 Type 2 diabetes mellitus with diabetic peripheral angiopathy without gangrene: Secondary | ICD-10-CM | POA: Diagnosis not present

## 2020-06-12 DIAGNOSIS — D509 Iron deficiency anemia, unspecified: Secondary | ICD-10-CM | POA: Diagnosis not present

## 2020-06-12 DIAGNOSIS — L4050 Arthropathic psoriasis, unspecified: Secondary | ICD-10-CM | POA: Diagnosis not present

## 2020-06-12 DIAGNOSIS — Z79891 Long term (current) use of opiate analgesic: Secondary | ICD-10-CM | POA: Diagnosis not present

## 2020-06-12 DIAGNOSIS — I10 Essential (primary) hypertension: Secondary | ICD-10-CM | POA: Diagnosis not present

## 2020-06-12 DIAGNOSIS — Z9181 History of falling: Secondary | ICD-10-CM | POA: Diagnosis not present

## 2020-06-12 DIAGNOSIS — I447 Left bundle-branch block, unspecified: Secondary | ICD-10-CM | POA: Diagnosis not present

## 2020-06-12 DIAGNOSIS — Z7902 Long term (current) use of antithrombotics/antiplatelets: Secondary | ICD-10-CM | POA: Diagnosis not present

## 2020-06-12 DIAGNOSIS — M5441 Lumbago with sciatica, right side: Secondary | ICD-10-CM | POA: Diagnosis not present

## 2020-06-12 NOTE — Telephone Encounter (Signed)
Returned [place] call and VO provided.

## 2020-06-12 NOTE — Telephone Encounter (Signed)
Home Health verbal orders-caller/Agency: [facility] number: [phone]  Requesting: PT orders  Frequency: once weekly for 9 weeks

## 2020-06-13 ENCOUNTER — Telehealth: Payer: Self-pay

## 2020-06-13 DIAGNOSIS — E1169 Type 2 diabetes mellitus with other specified complication: Secondary | ICD-10-CM | POA: Diagnosis not present

## 2020-06-13 DIAGNOSIS — Z86718 Personal history of other venous thrombosis and embolism: Secondary | ICD-10-CM | POA: Diagnosis not present

## 2020-06-13 DIAGNOSIS — M48061 Spinal stenosis, lumbar region without neurogenic claudication: Secondary | ICD-10-CM | POA: Diagnosis not present

## 2020-06-13 DIAGNOSIS — Z7984 Long term (current) use of oral hypoglycemic drugs: Secondary | ICD-10-CM | POA: Diagnosis not present

## 2020-06-13 DIAGNOSIS — Z9181 History of falling: Secondary | ICD-10-CM | POA: Diagnosis not present

## 2020-06-13 DIAGNOSIS — G8929 Other chronic pain: Secondary | ICD-10-CM | POA: Diagnosis not present

## 2020-06-13 DIAGNOSIS — I10 Essential (primary) hypertension: Secondary | ICD-10-CM | POA: Diagnosis not present

## 2020-06-13 DIAGNOSIS — I447 Left bundle-branch block, unspecified: Secondary | ICD-10-CM | POA: Diagnosis not present

## 2020-06-13 DIAGNOSIS — Z79891 Long term (current) use of opiate analgesic: Secondary | ICD-10-CM | POA: Diagnosis not present

## 2020-06-13 DIAGNOSIS — E1151 Type 2 diabetes mellitus with diabetic peripheral angiopathy without gangrene: Secondary | ICD-10-CM | POA: Diagnosis not present

## 2020-06-13 DIAGNOSIS — Z7982 Long term (current) use of aspirin: Secondary | ICD-10-CM | POA: Diagnosis not present

## 2020-06-13 DIAGNOSIS — E785 Hyperlipidemia, unspecified: Secondary | ICD-10-CM | POA: Diagnosis not present

## 2020-06-13 DIAGNOSIS — D509 Iron deficiency anemia, unspecified: Secondary | ICD-10-CM | POA: Diagnosis not present

## 2020-06-13 DIAGNOSIS — Z7902 Long term (current) use of antithrombotics/antiplatelets: Secondary | ICD-10-CM | POA: Diagnosis not present

## 2020-06-13 DIAGNOSIS — M5441 Lumbago with sciatica, right side: Secondary | ICD-10-CM | POA: Diagnosis not present

## 2020-06-13 DIAGNOSIS — L4050 Arthropathic psoriasis, unspecified: Secondary | ICD-10-CM | POA: Diagnosis not present

## 2020-06-13 DIAGNOSIS — M5442 Lumbago with sciatica, left side: Secondary | ICD-10-CM | POA: Diagnosis not present

## 2020-06-13 NOTE — Telephone Encounter (Signed)
Called and lm providing VO.

## 2020-06-13 NOTE — Telephone Encounter (Signed)
Home Health Occupational Therapist  Requesting-  1 time a week for 7 weeks    [phone] . Mallori.   Ok to leave voicemail

## 2020-06-17 DIAGNOSIS — G8929 Other chronic pain: Secondary | ICD-10-CM | POA: Diagnosis not present

## 2020-06-17 DIAGNOSIS — L4050 Arthropathic psoriasis, unspecified: Secondary | ICD-10-CM | POA: Diagnosis not present

## 2020-06-17 DIAGNOSIS — I447 Left bundle-branch block, unspecified: Secondary | ICD-10-CM | POA: Diagnosis not present

## 2020-06-17 DIAGNOSIS — M5441 Lumbago with sciatica, right side: Secondary | ICD-10-CM | POA: Diagnosis not present

## 2020-06-17 DIAGNOSIS — Z7982 Long term (current) use of aspirin: Secondary | ICD-10-CM | POA: Diagnosis not present

## 2020-06-17 DIAGNOSIS — M5442 Lumbago with sciatica, left side: Secondary | ICD-10-CM | POA: Diagnosis not present

## 2020-06-17 DIAGNOSIS — M48061 Spinal stenosis, lumbar region without neurogenic claudication: Secondary | ICD-10-CM | POA: Diagnosis not present

## 2020-06-17 DIAGNOSIS — E785 Hyperlipidemia, unspecified: Secondary | ICD-10-CM | POA: Diagnosis not present

## 2020-06-17 DIAGNOSIS — Z79891 Long term (current) use of opiate analgesic: Secondary | ICD-10-CM | POA: Diagnosis not present

## 2020-06-17 DIAGNOSIS — Z7984 Long term (current) use of oral hypoglycemic drugs: Secondary | ICD-10-CM | POA: Diagnosis not present

## 2020-06-17 DIAGNOSIS — E1151 Type 2 diabetes mellitus with diabetic peripheral angiopathy without gangrene: Secondary | ICD-10-CM | POA: Diagnosis not present

## 2020-06-17 DIAGNOSIS — Z9181 History of falling: Secondary | ICD-10-CM | POA: Diagnosis not present

## 2020-06-17 DIAGNOSIS — I10 Essential (primary) hypertension: Secondary | ICD-10-CM | POA: Diagnosis not present

## 2020-06-17 DIAGNOSIS — D509 Iron deficiency anemia, unspecified: Secondary | ICD-10-CM | POA: Diagnosis not present

## 2020-06-17 DIAGNOSIS — E1169 Type 2 diabetes mellitus with other specified complication: Secondary | ICD-10-CM | POA: Diagnosis not present

## 2020-06-17 DIAGNOSIS — Z86718 Personal history of other venous thrombosis and embolism: Secondary | ICD-10-CM | POA: Diagnosis not present

## 2020-06-17 DIAGNOSIS — Z7902 Long term (current) use of antithrombotics/antiplatelets: Secondary | ICD-10-CM | POA: Diagnosis not present

## 2020-06-18 DIAGNOSIS — Z79891 Long term (current) use of opiate analgesic: Secondary | ICD-10-CM | POA: Diagnosis not present

## 2020-06-18 DIAGNOSIS — M48061 Spinal stenosis, lumbar region without neurogenic claudication: Secondary | ICD-10-CM | POA: Diagnosis not present

## 2020-06-18 DIAGNOSIS — G8929 Other chronic pain: Secondary | ICD-10-CM | POA: Diagnosis not present

## 2020-06-18 DIAGNOSIS — I447 Left bundle-branch block, unspecified: Secondary | ICD-10-CM | POA: Diagnosis not present

## 2020-06-18 DIAGNOSIS — Z7902 Long term (current) use of antithrombotics/antiplatelets: Secondary | ICD-10-CM | POA: Diagnosis not present

## 2020-06-18 DIAGNOSIS — E1151 Type 2 diabetes mellitus with diabetic peripheral angiopathy without gangrene: Secondary | ICD-10-CM | POA: Diagnosis not present

## 2020-06-18 DIAGNOSIS — E1169 Type 2 diabetes mellitus with other specified complication: Secondary | ICD-10-CM | POA: Diagnosis not present

## 2020-06-18 DIAGNOSIS — Z7982 Long term (current) use of aspirin: Secondary | ICD-10-CM | POA: Diagnosis not present

## 2020-06-18 DIAGNOSIS — I10 Essential (primary) hypertension: Secondary | ICD-10-CM | POA: Diagnosis not present

## 2020-06-18 DIAGNOSIS — E785 Hyperlipidemia, unspecified: Secondary | ICD-10-CM | POA: Diagnosis not present

## 2020-06-18 DIAGNOSIS — Z86718 Personal history of other venous thrombosis and embolism: Secondary | ICD-10-CM | POA: Diagnosis not present

## 2020-06-18 DIAGNOSIS — D509 Iron deficiency anemia, unspecified: Secondary | ICD-10-CM | POA: Diagnosis not present

## 2020-06-18 DIAGNOSIS — Z7984 Long term (current) use of oral hypoglycemic drugs: Secondary | ICD-10-CM | POA: Diagnosis not present

## 2020-06-18 DIAGNOSIS — Z9181 History of falling: Secondary | ICD-10-CM | POA: Diagnosis not present

## 2020-06-18 DIAGNOSIS — M5441 Lumbago with sciatica, right side: Secondary | ICD-10-CM | POA: Diagnosis not present

## 2020-06-18 DIAGNOSIS — L4050 Arthropathic psoriasis, unspecified: Secondary | ICD-10-CM | POA: Diagnosis not present

## 2020-06-18 DIAGNOSIS — M5442 Lumbago with sciatica, left side: Secondary | ICD-10-CM | POA: Diagnosis not present

## 2020-06-21 ENCOUNTER — Other Ambulatory Visit: Payer: Self-pay | Admitting: Family Medicine

## 2020-06-21 DIAGNOSIS — E1159 Type 2 diabetes mellitus with other circulatory complications: Secondary | ICD-10-CM

## 2020-06-21 DIAGNOSIS — R600 Localized edema: Secondary | ICD-10-CM

## 2020-06-23 ENCOUNTER — Other Ambulatory Visit: Payer: Self-pay | Admitting: Cardiology

## 2020-06-23 DIAGNOSIS — D509 Iron deficiency anemia, unspecified: Secondary | ICD-10-CM | POA: Diagnosis not present

## 2020-06-23 DIAGNOSIS — E1169 Type 2 diabetes mellitus with other specified complication: Secondary | ICD-10-CM | POA: Diagnosis not present

## 2020-06-23 DIAGNOSIS — G8929 Other chronic pain: Secondary | ICD-10-CM | POA: Diagnosis not present

## 2020-06-23 DIAGNOSIS — I447 Left bundle-branch block, unspecified: Secondary | ICD-10-CM | POA: Diagnosis not present

## 2020-06-23 DIAGNOSIS — M48061 Spinal stenosis, lumbar region without neurogenic claudication: Secondary | ICD-10-CM | POA: Diagnosis not present

## 2020-06-23 DIAGNOSIS — Z7982 Long term (current) use of aspirin: Secondary | ICD-10-CM | POA: Diagnosis not present

## 2020-06-23 DIAGNOSIS — M5441 Lumbago with sciatica, right side: Secondary | ICD-10-CM | POA: Diagnosis not present

## 2020-06-23 DIAGNOSIS — K219 Gastro-esophageal reflux disease without esophagitis: Secondary | ICD-10-CM

## 2020-06-23 DIAGNOSIS — E785 Hyperlipidemia, unspecified: Secondary | ICD-10-CM | POA: Diagnosis not present

## 2020-06-23 DIAGNOSIS — Z7984 Long term (current) use of oral hypoglycemic drugs: Secondary | ICD-10-CM | POA: Diagnosis not present

## 2020-06-23 DIAGNOSIS — Z86718 Personal history of other venous thrombosis and embolism: Secondary | ICD-10-CM | POA: Diagnosis not present

## 2020-06-23 DIAGNOSIS — Z9181 History of falling: Secondary | ICD-10-CM | POA: Diagnosis not present

## 2020-06-23 DIAGNOSIS — M5442 Lumbago with sciatica, left side: Secondary | ICD-10-CM | POA: Diagnosis not present

## 2020-06-23 DIAGNOSIS — I10 Essential (primary) hypertension: Secondary | ICD-10-CM | POA: Diagnosis not present

## 2020-06-23 DIAGNOSIS — E1151 Type 2 diabetes mellitus with diabetic peripheral angiopathy without gangrene: Secondary | ICD-10-CM | POA: Diagnosis not present

## 2020-06-23 DIAGNOSIS — Z7902 Long term (current) use of antithrombotics/antiplatelets: Secondary | ICD-10-CM | POA: Diagnosis not present

## 2020-06-23 DIAGNOSIS — L4050 Arthropathic psoriasis, unspecified: Secondary | ICD-10-CM | POA: Diagnosis not present

## 2020-06-23 DIAGNOSIS — Z79891 Long term (current) use of opiate analgesic: Secondary | ICD-10-CM | POA: Diagnosis not present

## 2020-06-25 DIAGNOSIS — M5442 Lumbago with sciatica, left side: Secondary | ICD-10-CM | POA: Diagnosis not present

## 2020-06-25 DIAGNOSIS — M5441 Lumbago with sciatica, right side: Secondary | ICD-10-CM | POA: Diagnosis not present

## 2020-06-25 DIAGNOSIS — I10 Essential (primary) hypertension: Secondary | ICD-10-CM | POA: Diagnosis not present

## 2020-06-25 DIAGNOSIS — Z7902 Long term (current) use of antithrombotics/antiplatelets: Secondary | ICD-10-CM | POA: Diagnosis not present

## 2020-06-25 DIAGNOSIS — L4050 Arthropathic psoriasis, unspecified: Secondary | ICD-10-CM | POA: Diagnosis not present

## 2020-06-25 DIAGNOSIS — Z7984 Long term (current) use of oral hypoglycemic drugs: Secondary | ICD-10-CM | POA: Diagnosis not present

## 2020-06-25 DIAGNOSIS — Z79891 Long term (current) use of opiate analgesic: Secondary | ICD-10-CM | POA: Diagnosis not present

## 2020-06-25 DIAGNOSIS — Z86718 Personal history of other venous thrombosis and embolism: Secondary | ICD-10-CM | POA: Diagnosis not present

## 2020-06-25 DIAGNOSIS — Z7982 Long term (current) use of aspirin: Secondary | ICD-10-CM | POA: Diagnosis not present

## 2020-06-25 DIAGNOSIS — M48061 Spinal stenosis, lumbar region without neurogenic claudication: Secondary | ICD-10-CM | POA: Diagnosis not present

## 2020-06-25 DIAGNOSIS — Z9181 History of falling: Secondary | ICD-10-CM | POA: Diagnosis not present

## 2020-06-25 DIAGNOSIS — I447 Left bundle-branch block, unspecified: Secondary | ICD-10-CM | POA: Diagnosis not present

## 2020-06-25 DIAGNOSIS — E1151 Type 2 diabetes mellitus with diabetic peripheral angiopathy without gangrene: Secondary | ICD-10-CM | POA: Diagnosis not present

## 2020-06-25 DIAGNOSIS — G8929 Other chronic pain: Secondary | ICD-10-CM | POA: Diagnosis not present

## 2020-06-25 DIAGNOSIS — E1169 Type 2 diabetes mellitus with other specified complication: Secondary | ICD-10-CM | POA: Diagnosis not present

## 2020-06-25 DIAGNOSIS — D509 Iron deficiency anemia, unspecified: Secondary | ICD-10-CM | POA: Diagnosis not present

## 2020-06-25 DIAGNOSIS — E785 Hyperlipidemia, unspecified: Secondary | ICD-10-CM | POA: Diagnosis not present

## 2020-07-01 DIAGNOSIS — M48061 Spinal stenosis, lumbar region without neurogenic claudication: Secondary | ICD-10-CM | POA: Diagnosis not present

## 2020-07-01 DIAGNOSIS — Z7984 Long term (current) use of oral hypoglycemic drugs: Secondary | ICD-10-CM | POA: Diagnosis not present

## 2020-07-01 DIAGNOSIS — Z79891 Long term (current) use of opiate analgesic: Secondary | ICD-10-CM | POA: Diagnosis not present

## 2020-07-01 DIAGNOSIS — L4050 Arthropathic psoriasis, unspecified: Secondary | ICD-10-CM | POA: Diagnosis not present

## 2020-07-01 DIAGNOSIS — G8929 Other chronic pain: Secondary | ICD-10-CM | POA: Diagnosis not present

## 2020-07-01 DIAGNOSIS — D509 Iron deficiency anemia, unspecified: Secondary | ICD-10-CM | POA: Diagnosis not present

## 2020-07-01 DIAGNOSIS — E785 Hyperlipidemia, unspecified: Secondary | ICD-10-CM | POA: Diagnosis not present

## 2020-07-01 DIAGNOSIS — Z9181 History of falling: Secondary | ICD-10-CM | POA: Diagnosis not present

## 2020-07-01 DIAGNOSIS — M5442 Lumbago with sciatica, left side: Secondary | ICD-10-CM | POA: Diagnosis not present

## 2020-07-01 DIAGNOSIS — E1169 Type 2 diabetes mellitus with other specified complication: Secondary | ICD-10-CM | POA: Diagnosis not present

## 2020-07-01 DIAGNOSIS — I447 Left bundle-branch block, unspecified: Secondary | ICD-10-CM | POA: Diagnosis not present

## 2020-07-01 DIAGNOSIS — M5441 Lumbago with sciatica, right side: Secondary | ICD-10-CM | POA: Diagnosis not present

## 2020-07-01 DIAGNOSIS — E1151 Type 2 diabetes mellitus with diabetic peripheral angiopathy without gangrene: Secondary | ICD-10-CM | POA: Diagnosis not present

## 2020-07-01 DIAGNOSIS — Z7902 Long term (current) use of antithrombotics/antiplatelets: Secondary | ICD-10-CM | POA: Diagnosis not present

## 2020-07-01 DIAGNOSIS — I10 Essential (primary) hypertension: Secondary | ICD-10-CM | POA: Diagnosis not present

## 2020-07-01 DIAGNOSIS — Z86718 Personal history of other venous thrombosis and embolism: Secondary | ICD-10-CM | POA: Diagnosis not present

## 2020-07-01 DIAGNOSIS — Z7982 Long term (current) use of aspirin: Secondary | ICD-10-CM | POA: Diagnosis not present

## 2020-07-02 DIAGNOSIS — Z7984 Long term (current) use of oral hypoglycemic drugs: Secondary | ICD-10-CM | POA: Diagnosis not present

## 2020-07-02 DIAGNOSIS — I447 Left bundle-branch block, unspecified: Secondary | ICD-10-CM | POA: Diagnosis not present

## 2020-07-02 DIAGNOSIS — D509 Iron deficiency anemia, unspecified: Secondary | ICD-10-CM | POA: Diagnosis not present

## 2020-07-02 DIAGNOSIS — L4050 Arthropathic psoriasis, unspecified: Secondary | ICD-10-CM | POA: Diagnosis not present

## 2020-07-02 DIAGNOSIS — Z7982 Long term (current) use of aspirin: Secondary | ICD-10-CM | POA: Diagnosis not present

## 2020-07-02 DIAGNOSIS — E1151 Type 2 diabetes mellitus with diabetic peripheral angiopathy without gangrene: Secondary | ICD-10-CM | POA: Diagnosis not present

## 2020-07-02 DIAGNOSIS — M5441 Lumbago with sciatica, right side: Secondary | ICD-10-CM | POA: Diagnosis not present

## 2020-07-02 DIAGNOSIS — G8929 Other chronic pain: Secondary | ICD-10-CM | POA: Diagnosis not present

## 2020-07-02 DIAGNOSIS — Z7902 Long term (current) use of antithrombotics/antiplatelets: Secondary | ICD-10-CM | POA: Diagnosis not present

## 2020-07-02 DIAGNOSIS — E1169 Type 2 diabetes mellitus with other specified complication: Secondary | ICD-10-CM | POA: Diagnosis not present

## 2020-07-02 DIAGNOSIS — M5442 Lumbago with sciatica, left side: Secondary | ICD-10-CM | POA: Diagnosis not present

## 2020-07-02 DIAGNOSIS — Z9181 History of falling: Secondary | ICD-10-CM | POA: Diagnosis not present

## 2020-07-02 DIAGNOSIS — Z79891 Long term (current) use of opiate analgesic: Secondary | ICD-10-CM | POA: Diagnosis not present

## 2020-07-02 DIAGNOSIS — Z86718 Personal history of other venous thrombosis and embolism: Secondary | ICD-10-CM | POA: Diagnosis not present

## 2020-07-02 DIAGNOSIS — I10 Essential (primary) hypertension: Secondary | ICD-10-CM | POA: Diagnosis not present

## 2020-07-02 DIAGNOSIS — E785 Hyperlipidemia, unspecified: Secondary | ICD-10-CM | POA: Diagnosis not present

## 2020-07-02 DIAGNOSIS — M48061 Spinal stenosis, lumbar region without neurogenic claudication: Secondary | ICD-10-CM | POA: Diagnosis not present

## 2020-07-08 ENCOUNTER — Telehealth: Payer: Self-pay

## 2020-07-08 DIAGNOSIS — Z86718 Personal history of other venous thrombosis and embolism: Secondary | ICD-10-CM | POA: Diagnosis not present

## 2020-07-08 DIAGNOSIS — I10 Essential (primary) hypertension: Secondary | ICD-10-CM | POA: Diagnosis not present

## 2020-07-08 DIAGNOSIS — Z9181 History of falling: Secondary | ICD-10-CM | POA: Diagnosis not present

## 2020-07-08 DIAGNOSIS — M5441 Lumbago with sciatica, right side: Secondary | ICD-10-CM | POA: Diagnosis not present

## 2020-07-08 DIAGNOSIS — E1151 Type 2 diabetes mellitus with diabetic peripheral angiopathy without gangrene: Secondary | ICD-10-CM | POA: Diagnosis not present

## 2020-07-08 DIAGNOSIS — M5442 Lumbago with sciatica, left side: Secondary | ICD-10-CM | POA: Diagnosis not present

## 2020-07-08 DIAGNOSIS — Z7982 Long term (current) use of aspirin: Secondary | ICD-10-CM | POA: Diagnosis not present

## 2020-07-08 DIAGNOSIS — Z7902 Long term (current) use of antithrombotics/antiplatelets: Secondary | ICD-10-CM | POA: Diagnosis not present

## 2020-07-08 DIAGNOSIS — E1169 Type 2 diabetes mellitus with other specified complication: Secondary | ICD-10-CM | POA: Diagnosis not present

## 2020-07-08 DIAGNOSIS — Z79891 Long term (current) use of opiate analgesic: Secondary | ICD-10-CM | POA: Diagnosis not present

## 2020-07-08 DIAGNOSIS — L4050 Arthropathic psoriasis, unspecified: Secondary | ICD-10-CM | POA: Diagnosis not present

## 2020-07-08 DIAGNOSIS — E785 Hyperlipidemia, unspecified: Secondary | ICD-10-CM | POA: Diagnosis not present

## 2020-07-08 DIAGNOSIS — G8929 Other chronic pain: Secondary | ICD-10-CM | POA: Diagnosis not present

## 2020-07-08 DIAGNOSIS — D509 Iron deficiency anemia, unspecified: Secondary | ICD-10-CM | POA: Diagnosis not present

## 2020-07-08 DIAGNOSIS — Z7984 Long term (current) use of oral hypoglycemic drugs: Secondary | ICD-10-CM | POA: Diagnosis not present

## 2020-07-08 DIAGNOSIS — M48061 Spinal stenosis, lumbar region without neurogenic claudication: Secondary | ICD-10-CM | POA: Diagnosis not present

## 2020-07-08 DIAGNOSIS — I447 Left bundle-branch block, unspecified: Secondary | ICD-10-CM | POA: Diagnosis not present

## 2020-07-08 NOTE — Telephone Encounter (Deleted)
Home Health verbal orders-caller/Agency: [facility] number: [phone]  Requesting: PT  Frequency: Twice a week for four weeks - once a week for four weeks

## 2020-07-08 NOTE — Telephone Encounter (Signed)
Home Health Certification or Plan of Care Tracking  Is this a Certification or Plan of Care: BOTH  HH Agency: [facility] Number:  [phone]   Where has form been placed:  Folder up front

## 2020-07-09 ENCOUNTER — Other Ambulatory Visit: Payer: Self-pay | Admitting: Family Medicine

## 2020-07-09 NOTE — Telephone Encounter (Signed)
Papers placed in Dr. Tamela Oddi folder

## 2020-07-09 NOTE — Telephone Encounter (Signed)
Last refill: 04/30/20 #90, 0 Last OV: 05/09/20 dx. DM

## 2020-07-11 ENCOUNTER — Other Ambulatory Visit: Payer: Self-pay

## 2020-07-11 ENCOUNTER — Encounter: Payer: Self-pay | Admitting: Cardiology

## 2020-07-11 ENCOUNTER — Ambulatory Visit: Payer: Medicare Other | Admitting: Cardiology

## 2020-07-11 VITALS — BP 135/71 | HR 93 | Ht 64.0 in | Wt 198.0 lb

## 2020-07-11 DIAGNOSIS — K219 Gastro-esophageal reflux disease without esophagitis: Secondary | ICD-10-CM

## 2020-07-11 DIAGNOSIS — I10 Essential (primary) hypertension: Secondary | ICD-10-CM | POA: Diagnosis not present

## 2020-07-11 DIAGNOSIS — I442 Atrioventricular block, complete: Secondary | ICD-10-CM

## 2020-07-11 DIAGNOSIS — M48062 Spinal stenosis, lumbar region with neurogenic claudication: Secondary | ICD-10-CM | POA: Diagnosis not present

## 2020-07-11 DIAGNOSIS — Z950 Presence of cardiac pacemaker: Secondary | ICD-10-CM | POA: Diagnosis not present

## 2020-07-11 NOTE — Patient Instructions (Addendum)
Omeprazole 20 mg take 1 extra dose for heart burn as needed

## 2020-07-11 NOTE — Progress Notes (Signed)
Primary Physician/Referring:  Leamon Arnt, MD  Patient ID: Krista Clarke, female    DOB: 1941-07-02, 79 y.o.   MRN: [id number]  Chief Complaint  Patient presents with  . sick sinus syndrome  . Follow-up   HPI:    Krista Clarke  is a 79 y.o. AAF female  with  second-degree AV block and complete heart and S/P St. 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  She also has chronic back pain and sciatica and was recommended surgery but family did not want her to have surgery due to her underlying medical comorbidity and age.  She is accompanied by her daughter today.  She denies any worsening dyspnea, worsening leg edema, she has occasional heartburn and takes Tums with relief but occasionally can be worse.  She continues to have back pain and also severe pain in her legs all the way from the hip down left worse than the right.  Past Medical History:  Diagnosis Date  . Anemia    takes iron daily  . Arthritis   . Complete heart block ([place]) 08/20/2015   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    . Diabetes mellitus without complication ([place])   . Encounter for care of pacemaker 05/24/2019  . Hypercholesteremia   . Hypertension   . 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 EGMs show AT/VP. There were 0 high ventricular rate episodes detected. Health trends do not demonstrate significant abnormality. Battery longevity is 10.4 - 11.2 years. RA pacing is 1.1 %, RV pacing is >99 %.  Scheduled  In office pacemaker check 07/12/19  Single (S)/Dual (D)/BV: D. Presenting ASVP @ 70/min.   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 Marlou Sa 2010 lumbar MRI, multilevel and severe.   Past  Surgical History:  Procedure Laterality Date  . ABDOMINAL HYSTERECTOMY    . COLONOSCOPY WITH PROPOFOL N/A 07/03/2013   Procedure: COLONOSCOPY WITH PROPOFOL;  Surgeon: Garlan Fair, MD;  Location: [facility];  Service: Endoscopy;  Laterality: N/A;  . EP IMPLANTABLE DEVICE N/A 08/20/2015   Procedure: Pacemaker Implant;  Surgeon: Evans Lance, MD;  Location: [facility];  Service: Cardiovascular;  Laterality: N/A;  . JOINT REPLACEMENT Bilateral    '99. Knees  . LEFT HEART CATHETERIZATION WITH CORONARY ANGIOGRAM N/A 08/27/2014   Procedure: LEFT HEART CATHETERIZATION WITH CORONARY ANGIOGRAM;  Surgeon: Laverda Page, MD;  Location: [facility] CATH LAB;  Service: Cardiovascular;  Laterality: N/A;  . TUBAL LIGATION      Social History   Tobacco Use  . Smoking status: Never Smoker  . Smokeless tobacco: Never Used  Substance Use Topics  . Alcohol use: No  Marital Status: Widowed   ROS  Review of Systems  Cardiovascular: Positive for leg swelling (occasional). Negative for dyspnea on exertion and syncope.  Musculoskeletal: Positive for back pain, joint pain and muscle weakness. Negative for joint swelling and muscle cramps. Arthritis: chronic.  Gastrointestinal: Positive for heartburn. Negative for melena.  Neurological: Positive for disturbances in coordination (uses walker). Negative for headaches and light-headedness.  All other systems reviewed and are negative.  Objective   Vitals with BMI 07/11/2020 05/09/2020 04/04/2020  Height 5\' 4"  5'  4" -  Weight 198 lbs 199 lbs -  BMI 41.74 08.14 -  Systolic 481 856 314  Diastolic 71 98 75  Pulse 93 100 73    Orthostatic VS for the past 72 hrs (Last 3 readings):  Patient Position BP Location Cuff Size  07/11/20 1311 Sitting Left Arm Large     Physical Exam Constitutional:      General: She is not in acute distress.    Comments: Mildly obese  Neck:     Comments: Short neck and difficult to evaluate JVP Cardiovascular:      Rate and Rhythm: Normal rate and regular rhythm.     Pulses:          Carotid pulses are 2+ on the right side and 2+ on the left side.      Dorsalis pedis pulses are 1+ on the right side and 1+ on the left side.       Posterior tibial pulses are 0 on the right side and 0 on the left side.     Heart sounds: Normal heart sounds. No murmur heard.  No gallop.      Comments: Femoral and popliteal pulse difficult to feel due to patient's body habitus. Capillary refill normal. No skin breakdown. Skin is warm in the legs. No leg edema. No JVD.  Pulmonary:     Effort: Pulmonary effort is normal.     Breath sounds: Normal breath sounds.  Abdominal:     General: Bowel sounds are normal.     Palpations: Abdomen is soft.     Comments: Obese. Pannus present  Musculoskeletal:        General: Normal range of motion.    Laboratory examination:   Recent Labs    01/01/20 1535 01/21/20 0206 04/04/20 0314  NA 137 141 138  K 4.7 4.5 5.1  CL 103 105 100  CO2 21 23 27   GLUCOSE 164* 215* 155*  BUN 23 18 23   CREATININE 1.06 1.15* 0.99  CALCIUM 9.6 9.6 9.6  GFRNONAA  --  45* 54*  GFRAA  --  52* >60   CrCl cannot be calculated (Patient's most recent lab result is older than the maximum 21 days allowed.).  CMP Latest Ref Rng & Units 04/04/2020 01/21/2020 01/01/2020  Glucose 70 - 99 mg/dL 155(H) 215(H) 164(H)  BUN 8 - 23 mg/dL 23 18 23   Creatinine 0.44 - 1.00 mg/dL 0.99 1.15(H) 1.06  Sodium 135 - 145 mmol/L 138 141 137  Potassium 3.5 - 5.1 mmol/L 5.1 4.5 4.7  Chloride 98 - 111 mmol/L 100 105 103  CO2 22 - 32 mmol/L 27 23 21   Calcium 8.9 - 10.3 mg/dL 9.6 9.6 9.6  Total Protein 6.0 - 8.3 g/dL - - 7.4  Total Bilirubin 0.2 - 1.2 mg/dL - - 0.3  Alkaline Phos 39 - 117 U/L - - 77  AST 0 - 37 U/L - - 13  ALT 0 - 35 U/L - - 14   CBC Latest Ref Rng & Units 04/04/2020 02/06/2020 01/21/2020  WBC 4.0 - 10.5 K/uL 7.4 5.6 6.1  Hemoglobin 12.0 - 15.0 g/dL 9.9(L) 10.0(L) 9.8(L)  Hematocrit 36 - 46 % 33.6(L) 31.2(L)  33.1(L)  Platelets 150 - 400 K/uL 308 307.0 296   Lipid Panel Recent Labs    02/06/20 1513  CHOL 142  TRIG 143.0  LDLCALC 61  VLDL 28.6  HDL 51.80  CHOLHDL 3   HEMOGLOBIN A1C Lab Results  Component Value Date   HGBA1C  6.1 (A) 05/09/2020   TSH Recent Labs    01/01/20 1535  TSH 4.09   Medications and allergies   Allergies  Allergen Reactions  . Prednisone Palpitations and Other (See Comments)    Doesn't work well for patient    Current Outpatient Medications on File Prior to Visit  Medication Sig Dispense Refill  . allopurinol (ZYLOPRIM) 100 MG tablet TAKE 1 TABLET BY MOUTH EVERY MORNING (Patient taking differently: Take 100 mg by mouth daily. ) 90 tablet 3  . amLODipine (NORVASC) 10 MG tablet TAKE 1 TABLET BY MOUTH EVERY DAY 90 tablet 1  . ascorbic acid (VITAMIN C) 500 MG tablet TAKE 1 TABLET BY MOUTH EVERY DAY 90 tablet 3  . ASPIRIN LOW DOSE 81 MG chewable tablet CHEW ONE TABLET BY MOUTH EVERY DAY 90 tablet 3  . atorvastatin (LIPITOR) 40 MG tablet Take 1 tablet (40 mg total) by mouth daily. 90 tablet 3  . diclofenac Sodium (VOLTAREN) 1 % GEL Apply 4 g topically 4 (four) times daily as needed (pain). 400 g 3  . FEROSUL 325 (65 Fe) MG tablet TAKE 1 TABLET BY MOUTH 2 TIMES DAILY 90 tablet 3  . furosemide (LASIX) 20 MG tablet TAKE 1 TABLET BY MOUTH EVERY DAY 90 tablet 1  . gabapentin (NEURONTIN) 300 MG capsule Take 2 capsules (600 mg total) by mouth 3 (three) times daily. 540 capsule 1  . HYDROcodone-acetaminophen (NORCO/VICODIN) 5-325 MG tablet TAKE 1 TABLET BY MOUTH 3 TIMES DAILY AS NEEDED FOR MODERATE PAIN 90 tablet 0  . losartan (COZAAR) 50 MG tablet TAKE 1 TABLET BY MOUTH EVERY DAY 90 tablet 3  . Lutein 20 MG CAPS TAKE 1 CAPSULE BY MOUTH EVERY DAY 90 capsule 3  . Magnesium Oxide 400 (240 Mg) MG TABS Take 1 tablet by mouth daily.    . meclizine (ANTIVERT) 12.5 MG tablet Take 1 tablet (12.5 mg total) by mouth 2 (two) times daily as needed for dizziness. 30 tablet 0  .  metFORMIN (GLUCOPHAGE) 1000 MG tablet TAKE 1 TABLET BY MOUTH 2 TIMES DAILY WITH A MEAL 180 tablet 3  . metoprolol tartrate (LOPRESSOR) 25 MG tablet TAKE 1 TABLET BY MOUTH 2 TIMES DAILY IN THE MORNING AND AT BEDTIME 480 tablet 0  . omeprazole (PRILOSEC) 20 MG capsule TAKE 1 CAPSULE BY MOUTH BEFORE BREAKFAST 30 capsule 3  . potassium chloride SA (KLOR-CON) 20 MEQ tablet TAKE 1 TABLET BY MOUTH EVERY DAY WITH FUROSEMIDE. 90 tablet 3   No current facility-administered medications on file prior to visit.    Radiology:   DG Chest 2 View Result Date: 01/21/2020 CLINICAL DATA:  Chest pain EXAM: CHEST - 2 VIEW COMPARISON:  1. Low lung volumes with streaky basilar atelectasis. No other acute cardiopulmonary abnormality.  2. Chronic elevation of the right hemidiaphragm. Electronically Signed   By: Lovena Le M.D.   On: 01/21/2020 03:36   Cardiac Studies:   Nuclear stress test 07/16/2013:  1. Resting EKG NSR, LBBB. Stress EKG was non diagnostic for ischemia. No ADDITIONAL ST-T changes of ischemia noted with pharmacologic stress testing. Stress symptoms included lightheadedness, headache and chest pressure. Stress terminated due to completion of protocol. 2. The perfusion study demonstrated a very prominent breast attenuation artifact in the inferior, inferoseptal region, large size. Left ventricular ejection fraction was estimated to be 78%. This appearance is low risk study. Please see above for explanation.  Coronary angiogram 08/27/2014:  Normal coronary arteries. Severe tortuosity, suggests hypertensive heart disease. Normal LVEF.  Echocardiogram  12/11/2014:  1. Left ventricle cavity is normal in size. Mild concentric hypertrophy of the left ventricle. Normal global wall motion. Doppler evidence of grade II (pseudonormal) diastolic dysfunction. Calculated EF 63%. 2. Trace mitral regurgitation. Mild calcification of the mitral valve annulus. 3. Trace tricuspid regurgitation. IVC is normal with  blunted respiratory response.  Lower Extremity Arterial Duplex 04/29/2016:  There is mild arterial occlusive disease. The above study suggests mild right iliac arterial disease and mild left distal popliteal or tibial disease. Right ABI 1.06, left ABI of 1.07  Lower Extremity Arterial Duplex 11/20/2016: Mildly decreased bilateral lower extremity ABIs, with the left ABI compatible with mild PAD and the right ABI compatible with borderline PAD, progressed in the interval. Further evaluation could be performed with CTA runoff as clinically indicated.  Device clinic: [facility] [address] - dual chamber PPM - 08/20/2015.   Scheduled Remote pacemaker check  05/13/2020: There were 0 atrial high rate episodes detected. There were 0 high ventricular rate episodes detected. Health trends do not demonstrate significant abnormality. Battery longevity is 10.4 years. RA pacing is <1.0 %, RV pacing is >99.0 %.   EKG:  EKG 07/11/2020: Normal sinus rhythm with borderline first-degree AV block at the rate of 87 bpm, left atrial enlargement, ventricularly paced rhythm.    EKG 01/30/2020: AV paced rhythm with rate of 84 bpm,  No further analysis.  Assessment     ICD-10-CM   1. Complete heart block (HCC)  I44.2 EKG 12-Lead  2. 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, CHB  Z95.0   3. Essential hypertension  I10   4. Pseudoclaudication  M48.062   5. Gastroesophageal reflux disease without esophagitis  K21.9   No orders of the defined types were placed in this encounter.  Medications Discontinued During This Encounter  Medication Reason  . magnesium oxide (MAG-OX) 093 MG tablet Duplicate  . clopidogrel (PLAVIX) 75 MG tablet Completed Course    Recommendations:    Krista Clarke  is a 79 y.o. AAF female  with  second-degree AV block and complete heart and S/P St. Jude permanent pacemaker implantation on 12/19/2014, hypertension, hyperlipidemia, uncontrolled type  II diabetes with stage 2 chronic kidney disease, hiatal hernia and obesity.  She also has chronic back pain and sciatica and was recommended surgery but family did not want her to have surgery due to her underlying medical comorbidity and age.  Vascular examination is mildly abnormal with regard to lower extremity, does not explain the severity of her pain in her leg that is continuous and suspect pseudoclaudication.  No indication for Plavix, will discontinue this.  She is also having heartburn occasionally, advised her to take omeprazole extra dose if necessary.  Otherwise blood pressure is well controlled, pacemaker is functioning normally, I will see her back on annual basis.   Adrian Prows, MD, [facility] 07/11/2020, 1:44 PM Office: [phone] Pager: [phone]

## 2020-07-16 DIAGNOSIS — Z7982 Long term (current) use of aspirin: Secondary | ICD-10-CM | POA: Diagnosis not present

## 2020-07-16 DIAGNOSIS — M5441 Lumbago with sciatica, right side: Secondary | ICD-10-CM | POA: Diagnosis not present

## 2020-07-16 DIAGNOSIS — Z86718 Personal history of other venous thrombosis and embolism: Secondary | ICD-10-CM | POA: Diagnosis not present

## 2020-07-16 DIAGNOSIS — Z7902 Long term (current) use of antithrombotics/antiplatelets: Secondary | ICD-10-CM | POA: Diagnosis not present

## 2020-07-16 DIAGNOSIS — G8929 Other chronic pain: Secondary | ICD-10-CM | POA: Diagnosis not present

## 2020-07-16 DIAGNOSIS — L4050 Arthropathic psoriasis, unspecified: Secondary | ICD-10-CM | POA: Diagnosis not present

## 2020-07-16 DIAGNOSIS — M48061 Spinal stenosis, lumbar region without neurogenic claudication: Secondary | ICD-10-CM | POA: Diagnosis not present

## 2020-07-16 DIAGNOSIS — Z7984 Long term (current) use of oral hypoglycemic drugs: Secondary | ICD-10-CM | POA: Diagnosis not present

## 2020-07-16 DIAGNOSIS — E1151 Type 2 diabetes mellitus with diabetic peripheral angiopathy without gangrene: Secondary | ICD-10-CM | POA: Diagnosis not present

## 2020-07-16 DIAGNOSIS — D509 Iron deficiency anemia, unspecified: Secondary | ICD-10-CM | POA: Diagnosis not present

## 2020-07-16 DIAGNOSIS — E1169 Type 2 diabetes mellitus with other specified complication: Secondary | ICD-10-CM | POA: Diagnosis not present

## 2020-07-16 DIAGNOSIS — Z79891 Long term (current) use of opiate analgesic: Secondary | ICD-10-CM | POA: Diagnosis not present

## 2020-07-16 DIAGNOSIS — E785 Hyperlipidemia, unspecified: Secondary | ICD-10-CM | POA: Diagnosis not present

## 2020-07-16 DIAGNOSIS — I447 Left bundle-branch block, unspecified: Secondary | ICD-10-CM | POA: Diagnosis not present

## 2020-07-16 DIAGNOSIS — Z9181 History of falling: Secondary | ICD-10-CM | POA: Diagnosis not present

## 2020-07-16 DIAGNOSIS — I10 Essential (primary) hypertension: Secondary | ICD-10-CM | POA: Diagnosis not present

## 2020-07-16 DIAGNOSIS — M5442 Lumbago with sciatica, left side: Secondary | ICD-10-CM | POA: Diagnosis not present

## 2020-07-18 DIAGNOSIS — D509 Iron deficiency anemia, unspecified: Secondary | ICD-10-CM | POA: Diagnosis not present

## 2020-07-18 DIAGNOSIS — E1169 Type 2 diabetes mellitus with other specified complication: Secondary | ICD-10-CM | POA: Diagnosis not present

## 2020-07-18 DIAGNOSIS — Z7984 Long term (current) use of oral hypoglycemic drugs: Secondary | ICD-10-CM | POA: Diagnosis not present

## 2020-07-18 DIAGNOSIS — Z9181 History of falling: Secondary | ICD-10-CM | POA: Diagnosis not present

## 2020-07-18 DIAGNOSIS — L4050 Arthropathic psoriasis, unspecified: Secondary | ICD-10-CM | POA: Diagnosis not present

## 2020-07-18 DIAGNOSIS — G8929 Other chronic pain: Secondary | ICD-10-CM | POA: Diagnosis not present

## 2020-07-18 DIAGNOSIS — M5441 Lumbago with sciatica, right side: Secondary | ICD-10-CM | POA: Diagnosis not present

## 2020-07-18 DIAGNOSIS — I447 Left bundle-branch block, unspecified: Secondary | ICD-10-CM | POA: Diagnosis not present

## 2020-07-18 DIAGNOSIS — E785 Hyperlipidemia, unspecified: Secondary | ICD-10-CM | POA: Diagnosis not present

## 2020-07-18 DIAGNOSIS — M48061 Spinal stenosis, lumbar region without neurogenic claudication: Secondary | ICD-10-CM | POA: Diagnosis not present

## 2020-07-18 DIAGNOSIS — Z86718 Personal history of other venous thrombosis and embolism: Secondary | ICD-10-CM | POA: Diagnosis not present

## 2020-07-18 DIAGNOSIS — Z79891 Long term (current) use of opiate analgesic: Secondary | ICD-10-CM | POA: Diagnosis not present

## 2020-07-18 DIAGNOSIS — I10 Essential (primary) hypertension: Secondary | ICD-10-CM | POA: Diagnosis not present

## 2020-07-18 DIAGNOSIS — E1151 Type 2 diabetes mellitus with diabetic peripheral angiopathy without gangrene: Secondary | ICD-10-CM | POA: Diagnosis not present

## 2020-07-18 DIAGNOSIS — Z7982 Long term (current) use of aspirin: Secondary | ICD-10-CM | POA: Diagnosis not present

## 2020-07-18 DIAGNOSIS — Z7902 Long term (current) use of antithrombotics/antiplatelets: Secondary | ICD-10-CM | POA: Diagnosis not present

## 2020-07-18 DIAGNOSIS — M5442 Lumbago with sciatica, left side: Secondary | ICD-10-CM | POA: Diagnosis not present

## 2020-07-23 DIAGNOSIS — G8929 Other chronic pain: Secondary | ICD-10-CM | POA: Diagnosis not present

## 2020-07-23 DIAGNOSIS — Z7902 Long term (current) use of antithrombotics/antiplatelets: Secondary | ICD-10-CM | POA: Diagnosis not present

## 2020-07-23 DIAGNOSIS — Z86718 Personal history of other venous thrombosis and embolism: Secondary | ICD-10-CM | POA: Diagnosis not present

## 2020-07-23 DIAGNOSIS — M48061 Spinal stenosis, lumbar region without neurogenic claudication: Secondary | ICD-10-CM | POA: Diagnosis not present

## 2020-07-23 DIAGNOSIS — I10 Essential (primary) hypertension: Secondary | ICD-10-CM | POA: Diagnosis not present

## 2020-07-23 DIAGNOSIS — E785 Hyperlipidemia, unspecified: Secondary | ICD-10-CM | POA: Diagnosis not present

## 2020-07-23 DIAGNOSIS — E1169 Type 2 diabetes mellitus with other specified complication: Secondary | ICD-10-CM | POA: Diagnosis not present

## 2020-07-23 DIAGNOSIS — L4050 Arthropathic psoriasis, unspecified: Secondary | ICD-10-CM | POA: Diagnosis not present

## 2020-07-23 DIAGNOSIS — Z7984 Long term (current) use of oral hypoglycemic drugs: Secondary | ICD-10-CM | POA: Diagnosis not present

## 2020-07-23 DIAGNOSIS — I447 Left bundle-branch block, unspecified: Secondary | ICD-10-CM | POA: Diagnosis not present

## 2020-07-23 DIAGNOSIS — M5442 Lumbago with sciatica, left side: Secondary | ICD-10-CM | POA: Diagnosis not present

## 2020-07-23 DIAGNOSIS — Z9181 History of falling: Secondary | ICD-10-CM | POA: Diagnosis not present

## 2020-07-23 DIAGNOSIS — D509 Iron deficiency anemia, unspecified: Secondary | ICD-10-CM | POA: Diagnosis not present

## 2020-07-23 DIAGNOSIS — Z7982 Long term (current) use of aspirin: Secondary | ICD-10-CM | POA: Diagnosis not present

## 2020-07-23 DIAGNOSIS — E1151 Type 2 diabetes mellitus with diabetic peripheral angiopathy without gangrene: Secondary | ICD-10-CM | POA: Diagnosis not present

## 2020-07-23 DIAGNOSIS — M5441 Lumbago with sciatica, right side: Secondary | ICD-10-CM | POA: Diagnosis not present

## 2020-07-23 DIAGNOSIS — Z79891 Long term (current) use of opiate analgesic: Secondary | ICD-10-CM | POA: Diagnosis not present

## 2020-07-25 DIAGNOSIS — M5442 Lumbago with sciatica, left side: Secondary | ICD-10-CM | POA: Diagnosis not present

## 2020-07-25 DIAGNOSIS — L4050 Arthropathic psoriasis, unspecified: Secondary | ICD-10-CM | POA: Diagnosis not present

## 2020-07-25 DIAGNOSIS — Z9181 History of falling: Secondary | ICD-10-CM | POA: Diagnosis not present

## 2020-07-25 DIAGNOSIS — G8929 Other chronic pain: Secondary | ICD-10-CM | POA: Diagnosis not present

## 2020-07-25 DIAGNOSIS — M5441 Lumbago with sciatica, right side: Secondary | ICD-10-CM | POA: Diagnosis not present

## 2020-07-25 DIAGNOSIS — Z79891 Long term (current) use of opiate analgesic: Secondary | ICD-10-CM | POA: Diagnosis not present

## 2020-07-25 DIAGNOSIS — E1151 Type 2 diabetes mellitus with diabetic peripheral angiopathy without gangrene: Secondary | ICD-10-CM | POA: Diagnosis not present

## 2020-07-25 DIAGNOSIS — Z7982 Long term (current) use of aspirin: Secondary | ICD-10-CM | POA: Diagnosis not present

## 2020-07-25 DIAGNOSIS — Z7902 Long term (current) use of antithrombotics/antiplatelets: Secondary | ICD-10-CM | POA: Diagnosis not present

## 2020-07-25 DIAGNOSIS — E785 Hyperlipidemia, unspecified: Secondary | ICD-10-CM | POA: Diagnosis not present

## 2020-07-25 DIAGNOSIS — I10 Essential (primary) hypertension: Secondary | ICD-10-CM | POA: Diagnosis not present

## 2020-07-25 DIAGNOSIS — M48061 Spinal stenosis, lumbar region without neurogenic claudication: Secondary | ICD-10-CM | POA: Diagnosis not present

## 2020-07-25 DIAGNOSIS — Z7984 Long term (current) use of oral hypoglycemic drugs: Secondary | ICD-10-CM | POA: Diagnosis not present

## 2020-07-25 DIAGNOSIS — E1169 Type 2 diabetes mellitus with other specified complication: Secondary | ICD-10-CM | POA: Diagnosis not present

## 2020-07-25 DIAGNOSIS — Z86718 Personal history of other venous thrombosis and embolism: Secondary | ICD-10-CM | POA: Diagnosis not present

## 2020-07-25 DIAGNOSIS — I447 Left bundle-branch block, unspecified: Secondary | ICD-10-CM | POA: Diagnosis not present

## 2020-07-25 DIAGNOSIS — D509 Iron deficiency anemia, unspecified: Secondary | ICD-10-CM | POA: Diagnosis not present

## 2020-07-28 DIAGNOSIS — L4050 Arthropathic psoriasis, unspecified: Secondary | ICD-10-CM | POA: Diagnosis not present

## 2020-07-28 DIAGNOSIS — I10 Essential (primary) hypertension: Secondary | ICD-10-CM | POA: Diagnosis not present

## 2020-07-28 DIAGNOSIS — Z79891 Long term (current) use of opiate analgesic: Secondary | ICD-10-CM | POA: Diagnosis not present

## 2020-07-28 DIAGNOSIS — Z7902 Long term (current) use of antithrombotics/antiplatelets: Secondary | ICD-10-CM | POA: Diagnosis not present

## 2020-07-28 DIAGNOSIS — E1169 Type 2 diabetes mellitus with other specified complication: Secondary | ICD-10-CM | POA: Diagnosis not present

## 2020-07-28 DIAGNOSIS — Z9181 History of falling: Secondary | ICD-10-CM | POA: Diagnosis not present

## 2020-07-28 DIAGNOSIS — I447 Left bundle-branch block, unspecified: Secondary | ICD-10-CM | POA: Diagnosis not present

## 2020-07-28 DIAGNOSIS — Z7982 Long term (current) use of aspirin: Secondary | ICD-10-CM | POA: Diagnosis not present

## 2020-07-28 DIAGNOSIS — E785 Hyperlipidemia, unspecified: Secondary | ICD-10-CM | POA: Diagnosis not present

## 2020-07-28 DIAGNOSIS — M5442 Lumbago with sciatica, left side: Secondary | ICD-10-CM | POA: Diagnosis not present

## 2020-07-28 DIAGNOSIS — M48061 Spinal stenosis, lumbar region without neurogenic claudication: Secondary | ICD-10-CM | POA: Diagnosis not present

## 2020-07-28 DIAGNOSIS — D509 Iron deficiency anemia, unspecified: Secondary | ICD-10-CM | POA: Diagnosis not present

## 2020-07-28 DIAGNOSIS — G8929 Other chronic pain: Secondary | ICD-10-CM | POA: Diagnosis not present

## 2020-07-28 DIAGNOSIS — Z7984 Long term (current) use of oral hypoglycemic drugs: Secondary | ICD-10-CM | POA: Diagnosis not present

## 2020-07-28 DIAGNOSIS — M5441 Lumbago with sciatica, right side: Secondary | ICD-10-CM | POA: Diagnosis not present

## 2020-07-28 DIAGNOSIS — Z86718 Personal history of other venous thrombosis and embolism: Secondary | ICD-10-CM | POA: Diagnosis not present

## 2020-07-28 DIAGNOSIS — E1151 Type 2 diabetes mellitus with diabetic peripheral angiopathy without gangrene: Secondary | ICD-10-CM | POA: Diagnosis not present

## 2020-07-30 DIAGNOSIS — Z86718 Personal history of other venous thrombosis and embolism: Secondary | ICD-10-CM | POA: Diagnosis not present

## 2020-07-30 DIAGNOSIS — I10 Essential (primary) hypertension: Secondary | ICD-10-CM | POA: Diagnosis not present

## 2020-07-30 DIAGNOSIS — L4050 Arthropathic psoriasis, unspecified: Secondary | ICD-10-CM | POA: Diagnosis not present

## 2020-07-30 DIAGNOSIS — E1169 Type 2 diabetes mellitus with other specified complication: Secondary | ICD-10-CM | POA: Diagnosis not present

## 2020-07-30 DIAGNOSIS — Z79891 Long term (current) use of opiate analgesic: Secondary | ICD-10-CM | POA: Diagnosis not present

## 2020-07-30 DIAGNOSIS — M5441 Lumbago with sciatica, right side: Secondary | ICD-10-CM | POA: Diagnosis not present

## 2020-07-30 DIAGNOSIS — I447 Left bundle-branch block, unspecified: Secondary | ICD-10-CM | POA: Diagnosis not present

## 2020-07-30 DIAGNOSIS — M5442 Lumbago with sciatica, left side: Secondary | ICD-10-CM | POA: Diagnosis not present

## 2020-07-30 DIAGNOSIS — D509 Iron deficiency anemia, unspecified: Secondary | ICD-10-CM | POA: Diagnosis not present

## 2020-07-30 DIAGNOSIS — Z7982 Long term (current) use of aspirin: Secondary | ICD-10-CM | POA: Diagnosis not present

## 2020-07-30 DIAGNOSIS — Z9181 History of falling: Secondary | ICD-10-CM | POA: Diagnosis not present

## 2020-07-30 DIAGNOSIS — Z7984 Long term (current) use of oral hypoglycemic drugs: Secondary | ICD-10-CM | POA: Diagnosis not present

## 2020-07-30 DIAGNOSIS — Z7902 Long term (current) use of antithrombotics/antiplatelets: Secondary | ICD-10-CM | POA: Diagnosis not present

## 2020-07-30 DIAGNOSIS — G8929 Other chronic pain: Secondary | ICD-10-CM | POA: Diagnosis not present

## 2020-07-30 DIAGNOSIS — E1151 Type 2 diabetes mellitus with diabetic peripheral angiopathy without gangrene: Secondary | ICD-10-CM | POA: Diagnosis not present

## 2020-07-30 DIAGNOSIS — E785 Hyperlipidemia, unspecified: Secondary | ICD-10-CM | POA: Diagnosis not present

## 2020-07-30 DIAGNOSIS — M48061 Spinal stenosis, lumbar region without neurogenic claudication: Secondary | ICD-10-CM | POA: Diagnosis not present

## 2020-07-31 ENCOUNTER — Telehealth: Payer: Self-pay

## 2020-07-31 NOTE — Telephone Encounter (Signed)
..  Home Health Certification or Plan of Care Tracking  Is this a Certification or Plan of Care? both  [facility] Agency: [facility]    Order Number:  [id number]  Has charge sheet been attached? yes  Where has form been placed: providers folder

## 2020-07-31 NOTE — Telephone Encounter (Signed)
FYI, placed in your sign folder

## 2020-08-08 DIAGNOSIS — E1151 Type 2 diabetes mellitus with diabetic peripheral angiopathy without gangrene: Secondary | ICD-10-CM | POA: Diagnosis not present

## 2020-08-08 DIAGNOSIS — E1169 Type 2 diabetes mellitus with other specified complication: Secondary | ICD-10-CM | POA: Diagnosis not present

## 2020-08-08 DIAGNOSIS — Z86718 Personal history of other venous thrombosis and embolism: Secondary | ICD-10-CM | POA: Diagnosis not present

## 2020-08-08 DIAGNOSIS — G8929 Other chronic pain: Secondary | ICD-10-CM | POA: Diagnosis not present

## 2020-08-08 DIAGNOSIS — D509 Iron deficiency anemia, unspecified: Secondary | ICD-10-CM | POA: Diagnosis not present

## 2020-08-08 DIAGNOSIS — E785 Hyperlipidemia, unspecified: Secondary | ICD-10-CM | POA: Diagnosis not present

## 2020-08-08 DIAGNOSIS — Z9181 History of falling: Secondary | ICD-10-CM | POA: Diagnosis not present

## 2020-08-08 DIAGNOSIS — M5441 Lumbago with sciatica, right side: Secondary | ICD-10-CM | POA: Diagnosis not present

## 2020-08-08 DIAGNOSIS — Z7984 Long term (current) use of oral hypoglycemic drugs: Secondary | ICD-10-CM | POA: Diagnosis not present

## 2020-08-08 DIAGNOSIS — Z7902 Long term (current) use of antithrombotics/antiplatelets: Secondary | ICD-10-CM | POA: Diagnosis not present

## 2020-08-08 DIAGNOSIS — I10 Essential (primary) hypertension: Secondary | ICD-10-CM | POA: Diagnosis not present

## 2020-08-08 DIAGNOSIS — M5442 Lumbago with sciatica, left side: Secondary | ICD-10-CM | POA: Diagnosis not present

## 2020-08-08 DIAGNOSIS — M48061 Spinal stenosis, lumbar region without neurogenic claudication: Secondary | ICD-10-CM | POA: Diagnosis not present

## 2020-08-08 DIAGNOSIS — L4050 Arthropathic psoriasis, unspecified: Secondary | ICD-10-CM | POA: Diagnosis not present

## 2020-08-08 DIAGNOSIS — I447 Left bundle-branch block, unspecified: Secondary | ICD-10-CM | POA: Diagnosis not present

## 2020-08-12 DIAGNOSIS — E1169 Type 2 diabetes mellitus with other specified complication: Secondary | ICD-10-CM | POA: Diagnosis not present

## 2020-08-12 DIAGNOSIS — Z45018 Encounter for adjustment and management of other part of cardiac pacemaker: Secondary | ICD-10-CM | POA: Diagnosis not present

## 2020-08-12 DIAGNOSIS — I442 Atrioventricular block, complete: Secondary | ICD-10-CM | POA: Diagnosis not present

## 2020-08-12 DIAGNOSIS — E785 Hyperlipidemia, unspecified: Secondary | ICD-10-CM | POA: Diagnosis not present

## 2020-08-12 DIAGNOSIS — E1159 Type 2 diabetes mellitus with other circulatory complications: Secondary | ICD-10-CM | POA: Diagnosis not present

## 2020-08-12 DIAGNOSIS — I10 Essential (primary) hypertension: Secondary | ICD-10-CM | POA: Diagnosis not present

## 2020-08-12 DIAGNOSIS — Z950 Presence of cardiac pacemaker: Secondary | ICD-10-CM | POA: Diagnosis not present

## 2020-08-14 DIAGNOSIS — I10 Essential (primary) hypertension: Secondary | ICD-10-CM | POA: Diagnosis not present

## 2020-08-14 DIAGNOSIS — G8929 Other chronic pain: Secondary | ICD-10-CM | POA: Diagnosis not present

## 2020-08-14 DIAGNOSIS — E785 Hyperlipidemia, unspecified: Secondary | ICD-10-CM | POA: Diagnosis not present

## 2020-08-14 DIAGNOSIS — Z7984 Long term (current) use of oral hypoglycemic drugs: Secondary | ICD-10-CM | POA: Diagnosis not present

## 2020-08-14 DIAGNOSIS — M5442 Lumbago with sciatica, left side: Secondary | ICD-10-CM | POA: Diagnosis not present

## 2020-08-14 DIAGNOSIS — E1169 Type 2 diabetes mellitus with other specified complication: Secondary | ICD-10-CM | POA: Diagnosis not present

## 2020-08-14 DIAGNOSIS — Z86718 Personal history of other venous thrombosis and embolism: Secondary | ICD-10-CM | POA: Diagnosis not present

## 2020-08-14 DIAGNOSIS — M5441 Lumbago with sciatica, right side: Secondary | ICD-10-CM | POA: Diagnosis not present

## 2020-08-14 DIAGNOSIS — M48061 Spinal stenosis, lumbar region without neurogenic claudication: Secondary | ICD-10-CM | POA: Diagnosis not present

## 2020-08-14 DIAGNOSIS — E1151 Type 2 diabetes mellitus with diabetic peripheral angiopathy without gangrene: Secondary | ICD-10-CM | POA: Diagnosis not present

## 2020-08-14 DIAGNOSIS — E1159 Type 2 diabetes mellitus with other circulatory complications: Secondary | ICD-10-CM | POA: Diagnosis not present

## 2020-08-14 DIAGNOSIS — Z9181 History of falling: Secondary | ICD-10-CM | POA: Diagnosis not present

## 2020-08-14 DIAGNOSIS — I447 Left bundle-branch block, unspecified: Secondary | ICD-10-CM | POA: Diagnosis not present

## 2020-08-14 DIAGNOSIS — L4050 Arthropathic psoriasis, unspecified: Secondary | ICD-10-CM | POA: Diagnosis not present

## 2020-08-14 DIAGNOSIS — Z7902 Long term (current) use of antithrombotics/antiplatelets: Secondary | ICD-10-CM | POA: Diagnosis not present

## 2020-08-14 DIAGNOSIS — D509 Iron deficiency anemia, unspecified: Secondary | ICD-10-CM | POA: Diagnosis not present

## 2020-08-19 ENCOUNTER — Other Ambulatory Visit: Payer: Self-pay | Admitting: Family Medicine

## 2020-08-20 ENCOUNTER — Ambulatory Visit: Payer: Medicare Other | Admitting: Family Medicine

## 2020-08-21 DIAGNOSIS — M5442 Lumbago with sciatica, left side: Secondary | ICD-10-CM | POA: Diagnosis not present

## 2020-08-21 DIAGNOSIS — M5441 Lumbago with sciatica, right side: Secondary | ICD-10-CM | POA: Diagnosis not present

## 2020-08-21 DIAGNOSIS — Z7902 Long term (current) use of antithrombotics/antiplatelets: Secondary | ICD-10-CM | POA: Diagnosis not present

## 2020-08-21 DIAGNOSIS — G8929 Other chronic pain: Secondary | ICD-10-CM | POA: Diagnosis not present

## 2020-08-21 DIAGNOSIS — Z9181 History of falling: Secondary | ICD-10-CM | POA: Diagnosis not present

## 2020-08-21 DIAGNOSIS — E785 Hyperlipidemia, unspecified: Secondary | ICD-10-CM | POA: Diagnosis not present

## 2020-08-21 DIAGNOSIS — Z7984 Long term (current) use of oral hypoglycemic drugs: Secondary | ICD-10-CM | POA: Diagnosis not present

## 2020-08-21 DIAGNOSIS — D509 Iron deficiency anemia, unspecified: Secondary | ICD-10-CM | POA: Diagnosis not present

## 2020-08-21 DIAGNOSIS — Z86718 Personal history of other venous thrombosis and embolism: Secondary | ICD-10-CM | POA: Diagnosis not present

## 2020-08-21 DIAGNOSIS — I447 Left bundle-branch block, unspecified: Secondary | ICD-10-CM | POA: Diagnosis not present

## 2020-08-21 DIAGNOSIS — M48061 Spinal stenosis, lumbar region without neurogenic claudication: Secondary | ICD-10-CM | POA: Diagnosis not present

## 2020-08-21 DIAGNOSIS — I10 Essential (primary) hypertension: Secondary | ICD-10-CM | POA: Diagnosis not present

## 2020-08-21 DIAGNOSIS — E1151 Type 2 diabetes mellitus with diabetic peripheral angiopathy without gangrene: Secondary | ICD-10-CM | POA: Diagnosis not present

## 2020-08-21 DIAGNOSIS — L4050 Arthropathic psoriasis, unspecified: Secondary | ICD-10-CM | POA: Diagnosis not present

## 2020-08-21 DIAGNOSIS — E1169 Type 2 diabetes mellitus with other specified complication: Secondary | ICD-10-CM | POA: Diagnosis not present

## 2020-08-26 ENCOUNTER — Other Ambulatory Visit: Payer: Self-pay

## 2020-08-26 ENCOUNTER — Ambulatory Visit (INDEPENDENT_AMBULATORY_CARE_PROVIDER_SITE_OTHER): Payer: Medicare Other | Admitting: Family Medicine

## 2020-08-26 ENCOUNTER — Encounter: Payer: Self-pay | Admitting: Family Medicine

## 2020-08-26 VITALS — BP 132/62 | HR 83 | Temp 98.0°F | Resp 18 | Ht 64.0 in | Wt 195.6 lb

## 2020-08-26 DIAGNOSIS — E1140 Type 2 diabetes mellitus with diabetic neuropathy, unspecified: Secondary | ICD-10-CM | POA: Diagnosis not present

## 2020-08-26 DIAGNOSIS — I152 Hypertension secondary to endocrine disorders: Secondary | ICD-10-CM

## 2020-08-26 DIAGNOSIS — M25562 Pain in left knee: Secondary | ICD-10-CM

## 2020-08-26 DIAGNOSIS — Z950 Presence of cardiac pacemaker: Secondary | ICD-10-CM

## 2020-08-26 DIAGNOSIS — M5441 Lumbago with sciatica, right side: Secondary | ICD-10-CM | POA: Diagnosis not present

## 2020-08-26 DIAGNOSIS — E1169 Type 2 diabetes mellitus with other specified complication: Secondary | ICD-10-CM | POA: Diagnosis not present

## 2020-08-26 DIAGNOSIS — E785 Hyperlipidemia, unspecified: Secondary | ICD-10-CM

## 2020-08-26 DIAGNOSIS — M48062 Spinal stenosis, lumbar region with neurogenic claudication: Secondary | ICD-10-CM | POA: Diagnosis not present

## 2020-08-26 DIAGNOSIS — G8929 Other chronic pain: Secondary | ICD-10-CM

## 2020-08-26 DIAGNOSIS — E1159 Type 2 diabetes mellitus with other circulatory complications: Secondary | ICD-10-CM

## 2020-08-26 DIAGNOSIS — M25561 Pain in right knee: Secondary | ICD-10-CM

## 2020-08-26 DIAGNOSIS — M5442 Lumbago with sciatica, left side: Secondary | ICD-10-CM

## 2020-08-26 DIAGNOSIS — F1120 Opioid dependence, uncomplicated: Secondary | ICD-10-CM

## 2020-08-26 DIAGNOSIS — E538 Deficiency of other specified B group vitamins: Secondary | ICD-10-CM

## 2020-08-26 LAB — POCT GLYCOSYLATED HEMOGLOBIN (HGB A1C): Hemoglobin A1C: 5.8 % — AB (ref 4.0–5.6)

## 2020-08-26 MED ORDER — ATORVASTATIN CALCIUM 40 MG PO TABS: 40.0000 mg | ORAL_TABLET | Freq: Every day | ORAL | 3 refills | Status: DC

## 2020-08-26 MED ORDER — MECLIZINE HCL 12.5 MG PO TABS: 12.5000 mg | ORAL_TABLET | Freq: Two times a day (BID) | ORAL | 2 refills | Status: DC | PRN

## 2020-08-26 MED ORDER — DICLOFENAC SODIUM 1 % EX GEL: 4.0000 g | Freq: Four times a day (QID) | CUTANEOUS | 3 refills | Status: DC | PRN

## 2020-08-26 MED ORDER — HYDROCODONE-ACETAMINOPHEN 5-325 MG PO TABS: ORAL_TABLET | ORAL | 0 refills | Status: DC

## 2020-08-26 NOTE — Patient Instructions (Addendum)
Happy Krista Clarke!!  Please return in 3-4 months for your annual complete physical; please come fasting.  I have refilled your medications.  STOP the clopidogrel and start Vitamin B12 1000 mg daily.   If you have any questions or concerns, please don't hesitate to send me a message via MyChart or call the office at [phone]. Thank you for visiting with [place] today! It's our pleasure caring for you.

## 2020-08-26 NOTE — Progress Notes (Signed)
Subjective  CC: \   Chief Complaint  Patient presents with   Diabetes   Hypertension   Arthritis    HPI: Krista Clarke is a 79 y.o. female who presents to the office today for follow up of diabetes and problems listed above in the chief complaint.    Diabetes follow up: Her diabetic control is reported as Unchanged. She continues to do well on Metformin. No symptoms of high or low sugars. No adverse effects. She denies exertional CP or SOB or symptomatic hypoglycemia. She denies foot sores or paresthesias. She takes gabapentin  Hypertension and history of heart block: Reviewed recent cardiology records. Blood pressure remains controlled. Her pacer is stable. No medication changes recommended. Patient denies chest pain or shortness of breath. Energy level is fairly good.  Chronic low back pain due to lumbar spinal stenosis: Fortunately her gait has improved. She completed physical therapy and continues to have home physical therapy. Balance is improving. She continues with daily pain. Managing with 3 times daily narcotics. Understands risk versus benefits. Family believes she is not a surgical candidate. No new symptoms. We increase the dose of her gabapentin which has helped improve her neurogenic claudication symptoms. No adverse effects.  Vitamin B12 deficiency. She is not certain if she is taking vitamin B12 at this time. Reviewed her medications  Hyperlipidemia on statin: LDL is at goal.  Request refill of Voltaren gel: We started using this on her knees and low back and she feels it is helping significantly. Likely her osteoarthritis  PAD: Not symptomatic. Instructed to stop Plavix per cardiology notes.   Wt Readings from Last 3 Encounters:  08/26/20 195 lb 9.6 oz (88.7 kg)  07/11/20 198 lb (89.8 kg)  05/09/20 199 lb (90.3 kg)    BP Readings from Last 3 Encounters:  08/26/20 132/62  07/11/20 135/71  05/09/20 (!) 156/98    Assessment  1. Type 2 diabetes, controlled,  with neuropathy ([place])   2. Hypertension associated with diabetes ([place])   3. Spinal stenosis of lumbar region with neurogenic claudication   4. Hyperlipidemia associated with type 2 diabetes mellitus ([place])   5. Chronic midline low back pain with bilateral sciatica   6. Chronic narcotic dependence ([place])   7. Vitamin B12 deficiency   8. 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, CHB   9. Chronic pain of both knees      Plan   Diabetes is currently very well controlled. Continue current medications. She is due an eye exam has a scheduled for February. Immunizations are up-to-date.  Hypertension is controlled  Chronic low back pain due to spinal stenosis: Stable on chronic narcotics. Again discussed risk versus benefits. Continue high-dose gabapentin. This is helpful.  Hyperlipidemia: Statin  B12 deficiency: Recommend restarting oral B12 daily. We will recheck levels at next visit.  Osteoarthritis knees: Refilled Voltaren gel  Follow up: 3 or 4 months. For complete physical Orders Placed This Encounter  Procedures   POCT HgB A1C   Meds ordered this encounter  Medications   diclofenac Sodium (VOLTAREN) 1 % GEL    Sig: Apply 4 g topically 4 (four) times daily as needed (pain).    Dispense:  400 g    Refill:  3   atorvastatin (LIPITOR) 40 MG tablet    Sig: Take 1 tablet (40 mg total) by mouth at bedtime.    Dispense:  90 tablet    Refill:  3  HYDROcodone-acetaminophen (NORCO/VICODIN) 5-325 MG tablet    Sig: TAKE 1 TABLET BY MOUTH 3 TIMES DAILY AS NEEDED FOR MODERATE PAIN    Dispense:  90 tablet    Refill:  0   meclizine (ANTIVERT) 12.5 MG tablet    Sig: Take 1 tablet (12.5 mg total) by mouth 2 (two) times daily as needed for dizziness.    Dispense:  30 tablet    Refill:  2      Immunization History  Administered Date(s) Administered   Fluad Quad(high Dose 65+) 06/19/2019, 05/09/2020   Influenza, High Dose Seasonal PF  06/30/2017, 05/12/2018   PFIZER SARS-COV-2 Vaccination 10/06/2019, 10/29/2019, 06/13/2020   Pneumococcal Conjugate-13 05/12/2018   Pneumococcal Polysaccharide-23 07/13/2019   Tdap 06/15/2018   Zoster Recombinat (Shingrix) 07/30/2019    Diabetes Related Lab Review: Lab Results  Component Value Date   HGBA1C 5.8 (A) 08/26/2020   HGBA1C 6.1 (A) 05/09/2020   HGBA1C 6.2 (A) 02/06/2020    No results found for: Derl Barrow Lab Results  Component Value Date   CREATININE 0.99 04/04/2020   BUN 23 04/04/2020   NA 138 04/04/2020   K 5.1 04/04/2020   CL 100 04/04/2020   CO2 27 04/04/2020   Lab Results  Component Value Date   CHOL 142 02/06/2020   CHOL 133 03/28/2019   CHOL 136 09/26/2018   Lab Results  Component Value Date   HDL 51.80 02/06/2020   HDL 50.80 03/28/2019   HDL 53.10 09/26/2018   Lab Results  Component Value Date   LDLCALC 61 02/06/2020   LDLCALC 58 03/28/2019   LDLCALC 61 09/26/2018   Lab Results  Component Value Date   TRIG 143.0 02/06/2020   TRIG 120.0 03/28/2019   TRIG 110.0 09/26/2018   Lab Results  Component Value Date   CHOLHDL 3 02/06/2020   CHOLHDL 3 03/28/2019   CHOLHDL 3 09/26/2018   No results found for: LDLDIRECT The ASCVD Risk score Mikey Bussing DC Jr., et al., 2013) failed to calculate for the following reasons:   The 2013 ASCVD risk score is only valid for ages 32 to 60 I have reviewed the [place], Fam and Soc history. Patient Active Problem List   Diagnosis Date Noted   Lumbar stenosis 10/31/2019    Priority: High    Dr. Marlou Sa 2010 lumbar MRI, multilevel and severe. Failed cymbalta, PT, lyrica and gabapentin. Treated at [facility] with epidural spinal injections    Chronic narcotic dependence ([place]) 07/13/2019    Priority: High   PAD (peripheral artery disease) ([place]) 11/18/2016    Priority: High    MILD: LE [place] on 04/29/16, mild arterial occlusive disease. Mild right iliac arterial disease and mild left distal popliteal  disease. Right ABI 1.06, Left ABI 1.07.    Chronic midline low back pain with bilateral sciatica 10/02/2016    Priority: High   Severe obesity (BMI 35.0-35.9 with comorbidity) ([place]) 10/02/2016    Priority: High   Hyperlipidemia associated with type 2 diabetes mellitus ([place]) 10/02/2016    Priority: High   Hypertension associated with diabetes ([place]) 03/08/2016    Priority: High    Medications reviewed and adjusted today.     Type 2 diabetes, controlled, with neuropathy ([place]) 03/08/2016    Priority: High   History of complete heart block 08/20/2015    Priority: High    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 Dx:  2:1 AVB, CHB 08/20/2015    Priority: High    Scheduled Remote pacemaker check  08/12/2020: There was 1 atrial high rate episodes detected, brief PAT. There were 0 high ventricular rate episodes detected. Health trends do not demonstrate significant abnormality. Battery longevity is 10.5 years. RA pacing is <1.0 %, RV pacing is >99.0 %.    Urinary incontinence in female, followed by GYN, did not tolerate pessary 06/27/2018    Priority: Medium   LBBB (left bundle branch block) 11/18/2016    Priority: Medium    EKG 07/08/16: Sinus rhythm , rate 80, left axis deviation, left anterior fascicular block, left bundle branch block.    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 She has never used smokeless tobacco. She reports that she does not drink alcohol and does not use drugs.  Review of Systems: Ophthalmic: negative for eye pain,  loss of vision or double vision Cardiovascular: negative for chest pain Respiratory: negative for SOB or persistent cough Gastrointestinal: negative for abdominal pain Genitourinary: negative for dysuria or gross hematuria MSK: negative for foot lesions Neurologic: negative for weakness or gait disturbance  Objective  Vitals: BP 132/62    Pulse 83    Temp 98 F (36.7 C) (Temporal)    Resp 18    Ht 5\' 4"  (1.626 m)    Wt 195 lb 9.6 oz (88.7 kg)    LMP  (LMP Unknown)    SpO2 95%    BMI 33.57 kg/m  General: well appearing, no acute distress HEENT:  Normocephalic, atraumatic, moist mucous membranes, supple neck  Cardiovascular:  Nl S1 and S2, RRR without murmur, gallop or rub. no edema Respiratory:  Good breath sounds bilaterally, CTAB with normal effort, no rales      Diabetic education: ongoing education regarding chronic disease management for diabetes was given today. We continue to reinforce the ABC's of diabetic management: A1c (<7 or 8 dependent upon patient), tight blood pressure control, and cholesterol management with goal LDL < 100 minimally. We discuss diet strategies, exercise recommendations, medication options and possible side effects. At each visit, we review recommended immunizations and preventive care recommendations for diabetics and stress that good diabetic control can prevent other problems. See below for this patient's data.    Commons side effects, risks, benefits, and alternatives for medications and treatment plan prescribed today were discussed, and the patient expressed understanding of the given instructions. Patient is instructed to call or message via MyChart if he/she has any questions or concerns regarding our treatment plan. No barriers to understanding were identified. We discussed Red Flag symptoms and signs in detail. Patient expressed understanding regarding what to do in case of urgent or emergency type symptoms.   Medication list was reconciled, printed  and provided to the patient in AVS. Patient instructions and summary information was reviewed with the patient as documented in the AVS. This note was prepared with assistance of Dragon voice recognition software. Occasional wrong-word or sound-a-like substitutions may have occurred due to the inherent limitations of voice recognition software  This visit occurred during the SARS-CoV-2 public health emergency.  Safety protocols  were in place, including screening questions prior to the visit, additional usage of staff PPE, and extensive cleaning of exam room while observing appropriate contact time as indicated for disinfecting solutions.

## 2020-08-27 ENCOUNTER — Encounter: Payer: Self-pay | Admitting: Podiatry

## 2020-08-27 ENCOUNTER — Ambulatory Visit: Payer: Medicare Other | Admitting: Podiatry

## 2020-08-27 DIAGNOSIS — M79675 Pain in left toe(s): Secondary | ICD-10-CM

## 2020-08-27 DIAGNOSIS — I739 Peripheral vascular disease, unspecified: Secondary | ICD-10-CM

## 2020-08-27 DIAGNOSIS — E1142 Type 2 diabetes mellitus with diabetic polyneuropathy: Secondary | ICD-10-CM | POA: Diagnosis not present

## 2020-08-27 DIAGNOSIS — B351 Tinea unguium: Secondary | ICD-10-CM | POA: Diagnosis not present

## 2020-08-27 DIAGNOSIS — M79674 Pain in right toe(s): Secondary | ICD-10-CM | POA: Diagnosis not present

## 2020-08-27 NOTE — Progress Notes (Signed)
This patient returns to my office for at risk foot care.  This patient requires this care by a professional since this patient will be at risk due to having  Diabetes and PAD.  Patient is taking plavix.  This patient is unable to cut nails herself since the patient cannot reach her nails .  . nails.These nails are painful walking and wearing shoes.  This patient presents for at risk foot care today.  General Appearance  Alert, conversant and in no acute stress.  Vascular  Dorsalis pedis and posterior tibial  pulses are absent bilaterally.  Capillary return is within normal limits  bilaterally. Cold feet  bilaterally.  Neurologic  Senn-Weinstein monofilament wire absent   bilaterally. Muscle power within normal limits bilaterally.  Nails Thick disfigured discolored nails with subungual debris  from hallux to fifth toes bilaterally. No evidence of bacterial infection or drainage bilaterally.  Orthopedic  No limitations of motion  feet .  No crepitus or effusions noted.  No bony pathology or digital deformities noted.  Skin  normotropic skin with no porokeratosis noted bilaterally.  No signs of infections or ulcers noted.     Onychomycosis  Pain in right toes  Pain in left toes  Consent was obtained for treatment procedures.   Mechanical debridement of nails 1-5  bilaterally performed with a nail nipper.  Filed with dremel without incident. No infection or ulcer.     Return office visit  3 months        Told patient to return for periodic foot care and evaluation due to potential at risk complications.   Gardiner Barefoot DPM

## 2020-08-28 DIAGNOSIS — I447 Left bundle-branch block, unspecified: Secondary | ICD-10-CM | POA: Diagnosis not present

## 2020-08-28 DIAGNOSIS — L4050 Arthropathic psoriasis, unspecified: Secondary | ICD-10-CM | POA: Diagnosis not present

## 2020-08-28 DIAGNOSIS — M5442 Lumbago with sciatica, left side: Secondary | ICD-10-CM | POA: Diagnosis not present

## 2020-08-28 DIAGNOSIS — E1151 Type 2 diabetes mellitus with diabetic peripheral angiopathy without gangrene: Secondary | ICD-10-CM | POA: Diagnosis not present

## 2020-08-28 DIAGNOSIS — I10 Essential (primary) hypertension: Secondary | ICD-10-CM | POA: Diagnosis not present

## 2020-08-28 DIAGNOSIS — Z86718 Personal history of other venous thrombosis and embolism: Secondary | ICD-10-CM | POA: Diagnosis not present

## 2020-08-28 DIAGNOSIS — Z7984 Long term (current) use of oral hypoglycemic drugs: Secondary | ICD-10-CM | POA: Diagnosis not present

## 2020-08-28 DIAGNOSIS — E785 Hyperlipidemia, unspecified: Secondary | ICD-10-CM | POA: Diagnosis not present

## 2020-08-28 DIAGNOSIS — Z7902 Long term (current) use of antithrombotics/antiplatelets: Secondary | ICD-10-CM | POA: Diagnosis not present

## 2020-08-28 DIAGNOSIS — M5441 Lumbago with sciatica, right side: Secondary | ICD-10-CM | POA: Diagnosis not present

## 2020-08-28 DIAGNOSIS — G8929 Other chronic pain: Secondary | ICD-10-CM | POA: Diagnosis not present

## 2020-08-28 DIAGNOSIS — E1169 Type 2 diabetes mellitus with other specified complication: Secondary | ICD-10-CM | POA: Diagnosis not present

## 2020-08-28 DIAGNOSIS — M48061 Spinal stenosis, lumbar region without neurogenic claudication: Secondary | ICD-10-CM | POA: Diagnosis not present

## 2020-08-28 DIAGNOSIS — D509 Iron deficiency anemia, unspecified: Secondary | ICD-10-CM | POA: Diagnosis not present

## 2020-08-28 DIAGNOSIS — Z9181 History of falling: Secondary | ICD-10-CM | POA: Diagnosis not present

## 2020-09-03 ENCOUNTER — Other Ambulatory Visit: Payer: Self-pay | Admitting: Family Medicine

## 2020-09-05 ENCOUNTER — Telehealth: Payer: Self-pay

## 2020-09-05 NOTE — Telephone Encounter (Signed)
Noted  

## 2020-09-05 NOTE — Telephone Encounter (Signed)
Krista Clarke from PT [facility] has stated patient is a missed visit for today for PT due to patients family calling and letting him know patient is ill.

## 2020-09-08 ENCOUNTER — Telehealth: Payer: Self-pay

## 2020-09-08 NOTE — Telephone Encounter (Signed)
Home Health Certification or Plan of Care Tracking  this is a Certification and [place] Agency:[facility]  Order Number:  [id number]  Where has form been placed: In the basket up front

## 2020-09-09 DIAGNOSIS — M5441 Lumbago with sciatica, right side: Secondary | ICD-10-CM | POA: Diagnosis not present

## 2020-09-09 DIAGNOSIS — Z9181 History of falling: Secondary | ICD-10-CM | POA: Diagnosis not present

## 2020-09-09 DIAGNOSIS — L4050 Arthropathic psoriasis, unspecified: Secondary | ICD-10-CM | POA: Diagnosis not present

## 2020-09-09 DIAGNOSIS — Z86718 Personal history of other venous thrombosis and embolism: Secondary | ICD-10-CM | POA: Diagnosis not present

## 2020-09-09 DIAGNOSIS — I447 Left bundle-branch block, unspecified: Secondary | ICD-10-CM | POA: Diagnosis not present

## 2020-09-09 DIAGNOSIS — E1151 Type 2 diabetes mellitus with diabetic peripheral angiopathy without gangrene: Secondary | ICD-10-CM | POA: Diagnosis not present

## 2020-09-09 DIAGNOSIS — E785 Hyperlipidemia, unspecified: Secondary | ICD-10-CM | POA: Diagnosis not present

## 2020-09-09 DIAGNOSIS — G8929 Other chronic pain: Secondary | ICD-10-CM | POA: Diagnosis not present

## 2020-09-09 DIAGNOSIS — I10 Essential (primary) hypertension: Secondary | ICD-10-CM | POA: Diagnosis not present

## 2020-09-09 DIAGNOSIS — E1169 Type 2 diabetes mellitus with other specified complication: Secondary | ICD-10-CM | POA: Diagnosis not present

## 2020-09-09 DIAGNOSIS — Z7902 Long term (current) use of antithrombotics/antiplatelets: Secondary | ICD-10-CM | POA: Diagnosis not present

## 2020-09-09 DIAGNOSIS — Z7984 Long term (current) use of oral hypoglycemic drugs: Secondary | ICD-10-CM | POA: Diagnosis not present

## 2020-09-09 DIAGNOSIS — M48061 Spinal stenosis, lumbar region without neurogenic claudication: Secondary | ICD-10-CM | POA: Diagnosis not present

## 2020-09-09 DIAGNOSIS — M5442 Lumbago with sciatica, left side: Secondary | ICD-10-CM | POA: Diagnosis not present

## 2020-09-09 DIAGNOSIS — D509 Iron deficiency anemia, unspecified: Secondary | ICD-10-CM | POA: Diagnosis not present

## 2020-09-27 DIAGNOSIS — I447 Left bundle-branch block, unspecified: Secondary | ICD-10-CM | POA: Diagnosis not present

## 2020-09-27 DIAGNOSIS — D509 Iron deficiency anemia, unspecified: Secondary | ICD-10-CM | POA: Diagnosis not present

## 2020-09-27 DIAGNOSIS — M5442 Lumbago with sciatica, left side: Secondary | ICD-10-CM | POA: Diagnosis not present

## 2020-09-27 DIAGNOSIS — Z7902 Long term (current) use of antithrombotics/antiplatelets: Secondary | ICD-10-CM | POA: Diagnosis not present

## 2020-09-27 DIAGNOSIS — E1151 Type 2 diabetes mellitus with diabetic peripheral angiopathy without gangrene: Secondary | ICD-10-CM | POA: Diagnosis not present

## 2020-09-27 DIAGNOSIS — I10 Essential (primary) hypertension: Secondary | ICD-10-CM | POA: Diagnosis not present

## 2020-09-27 DIAGNOSIS — Z7984 Long term (current) use of oral hypoglycemic drugs: Secondary | ICD-10-CM | POA: Diagnosis not present

## 2020-09-27 DIAGNOSIS — Z86718 Personal history of other venous thrombosis and embolism: Secondary | ICD-10-CM | POA: Diagnosis not present

## 2020-09-27 DIAGNOSIS — M48061 Spinal stenosis, lumbar region without neurogenic claudication: Secondary | ICD-10-CM | POA: Diagnosis not present

## 2020-09-27 DIAGNOSIS — E785 Hyperlipidemia, unspecified: Secondary | ICD-10-CM | POA: Diagnosis not present

## 2020-09-27 DIAGNOSIS — M5441 Lumbago with sciatica, right side: Secondary | ICD-10-CM | POA: Diagnosis not present

## 2020-09-27 DIAGNOSIS — E1169 Type 2 diabetes mellitus with other specified complication: Secondary | ICD-10-CM | POA: Diagnosis not present

## 2020-09-27 DIAGNOSIS — Z9181 History of falling: Secondary | ICD-10-CM | POA: Diagnosis not present

## 2020-09-27 DIAGNOSIS — G8929 Other chronic pain: Secondary | ICD-10-CM | POA: Diagnosis not present

## 2020-09-27 DIAGNOSIS — L4050 Arthropathic psoriasis, unspecified: Secondary | ICD-10-CM | POA: Diagnosis not present

## 2020-10-10 DIAGNOSIS — H35372 Puckering of macula, left eye: Secondary | ICD-10-CM | POA: Diagnosis not present

## 2020-10-10 DIAGNOSIS — H5203 Hypermetropia, bilateral: Secondary | ICD-10-CM | POA: Diagnosis not present

## 2020-10-10 DIAGNOSIS — H2513 Age-related nuclear cataract, bilateral: Secondary | ICD-10-CM | POA: Diagnosis not present

## 2020-10-10 DIAGNOSIS — E119 Type 2 diabetes mellitus without complications: Secondary | ICD-10-CM | POA: Diagnosis not present

## 2020-10-13 ENCOUNTER — Other Ambulatory Visit: Payer: Self-pay | Admitting: Family Medicine

## 2020-10-13 DIAGNOSIS — E1159 Type 2 diabetes mellitus with other circulatory complications: Secondary | ICD-10-CM

## 2020-10-13 DIAGNOSIS — I152 Hypertension secondary to endocrine disorders: Secondary | ICD-10-CM

## 2020-10-16 ENCOUNTER — Encounter: Payer: Self-pay | Admitting: Family Medicine

## 2020-10-17 ENCOUNTER — Other Ambulatory Visit: Payer: Self-pay | Admitting: Family Medicine

## 2020-10-17 ENCOUNTER — Other Ambulatory Visit: Payer: Self-pay

## 2020-10-17 ENCOUNTER — Encounter: Payer: Self-pay | Admitting: Family Medicine

## 2020-10-17 ENCOUNTER — Ambulatory Visit (INDEPENDENT_AMBULATORY_CARE_PROVIDER_SITE_OTHER): Payer: Medicare Other | Admitting: Family Medicine

## 2020-10-17 VITALS — BP 134/76 | HR 71 | Temp 98.1°F | Resp 17 | Wt 198.0 lb

## 2020-10-17 DIAGNOSIS — M48062 Spinal stenosis, lumbar region with neurogenic claudication: Secondary | ICD-10-CM

## 2020-10-17 DIAGNOSIS — R202 Paresthesia of skin: Secondary | ICD-10-CM | POA: Diagnosis not present

## 2020-10-17 DIAGNOSIS — R350 Frequency of micturition: Secondary | ICD-10-CM | POA: Diagnosis not present

## 2020-10-17 DIAGNOSIS — M5441 Lumbago with sciatica, right side: Secondary | ICD-10-CM

## 2020-10-17 DIAGNOSIS — G8929 Other chronic pain: Secondary | ICD-10-CM | POA: Diagnosis not present

## 2020-10-17 DIAGNOSIS — M5442 Lumbago with sciatica, left side: Secondary | ICD-10-CM

## 2020-10-17 DIAGNOSIS — I872 Venous insufficiency (chronic) (peripheral): Secondary | ICD-10-CM

## 2020-10-17 DIAGNOSIS — R410 Disorientation, unspecified: Secondary | ICD-10-CM

## 2020-10-17 DIAGNOSIS — Z1231 Encounter for screening mammogram for malignant neoplasm of breast: Secondary | ICD-10-CM

## 2020-10-17 LAB — POCT URINALYSIS DIPSTICK
Blood, UA: NEGATIVE
Glucose, UA: NEGATIVE
Ketones, UA: NEGATIVE
Nitrite, UA: NEGATIVE
Protein, UA: POSITIVE — AB
Spec Grav, UA: 1.025 (ref 1.010–1.025)
Urobilinogen, UA: 0.2 E.U./dL
pH, UA: 6 (ref 5.0–8.0)

## 2020-10-17 LAB — URINALYSIS, ROUTINE W REFLEX MICROSCOPIC
Bilirubin Urine: NEGATIVE
Hgb urine dipstick: NEGATIVE
Ketones, ur: NEGATIVE
Nitrite: NEGATIVE
RBC / HPF: NONE SEEN (ref 0–?)
Specific Gravity, Urine: 1.015 (ref 1.000–1.030)
Total Protein, Urine: NEGATIVE
Urine Glucose: NEGATIVE
Urobilinogen, UA: 0.2 (ref 0.0–1.0)
pH: 5.5 (ref 5.0–8.0)

## 2020-10-17 MED ORDER — HYDROCODONE-ACETAMINOPHEN 5-325 MG PO TABS: 1.0000 | ORAL_TABLET | Freq: Three times a day (TID) | ORAL | 0 refills | Status: DC | PRN

## 2020-10-17 MED ORDER — TRIAMCINOLONE ACETONIDE 0.1 % EX CREA: 1.0000 "application " | TOPICAL_CREAM | Freq: Two times a day (BID) | CUTANEOUS | 0 refills | Status: DC

## 2020-10-17 NOTE — Progress Notes (Signed)
Subjective  CC:  Chief Complaint  Patient presents with  . Urinary Tract Infection    Confusion starting last week, frequency, but denies any burning   . Leg Pain    Right leg, mark on calf but not open wound     HPI: Krista Clarke is a 80 y.o. female who presents to the office today to address the problems listed above in the chief complaint.  80 year old with chronic pain and lumbar stenosis with neurogenic claudication and chronic peripheral paresthesias that are secondary complains of leg pain.  Her daughter was worried that it was coming from a scar that she has in the lower extremity.  She does keep some mild edema.  She has no rashes.  There is been no fevers or chills.  No open wounds.  No trauma.  She is on chronic hydrocodone.  Her daughter has noted that time she has been a little confused.  She worries that this is related to a UTI.  Patient admits that she might have had some mild increased frequency but denies dysuria or changes in urine.  She denies pelvic pain.  No fevers or chills.   Assessment  1. Confusion   2. Urinary frequency   3. Chronic venous insufficiency   4. Paresthesia of both lower extremities   5. Spinal stenosis of lumbar region with neurogenic claudication   6. Chronic midline low back pain with bilateral sciatica       Plan   Confusion: We will send urine for microscopy and culture.  She has had no recent urinary tract infections.  If urine culture is negative, will need to start assessment for cognitive impairment.  Discussed how this intermittent confusion could be related to her chronic narcotics.  She is a high risk patient.  She is alert and oriented today although she missed the date.  Paresthesias chronic with chronic low back pain: Reassured.  Skin is mildly sore to touch and could be related to chronic venous insufficiency.  Triamcinolone cream to be used sparingly and intermittently.  Reassured.  Follow up: As scheduled  scheduled 12/03/2020  Orders Placed This Encounter  Procedures  . Urine Culture  . Urinalysis, Routine w reflex microscopic  . POCT Urinalysis Dipstick   Meds ordered this encounter  Medications  . triamcinolone (KENALOG) 0.1 %    Sig: Apply 1 application topically 2 (two) times daily. For 2 weeks, then as needed    Dispense:  45 g    Refill:  0  . HYDROcodone-acetaminophen (NORCO/VICODIN) 5-325 MG tablet    Sig: Take 1 tablet by mouth 3 (three) times daily as needed for moderate pain.    Dispense:  90 tablet    Refill:  0      I reviewed the patients updated PMH, FH, and SocHx.    Patient Active Problem List   Diagnosis Date Noted  . Lumbar stenosis 10/31/2019    Priority: High  . Chronic narcotic dependence ([place]) 07/13/2019    Priority: High  . PAD (peripheral artery disease) ([place]) 11/18/2016    Priority: High  . Chronic midline low back pain with bilateral sciatica 10/02/2016    Priority: High  . Severe obesity (BMI 35.0-35.9 with comorbidity) ([place]) 10/02/2016    Priority: High  . Hyperlipidemia associated with type 2 diabetes mellitus ([place]) 10/02/2016    Priority: High  . Hypertension associated with diabetes ([place]) 03/08/2016    Priority: High  . Type 2 diabetes, controlled, with neuropathy ([place]) 03/08/2016  Priority: High  . History of complete heart block 08/20/2015    Priority: High  . 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 Urinary incontinence in female, followed by GYN, did not tolerate pessary 06/27/2018    Priority: Medium  . LBBB (left bundle branch block) 11/18/2016    Priority: Medium  . 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 Paresthesia of both lower extremities 10/02/2016    Priority: Medium  . Microcytic anemia 03/08/2016    Priority: Medium  . Vitamin B12 deficiency 02/06/2020    Priority: Low  . Chronic venous insufficiency 10/17/2020   . Encounter for care of pacemaker 05/24/2019   Current Meds  Medication Sig  . allopurinol (ZYLOPRIM) 100 MG tablet TAKE 1 TABLET BY MOUTH EVERY MORNING  . amLODipine (NORVASC) 10 MG tablet TAKE 1 TABLET BY MOUTH EVERY DAY  . ascorbic acid (VITAMIN C) 500 MG tablet TAKE 1 TABLET BY MOUTH EVERY DAY  . ASPIRIN LOW DOSE 81 MG chewable tablet CHEW ONE TABLET BY MOUTH EVERY DAY  . atorvastatin (LIPITOR) 40 MG tablet Take 1 tablet (40 mg total) by mouth at bedtime.  . diclofenac Sodium (VOLTAREN) 1 % GEL Apply 4 g topically 4 (four) times daily as needed (pain).  . FEROSUL 325 (65 Fe) MG tablet TAKE 1 TABLET BY MOUTH 2 TIMES DAILY  . furosemide (LASIX) 20 MG tablet TAKE 1 TABLET BY MOUTH EVERY DAY  . gabapentin (NEURONTIN) 300 MG capsule Take 2 capsules (600 mg total) by mouth 3 (three) times daily.  Marland Kitchen losartan (COZAAR) 50 MG tablet TAKE 1 TABLET BY MOUTH EVERY DAY  . Lutein 20 MG CAPS TAKE 1 CAPSULE BY MOUTH EVERY DAY  . Magnesium Oxide 400 (240 Mg) MG TABS TAKE 1 TABLET BY MOUTH EVERY DAY  . meclizine (ANTIVERT) 12.5 MG tablet Take 1 tablet (12.5 mg total) by mouth 2 (two) times daily as needed for dizziness.  . metFORMIN (GLUCOPHAGE) 1000 MG tablet TAKE 1 TABLET BY MOUTH 2 TIMES DAILY WITH A MEAL  . metoprolol tartrate (LOPRESSOR) 25 MG tablet TAKE 1 TABLET BY MOUTH 2 TIMES DAILY IN THE MORNING AND AT BEDTIME  . omeprazole (PRILOSEC) 20 MG capsule TAKE 1 CAPSULE BY MOUTH BEFORE BREAKFAST  . potassium chloride SA (KLOR-CON) 20 MEQ tablet TAKE 1 TABLET BY MOUTH EVERY DAY WITH FUROSEMIDE.  Marland Kitchen triamcinolone (KENALOG) 0.1 % Apply 1 application topically 2 (two) times daily. For 2 weeks, then as needed  . [DISCONTINUED] Ascorbic Acid (VITAMIN C PO) Take 500 mg by mouth daily.  . [DISCONTINUED] HYDROcodone-acetaminophen (NORCO/VICODIN) 5-325 MG tablet TAKE 1 TABLET BY MOUTH 3 TIMES DAILY AS NEEDED FOR MODERATE PAIN    Allergies: Patient is allergic to prednisone. Family History: Patient family  history includes Cancer in her brother; Diabetes in her mother; Hypertension in her father and mother. Social History:  Patient  reports that she has never smoked. She has never used smokeless tobacco. She reports that she does not drink alcohol and does not use drugs.  Review of Systems: Constitutional: Negative for fever malaise or anorexia Cardiovascular: negative for chest pain Respiratory: negative for SOB or persistent cough Gastrointestinal: negative for abdominal pain  Objective  Vitals: BP 134/76   Pulse 71   Temp 98.1 F (36.7 C) (Temporal)   Resp 17   Wt 198 lb (89.8 kg)   LMP  (LMP Unknown)  SpO2 98%   BMI 33.99 kg/m  General: no acute distress , A&Ox3, appears well HEENT: PEERL, conjunctiva normal, neck is supple Cardiovascular:  RRR without murmur or gallop.  Respiratory:  Good breath sounds bilaterally, CTAB with normal respiratory effort Gastrointestinal: soft, flat abdomen, normal active bowel sounds, no palpable masses, no suprapubic tenderness, no flank tenderness Lower extremities: Trace pitting edema bilaterally, tender to touch without masses, erythema or rash. Skin:  Warm, no rashes     Commons side effects, risks, benefits, and alternatives for medications and treatment plan prescribed today were discussed, and the patient expressed understanding of the given instructions. Patient is instructed to call or message via MyChart if he/she has any questions or concerns regarding our treatment plan. No barriers to understanding were identified. We discussed Red Flag symptoms and signs in detail. Patient expressed understanding regarding what to do in case of urgent or emergency type symptoms.   Medication list was reconciled, printed and provided to the patient in AVS. Patient instructions and summary information was reviewed with the patient as documented in the AVS. This note was prepared with assistance of Dragon voice recognition software. Occasional  wrong-word or sound-a-like substitutions may have occurred due to the inherent limitations of voice recognition software  This visit occurred during the SARS-CoV-2 public health emergency.  Safety protocols were in place, including screening questions prior to the visit, additional usage of staff PPE, and extensive cleaning of exam room while observing appropriate contact time as indicated for disinfecting solutions.

## 2020-10-17 NOTE — Patient Instructions (Signed)
Please follow up as scheduled for your next visit with me: 12/03/2020    I believe your legs hurt due to your back problems. They also might hurt due to the chronic swelling.  I do not see any infection or other problem at this time. Use the cream twice a day for a week or two. It may help but it might not if the pain is coming because of the back problems.   I will let you know about your urine.   If you have any questions or concerns, please don't hesitate to send me a message via MyChart or call the office at [phone]. Thank you for visiting with [place] today! It's our pleasure caring for you.

## 2020-10-18 LAB — URINE CULTURE
MICRO NUMBER:: 11552297
SPECIMEN QUALITY:: ADEQUATE

## 2020-10-20 ENCOUNTER — Other Ambulatory Visit: Payer: Self-pay | Admitting: Family Medicine

## 2020-10-20 DIAGNOSIS — K219 Gastro-esophageal reflux disease without esophagitis: Secondary | ICD-10-CM

## 2020-11-24 ENCOUNTER — Other Ambulatory Visit: Payer: Self-pay | Admitting: Family Medicine

## 2020-11-24 DIAGNOSIS — R600 Localized edema: Secondary | ICD-10-CM

## 2020-11-24 DIAGNOSIS — E1159 Type 2 diabetes mellitus with other circulatory complications: Secondary | ICD-10-CM

## 2020-11-25 ENCOUNTER — Ambulatory Visit (INDEPENDENT_AMBULATORY_CARE_PROVIDER_SITE_OTHER): Payer: Medicare Other | Admitting: Podiatry

## 2020-11-25 ENCOUNTER — Encounter: Payer: Self-pay | Admitting: Podiatry

## 2020-11-25 ENCOUNTER — Other Ambulatory Visit: Payer: Self-pay

## 2020-11-25 DIAGNOSIS — B351 Tinea unguium: Secondary | ICD-10-CM

## 2020-11-25 DIAGNOSIS — M79674 Pain in right toe(s): Secondary | ICD-10-CM

## 2020-11-25 DIAGNOSIS — M79675 Pain in left toe(s): Secondary | ICD-10-CM | POA: Diagnosis not present

## 2020-11-25 DIAGNOSIS — D689 Coagulation defect, unspecified: Secondary | ICD-10-CM

## 2020-11-25 DIAGNOSIS — I739 Peripheral vascular disease, unspecified: Secondary | ICD-10-CM

## 2020-11-25 DIAGNOSIS — M2141 Flat foot [pes planus] (acquired), right foot: Secondary | ICD-10-CM

## 2020-11-25 DIAGNOSIS — M2142 Flat foot [pes planus] (acquired), left foot: Secondary | ICD-10-CM

## 2020-11-25 DIAGNOSIS — E1142 Type 2 diabetes mellitus with diabetic polyneuropathy: Secondary | ICD-10-CM

## 2020-11-25 NOTE — Progress Notes (Signed)
This patient returns to my office for at risk foot care.  This patient requires this care by a professional since this patient will be at risk due to having  Diabetes and PAD.  Patient is taking plavix.  This patient is unable to cut nails herself since the patient cannot reach her nails .  . nails.These nails are painful walking and wearing shoes.  This patient presents for at risk foot care today.  General Appearance  Alert, conversant and in no acute stress.  Vascular  Dorsalis pedis and posterior tibial  pulses are absent bilaterally.  Capillary return is within normal limits  bilaterally. Cold feet  bilaterally.  Neurologic  Senn-Weinstein monofilament wire absent   bilaterally. Muscle power within normal limits bilaterally.  Nails Thick disfigured discolored nails with subungual debris  from hallux to fifth toes bilaterally. No evidence of bacterial infection or drainage bilaterally.  Orthopedic  No limitations of motion  feet .  No crepitus or effusions noted.  No bony pathology or digital deformities noted.  Skin  normotropic skin with no porokeratosis noted bilaterally.  No signs of infections or ulcers noted.     Onychomycosis  Pain in right toes  Pain in left toes  Consent was obtained for treatment procedures.   Mechanical debridement of nails 1-5  bilaterally performed with a nail nipper.  Filed with dremel without incident. No infection or ulcer.     Return office visit  3 months        Told patient to return for periodic foot care and evaluation due to potential at risk complications.   Gardiner Barefoot DPM

## 2020-12-03 ENCOUNTER — Other Ambulatory Visit: Payer: Self-pay

## 2020-12-03 ENCOUNTER — Encounter: Payer: Self-pay | Admitting: Family Medicine

## 2020-12-03 ENCOUNTER — Ambulatory Visit (INDEPENDENT_AMBULATORY_CARE_PROVIDER_SITE_OTHER): Payer: Medicare Other | Admitting: Family Medicine

## 2020-12-03 VITALS — BP 124/74 | HR 78 | Temp 97.3°F | Resp 16 | Ht 64.0 in | Wt 195.2 lb

## 2020-12-03 DIAGNOSIS — I152 Hypertension secondary to endocrine disorders: Secondary | ICD-10-CM

## 2020-12-03 DIAGNOSIS — E1159 Type 2 diabetes mellitus with other circulatory complications: Secondary | ICD-10-CM

## 2020-12-03 DIAGNOSIS — E1169 Type 2 diabetes mellitus with other specified complication: Secondary | ICD-10-CM

## 2020-12-03 DIAGNOSIS — E538 Deficiency of other specified B group vitamins: Secondary | ICD-10-CM

## 2020-12-03 DIAGNOSIS — E1140 Type 2 diabetes mellitus with diabetic neuropathy, unspecified: Secondary | ICD-10-CM

## 2020-12-03 DIAGNOSIS — M5442 Lumbago with sciatica, left side: Secondary | ICD-10-CM

## 2020-12-03 DIAGNOSIS — G8929 Other chronic pain: Secondary | ICD-10-CM

## 2020-12-03 DIAGNOSIS — L4050 Arthropathic psoriasis, unspecified: Secondary | ICD-10-CM | POA: Diagnosis not present

## 2020-12-03 DIAGNOSIS — Z7185 Encounter for immunization safety counseling: Secondary | ICD-10-CM | POA: Diagnosis not present

## 2020-12-03 DIAGNOSIS — E785 Hyperlipidemia, unspecified: Secondary | ICD-10-CM

## 2020-12-03 DIAGNOSIS — M109 Gout, unspecified: Secondary | ICD-10-CM | POA: Insufficient documentation

## 2020-12-03 DIAGNOSIS — M5441 Lumbago with sciatica, right side: Secondary | ICD-10-CM

## 2020-12-03 DIAGNOSIS — I872 Venous insufficiency (chronic) (peripheral): Secondary | ICD-10-CM | POA: Diagnosis not present

## 2020-12-03 DIAGNOSIS — F1120 Opioid dependence, uncomplicated: Secondary | ICD-10-CM

## 2020-12-03 DIAGNOSIS — M48062 Spinal stenosis, lumbar region with neurogenic claudication: Secondary | ICD-10-CM | POA: Diagnosis not present

## 2020-12-03 DIAGNOSIS — Z0000 Encounter for general adult medical examination without abnormal findings: Secondary | ICD-10-CM

## 2020-12-03 LAB — COMPREHENSIVE METABOLIC PANEL
ALT: 12 U/L (ref 0–35)
AST: 12 U/L (ref 0–37)
Albumin: 4.2 g/dL (ref 3.5–5.2)
Alkaline Phosphatase: 64 U/L (ref 39–117)
BUN: 25 mg/dL — ABNORMAL HIGH (ref 6–23)
CO2: 27 mEq/L (ref 19–32)
Calcium: 9.9 mg/dL (ref 8.4–10.5)
Chloride: 101 mEq/L (ref 96–112)
Creatinine, Ser: 0.97 mg/dL (ref 0.40–1.20)
GFR: 54.9 mL/min — ABNORMAL LOW (ref >60.00)
Glucose, Bld: 116 mg/dL — ABNORMAL HIGH (ref 70–99)
Potassium: 4.4 mEq/L (ref 3.5–5.1)
Sodium: 139 mEq/L (ref 135–145)
Total Bilirubin: 0.5 mg/dL (ref 0.2–1.2)
Total Protein: 7.1 g/dL (ref 6.0–8.3)

## 2020-12-03 LAB — CBC WITH DIFFERENTIAL/PLATELET
Basophils Absolute: 0 10*3/uL (ref 0.0–0.1)
Basophils Relative: 0.4 % (ref 0.0–3.0)
Eosinophils Absolute: 0.1 10*3/uL (ref 0.0–0.7)
Eosinophils Relative: 1.3 % (ref 0.0–5.0)
HCT: 29.8 % — ABNORMAL LOW (ref 36.0–46.0)
Hemoglobin: 9.6 g/dL — ABNORMAL LOW (ref 12.0–15.0)
Lymphocytes Relative: 17.4 % (ref 12.0–46.0)
Lymphs Abs: 1.2 10*3/uL (ref 0.7–4.0)
MCHC: 32.2 g/dL (ref 30.0–36.0)
MCV: 77.2 fl — ABNORMAL LOW (ref 78.0–100.0)
Monocytes Absolute: 0.5 10*3/uL (ref 0.1–1.0)
Monocytes Relative: 7.9 % (ref 3.0–12.0)
Neutro Abs: 4.8 10*3/uL (ref 1.4–7.7)
Neutrophils Relative %: 73 % (ref 43.0–77.0)
Platelets: 250 10*3/uL (ref 150.0–400.0)
RBC: 3.86 Mil/uL — ABNORMAL LOW (ref 3.87–5.11)
RDW: 16.5 % — ABNORMAL HIGH (ref 11.5–15.5)
WBC: 6.6 10*3/uL (ref 4.0–10.5)

## 2020-12-03 LAB — TSH: TSH: 4.74 u[IU]/mL — ABNORMAL HIGH (ref 0.35–4.50)

## 2020-12-03 LAB — LIPID PANEL
Cholesterol: 126 mg/dL (ref 0–200)
HDL: 49.9 mg/dL (ref >39.00)
LDL Cholesterol: 50 mg/dL (ref 0–99)
NonHDL: 75.66
Total CHOL/HDL Ratio: 3
Triglycerides: 127 mg/dL (ref 0.0–149.0)
VLDL: 25.4 mg/dL (ref 0.0–40.0)

## 2020-12-03 LAB — B12 AND FOLATE PANEL
Folate: 11.8 ng/mL (ref >5.9)
Vitamin B-12: 1104 pg/mL — ABNORMAL HIGH (ref 211–911)

## 2020-12-03 LAB — URIC ACID: Uric Acid, Serum: 5.9 mg/dL (ref 2.4–7.0)

## 2020-12-03 LAB — HEMOGLOBIN A1C: Hgb A1c MFr Bld: 6.6 % — ABNORMAL HIGH (ref 4.6–6.5)

## 2020-12-03 MED ORDER — HYDROCODONE-ACETAMINOPHEN 5-325 MG PO TABS: 1.0000 | ORAL_TABLET | Freq: Three times a day (TID) | ORAL | 0 refills | Status: DC | PRN

## 2020-12-03 NOTE — Patient Instructions (Signed)
Please return in 3 months for recheck  I will release your lab results to you on your MyChart account with further instructions. Please reply with any questions.   I have not changed any medications today.  Glad you are feeling well.   If you have any questions or concerns, please don't hesitate to send me a message via MyChart or call the office at [phone]. Thank you for visiting with [place] today! It's our pleasure caring for you.

## 2020-12-03 NOTE — Progress Notes (Signed)
Subjective  Chief Complaint  Patient presents with  . Annual Exam    Fasting   . Hypertension  . Diabetes  . Hyperlipidemia  . Wound Check    Left leg    HPI: Krista Clarke is a 80 y.o. female who presents to [facility] [place] [facility] today for a Female Wellness Visit. She also has the concerns and/or needs as listed above in the chief complaint. These will be addressed in addition to the Health Maintenance Visit.   Wellness Visit: annual visit with health maintenance review and exam without Pap   HM: 80 yo. No longer getting mammograms. Had recent normal dexa scan so no further screens indicated. She reports she is feeling well! Good appetite. Eating well. Due for 2nd shingrix Chronic disease f/u and/or acute problem visit: (deemed necessary to be done in addition to the wellness visit):  HTN: on several medications: amlodipine 10, losartan 50, and metoprolol 25 bid. Tolerating all well. No AEs. Leg swelling is controlled with lasix. No cp or sob. No orthopnea  DM: maintaining good control with metformin 1000 bid. No sxs of high or lows. Checks sugars once or twice a day. Eye exam reportedly normal. No foot sores. Gabapentin helping with neuropathy pains.   Chronic pain on chronic narcotics w/ decreased use and good control. Understands risks and benefits. Functioning better. Daughter reports normal mental status. No falls. Uses walker for ambulation.   HLD on atrovastatin 20 nightly and fasting for recheck. No AEs  b12 deficiency: no longer on supplements. Levels had gotten high. Nl diet.   H/o gout on suppression; allopurinol 100 daily. No recent flares.   Psoriasis with arthropathy: has dry patches on hands but she reports it is stable. No new arthralgias  Assessment  1. Annual physical exam   2. Hypertension associated with diabetes ([place])   3. Type 2 diabetes, controlled, with neuropathy ([place])   4. Chronic midline low back pain with bilateral sciatica   5.  Hyperlipidemia associated with type 2 diabetes mellitus ([place])   6. Chronic narcotic dependence ([place])   7. Spinal stenosis of lumbar region with neurogenic claudication   8. 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[place])   9. Chronic venous insufficiency   10. Vitamin B12 deficiency   11. Gout, unspecified cause, unspecified chronicity, unspecified site   12. Vaccine counseling      Plan  Female Wellness Visit:  Age appropriate Health Maintenance and Prevention measures were discussed with patient. Included topics are cancer screening recommendations, ways to keep healthy (see AVS) including dietary and exercise recommendations, regular eye and dental care, use of seat belts, and avoidance of moderate alcohol use and tobacco use. Screens are up to date.  BMI: discussed patient's BMI and encouraged positive lifestyle modifications to help get to or maintain a target BMI.  HM needs and immunizations were addressed and ordered. See below for orders. See HM and immunization section for updates. Due 2nd shingrix  Routine labs and screening tests ordered including cmp, cbc and lipids where appropriate.  Discussed recommendations regarding Vit D and calcium supplementation (see AVS)  Chronic disease management visit and/or acute problem visit:  HTN: well controlled on amlodipine (with stable leg edema), metoprolol 25 bid and losartan 50. Stable kidney function.  HLD on atorvastatin 20; recheck fasting levels today with lfts  Chronic narcotics: I reviewed patient's records from the PMP aware controlled substance registry today.  Refilled meds. Pain is controlled. Fall risk prevention reviewed.   Psoriasis  is stable.   Recheck b12 levels off supplements. Reg diet. No PPI  Gout on low dose allopurinol 100 daily. Check uric acid levels. Goal < 6 but hasn't had any flares. ? If needed.   DM: has been well controlled with metformin. Recheck levels. Clinically well.   Follow up: 3 months for diabetes and  recheck.   Orders Placed This Encounter  Procedures  . CBC with Differential/Platelet  . B12 and Folate Panel  . Comprehensive metabolic panel  . Lipid panel  . Hemoglobin A1c  . TSH  . Uric acid   Meds ordered this encounter  Medications  . HYDROcodone-acetaminophen (NORCO/VICODIN) 5-325 MG tablet    Sig: Take 1 tablet by mouth 3 (three) times daily as needed for moderate pain.    Dispense:  90 tablet    Refill:  0      Body mass index is 33.51 kg/m. Wt Readings from Last 3 Encounters:  12/03/20 195 lb 3.2 oz (88.5 kg)  10/17/20 198 lb (89.8 kg)  08/26/20 195 lb 9.6 oz (88.7 kg)     Patient Active Problem List   Diagnosis Date Noted  . Lumbar stenosis 10/31/2019    Priority: High    Dr. Marlou Sa 2010 lumbar MRI, multilevel and severe. Failed cymbalta, PT, lyrica and gabapentin. Treated at [facility] with epidural spinal injections   . Chronic narcotic dependence ([place]) 07/13/2019    Priority: High  . PAD (peripheral artery disease) ([place]) 11/18/2016    Priority: High    MILD: LE [place] on 04/29/16, mild arterial occlusive disease. Mild right iliac arterial disease and mild left distal popliteal disease. Right ABI 1.06, Left ABI 1.07.   Marland Kitchen Chronic midline low back pain with bilateral sciatica 10/02/2016    Priority: High  . Severe obesity (BMI 35.0-35.9 with comorbidity) ([place]) 10/02/2016    Priority: High  . Hyperlipidemia associated with type 2 diabetes mellitus ([place]) 10/02/2016    Priority: High  . Hypertension associated with diabetes ([place]) 03/08/2016    Priority: High  . Type 2 diabetes, controlled, with neuropathy ([place]) 03/08/2016    Priority: High  . History of complete heart block 08/20/2015    Priority: High    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 There were 0 high ventricular rate episodes detected. Health trends do not demonstrate significant abnormality. Battery longevity is 10.5 years. RA pacing is <1.0 %, RV pacing is >99.0 %.   . Gout 12/03/2020    Priority: Medium  . Chronic venous insufficiency 10/17/2020    Priority: Medium  . Urinary incontinence in female, followed by GYN, did not tolerate pessary 06/27/2018    Priority: Medium  . LBBB (left bundle branch block) 11/18/2016    Priority: Medium    EKG 07/08/16: Sinus rhythm , rate 80, left axis deviation, left anterior fascicular block, left bundle branch block.   . 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 Paresthesia of both lower extremities 10/02/2016    Priority: Medium    Due to lumbar stenosis.neurontin   . Microcytic anemia 03/08/2016    Priority: Medium  . Vitamin B12 deficiency 02/06/2020    Priority: Low  . Encounter for care of  pacemaker 05/24/2019   Health Maintenance  Topic Date Due  . HEMOGLOBIN A1C  02/24/2021  . INFLUENZA VACCINE  03/30/2021  . FOOT EXAM  05/09/2021  . OPHTHALMOLOGY EXAM  08/30/2021  . TETANUS/TDAP  06/15/2028  . DEXA SCAN  Completed  . COVID-19 Vaccine  Completed  . PNA vac Low Risk Adult  Completed  . HPV VACCINES  Aged Out   Immunization History  Administered Date(s) Administered  . Fluad Quad(high Dose 65+) 06/19/2019, 05/09/2020  . Influenza, High Dose Seasonal PF 06/30/2017, 05/12/2018  . PFIZER(Purple Top)SARS-COV-2 Vaccination 10/06/2019, 10/29/2019, 06/13/2020  . Pneumococcal Conjugate-13 05/12/2018  . Pneumococcal Polysaccharide-23 07/13/2019  . Tdap 06/15/2018  . Zoster Recombinat (Shingrix) 07/30/2019   We updated and reviewed the patient's past history in detail and it is documented below. Allergies: Patient is allergic to prednisone. 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 Past Surgical History Patient  has a past surgical history that includes Tubal ligation; Abdominal hysterectomy; Colonoscopy with propofol (N/A, 07/03/2013); left heart catheterization with coronary angiogram (N/A, 08/27/2014); Cardiac catheterization (N/A, 08/20/2015); and Joint replacement (Bilateral). Family History: Patient family history includes Cancer in her brother; Diabetes in her mother; Hypertension in her father and mother. Social History:  Patient  reports that she has never smoked. She has never used smokeless tobacco. She reports that she does not drink alcohol and does not use drugs.  Review of Systems: Constitutional: negative for fever or malaise Ophthalmic: negative for photophobia, double vision or loss of vision Cardiovascular: negative for chest pain, dyspnea on exertion, or new LE swelling Respiratory: negative for SOB or persistent cough Gastrointestinal: negative for abdominal pain, change in bowel habits or melena Genitourinary: negative for dysuria or gross hematuria, no abnormal uterine bleeding or disharge Musculoskeletal: negative for new gait disturbance or muscular weakness Integumentary: negative for new or persistent rashes, no breast lumps Neurological: negative for TIA or stroke symptoms Psychiatric: negative for SI or delusions Allergic/Immunologic: negative for hives  Patient Care Team    Relationship Specialty Notifications Start End  Leamon Arnt, MD PCP - General Family Medicine  07/12/19   Adrian Prows, MD Consulting Physician Cardiology  11/18/16   Specialists, [place] Physician Orthopedic Surgery  04/28/17   Melissa Noon, [place] Referring Physician Optometry  04/28/17   Bjorn Loser, MD Consulting Physician Urology  07/13/19   Gardiner Barefoot, DPM Consulting Physician Podiatry  12/20/19     Objective  Vitals: BP 124/74   Pulse 78   Temp (!) 97.3 F (36.3 C) (Temporal)   Resp 16   Ht '5\' 4"'$  (1.626 m)   Wt 195 lb 3.2 oz (88.5 kg)   LMP  (LMP Unknown)   SpO2 96%   BMI 33.51 kg/m  General:  Well developed, well nourished, no acute distress , looks good today Psych:  Alert and orientedx3,normal mood and affect HEENT:  Normocephalic, atraumatic, non-icteric sclera,  supple neck without adenopathy, mass or thyromegaly Cardiovascular:  Normal S1, S2, RRR without gallop, rub or murmur Respiratory:  Good breath sounds bilaterally, CTAB with normal respiratory effort Gastrointestinal: normal bowel sounds, soft, non-tender, no noted masses. No HSM Ext: tr edema. No foot lesions.  Commons side effects, risks, benefits, and alternatives for medications and treatment plan prescribed today were discussed, and the patient expressed understanding of the given instructions. Patient is instructed to call or message via MyChart if he/she has any questions or concerns regarding our treatment plan. No barriers to understanding were identified. We discussed Red Flag symptoms and signs in detail. Patient expressed understanding regarding what to do in case of urgent or emergency type symptoms.   Medication list was reconciled, printed and provided to the patient in AVS. Patient instructions and summary information was reviewed with the patient as documented in the AVS. This note was prepared with assistance of Dragon voice recognition software. Occasional wrong-word or sound-a-like substitutions may have occurred due to the inherent limitations of voice recognition software  This visit occurred during the SARS-CoV-2 public health emergency.  Safety protocols were in place, including screening questions prior to the visit,  additional usage of staff PPE, and extensive cleaning of exam room while observing appropriate contact time as indicated for disinfecting solutions.

## 2020-12-06 ENCOUNTER — Encounter: Payer: Self-pay | Admitting: Family Medicine

## 2020-12-06 NOTE — Progress Notes (Signed)
Please call patient: I have reviewed his/her lab results. Labs are stable. No need to change medications. Diabetic control is slightly worse but still control is ok. Wil recheck in 3 months.  Will recheck thyroid levels at that time as well.

## 2020-12-08 ENCOUNTER — Other Ambulatory Visit: Payer: Self-pay

## 2020-12-08 ENCOUNTER — Ambulatory Visit
Admission: RE | Admit: 2020-12-08 | Discharge: 2020-12-08 | Disposition: A | Discharge status: Home / Self Care | Payer: Medicare Other | Source: Ambulatory Visit | Attending: Family Medicine | Admitting: Family Medicine

## 2020-12-08 DIAGNOSIS — Z1231 Encounter for screening mammogram for malignant neoplasm of breast: Secondary | ICD-10-CM | POA: Diagnosis not present

## 2020-12-19 DIAGNOSIS — E785 Hyperlipidemia, unspecified: Secondary | ICD-10-CM | POA: Diagnosis not present

## 2020-12-19 DIAGNOSIS — I10 Essential (primary) hypertension: Secondary | ICD-10-CM | POA: Diagnosis not present

## 2020-12-19 DIAGNOSIS — E1169 Type 2 diabetes mellitus with other specified complication: Secondary | ICD-10-CM | POA: Diagnosis not present

## 2020-12-19 DIAGNOSIS — E1159 Type 2 diabetes mellitus with other circulatory complications: Secondary | ICD-10-CM | POA: Diagnosis not present

## 2021-01-05 ENCOUNTER — Other Ambulatory Visit: Payer: Self-pay | Admitting: Family Medicine

## 2021-01-05 DIAGNOSIS — R202 Paresthesia of skin: Secondary | ICD-10-CM

## 2021-01-19 ENCOUNTER — Other Ambulatory Visit: Payer: Self-pay | Admitting: Family Medicine

## 2021-01-19 DIAGNOSIS — K219 Gastro-esophageal reflux disease without esophagitis: Secondary | ICD-10-CM

## 2021-02-09 ENCOUNTER — Other Ambulatory Visit: Payer: Self-pay | Admitting: Family Medicine

## 2021-02-10 DIAGNOSIS — I442 Atrioventricular block, complete: Secondary | ICD-10-CM | POA: Diagnosis not present

## 2021-02-10 DIAGNOSIS — Z950 Presence of cardiac pacemaker: Secondary | ICD-10-CM | POA: Diagnosis not present

## 2021-02-10 DIAGNOSIS — Z45018 Encounter for adjustment and management of other part of cardiac pacemaker: Secondary | ICD-10-CM | POA: Diagnosis not present

## 2021-03-03 ENCOUNTER — Other Ambulatory Visit: Payer: Self-pay | Admitting: Family Medicine

## 2021-03-06 ENCOUNTER — Ambulatory Visit (INDEPENDENT_AMBULATORY_CARE_PROVIDER_SITE_OTHER): Payer: Medicare Other

## 2021-03-06 DIAGNOSIS — Z0000 Encounter for general adult medical examination without abnormal findings: Secondary | ICD-10-CM

## 2021-03-06 NOTE — Patient Instructions (Signed)
Krista Clarke , Thank you for taking time to come for your Medicare Wellness Visit. I appreciate your ongoing commitment to your health goals. Please review the following plan we discussed and let me know if I can assist you in the future.   Screening recommendations/referrals: Colonoscopy: No longer required  Mammogram: Done 12/08/20 repeat every year  Bone Density: Done 01/31/15 repeat per provider  Recommended yearly ophthalmology/optometry visit for glaucoma screening and checkup Recommended yearly dental visit for hygiene and checkup  Vaccinations: Influenza vaccine: Due 03/30/21 Pneumococcal vaccine: Completed  Tdap vaccine: Done 06/15/18 repeat in 10 years 06/15/28 Shingles vaccine: Pt stated she received both doses and will bring in information at visit   Covid-19:Completed 2/6, 3/1, 06/13/20  Advanced directives: Advance directive discussed with you today. I have provided a copy for you to complete at home and have notarized. Once this is complete please bring a copy in to our office so we can scan it into your chart.  Conditions/risks identified: Walk without uses of walker   Next appointment: Follow up in one year for your annual wellness visit    Preventive Care 65 Years and Older, Female Preventive care refers to lifestyle choices and visits with your health care provider that can promote health and wellness. What does preventive care include? A yearly physical exam. This is also called an annual well check. Dental exams once or twice a year. Routine eye exams. Ask your health care provider how often you should have your eyes checked. Personal lifestyle choices, including: Daily care of your teeth and gums. Regular physical activity. Eating a healthy diet. Avoiding tobacco and drug use. Limiting alcohol use. Practicing safe sex. Taking low-dose aspirin every day. Taking vitamin and mineral supplements as recommended by your health care provider. What happens during an annual  well check? The services and screenings done by your health care provider during your annual well check will depend on your age, overall health, lifestyle risk factors, and family history of disease. Counseling  Your health care provider may ask you questions about your: Alcohol use. Tobacco use. Drug use. Emotional well-being. Home and relationship well-being. Sexual activity. Eating habits. History of falls. Memory and ability to understand (cognition). Work and work Statistician. Reproductive health. Screening  You may have the following tests or measurements: Height, weight, and BMI. Blood pressure. Lipid and cholesterol levels. These may be checked every 5 years, or more frequently if you are over 73 years old. Skin check. Lung cancer screening. You may have this screening every year starting at age 23 if you have a 30-pack-year history of smoking and currently smoke or have quit within the past 15 years. Fecal occult blood test (FOBT) of the stool. You may have this test every year starting at age 25. Flexible sigmoidoscopy or colonoscopy. You may have a sigmoidoscopy every 5 years or a colonoscopy every 10 years starting at age 46. Hepatitis C blood test. Hepatitis B blood test. Sexually transmitted disease (STD) testing. Diabetes screening. This is done by checking your blood sugar (glucose) after you have not eaten for a while (fasting). You may have this done every 1-3 years. Bone density scan. This is done to screen for osteoporosis. You may have this done starting at age 31. Mammogram. This may be done every 1-2 years. Talk to your health care provider about how often you should have regular mammograms. Talk with your health care provider about your test results, treatment options, and if necessary, the need for more tests.  Vaccines  Your health care provider may recommend certain vaccines, such as: Influenza vaccine. This is recommended every year. Tetanus, diphtheria,  and acellular pertussis (Tdap, Td) vaccine. You may need a Td booster every 10 years. Zoster vaccine. You may need this after age 102. Pneumococcal 13-valent conjugate (PCV13) vaccine. One dose is recommended after age 2. Pneumococcal polysaccharide (PPSV23) vaccine. One dose is recommended after age 47. Talk to your health care provider about which screenings and vaccines you need and how often you need them. This information is not intended to replace advice given to you by your health care provider. Make sure you discuss any questions you have with your health care provider. Document Released: 09/12/2015 Document Revised: 05/05/2016 Document Reviewed: 06/17/2015 Elsevier Interactive Patient Education  2017 [place] Prevention in the Home Falls can cause injuries. They can happen to people of all ages. There are many things you can do to make your home safe and to help prevent falls. What can I do on the outside of my home? Regularly fix the edges of walkways and driveways and fix any cracks. Remove anything that might make you trip as you walk through a door, such as a raised step or threshold. Trim any bushes or trees on the path to your home. Use bright outdoor lighting. Clear any walking paths of anything that might make someone trip, such as rocks or tools. Regularly check to see if handrails are loose or broken. Make sure that both sides of any steps have handrails. Any raised decks and porches should have guardrails on the edges. Have any leaves, snow, or ice cleared regularly. Use sand or salt on walking paths during winter. Clean up any spills in your garage right away. This includes oil or grease spills. What can I do in the bathroom? Use night lights. Install grab bars by the toilet and in the tub and shower. Do not use towel bars as grab bars. Use non-skid mats or decals in the tub or shower. If you need to sit down in the shower, use a plastic, non-slip  stool. Keep the floor dry. Clean up any water that spills on the floor as soon as it happens. Remove soap buildup in the tub or shower regularly. Attach bath mats securely with double-sided non-slip rug tape. Do not have throw rugs and other things on the floor that can make you trip. What can I do in the bedroom? Use night lights. Make sure that you have a light by your bed that is easy to reach. Do not use any sheets or blankets that are too big for your bed. They should not hang down onto the floor. Have a firm chair that has side arms. You can use this for support while you get dressed. Do not have throw rugs and other things on the floor that can make you trip. What can I do in the kitchen? Clean up any spills right away. Avoid walking on wet floors. Keep items that you use a lot in easy-to-reach places. If you need to reach something above you, use a strong step stool that has a grab bar. Keep electrical cords out of the way. Do not use floor polish or wax that makes floors slippery. If you must use wax, use non-skid floor wax. Do not have throw rugs and other things on the floor that can make you trip. What can I do with my stairs? Do not leave any items on the stairs. Make sure that  there are handrails on both sides of the stairs and use them. Fix handrails that are broken or loose. Make sure that handrails are as long as the stairways. Check any carpeting to make sure that it is firmly attached to the stairs. Fix any carpet that is loose or worn. Avoid having throw rugs at the top or bottom of the stairs. If you do have throw rugs, attach them to the floor with carpet tape. Make sure that you have a light switch at the top of the stairs and the bottom of the stairs. If you do not have them, ask someone to add them for you. What else can I do to help prevent falls? Wear shoes that: Do not have high heels. Have rubber bottoms. Are comfortable and fit you well. Are closed at the  toe. Do not wear sandals. If you use a stepladder: Make sure that it is fully opened. Do not climb a closed stepladder. Make sure that both sides of the stepladder are locked into place. Ask someone to hold it for you, if possible. Clearly mark and make sure that you can see: Any grab bars or handrails. First and last steps. Where the edge of each step is. Use tools that help you move around (mobility aids) if they are needed. These include: Canes. Walkers. Scooters. Crutches. Turn on the lights when you go into a dark area. Replace any light bulbs as soon as they burn out. Set up your furniture so you have a clear path. Avoid moving your furniture around. If any of your floors are uneven, fix them. If there are any pets around you, be aware of where they are. Review your medicines with your doctor. Some medicines can make you feel dizzy. This can increase your chance of falling. Ask your doctor what other things that you can do to help prevent falls. This information is not intended to replace advice given to you by your health care provider. Make sure you discuss any questions you have with your health care provider. Document Released: 06/12/2009 Document Revised: 01/22/2016 Document Reviewed: 09/20/2014 Elsevier Interactive Patient Education  2017 [employer].

## 2021-03-06 NOTE — Progress Notes (Signed)
Virtual Visit via Telephone Note  I connected with  Krista Clarke on 03/06/21 at 11:45 AM EDT by telephone and verified that I am speaking with the correct person using two identifiers.  Medicare Annual Wellness visit completed telephonically due to Covid-19 pandemic.   Persons participating in this call: This Health Coach and this patient.   Location: Patient: Home Provider: Office   I discussed the limitations, risks, security and privacy concerns of performing an evaluation and management service by telephone and the availability of in person appointments. The patient expressed understanding and agreed to proceed.  Unable to perform video visit due to video visit attempted and failed and/or patient does not have video capability.   Some vital signs may be absent or patient reported.   Willette Brace, LPN   Subjective:   Krista Clarke is a 80 y.o. female who presents for Medicare Annual (Subsequent) preventive examination.  Review of Systems     Cardiac Risk Factors include: advanced age (>23mn, >>89women);obesity (BMI >30kg/m2);diabetes mellitus;hypertension;dyslipidemia     Objective:    Today's Vitals   03/06/21 1145  PainSc: 7    There is no height or weight on file to calculate BMI.  Advanced Directives 03/06/2021 04/04/2020 12/20/2019 03/28/2019 06/28/2018 05/30/2018 04/28/2017  Does Patient Have a Medical Advance Directive? No No Yes No No No No  Type of Advance Directive - - Living will;Healthcare Power of Attorney - - - -  Does patient want to make changes to medical advance directive? - - No - Patient declined - - - -  Copy of [place] Chart? - - No - copy requested - - - -  Would patient like information on creating a medical advance directive? Yes (MAU/Ambulatory/Procedural Areas - Information given) No - Patient declined - - No - Patient declined No - Patient declined Yes (MAU/Ambulatory/Procedural Areas - Information given)    Current  Medications (verified) Outpatient Encounter Medications as of 03/06/2021  Medication Sig   allopurinol (ZYLOPRIM) 100 MG tablet TAKE 1 TABLET BY MOUTH EVERY MORNING   amLODipine (NORVASC) 10 MG tablet TAKE 1 TABLET BY MOUTH EVERY DAY   ASPIRIN LOW DOSE 81 MG chewable tablet CHEW ONE TABLET BY MOUTH EVERY DAY   atorvastatin (LIPITOR) 40 MG tablet Take 1 tablet (40 mg total) by mouth at bedtime.   clopidogrel (PLAVIX) 75 MG tablet Take 75 mg by mouth daily.   diclofenac Sodium (VOLTAREN) 1 % GEL Apply 4 g topically 4 (four) times daily as needed (pain).   FEROSUL 325 (65 Fe) MG tablet TAKE 1 TABLET BY MOUTH 2 TIMES DAILY   furosemide (LASIX) 20 MG tablet TAKE 1 TABLET BY MOUTH EVERY DAY   gabapentin (NEURONTIN) 300 MG capsule TAKE 2 CAPSULES BY MOUTH 3 TIMES DAILY   HYDROcodone-acetaminophen (NORCO/VICODIN) 5-325 MG tablet TAKE 1 TABLET BY MOUTH 3 TIMES DAILY AS NEEDED FOR moderate PAIN   losartan (COZAAR) 50 MG tablet TAKE 1 TABLET BY MOUTH EVERY DAY   Lutein 20 MG CAPS TAKE 1 CAPSULE BY MOUTH EVERY DAY   Magnesium Oxide 400 (240 Mg) MG TABS TAKE 1 TABLET BY MOUTH EVERY DAY   meclizine (ANTIVERT) 12.5 MG tablet Take 1 tablet (12.5 mg total) by mouth 2 (two) times daily as needed for dizziness.   metFORMIN (GLUCOPHAGE) 1000 MG tablet TAKE 1 TABLET BY MOUTH 2 TIMES DAILY WITH A MEAL   metoprolol tartrate (LOPRESSOR) 25 MG tablet TAKE 1 TABLET BY MOUTH 2 TIMES  DAILY IN THE MORNING AND AT BEDTIME   omeprazole (PRILOSEC) 20 MG capsule TAKE 1 CAPSULE BY MOUTH EVERY DAY BEFORE BREAKFAST   potassium chloride SA (KLOR-CON) 20 MEQ tablet TAKE 1 TABLET BY MOUTH EVERY DAY WITH FUROSEMIDE.   triamcinolone (KENALOG) 0.1 % Apply 1 application topically 2 (two) times daily. For 2 weeks, then as needed   vitamin C (ASCORBIC ACID) 500 MG tablet TAKE 1 TABLET BY MOUTH EVERY DAY   No facility-administered encounter medications on file as of 03/06/2021.    Allergies (verified) Prednisone   History: Past Medical  History:  Diagnosis Date   Anemia    takes iron daily   Arthritis    Complete heart block ([place]) 08/20/2015   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     Diabetes mellitus without complication ([place])    Encounter for care of pacemaker 05/24/2019   Hypercholesteremia    Hypertension    Pacemaker; 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 EGMs show AT/VP. There were 0 high ventricular rate episodes detected. Health trends do not demonstrate significant abnormality. Battery longevity is 10.4 - 11.2 years. RA pacing is 1.1 %, RV pacing is >99 %.  Scheduled  In office pacemaker check 07/12/19  Single (S)/Dual (D)/BV: D. Presenting ASVP @ 70/min.    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 Marlou Sa 2010 lumbar MRI, multilevel and severe.   Past Surgical History:  Procedure Laterality Date   ABDOMINAL HYSTERECTOMY     COLONOSCOPY WITH PROPOFOL N/A 07/03/2013   Procedure: COLONOSCOPY WITH PROPOFOL;  Surgeon: Garlan Fair, MD;  Location: [facility] END[facility];  Service: Endoscopy;  Laterality: N/A;   EP IMPLANTABLE DEVICE N/A 08/20/2015   Procedure: Pacemaker Implant;  Surgeon: Evans Lance, MD;  Location: [facility];  Service: Cardiovascular;  Laterality: N/A;   JOINT REPLACEMENT Bilateral    '99. Knees   LEFT HEART CATHETERIZATION WITH CORONARY ANGIOGRAM N/A 08/27/2014   Procedure: LEFT HEART CATHETERIZATION WITH CORONARY ANGIOGRAM;  Surgeon: Laverda Page, MD;  Location: [facility] CATH LAB;  Service: Cardiovascular;  Laterality: N/A;   TUBAL LIGATION     Family History  Problem Relation Age of Onset   Hypertension Mother    Diabetes Mother    Hypertension Father    Cancer Brother    Social History   Socioeconomic History   Marital status: Widowed    Spouse name: Not on file   Number of children: 5   Years of  education: Not on file   Highest education level: Not on file  Occupational History   Occupation: Retired   Tobacco Use   Smoking status: Never   Smokeless tobacco: Never  Vaping Use   Vaping Use: Never used  Substance and Sexual Activity   Alcohol use: No   Drug use: No   Sexual activity: Not Currently  Other Topics Concern   Not on file  Social History Narrative   Lives alone   No longer drives    Has life alert system    Social Determinants of Health   Financial Resource Strain: Low Risk    Difficulty of Paying Living Expenses: Not hard at all  Food Insecurity: No Food Insecurity   Worried About Estate manager/land agent of Food in the Last Year: Never true  Ran Out of Food in the Last Year: Never true  Transportation Needs: No Transportation Needs   Lack of Transportation (Medical): No   Lack of Transportation (Non-Medical): No  Physical Activity: Inactive   Days of Exercise per Week: 0 days   Minutes of Exercise per Session: 0 min  Stress: No Stress Concern Present   Feeling of Stress : Not at all  Social Connections: Moderately Isolated   Frequency of Communication with Friends and Family: More than three times a week   Frequency of Social Gatherings with Friends and Family: Twice a week   Attends Religious Services: More than 4 times per year   Active Member of [employer] or Organizations: No   Attends Archivist Meetings: Never   Marital Status: Widowed    Tobacco Counseling Counseling given: Not Answered   Clinical Intake:  Pre-visit preparation completed: Yes  Pain : 0-10 Pain Score: 7  Pain Type: Chronic pain Pain Location: Leg (legs and knee) Pain Orientation: Left, Right Pain Descriptors / Indicators: Aching Pain Onset: More than a month ago Pain Frequency: Constant     BMI - recorded: 33.51 Nutritional Status: BMI > 30  Obese Nutritional Risks: None Diabetes: Yes CBG done?: Yes (121) CBG resulted in Enter/ Edit results?: No Did pt. bring in  CBG monitor from home?: No  How often do you need to have someone help you when you read instructions, pamphlets, or other written materials from your doctor or pharmacy?: 1 - Never  Diabetic?Nutrition Risk Assessment:  Has the patient had any N/V/D within the last 2 months?  No  Does the patient have any non-healing wounds?  No  Has the patient had any unintentional weight loss or weight gain?  No   Diabetes:  Is the patient diabetic?  Yes  If diabetic, was a CBG obtained today?  Yes  Did the patient bring in their glucometer from home?  No  How often do you monitor your CBG's? As needed .   Financial Strains and Diabetes Management:  Are you having any financial strains with the device, your supplies or your medication? No .  Does the patient want to be seen by Chronic Care Management for management of their diabetes?  No  Would the patient like to be referred to a Nutritionist or for Diabetic Management?  No   Diabetic Exams:  Diabetic Eye Exam: Completed 08/30/20 Diabetic Foot Exam: Completed 05/09/20   Interpreter Needed?: No  Information entered by :: Charlott Rakes, LPN   Activities of Daily Living In your present state of health, do you have any difficulty performing the following activities: 03/06/2021 12/03/2020  Hearing? N N  Vision? N N  Difficulty concentrating or making decisions? Tempie Donning  Walking or climbing stairs? N Y  Dressing or bathing? N Y  Doing errands, shopping? N Y  Conservation officer, nature and eating ? N -  Using the Toilet? N -  In the past six months, have you accidently leaked urine? Y -  Comment pt wears a brief or pad -  Do you have problems with loss of bowel control? N -  Managing your Medications? N -  Managing your Finances? N -  Housekeeping or managing your Housekeeping? N -  Some recent data might be hidden    Patient Care Team: Leamon Arnt, MD as PCP - General (Family Medicine) Adrian Prows, MD as Consulting Physician  (Cardiology) [facility], [place] as Consulting Physician (Orthopedic Surgery) Melissa Noon, [place] as  Referring Physician (Optometry) Bjorn Loser, MD as Consulting Physician (Urology) Gardiner Barefoot, DPM as Consulting Physician (Podiatry)  Indicate any recent Medical Services you may have received from other than [facility] providers in the past year (date may be approximate).     Assessment:   This is a routine wellness examination for Annalisa.  Hearing/Vision screen No results found.  Dietary issues and exercise activities discussed: Current Exercise Habits: The patient does not participate in regular exercise at present   Goals Addressed             This Visit's Progress    Patient Stated       To be able to walk without walker         Depression Screen PHQ 2/9 Scores 03/06/2021 12/03/2020 12/20/2019 07/13/2019 03/28/2019 03/28/2019 06/28/2018  PHQ - 2 Score 0 0 0 0 0 2 0  PHQ- 9 Score - - - - 2 4 -    Fall Risk Fall Risk  03/06/2021 12/03/2020 05/09/2020 02/06/2020 01/29/2020  Falls in the past year? 0 0 0 0 0  Comment - - - - -  Number falls in past yr: 0 0 0 0 0  Injury with Fall? 0 0 0 0 0  Risk Factor Category  - - - - -  Risk for fall due to : Impaired vision;Impaired balance/gait;Impaired mobility Impaired balance/gait;Impaired mobility Impaired balance/gait;Impaired mobility Impaired balance/gait;Impaired mobility -  Follow up Falls prevention discussed - - - -  Comment - - - - -    FALL RISK PREVENTION PERTAINING TO THE HOME:  Any stairs in or around the home? No  If so, are there any without handrails? No  Home free of loose throw rugs in walkways, pet beds, electrical cords, etc? Yes  Adequate lighting in your home to reduce risk of falls? Yes   ASSISTIVE DEVICES UTILIZED TO PREVENT FALLS:  Life alert? Yes  Use of a cane, walker or w/c? Yes  Grab bars in the bathroom? Yes  Shower chair or bench in shower? Yes  Elevated toilet seat or a  handicapped toilet? Yes   TIMED UP AND GO:  Was the test performed? No   Cognitive Function: MMSE - Mini Mental State Exam 04/28/2017  Orientation to time 5  Orientation to Place 5  Registration 3  Attention/ Calculation 3  Recall 1  Language- name 2 objects 2  Language- repeat 1  Language- follow 3 step command 3  Language- read & follow direction 1  Write a sentence 1  Copy design 1  Total score 26     6CIT Screen 12/20/2019  What Year? 0 points  What month? 0 points  What time? 0 points  Count back from 20 0 points  Months in reverse 0 points  Repeat phrase 0 points  Total Score 0    Immunizations Immunization History  Administered Date(s) Administered   Fluad Quad(high Dose 65+) 06/19/2019, 05/09/2020   Influenza, High Dose Seasonal PF 06/30/2017, 05/12/2018   PFIZER(Purple Top)SARS-COV-2 Vaccination 10/06/2019, 10/29/2019, 06/13/2020   Pneumococcal Conjugate-13 05/12/2018   Pneumococcal Polysaccharide-23 07/13/2019   Tdap 06/15/2018   Zoster Recombinat (Shingrix) 07/30/2019    TDAP status: Up to date  Flu Vaccine status: Up to date  Pneumococcal vaccine status: Up to date  Covid-19 vaccine status: Completed vaccines  Qualifies for Shingles Vaccine? Yes   Zostavax completed Yes   Shingrix Completed?: Yes  Screening Tests Health Maintenance  Topic Date Due   Zoster Vaccines- Shingrix (2 of  2) 09/24/2019   COVID-19 Vaccine (4 - Booster for Pfizer series) 09/13/2020   INFLUENZA VACCINE  03/30/2021   FOOT EXAM  05/09/2021   HEMOGLOBIN A1C  06/04/2021   OPHTHALMOLOGY EXAM  08/30/2021   TETANUS/TDAP  06/15/2028   DEXA SCAN  Completed   PNA vac Low Risk Adult  Completed   HPV VACCINES  Aged Out    Health Maintenance  Health Maintenance Due  Topic Date Due   Zoster Vaccines- Shingrix (2 of 2) 09/24/2019   COVID-19 Vaccine (4 - Booster for Pfizer series) 09/13/2020    Colorectal cancer screening: No longer required.   Mammogram status:  Completed 12/08/20. Repeat every year  Bone Density status: Completed 01/31/15. Results reflect: Bone density results: NORMAL. Repeat every 2 years.    Additional Screening:   Vision Screening: Recommended annual ophthalmology exams for early detection of glaucoma and other disorders of the eye. Is the patient up to date with their annual eye exam?  Yes  Who is the provider or what is the name of the office in which the patient attends annual eye exams? Dr Corene Cornea If pt is not established with a provider, would they like to be referred to a provider to establish care? No .   Dental Screening: Recommended annual dental exams for proper oral hygiene  Community Resource Referral / Chronic Care Management: CRR required this visit?  No   CCM required this visit?  No      Plan:     I have personally reviewed and noted the following in the patient's chart:   Medical and social history Use of alcohol, tobacco or illicit drugs  Current medications and supplements including opioid prescriptions.  Functional ability and status Nutritional status Physical activity Advanced directives List of other physicians Hospitalizations, surgeries, and ER visits in previous 12 months Vitals Screenings to include cognitive, depression, and falls Referrals and appointments  In addition, I have reviewed and discussed with patient certain preventive protocols, quality metrics, and best practice recommendations. A written personalized care plan for preventive services as well as general preventive health recommendations were provided to patient.     Willette Brace, LPN   [id number]   Nurse Notes: None

## 2021-03-09 ENCOUNTER — Other Ambulatory Visit: Payer: Self-pay

## 2021-03-09 ENCOUNTER — Ambulatory Visit (INDEPENDENT_AMBULATORY_CARE_PROVIDER_SITE_OTHER): Payer: Medicare Other | Admitting: Family Medicine

## 2021-03-09 ENCOUNTER — Encounter: Payer: Self-pay | Admitting: Family Medicine

## 2021-03-09 ENCOUNTER — Ambulatory Visit: Payer: Medicare Other | Admitting: Family Medicine

## 2021-03-09 VITALS — BP 154/80 | HR 80 | Temp 98.1°F | Wt 199.6 lb

## 2021-03-09 DIAGNOSIS — S8012XA Contusion of left lower leg, initial encounter: Secondary | ICD-10-CM | POA: Diagnosis not present

## 2021-03-09 DIAGNOSIS — M48062 Spinal stenosis, lumbar region with neurogenic claudication: Secondary | ICD-10-CM

## 2021-03-09 DIAGNOSIS — R946 Abnormal results of thyroid function studies: Secondary | ICD-10-CM | POA: Diagnosis not present

## 2021-03-09 DIAGNOSIS — Z6835 Body mass index (BMI) 35.0-35.9, adult: Secondary | ICD-10-CM

## 2021-03-09 DIAGNOSIS — E1140 Type 2 diabetes mellitus with diabetic neuropathy, unspecified: Secondary | ICD-10-CM | POA: Diagnosis not present

## 2021-03-09 DIAGNOSIS — I152 Hypertension secondary to endocrine disorders: Secondary | ICD-10-CM

## 2021-03-09 DIAGNOSIS — E1159 Type 2 diabetes mellitus with other circulatory complications: Secondary | ICD-10-CM

## 2021-03-09 DIAGNOSIS — D638 Anemia in other chronic diseases classified elsewhere: Secondary | ICD-10-CM | POA: Diagnosis not present

## 2021-03-09 DIAGNOSIS — R7989 Other specified abnormal findings of blood chemistry: Secondary | ICD-10-CM | POA: Diagnosis not present

## 2021-03-09 DIAGNOSIS — F1120 Opioid dependence, uncomplicated: Secondary | ICD-10-CM

## 2021-03-09 LAB — POCT GLYCOSYLATED HEMOGLOBIN (HGB A1C): Hemoglobin A1C: 6.1 % — AB (ref 4.0–5.6)

## 2021-03-09 LAB — T3: T3, Total: 82 ng/dL (ref 76–181)

## 2021-03-09 MED ORDER — HYDROCODONE-ACETAMINOPHEN 5-325 MG PO TABS: 1.0000 | ORAL_TABLET | Freq: Two times a day (BID) | ORAL | 0 refills | Status: DC

## 2021-03-09 NOTE — Patient Instructions (Signed)
Please return in 3 months to recheck diabetes and back pain  Please decrease the pain pill to twice daily as needed. I have refilled it for you.  Your diabetes looks good.   If you have any questions or concerns, please don't hesitate to send me a message via MyChart or call the office at [phone]. Thank you for visiting with [place] today! It's our pleasure caring for you.

## 2021-03-09 NOTE — Progress Notes (Signed)
Subjective  CC:  Chief Complaint  Patient presents with   Diabetes   Leg Injury    Yesterday got tangled up in her rolator, hurt her left shin    HPI: Krista Clarke is a 80 y.o. female who presents to the office today for follow up of diabetes and problems listed above in the chief complaint.  Diabetes follow up: Her diabetic control is reported as Improved. Tolerating meds. No Aes.  She denies exertional CP or SOB or symptomatic hypoglycemia. She denies foot sores or paresthesias.  Yesterday, DOI 03/08/2021; was trying to get out of bed and got stuck in between mattress and railing. Left lower leg took brunt of it as she lowered herself out of the bed. Now sore.  Chronic back pain on norco - now tolerating twice a day with some days using tid dosing. No changes.  HTN: well controlled typically. Elevated today due to pain response. Taking all meds as prescribed.  Abnl tsh: minimally elevated 3 mo ago w/o sxs of low thyroid. Due for recheck.   Wt Readings from Last 3 Encounters:  03/09/21 199 lb 9.6 oz (90.5 kg)  12/03/20 195 lb 3.2 oz (88.5 kg)  10/17/20 198 lb (89.8 kg)    BP Readings from Last 3 Encounters:  03/09/21 (!) 154/80  12/03/20 124/74  10/17/20 134/76    Assessment  1. Type 2 diabetes, controlled, with neuropathy ([place])   2. Severe obesity (BMI 35.0-35.9 with comorbidity) (HCC) Chronic  3. Hypertension associated with diabetes ([place])   4. Chronic disease anemia   5. Abnormal TSH   6. Spinal stenosis of lumbar region with neurogenic claudication   7. Chronic narcotic dependence ([place])      Plan  Diabetes is currently very well controlled. Continue current medications HTN: elevated today but has been well controlled. Recheck 3 months. Likely due to pain response.  Contusion leg" reassured. No sign of serious injury Chronic pain on chronic narcotics: decrease dose to bid norco.  Obesity and HLD are stable.  Recheck thyroid labs.  Follow up: No follow-ups on  file.. Orders Placed This Encounter  Procedures   T3   T4, free   TSH   POCT HgB A1C   Meds ordered this encounter  Medications   HYDROcodone-acetaminophen (NORCO/VICODIN) 5-325 MG tablet    Sig: Take 1 tablet by mouth in the morning and at bedtime.    Dispense:  60 tablet    Refill:  0      Immunization History  Administered Date(s) Administered   Fluad Quad(high Dose 65+) 06/19/2019, 05/09/2020   Influenza, High Dose Seasonal PF 06/30/2017, 05/12/2018   PFIZER(Purple Top)SARS-COV-2 Vaccination 10/06/2019, 10/29/2019, 06/13/2020   Pneumococcal Conjugate-13 05/12/2018   Pneumococcal Polysaccharide-23 07/13/2019   Tdap 06/15/2018   Zoster Recombinat (Shingrix) 07/30/2019    Diabetes Related Lab Review: Lab Results  Component Value Date   HGBA1C 6.1 (A) 03/09/2021   HGBA1C 6.6 (H) 12/03/2020   HGBA1C 5.8 (A) 08/26/2020    No results found for: Derl Barrow Lab Results  Component Value Date   CREATININE 0.97 12/03/2020   BUN 25 (H) 12/03/2020   NA 139 12/03/2020   K 4.4 12/03/2020   CL 101 12/03/2020   CO2 27 12/03/2020   Lab Results  Component Value Date   CHOL 126 12/03/2020   CHOL 142 02/06/2020   CHOL 133 03/28/2019   Lab Results  Component Value Date   HDL 49.90 12/03/2020   HDL 51.80 02/06/2020  HDL 50.80 03/28/2019   Lab Results  Component Value Date   LDLCALC 50 12/03/2020   LDLCALC 61 02/06/2020   LDLCALC 58 03/28/2019   Lab Results  Component Value Date   TRIG 127.0 12/03/2020   TRIG 143.0 02/06/2020   TRIG 120.0 03/28/2019   Lab Results  Component Value Date   CHOLHDL 3 12/03/2020   CHOLHDL 3 02/06/2020   CHOLHDL 3 03/28/2019   No results found for: LDLDIRECT The ASCVD Risk score Mikey Bussing DC Jr., et al., 2013) failed to calculate for the following reasons:   The 2013 ASCVD risk score is only valid for ages 7 to 22 I have reviewed the [place], Fam and Soc history. Patient Active Problem List   Diagnosis Date Noted   Lumbar  stenosis 10/31/2019    Priority: High    Dr. Marlou Sa 2010 lumbar MRI, multilevel and severe. Failed cymbalta, PT, lyrica and gabapentin. Treated at [facility] with epidural spinal injections     Chronic narcotic dependence ([place]) 07/13/2019    Priority: High   PAD (peripheral artery disease) ([place]) 11/18/2016    Priority: High    MILD: LE [place] on 04/29/16, mild arterial occlusive disease. Mild right iliac arterial disease and mild left distal popliteal disease. Right ABI 1.06, Left ABI 1.07.     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, CHB 08/20/2015    Priority: High    Schedule remote pacemaker dual-chamber transmission 02/10/2021: Longevity >10 years.  Lead thresholds and impedance normal. AP 2%, VP 100%.  1 mode switch, brief PAT.  Mode switches <1%.  AT/AF burden 0%.     Gout 12/03/2020    Priority: Medium   Chronic venous insufficiency 10/17/2020    Priority: Medium   Urinary incontinence in female, followed by GYN, did not tolerate pessary 06/27/2018    Priority: Medium   LBBB (left bundle branch block) 11/18/2016    Priority: Medium    EKG 07/08/16: Sinus rhythm , rate 80, left axis deviation, left anterior fascicular block, left bundle branch block.     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 She has never used smokeless tobacco. She reports that she does not drink alcohol and does not use drugs.  Review of Systems: Ophthalmic: negative for eye pain, loss of vision or double vision Cardiovascular: negative for chest pain Respiratory: negative for SOB or persistent cough Gastrointestinal: negative for abdominal pain Genitourinary: negative for dysuria or gross hematuria  MSK: negative for foot lesions Neurologic: negative for weakness or gait disturbance  Objective  Vitals: BP (!) 154/80   Pulse 80   Temp 98.1 F (36.7 C) (Temporal)   Wt 199 lb 9.6 oz (90.5 kg)   LMP  (LMP Unknown)   SpO2 95%   BMI 34.26 kg/m  General: well appearing, no acute distress  Psych:  Alert and oriented, normal mood and affect, appears happy today Cardiovascular:  Nl S1 and S2, RRR without murmur, gallop or rub. no edema Respiratory:  Good breath sounds bilaterally, CTAB with normal effort, no rales Skin:  Warm, no rashes Neurologic:   Mental status is normal. normal gait Foot exam: no erythema, pallor, or cyanosis visible nl proprioception and sensation to monofilament testing bilaterally, +2 distal pulses bilaterally Left shin is tender w/o bruising or deformity   Diabetic education: ongoing education regarding chronic disease management for diabetes was given today. We continue to reinforce the ABC's of diabetic management: A1c (<7 or 8 dependent upon patient), tight blood pressure control, and cholesterol management with goal LDL < 100 minimally. We discuss diet strategies, exercise recommendations, medication options and possible side effects. At each visit, we review recommended immunizations and preventive care recommendations for  diabetics and stress that good diabetic control can prevent other problems. See below for this patient's data.   Commons side effects, risks, benefits, and alternatives for medications and treatment plan prescribed today were discussed, and the patient expressed understanding of the given instructions. Patient is instructed to call or message via MyChart if he/she has any questions or concerns regarding our treatment plan. No barriers to understanding were identified. We discussed Red Flag symptoms and signs in detail. Patient expressed understanding regarding what to do in case of urgent or emergency type symptoms.  Medication list was reconciled, printed and provided to the patient in AVS. Patient instructions and summary information was reviewed with the patient as documented in the AVS. This note was prepared with assistance of Dragon voice recognition software. Occasional wrong-word or sound-a-like substitutions may have occurred due to the inherent limitations of voice recognition software  This visit occurred during the SARS-CoV-2 public health emergency.  Safety protocols were in place, including screening questions prior to the visit, additional usage of staff PPE, and extensive cleaning of exam room while observing appropriate contact time as indicated for disinfecting solutions.

## 2021-03-10 LAB — T4, FREE: Free T4: 0.92 ng/dL (ref 0.60–1.60)

## 2021-03-10 LAB — TSH: TSH: 4.28 u[IU]/mL (ref 0.35–5.50)

## 2021-03-11 ENCOUNTER — Ambulatory Visit: Payer: Medicare Other | Admitting: Podiatry

## 2021-03-24 DIAGNOSIS — I10 Essential (primary) hypertension: Secondary | ICD-10-CM | POA: Diagnosis not present

## 2021-03-24 DIAGNOSIS — E1159 Type 2 diabetes mellitus with other circulatory complications: Secondary | ICD-10-CM | POA: Diagnosis not present

## 2021-03-24 DIAGNOSIS — E1169 Type 2 diabetes mellitus with other specified complication: Secondary | ICD-10-CM | POA: Diagnosis not present

## 2021-03-24 DIAGNOSIS — E785 Hyperlipidemia, unspecified: Secondary | ICD-10-CM | POA: Diagnosis not present

## 2021-04-01 ENCOUNTER — Other Ambulatory Visit: Payer: Self-pay | Admitting: Family Medicine

## 2021-04-06 ENCOUNTER — Telehealth: Payer: Self-pay | Admitting: Lab

## 2021-04-06 NOTE — Chronic Care Management (AMB) (Signed)
  Chronic Care Management   Note  04/06/2021 Name: TENESSA BRENTON MRN: [id number] DOB: 06/16/1941  TARINA SCALISE is a 80 y.o. year old female who is a primary care patient of Leamon Arnt, MD. I reached out to Charline Bills by phone today in response to a referral sent by Ms. Charniece L Beharry's PCP, Leamon Arnt, MD.   Ms. Lovegrove was given information about Chronic Care Management services today including:  CCM service includes personalized support from designated clinical staff supervised by her physician, including individualized plan of care and coordination with other care providers 24/7 contact phone numbers for assistance for urgent and routine care needs. Service will only be billed when office clinical staff spend 20 minutes or more in a month to coordinate care. Only one practitioner may furnish and bill the service in a calendar month. The patient may stop CCM services at any time (effective at the end of the month) by phone call to the office staff.   Patient agreed to services and verbal consent obtained.   Follow up plan:   [place]

## 2021-04-30 ENCOUNTER — Other Ambulatory Visit: Payer: Self-pay | Admitting: Family Medicine

## 2021-04-30 NOTE — Telephone Encounter (Signed)
Pt requesting refill for Hydrocodone. Last OV 03/09/2021.

## 2021-05-04 ENCOUNTER — Encounter: Payer: Self-pay | Admitting: Family Medicine

## 2021-05-05 ENCOUNTER — Other Ambulatory Visit: Payer: Self-pay

## 2021-05-05 DIAGNOSIS — M48062 Spinal stenosis, lumbar region with neurogenic claudication: Secondary | ICD-10-CM

## 2021-05-05 NOTE — Progress Notes (Signed)
Order placed for physical therapy.  

## 2021-05-11 DIAGNOSIS — Z950 Presence of cardiac pacemaker: Secondary | ICD-10-CM | POA: Diagnosis not present

## 2021-05-11 DIAGNOSIS — Z45018 Encounter for adjustment and management of other part of cardiac pacemaker: Secondary | ICD-10-CM | POA: Diagnosis not present

## 2021-05-11 DIAGNOSIS — I442 Atrioventricular block, complete: Secondary | ICD-10-CM | POA: Diagnosis not present

## 2021-05-14 ENCOUNTER — Telehealth: Payer: Self-pay

## 2021-05-14 NOTE — Telephone Encounter (Signed)
[name] called wanting to speak to [name] about a physical therapy appt for Krista Clarke. Please Advise.

## 2021-05-18 ENCOUNTER — Telehealth: Payer: Self-pay

## 2021-05-18 NOTE — Telephone Encounter (Signed)
Deborra Medina from [facility] called to speak to [name] about a referral for [employer]. She can be reached at [phone].

## 2021-05-20 ENCOUNTER — Telehealth: Payer: Self-pay | Admitting: Pharmacist

## 2021-05-20 NOTE — Chronic Care Management (AMB) (Addendum)
Chronic Care Management Pharmacy Assistant   Name: SREEYA ROSSITER  MRN: [id number] DOB: 02-21-1941  Krista Clarke is an 80 y.o. year old female who presents for his initial CCM visit with the clinical pharmacist.  Reason for Encounter: Chart Review For Initial Visit With Clinical Pharmacist   Conditions to be addressed/monitored: HTN, DM II, HLD, Pacemaker, Severe Obesity, Vitamin B 12 deficiency, Gout  Primary concerns for visit include: HTN, DM II, HLD  Recent office visits:  03/09/2021 OV (PCP) Leamon Arnt, MD; decrease norco to twice daily as needed, follow up in 3 months for diabetes and back pain.  12/03/2020 OV (PCP) Leamon Arnt, MD; annual exam, no medication changes.  Recent consult visits:  11/25/2020 OV (podiatry) Pain due to onychomycosis, diabetic polyneuropathy, no medication changes indicated.  Hospital visits:  None in previous 6 months  Medications: Outpatient Encounter Medications as of 05/20/2021  Medication Sig   allopurinol (ZYLOPRIM) 100 MG tablet TAKE 1 TABLET BY MOUTH EVERY MORNING   amLODipine (NORVASC) 10 MG tablet TAKE 1 TABLET BY MOUTH EVERY DAY   ASPIRIN LOW DOSE 81 MG chewable tablet CHEW ONE TABLET BY MOUTH EVERY DAY   atorvastatin (LIPITOR) 40 MG tablet Take 1 tablet (40 mg total) by mouth at bedtime.   clopidogrel (PLAVIX) 75 MG tablet Take 75 mg by mouth daily.   diclofenac Sodium (VOLTAREN) 1 % GEL Apply 4 g topically 4 (four) times daily as needed (pain).   FEROSUL 325 (65 Fe) MG tablet TAKE 1 TABLET BY MOUTH 2 TIMES DAILY   furosemide (LASIX) 20 MG tablet TAKE 1 TABLET BY MOUTH EVERY DAY   gabapentin (NEURONTIN) 300 MG capsule TAKE 2 CAPSULES BY MOUTH 3 TIMES DAILY   HYDROcodone-acetaminophen (NORCO/VICODIN) 5-325 MG tablet TAKE 1 TABLET BY MOUTH 2 TIMES DAILY IN THE MORNING AND AT BEDTIME   losartan (COZAAR) 50 MG tablet TAKE 1 TABLET BY MOUTH EVERY DAY   Lutein 20 MG CAPS TAKE 1 CAPSULE BY MOUTH EVERY DAY   magnesium oxide  (MAG-OX) 400 (240 Mg) MG tablet TAKE 1 TABLET BY MOUTH EVERY DAY   meclizine (ANTIVERT) 12.5 MG tablet Take 1 tablet (12.5 mg total) by mouth 2 (two) times daily as needed for dizziness.   metFORMIN (GLUCOPHAGE) 1000 MG tablet TAKE 1 TABLET BY MOUTH 2 TIMES DAILY WITH A MEAL   metoprolol tartrate (LOPRESSOR) 25 MG tablet TAKE 1 TABLET BY MOUTH 2 TIMES DAILY IN THE MORNING AND AT BEDTIME   omeprazole (PRILOSEC) 20 MG capsule TAKE 1 CAPSULE BY MOUTH EVERY DAY BEFORE BREAKFAST   potassium chloride SA (KLOR-CON) 20 MEQ tablet TAKE 1 TABLET BY MOUTH EVERY DAY WITH FUROSEMIDE.   triamcinolone (KENALOG) 0.1 % Apply 1 application topically 2 (two) times daily. For 2 weeks, then as needed   vitamin C (ASCORBIC ACID) 500 MG tablet TAKE 1 TABLET BY MOUTH EVERY DAY   No facility-administered encounter medications on file as of 05/20/2021.   Current Medications: Hydrocodone-acetaminophen  5-325 mg last filled 05/01/2021 30 DS Magnesium Oxide 400 mg last filled 04/27/2021 28 DS Plavix 75 mg last filled 09/16/2020 28 DS - patient unsure if taking Lutein 20 mg caps last filled 04/27/2021 28 DS Vitamin C 500 mg last filled 04/27/2021 28 DS Omeprazole 20 mg last filled 04/27/2021 28 DS Allopurinol 100 mg last filled 04/27/2021 28 DS Gabapentin 300 mg last filled 04/27/2021 28 DS Amlodipine 10 mg last filled 04/27/2021 28 DS Ferosul 325 mg last filled 04/27/2021  28 DS Furosemide 20 mg last filled 04/27/2021 28 DS Triamcinolone 0.1% last filled 10/17/2020 30 DS Metoprolol Tartrate 25 mg last filled 04/27/2021 28 DS Diclofenac Sodium 1% gel last filled 08/27/2020 - uses as needed Atorvastatin 40 mg last filled 04/27/2021 28 DS Meclizine 12.5 mg last filled 08/26/2020 15 DS - takes prn only Potassium Chloride ER 20 mEq last filled 04/27/2021 28 DS Metformin 1000 mg last filled 04/27/2021 28 DS Losartan 50 mg last filled 04/27/2021 28 DS Aspirin 81 mg last filled 04/27/2021 28 DS  Patient Questions:  Any  changes in your medications or health? Patient states she has not had any recently changes in her medications or health.  Any side effects from any medications?  Patient denies any side effects from any of her medications.  Do you have any symptoms or problems not managed by your medications? Patient states she does not currently have any symptoms or problems that are not currently managed by her medications.  Any concerns about your health right now? Patient stated "I don't get around that good. I am supposed to start physical therapy."  Has your provider asked that you check blood pressure, blood sugar, or follow special diet at home? Patient states she checks both blood pressure and blood sugar. She states she follows a diabetic diet as best as she can.  Do you get any type of exercise on a regular basis? Some, she hopes to do more with physical therapy.  Can you think of a goal you would like to reach for your health? Patient stated "I would like to be able to get outside by myself."  Do you have any problems getting your medications? Patient states she doesn't have any problems getting her medications.  Is there anything that you would like to discuss during the appointment?  Patient states she doesn't have anything she would like to discuss.  Please bring medications and supplements to appointment   Care Gaps: Medicare Annual Wellness: last AWV 03/06/2021 Ophthalmology Exam: Next due on 08/30/2021 Zoster Vaccines- Shingrix: Overdue since 09/24/2019 Foot Exam: Next due on 03/09/2022 Hemoglobin A1C: 6.1% on 03/09/2021 Colonoscopy: Aged Out , last completed 06/11/2013 Dexa Scan: Completed Mammogram: Complete on 12/08/2020 HPV Vaccines: Aged Out  Future Appointments  Date Time Provider [place]  05/25/2021  9:00 AM [facility] CCM PHARMACIST [facility]  06/09/2021 10:30 AM Leamon Arnt, MD [facility]  07/13/2021  2:00 PM Adrian Prows, MD [facility] None  03/19/2022 11:00  AM [facility] HEALTH COACH [facility]    Star Rating Drugs: Atorvastatin 40 mg last filled 04/27/2021 28 DS Metformin 1000 mg last filled 04/27/2021 28 DS Losartan 50 mg last filled 04/27/2021 28 DS  April D Calhoun, [place] Pharmacist Assistant [phone]

## 2021-05-21 NOTE — Progress Notes (Signed)
Chronic Care Management Pharmacy Note  05/25/2021 Name:  Krista Clarke MRN:  [id number] DOB:  [birth year]  Summary: Initial PharmD visit.  Meds reviewed, she already uses a pill pack service and they deliver to her house.  Adherence is well monitored.  Does mention some swelling in lower extremities, however this resolves overnight or with feet elevation.  Plans to start PT soon to help her with stairs.  Recommendations/Changes made from today's visit Increase mobility with help of PT  Plan: FU 6 months   Subjective: Krista Clarke is an 80 y.o. year old female who is a primary patient of Leamon Arnt, MD.  The CCM team was consulted for assistance with disease management and care coordination needs.    Engaged with patient by telephone for initial visit in response to provider referral for pharmacy case management and/or care coordination services.   Consent to Services:  The patient was given the following information about Chronic Care Management services today, agreed to services, and gave verbal consent: 1. CCM service includes personalized support from designated clinical staff supervised by the primary care provider, including individualized plan of care and coordination with other care providers 2. 24/7 contact phone numbers for assistance for urgent and routine care needs. 3. Service will only be billed when office clinical staff spend 20 minutes or more in a month to coordinate care. 4. Only one practitioner may furnish and bill the service in a calendar month. 5.The patient may stop CCM services at any time (effective at the end of the month) by phone call to the office staff. 6. The patient will be responsible for cost sharing (co-pay) of up to 20% of the service fee (after annual deductible is met). Patient agreed to services and consent obtained.  Patient Care Team: Leamon Arnt, MD as PCP - General (Family Medicine) Adrian Prows, MD as Consulting Physician  (Cardiology) [facility] [place] as Consulting Physician (Orthopedic Surgery) Melissa Noon, [place] as Referring Physician (Optometry) Bjorn Loser, MD as Consulting Physician (Urology) Gardiner Barefoot, DPM as Consulting Physician (Podiatry) Edythe Clarity, [facility] as Pharmacist (Pharmacist)  Recent office visits:  03/09/2021 [facility] (PCP) Leamon Arnt, MD; decrease norco to twice daily as needed, follow up in 3 months for diabetes and back pain.   12/03/2020 [facility] (PCP) Leamon Arnt, MD; annual exam, no medication changes.   Recent consult visits:  11/25/2020 OV (podiatry) Pain due to onychomycosis, diabetic polyneuropathy, no medication changes indicated.   Hospital visits:  None in previous 6 months   Objective:  Lab Results  Component Value Date   CREATININE 0.97 12/03/2020   BUN 25 (H) 12/03/2020   GFR 54.90 (L) 12/03/2020   GFRNONAA 54 (L) 04/04/2020   GFRAA >60 04/04/2020   NA 139 12/03/2020   K 4.4 12/03/2020   CALCIUM 9.9 12/03/2020   CO2 27 12/03/2020   GLUCOSE 116 (H) 12/03/2020    Lab Results  Component Value Date/Time   HGBA1C 6.1 (A) 03/09/2021 03:11 PM   HGBA1C 6.6 (H) 12/03/2020 10:36 AM   HGBA1C 5.8 (A) 08/26/2020 11:29 AM   HGBA1C 6.7 (H) 03/28/2019 03:10 PM   GFR 54.90 (L) 12/03/2020 10:36 AM   GFR 60.30 01/01/2020 03:35 PM    Last diabetic Eye exam:  Lab Results  Component Value Date/Time   HMDIABEYEEXA No Retinopathy 06/12/2019 12:00 AM    Last diabetic Foot exam: No results found for: HMDIABFOOTEX   Lab Results  Component Value Date  CHOL 126 12/03/2020   HDL 49.90 12/03/2020   LDLCALC 50 12/03/2020   TRIG 127.0 12/03/2020   CHOLHDL 3 12/03/2020    Hepatic Function Latest Ref Rng & Units 12/03/2020 01/01/2020 03/28/2019  Total Protein 6.0 - 8.3 g/dL 7.1 7.4 7.8  Albumin 3.5 - 5.2 g/dL 4.2 4.2 4.7  AST 0 - 37 U/L _0 ALT 0 - 35 U/L _1 Alk Phosphatase 39 - 117 U/L 64 77 62  Total Bilirubin 0.2 - 1.2 mg/dL  0.5 0.3 0.4    Lab Results  Component Value Date/Time   TSH 4.28 03/09/2021 03:33 PM   TSH 4.74 (H) 12/03/2020 10:36 AM   FREET4 0.92 03/09/2021 03:33 PM    CBC Latest Ref Rng & Units 12/03/2020 04/04/2020 02/06/2020  WBC 4.0 - 10.5 K/uL 6.6 7.4 5.6  Hemoglobin 12.0 - 15.0 g/dL 9.6(L) 9.9(L) 10.0(L)  Hematocrit 36.0 - 46.0 % 29.8(L) 33.6(L) 31.2(L)  Platelets 150.0 - 400.0 K/uL 250.0 308 307.0    No results found for: VD25OH  Clinical ASCVD: Yes  The ASCVD Risk score (Arnett DK, et al., 2019) failed to calculate for the following reasons:   The 2019 ASCVD risk score is only valid for ages 26 to 47    Depression screen PHQ 2/9 03/06/2021 12/03/2020 12/20/2019  Decreased Interest 0 0 0  Down, Depressed, Hopeless 0 0 0  PHQ - 2 Score 0 0 0  Altered sleeping - - -  Tired, decreased energy - - -  Change in appetite - - -  Feeling bad or failure about yourself  - - -  Trouble concentrating - - -  Moving slowly or fidgety/restless - - -  Suicidal thoughts - - -  PHQ-9 Score - - -  Difficult doing work/chores - - -  Some recent data might be hidden     Social History   Tobacco Use  Smoking Status Never  Smokeless Tobacco Never   BP Readings from Last 3 Encounters:  03/09/21 (!) 154/80  12/03/20 124/74  10/17/20 134/76   Pulse Readings from Last 3 Encounters:  03/09/21 80  12/03/20 78  10/17/20 71   Wt Readings from Last 3 Encounters:  03/09/21 199 lb 9.6 oz (90.5 kg)  12/03/20 195 lb 3.2 oz (88.5 kg)  10/17/20 198 lb (89.8 kg)   BMI Readings from Last 3 Encounters:  03/09/21 34.26 kg/m  12/03/20 33.51 kg/m  10/17/20 33.99 kg/m    Assessment/Interventions: Review of patient past medical history, allergies, medications, health status, including review of consultants reports, laboratory and other test data, was performed as part of comprehensive evaluation and provision of chronic care management services.   SDOH:  (Social Determinants of Health) assessments and  interventions performed: Yes  Financial Resource Strain: Low Risk    Difficulty of Paying Living Expenses: Not hard at all    SDOH Screenings   Alcohol Screen: Not on file  Depression (PHQ2-9): Low Risk    PHQ-2 Score: 0  Financial Resource Strain: Low Risk    Difficulty of Paying Living Expenses: Not hard at all  Food Insecurity: No Food Insecurity   Worried About Charity fundraiser in the Last Year: Never true   Ran Out of Food in the Last Year: Never true  Housing: Low Risk    Last Housing Risk Score: 0  Physical Activity: Inactive   Days of Exercise per Week: 0 days   Minutes of Exercise per Session: 0 min  Social Connections: Moderately Isolated   Frequency of Communication with Friends and Family: More than three times a week   Frequency of Social Gatherings with Friends and Family: Twice a week   Attends Religious Services: More than 4 times per year   Active Member of [employer] or Organizations: No   Attends Archivist Meetings: Never   Marital Status: Widowed  Stress: No Stress Concern Present   Feeling of Stress : Not at all  Tobacco Use: Low Risk    Smoking Tobacco Use: Never   Smokeless Tobacco Use: Never  Transportation Needs: No Transportation Needs   Lack of Transportation (Medical): No   Lack of Transportation (Non-Medical): No    CCM Care Plan  Allergies  Allergen Reactions   Prednisone Palpitations and Other (See Comments)    Doesn't work well for patient    Medications Reviewed Today     Reviewed by Edythe Clarity, [facility] (Pharmacist) on 05/25/21 at [phone]  Med List Status: <None>   Medication Order Taking? Sig Documenting Provider Last Dose Status Informant  allopurinol (ZYLOPRIM) 100 MG tablet [id number] Yes TAKE 1 TABLET BY MOUTH EVERY MORNING Leamon Arnt, MD Taking Active   amLODipine (NORVASC) 10 MG tablet [id number] Yes TAKE 1 TABLET BY MOUTH EVERY DAY Leamon Arnt, MD Taking Active   ASPIRIN LOW DOSE 81 MG chewable tablet  [id number] Yes CHEW ONE TABLET BY MOUTH EVERY DAY Leamon Arnt, MD Taking Active   atorvastatin (LIPITOR) 40 MG tablet [id number] Yes Take 1 tablet (40 mg total) by mouth at bedtime. Leamon Arnt, MD Taking Active   clopidogrel (PLAVIX) 75 MG tablet [id number] Yes Take 75 mg by mouth daily. [provider] Taking Active   diclofenac Sodium (VOLTAREN) 1 % GEL [id number] Yes Apply 4 g topically 4 (four) times daily as needed (pain). Leamon Arnt, MD Taking Active   FEROSUL 325 [phone] Fe) MG tablet [id number]  TAKE 1 TABLET BY MOUTH 2 TIMES DAILY Leamon Arnt, MD  Active   furosemide (LASIX) 20 MG tablet [id number] Yes TAKE 1 TABLET BY MOUTH EVERY DAY Leamon Arnt, MD Taking Active   gabapentin (NEURONTIN) 300 MG capsule [id number] Yes TAKE 2 CAPSULES BY MOUTH 3 TIMES DAILY Leamon Arnt, MD Taking Active   HYDROcodone-acetaminophen (NORCO/VICODIN) 5-325 MG tablet [id number] Yes TAKE 1 TABLET BY MOUTH 2 TIMES DAILY IN THE MORNING AND AT BEDTIME Marin Olp, MD Taking Active   losartan (COZAAR) 50 MG tablet [id number] Yes TAKE 1 TABLET BY MOUTH EVERY DAY Leamon Arnt, MD Taking Active   Lutein 20 MG CAPS [id number] No TAKE 1 CAPSULE BY MOUTH EVERY DAY  Patient not taking: Reported on 05/25/2021   Leamon Arnt, MD Not Taking Active   magnesium oxide (MAG-OX) 400 (240 Mg) MG tablet [id number] Yes TAKE 1 TABLET BY MOUTH EVERY DAY Leamon Arnt, MD Taking Active   meclizine (ANTIVERT) 12.5 MG tablet [id number] Yes Take 1 tablet (12.5 mg total) by mouth 2 (two) times daily as needed for dizziness. Leamon Arnt, MD Taking Active   metFORMIN (GLUCOPHAGE) 1000 MG tablet [id number] Yes TAKE 1 TABLET BY MOUTH 2 TIMES DAILY WITH A MEAL Leamon Arnt, MD Taking Active   metoprolol tartrate (LOPRESSOR) 25 MG tablet [id number] Yes TAKE 1 TABLET BY MOUTH 2 TIMES DAILY IN THE MORNING AND AT BEDTIME Leamon Arnt, MD Taking Active   omeprazole (PRILOSEC) 20 MG capsule [id number] No TAKE 1  CAPSULE BY  [place] BREAKFAST  Patient not taking: Reported on 05/25/2021   Leamon Arnt, MD Not Taking Active   potassium chloride SA (KLOR-CON) 20 MEQ tablet [id number] Yes TAKE 1 TABLET BY MOUTH EVERY DAY WITH FUROSEMIDE. Leamon Arnt, MD Taking Active   triamcinolone (KENALOG) 0.1 % [id number] Yes Apply 1 application topically 2 (two) times daily. For 2 weeks, then as needed Leamon Arnt, MD Taking Active   vitamin C (ASCORBIC ACID) 500 MG tablet [id number] Yes TAKE 1 TABLET BY MOUTH EVERY DAY Leamon Arnt, MD Taking Active             Patient Active Problem List   Diagnosis Date Noted   Gout 12/03/2020   Chronic venous insufficiency 10/17/2020   Vitamin B12 deficiency 02/06/2020   Lumbar stenosis 10/31/2019   Chronic narcotic dependence ([place]) 07/13/2019   Encounter for care of pacemaker 05/24/2019   Urinary incontinence in female, followed by GYN, did not tolerate pessary 06/27/2018   LBBB (left bundle branch block) 11/18/2016   PAD (peripheral artery disease) ([place]) 11/18/2016   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, CHB 08/20/2015    Immunization History  Administered Date(s) Administered   Fluad Quad(high Dose 65+) 06/19/2019, 05/09/2020   Influenza, High Dose Seasonal PF 06/30/2017, 05/12/2018   PFIZER(Purple Top)SARS-COV-2 Vaccination 10/06/2019, 10/29/2019, 06/13/2020   Pneumococcal Conjugate-13  05/12/2018   Pneumococcal Polysaccharide-23 07/13/2019   Tdap 06/15/2018   Zoster Recombinat (Shingrix) 07/30/2019    Conditions to be addressed/monitored:  HTN, PAD, Type II DM, HLD, Gout  Care Plan : General Pharmacy (Adult)  Updates made by Edythe Clarity, RPH since 05/25/2021 12:00 AM     Problem: HTN, PAD, Type II DM, HLD, Gout   Priority: High  Onset Date: 05/25/2021     Long-Range Goal: Patient-Specific Goal   Start Date: 05/25/2021  Expected End Date: 11/22/2021  This Visit's Progress: On track  Priority: High  Note:   Current Barriers:  Minor swelling in extremities  Pharmacist Clinical Goal(s):  Patient will maintain control of BP and glucose as evidenced by labs/screening  through collaboration with PharmD and provider.   Interventions: 1:1 collaboration with Leamon Arnt, MD regarding development and update of comprehensive plan of care as evidenced by provider attestation and co-signature Inter-disciplinary care team collaboration (see longitudinal plan of care) Comprehensive medication review performed; medication list updated in electronic medical record  Hypertension (BP goal <140/90) -Controlled -Current treatment: Losartan 73m daily Amlodipine 167mdaily Metoprolol tartrate 2520mid -Medications previously tried: HCTZ, spironolactone  -Current home readings: 124/74 was most recent she could remember -Current dietary habits: not much of an appetite -Current exercise habits: minimal now, plans to start PT soon to be able to get up and down steps -Denies hypotensive/hypertensive symptoms -Educated on Daily salt intake goal < 2300 mg; Exercise goal of 150 minutes per week; Importance of home blood pressure monitoring; Symptoms of hypotension and importance of maintaining adequate hydration; -Counseled to monitor BP  at home weekly, document, and provide log at future appointments -Recommended to continue current medication -Patient mentions some  swelling in feet/ankles/kneed - not painful or red and does resolve over night or when she elevates feet.  Hyperlipidemia/PAD: (LDL goal < 70) -Controlled -Current treatment: ASA 56m daily Clopidogrel 759mdaily Atorvastatin 4052maily -Medications previously tried: none noted  -Current dietary patterns: see above -Current exercise habits: see above -Educated on Cholesterol goals;  Benefits of statin for ASCVD risk reduction; Importance of limiting foods high in cholesterol; Exercise goal of 150 minutes per week; -Most recent lipid panel excellent -Recommended to continue current medication  Diabetes (A1c goal <7%) -Controlled -Current medications: Metformin 1000m47mice daily with a meal Gabapentin 300mg41mapsules po tid -Medications previously tried: Januvia  -Current home glucose readings fasting glucose: 120s, patient could not find her logs today post prandial glucose: N/A -Denies hypoglycemic/hyperglycemic symptoms -Educated on A1c and blood sugar goals; Exercise goal of 150 minutes per week; Prevention and management of hypoglycemic episodes; Counseled to check feet daily and get yearly eye exams -Counseled to check feet daily and get yearly eye exams -Recommended to continue current medication - denies any hypoglycemia  Gout (Goal: Control symptoms) -Controlled -Current treatment  Allopurinol 100mg 18mications previously tried: none noted -No issues with gout as of late - continues adherence with meds  -Recommended to continue current medication  Patient Goals/Self-Care Activities Patient will:  - take medications as prescribed check glucose daily, document, and provide at future appointments check blood pressure daily, document, and provide at future appointments  Follow Up Plan: The care management team will reach out to the patient again over the next 180 days.          Medication Assistance: None required.  Patient affirms current coverage meets  needs.  Compliance/Adherence/Medication fill history: Care Gaps: Flu vaccine  Star-Rating Drugs: Metformin 1000mg 0103m/22 28ds Atorvastatin 40mg 0810m22 28ds Losartan 50mg 08/9m2 28ds  Patient's [facility] is:  [facility] [phone] box? Yes - organized by [facility] Pt endorses 100% compliance  We discussed: Benefits of medication synchronization, packaging and delivery as well as enhanced pharmacist oversight with [facility]. Patient decided to: Continue current medication management strategy  Care Plan and Follow Up Patient Decision:  Patient agrees to Care Plan and Follow-up.  Plan: The care management team will reach out to the patient again over the next 180 days.  ChristianBeverly MilchClinical Pharmacist [phone]

## 2021-05-25 ENCOUNTER — Ambulatory Visit (INDEPENDENT_AMBULATORY_CARE_PROVIDER_SITE_OTHER): Payer: Medicare Other | Admitting: Pharmacist

## 2021-05-25 ENCOUNTER — Other Ambulatory Visit: Payer: Self-pay | Admitting: Family Medicine

## 2021-05-25 DIAGNOSIS — E1169 Type 2 diabetes mellitus with other specified complication: Secondary | ICD-10-CM

## 2021-05-25 DIAGNOSIS — K219 Gastro-esophageal reflux disease without esophagitis: Secondary | ICD-10-CM

## 2021-05-25 DIAGNOSIS — E1159 Type 2 diabetes mellitus with other circulatory complications: Secondary | ICD-10-CM

## 2021-05-25 DIAGNOSIS — I739 Peripheral vascular disease, unspecified: Secondary | ICD-10-CM

## 2021-05-25 DIAGNOSIS — I152 Hypertension secondary to endocrine disorders: Secondary | ICD-10-CM

## 2021-05-25 DIAGNOSIS — E1140 Type 2 diabetes mellitus with diabetic neuropathy, unspecified: Secondary | ICD-10-CM

## 2021-05-25 NOTE — Patient Instructions (Addendum)
Visit Information   Goals Addressed             This Visit's Progress    Track and Manage My Blood Pressure-Hypertension       Timeframe:  Long-Range Goal Priority:  High Start Date:  05/25/21                           Expected End Date:   11/22/21                    Follow Up Date 08/24/21    - check blood pressure weekly - choose a place to take my blood pressure (home, clinic or office, retail store) - write blood pressure results in a log or diary    Why is this important?   You won't feel high blood pressure, but it can still hurt your blood vessels.  High blood pressure can cause heart or kidney problems. It can also cause a stroke.  Making lifestyle changes like losing a little weight or eating less salt will help.  Checking your blood pressure at home and at different times of the day can help to control blood pressure.  If the doctor prescribes medicine remember to take it the way the doctor ordered.  Call the office if you cannot afford the medicine or if there are questions about it.     Notes:        Patient Care Plan: General Pharmacy (Adult)     Problem Identified: HTN, PAD, Type II DM, HLD, Gout   Priority: High  Onset Date: 05/25/2021     Long-Range Goal: Patient-Specific Goal   Start Date: 05/25/2021  Expected End Date: 11/22/2021  This Visit's Progress: On track  Priority: High  Note:   Current Barriers:  Minor swelling in extremities  Pharmacist Clinical Goal(s):  Patient will maintain control of BP and glucose as evidenced by labs/screening  through collaboration with PharmD and provider.   Interventions: 1:1 collaboration with Leamon Arnt, MD regarding development and update of comprehensive plan of care as evidenced by provider attestation and co-signature Inter-disciplinary care team collaboration (see longitudinal plan of care) Comprehensive medication review performed; medication list updated in electronic medical  record  Hypertension (BP goal <140/90) -Controlled -Current treatment: Losartan '50mg'$  daily Amlodipine '10mg'$  daily Metoprolol tartrate '25mg'$  bid -Medications previously tried: HCTZ, spironolactone  -Current home readings: 124/74 was most recent she could remember -Current dietary habits: not much of an appetite -Current exercise habits: minimal now, plans to start PT soon to be able to get up and down steps -Denies hypotensive/hypertensive symptoms -Educated on Daily salt intake goal < 2300 mg; Exercise goal of 150 minutes per week; Importance of home blood pressure monitoring; Symptoms of hypotension and importance of maintaining adequate hydration; -Counseled to monitor BP at home weekly, document, and provide log at future appointments -Recommended to continue current medication -Patient mentions some swelling in feet/ankles/kneed - not painful or red and does resolve over night or when she elevates feet.  Hyperlipidemia/PAD: (LDL goal < 70) -Controlled -Current treatment: ASA '81mg'$  daily Clopidogrel '75mg'$  daily Atorvastatin '40mg'$  daily -Medications previously tried: none noted  -Current dietary patterns: see above -Current exercise habits: see above -Educated on Cholesterol goals;  Benefits of statin for ASCVD risk reduction; Importance of limiting foods high in cholesterol; Exercise goal of 150 minutes per week; -Most recent lipid panel excellent -Recommended to continue current medication  Diabetes (A1c goal <7%) -Controlled -  Current medications: Metformin '1000mg'$  twice daily with a meal Gabapentin '300mg'$  2 capsules po tid -Medications previously tried: Januvia  -Current home glucose readings fasting glucose: 120s, patient could not find her logs today post prandial glucose: N/A -Denies hypoglycemic/hyperglycemic symptoms -Educated on A1c and blood sugar goals; Exercise goal of 150 minutes per week; Prevention and management of hypoglycemic episodes; Counseled to check  feet daily and get yearly eye exams -Counseled to check feet daily and get yearly eye exams -Recommended to continue current medication - denies any hypoglycemia  Gout (Goal: Control symptoms) -Controlled -Current treatment  Allopurinol '100mg'$  -Medications previously tried: none noted -No issues with gout as of late - continues adherence with meds  -Recommended to continue current medication  Patient Goals/Self-Care Activities Patient will:  - take medications as prescribed check glucose daily, document, and provide at future appointments check blood pressure daily, document, and provide at future appointments  Follow Up Plan: The care management team will reach out to the patient again over the next 180 days.         Krista Clarke was given information about Chronic Care Management services today including:  CCM service includes personalized support from designated clinical staff supervised by her physician, including individualized plan of care and coordination with other care providers 24/7 contact phone numbers for assistance for urgent and routine care needs. Standard insurance, coinsurance, copays and deductibles apply for chronic care management only during months in which we provide at least 20 minutes of these services. Most insurances cover these services at 100%, however patients may be responsible for any copay, coinsurance and/or deductible if applicable. This service may help you avoid the need for more expensive face-to-face services. Only one practitioner may furnish and bill the service in a calendar month. The patient may stop CCM services at any time (effective at the end of the month) by phone call to the office staff.  Patient agreed to services and verbal consent obtained.   The patient verbalized understanding of instructions, educational materials, and care plan provided today and agreed to receive a mailed copy of patient instructions, educational materials, and  care plan.  Telephone follow up appointment with pharmacy team member scheduled for: 6 months  Krista Clarke, [place]

## 2021-05-29 DIAGNOSIS — E785 Hyperlipidemia, unspecified: Secondary | ICD-10-CM

## 2021-05-29 DIAGNOSIS — E1159 Type 2 diabetes mellitus with other circulatory complications: Secondary | ICD-10-CM | POA: Diagnosis not present

## 2021-05-29 DIAGNOSIS — I152 Hypertension secondary to endocrine disorders: Secondary | ICD-10-CM | POA: Diagnosis not present

## 2021-05-29 DIAGNOSIS — E1140 Type 2 diabetes mellitus with diabetic neuropathy, unspecified: Secondary | ICD-10-CM

## 2021-05-29 DIAGNOSIS — E1169 Type 2 diabetes mellitus with other specified complication: Secondary | ICD-10-CM | POA: Diagnosis not present

## 2021-06-03 ENCOUNTER — Ambulatory Visit: Payer: Medicare Other | Admitting: Podiatry

## 2021-06-03 ENCOUNTER — Other Ambulatory Visit: Payer: Self-pay

## 2021-06-03 DIAGNOSIS — M79674 Pain in right toe(s): Secondary | ICD-10-CM | POA: Diagnosis not present

## 2021-06-03 DIAGNOSIS — B351 Tinea unguium: Secondary | ICD-10-CM | POA: Diagnosis not present

## 2021-06-03 DIAGNOSIS — M79675 Pain in left toe(s): Secondary | ICD-10-CM | POA: Diagnosis not present

## 2021-06-03 DIAGNOSIS — D689 Coagulation defect, unspecified: Secondary | ICD-10-CM

## 2021-06-03 DIAGNOSIS — I739 Peripheral vascular disease, unspecified: Secondary | ICD-10-CM

## 2021-06-03 DIAGNOSIS — E1142 Type 2 diabetes mellitus with diabetic polyneuropathy: Secondary | ICD-10-CM

## 2021-06-03 NOTE — Progress Notes (Signed)
This patient returns to my office for at risk foot care.  This patient requires this care by a professional since this patient will be at risk due to having  Diabetes and PAD.  Patient is taking plavix.  This patient is unable to cut nails herself since the patient cannot reach her nails .  . nails.These nails are painful walking and wearing shoes.  This patient presents for at risk foot care today.  General Appearance  Alert, conversant and in no acute stress.  Vascular  Dorsalis pedis and posterior tibial  pulses are absent bilaterally.  Capillary return is within normal limits  bilaterally. Cold feet  bilaterally.  Neurologic  Senn-Weinstein monofilament wire absent   bilaterally. Muscle power within normal limits bilaterally.  Nails Thick disfigured discolored nails with subungual debris  from hallux to fifth toes bilaterally. No evidence of bacterial infection or drainage bilaterally.  Orthopedic  No limitations of motion  feet .  No crepitus or effusions noted.  No bony pathology or digital deformities noted.  Skin  normotropic skin with no porokeratosis noted bilaterally.  No signs of infections or ulcers noted.     Onychomycosis  Pain in right toes  Pain in left toes  Consent was obtained for treatment procedures.   Mechanical debridement of nails 1-5  bilaterally performed with a nail nipper.  Filed with dremel without incident. No infection or ulcer.     Return office visit  3 months        Told patient to return for periodic foot care and evaluation due to potential at risk complications.   Gardiner Barefoot DPM

## 2021-06-04 DIAGNOSIS — E1169 Type 2 diabetes mellitus with other specified complication: Secondary | ICD-10-CM | POA: Diagnosis not present

## 2021-06-04 DIAGNOSIS — M5441 Lumbago with sciatica, right side: Secondary | ICD-10-CM | POA: Diagnosis not present

## 2021-06-04 DIAGNOSIS — E1151 Type 2 diabetes mellitus with diabetic peripheral angiopathy without gangrene: Secondary | ICD-10-CM | POA: Diagnosis not present

## 2021-06-04 DIAGNOSIS — M5442 Lumbago with sciatica, left side: Secondary | ICD-10-CM | POA: Diagnosis not present

## 2021-06-04 DIAGNOSIS — M48061 Spinal stenosis, lumbar region without neurogenic claudication: Secondary | ICD-10-CM | POA: Diagnosis not present

## 2021-06-04 DIAGNOSIS — E1140 Type 2 diabetes mellitus with diabetic neuropathy, unspecified: Secondary | ICD-10-CM | POA: Diagnosis not present

## 2021-06-05 ENCOUNTER — Telehealth: Payer: Self-pay | Admitting: Pharmacist

## 2021-06-05 NOTE — Progress Notes (Signed)
    Chronic Care Management Pharmacy Assistant   Name: Krista Clarke  MRN: [id number] DOB: Jun 18, 1941  Reason for Encounter: CCM Care Plan  Medications: Outpatient Encounter Medications as of 06/05/2021  Medication Sig   allopurinol (ZYLOPRIM) 100 MG tablet TAKE 1 TABLET BY MOUTH EVERY MORNING   amLODipine (NORVASC) 10 MG tablet TAKE 1 TABLET BY MOUTH EVERY DAY   ASPIRIN LOW DOSE 81 MG chewable tablet CHEW ONE TABLET BY MOUTH EVERY DAY   atorvastatin (LIPITOR) 40 MG tablet Take 1 tablet (40 mg total) by mouth at bedtime.   clopidogrel (PLAVIX) 75 MG tablet Take 75 mg by mouth daily.   diclofenac Sodium (VOLTAREN) 1 % GEL Apply 4 g topically 4 (four) times daily as needed (pain).   FEROSUL 325 (65 Fe) MG tablet TAKE 1 TABLET BY MOUTH 2 TIMES DAILY   furosemide (LASIX) 20 MG tablet TAKE 1 TABLET BY MOUTH EVERY DAY   gabapentin (NEURONTIN) 300 MG capsule TAKE 2 CAPSULES BY MOUTH 3 TIMES DAILY   HYDROcodone-acetaminophen (NORCO/VICODIN) 5-325 MG tablet TAKE 1 TABLET BY MOUTH 2 TIMES DAILY IN THE MORNING AND AT BEDTIME   losartan (COZAAR) 50 MG tablet TAKE 1 TABLET BY MOUTH EVERY DAY   Lutein 20 MG CAPS TAKE 1 CAPSULE BY MOUTH EVERY DAY (Patient not taking: Reported on 05/25/2021)   magnesium oxide (MAG-OX) 400 (240 Mg) MG tablet TAKE 1 TABLET BY MOUTH EVERY DAY   meclizine (ANTIVERT) 12.5 MG tablet Take 1 tablet (12.5 mg total) by mouth 2 (two) times daily as needed for dizziness.   metFORMIN (GLUCOPHAGE) 1000 MG tablet TAKE 1 TABLET BY MOUTH 2 TIMES DAILY WITH A MEAL   metoprolol tartrate (LOPRESSOR) 25 MG tablet TAKE 1 TABLET BY MOUTH 2 TIMES DAILY   omeprazole (PRILOSEC) 20 MG capsule TAKE 1 CAPSULE BY MOUTH EVERY DAY BEFORE BREAKFAST   potassium chloride SA (KLOR-CON) 20 MEQ tablet TAKE 1 TABLET BY MOUTH EVERY DAY WITH furosemide   triamcinolone (KENALOG) 0.1 % Apply 1 application topically 2 (two) times daily. For 2 weeks, then as needed   vitamin C (ASCORBIC ACID) 500 MG tablet TAKE 1  TABLET BY MOUTH EVERY DAY   No facility-administered encounter medications on file as of 06/05/2021.   Reviewed the patients initial visit reinsured it was completed per the pharmacist Leata Mouse request. Printed the CCM care plan. Mailed the patient CCM care plan to their most recent address on file.  Follow-Up:Pharmacist Review  Charlann Lange, [place] Pharmacist Assistant [phone]

## 2021-06-08 ENCOUNTER — Ambulatory Visit: Payer: Medicare Other | Admitting: Family Medicine

## 2021-06-09 ENCOUNTER — Other Ambulatory Visit: Payer: Self-pay

## 2021-06-09 ENCOUNTER — Ambulatory Visit (INDEPENDENT_AMBULATORY_CARE_PROVIDER_SITE_OTHER): Payer: Medicare Other | Admitting: Family Medicine

## 2021-06-09 ENCOUNTER — Encounter: Payer: Self-pay | Admitting: Family Medicine

## 2021-06-09 VITALS — BP 134/70 | HR 71 | Temp 98.1°F | Ht 64.0 in | Wt 204.6 lb

## 2021-06-09 DIAGNOSIS — M5442 Lumbago with sciatica, left side: Secondary | ICD-10-CM

## 2021-06-09 DIAGNOSIS — R413 Other amnesia: Secondary | ICD-10-CM | POA: Diagnosis not present

## 2021-06-09 DIAGNOSIS — M48061 Spinal stenosis, lumbar region without neurogenic claudication: Secondary | ICD-10-CM | POA: Diagnosis not present

## 2021-06-09 DIAGNOSIS — Z23 Encounter for immunization: Secondary | ICD-10-CM | POA: Diagnosis not present

## 2021-06-09 DIAGNOSIS — E1140 Type 2 diabetes mellitus with diabetic neuropathy, unspecified: Secondary | ICD-10-CM | POA: Diagnosis not present

## 2021-06-09 DIAGNOSIS — H6123 Impacted cerumen, bilateral: Secondary | ICD-10-CM

## 2021-06-09 DIAGNOSIS — E1151 Type 2 diabetes mellitus with diabetic peripheral angiopathy without gangrene: Secondary | ICD-10-CM | POA: Diagnosis not present

## 2021-06-09 DIAGNOSIS — E1159 Type 2 diabetes mellitus with other circulatory complications: Secondary | ICD-10-CM

## 2021-06-09 DIAGNOSIS — I872 Venous insufficiency (chronic) (peripheral): Secondary | ICD-10-CM

## 2021-06-09 DIAGNOSIS — M5441 Lumbago with sciatica, right side: Secondary | ICD-10-CM

## 2021-06-09 DIAGNOSIS — R600 Localized edema: Secondary | ICD-10-CM | POA: Diagnosis not present

## 2021-06-09 DIAGNOSIS — I152 Hypertension secondary to endocrine disorders: Secondary | ICD-10-CM

## 2021-06-09 DIAGNOSIS — G8929 Other chronic pain: Secondary | ICD-10-CM

## 2021-06-09 DIAGNOSIS — E1169 Type 2 diabetes mellitus with other specified complication: Secondary | ICD-10-CM | POA: Diagnosis not present

## 2021-06-09 LAB — POCT GLYCOSYLATED HEMOGLOBIN (HGB A1C): Hemoglobin A1C: 6.2 % — AB (ref 4.0–5.6)

## 2021-06-09 MED ORDER — FUROSEMIDE 20 MG PO TABS: 40.0000 mg | ORAL_TABLET | Freq: Every day | ORAL | 3 refills | Status: DC

## 2021-06-09 MED ORDER — HYDROCODONE-ACETAMINOPHEN 5-325 MG PO TABS: 1.0000 | ORAL_TABLET | Freq: Two times a day (BID) | ORAL | 0 refills | Status: DC

## 2021-06-09 NOTE — Patient Instructions (Signed)
Please return in 3 months for memory testing. .   You had your flu shot today. Increase your lasix to '40mg'$  daily along with potassium daily. Use compression stockings if possible. Read the information below to help remember things  If you have any questions or concerns, please don't hesitate to send me a message via MyChart or call the office at [phone]. Thank you for visiting with [place] today! It's our pleasure caring for you.   Dementia Dementia is a condition that affects the way the brain functions. It often affects memory and thinking. Usually, dementia gets worse with time and cannot be reversed (progressive dementia). There are many types of dementia, including: Alzheimer's disease. This type is the most common. Vascular dementia. This type may happen as the result of a stroke. Lewy body dementia. This type may happen to people who have Parkinson's disease. Frontotemporal dementia. This type is caused by damage to nerve cells (neurons) in certain parts of the brain. Some people may be affected by more than one type of dementia. This is called mixed dementia. What are the causes? Dementia is caused by damage to cells in the brain. The area of the brain and the types of cells damaged determine the type of dementia. Usually, this damage is irreversible or cannot be undone. Some examples of irreversible causes include: Conditions that affect the blood vessels of the brain, such as diabetes, heart disease, or blood vessel disease. Genetic mutations. In some cases, changes in the brain may be caused by another condition and can be reversed or slowed. Some examples of reversible causes include: Injury to the brain. Certain medicines. Infection, such as meningitis. Metabolic problems, such as vitamin B12 deficiency or thyroid disease. Pressure on the brain, such as from a tumor, blood clot, or too much fluid in the brain (hydrocephalus). Autoimmune diseases that affect the brain or  arteries, such as limbic encephalitis or vasculitis. What are the signs or symptoms? Symptoms of dementia depend on the type of dementia. Common signs of dementia include problems with remembering, thinking, problem solving, decision making, and communicating. These signs develop slowly or get worse with time. This may include: Problems remembering events or people. Having trouble taking a bath or putting clothes on. Forgetting appointments or forgetting to pay bills. Difficulty planning and preparing meals. Having trouble speaking. Getting lost easily. Changes in behavior or mood. How is this diagnosed? This condition is diagnosed by a specialist (neurologist). It is diagnosed based on the history of your symptoms, your medical history, a physical exam, and tests. Tests may include: Tests to evaluate brain function, such as memory tests, cognitive tests, and other tests. Lab tests, such as blood or urine tests. Imaging tests, such as a CT scan, a PET scan, or an MRI. Genetic testing. This may be done if other family members have a diagnosis of certain types of dementia. Your health care provider will talk with you and your family, friends, or caregivers about your history and symptoms. How is this treated? Treatment for this condition depends on the cause of the dementia. Progressive dementias, such as Alzheimer's disease, cannot be cured, but there may be treatments that help to manage symptoms. Treatment might involve taking medicines that may help to: Control the dementia. Slow down the progression of the dementia. Manage symptoms. In some cases, treating the cause of your dementia can improve symptoms, reverse symptoms, or slow down how quickly your dementia becomes worse. Your health care provider can direct you to support groups,  organizations, and other health care providers who can help with decisions about your care. Follow these instructions at home: Medicines Take  over-the-counter and prescription medicines only as told by your health care provider. Use a pill organizer or pill reminder to help you manage your medicines. Avoid taking medicines that can affect thinking, such as pain medicines or sleeping medicines. Lifestyle Make healthy lifestyle choices. Be physically active as told by your health care provider. Do not use any products that contain nicotine or tobacco, such as cigarettes, e-cigarettes, and chewing tobacco. If you need help quitting, ask your health care provider. Do not drink alcohol. Practice stress-management techniques when you get stressed. Spend time with other people. Make sure to get quality sleep. These tips can help you get a good night's rest: Avoid napping during the day. Keep your sleeping area dark and cool. Avoid exercising during the few hours before you go to bed. Avoid caffeine products in the evening. Eating and drinking Drink enough fluid to keep your urine pale yellow. Eat a healthy diet. General instructions  Work with your health care provider to determine what you need help with and what your safety needs are. Talk with your health care provider about whether it is safe for you to drive. If you were given a bracelet that identifies you as a person with memory loss or tracks your location, make sure to wear it at all times. Work with your family to make important decisions, such as advance directives, medical power of attorney, or a living will. Keep all follow-up visits. This is important. Where to find more information Alzheimer's Association: [employer]nz National Institute on Aging: DVDEnthusiasts.nl World Health Organization: RoleLink.com.br Contact a health care provider if: You have any new or worsening symptoms. You have problems with choking or swallowing. Get help right away if: You feel depressed or sad, or feel that you want to harm yourself. Your family members become concerned for your  safety. If you ever feel like you may hurt yourself or others, or have thoughts about taking your own life, get help right away. Go to your nearest emergency department or: Call your local emergency services (911 in the [place] Call a suicide crisis helpline, such as the [place] at 630-611-0953. This is open 24 hours a day in the [place] Text the Crisis Text Line at (201)871-4527 (in the [place].). Summary Dementia is a condition that affects the way the brain functions. Dementia often affects memory and thinking. Usually, dementia gets worse with time and cannot be reversed (progressive dementia). Treatment for this condition depends on the cause of the dementia. Work with your health care provider to determine what you need help with and what your safety needs are. Your health care provider can direct you to support groups, organizations, and other health care providers who can help with decisions about your care. This information is not intended to replace advice given to you by your health care provider. Make sure you discuss any questions you have with your health care provider. Document Revised: 12/31/2019 Document Reviewed: 12/31/2019 Elsevier Patient Education  [place]. Management of Memory Problems  There are some general things you can do to help manage your memory problems.  Your memory may not in fact recover, but by using techniques and strategies you will be able to manage your memory difficulties better.  1)  Establish a routine. Try to establish and then stick to a regular routine.  By doing this, you will get  used to what to expect and you will reduce the need to rely on your memory.  Also, try to do things at the same time of day, such as taking your medication or checking your calendar first thing in the morning. Think about think that you can do as a part of a regular routine and make a list.  Then enter them into a daily planner to remind you.  This  will help you establish a routine.  2)  Organize your environment. Organize your environment so that it is uncluttered.  Decrease visual stimulation.  Place everyday items such as keys or cell phone in the same place every day (ie.  Basket next to front door) Use post it notes with a brief message to yourself (ie. Turn off light, lock the door) Use labels to indicate where things go (ie. Which cupboards are for food, dishes, etc.) Keep a notepad and pen by the telephone to take messages  3)  Memory Aids A diary or journal/notebook/daily planner Making a list (shopping list, chore list, to do list that needs to be done) Using an alarm as a reminder (kitchen timer or cell phone alarm) Using cell phone to store information (Notes, Calendar, Reminders) Calendar/White board placed in a prominent position Post-it notes  In order for memory aids to be useful, you need to have good habits.  It's no good remembering to make a note in your journal if you don't remember to look in it.  Try setting aside a certain time of day to look in journal.  4)  Improving mood and managing fatigue. There may be other factors that contribute to memory difficulties.  Factors, such as anxiety, depression and tiredness can affect memory. Regular gentle exercise can help improve your mood and give you more energy. Simple relaxation techniques may help relieve symptoms of anxiety Try to get back to completing activities or hobbies you enjoyed doing in the past. Learn to pace yourself through activities to decrease fatigue. Find out about some local support groups where you can share experiences with others. Try and achieve 7-8 hours of sleep at night.

## 2021-06-09 NOTE — Progress Notes (Signed)
Subjective  CC:  Chief Complaint  Patient presents with   Diabetes   Hypertension   Hyperlipidemia    HPI: Krista Clarke is a 80 y.o. female who presents to the office today for follow up of diabetes and problems listed above in the chief complaint.  Diabetes follow up: Her diabetic control is reported as Unchanged. Doing fine on metformin 1000 bid. She denies exertional CP or SOB or symptomatic hypoglycemia. She denies foot sores or paresthesias.  Leg edema: worsening in spite of lasix 20 daily. No pain, redness or drainage, but skin is taught and shiny. Symmetric. No sob.  Daughter and pt report memory concerns: forgetting where things are placed; will get lost in conversation; forgetting things she once knew. Unrelated to doses of pain meds. +FH of dementia. Ongoing and slowly progressing for months. Reviewed recent AWV: no cog screen was done; in past had MMSE 26/30 and nl 6CIT HTN: controlled on losartan, amlodipine, and metoprolol bid. No cp or palpitaitons. No sob C/o hearingloss; ? Cerumen.  Flu shot today; other imms up to date.   Wt Readings from Last 3 Encounters:  06/09/21 204 lb 9.6 oz (92.8 kg)  03/09/21 199 lb 9.6 oz (90.5 kg)  12/03/20 195 lb 3.2 oz (88.5 kg)    BP Readings from Last 3 Encounters:  06/09/21 134/70  03/09/21 (!) 154/80  12/03/20 124/74    Assessment  1. Type 2 diabetes, controlled, with neuropathy ([place])   2. Hypertension associated with diabetes ([place])   3. Chronic midline low back pain with bilateral sciatica   4. Memory problem   5. Hearing loss due to cerumen impaction, bilateral   6. Localized edema   7. Chronic venous insufficiency   8. Need for immunization against influenza      Plan  Diabetes is currently very well controlled. Continue metformin 1000 bid HTN is controlled: continue amlodipine10, losartan 50 and metoprolol 25 bid.  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 See avs for helpful mgt strategies.  Will check MMSE next visit and start aricept if indicated. Started conversation Cerumen impaction: hard wasx. Debrox and return for irrigation.  Edema: progressive; increase lasix to 40 daily. Continue potassium 20 daily and recheck bmp in 2-4 weeks. Compression stockings encouraged.  Back pain on crhonic bid norco.  Flu shot today  I spent a total of 47 minutes for this patient encounter. Time spent included preparation, face-to-face counseling with the patient and coordination of care, review of chart and records, and documentation of the encounter.  Follow up: 3 months for memory testing. 2-4 weeks for cerumen impaction and recheck bmp Orders Placed This Encounter  Procedures   Flu Vaccine QUAD High Dose(Fluad)   POCT HgB A1C   Meds ordered this encounter  Medications   HYDROcodone-acetaminophen (NORCO/VICODIN) 5-325 MG tablet    Sig: Take 1 tablet by mouth in the morning and at bedtime.    Dispense:  60 tablet    Refill:  0   furosemide (LASIX) 20 MG tablet    Sig: Take 2 tablets (40 mg total) by mouth daily.    Dispense:  90 tablet    Refill:  3       Immunization History  Administered Date(s) Administered   Fluad Quad(high Dose 65+) 06/19/2019, 05/09/2020, 06/09/2021   Influenza, High Dose Seasonal PF 06/30/2017, 05/12/2018   PFIZER(Purple Top)SARS-COV-2 Vaccination 10/06/2019, 10/29/2019, 06/13/2020   Pneumococcal Conjugate-13 05/12/2018   Pneumococcal Polysaccharide-23 07/13/2019   Tdap 06/15/2018  Zoster Recombinat (Shingrix) 07/30/2019    Diabetes Related Lab Review: Lab Results  Component Value Date   HGBA1C 6.2 (A) 06/09/2021   HGBA1C 6.1 (A) 03/09/2021   HGBA1C 6.6 (H) 12/03/2020    No results found for: Derl Barrow Lab Results  Component Value Date   CREATININE 0.97 12/03/2020   BUN 25 (H) 12/03/2020   NA 139 12/03/2020   K 4.4 12/03/2020   CL 101 12/03/2020   CO2 27 12/03/2020   Lab Results  Component Value Date   CHOL 126 12/03/2020    CHOL 142 02/06/2020   CHOL 133 03/28/2019   Lab Results  Component Value Date   HDL 49.90 12/03/2020   HDL 51.80 02/06/2020   HDL 50.80 03/28/2019   Lab Results  Component Value Date   LDLCALC 50 12/03/2020   LDLCALC 61 02/06/2020   LDLCALC 58 03/28/2019   Lab Results  Component Value Date   TRIG 127.0 12/03/2020   TRIG 143.0 02/06/2020   TRIG 120.0 03/28/2019   Lab Results  Component Value Date   CHOLHDL 3 12/03/2020   CHOLHDL 3 02/06/2020   CHOLHDL 3 03/28/2019   No results found for: LDLDIRECT The ASCVD Risk score (Arnett DK, et al., 2019) failed to calculate for the following reasons:   The 2019 ASCVD risk score is only valid for ages 91 to 66 I have reviewed the [place], Fam and Soc history. Patient Active Problem List   Diagnosis Date Noted   Lumbar stenosis 10/31/2019    Priority: 1.    Dr. Marlou Sa 2010 lumbar MRI, multilevel and severe. Failed cymbalta, PT, lyrica and gabapentin. Treated at [facility] with epidural spinal injections    Chronic narcotic dependence ([place]) 07/13/2019    Priority: 1.   PAD (peripheral artery disease) ([place]) 11/18/2016    Priority: 1.    MILD: LE [place] on 04/29/16, mild arterial occlusive disease. Mild right iliac arterial disease and mild left distal popliteal disease. Right ABI 1.06, Left ABI 1.07.    Chronic midline low back pain with bilateral sciatica 10/02/2016    Priority: 1.   Severe obesity (BMI 35.0-35.9 with comorbidity) ([place]) 10/02/2016    Priority: 1.   Hyperlipidemia associated with type 2 diabetes mellitus ([place]) 10/02/2016    Priority: 1.   Hypertension associated with diabetes ([place]) 03/08/2016    Priority: 1.   Type 2 diabetes, controlled, with neuropathy ([place]) 03/08/2016    Priority: 1.   History of complete heart block 08/20/2015    Priority: 1.    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, CHB 08/20/2015     Priority: 1.    Remote dual-chamber pacemaker transmission 05/11/2021: AP 1.5%, VP 99%.  Longevity 5 years.  Lead impedance and thresholds within normal limits.  There was 1 brief atrial tachycardia for 4 seconds.  Normal pacemaker function.    Gout 12/03/2020    Priority: 2.   Chronic venous insufficiency 10/17/2020    Priority: 2.   Urinary incontinence in female, followed by GYN, did not tolerate pessary 06/27/2018    Priority: 2.   LBBB (left bundle branch block) 11/18/2016    Priority: 2.    EKG 07/08/16: Sinus rhythm , rate 80, left axis deviation, left anterior fascicular block, left bundle branch block.    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   Paresthesia  of both lower extremities 10/02/2016    Priority: 2.    Due to lumbar stenosis.neurontin    Chronic disease anemia 03/08/2016    Priority: 2.   Vitamin B12 deficiency 02/06/2020    Priority: 3.   Encounter for care of pacemaker 05/24/2019    Social History: Patient  reports that she has never smoked. She has never used smokeless tobacco. She reports that she does not drink alcohol and does not use drugs.  Review of Systems: Ophthalmic: negative for eye pain, loss of vision or double vision Cardiovascular: negative for chest pain Respiratory: negative for SOB or persistent cough Gastrointestinal: negative for abdominal pain Genitourinary: negative for dysuria or gross hematuria MSK: negative for foot lesions Neurologic: negative for weakness or gait disturbance  Objective  Vitals: BP 134/70   Pulse 71   Temp 98.1 F (36.7 C) (Temporal)   Ht '5\' 4"'$  (1.626 m)   Wt 204 lb 9.6 oz (92.8 kg)   LMP  (LMP Unknown)   SpO2 97%   BMI 35.12 kg/m  General: well appearing, no acute distress  Psych:  Alert and oriented, normal mood and affect HEENT:  TMS: hard cerumen impaction bilaterally Cardiovascular:  Nl S1 and S2, RRR w/ murmur Respiratory:  Good breath sounds bilaterally, CTAB with normal  effort, no rales Gastrointestinal: normal BS, soft, nontender LE: 1+pitting edema bilaterally  MiniCog: abnl clock, nl recall, 3/5 Animal naming: 5    Diabetic education: ongoing education regarding chronic disease management for diabetes was given today. We continue to reinforce the ABC's of diabetic management: A1c (<7 or 8 dependent upon patient), tight blood pressure control, and cholesterol management with goal LDL < 100 minimally. We discuss diet strategies, exercise recommendations, medication options and possible side effects. At each visit, we review recommended immunizations and preventive care recommendations for diabetics and stress that good diabetic control can prevent other problems. See below for this patient's data.   Commons side effects, risks, benefits, and alternatives for medications and treatment plan prescribed today were discussed, and the patient expressed understanding of the given instructions. Patient is instructed to call or message via MyChart if he/she has any questions or concerns regarding our treatment plan. No barriers to understanding were identified. We discussed Red Flag symptoms and signs in detail. Patient expressed understanding regarding what to do in case of urgent or emergency type symptoms.  Medication list was reconciled, printed and provided to the patient in AVS. Patient instructions and summary information was reviewed with the patient as documented in the AVS. This note was prepared with assistance of Dragon voice recognition software. Occasional wrong-word or sound-a-like substitutions may have occurred due to the inherent limitations of voice recognition software  This visit occurred during the SARS-CoV-2 public health emergency.  Safety protocols were in place, including screening questions prior to the visit, additional usage of staff PPE, and extensive cleaning of exam room while observing appropriate contact time as indicated for disinfecting  solutions.

## 2021-06-10 ENCOUNTER — Encounter: Payer: Self-pay | Admitting: Family Medicine

## 2021-06-12 ENCOUNTER — Other Ambulatory Visit: Payer: Self-pay

## 2021-06-12 MED ORDER — FUROSEMIDE 40 MG PO TABS: 40.0000 mg | ORAL_TABLET | Freq: Every day | ORAL | 3 refills | Status: DC

## 2021-06-12 MED ORDER — POTASSIUM CHLORIDE CRYS ER 20 MEQ PO TBCR: EXTENDED_RELEASE_TABLET | ORAL | 3 refills | Status: DC

## 2021-06-12 NOTE — Telephone Encounter (Signed)
Daughter, Krista Clarke has called into the office.  States [facility] does not have script for Potassium and Lasix.    Looks like script for lasix was increased but was printed out.    Daughter states potassium was to have been increased.    Please give [name] a call back at [phone].

## 2021-06-12 NOTE — Telephone Encounter (Signed)
Just to clarify, are we increasing potassium as well or just lasix??

## 2021-06-12 NOTE — Telephone Encounter (Signed)
Sent medications to pharmacy again, daughter is aware of this

## 2021-06-17 DIAGNOSIS — E1140 Type 2 diabetes mellitus with diabetic neuropathy, unspecified: Secondary | ICD-10-CM | POA: Diagnosis not present

## 2021-06-17 DIAGNOSIS — E1169 Type 2 diabetes mellitus with other specified complication: Secondary | ICD-10-CM | POA: Diagnosis not present

## 2021-06-17 DIAGNOSIS — M48061 Spinal stenosis, lumbar region without neurogenic claudication: Secondary | ICD-10-CM | POA: Diagnosis not present

## 2021-06-17 DIAGNOSIS — M5442 Lumbago with sciatica, left side: Secondary | ICD-10-CM | POA: Diagnosis not present

## 2021-06-17 DIAGNOSIS — M5441 Lumbago with sciatica, right side: Secondary | ICD-10-CM | POA: Diagnosis not present

## 2021-06-17 DIAGNOSIS — E1151 Type 2 diabetes mellitus with diabetic peripheral angiopathy without gangrene: Secondary | ICD-10-CM | POA: Diagnosis not present

## 2021-06-19 DIAGNOSIS — E1140 Type 2 diabetes mellitus with diabetic neuropathy, unspecified: Secondary | ICD-10-CM | POA: Diagnosis not present

## 2021-06-19 DIAGNOSIS — M48061 Spinal stenosis, lumbar region without neurogenic claudication: Secondary | ICD-10-CM | POA: Diagnosis not present

## 2021-06-19 DIAGNOSIS — M5441 Lumbago with sciatica, right side: Secondary | ICD-10-CM | POA: Diagnosis not present

## 2021-06-19 DIAGNOSIS — E1169 Type 2 diabetes mellitus with other specified complication: Secondary | ICD-10-CM | POA: Diagnosis not present

## 2021-06-19 DIAGNOSIS — E1151 Type 2 diabetes mellitus with diabetic peripheral angiopathy without gangrene: Secondary | ICD-10-CM | POA: Diagnosis not present

## 2021-06-19 DIAGNOSIS — M5442 Lumbago with sciatica, left side: Secondary | ICD-10-CM | POA: Diagnosis not present

## 2021-06-23 DIAGNOSIS — E1140 Type 2 diabetes mellitus with diabetic neuropathy, unspecified: Secondary | ICD-10-CM | POA: Diagnosis not present

## 2021-06-23 DIAGNOSIS — M48061 Spinal stenosis, lumbar region without neurogenic claudication: Secondary | ICD-10-CM | POA: Diagnosis not present

## 2021-06-23 DIAGNOSIS — E1169 Type 2 diabetes mellitus with other specified complication: Secondary | ICD-10-CM | POA: Diagnosis not present

## 2021-06-23 DIAGNOSIS — E1151 Type 2 diabetes mellitus with diabetic peripheral angiopathy without gangrene: Secondary | ICD-10-CM | POA: Diagnosis not present

## 2021-06-23 DIAGNOSIS — M5442 Lumbago with sciatica, left side: Secondary | ICD-10-CM | POA: Diagnosis not present

## 2021-06-23 DIAGNOSIS — M5441 Lumbago with sciatica, right side: Secondary | ICD-10-CM | POA: Diagnosis not present

## 2021-06-25 DIAGNOSIS — M48061 Spinal stenosis, lumbar region without neurogenic claudication: Secondary | ICD-10-CM | POA: Diagnosis not present

## 2021-06-25 DIAGNOSIS — M5441 Lumbago with sciatica, right side: Secondary | ICD-10-CM | POA: Diagnosis not present

## 2021-06-25 DIAGNOSIS — E1140 Type 2 diabetes mellitus with diabetic neuropathy, unspecified: Secondary | ICD-10-CM | POA: Diagnosis not present

## 2021-06-25 DIAGNOSIS — E1169 Type 2 diabetes mellitus with other specified complication: Secondary | ICD-10-CM | POA: Diagnosis not present

## 2021-06-25 DIAGNOSIS — M5442 Lumbago with sciatica, left side: Secondary | ICD-10-CM | POA: Diagnosis not present

## 2021-06-25 DIAGNOSIS — E1151 Type 2 diabetes mellitus with diabetic peripheral angiopathy without gangrene: Secondary | ICD-10-CM | POA: Diagnosis not present

## 2021-06-30 DIAGNOSIS — M48062 Spinal stenosis, lumbar region with neurogenic claudication: Secondary | ICD-10-CM | POA: Diagnosis not present

## 2021-06-30 DIAGNOSIS — I10 Essential (primary) hypertension: Secondary | ICD-10-CM | POA: Diagnosis not present

## 2021-06-30 DIAGNOSIS — E1151 Type 2 diabetes mellitus with diabetic peripheral angiopathy without gangrene: Secondary | ICD-10-CM | POA: Diagnosis not present

## 2021-06-30 DIAGNOSIS — I872 Venous insufficiency (chronic) (peripheral): Secondary | ICD-10-CM | POA: Diagnosis not present

## 2021-06-30 DIAGNOSIS — E1140 Type 2 diabetes mellitus with diabetic neuropathy, unspecified: Secondary | ICD-10-CM | POA: Diagnosis not present

## 2021-06-30 DIAGNOSIS — E1169 Type 2 diabetes mellitus with other specified complication: Secondary | ICD-10-CM | POA: Diagnosis not present

## 2021-06-30 DIAGNOSIS — E1159 Type 2 diabetes mellitus with other circulatory complications: Secondary | ICD-10-CM | POA: Diagnosis not present

## 2021-06-30 DIAGNOSIS — E785 Hyperlipidemia, unspecified: Secondary | ICD-10-CM | POA: Diagnosis not present

## 2021-07-02 ENCOUNTER — Telehealth: Payer: Self-pay

## 2021-07-02 DIAGNOSIS — I872 Venous insufficiency (chronic) (peripheral): Secondary | ICD-10-CM | POA: Diagnosis not present

## 2021-07-02 DIAGNOSIS — M48062 Spinal stenosis, lumbar region with neurogenic claudication: Secondary | ICD-10-CM | POA: Diagnosis not present

## 2021-07-02 DIAGNOSIS — E1140 Type 2 diabetes mellitus with diabetic neuropathy, unspecified: Secondary | ICD-10-CM | POA: Diagnosis not present

## 2021-07-02 DIAGNOSIS — E785 Hyperlipidemia, unspecified: Secondary | ICD-10-CM | POA: Diagnosis not present

## 2021-07-02 DIAGNOSIS — E1169 Type 2 diabetes mellitus with other specified complication: Secondary | ICD-10-CM | POA: Diagnosis not present

## 2021-07-02 DIAGNOSIS — E1151 Type 2 diabetes mellitus with diabetic peripheral angiopathy without gangrene: Secondary | ICD-10-CM | POA: Diagnosis not present

## 2021-07-02 NOTE — Telephone Encounter (Signed)
Please advise 

## 2021-07-02 NOTE — Telephone Encounter (Signed)
Monique from [facility] called wanting verbal orders to extend physical therapy. Beckie Busing can be reached at [phone]. Please Advise.

## 2021-07-03 NOTE — Telephone Encounter (Signed)
LVM for monique to return call

## 2021-07-06 ENCOUNTER — Other Ambulatory Visit: Payer: Self-pay

## 2021-07-06 ENCOUNTER — Encounter: Payer: Self-pay | Admitting: Family Medicine

## 2021-07-06 ENCOUNTER — Ambulatory Visit (INDEPENDENT_AMBULATORY_CARE_PROVIDER_SITE_OTHER): Payer: Medicare Other | Admitting: Family Medicine

## 2021-07-06 VITALS — BP 128/68 | HR 75 | Temp 97.9°F | Ht 64.0 in | Wt 194.8 lb

## 2021-07-06 DIAGNOSIS — E1140 Type 2 diabetes mellitus with diabetic neuropathy, unspecified: Secondary | ICD-10-CM | POA: Diagnosis not present

## 2021-07-06 DIAGNOSIS — H6123 Impacted cerumen, bilateral: Secondary | ICD-10-CM

## 2021-07-06 DIAGNOSIS — M48062 Spinal stenosis, lumbar region with neurogenic claudication: Secondary | ICD-10-CM | POA: Diagnosis not present

## 2021-07-06 DIAGNOSIS — I872 Venous insufficiency (chronic) (peripheral): Secondary | ICD-10-CM | POA: Diagnosis not present

## 2021-07-06 DIAGNOSIS — E1151 Type 2 diabetes mellitus with diabetic peripheral angiopathy without gangrene: Secondary | ICD-10-CM | POA: Diagnosis not present

## 2021-07-06 DIAGNOSIS — E785 Hyperlipidemia, unspecified: Secondary | ICD-10-CM | POA: Diagnosis not present

## 2021-07-06 DIAGNOSIS — E1169 Type 2 diabetes mellitus with other specified complication: Secondary | ICD-10-CM | POA: Diagnosis not present

## 2021-07-06 NOTE — Telephone Encounter (Signed)
Unable to reach [facility] x2.

## 2021-07-06 NOTE — Patient Instructions (Signed)
Please follow up as scheduled for your next visit with me: 09/14/2021 for ear recheck and memory recheck and diabetes recheck.  If you have any questions or concerns, please don't hesitate to send me a message via MyChart or call the office at [phone]. Thank you for visiting with [place] today! It's our pleasure caring for you.

## 2021-07-06 NOTE — Progress Notes (Signed)
Subjective  CC:  Chief Complaint  Patient presents with   Cerumen Impaction    Bilateral, has improved     HPI: Krista Clarke is a 80 y.o. female who presents to the office today to address the problems listed above in the chief complaint. 80 year old here for follow-up for bilateral cerumen impaction.  Has been using Debrox regularly.  Feels like the wax has moved a little bit.  Hearing is slightly improved.  No pain Assessment  1. Hearing loss due to cerumen impaction, bilateral      Plan  Bilateral cerumen impaction:  resolved on left; but hard wax on right. Pt elects to continue with debrox at home and return to try again in future. Declines ENT referral.   Follow up: as scheduled for DM, memory and ear recheck  09/14/2021  No orders of the defined types were placed in this encounter.  No orders of the defined types were placed in this encounter.     I reviewed the patients updated PMH, FH, and SocHx.    Patient Active Problem List   Diagnosis Date Noted   Lumbar stenosis 10/31/2019    Priority: High   Chronic narcotic dependence ([place]) 07/13/2019    Priority: High   PAD (peripheral artery disease) ([place]) 11/18/2016    Priority: High   Chronic midline low back pain with bilateral sciatica 10/02/2016    Priority: High   Severe obesity (BMI 35.0-35.9 with comorbidity) ([place]) 10/02/2016    Priority: High   Hyperlipidemia associated with type 2 diabetes mellitus ([place]) 10/02/2016    Priority: High   Hypertension associated with diabetes ([place]) 03/08/2016    Priority: High   Type 2 diabetes, controlled, with neuropathy ([place]) 03/08/2016    Priority: High   History of complete heart block 08/20/2015    Priority: High   Pacemaker; 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[place]) 10/03/2016    Priority: Medium    Paresthesia of both lower extremities 10/02/2016    Priority: Medium    Chronic disease anemia 03/08/2016    Priority: Medium    Vitamin B12 deficiency 02/06/2020    Priority: Low   Encounter for care of pacemaker 05/24/2019   Current Meds  Medication Sig   allopurinol (ZYLOPRIM) 100 MG tablet TAKE 1 TABLET BY MOUTH EVERY MORNING   amLODipine (NORVASC) 10 MG tablet TAKE 1 TABLET BY MOUTH EVERY DAY   ASPIRIN LOW DOSE 81 MG chewable tablet CHEW ONE TABLET BY MOUTH EVERY DAY   atorvastatin (LIPITOR) 40 MG tablet Take 1 tablet (40 mg total) by mouth at bedtime.   clopidogrel (PLAVIX) 75 MG tablet Take 75 mg by mouth daily.   diclofenac Sodium (VOLTAREN) 1 % GEL Apply 4 g topically 4 (four) times daily as needed (pain).   FEROSUL 325 (65 Fe) MG tablet TAKE 1 TABLET BY MOUTH 2 TIMES DAILY   furosemide (LASIX) 40 MG tablet Take 1 tablet (40 mg total) by mouth daily.   gabapentin (NEURONTIN) 300 MG capsule TAKE 2 CAPSULES BY MOUTH 3 TIMES  DAILY   HYDROcodone-acetaminophen (NORCO/VICODIN) 5-325 MG tablet Take 1 tablet by mouth in the morning and at bedtime.   losartan (COZAAR) 50 MG tablet TAKE 1 TABLET BY MOUTH EVERY DAY   Lutein 20 MG CAPS TAKE 1 CAPSULE BY MOUTH EVERY DAY   magnesium oxide (MAG-OX) 400 (240 Mg) MG tablet TAKE 1 TABLET BY MOUTH EVERY DAY   meclizine (ANTIVERT) 12.5 MG tablet Take 1 tablet (12.5 mg total) by mouth 2 (two) times daily as needed for dizziness.   metFORMIN (GLUCOPHAGE) 1000 MG tablet TAKE 1 TABLET BY MOUTH 2 TIMES DAILY WITH A MEAL   metoprolol tartrate (LOPRESSOR) 25 MG tablet TAKE 1 TABLET BY MOUTH 2 TIMES DAILY   omeprazole (PRILOSEC) 20 MG capsule TAKE 1 CAPSULE BY MOUTH EVERY DAY BEFORE BREAKFAST   potassium chloride SA (KLOR-CON) 20 MEQ tablet TAKE 1  TABLET BY MOUTH EVERY DAY WITH furosemide   triamcinolone (KENALOG) 0.1 % Apply 1 application topically 2 (two) times daily. For 2 weeks, then as needed   vitamin C (ASCORBIC ACID) 500 MG tablet TAKE 1 TABLET BY MOUTH EVERY DAY    Allergies: Patient is allergic to prednisone. Family History: Patient family history includes Cancer in her brother; Diabetes in her mother; Hypertension in her father and mother. Social History:  Patient  reports that she has never smoked. She has never used smokeless tobacco. She reports that she does not drink alcohol and does not use drugs.  Review of Systems: Constitutional: Negative for fever malaise or anorexia Cardiovascular: negative for chest pain Respiratory: negative for SOB or persistent cough Gastrointestinal: negative for abdominal pain  Objective  Vitals: BP 128/68   Pulse 75   Temp 97.9 F (36.6 C) (Temporal)   Ht 5\' 4"  (1.626 m)   Wt 194 lb 12.8 oz (88.4 kg)   LMP  (LMP Unknown)   SpO2 93%   BMI 33.44 kg/m  General: no acute distress , A&Ox3 HEENT: bilateral cerumen impaction obstructing TMs.  Hard wax:  Procedure: Cerumen Disimpaction The patient had a large amount of cerumen in the external auditory canal(s): bilateral Ear wax softener was used prior to the lavage. Ear lavage was performed on bilateral ear(s) by Vassie Moselle, CMA Curettage by provider was performed in addition. I personally performed manual curetting; I removed a large amount of wax from left ear. Nl TM. Right ear: unsuccessful There were no complications and following the disimpaction the tympanic membrane was visible. Charges to be entered in Charge Capture section   Commons side effects, risks, benefits, and alternatives for medications and treatment plan prescribed today were discussed, and the patient expressed understanding of the given instructions. Patient is instructed to call or message via MyChart if he/she has any questions or concerns regarding our  treatment plan. No barriers to understanding were identified. We discussed Red Flag symptoms and signs in detail. Patient expressed understanding regarding what to do in case of urgent or emergency type symptoms.  Medication list was reconciled, printed and provided to the patient in AVS. Patient instructions and summary information was reviewed with the patient as documented in the AVS. This note was prepared with assistance of Dragon voice recognition software. Occasional wrong-word or sound-a-like substitutions may have occurred due to the inherent limitations of voice recognition software  This visit occurred during the SARS-CoV-2 public health emergency.  Safety protocols were in place, including screening questions prior to the visit, additional usage of staff PPE, and extensive cleaning of exam room while observing appropriate  contact time as indicated for disinfecting solutions.

## 2021-07-07 ENCOUNTER — Ambulatory Visit: Payer: Medicare Other | Admitting: Cardiology

## 2021-07-08 ENCOUNTER — Encounter: Payer: Self-pay | Admitting: Cardiology

## 2021-07-08 ENCOUNTER — Other Ambulatory Visit: Payer: Self-pay

## 2021-07-08 ENCOUNTER — Ambulatory Visit: Payer: Medicare Other | Admitting: Cardiology

## 2021-07-08 VITALS — BP 142/80 | HR 79 | Temp 98.2°F | Resp 16 | Ht 64.0 in | Wt 197.2 lb

## 2021-07-08 DIAGNOSIS — I442 Atrioventricular block, complete: Secondary | ICD-10-CM | POA: Diagnosis not present

## 2021-07-08 DIAGNOSIS — Z950 Presence of cardiac pacemaker: Secondary | ICD-10-CM

## 2021-07-08 DIAGNOSIS — Z45018 Encounter for adjustment and management of other part of cardiac pacemaker: Secondary | ICD-10-CM

## 2021-07-08 NOTE — Progress Notes (Signed)
Carotid artery duplex  Encounter for care of pacemaker  Complete heart block ([place])  Pacemaker; 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  Longevity 5 years.  Lead impedance and thresholds within normal limits.  There was 1 brief atrial tachycardia for 4 seconds.  Normal pacemaker function.  Scheduled  In office pacemaker check 07/08/2021 Single (S)/Dual (D)/BV: D. Presenting ASVP @ 80/min. Pacemaker dependant:  Yes. Underlying CHB. AP 2%, VP 100% AMS Episodes 1, brief AT.  AT/AF burden <1% .  HVR 0  Longevity 5 Years. Magnet rate: >85%. Lead measurements: Stable. Histogram: Low (L)/normal (N)/high (H)  Normal. Patient activity Good.   Observations: Normal pacemaker function. Changes: None.    Adrian Prows, MD, [facility] 07/08/2021, 3:38 PM Office: [phone] Fax: [phone] Pager: [phone]

## 2021-07-09 DIAGNOSIS — I872 Venous insufficiency (chronic) (peripheral): Secondary | ICD-10-CM | POA: Diagnosis not present

## 2021-07-09 DIAGNOSIS — E1169 Type 2 diabetes mellitus with other specified complication: Secondary | ICD-10-CM | POA: Diagnosis not present

## 2021-07-09 DIAGNOSIS — E785 Hyperlipidemia, unspecified: Secondary | ICD-10-CM | POA: Diagnosis not present

## 2021-07-09 DIAGNOSIS — E1151 Type 2 diabetes mellitus with diabetic peripheral angiopathy without gangrene: Secondary | ICD-10-CM | POA: Diagnosis not present

## 2021-07-09 DIAGNOSIS — E1140 Type 2 diabetes mellitus with diabetic neuropathy, unspecified: Secondary | ICD-10-CM | POA: Diagnosis not present

## 2021-07-09 DIAGNOSIS — M48062 Spinal stenosis, lumbar region with neurogenic claudication: Secondary | ICD-10-CM | POA: Diagnosis not present

## 2021-07-13 ENCOUNTER — Ambulatory Visit: Payer: Medicare Other | Admitting: Cardiology

## 2021-07-13 DIAGNOSIS — I872 Venous insufficiency (chronic) (peripheral): Secondary | ICD-10-CM | POA: Diagnosis not present

## 2021-07-13 DIAGNOSIS — E1169 Type 2 diabetes mellitus with other specified complication: Secondary | ICD-10-CM | POA: Diagnosis not present

## 2021-07-13 DIAGNOSIS — E1140 Type 2 diabetes mellitus with diabetic neuropathy, unspecified: Secondary | ICD-10-CM | POA: Diagnosis not present

## 2021-07-13 DIAGNOSIS — E785 Hyperlipidemia, unspecified: Secondary | ICD-10-CM | POA: Diagnosis not present

## 2021-07-13 DIAGNOSIS — M48062 Spinal stenosis, lumbar region with neurogenic claudication: Secondary | ICD-10-CM | POA: Diagnosis not present

## 2021-07-13 DIAGNOSIS — E1151 Type 2 diabetes mellitus with diabetic peripheral angiopathy without gangrene: Secondary | ICD-10-CM | POA: Diagnosis not present

## 2021-07-16 DIAGNOSIS — M48062 Spinal stenosis, lumbar region with neurogenic claudication: Secondary | ICD-10-CM | POA: Diagnosis not present

## 2021-07-16 DIAGNOSIS — E785 Hyperlipidemia, unspecified: Secondary | ICD-10-CM | POA: Diagnosis not present

## 2021-07-16 DIAGNOSIS — E1140 Type 2 diabetes mellitus with diabetic neuropathy, unspecified: Secondary | ICD-10-CM | POA: Diagnosis not present

## 2021-07-16 DIAGNOSIS — I872 Venous insufficiency (chronic) (peripheral): Secondary | ICD-10-CM | POA: Diagnosis not present

## 2021-07-16 DIAGNOSIS — E1151 Type 2 diabetes mellitus with diabetic peripheral angiopathy without gangrene: Secondary | ICD-10-CM | POA: Diagnosis not present

## 2021-07-16 DIAGNOSIS — E1169 Type 2 diabetes mellitus with other specified complication: Secondary | ICD-10-CM | POA: Diagnosis not present

## 2021-07-17 ENCOUNTER — Telehealth: Payer: Self-pay

## 2021-07-17 NOTE — Telephone Encounter (Signed)
Pt's daughter called, [name]. [name] called on behalf of her sister Caren Griffins. She stated that Dr Jonni Sanger is filling out FMLA for [place]. [name] would like a call back to discuss a few things. Please Advise.

## 2021-07-20 ENCOUNTER — Other Ambulatory Visit: Payer: Self-pay | Admitting: Family Medicine

## 2021-07-20 NOTE — Telephone Encounter (Signed)
Spoke with [name], refaxed FMLA paperwork

## 2021-07-22 DIAGNOSIS — E1169 Type 2 diabetes mellitus with other specified complication: Secondary | ICD-10-CM | POA: Diagnosis not present

## 2021-07-22 DIAGNOSIS — E1140 Type 2 diabetes mellitus with diabetic neuropathy, unspecified: Secondary | ICD-10-CM | POA: Diagnosis not present

## 2021-07-22 DIAGNOSIS — M48062 Spinal stenosis, lumbar region with neurogenic claudication: Secondary | ICD-10-CM | POA: Diagnosis not present

## 2021-07-22 DIAGNOSIS — E1151 Type 2 diabetes mellitus with diabetic peripheral angiopathy without gangrene: Secondary | ICD-10-CM | POA: Diagnosis not present

## 2021-07-22 DIAGNOSIS — I872 Venous insufficiency (chronic) (peripheral): Secondary | ICD-10-CM | POA: Diagnosis not present

## 2021-07-22 DIAGNOSIS — E785 Hyperlipidemia, unspecified: Secondary | ICD-10-CM | POA: Diagnosis not present

## 2021-07-28 ENCOUNTER — Other Ambulatory Visit: Payer: Self-pay | Admitting: Family Medicine

## 2021-07-28 DIAGNOSIS — E1151 Type 2 diabetes mellitus with diabetic peripheral angiopathy without gangrene: Secondary | ICD-10-CM | POA: Diagnosis not present

## 2021-07-28 DIAGNOSIS — E1140 Type 2 diabetes mellitus with diabetic neuropathy, unspecified: Secondary | ICD-10-CM | POA: Diagnosis not present

## 2021-07-28 DIAGNOSIS — E1169 Type 2 diabetes mellitus with other specified complication: Secondary | ICD-10-CM | POA: Diagnosis not present

## 2021-07-28 DIAGNOSIS — M48062 Spinal stenosis, lumbar region with neurogenic claudication: Secondary | ICD-10-CM | POA: Diagnosis not present

## 2021-07-28 DIAGNOSIS — I872 Venous insufficiency (chronic) (peripheral): Secondary | ICD-10-CM | POA: Diagnosis not present

## 2021-07-28 DIAGNOSIS — E785 Hyperlipidemia, unspecified: Secondary | ICD-10-CM | POA: Diagnosis not present

## 2021-07-30 ENCOUNTER — Telehealth: Payer: Self-pay

## 2021-07-30 DIAGNOSIS — I872 Venous insufficiency (chronic) (peripheral): Secondary | ICD-10-CM | POA: Diagnosis not present

## 2021-07-30 DIAGNOSIS — E1151 Type 2 diabetes mellitus with diabetic peripheral angiopathy without gangrene: Secondary | ICD-10-CM | POA: Diagnosis not present

## 2021-07-30 DIAGNOSIS — E785 Hyperlipidemia, unspecified: Secondary | ICD-10-CM | POA: Diagnosis not present

## 2021-07-30 DIAGNOSIS — E1169 Type 2 diabetes mellitus with other specified complication: Secondary | ICD-10-CM | POA: Diagnosis not present

## 2021-07-30 DIAGNOSIS — E1140 Type 2 diabetes mellitus with diabetic neuropathy, unspecified: Secondary | ICD-10-CM | POA: Diagnosis not present

## 2021-07-30 DIAGNOSIS — M48062 Spinal stenosis, lumbar region with neurogenic claudication: Secondary | ICD-10-CM | POA: Diagnosis not present

## 2021-07-30 NOTE — Telephone Encounter (Signed)
Home Health Verbal Orders  Agency:  [facility]  Caller: Monique  Contact and title  Requesting continuation of PT   Frequency:  twice a week for 4 weeks   HH needs F2F w/in last 30 days .

## 2021-07-31 NOTE — Telephone Encounter (Signed)
Spoke with Krista Clarke, verbals given

## 2021-07-31 NOTE — Telephone Encounter (Signed)
Okay to give verbals?

## 2021-08-03 DIAGNOSIS — E1169 Type 2 diabetes mellitus with other specified complication: Secondary | ICD-10-CM | POA: Diagnosis not present

## 2021-08-03 DIAGNOSIS — I872 Venous insufficiency (chronic) (peripheral): Secondary | ICD-10-CM | POA: Diagnosis not present

## 2021-08-03 DIAGNOSIS — E1140 Type 2 diabetes mellitus with diabetic neuropathy, unspecified: Secondary | ICD-10-CM | POA: Diagnosis not present

## 2021-08-03 DIAGNOSIS — M48062 Spinal stenosis, lumbar region with neurogenic claudication: Secondary | ICD-10-CM | POA: Diagnosis not present

## 2021-08-03 DIAGNOSIS — E1151 Type 2 diabetes mellitus with diabetic peripheral angiopathy without gangrene: Secondary | ICD-10-CM | POA: Diagnosis not present

## 2021-08-03 DIAGNOSIS — E785 Hyperlipidemia, unspecified: Secondary | ICD-10-CM | POA: Diagnosis not present

## 2021-08-05 DIAGNOSIS — E785 Hyperlipidemia, unspecified: Secondary | ICD-10-CM | POA: Diagnosis not present

## 2021-08-05 DIAGNOSIS — I872 Venous insufficiency (chronic) (peripheral): Secondary | ICD-10-CM | POA: Diagnosis not present

## 2021-08-05 DIAGNOSIS — E1151 Type 2 diabetes mellitus with diabetic peripheral angiopathy without gangrene: Secondary | ICD-10-CM | POA: Diagnosis not present

## 2021-08-05 DIAGNOSIS — E1140 Type 2 diabetes mellitus with diabetic neuropathy, unspecified: Secondary | ICD-10-CM | POA: Diagnosis not present

## 2021-08-05 DIAGNOSIS — E1169 Type 2 diabetes mellitus with other specified complication: Secondary | ICD-10-CM | POA: Diagnosis not present

## 2021-08-05 DIAGNOSIS — M48062 Spinal stenosis, lumbar region with neurogenic claudication: Secondary | ICD-10-CM | POA: Diagnosis not present

## 2021-08-10 DIAGNOSIS — E1151 Type 2 diabetes mellitus with diabetic peripheral angiopathy without gangrene: Secondary | ICD-10-CM | POA: Diagnosis not present

## 2021-08-10 DIAGNOSIS — E1140 Type 2 diabetes mellitus with diabetic neuropathy, unspecified: Secondary | ICD-10-CM | POA: Diagnosis not present

## 2021-08-10 DIAGNOSIS — M48062 Spinal stenosis, lumbar region with neurogenic claudication: Secondary | ICD-10-CM | POA: Diagnosis not present

## 2021-08-10 DIAGNOSIS — E1169 Type 2 diabetes mellitus with other specified complication: Secondary | ICD-10-CM | POA: Diagnosis not present

## 2021-08-10 DIAGNOSIS — E785 Hyperlipidemia, unspecified: Secondary | ICD-10-CM | POA: Diagnosis not present

## 2021-08-10 DIAGNOSIS — I872 Venous insufficiency (chronic) (peripheral): Secondary | ICD-10-CM | POA: Diagnosis not present

## 2021-08-11 DIAGNOSIS — I442 Atrioventricular block, complete: Secondary | ICD-10-CM | POA: Diagnosis not present

## 2021-08-11 DIAGNOSIS — Z45018 Encounter for adjustment and management of other part of cardiac pacemaker: Secondary | ICD-10-CM | POA: Diagnosis not present

## 2021-08-12 DIAGNOSIS — M48062 Spinal stenosis, lumbar region with neurogenic claudication: Secondary | ICD-10-CM | POA: Diagnosis not present

## 2021-08-12 DIAGNOSIS — E1169 Type 2 diabetes mellitus with other specified complication: Secondary | ICD-10-CM | POA: Diagnosis not present

## 2021-08-12 DIAGNOSIS — I872 Venous insufficiency (chronic) (peripheral): Secondary | ICD-10-CM | POA: Diagnosis not present

## 2021-08-12 DIAGNOSIS — E785 Hyperlipidemia, unspecified: Secondary | ICD-10-CM | POA: Diagnosis not present

## 2021-08-12 DIAGNOSIS — E1140 Type 2 diabetes mellitus with diabetic neuropathy, unspecified: Secondary | ICD-10-CM | POA: Diagnosis not present

## 2021-08-12 DIAGNOSIS — E1151 Type 2 diabetes mellitus with diabetic peripheral angiopathy without gangrene: Secondary | ICD-10-CM | POA: Diagnosis not present

## 2021-08-14 ENCOUNTER — Telehealth: Payer: Self-pay | Admitting: Pharmacist

## 2021-08-14 NOTE — Chronic Care Management (AMB) (Signed)
Chronic Care Management Pharmacy Assistant   Name: Krista Clarke  MRN: [id number] DOB: 06/15/1941   Reason for Encounter: General Adherence Call    Recent office visits:  07/06/2021 OV (PCP) Krista Arnt, MD; no medication changes indicated.  06/09/2021 OV (PCP) Krista Arnt, MD; Edema: progressive; increase lasix to 40 daily. Continue potassium 20 daily and recheck bmp in 2-4 weeks. Compression stockings encouraged.   Recent consult visits:  07/08/2021 OV (Cardiology) Krista Prows, MD; no medication changes indicated.  06/03/2021 OV (Podiatry) Krista Clarke, DPM; no medication changes indicated.  Hospital visits:  None in previous 6 months  Medications: Outpatient Encounter Medications as of 08/14/2021  Medication Sig   allopurinol (ZYLOPRIM) 100 MG tablet TAKE 1 TABLET BY MOUTH EVERY MORNING   amLODipine (NORVASC) 10 MG tablet TAKE 1 TABLET BY MOUTH EVERY DAY   ASPIRIN LOW DOSE 81 MG chewable tablet CHEW ONE TABLET BY MOUTH EVERY DAY   atorvastatin (LIPITOR) 40 MG tablet Take 1 tablet (40 mg total) by mouth at bedtime.   clopidogrel (PLAVIX) 75 MG tablet Take 75 mg by mouth daily.   diclofenac Sodium (VOLTAREN) 1 % GEL Apply 4 g topically 4 (four) times daily as needed (pain).   FEROSUL 325 (65 Fe) MG tablet TAKE 1 TABLET BY MOUTH 2 TIMES DAILY   furosemide (LASIX) 40 MG tablet Take 1 tablet (40 mg total) by mouth daily.   gabapentin (NEURONTIN) 300 MG capsule TAKE 2 CAPSULES BY MOUTH 3 TIMES DAILY   HYDROcodone-acetaminophen (NORCO/VICODIN) 5-325 MG tablet TAKE 1 TABLET BY MOUTH 2 TIMES DAILY in the morning and at bedtime   losartan (COZAAR) 50 MG tablet TAKE 1 TABLET BY MOUTH EVERY DAY   Lutein 20 MG CAPS TAKE 1 CAPSULE BY MOUTH EVERY DAY   magnesium oxide (MAG-OX) 400 (240 Mg) MG tablet TAKE 1 TABLET BY MOUTH EVERY DAY   meclizine (ANTIVERT) 12.5 MG tablet Take 1 tablet (12.5 mg total) by mouth 2 (two) times daily as needed for dizziness.   metFORMIN (GLUCOPHAGE)  1000 MG tablet TAKE 1 TABLET BY MOUTH 2 TIMES DAILY WITH A MEAL   metoprolol tartrate (LOPRESSOR) 25 MG tablet TAKE 1 TABLET BY MOUTH 2 TIMES DAILY   omeprazole (PRILOSEC) 20 MG capsule TAKE 1 CAPSULE BY MOUTH EVERY DAY BEFORE BREAKFAST   potassium chloride SA (KLOR-CON) 20 MEQ tablet TAKE 1 TABLET BY MOUTH EVERY DAY WITH furosemide   triamcinolone (KENALOG) 0.1 % Apply 1 application topically 2 (two) times daily. For 2 weeks, then as needed   vitamin C (ASCORBIC ACID) 500 MG tablet TAKE 1 TABLET BY MOUTH EVERY DAY   No facility-administered encounter medications on file as of 08/14/2021.   Patient Questions: Have you had any problems recently with your health? Patient states she has had some swelling in her legs. She states she hasn't wore her compression stockings much. She will try to get them on and wear them as much as she can to see if that helps.  Have you had any problems with your pharmacy? Patient states she has not had any problems recently with her pharmacy.  What issues or side effects are you having with your medications? Patient denies having any issues or side effects with any of her medications.  What would you like me to pass along to Krista Clarke, CPP for him to help you with?  Patient does not have anything for me to pass along at this time.  What can we do to  take care of you better? Patient did not have any suggestions.  Care Gaps: Medicare Annual Wellness: Completed 03/06/2021 Ophthalmology Exam: Next due on 08/30/2021 Foot Exam: Next due on 03/09/2022 Hemoglobin A1C: 6.2% on 06/09/2021 Colonoscopy: Completed 06/11/2013 Dexa Scan: Completed Mammogram: Completed 12/09/2020  Future Appointments  Date Time Provider [place]  09/21/2021  3:30 PM Krista Arnt, MD [facility]  10/08/2021  8:30 AM Krista Prows, MD [facility] None  11/30/2021  2:15 PM [facility] CCM PHARMACIST [facility]  12/02/2021  1:45 PM Krista Clarke, DPM [facility]  03/19/2022 11:00 AM  [facility] HEALTH COACH [facility]   Star Rating Drugs: Atorvastatin 40 mg last filled 07/30/2021 28 DS Losartan 50 mg last filled 07/30/2021 28 DS Metformin 1000 mg last filled 07/30/2021 28 DS  Krista Clarke, [place] Pharmacist Assistant [phone]

## 2021-08-17 DIAGNOSIS — E1140 Type 2 diabetes mellitus with diabetic neuropathy, unspecified: Secondary | ICD-10-CM | POA: Diagnosis not present

## 2021-08-17 DIAGNOSIS — I872 Venous insufficiency (chronic) (peripheral): Secondary | ICD-10-CM | POA: Diagnosis not present

## 2021-08-17 DIAGNOSIS — E1169 Type 2 diabetes mellitus with other specified complication: Secondary | ICD-10-CM | POA: Diagnosis not present

## 2021-08-17 DIAGNOSIS — E1151 Type 2 diabetes mellitus with diabetic peripheral angiopathy without gangrene: Secondary | ICD-10-CM | POA: Diagnosis not present

## 2021-08-17 DIAGNOSIS — E785 Hyperlipidemia, unspecified: Secondary | ICD-10-CM | POA: Diagnosis not present

## 2021-08-17 DIAGNOSIS — M48062 Spinal stenosis, lumbar region with neurogenic claudication: Secondary | ICD-10-CM | POA: Diagnosis not present

## 2021-08-27 ENCOUNTER — Telehealth: Payer: Self-pay | Admitting: Family Medicine

## 2021-08-27 DIAGNOSIS — M48062 Spinal stenosis, lumbar region with neurogenic claudication: Secondary | ICD-10-CM | POA: Diagnosis not present

## 2021-08-27 DIAGNOSIS — E1151 Type 2 diabetes mellitus with diabetic peripheral angiopathy without gangrene: Secondary | ICD-10-CM | POA: Diagnosis not present

## 2021-08-27 DIAGNOSIS — E1140 Type 2 diabetes mellitus with diabetic neuropathy, unspecified: Secondary | ICD-10-CM | POA: Diagnosis not present

## 2021-08-27 DIAGNOSIS — I872 Venous insufficiency (chronic) (peripheral): Secondary | ICD-10-CM | POA: Diagnosis not present

## 2021-08-27 DIAGNOSIS — E785 Hyperlipidemia, unspecified: Secondary | ICD-10-CM | POA: Diagnosis not present

## 2021-08-27 DIAGNOSIS — E1169 Type 2 diabetes mellitus with other specified complication: Secondary | ICD-10-CM | POA: Diagnosis not present

## 2021-08-27 NOTE — Telephone Encounter (Signed)
Medication: HYDROcodone-acetaminophen (NORCO/VICODIN) 5-325 MG tablet [[id number]]     Has the patient contacted their pharmacy? No. (If no, request that the patient contact the pharmacy for the refill.) (If yes, when and what did the pharmacy advise?)    Preferred Pharmacy (with phone number or street name): [facility] [address]    Agent: Please be advised that RX refills may take up to 3 business days. We ask that you follow-up with your pharmacy.

## 2021-08-28 MED ORDER — HYDROCODONE-ACETAMINOPHEN 5-325 MG PO TABS: ORAL_TABLET | ORAL | 0 refills | Status: DC

## 2021-08-28 NOTE — Addendum Note (Signed)
Addended by: Vivi Barrack on: 08/28/2021 11:32 AM   Modules accepted: Orders

## 2021-08-29 DIAGNOSIS — E1140 Type 2 diabetes mellitus with diabetic neuropathy, unspecified: Secondary | ICD-10-CM | POA: Diagnosis not present

## 2021-08-29 DIAGNOSIS — E785 Hyperlipidemia, unspecified: Secondary | ICD-10-CM | POA: Diagnosis not present

## 2021-08-29 DIAGNOSIS — M48062 Spinal stenosis, lumbar region with neurogenic claudication: Secondary | ICD-10-CM | POA: Diagnosis not present

## 2021-08-29 DIAGNOSIS — I872 Venous insufficiency (chronic) (peripheral): Secondary | ICD-10-CM | POA: Diagnosis not present

## 2021-08-29 DIAGNOSIS — E1151 Type 2 diabetes mellitus with diabetic peripheral angiopathy without gangrene: Secondary | ICD-10-CM | POA: Diagnosis not present

## 2021-08-29 DIAGNOSIS — E1169 Type 2 diabetes mellitus with other specified complication: Secondary | ICD-10-CM | POA: Diagnosis not present

## 2021-09-14 ENCOUNTER — Ambulatory Visit: Payer: Medicare Other | Admitting: Family Medicine

## 2021-09-14 ENCOUNTER — Other Ambulatory Visit: Payer: Self-pay | Admitting: Family Medicine

## 2021-09-14 DIAGNOSIS — K219 Gastro-esophageal reflux disease without esophagitis: Secondary | ICD-10-CM

## 2021-09-21 ENCOUNTER — Other Ambulatory Visit: Payer: Self-pay

## 2021-09-21 ENCOUNTER — Ambulatory Visit (INDEPENDENT_AMBULATORY_CARE_PROVIDER_SITE_OTHER): Payer: Medicare Other | Admitting: Family Medicine

## 2021-09-21 ENCOUNTER — Encounter: Payer: Self-pay | Admitting: Family Medicine

## 2021-09-21 VITALS — BP 142/58 | Ht 63.0 in | Wt 201.0 lb

## 2021-09-21 DIAGNOSIS — I152 Hypertension secondary to endocrine disorders: Secondary | ICD-10-CM

## 2021-09-21 DIAGNOSIS — F1120 Opioid dependence, uncomplicated: Secondary | ICD-10-CM

## 2021-09-21 DIAGNOSIS — I442 Atrioventricular block, complete: Secondary | ICD-10-CM | POA: Insufficient documentation

## 2021-09-21 DIAGNOSIS — G301 Alzheimer's disease with late onset: Secondary | ICD-10-CM | POA: Diagnosis not present

## 2021-09-21 DIAGNOSIS — I739 Peripheral vascular disease, unspecified: Secondary | ICD-10-CM | POA: Diagnosis not present

## 2021-09-21 DIAGNOSIS — I872 Venous insufficiency (chronic) (peripheral): Secondary | ICD-10-CM

## 2021-09-21 DIAGNOSIS — Z6835 Body mass index (BMI) 35.0-35.9, adult: Secondary | ICD-10-CM

## 2021-09-21 DIAGNOSIS — F02A0 Dementia in other diseases classified elsewhere, mild, without behavioral disturbance, psychotic disturbance, mood disturbance, and anxiety: Secondary | ICD-10-CM | POA: Diagnosis not present

## 2021-09-21 DIAGNOSIS — L4050 Arthropathic psoriasis, unspecified: Secondary | ICD-10-CM

## 2021-09-21 DIAGNOSIS — E1140 Type 2 diabetes mellitus with diabetic neuropathy, unspecified: Secondary | ICD-10-CM | POA: Diagnosis not present

## 2021-09-21 DIAGNOSIS — H6121 Impacted cerumen, right ear: Secondary | ICD-10-CM | POA: Diagnosis not present

## 2021-09-21 DIAGNOSIS — E1159 Type 2 diabetes mellitus with other circulatory complications: Secondary | ICD-10-CM

## 2021-09-21 MED ORDER — DONEPEZIL HCL 5 MG PO TBDP: 5.0000 mg | ORAL_TABLET | Freq: Every day | ORAL | 0 refills | Status: DC

## 2021-09-21 MED ORDER — FUROSEMIDE 40 MG PO TABS: 40.0000 mg | ORAL_TABLET | Freq: Two times a day (BID) | ORAL | 3 refills | Status: DC

## 2021-09-21 MED ORDER — POTASSIUM CHLORIDE CRYS ER 20 MEQ PO TBCR: 20.0000 meq | EXTENDED_RELEASE_TABLET | Freq: Two times a day (BID) | ORAL | 3 refills | Status: DC

## 2021-09-21 MED ORDER — HYDROCODONE-ACETAMINOPHEN 5-325 MG PO TABS: 1.0000 | ORAL_TABLET | Freq: Two times a day (BID) | ORAL | 0 refills | Status: DC

## 2021-09-21 NOTE — Patient Instructions (Addendum)
Please return in 3 months for your annual complete physical; please come fasting. And recheck diabetes and memory  I will release your lab results to you on your MyChart account with further instructions. Please reply with any questions.    If you have any questions or concerns, please don't hesitate to send me a message via MyChart or call the office at [phone]. Thank you for visiting with [place] today! It's our pleasure caring for you.   Dementia Dementia is a condition that affects the way the brain functions. It often affects memory and thinking. Usually, dementia gets worse with time and cannot be reversed (progressive dementia). There are many types of dementia, including: Alzheimer's disease. This type is the most common. Vascular dementia. This type may happen as the result of a stroke. Lewy body dementia. This type may happen to people who have Parkinson's disease. Frontotemporal dementia. This type is caused by damage to nerve cells (neurons) in certain parts of the brain. Some people may be affected by more than one type of dementia. This is called mixed dementia. What are the causes? Dementia is caused by damage to cells in the brain. The area of the brain and the types of cells damaged determine the type of dementia. Usually, this damage is irreversible or cannot be undone. Some examples of irreversible causes include: Conditions that affect the blood vessels of the brain, such as diabetes, heart disease, or blood vessel disease. Genetic mutations. In some cases, changes in the brain may be caused by another condition and can be reversed or slowed. Some examples of reversible causes include: Injury to the brain. Certain medicines. Infection, such as meningitis. Metabolic problems, such as vitamin B12 deficiency or thyroid disease. Pressure on the brain, such as from a tumor, blood clot, or too much fluid in the brain (hydrocephalus). Autoimmune diseases that affect the brain or  arteries, such as limbic encephalitis or vasculitis. What are the signs or symptoms? Symptoms of dementia depend on the type of dementia. Common signs of dementia include problems with remembering, thinking, problem solving, decision making, and communicating. These signs develop slowly or get worse with time. This may include: Problems remembering events or people. Having trouble taking a bath or putting clothes on. Forgetting appointments or forgetting to pay bills. Difficulty planning and preparing meals. Having trouble speaking. Getting lost easily. Changes in behavior or mood. How is this diagnosed? This condition is diagnosed by a specialist (neurologist). It is diagnosed based on the history of your symptoms, your medical history, a physical exam, and tests. Tests may include: Tests to evaluate brain function, such as memory tests, cognitive tests, and other tests. Lab tests, such as blood or urine tests. Imaging tests, such as a CT scan, a PET scan, or an MRI. Genetic testing. This may be done if other family members have a diagnosis of certain types of dementia. Your health care provider will talk with you and your family, friends, or caregivers about your history and symptoms. How is this treated? Treatment for this condition depends on the cause of the dementia. Progressive dementias, such as Alzheimer's disease, cannot be cured, but there may be treatments that help to manage symptoms. Treatment might involve taking medicines that may help to: Control the dementia. Slow down the progression of the dementia. Manage symptoms. In some cases, treating the cause of your dementia can improve symptoms, reverse symptoms, or slow down how quickly your dementia becomes worse. Your health care provider can direct you to support  groups, organizations, and other health care providers who can help with decisions about your care. Follow these instructions at home: Medicines Take  over-the-counter and prescription medicines only as told by your health care provider. Use a pill organizer or pill reminder to help you manage your medicines. Avoid taking medicines that can affect thinking, such as pain medicines or sleeping medicines. Lifestyle Make healthy lifestyle choices. Be physically active as told by your health care provider. Do not use any products that contain nicotine or tobacco, such as cigarettes, e-cigarettes, and chewing tobacco. If you need help quitting, ask your health care provider. Do not drink alcohol. Practice stress-management techniques when you get stressed. Spend time with other people. Make sure to get quality sleep. These tips can help you get a good night's rest: Avoid napping during the day. Keep your sleeping area dark and cool. Avoid exercising during the few hours before you go to bed. Avoid caffeine products in the evening. Eating and drinking Drink enough fluid to keep your urine pale yellow. Eat a healthy diet. General instructions  Work with your health care provider to determine what you need help with and what your safety needs are. Talk with your health care provider about whether it is safe for you to drive. If you were given a bracelet that identifies you as a person with memory loss or tracks your location, make sure to wear it at all times. Work with your family to make important decisions, such as advance directives, medical power of attorney, or a living will. Keep all follow-up visits. This is important. Where to find more information Alzheimer's Association: [employer] National Institute on Aging: DVDEnthusiasts.nl World Health Organization: RoleLink.com.br Contact a health care provider if: You have any new or worsening symptoms. You have problems with choking or swallowing. Get help right away if: You feel depressed or sad, or feel that you want to harm yourself. Your family members become concerned for your  safety. If you ever feel like you may hurt yourself or others, or have thoughts about taking your own life, get help right away. Go to your nearest emergency department or: Call your local emergency services (911 in the [place] Call a suicide crisis helpline, such as the [place] at 725-600-6467 or 988 in the [place]. This is open 24 hours a day in the [place] Text the Crisis Text Line at 779-282-1240 (in the [place].). Summary Dementia is a condition that affects the way the brain functions. Dementia often affects memory and thinking. Usually, dementia gets worse with time and cannot be reversed (progressive dementia). Treatment for this condition depends on the cause of the dementia. Work with your health care provider to determine what you need help with and what your safety needs are. Your health care provider can direct you to support groups, organizations, and other health care providers who can help with decisions about your care. This information is not intended to replace advice given to you by your health care provider. Make sure you discuss any questions you have with your health care provider. Document Revised: 03/11/2021 Document Reviewed: 12/31/2019 Elsevier Patient Education  [place].

## 2021-09-21 NOTE — Progress Notes (Signed)
Subjective  CC:  Chief Complaint  Patient presents with   Hearing Problem    A little better.    Joint Swelling    Bilateral legs, ankle and feet pain. Present for a couple months.     HPI: Krista Clarke is a 81 y.o. female who presents to the office today for follow up of diabetes and problems listed above in the chief complaint.  Diabetes follow up: Her diabetic control is reported as Unchanged. Tolerating met 1000 bid.  She denies exertional CP or SOB or symptomatic hypoglycemia. She denies foot sores or paresthesias.  Leg edema on lasix 40 daily with potassium: no significant change. Had trouble with compression stockings. No skin changes  HTN: Feeling well. Taking medications w/o adverse effects. No symptoms of CHF, angina; no palpitations, sob, cp or lower extremity edema. Compliant with meds.  Cerumen impaction: needs recheck on right. No pain Memory concerns: no longer able to cook for safety. Daughter noting progressive sxs.  Back pain on chronic narcotics.  Wt Readings from Last 3 Encounters:  09/21/21 201 lb (91.2 kg)  07/08/21 197 lb 3.2 oz (89.4 kg)  07/06/21 194 lb 12.8 oz (88.4 kg)    BP Readings from Last 3 Encounters:  09/21/21 (!) 142/58  07/08/21 (!) 142/80  07/06/21 128/68    Assessment  1. Type 2 diabetes, controlled, with neuropathy ([place])   2. Hypertension associated with diabetes ([place])   3. Chronic narcotic dependence ([place])   4. Chronic venous insufficiency   5. Mild late onset Alzheimer's dementia without behavioral disturbance, psychotic disturbance, mood disturbance, or anxiety ([place])   6. Impacted cerumen, right ear      Plan  Diabetes has been well controlled. Check a1c today. Continue metforming bid HTN is controlled on ace Leg edema: try 40 bid with potassium to see if helps. Rec low salt diet. Check renal and lytes today Dementia: mild to moderate: start aricept. Neg labs last year. Monitor. Education given.  Cerumen impaction: restart ear  wax softeners and irrigate again at next visit.    I spent a total of 43 minutes for this patient encounter. Time spent included preparation, face-to-face counseling with the patient and coordination of care, review of chart and records, and documentation of the encounter.  Follow up: 3 mo for cpe and dm and memory recheck. Orders Placed This Encounter  Procedures   Comprehensive metabolic panel   Hemoglobin A1c   Meds ordered this encounter  Medications   donepezil (ARICEPT ODT) 5 MG disintegrating tablet    Sig: Take 1 tablet (5 mg total) by mouth at bedtime.    Dispense:  90 tablet    Refill:  0   furosemide (LASIX) 40 MG tablet    Sig: Take 1 tablet (40 mg total) by mouth 2 (two) times daily.    Dispense:  180 tablet    Refill:  3   potassium chloride SA (KLOR-CON M) 20 MEQ tablet    Sig: Take 1 tablet (20 mEq total) by mouth 2 (two) times daily. Take with furosemide    Dispense:  180 tablet    Refill:  3      Immunization History  Administered Date(s) Administered   Fluad Quad(high Dose 65+) 06/19/2019, 05/09/2020, 06/09/2021   Influenza, High Dose Seasonal PF 06/30/2017, 05/12/2018   PFIZER(Purple Top)SARS-COV-2 Vaccination 10/06/2019, 10/29/2019, 06/13/2020   Pfizer Covid-19 Vaccine Bivalent Booster 74yr & up 06/30/2021   Pneumococcal Conjugate-13 05/12/2018   Pneumococcal Polysaccharide-23 07/13/2019  Tdap 06/15/2018   Zoster Recombinat (Shingrix) 07/30/2019    Diabetes Related Lab Review: Lab Results  Component Value Date   HGBA1C 6.2 (A) 06/09/2021   HGBA1C 6.1 (A) 03/09/2021   HGBA1C 6.6 (H) 12/03/2020    No results found for: Derl Barrow Lab Results  Component Value Date   CREATININE 0.97 12/03/2020   BUN 25 (H) 12/03/2020   NA 139 12/03/2020   K 4.4 12/03/2020   CL 101 12/03/2020   CO2 27 12/03/2020   Lab Results  Component Value Date   CHOL 126 12/03/2020   CHOL 142 02/06/2020   CHOL 133 03/28/2019   Lab Results  Component  Value Date   HDL 49.90 12/03/2020   HDL 51.80 02/06/2020   HDL 50.80 03/28/2019   Lab Results  Component Value Date   LDLCALC 50 12/03/2020   LDLCALC 61 02/06/2020   LDLCALC 58 03/28/2019   Lab Results  Component Value Date   TRIG 127.0 12/03/2020   TRIG 143.0 02/06/2020   TRIG 120.0 03/28/2019   Lab Results  Component Value Date   CHOLHDL 3 12/03/2020   CHOLHDL 3 02/06/2020   CHOLHDL 3 03/28/2019   No results found for: LDLDIRECT The ASCVD Risk score (Arnett DK, et al., 2019) failed to calculate for the following reasons:   The 2019 ASCVD risk score is only valid for ages 20 to 3 I have reviewed the [place], Fam and Soc history. Patient Active Problem List   Diagnosis Date Noted   Lumbar stenosis 10/31/2019    Priority: High    Dr. Marlou Sa 2010 lumbar MRI, multilevel and severe. Failed cymbalta, PT, lyrica and gabapentin. Treated at [facility] with epidural spinal injections    Chronic narcotic dependence ([place]) 07/13/2019    Priority: High   PAD (peripheral artery disease) ([place]) 11/18/2016    Priority: High    MILD: LE [place] on 04/29/16, mild arterial occlusive disease. Mild right iliac arterial disease and mild left distal popliteal disease. Right ABI 1.06, Left ABI 1.07.    Chronic midline low back pain with bilateral sciatica 10/02/2016    Priority: High   Severe obesity (BMI 35.0-35.9 with comorbidity) ([place]) 10/02/2016    Priority: High   Hyperlipidemia associated with type 2 diabetes mellitus ([place]) 10/02/2016    Priority: High   Hypertension associated with diabetes ([place]) 03/08/2016    Priority: High   Type 2 diabetes, controlled, with neuropathy ([place]) 03/08/2016    Priority: High   History of complete heart block 08/20/2015    Priority: High    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, CHB 08/20/2015    Priority: High    Remote dual-chamber pacemaker  transmission 08/11/2021:  AP 2.2%, VP 99%.  Longevity 4 years and 8 months.  Lead impedance and thresholds within normal limits.  Brief mode switches, brief atrial tachycardia.  Scheduled  In office pacemaker check 07/08/2021 Single (S)/Dual (D)/BV: D. Presenting ASVP @ 80/min. Pacemaker dependant:  Yes. Underlying CHB. AP 2%, VP 100% AMS Episodes 1, brief AT.  AT/AF burden <1% .  HVR 0  Longevity 5 Years. Magnet rate: >85%. Lead measurements: Stable. Histogram: Low (L)/normal (N)/high (H)  Normal. Patient activity Good.   Observations: Normal pacemaker function. Changes: None.     Gout 12/03/2020    Priority: Medium    Chronic venous insufficiency 10/17/2020    Priority:  Medium    Urinary incontinence in female, followed by GYN, did not tolerate pessary 06/27/2018    Priority: Medium    LBBB (left bundle branch block) 11/18/2016    Priority: Medium     EKG 07/08/16: Sinus rhythm , rate 80, left axis deviation, left anterior fascicular block, left bundle branch block.    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 She has never used smokeless tobacco. She reports that she does not drink alcohol and does not use drugs.  Review of Systems: Ophthalmic: negative for eye pain, loss of vision or double vision Cardiovascular: negative for chest pain Respiratory: negative for SOB or persistent cough Gastrointestinal: negative for abdominal pain Genitourinary: negative for dysuria or gross hematuria MSK: negative for foot lesions Neurologic: negative for weakness or gait disturbance  Objective   Vitals: BP (!) 142/58    Ht '5\' 3"'  (1.6 m)    Wt 201 lb (91.2 kg)    LMP  (LMP Unknown)    BMI 35.61 kg/m  General: well appearing, no acute distress  Psych:  Alert and oriented, normal mood and affect HEENT:  Normocephalic, atraumatic, moist mucous membranes, supple neck , right cerumen impaction present.  Cardiovascular:  Nl S1 and S2, RRR without murmur, gallop or rub. no edema Respiratory:  Good breath sounds bilaterally, CTAB with normal effort, no rales Gastrointestinal: normal BS, soft, nontender Ext +1 pitting edema bilaterally  6CIT: 18/28 assessed by me today. See scan.   Diabetic education: ongoing education regarding chronic disease management for diabetes was given today. We continue to reinforce the ABC's of diabetic management: A1c (<7 or 8 dependent upon patient), tight blood pressure control, and cholesterol management with goal LDL < 100 minimally. We discuss diet strategies, exercise recommendations, medication options and possible side effects. At each visit, we review recommended immunizations and preventive care recommendations for diabetics and stress that good diabetic control can prevent other problems. See below for this patient's data.   Commons side effects, risks, benefits, and alternatives for medications and treatment plan prescribed today were discussed, and the patient expressed understanding of the given instructions. Patient is instructed to call or message via MyChart if he/she has any questions or concerns regarding our treatment plan. No barriers to understanding were identified. We discussed Red Flag symptoms and signs in detail. Patient expressed understanding regarding what to do in case of urgent or emergency type symptoms.  Medication list was reconciled, printed and provided to the patient in AVS. Patient instructions and summary information was reviewed with the patient as documented in the AVS. This note was prepared with assistance of Dragon voice  recognition software. Occasional wrong-word or sound-a-like substitutions may have occurred due to the inherent limitations of voice recognition software  This visit occurred during the SARS-CoV-2 public health emergency.  Safety protocols were in place, including screening questions prior to the visit, additional usage of staff PPE, and extensive cleaning of exam room while observing appropriate contact time as indicated for disinfecting solutions.

## 2021-09-22 LAB — COMPREHENSIVE METABOLIC PANEL
ALT: 8 U/L (ref 0–35)
AST: 12 U/L (ref 0–37)
Albumin: 4.7 g/dL (ref 3.5–5.2)
Alkaline Phosphatase: 62 U/L (ref 39–117)
BUN: 39 mg/dL — ABNORMAL HIGH (ref 6–23)
CO2: 31 mEq/L (ref 19–32)
Calcium: 9.9 mg/dL (ref 8.4–10.5)
Chloride: 100 mEq/L (ref 96–112)
Creatinine, Ser: 1.26 mg/dL — ABNORMAL HIGH (ref 0.40–1.20)
GFR: 39.88 mL/min — ABNORMAL LOW (ref >60.00)
Glucose, Bld: 127 mg/dL — ABNORMAL HIGH (ref 70–99)
Potassium: 4.7 mEq/L (ref 3.5–5.1)
Sodium: 140 mEq/L (ref 135–145)
Total Bilirubin: 0.4 mg/dL (ref 0.2–1.2)
Total Protein: 8.4 g/dL — ABNORMAL HIGH (ref 6.0–8.3)

## 2021-09-22 LAB — HEMOGLOBIN A1C: Hgb A1c MFr Bld: 6.7 % — ABNORMAL HIGH (ref 4.6–6.5)

## 2021-09-22 NOTE — Progress Notes (Signed)
Please call patient: I have reviewed his/her lab results. Labs are stable. Diabetes is doing fine. No change in medications at this time. Keep hydrated to help with kidneys.  Follow up as directed.

## 2021-09-23 NOTE — Progress Notes (Signed)
LVM for patient to return call. 

## 2021-09-25 ENCOUNTER — Telehealth: Payer: Self-pay

## 2021-09-25 NOTE — Progress Notes (Signed)
Spoke with patient's son, stated she will call back.

## 2021-09-25 NOTE — Telephone Encounter (Signed)
Patient has called stating she missed a call yesterday, 1/26.  I do not see a phone note or note within labs as to a call placed.  I did see that Dr. Jonni Sanger has responded to recent labs.  I have read patient Dr. Tamela Oddi response in regard.  Patient understood.

## 2021-10-05 NOTE — Progress Notes (Signed)
Pt gave verbal understanding.

## 2021-10-08 ENCOUNTER — Ambulatory Visit: Payer: Medicare Other | Admitting: Cardiology

## 2021-10-08 DIAGNOSIS — E119 Type 2 diabetes mellitus without complications: Secondary | ICD-10-CM | POA: Diagnosis not present

## 2021-10-08 DIAGNOSIS — H35372 Puckering of macula, left eye: Secondary | ICD-10-CM | POA: Diagnosis not present

## 2021-10-08 DIAGNOSIS — H2513 Age-related nuclear cataract, bilateral: Secondary | ICD-10-CM | POA: Diagnosis not present

## 2021-10-08 DIAGNOSIS — H52203 Unspecified astigmatism, bilateral: Secondary | ICD-10-CM | POA: Diagnosis not present

## 2021-10-23 ENCOUNTER — Ambulatory Visit: Payer: Medicare Other | Admitting: Cardiology

## 2021-10-29 ENCOUNTER — Other Ambulatory Visit: Payer: Self-pay | Admitting: Family Medicine

## 2021-10-29 DIAGNOSIS — I152 Hypertension secondary to endocrine disorders: Secondary | ICD-10-CM

## 2021-10-29 DIAGNOSIS — E1159 Type 2 diabetes mellitus with other circulatory complications: Secondary | ICD-10-CM

## 2021-10-30 ENCOUNTER — Other Ambulatory Visit: Payer: Self-pay | Admitting: Family Medicine

## 2021-10-30 NOTE — Telephone Encounter (Signed)
Pt requesting refill on Hydrocondone 5-325 mg. Last OV 09/21/2021. ?

## 2021-11-02 ENCOUNTER — Ambulatory Visit: Payer: Medicare Other | Admitting: Cardiology

## 2021-11-02 ENCOUNTER — Encounter: Payer: Self-pay | Admitting: Cardiology

## 2021-11-02 ENCOUNTER — Other Ambulatory Visit: Payer: Self-pay

## 2021-11-02 VITALS — BP 130/62 | HR 72 | Temp 97.2°F | Resp 16 | Ht 63.0 in | Wt 189.8 lb

## 2021-11-02 DIAGNOSIS — I10 Essential (primary) hypertension: Secondary | ICD-10-CM | POA: Diagnosis not present

## 2021-11-02 DIAGNOSIS — R42 Dizziness and giddiness: Secondary | ICD-10-CM

## 2021-11-02 DIAGNOSIS — Z950 Presence of cardiac pacemaker: Secondary | ICD-10-CM | POA: Diagnosis not present

## 2021-11-02 DIAGNOSIS — I442 Atrioventricular block, complete: Secondary | ICD-10-CM | POA: Diagnosis not present

## 2021-11-02 NOTE — Progress Notes (Signed)
Primary Physician/Referring:  Leamon Arnt, MD  Patient ID: Krista Clarke, Clarke    DOB: 03-Jun-1941, 81 y.o.   MRN: [id number]  Chief Complaint  Patient presents with   Heart block   Dizziness    With orthostatics   HPI:    Krista Clarke  is a 81 y.o. Krista Clarke  with  second-degree AV block and complete heart and S/P St. 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  She also has chronic back pain and sciatica and was recommended surgery but family did not want her to have surgery due to her underlying medical comorbidity and age.  She is accompanied by her daughter today.  She presents here for annual visit, accompanied by her daughter.  Patient states that she has been sleeping a lot lately.  Otherwise except for mild decrease in mobility, continued chronic back pain, she has no specific symptoms per se.  She has not had any dyspnea or leg edema, her activity level has markedly decreased.     Past Medical History:  Diagnosis Date   Anemia    takes iron daily   Arthritis    Complete heart block ([place]) 08/20/2015   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     Diabetes mellitus without complication ([place])    Encounter for care of pacemaker 05/24/2019   Hypercholesteremia    Hypertension    Pacemaker; 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 EGMs show AT/VP. There were 0 high ventricular rate episodes detected. Health trends do not demonstrate significant abnormality. Battery longevity is 10.4 - 11.2 years. RA pacing is 1.1 %, RV pacing is >99 %.  Scheduled  In office pacemaker check 07/12/19  Single (S)/Dual (D)/BV: D. Presenting ASVP @ 70/min.    Spinal stenosis of lumbar region at multiple levels 10/31/2019    Dr. Marlou Sa 2010 lumbar MRI, multilevel and severe.    Social History   Tobacco Use   Smoking status: Never   Smokeless tobacco: Never  Substance Use Topics   Alcohol use: No  Marital Status: Widowed   ROS  Review of Systems  Cardiovascular:  Negative for chest pain, dyspnea on exertion and leg swelling.  Objective   Vitals with BMI 11/02/2021 09/21/2021 07/08/2021  Height 5\' 3"  5\' 3"  5\' 4"   Weight 189 lbs 13 oz 201 lbs 197 lbs 3 oz  BMI 33.63 23.55 73.22  Systolic 025 427 062  Diastolic 62 58 80  Pulse 72 - 79    Orthostatic VS for the past 72 hrs (Last 3 readings):  Orthostatic BP Patient Position BP Location Cuff Size Orthostatic Pulse  11/02/21 1413 130/71 Standing Left Arm Large 56  11/02/21 1412 148/65 Sitting Left Arm Large 76  11/02/21 1410 145/59 Supine Left Arm Large 67      Physical Exam Constitutional:      Appearance: She is obese.     Comments: Mildly obese  Neck:     Vascular: No JVD.     Comments: Short neck and difficult to evaluate JVP Cardiovascular:     Rate and Rhythm: Normal rate and regular rhythm.     Pulses:          Carotid pulses are 2+ on the  right side and 2+ on the left side.      Dorsalis pedis pulses are 1+ on the right side and 1+ on the left side.       Posterior tibial pulses are 0 on the right side and 0 on the left side.     Heart sounds: Normal heart sounds. No murmur heard.   No gallop.  Pulmonary:     Effort: Pulmonary effort is normal.     Breath sounds: Normal breath sounds.  Abdominal:     General: Bowel sounds are normal.     Palpations: Abdomen is soft.     Comments: Obese. Pannus present  Musculoskeletal:     Right lower leg: No edema.     Left lower leg: No edema.   Laboratory examination:   Recent Labs    12/03/20 1036 09/21/21 1625  NA 139 140  K 4.4 4.7  CL 101 100  CO2 27 31  GLUCOSE 116* 127*  BUN 25* 39*  CREATININE 0.97 1.26*  CALCIUM 9.9 9.9    CrCl cannot be calculated (Patient's most recent lab  result is older than the maximum 21 days allowed.).  CMP Latest Ref Rng & Units 09/21/2021 12/03/2020 04/04/2020  Glucose 70 - 99 mg/dL 127(H) 116(H) 155(H)  BUN 6 - 23 mg/dL 39(H) 25(H) 23  Creatinine 0.40 - 1.20 mg/dL 1.26(H) 0.97 0.99  Sodium 135 - 145 mEq/L 140 139 138  Potassium 3.5 - 5.1 mEq/L 4.7 4.4 5.1  Chloride 96 - 112 mEq/L 100 101 100  CO2 19 - 32 mEq/L 31 27 27   Calcium 8.4 - 10.5 mg/dL 9.9 9.9 9.6  Total Protein 6.0 - 8.3 g/dL 8.4(H) 7.1 -  Total Bilirubin 0.2 - 1.2 mg/dL 0.4 0.5 -  Alkaline Phos 39 - 117 U/L 62 64 -  AST 0 - 37 U/L 12 12 -  ALT 0 - 35 U/L 8 12 -   CBC Latest Ref Rng & Units 12/03/2020 04/04/2020 02/06/2020  WBC 4.0 - 10.5 K/uL 6.6 7.4 5.6  Hemoglobin 12.0 - 15.0 g/dL 9.6(L) 9.9(L) 10.0(L)  Hematocrit 36.0 - 46.0 % 29.8(L) 33.6(L) 31.2(L)  Platelets 150.0 - 400.0 K/uL 250.0 308 307.0   Lipid Panel Recent Labs    12/03/20 1036  CHOL 126  TRIG 127.0  LDLCALC 50  VLDL 25.4  HDL 49.90  CHOLHDL 3    HEMOGLOBIN A1C Lab Results  Component Value Date   HGBA1C 6.7 (H) 09/21/2021   TSH Recent Labs    12/03/20 1036 03/09/21 1533  TSH 4.74* 4.28    Medications and allergies   Allergies  Allergen Reactions   Prednisone Palpitations and Other (See Comments)    Doesn't work well for patient     Current Outpatient Medications:    allopurinol (ZYLOPRIM) 100 MG tablet, TAKE 1 TABLET BY MOUTH EVERY MORNING, Disp: 90 tablet, Rfl: 3   amLODipine (NORVASC) 10 MG tablet, TAKE 1 TABLET BY MOUTH EVERY DAY, Disp: 90 tablet, Rfl: 3   ASPIRIN LOW DOSE 81 MG chewable tablet, CHEW ONE TABLET BY MOUTH EVERY DAY, Disp: 90 tablet, Rfl: 3   atorvastatin (LIPITOR) 40 MG tablet, TAKE 1 TABLET BY MOUTH AT BEDTIME, Disp: 90 tablet, Rfl: 3   clopidogrel (PLAVIX) 75 MG tablet, Take 75 mg by mouth daily., Disp: , Rfl:    diclofenac Sodium (VOLTAREN) 1 % GEL, Apply 4 GRAMS topically 4 TIMES daily AS NEEDED FOR PAIN, Disp: 400 g, Rfl: 3   donepezil (ARICEPT ODT) 5  MG  disintegrating tablet, Take 1 tablet (5 mg total) by mouth at bedtime., Disp: 90 tablet, Rfl: 0   FEROSUL 325 (65 Fe) MG tablet, TAKE 1 TABLET BY MOUTH 2 TIMES DAILY, Disp: 90 tablet, Rfl: 3   furosemide (LASIX) 40 MG tablet, Take 1 tablet (40 mg total) by mouth 2 (two) times daily., Disp: 180 tablet, Rfl: 3   gabapentin (NEURONTIN) 300 MG capsule, TAKE 2 CAPSULES BY MOUTH 3 TIMES DAILY, Disp: 540 capsule, Rfl: 3   HYDROcodone-acetaminophen (NORCO/VICODIN) 5-325 MG tablet, Take 1 tablet by mouth in the morning and at bedtime., Disp: 60 tablet, Rfl: 0   losartan (COZAAR) 50 MG tablet, TAKE 1 TABLET BY MOUTH EVERY DAY, Disp: 90 tablet, Rfl: 3   Lutein 20 MG CAPS, TAKE 1 CAPSULE BY MOUTH EVERY DAY, Disp: 90 capsule, Rfl: 3   magnesium oxide (MAG-OX) 400 (240 Mg) MG tablet, TAKE 1 TABLET BY MOUTH EVERY DAY, Disp: 28 tablet, Rfl: 3   meclizine (ANTIVERT) 12.5 MG tablet, Take 1 tablet (12.5 mg total) by mouth 2 (two) times daily as needed for dizziness., Disp: 30 tablet, Rfl: 2   metFORMIN (GLUCOPHAGE) 1000 MG tablet, TAKE 1 TABLET BY MOUTH 2 TIMES DAILY WITH A MEAL, Disp: 180 tablet, Rfl: 3   metoprolol tartrate (LOPRESSOR) 25 MG tablet, TAKE 1 TABLET BY MOUTH 2 TIMES DAILY, Disp: 480 tablet, Rfl: 3   omeprazole (PRILOSEC) 20 MG capsule, TAKE 1 CAPSULE BY MOUTH EVERY DAY BEFORE breakfast, Disp: 30 capsule, Rfl: 3   potassium chloride SA (KLOR-CON M) 20 MEQ tablet, Take 1 tablet (20 mEq total) by mouth 2 (two) times daily. Take with furosemide, Disp: 180 tablet, Rfl: 3   triamcinolone (KENALOG) 0.1 %, Apply 1 application topically 2 (two) times daily. For 2 weeks, then as needed, Disp: 45 g, Rfl: 0   vitamin C (ASCORBIC ACID) 500 MG tablet, TAKE 1 TABLET BY MOUTH EVERY DAY, Disp: 90 tablet, Rfl: 3  Radiology:   DG Chest 2 View Result Date: 01/21/2020 1. Low lung volumes with streaky basilar atelectasis. No other acute cardiopulmonary abnormality.  2. Chronic elevation of the right hemidiaphragm.    Cardiac Studies:   Nuclear stress test 07/16/2013:  1. Resting EKG NSR, LBBB. Stress EKG was non diagnostic for ischemia. No ADDITIONAL ST-T changes of ischemia noted with pharmacologic stress testing. Stress symptoms included lightheadedness, headache and chest pressure. Stress terminated due to completion of protocol. 2. The perfusion study demonstrated a very prominent breast attenuation artifact in the inferior, inferoseptal region, large size. Left ventricular ejection fraction was estimated to be 78%. This appearance is low risk study. Please see above for explanation.  Coronary angiogram 08/27/2014:  Normal coronary arteries. Severe tortuosity, suggests hypertensive heart disease. Normal LVEF.  Echocardiogram 12/11/2014:  1. Left ventricle cavity is normal in size. Mild concentric hypertrophy of the left ventricle. Normal global wall motion. Doppler evidence of grade II (pseudonormal) diastolic dysfunction. Calculated EF 63%. 2. Trace mitral regurgitation. Mild calcification of the mitral valve annulus. 3. Trace tricuspid regurgitation. IVC is normal with blunted respiratory response.  Lower Extremity Arterial Duplex 11/20/2016: Mildly decreased bilateral lower extremity ABIs, with the left ABI compatible with mild PAD and the right ABI compatible with borderline PAD, progressed in the interval. Further evaluation could be performed with CTA runoff as clinically indicated.  Device clinic: [facility] [address] - dual chamber PPM - 08/20/2015.   Scheduled  In office pacemaker check 07/08/2021 Single (S)/Dual (D)/BV: D. Presenting ASVP @  80/min. Pacemaker dependant:  Yes. Underlying CHB. AP 2%, VP 100% AMS Episodes 1, brief AT.  AT/AF burden <1% .  HVR 0  Longevity 5 Years. Magnet rate: >85%. Lead measurements: Stable. Histogram: Low (L)/normal (N)/high (H)  Normal. Patient activity Good.   Observations: Normal pacemaker function. Changes: None.   Remote dual-chamber  pacemaker transmission 08/11/2021:  AP 2.2%, VP 99%. Longevity 4 years and 8 months. Lead impedance and thresholds within normal limits. Brief mode switches, brief atrial tachycardia.   EKG: EKG 11/02/2021: Demand atrial pacing at rate of 68 bpm, ventricularly paced rhythm.  No further analysis.   Assessment     ICD-10-CM   1. Essential hypertension  I10 EKG 12-Lead    2. Dizziness  R42     3. Complete heart block (HCC)  I44.2     4. 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, CHB  Z95.0     No orders of the defined types were placed in this encounter.  There are no discontinued medications.   Recommendations:    AASHIKA CARTA  is a 81 y.o. Krista Clarke  with  second-degree AV block and complete heart and S/P St. 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  She also has chronic back pain and sciatica and was recommended surgery but family did not want her to have surgery due to her underlying medical comorbidity and age.  She is presently doing well, dizziness is unrelated to any orthostatic changes today.  She has chronic dizziness.  Pacemaker is functioning normally.  Blood pressure is well controlled.  Her fatigue and sleepiness is related to probably medications, she is also on Vicodin and age is contributing to this as well.  Advised her to see if she could wean herself off of it or take minimum dose of the medications although she does have chronic back pain.  No chest pain, no clinical evidence of heart failure, blood pressure is well controlled.  No changes in the medications were done today, I will see her back in a year or sooner if problems.  She will continue remote monitoring of her pacemaker.  Daughter present and all questions answered.    Adrian Prows, MD, [facility] 11/02/2021, 2:38 PM Office: [phone] Pager:  [phone]

## 2021-11-10 DIAGNOSIS — I442 Atrioventricular block, complete: Secondary | ICD-10-CM | POA: Diagnosis not present

## 2021-11-10 DIAGNOSIS — Z45018 Encounter for adjustment and management of other part of cardiac pacemaker: Secondary | ICD-10-CM | POA: Diagnosis not present

## 2021-11-26 ENCOUNTER — Other Ambulatory Visit: Payer: Self-pay | Admitting: Family Medicine

## 2021-11-26 DIAGNOSIS — I10 Essential (primary) hypertension: Secondary | ICD-10-CM | POA: Diagnosis not present

## 2021-11-26 DIAGNOSIS — E1169 Type 2 diabetes mellitus with other specified complication: Secondary | ICD-10-CM | POA: Diagnosis not present

## 2021-11-26 DIAGNOSIS — R202 Paresthesia of skin: Secondary | ICD-10-CM

## 2021-11-26 DIAGNOSIS — E1159 Type 2 diabetes mellitus with other circulatory complications: Secondary | ICD-10-CM | POA: Diagnosis not present

## 2021-11-26 DIAGNOSIS — E785 Hyperlipidemia, unspecified: Secondary | ICD-10-CM | POA: Diagnosis not present

## 2021-11-30 ENCOUNTER — Telehealth: Payer: Medicare Other

## 2021-12-02 ENCOUNTER — Ambulatory Visit: Payer: Medicare Other

## 2021-12-02 ENCOUNTER — Encounter: Payer: Self-pay | Admitting: Podiatry

## 2021-12-02 ENCOUNTER — Ambulatory Visit (INDEPENDENT_AMBULATORY_CARE_PROVIDER_SITE_OTHER): Payer: Medicare Other | Admitting: Podiatry

## 2021-12-02 DIAGNOSIS — E1142 Type 2 diabetes mellitus with diabetic polyneuropathy: Secondary | ICD-10-CM | POA: Diagnosis not present

## 2021-12-02 DIAGNOSIS — M2141 Flat foot [pes planus] (acquired), right foot: Secondary | ICD-10-CM

## 2021-12-02 DIAGNOSIS — D689 Coagulation defect, unspecified: Secondary | ICD-10-CM

## 2021-12-02 DIAGNOSIS — M79674 Pain in right toe(s): Secondary | ICD-10-CM

## 2021-12-02 DIAGNOSIS — M79675 Pain in left toe(s): Secondary | ICD-10-CM

## 2021-12-02 DIAGNOSIS — B351 Tinea unguium: Secondary | ICD-10-CM | POA: Diagnosis not present

## 2021-12-02 DIAGNOSIS — I739 Peripheral vascular disease, unspecified: Secondary | ICD-10-CM

## 2021-12-02 NOTE — Progress Notes (Signed)
SITUATION ?Reason for Consult: Evaluation for Prefabricated Diabetic Shoes and Custom Diabetic Inserts. ?Patient / Caregiver Report: Patient would like well fitting shoes ? ?OBJECTIVE DATA: ?Patient History / Diagnosis:  ?  ICD-10-CM   ?1. Diabetic polyneuropathy associated with type 2 diabetes mellitus ([place])  E11.42   ?  ?2. Pes planus of both feet  M21.41   ? M21.42   ?  ? ? ?Physician Treating Diabetes:  Leamon Arnt, MD ? ?Current or Previous Devices:   Current user ? ?In-Person Foot Examination: ?Ulcers & Callousing:   None ?Deformities:    Pes planus ?Sensation:    Compromised  ?Shoe Size:     11XW ? ?ORTHOTIC RECOMMENDATION ?Recommended Devices: ?- 1x pair prefabricated PDAC approved diabetic shoes; Patient Selected [employer] 844 Grey Size 11XW ?- 3x pair custom-to-patient PDAC approved vacuum formed diabetic insoles. ? ?GOALS OF SHOES AND INSOLES ?- Reduce shear and pressure ?- Reduce / Prevent callus formation ?- Reduce / Prevent ulceration ?- Protect the fragile healing compromised diabetic foot. ? ?Patient would benefit from diabetic shoes and inserts as patient has diabetes mellitus and the patient has one or more of the following conditions: ?- History of partial or complete amputation of the foot ?- History of previous foot ulceration. ?- History of pre-ulcerative callus ?- Peripheral neuropathy with evidence of callus formation ?- Foot deformity ?- Poor circulation ? ?ACTIONS PERFORMED ?Potential out of pocket cost was communicated to patient. Patient understood and consented to measurement and casting. Patient was casted for insoles via crush box and measured for shoes via brannock device. Procedure was explained and patient tolerated procedure well. All questions were answered and concerns addressed. Casts were shipped to central fabrication for HOLD until Certificate of Medical Necessity or otherwise necessary authorization from insurance is obtained. ? ?PLAN ?Shoes are to be ordered and  casts released from hold once all appropriate paperwork is complete. Patient is to be contacted and scheduled for fitting once shoes and insoles have been fabricated and received. ? ?

## 2021-12-02 NOTE — Progress Notes (Signed)
This patient returns to my office for at risk foot care.  This patient requires this care by a professional since this patient will be at risk due to having  Diabetes and PAD.  Patient is taking plavix.  This patient is unable to cut nails herself since the patient cannot reach her nails . These nails are painful walking and wearing shoes.  This patient presents for at risk foot care today. ? ?General Appearance  Alert, conversant and in no acute stress. ? ?Vascular  Dorsalis pedis and posterior tibial  pulses are absent bilaterally.  Capillary return is within normal limits  bilaterally. Cold feet  bilaterally. ? ?Neurologic  Senn-Weinstein monofilament wire absent   bilaterally. Muscle power within normal limits bilaterally. ? ?Nails Thick disfigured discolored nails with subungual debris  from hallux to fifth toes bilaterally. No evidence of bacterial infection or drainage bilaterally. ? ?Orthopedic  No limitations of motion  feet .  No crepitus or effusions noted.  No bony pathology or digital deformities noted. ? ?Skin  normotropic skin with no porokeratosis noted bilaterally.  No signs of infections or ulcers noted.    ? ?Onychomycosis  Pain in right toes  Pain in left toes ? ?Consent was obtained for treatment procedures.   Mechanical debridement of nails 1-5  bilaterally performed with a nail nipper.  Filed with dremel without incident. No infection or ulcer.  Patient qualifies for diabetic shoes due to DPN, HAV and pes planus. ? ? ?Return office visit  6   months        Told patient to return for periodic foot care and evaluation due to potential at risk complications. ? ? ?Gardiner Barefoot DPM  ?

## 2021-12-14 ENCOUNTER — Other Ambulatory Visit: Payer: Self-pay | Admitting: Family Medicine

## 2021-12-14 DIAGNOSIS — Z1231 Encounter for screening mammogram for malignant neoplasm of breast: Secondary | ICD-10-CM

## 2021-12-19 ENCOUNTER — Other Ambulatory Visit: Payer: Self-pay | Admitting: Family Medicine

## 2021-12-24 ENCOUNTER — Other Ambulatory Visit: Payer: Self-pay | Admitting: Family Medicine

## 2021-12-24 DIAGNOSIS — K219 Gastro-esophageal reflux disease without esophagitis: Secondary | ICD-10-CM

## 2021-12-28 ENCOUNTER — Ambulatory Visit
Admission: RE | Admit: 2021-12-28 | Discharge: 2021-12-28 | Disposition: A | Discharge status: Home / Self Care | Payer: Medicare Other | Source: Ambulatory Visit | Attending: Family Medicine | Admitting: Family Medicine

## 2021-12-28 DIAGNOSIS — Z1231 Encounter for screening mammogram for malignant neoplasm of breast: Secondary | ICD-10-CM

## 2022-01-18 ENCOUNTER — Encounter: Payer: Self-pay | Admitting: Family Medicine

## 2022-01-18 ENCOUNTER — Other Ambulatory Visit (INDEPENDENT_AMBULATORY_CARE_PROVIDER_SITE_OTHER): Payer: Medicare Other

## 2022-01-18 ENCOUNTER — Ambulatory Visit (INDEPENDENT_AMBULATORY_CARE_PROVIDER_SITE_OTHER): Payer: Medicare Other | Admitting: Family Medicine

## 2022-01-18 VITALS — BP 138/54 | HR 81 | Temp 98.3°F | Ht 63.0 in | Wt 190.8 lb

## 2022-01-18 DIAGNOSIS — F1120 Opioid dependence, uncomplicated: Secondary | ICD-10-CM | POA: Diagnosis not present

## 2022-01-18 DIAGNOSIS — E785 Hyperlipidemia, unspecified: Secondary | ICD-10-CM

## 2022-01-18 DIAGNOSIS — I872 Venous insufficiency (chronic) (peripheral): Secondary | ICD-10-CM

## 2022-01-18 DIAGNOSIS — F02A0 Dementia in other diseases classified elsewhere, mild, without behavioral disturbance, psychotic disturbance, mood disturbance, and anxiety: Secondary | ICD-10-CM | POA: Diagnosis not present

## 2022-01-18 DIAGNOSIS — I152 Hypertension secondary to endocrine disorders: Secondary | ICD-10-CM | POA: Diagnosis not present

## 2022-01-18 DIAGNOSIS — M5441 Lumbago with sciatica, right side: Secondary | ICD-10-CM

## 2022-01-18 DIAGNOSIS — E538 Deficiency of other specified B group vitamins: Secondary | ICD-10-CM | POA: Diagnosis not present

## 2022-01-18 DIAGNOSIS — G301 Alzheimer's disease with late onset: Secondary | ICD-10-CM

## 2022-01-18 DIAGNOSIS — E1159 Type 2 diabetes mellitus with other circulatory complications: Secondary | ICD-10-CM

## 2022-01-18 DIAGNOSIS — M109 Gout, unspecified: Secondary | ICD-10-CM

## 2022-01-18 DIAGNOSIS — G8929 Other chronic pain: Secondary | ICD-10-CM

## 2022-01-18 DIAGNOSIS — E1140 Type 2 diabetes mellitus with diabetic neuropathy, unspecified: Secondary | ICD-10-CM

## 2022-01-18 DIAGNOSIS — M5442 Lumbago with sciatica, left side: Secondary | ICD-10-CM

## 2022-01-18 DIAGNOSIS — E1169 Type 2 diabetes mellitus with other specified complication: Secondary | ICD-10-CM

## 2022-01-18 DIAGNOSIS — Z0000 Encounter for general adult medical examination without abnormal findings: Secondary | ICD-10-CM | POA: Diagnosis not present

## 2022-01-18 LAB — HEMOGLOBIN A1C: Hgb A1c MFr Bld: 6.7 % — ABNORMAL HIGH (ref 4.6–6.5)

## 2022-01-18 LAB — COMPREHENSIVE METABOLIC PANEL
ALT: 8 U/L (ref 0–35)
AST: 11 U/L (ref 0–37)
Albumin: 4.4 g/dL (ref 3.5–5.2)
Alkaline Phosphatase: 73 U/L (ref 39–117)
BUN: 52 mg/dL — ABNORMAL HIGH (ref 6–23)
CO2: 25 mEq/L (ref 19–32)
Calcium: 10 mg/dL (ref 8.4–10.5)
Chloride: 100 mEq/L (ref 96–112)
Creatinine, Ser: 1.57 mg/dL — ABNORMAL HIGH (ref 0.40–1.20)
GFR: 30.56 mL/min — ABNORMAL LOW (ref >60.00)
Glucose, Bld: 115 mg/dL — ABNORMAL HIGH (ref 70–99)
Potassium: 5.4 mEq/L — ABNORMAL HIGH (ref 3.5–5.1)
Sodium: 137 mEq/L (ref 135–145)
Total Bilirubin: 0.3 mg/dL (ref 0.2–1.2)
Total Protein: 7.9 g/dL (ref 6.0–8.3)

## 2022-01-18 LAB — LIPID PANEL
Cholesterol: 120 mg/dL (ref 0–200)
HDL: 49.9 mg/dL (ref >39.00)
LDL Cholesterol: 45 mg/dL (ref 0–99)
NonHDL: 69.74
Total CHOL/HDL Ratio: 2
Triglycerides: 124 mg/dL (ref 0.0–149.0)
VLDL: 24.8 mg/dL (ref 0.0–40.0)

## 2022-01-18 LAB — URIC ACID: Uric Acid, Serum: 5.5 mg/dL (ref 2.4–7.0)

## 2022-01-18 LAB — CBC WITH DIFFERENTIAL/PLATELET
Basophils Absolute: 0 10*3/uL (ref 0.0–0.1)
Basophils Relative: 0.7 % (ref 0.0–3.0)
Eosinophils Absolute: 0.1 10*3/uL (ref 0.0–0.7)
Eosinophils Relative: 1.8 % (ref 0.0–5.0)
HCT: 30.5 % — ABNORMAL LOW (ref 36.0–46.0)
Hemoglobin: 9.8 g/dL — ABNORMAL LOW (ref 12.0–15.0)
Lymphocytes Relative: 17.7 % (ref 12.0–46.0)
Lymphs Abs: 1.2 10*3/uL (ref 0.7–4.0)
MCHC: 32.2 g/dL (ref 30.0–36.0)
MCV: 78 fl (ref 78.0–100.0)
Monocytes Absolute: 0.4 10*3/uL (ref 0.1–1.0)
Monocytes Relative: 6.3 % (ref 3.0–12.0)
Neutro Abs: 5.1 10*3/uL (ref 1.4–7.7)
Neutrophils Relative %: 73.5 % (ref 43.0–77.0)
Platelets: 278 10*3/uL (ref 150.0–400.0)
RBC: 3.91 Mil/uL (ref 3.87–5.11)
RDW: 17.1 % — ABNORMAL HIGH (ref 11.5–15.5)
WBC: 6.9 10*3/uL (ref 4.0–10.5)

## 2022-01-18 LAB — MICROALBUMIN / CREATININE URINE RATIO
Creatinine,U: 35.1 mg/dL
Microalb Creat Ratio: 6.7 mg/g (ref 0.0–30.0)
Microalb, Ur: 2.4 mg/dL — ABNORMAL HIGH (ref 0.0–1.9)

## 2022-01-18 LAB — VITAMIN B12: Vitamin B-12: 465 pg/mL (ref 211–911)

## 2022-01-18 MED ORDER — DONEPEZIL HCL 5 MG PO TBDP: 5.0000 mg | ORAL_TABLET | Freq: Every day | ORAL | 3 refills | Status: DC

## 2022-01-18 MED ORDER — HYDROCODONE-ACETAMINOPHEN 5-325 MG PO TABS: ORAL_TABLET | ORAL | 0 refills | Status: DC

## 2022-01-18 NOTE — Addendum Note (Signed)
Addended by: Doran Clay A on: 01/18/2022 10:30 AM   Modules accepted: Orders

## 2022-01-18 NOTE — Patient Instructions (Signed)
Please return in 6 months for recheck.   Call for refills on your pain medications.   If you have any questions or concerns, please don't hesitate to send me a message via MyChart or call the office at [phone]. Thank you for visiting with [place] today! It's our pleasure caring for you.   I will release your lab results to you on your MyChart account with further instructions. You may see the results before I do, but when I review them I will send you a message with my report or have my assistant call you if things need to be discussed. Please reply to my message with any questions. Thank you!

## 2022-01-18 NOTE — Progress Notes (Signed)
Subjective  Chief Complaint  Patient presents with   Annual Exam    Pt here for Annual exam and is nit fasting    HPI: Krista Clarke is a 81 y.o. female who presents to [facility] [place] [facility] today for a Female Wellness Visit. She also has the concerns and/or needs as listed above in the chief complaint. These will be addressed in addition to the Health Maintenance Visit.   Wellness Visit: annual visit with health maintenance review and exam without Pap  Health maintenance: Overall reports she is doing better.  Due for second shingles vaccination.  Otherwise screens are no longer needed or are current. Chronic disease f/u and/or acute problem visit: (deemed necessary to be done in addition to the wellness visit): Type 2 diabetes with neuropathy and possible chronic kidney disease: Due for recheck.  On metformin twice daily.  Reports stable diet.  No symptoms of hypoglycemia.  Eye exam is current. Hypertension and hyperlipidemia have been well controlled.  See below for medications.  Nonfasting for recheck. Started Aricept at last visit.  Patient has not noticed significant changes.  She is here alone today.  No adverse effects reported Chronic back pain and neuropathy on chronic narcotics twice daily.  Again.  Reports pain is fairly well controlled.  Overall she is getting around a little bit more.  Back pain has improved.  No new symptoms. Chronic venous insufficiency now on Lasix 40 twice daily with potassium.  Due for lab recheck.  Is controlling symptoms better. 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  Stable Taking allopurinol for gout.  No recent flares. History of B12 deficiency not on supplements any longer.  Assessment  1. Annual physical exam   2. Type 2 diabetes, controlled, with neuropathy ([place])   3. Hypertension associated with diabetes ([place])   4. Chronic narcotic dependence ([place])   5. Mild late onset Alzheimer's dementia without behavioral disturbance,  psychotic disturbance, mood disturbance, or anxiety ([place])   6. Hyperlipidemia associated with type 2 diabetes mellitus ([place])   7. Chronic midline low back pain with bilateral sciatica   8. Chronic venous insufficiency   9. Vitamin B12 deficiency   10. Gout, unspecified cause, unspecified chronicity, unspecified site      Plan  Female Wellness Visit: Age appropriate Health Maintenance and Prevention measures were discussed with patient. Included topics are cancer screening recommendations, ways to keep healthy (see AVS) including dietary and exercise recommendations, regular eye and dental care, use of seat belts, and avoidance of moderate alcohol use and tobacco use.  BMI: discussed patient's BMI and encouraged positive lifestyle modifications to help get to or maintain a target BMI. HM needs and immunizations were addressed and ordered. See below for orders. See HM and immunization section for updates. Routine labs and screening tests ordered including cmp, cbc and lipids where appropriate. Discussed recommendations regarding Vit D and calcium supplementation (see AVS)  Chronic disease management visit and/or acute problem visit: Diabetes: Recheck A1c.  Has been well controlled on metformin.  Check renal function.  Check urine screen.  Continue statin and ARB.  Metformin 1000 twice daily. Hypertension on losartan 50 and amlodipine 10 daily.  Well-controlled.  Monitoring renal function. Hyperlipidemia: Has been well controlled on atorvastatin 40 mg nightly.  Recheck nonfasting lipid panel today.  Check thyroid. Gout on allopurinol for prophylaxis.  Check uric acid levels. Recheck B12 levels. Continue hydrocodone twice daily for chronic pain.  Well-controlled currently.  No worse effects. Dementia:  Continue Aricept.  Patient reports it is stable  Follow up: 6 months for recheck Orders Placed This Encounter  Procedures   Hemoglobin A1c   CBC with Differential/Platelet   Comp Met (CMET)    Lipid Profile   TSH   Vitamin B12   Uric acid   Microalbumin / creatinine urine ratio   Meds ordered this encounter  Medications   HYDROcodone-acetaminophen (NORCO/VICODIN) 5-325 MG tablet    Sig: TAKE 1 TABLET BY MOUTH EVERY MORNING AND AT BEDTIME    Dispense:  60 tablet    Refill:  0   donepezil (ARICEPT ODT) 5 MG disintegrating tablet    Sig: Take 1 tablet (5 mg total) by mouth at bedtime.    Dispense:  90 tablet    Refill:  3      Body mass index is 33.8 kg/m. Wt Readings from Last 3 Encounters:  01/18/22 190 lb 12.8 oz (86.5 kg)  11/02/21 189 lb 12.8 oz (86.1 kg)  09/21/21 201 lb (91.2 kg)     Patient Active Problem List   Diagnosis Date Noted   Lumbar stenosis 10/31/2019    Priority: High    Dr. Marlou Sa 2010 lumbar MRI, multilevel and severe. Failed cymbalta, PT, lyrica and gabapentin. Treated at [facility] with epidural spinal injections     Chronic narcotic dependence ([place]) 07/13/2019    Priority: High   PAD (peripheral artery disease) ([place]) 11/18/2016    Priority: High    MILD: LE [place] on 04/29/16, mild arterial occlusive disease. Mild right iliac arterial disease and mild left distal popliteal disease. Right ABI 1.06, Left ABI 1.07.     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, CHB 08/20/2015    Priority: High    Remote dual-chamber pacemaker transmission 11/10/2021: AP  3.6%, VP 99%.  Longevity 4 years and 5 months.  Lead impedance and thresholds normal.  No significant mode switches, brief AT for 8 seconds.  Scheduled  In office pacemaker check 07/08/2021 Single (S)/Dual (D)/BV: D. Presenting ASVP @ 80/min. Pacemaker dependant:  Yes. Underlying CHB. AP 2%, VP 100% AMS Episodes 1, brief AT.  AT/AF burden <1% .  HVR 0  Longevity 5 Years. Magnet rate: >85%. Lead measurements: Stable. Histogram: Low (L)/normal (N)/high (H)  Normal. Patient activity Good.   Observations: Normal pacemaker function. Changes: None.     Gout 12/03/2020    Priority: Medium    Chronic venous insufficiency 10/17/2020    Priority: Medium    Urinary incontinence in female, followed by GYN, did not tolerate pessary 06/27/2018    Priority: Medium    LBBB (left bundle branch block) 11/18/2016    Priority: Medium     EKG 07/08/16: Sinus rhythm , rate 80, left axis deviation, left anterior fascicular block, left bundle branch block.     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[place]) 10/03/2016  Priority: Medium    Paresthesia of both lower extremities 10/02/2016    Priority: Medium     Due to lumbar stenosis.neurontin     Chronic disease anemia 03/08/2016    Priority: Medium    Vitamin B12 deficiency 02/06/2020    Priority: Low   Complete heart block ([place]) 09/21/2021   Health Maintenance  Topic Date Due   Zoster Vaccines- Shingrix (2 of 2) 09/24/2019   HEMOGLOBIN A1C  03/21/2022   INFLUENZA VACCINE  03/30/2022   OPHTHALMOLOGY EXAM  10/08/2022   FOOT EXAM  12/03/2022   TETANUS/TDAP  06/15/2028   Pneumonia Vaccine 80+ Years old  Completed   DEXA SCAN  Completed   COVID-19 Vaccine  Completed   HPV VACCINES  Aged Out   Immunization History  Administered Date(s) Administered   Fluad Quad(high Dose 65+) 06/19/2019, 05/09/2020, 06/09/2021   Influenza, High Dose Seasonal PF 06/30/2017, 05/12/2018   PFIZER(Purple Top)SARS-COV-2 Vaccination 10/06/2019, 10/29/2019, 06/13/2020   Pfizer  Covid-19 Vaccine Bivalent Booster 48yr & up 06/30/2021   Pneumococcal Conjugate-13 05/12/2018   Pneumococcal Polysaccharide-23 07/13/2019   Tdap 06/15/2018   Zoster Recombinat (Shingrix) 07/30/2019   We updated and reviewed the patient's past history in detail and it is documented below. Allergies: Patient is allergic to prednisone. Past Medical History Patient  has a past medical history of Anemia, Arthritis, Complete heart block ([place] (08/20/2015), Diabetes mellitus without complication ([place], Encounter for care of pacemaker (05/24/2019), Hypercholesteremia, Hypertension, Pacemaker; 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 Past Surgical History Patient  has a past surgical history that includes Tubal ligation; Abdominal hysterectomy; Colonoscopy with propofol (N/A, 07/03/2013); left heart catheterization with coronary angiogram (N/A, 08/27/2014); Cardiac catheterization (N/A, 08/20/2015); and Joint replacement (Bilateral). Family History: Patient family history includes Breast cancer in her granddaughter; Cancer in her brother; Diabetes in her mother; Hypertension in her father and mother. Social History:  Patient  reports that she has never smoked. She has never used smokeless tobacco. She reports that she does not drink alcohol and does not use drugs.  Review of Systems: Constitutional: negative for fever or malaise Ophthalmic: negative for photophobia, double vision or loss of vision Cardiovascular: negative for chest pain, dyspnea on exertion, or new LE swelling Respiratory: negative for SOB or persistent cough Gastrointestinal: negative for abdominal pain, change in bowel habits or melena Genitourinary: negative for dysuria or gross hematuria, no abnormal uterine bleeding or disharge Musculoskeletal: negative for new gait disturbance or muscular weakness Integumentary:  negative for new or persistent rashes, no breast lumps Neurological: negative for TIA or stroke symptoms Psychiatric: negative for SI or delusions Allergic/Immunologic: negative for hives  Patient Care Team    Relationship Specialty Notifications Start End  ALeamon Arnt MD PCP - General Family Medicine  07/12/19   GAdrian Prows MD Consulting Physician Cardiology  11/18/16   Specialists, MPhiloPhysician Orthopedic Surgery  04/28/17   GMelissa Noon OKey LargoReferring Physician Optometry  04/28/17   MBjorn Loser MD Consulting Physician Urology  07/13/19   MGardiner Barefoot DSan Marcos Asc LLCConsulting Physician Podiatry  12/20/19   DEdythe Clarity RNortheast Baptist HospitalPharmacist Pharmacist  04/06/21    Comment: [phone]   Objective  Vitals: BP (!) 138/54   Pulse 81   Temp 98.3 F (36.8 C)   Ht '5\' 3"'  (1.6 m)   Wt 190 lb 12.8 oz (86.5 kg)   LMP  (  LMP Unknown)   SpO2 98%   BMI 33.80 kg/m  General:  Well developed, well nourished, no acute distress, appears well today. Psych:  Alert and orientedx3,normal mood and affect, bright affect HEENT:  Normocephalic, atraumatic, non-icteric sclera,  supple neck without adenopathy, mass or thyromegaly Cardiovascular:  Normal S1, S2, RRR soft murmur audible  respiratory:  Good breath sounds bilaterally, CTAB with normal respiratory effort Gastrointestinal: normal bowel sounds, soft, non-tender, no noted masses. No HSM MSK: no deformities, contusions. Joints are without erythema or swelling.  Skin:  Warm, no rashes or suspicious lesions noted Lower extremities: No pitting edema today.  Clear skin.  Commons side effects, risks, benefits, and alternatives for medications and treatment plan prescribed today were discussed, and the patient expressed understanding of the given instructions. Patient is instructed to call or message via MyChart if he/she has any questions or concerns regarding our treatment plan. No barriers to understanding were  identified. We discussed Red Flag symptoms and signs in detail. Patient expressed understanding regarding what to do in case of urgent or emergency type symptoms.  Medication list was reconciled, printed and provided to the patient in AVS. Patient instructions and summary information was reviewed with the patient as documented in the AVS. This note was prepared with assistance of Dragon voice recognition software. Occasional wrong-word or sound-a-like substitutions may have occurred due to the inherent limitations of voice recognition software  This visit occurred during the SARS-CoV-2 public health emergency.  Safety protocols were in place, including screening questions prior to the visit, additional usage of staff PPE, and extensive cleaning of exam room while observing appropriate contact time as indicated for disinfecting solutions.

## 2022-01-19 ENCOUNTER — Encounter: Payer: Self-pay | Admitting: Family Medicine

## 2022-01-19 NOTE — Progress Notes (Signed)
Please call patient: I have reviewed his/her lab results. Labs show that we need to cut the lasix back to once day again to help her kidneys. Also, please stop the potassium supplements completely. Needs lab visit in 2 weeks to recheck bmp for CKD and hyperkalemia please. All other labs are stable.

## 2022-01-20 ENCOUNTER — Telehealth: Payer: Self-pay

## 2022-01-20 ENCOUNTER — Other Ambulatory Visit: Payer: Self-pay | Admitting: Family Medicine

## 2022-01-20 NOTE — Telephone Encounter (Signed)
Message has been sent thru MyChart of Pt lab results and a message to Jonni Sanger to see about a script for Bp machine.

## 2022-01-20 NOTE — Telephone Encounter (Signed)
Daughter and pt are requesting a script for BP machine   Please Advise

## 2022-01-21 ENCOUNTER — Other Ambulatory Visit: Payer: Self-pay

## 2022-01-21 NOTE — Telephone Encounter (Signed)
Pharmacy called stating they cannot fill request for DME, "blood pressure machine".  No other information was given.

## 2022-01-21 NOTE — Telephone Encounter (Signed)
Note. Patient has been informed

## 2022-01-22 ENCOUNTER — Telehealth: Payer: Self-pay

## 2022-01-22 ENCOUNTER — Other Ambulatory Visit: Payer: Self-pay

## 2022-01-22 DIAGNOSIS — N189 Chronic kidney disease, unspecified: Secondary | ICD-10-CM

## 2022-01-22 DIAGNOSIS — E875 Hyperkalemia: Secondary | ICD-10-CM

## 2022-01-22 NOTE — Telephone Encounter (Signed)
Spoke with daughter and advised her per Jonni Sanger that pt does not need to monitor glucose at home so she will not need a machine.

## 2022-01-22 NOTE — Telephone Encounter (Signed)
Daughter was asking if you could send a script in for her mother to check her glucose not a BP machine

## 2022-02-03 ENCOUNTER — Other Ambulatory Visit (INDEPENDENT_AMBULATORY_CARE_PROVIDER_SITE_OTHER): Payer: Medicare Other

## 2022-02-03 DIAGNOSIS — N189 Chronic kidney disease, unspecified: Secondary | ICD-10-CM | POA: Diagnosis not present

## 2022-02-03 DIAGNOSIS — E875 Hyperkalemia: Secondary | ICD-10-CM | POA: Diagnosis not present

## 2022-02-03 LAB — BASIC METABOLIC PANEL
BUN: 44 mg/dL — ABNORMAL HIGH (ref 6–23)
CO2: 26 mEq/L (ref 19–32)
Calcium: 9.9 mg/dL (ref 8.4–10.5)
Chloride: 97 mEq/L (ref 96–112)
Creatinine, Ser: 1.76 mg/dL — ABNORMAL HIGH (ref 0.40–1.20)
GFR: 26.64 mL/min — ABNORMAL LOW (ref >60.00)
Glucose, Bld: 116 mg/dL — ABNORMAL HIGH (ref 70–99)
Potassium: 4.7 mEq/L (ref 3.5–5.1)
Sodium: 137 mEq/L (ref 135–145)

## 2022-02-04 ENCOUNTER — Other Ambulatory Visit: Payer: Self-pay | Admitting: Family Medicine

## 2022-02-04 MED ORDER — FUROSEMIDE 40 MG PO TABS: 40.0000 mg | ORAL_TABLET | Freq: Every day | ORAL | 3 refills | Status: DC

## 2022-02-04 NOTE — Progress Notes (Signed)
Please call patient: I have reviewed his/her lab results. Potassium is better.  No need to restart the potassium supplements. Please hold lasix for 3 days then may restart 40mg  once a day. Need to start recording daily weights.  Kidney function is worsening. Please hold metformin.  Please schedule OV in 2-3 weeks with me to recheck things. May need to see cardiology again and may need to start seeing kidney specialist.

## 2022-02-09 DIAGNOSIS — I442 Atrioventricular block, complete: Secondary | ICD-10-CM | POA: Diagnosis not present

## 2022-02-09 DIAGNOSIS — Z45018 Encounter for adjustment and management of other part of cardiac pacemaker: Secondary | ICD-10-CM | POA: Diagnosis not present

## 2022-02-10 ENCOUNTER — Telehealth: Payer: Self-pay | Admitting: Family Medicine

## 2022-02-10 NOTE — Telephone Encounter (Signed)
Pt's daughter states she would like a call back about pt's metformin.

## 2022-02-11 NOTE — Telephone Encounter (Signed)
You can discuss with daughter-kidney function worsened and therefore metformin was stopped.  I am glad patient has follow-up early next month-is it possible to work patient in sooner to discuss options?  Definitely avoid any sugar sweetened beverages or high carbohydrate options until can get in for a visit-any particular trigger for when it went up to 393?  Could also schedule her with a visit with another provider to discuss potential options prior to Dr. Jonni Sanger coming back

## 2022-02-12 NOTE — Telephone Encounter (Signed)
Message was sent thru MyChart per Daughter's request on instructions

## 2022-02-18 ENCOUNTER — Other Ambulatory Visit: Payer: Self-pay | Admitting: Family Medicine

## 2022-02-22 ENCOUNTER — Telehealth: Payer: Self-pay | Admitting: Family Medicine

## 2022-02-22 NOTE — Telephone Encounter (Signed)
States Dr. Mardelle Matte took patient off of Metformin.  Daughter would like to know if this is permanent or if patient will be taking another med in place of the metformin?

## 2022-02-23 NOTE — Telephone Encounter (Signed)
We have scheduled patient for 7/3

## 2022-02-23 NOTE — Telephone Encounter (Signed)
Please advise, Patient has OV on 03/19/2022

## 2022-02-26 ENCOUNTER — Ambulatory Visit (HOSPITAL_COMMUNITY)
Admission: EM | Admit: 2022-02-26 | Discharge: 2022-02-26 | Disposition: A | Discharge status: Home / Self Care | Payer: Medicare Other | Attending: Physician Assistant | Admitting: Physician Assistant

## 2022-02-26 ENCOUNTER — Ambulatory Visit: Payer: Medicare Other | Admitting: Family Medicine

## 2022-02-26 ENCOUNTER — Encounter (HOSPITAL_COMMUNITY): Payer: Self-pay | Admitting: Emergency Medicine

## 2022-02-26 DIAGNOSIS — R630 Anorexia: Secondary | ICD-10-CM | POA: Diagnosis not present

## 2022-02-26 DIAGNOSIS — E1169 Type 2 diabetes mellitus with other specified complication: Secondary | ICD-10-CM | POA: Diagnosis not present

## 2022-02-26 DIAGNOSIS — R5383 Other fatigue: Secondary | ICD-10-CM | POA: Diagnosis not present

## 2022-02-26 DIAGNOSIS — E785 Hyperlipidemia, unspecified: Secondary | ICD-10-CM | POA: Diagnosis not present

## 2022-02-26 DIAGNOSIS — R5381 Other malaise: Secondary | ICD-10-CM | POA: Diagnosis not present

## 2022-02-26 DIAGNOSIS — I10 Essential (primary) hypertension: Secondary | ICD-10-CM | POA: Diagnosis not present

## 2022-02-26 DIAGNOSIS — E1159 Type 2 diabetes mellitus with other circulatory complications: Secondary | ICD-10-CM | POA: Diagnosis not present

## 2022-02-26 LAB — POCT URINALYSIS DIPSTICK, ED / UC
Bilirubin Urine: NEGATIVE
Glucose, UA: NEGATIVE mg/dL
Hgb urine dipstick: NEGATIVE
Ketones, ur: NEGATIVE mg/dL
Nitrite: NEGATIVE
Protein, ur: NEGATIVE mg/dL
Specific Gravity, Urine: 1.01 (ref 1.005–1.030)
Urobilinogen, UA: 0.2 mg/dL (ref 0.0–1.0)
pH: 5 (ref 5.0–8.0)

## 2022-02-26 LAB — CBG MONITORING, ED: Glucose-Capillary: 123 mg/dL — ABNORMAL HIGH (ref 70–99)

## 2022-02-26 NOTE — Discharge Instructions (Signed)
Her blood sugar was normal.  She did have a few white blood cells in her urine we will send this off for culture but since she is not having any symptoms we will hold off on starting antibiotic.  We will contact you if need to start an antibiotic.  Unfortunately, we were unable to get her blood work drawn today.  I do recommend that you push fluids and we can attempt again tomorrow.  If you have any worsening symptoms including shortness of breath, blood in your stool, chest pain, nausea/vomiting, worsening weakness you need to go to the emergency room immediately as we discussed.  Please follow-up with your primary care provider first thing next week for reevaluation.

## 2022-02-26 NOTE — ED Triage Notes (Signed)
Pt is present today with concerns for loss of appetite and liquids and diarrhea. Pt sx started one week  ago

## 2022-02-26 NOTE — ED Provider Notes (Signed)
[place]    CSN: [id number] Arrival date & time: 02/26/22  1620      History   Chief Complaint Chief Complaint  Patient presents with   decreased appetite   Diarrhea    HPI Krista Clarke is a 81 y.o. female.   Patient presents today companied by her daughter who provide the majority of history.  Reports for the past week she has not been acting herself.  She has been more lethargic, had a decreased appetite, had some diarrhea.  Denies any abdominal pain, melena, hematochezia.  Denies any cough, congestion, chest pain, shortness of breath.  Does report that a few weeks ago her metformin was stopped due to decreased kidney function and has not been replaced with any other medications.  They have been monitoring her blood sugar which has been relatively normal but has been as high as 390+.  Also reports that her furosemide and potassium were slightly decreased as well.  Denies additional medication changes.  Denies any known sick contacts, recent travel, suspicious food intake.  Denies history of gastrointestinal disorder outside of occasional GI upset related to metformin.  Patient denies any additional specific symptoms but reports that she just does not feel good.    Past Medical History:  Diagnosis Date   Anemia    takes iron daily   Arthritis    Complete heart block ([place]) 08/20/2015   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     Diabetes mellitus without complication ([place])    Encounter for care of pacemaker 05/24/2019   Hypercholesteremia    Hypertension    Pacemaker; 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 EGMs show AT/VP. There were 0 high ventricular rate episodes detected. Health trends do not demonstrate significant abnormality. Battery longevity is 10.4 - 11.2 years. RA pacing is 1.1 %, RV pacing is >99 %.   Scheduled  In office pacemaker check 07/12/19  Single (S)/Dual (D)/BV: D. Presenting ASVP @ 70/min.    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 Marlou Sa 2010 lumbar MRI, multilevel and severe.    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, CHB 08/20/2015    Past Surgical History:  Procedure Laterality Date   ABDOMINAL HYSTERECTOMY     COLONOSCOPY WITH PROPOFOL N/A 07/03/2013   Procedure: COLONOSCOPY WITH PROPOFOL;  Surgeon: Garlan Fair, MD;  Location: [facility];  Service: Endoscopy;  Laterality: N/A;   EP IMPLANTABLE DEVICE N/A 08/20/2015   Procedure: Pacemaker Implant;  Surgeon: Evans Lance, MD;  Location: [facility];  Service: Cardiovascular;  Laterality: N/A;   JOINT REPLACEMENT Bilateral    '99. Knees   LEFT HEART CATHETERIZATION WITH CORONARY ANGIOGRAM N/A 08/27/2014   Procedure: LEFT HEART CATHETERIZATION WITH CORONARY ANGIOGRAM;  Surgeon: Laverda Page,  MD;  Location: [facility] CATH LAB;  Service: Cardiovascular;  Laterality: N/A;   TUBAL LIGATION      OB History   No obstetric history on file.      Home Medications    Prior to Admission medications   Medication Sig Start Date End Date Taking? Authorizing Provider  allopurinol (ZYLOPRIM) 100 MG tablet TAKE 1 TABLET BY MOUTH EVERY MORNING 11/26/21   Leamon Arnt, MD  amLODipine (NORVASC) 10 MG tablet TAKE 1 TABLET BY MOUTH EVERY DAY 10/29/21   Leamon Arnt, MD  ASPIRIN LOW DOSE 81 MG chewable tablet CHEW ONE TABLET BY MOUTH EVERY DAY 05/25/21   Leamon Arnt, MD  atorvastatin (LIPITOR) 40 MG tablet TAKE 1 TABLET BY MOUTH AT BEDTIME 09/15/21   Leamon Arnt, MD  diclofenac Sodium (VOLTAREN) 1 % GEL Apply 4 GRAMS topically 4 TIMES daily AS NEEDED FOR PAIN 10/29/21   Leamon Arnt, MD  donepezil (ARICEPT ODT) 5 MG disintegrating tablet Take 1 tablet (5 mg total) by mouth at bedtime. 01/18/22   Leamon Arnt, MD  FEROSUL 325 (65 Fe) MG tablet TAKE 1 TABLET BY MOUTH 2 TIMES DAILY 10/29/21   Leamon Arnt, MD  furosemide (LASIX) 40 MG tablet Take 1 tablet (40 mg total) by mouth daily. 02/04/22   Leamon Arnt, MD  gabapentin (NEURONTIN) 300 MG capsule TAKE 2 CAPSULES BY MOUTH 3 TIMES DAILY 11/26/21   Leamon Arnt, MD  HYDROcodone-acetaminophen (NORCO/VICODIN) 5-325 MG tablet TAKE 1 TABLET BY MOUTH EVERY MORNING AND AT BEDTIME 01/18/22   Leamon Arnt, MD  losartan (COZAAR) 50 MG tablet TAKE 1 TABLET BY MOUTH EVERY DAY 05/25/21   Leamon Arnt, MD  Lutein 20 MG CAPS TAKE 1 CAPSULE BY MOUTH EVERY DAY 01/20/22   Leamon Arnt, MD  magnesium oxide (MAG-OX) 400 (240 Mg) MG tablet TAKE 1 TABLET BY MOUTH EVERY DAY 02/18/22   Leamon Arnt, MD  meclizine (ANTIVERT) 12.5 MG tablet Take 1 tablet (12.5 mg total) by mouth 2 (two) times daily as needed for dizziness. 08/26/20   Leamon Arnt, MD  metoprolol tartrate (LOPRESSOR) 25 MG tablet TAKE 1 TABLET BY MOUTH 2 TIMES DAILY 05/25/21   Leamon Arnt, MD  omeprazole (PRILOSEC) 20 MG capsule TAKE 1 CAPSULE BY MOUTH EVERY DAY BEFORE breakfast 12/24/21   Leamon Arnt, MD  triamcinolone (KENALOG) 0.1 % Apply 1 application topically 2 (two) times daily. For 2 weeks, then as needed 10/17/20   Leamon Arnt, MD  vitamin C (ASCORBIC ACID) 500 MG tablet TAKE 1 TABLET BY MOUTH EVERY DAY 01/20/22   Leamon Arnt, MD    Family History Family History  Problem Relation Age of Onset   Hypertension Mother    Diabetes Mother    Hypertension Father    Cancer Brother    Breast cancer Granddaughter        20s    Social History Social History   Tobacco Use   Smoking status: Never   Smokeless tobacco: Never  Vaping Use   Vaping Use: Never used  Substance Use Topics   Alcohol use: No   Drug use: No     Allergies   Prednisone   Review of Systems Review of Systems  Constitutional:  Positive for activity change and fatigue. Negative for appetite change and fever.  HENT:  Negative for congestion, sinus pressure, sneezing and sore throat.   Respiratory:  Negative for cough and shortness of breath.   Cardiovascular:  Negative for chest pain.  Gastrointestinal:  Positive for diarrhea. Negative for abdominal pain, constipation, nausea and vomiting.  Genitourinary:  Negative for dysuria, frequency and urgency.  Musculoskeletal:  Negative for arthralgias and myalgias.  Neurological:  Negative for dizziness, light-headedness and headaches.     Physical Exam Triage Vital Signs ED Triage Vitals  Enc Vitals Group     BP 02/26/22 1709 (!) 146/79     Pulse Rate 02/26/22 1709 60     Resp 02/26/22 1709 17     Temp 02/26/22 1709 98.1 F (36.7 C)     Temp src --      SpO2 02/26/22 1709 96 %     Weight --      Height --      Head Circumference --      Peak Flow --      Pain Score 02/26/22 1708 0     Pain Loc --      Pain Edu? --      Excl. in [place]? --    No data found.  Updated Vital Signs BP (!) 146/79   Pulse 60   Temp 98.1 F  (36.7 C)   Resp 17   LMP  (LMP Unknown)   SpO2 96%   Visual Acuity Right Eye Distance:   Left Eye Distance:   Bilateral Distance:    Right Eye Near:   Left Eye Near:    Bilateral Near:     Physical Exam Vitals reviewed.  Constitutional:      General: She is awake. She is not in acute distress.    Appearance: Normal appearance. She is well-developed. She is not ill-appearing.     Comments: Very pleasant female appears stated age in no acute distress sitting comfortably in exam room  HENT:     Head: Normocephalic and atraumatic.     Right Ear: Tympanic membrane, ear canal and external ear normal. There is impacted cerumen. Tympanic membrane is not erythematous or bulging.     Left Ear: Tympanic membrane, ear canal and external ear normal. There is impacted cerumen. Tympanic membrane is not erythematous or bulging.     Ears:     Comments: Partial cerumen impaction noted bilaterally; able to visualize approximately 40% of TM that appears normal.    Nose:     Right Sinus: No maxillary sinus tenderness or frontal sinus tenderness.     Left Sinus: No maxillary sinus tenderness or frontal sinus tenderness.     Mouth/Throat:     Pharynx: Uvula midline. No oropharyngeal exudate or posterior oropharyngeal erythema.  Cardiovascular:     Rate and Rhythm: Normal rate and regular rhythm.     Heart sounds: Normal heart sounds, S1 normal and S2 normal. No murmur heard. Pulmonary:     Effort: Pulmonary effort is normal.     Breath sounds: Normal breath sounds. No wheezing, rhonchi or rales.     Comments: Clear to auscultation bilaterally Abdominal:     General: Bowel sounds are normal.  Palpations: Abdomen is soft.     Tenderness: There is no abdominal tenderness. There is no right CVA tenderness, left CVA tenderness, guarding or rebound.     Comments: Benign abdominal exam  Musculoskeletal:     Comments: Strength 5/5 bilateral upper and lower extremities  Neurological:     General:  No focal deficit present.     Mental Status: She is alert.     Cranial Nerves: Cranial nerves 2-12 are intact.     Motor: Motor function is intact.     Coordination: Coordination is intact.     Comments: No focal neurological defect on exam.  Psychiatric:        Behavior: Behavior is cooperative.      UC Treatments / Results  Labs (all labs ordered are listed, but only abnormal results are displayed) Labs Reviewed  POCT URINALYSIS DIPSTICK, ED / UC - Abnormal; Notable for the following components:      Result Value   Leukocytes,Ua TRACE (*)    All other components within normal limits  CBG MONITORING, ED - Abnormal; Notable for the following components:   Glucose-Capillary 123 (*)    All other components within normal limits  URINE CULTURE  CBC WITH DIFFERENTIAL/PLATELET  COMPREHENSIVE METABOLIC PANEL    EKG   Radiology No results found.  Procedures Procedures (including critical care time)  Medications Ordered in UC Medications - No data to display  Initial Impression / Assessment and Plan / UC Course  I have reviewed the triage vital signs and the nursing notes.  Pertinent labs & imaging results that were available during my care of the patient were reviewed by me and considered in my medical decision making (see chart for details).     Patient is well-appearing, afebrile, nontoxic, nontachycardic.  Her capillary glucose was 123 which is appropriate for age.  UA showed trace leukocytes but otherwise was normal.  Patient denies any signs/symptoms of UTI so we will defer antibiotics.  Culture was obtained and we will contact her if this is positive and we need to start antibiotics.  Ordered CBC and CMP but unfortunately nursing staff was unable to draw this.  We do not have an ability to obtain outpatient labs at a different facility given it is 645 on a weekend but will have patient push fluids and attempt to return tomorrow to have blood drawn.  She is to push fluids  and eat small frequent meals.  Discussed that she should follow-up with her primary care provider first thing next week.  Discussed with daughter at length that if she has any worsening symptoms including worsening weakness, shortness of breath, chest pain, nausea, vomiting, confusion she needs to go to the emergency room immediately for further evaluation and management.  Final Clinical Impressions(s) / UC Diagnoses   Final diagnoses:  Malaise and fatigue  Decreased appetite     Discharge Instructions      Her blood sugar was normal.  She did have a few white blood cells in her urine we will send this off for culture but since she is not having any symptoms we will hold off on starting antibiotic.  We will contact you if need to start an antibiotic.  Unfortunately, we were unable to get her blood work drawn today.  I do recommend that you push fluids and we can attempt again tomorrow.  If you have any worsening symptoms including shortness of breath, blood in your stool, chest pain, nausea/vomiting, worsening weakness you need  to go to the emergency room immediately as we discussed.  Please follow-up with your primary care provider first thing next week for reevaluation.     ED Prescriptions   None    PDMP not reviewed this encounter.   Terrilee Croak, PA-C 02/26/22 1844

## 2022-02-28 LAB — URINE CULTURE

## 2022-03-01 ENCOUNTER — Ambulatory Visit (INDEPENDENT_AMBULATORY_CARE_PROVIDER_SITE_OTHER): Payer: Medicare Other | Admitting: Family Medicine

## 2022-03-01 ENCOUNTER — Telehealth: Payer: Self-pay | Admitting: Family Medicine

## 2022-03-01 ENCOUNTER — Encounter: Payer: Self-pay | Admitting: Family Medicine

## 2022-03-01 VITALS — BP 130/52 | HR 64 | Temp 98.4°F | Ht 63.0 in | Wt 178.8 lb

## 2022-03-01 DIAGNOSIS — F02A0 Dementia in other diseases classified elsewhere, mild, without behavioral disturbance, psychotic disturbance, mood disturbance, and anxiety: Secondary | ICD-10-CM | POA: Diagnosis not present

## 2022-03-01 DIAGNOSIS — I872 Venous insufficiency (chronic) (peripheral): Secondary | ICD-10-CM | POA: Diagnosis not present

## 2022-03-01 DIAGNOSIS — E1140 Type 2 diabetes mellitus with diabetic neuropathy, unspecified: Secondary | ICD-10-CM

## 2022-03-01 DIAGNOSIS — N189 Chronic kidney disease, unspecified: Secondary | ICD-10-CM | POA: Diagnosis not present

## 2022-03-01 DIAGNOSIS — G301 Alzheimer's disease with late onset: Secondary | ICD-10-CM | POA: Diagnosis not present

## 2022-03-01 DIAGNOSIS — I152 Hypertension secondary to endocrine disorders: Secondary | ICD-10-CM | POA: Diagnosis not present

## 2022-03-01 DIAGNOSIS — E1159 Type 2 diabetes mellitus with other circulatory complications: Secondary | ICD-10-CM

## 2022-03-01 MED ORDER — METOPROLOL SUCCINATE ER 25 MG PO TB24: 25.0000 mg | ORAL_TABLET | Freq: Every day | ORAL | 3 refills | Status: DC

## 2022-03-01 MED ORDER — AMLODIPINE BESYLATE 5 MG PO TABS
5.0000 mg | ORAL_TABLET | Freq: Every day | ORAL | 3 refills | Status: DC
Start: 2022-03-01 — End: 2023-01-14

## 2022-03-01 NOTE — Patient Instructions (Addendum)
Please follow up as scheduled for your next visit with me: 07/21/2022   If you have any questions or concerns, please don't hesitate to send me a message via MyChart or call the office at [phone]. Thank you for visiting with [place] today! It's our pleasure caring for you.   I have changed a few medications.   Stop the aricept.  I've ordered a lower dose of the amlodipine: 5mg  daily. (Stop the amlodipine 10mg  daily) I've ordered a lower dose of the metoprolol: Toprol XL 25mg  daily. (Stop the metoprolol 25 twice a day).  I will let you know your blood test results and what I will do for your diabetes.

## 2022-03-01 NOTE — Telephone Encounter (Signed)
Caller states she would like to confirm with PCP team which medications are being continued and which are not. Paperwork received from pt's daughter does not match the listed medications they have on file.    Please call back.

## 2022-03-01 NOTE — Progress Notes (Signed)
Subjective  CC:  Chief Complaint  Patient presents with   lab Follow up.    Daughter has been checking BS 3x a day since stopping metformin and need to see about an alternative or other suggestions.    HPI: Krista Clarke is a 81 y.o. female who presents to the office today for follow up of diabetes and problems listed above in the chief complaint.  80 year old female with diabetes, chronic pain, hypertension and dementia presents with her daughter for follow-up.  Several things have been recently adjusted.  I reviewed recent emergency room visit for malaise and fatigue.  Patient and her daughter report that she has not felt well since stopping the metformin about 6 months ago.  She describes vague symptoms like decreased appetite and fatigue but denies specifically fever chills nausea vomiting abdominal pain cramping melena change in pain chest pain or shortness of breath.  She did have 2 days of diarrhea at the onset of her symptoms but these resolved spontaneously. Diabetes: I stopped metformin due to decreasing GFR likely due to prerenal etiology on top of chronic kidney disease.  Her blood sugars have been variable since.  Sometimes as low as 88 fasting and sometimes as high as 200.  Her p.o. intake has been variable due to lack of appetite.  She denies abdominal pain. Hypertension: Blood pressures have been borderline low with some low heart rates.  She denies lightheadedness.  She is on metoprolol 25 twice daily along with losartan 50 daily and amlodipine 10. Lower extremity edema due to chronic venous insufficiency: She and her daughter reports this is much better.  She is now only on Lasix 40 mg daily.  No longer on potassium supplementation due to hyperkalemia.  No redness or rash or pain. Dementia: She and her daughter both report that mentation is worsening.  Struggles with taking medications on her own now.  Daughter is having to manage this for her.  Forgetful.   BP Readings from Last  3 Encounters:  03/01/22 (!) 130/52  02/26/22 (!) 146/79  01/18/22 (!) 138/54    Assessment  1. Type 2 diabetes, controlled, with neuropathy ([place])   2. Chronic kidney disease, unspecified CKD stage   3. Hypertension associated with diabetes ([place])   4. Mild late onset Alzheimer's dementia without behavioral disturbance, psychotic disturbance, mood disturbance, or anxiety ([place])   5. Chronic venous insufficiency      Plan  Diabetes is currently adequately controlled.  Given age and dementia do not want tight control.  Education given.  We will check renal function and if improved, can consider restarting low-dose metformin or low-dose Farxiga. Chronic kidney disease with acute kidney insufficiency due to dehydration: Recheck today on lower dose of Lasix.  If continues to worsen, will need renal referral.  We will check BUN to see if this is playing a role in her decreased appetite. Low appetite: Could be related to dementia Dementia: Patient prefers to stop Aricept.  See if this is contributing to any of her side effects. Chronic venous insufficiency: Decrease amlodipine to 5 mg daily and will continue Lasix at 40 daily now.  May be able to cut back on that as well.  Monitoring BUN and creatinine.  Legs look good today.  I spent a total of  review 70 minutes for this patient encounter. Time spent included preparation, face-to-face counseling with the patient and coordination of care, review of chart and records, and documentation of the encounter.   Follow up:  3 months. Orders Placed This Encounter  Procedures   CBC with Differential/Platelet   Comprehensive metabolic panel   Magnesium   TSH   Iron, TIBC and Ferritin Panel   Meds ordered this encounter  Medications   amLODipine (NORVASC) 5 MG tablet    Sig: Take 1 tablet (5 mg total) by mouth daily.    Dispense:  90 tablet    Refill:  3   metoprolol succinate (TOPROL-XL) 25 MG 24 hr tablet    Sig: Take 1 tablet (25 mg total) by  mouth daily.    Dispense:  90 tablet    Refill:  3      Immunization History  Administered Date(s) Administered   Fluad Quad(high Dose 65+) 06/19/2019, 05/09/2020, 06/09/2021   Influenza, High Dose Seasonal PF 06/30/2017, 05/12/2018   PFIZER(Purple Top)SARS-COV-2 Vaccination 10/06/2019, 10/29/2019, 06/13/2020   Pfizer Covid-19 Vaccine Bivalent Booster 43yrs & up 06/30/2021   Pneumococcal Conjugate-13 05/12/2018   Pneumococcal Polysaccharide-23 07/13/2019   Tdap 06/15/2018   Zoster Recombinat (Shingrix) 07/30/2019    Diabetes Related Lab Review: Lab Results  Component Value Date   HGBA1C 6.7 (H) 01/18/2022   HGBA1C 6.7 (H) 09/21/2021   HGBA1C 6.2 (A) 06/09/2021    Lab Results  Component Value Date   MICROALBUR 2.4 (H) 01/18/2022   Lab Results  Component Value Date   CREATININE 1.76 (H) 02/03/2022   BUN 44 (H) 02/03/2022   NA 137 02/03/2022   K 4.7 02/03/2022   CL 97 02/03/2022   CO2 26 02/03/2022   Lab Results  Component Value Date   CHOL 120 01/18/2022   CHOL 126 12/03/2020   CHOL 142 02/06/2020   Lab Results  Component Value Date   HDL 49.90 01/18/2022   HDL 49.90 12/03/2020   HDL 51.80 02/06/2020   Lab Results  Component Value Date   LDLCALC 45 01/18/2022   LDLCALC 50 12/03/2020   LDLCALC 61 02/06/2020   Lab Results  Component Value Date   TRIG 124.0 01/18/2022   TRIG 127.0 12/03/2020   TRIG 143.0 02/06/2020   Lab Results  Component Value Date   CHOLHDL 2 01/18/2022   CHOLHDL 3 12/03/2020   CHOLHDL 3 02/06/2020   No results found for: "LDLDIRECT" The ASCVD Risk score (Arnett DK, et al., 2019) failed to calculate for the following reasons:   The 2019 ASCVD risk score is only valid for ages 53 to 19 I have reviewed the [place], Fam and Soc history. Patient Active Problem List   Diagnosis Date Noted   Lumbar stenosis 10/31/2019    Priority: High    Dr. Marlou Sa 2010 lumbar MRI, multilevel and severe. Failed cymbalta, PT, lyrica and gabapentin.  [facility] at [facility] with epidural spinal injections    Chronic narcotic dependence ([place]) 07/13/2019    Priority: High   PAD (peripheral artery disease) ([place]) 11/18/2016    Priority: High    MILD: LE [place] on 04/29/16, mild arterial occlusive disease. Mild right iliac arterial disease and mild left distal popliteal disease. Right ABI 1.06, Left ABI 1.07.    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  Longevity 4 years and 1 months.  Lead impedance and thresholds normal.  No significant mode switches, normal pacemaker function.  Scheduled  In office pacemaker check 07/08/2021 Single (S)/Dual (D)/BV: D. Presenting ASVP @ 80/min. Pacemaker dependant:  Yes. Underlying CHB. AP 2%, VP 100% AMS Episodes 1, brief AT.  AT/AF burden <1% .  HVR 0  Longevity 5 Years. Magnet rate: >85%. Lead measurements: Stable. Histogram: Low (L)/normal (N)/high (H)  Normal. Patient activity Good.   Observations: Normal pacemaker function. Changes: None.     Gout 12/03/2020    Priority: Medium    Chronic venous insufficiency 10/17/2020    Priority: Medium    Urinary incontinence in female, followed by GYN, did not tolerate pessary 06/27/2018    Priority: Medium    LBBB (left bundle branch block) 11/18/2016     Priority: Medium     EKG 07/08/16: Sinus rhythm , rate 80, left axis deviation, left anterior fascicular block, left bundle branch block.    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 She has never used smokeless tobacco. She reports that she does not drink alcohol and does not use drugs.  Review of Systems: Ophthalmic: negative for eye pain, loss of vision or double vision Cardiovascular: negative for chest pain Respiratory: negative for SOB or persistent cough Gastrointestinal: negative for abdominal pain Genitourinary: negative for dysuria or gross hematuria MSK: negative for foot lesions Neurologic: negative for weakness or gait disturbance  Objective  Vitals: BP (!) 130/52   Pulse 64   Temp 98.4 F (36.9 C)   Ht 5\' 3"  (1.6 m)   Wt 178 lb 12.8 oz (81.1 kg)   LMP  (LMP Unknown)   SpO2 96%   BMI 31.67 kg/m  General: well appearing, no acute distress  Psych:  Alert and oriented, normal mood and affect HEENT:  Normocephalic, atraumatic, moist mucous membranes, supple neck  Cardiovascular:  Nl S1 and S2, RRR without murmur, gallop or rub. tr edema Respiratory:  Good breath sounds bilaterally, CTAB with normal effort, no rales Gastrointestinal: normal BS, soft, nontender Skin:  Warm, no rashes     Diabetic education: ongoing education regarding chronic disease management for diabetes was given today. We continue to reinforce the ABC's of diabetic management: A1c (<7 or 8 dependent upon patient), tight blood pressure control, and cholesterol management with goal LDL < 100 minimally. We discuss diet strategies, exercise  recommendations, medication options and possible side effects. At each visit, we review recommended immunizations and preventive care recommendations for diabetics and stress that good diabetic control can prevent other problems. See below for this patient's data.   Commons side effects, risks, benefits, and alternatives for medications and treatment plan prescribed today were discussed,  and the patient expressed understanding of the given instructions. Patient is instructed to call or message via MyChart if he/she has any questions or concerns regarding our treatment plan. No barriers to understanding were identified. We discussed Red Flag symptoms and signs in detail. Patient expressed understanding regarding what to do in case of urgent or emergency type symptoms.  Medication list was reconciled, printed and provided to the patient in AVS. Patient instructions and summary information was reviewed with the patient as documented in the AVS. This note was prepared with assistance of Dragon voice recognition software. Occasional wrong-word or sound-a-like substitutions may have occurred due to the inherent limitations of voice recognition software  This visit occurred during the SARS-CoV-2 public health emergency.  Safety protocols were in place, including screening questions prior to the visit, additional usage of staff PPE, and extensive cleaning of exam room while observing appropriate contact time as indicated for disinfecting solutions.

## 2022-03-01 NOTE — Progress Notes (Deleted)
Chronic Care Management Pharmacy Note  03/01/2022 Name:  Krista Clarke MRN:  [id number] DOB:  Nov 09, 1940  Summary: Initial PharmD visit.  Meds reviewed, she already uses a pill pack service and they deliver to her house.  Adherence is well monitored.  Does mention some swelling in lower extremities, however this resolves overnight or with feet elevation.  Plans to start PT soon to help her with stairs.  Recommendations/Changes made from today's visit Increase mobility with help of PT  Plan: FU 6 months   Subjective: Krista Clarke is an 81 y.o. year old female who is a primary patient of Leamon Arnt, MD.  The CCM team was consulted for assistance with disease management and care coordination needs.    Engaged with patient by telephone for initial visit in response to provider referral for pharmacy case management and/or care coordination services.   Consent to Services:  The patient was given the following information about Chronic Care Management services today, agreed to services, and gave verbal consent: 1. CCM service includes personalized support from designated clinical staff supervised by the primary care provider, including individualized plan of care and coordination with other care providers 2. 24/7 contact phone numbers for assistance for urgent and routine care needs. 3. Service will only be billed when office clinical staff spend 20 minutes or more in a month to coordinate care. 4. Only one practitioner may furnish and bill the service in a calendar month. 5.The patient may stop CCM services at any time (effective at the end of the month) by phone call to the office staff. 6. The patient will be responsible for cost sharing (co-pay) of up to 20% of the service fee (after annual deductible is met). Patient agreed to services and consent obtained.  Patient Care Team: Leamon Arnt, MD as PCP - General (Family Medicine) Adrian Prows, MD as Consulting Physician  (Cardiology) [facility], [place] as Consulting Physician (Orthopedic Surgery) Melissa Noon, [place] as Referring Physician (Optometry) Bjorn Loser, MD as Consulting Physician (Urology) Gardiner Barefoot, DPM as Consulting Physician (Podiatry) Edythe Clarity, [facility] as Pharmacist (Pharmacist)  Recent office visits:  03/09/2021 OV (PCP) Leamon Arnt, MD; decrease norco to twice daily as needed, follow up in 3 months for diabetes and back pain.   12/03/2020 OV (PCP) Leamon Arnt, MD; annual exam, no medication changes.   Recent consult visits:  11/25/2020 OV (podiatry) Pain due to onychomycosis, diabetic polyneuropathy, no medication changes indicated.   Hospital visits:  None in previous 6 months   Objective:  Lab Results  Component Value Date   CREATININE 1.76 (H) 02/03/2022   BUN 44 (H) 02/03/2022   GFR 26.64 (L) 02/03/2022   GFRNONAA 54 (L) 04/04/2020   GFRAA >60 04/04/2020   NA 137 02/03/2022   K 4.7 02/03/2022   CALCIUM 9.9 02/03/2022   CO2 26 02/03/2022   GLUCOSE 116 (H) 02/03/2022    Lab Results  Component Value Date/Time   HGBA1C 6.7 (H) 01/18/2022 11:25 AM   HGBA1C 6.7 (H) 09/21/2021 04:25 PM   GFR 26.64 (L) 02/03/2022 11:51 AM   GFR 30.56 (L) 01/18/2022 11:25 AM   MICROALBUR 2.4 (H) 01/18/2022 10:04 AM    Last diabetic Eye exam:  Lab Results  Component Value Date/Time   HMDIABEYEEXA No Retinopathy 06/12/2019 12:00 AM    Last diabetic Foot exam: No results found for: "HMDIABFOOTEX"   Lab Results  Component Value Date   CHOL 120 01/18/2022   HDL  49.90 01/18/2022   LDLCALC 45 01/18/2022   TRIG 124.0 01/18/2022   CHOLHDL 2 01/18/2022       Latest Ref Rng & Units 01/18/2022   11:25 AM 09/21/2021    4:25 PM 12/03/2020   10:36 AM  Hepatic Function  Total Protein 6.0 - 8.3 g/dL 7.9  8.4  7.1   Albumin 3.5 - 5.2 g/dL 4.4  4.7  4.2   AST 0 - 37 U/L '11  12  12   ' ALT 0 - 35 U/L '8  8  12   ' Alk Phosphatase 39 - 117 U/L 73  62  64    Total Bilirubin 0.2 - 1.2 mg/dL 0.3  0.4  0.5     Lab Results  Component Value Date/Time   TSH 4.28 03/09/2021 03:33 PM   TSH 4.74 (H) 12/03/2020 10:36 AM   FREET4 0.92 03/09/2021 03:33 PM       Latest Ref Rng & Units 01/18/2022   11:25 AM 12/03/2020   10:36 AM 04/04/2020    3:14 AM  CBC  WBC 4.0 - 10.5 K/uL 6.9  6.6  7.4   Hemoglobin 12.0 - 15.0 g/dL 9.8  9.6  9.9   Hematocrit 36.0 - 46.0 % 30.5  29.8  33.6   Platelets 150.0 - 400.0 K/uL 278.0  250.0  308     No results found for: "VD25OH"  Clinical ASCVD: Yes  The ASCVD Risk score (Arnett DK, et al., 2019) failed to calculate for the following reasons:   The 2019 ASCVD risk score is only valid for ages 37 to 42       03/06/2021   11:53 AM 12/03/2020   10:05 AM 12/20/2019    2:45 PM  Depression screen PHQ 2/9  Decreased Interest 0 0 0  Down, Depressed, Hopeless 0 0 0  PHQ - 2 Score 0 0 0     Social History   Tobacco Use  Smoking Status Never  Smokeless Tobacco Never   BP Readings from Last 3 Encounters:  03/01/22 (!) 130/52  02/26/22 (!) 146/79  01/18/22 (!) 138/54   Pulse Readings from Last 3 Encounters:  03/01/22 64  02/26/22 60  01/18/22 81   Wt Readings from Last 3 Encounters:  03/01/22 178 lb 12.8 oz (81.1 kg)  01/18/22 190 lb 12.8 oz (86.5 kg)  11/02/21 189 lb 12.8 oz (86.1 kg)   BMI Readings from Last 3 Encounters:  03/01/22 31.67 kg/m  01/18/22 33.80 kg/m  11/02/21 33.62 kg/m    Assessment/Interventions: Review of patient past medical history, allergies, medications, health status, including review of consultants reports, laboratory and other test data, was performed as part of comprehensive evaluation and provision of chronic care management services.   SDOH:  (Social Determinants of Health) assessments and interventions performed: Yes  Financial Resource Strain: Low Risk  (03/06/2021)   Overall Financial Resource Strain (CARDIA)    Difficulty of Paying Living Expenses: Not hard at all     SDOH Screenings   Alcohol Screen: Not on file  Depression (PHQ2-9): Low Risk  (03/06/2021)   Depression (PHQ2-9)    PHQ-2 Score: 0  Financial Resource Strain: Low Risk  (03/06/2021)   Overall Financial Resource Strain (CARDIA)    Difficulty of Paying Living Expenses: Not hard at all  Food Insecurity: No Food Insecurity (03/06/2021)   Hunger Vital Sign    Worried About Running Out of Food in the Last Year: Never true    Ran Out of Food in  the Last Year: Never true  Housing: Low Risk  (03/06/2021)   Housing    Last Housing Risk Score: 0  Physical Activity: Inactive (03/06/2021)   Exercise Vital Sign    Days of Exercise per Week: 0 days    Minutes of Exercise per Session: 0 min  Social Connections: Moderately Isolated (03/06/2021)   Social Connection and Isolation Panel [NHANES]    Frequency of Communication with Friends and Family: More than three times a week    Frequency of Social Gatherings with Friends and Family: Twice a week    Attends Religious Services: More than 4 times per year    Active Member of [employer] or Organizations: No    Attends Archivist Meetings: Never    Marital Status: Widowed  Stress: No Stress Concern Present (03/06/2021)   [place]    Feeling of Stress : Not at all  Tobacco Use: Low Risk  (03/01/2022)   Patient History    Smoking Tobacco Use: Never    Smokeless Tobacco Use: Never    Passive Exposure: Not on file  Transportation Needs: No Transportation Needs (03/06/2021)   PRAPARE - Transportation    Lack of Transportation (Medical): No    Lack of Transportation (Non-Medical): No    CCM Care Plan  Allergies  Allergen Reactions   Prednisone Palpitations and Other (See Comments)    Doesn't work well for patient    Medications Reviewed Today     Reviewed by Casandra Doffing, CMA (Certified Medical Assistant) on 03/01/22 at 1416  Med List Status: <None>   Medication Order  Taking? Sig Documenting Provider Last Dose Status Informant  allopurinol (ZYLOPRIM) 100 MG tablet [id number] Yes TAKE 1 TABLET BY MOUTH EVERY MORNING Leamon Arnt, MD Taking Active   amLODipine (NORVASC) 10 MG tablet [id number] Yes TAKE 1 TABLET BY MOUTH EVERY DAY Leamon Arnt, MD Taking Active   ASPIRIN LOW DOSE 81 MG chewable tablet [id number] Yes CHEW ONE TABLET BY MOUTH EVERY DAY Leamon Arnt, MD Taking Active   atorvastatin (LIPITOR) 40 MG tablet [id number] Yes TAKE 1 TABLET BY MOUTH AT BEDTIME Leamon Arnt, MD Taking Active   diclofenac Sodium (VOLTAREN) 1 % GEL [id number] Yes Apply 4 GRAMS topically 4 TIMES daily AS NEEDED FOR PAIN Leamon Arnt, MD Taking Active   donepezil (ARICEPT ODT) 5 MG disintegrating tablet [id number] Yes Take 1 tablet (5 mg total) by mouth at bedtime. Leamon Arnt, MD Taking Active   FEROSUL 325 4795339021 Fe) MG tablet [id number] Yes TAKE 1 TABLET BY MOUTH 2 TIMES DAILY Leamon Arnt, MD Taking Active   furosemide (LASIX) 40 MG tablet [id number] Yes Take 1 tablet (40 mg total) by mouth daily.  Patient taking differently: Take 40 mg by mouth daily. Pt is taking 1x daily.   Leamon Arnt, MD Taking Active   gabapentin (NEURONTIN) 300 MG capsule [id number] No TAKE 2 CAPSULES BY MOUTH 3 TIMES DAILY  Patient not taking: Reported on 03/01/2022   Leamon Arnt, MD Not Taking Active   HYDROcodone-acetaminophen (NORCO/VICODIN) 5-325 MG tablet [id number] Yes TAKE 1 TABLET BY MOUTH EVERY MORNING AND AT BEDTIME Leamon Arnt, MD Taking Active   losartan (COZAAR) 50 MG tablet [id number] Yes TAKE 1 TABLET BY MOUTH EVERY DAY Leamon Arnt, MD Taking Active   Lutein 20 MG CAPS [id number] Yes TAKE 1 CAPSULE BY MOUTH EVERY DAY Leamon Arnt, MD Taking Active  magnesium oxide (MAG-OX) 400 (240 Mg) MG tablet [id number] Yes TAKE 1 TABLET BY MOUTH EVERY DAY Leamon Arnt, MD Taking Active   meclizine (ANTIVERT) 12.5 MG tablet [id number] Yes Take 1 tablet (12.5 mg total) by  mouth 2 (two) times daily as needed for dizziness. Leamon Arnt, MD Taking Active   metoprolol tartrate (LOPRESSOR) 25 MG tablet [id number] Yes TAKE 1 TABLET BY MOUTH 2 TIMES DAILY Leamon Arnt, MD Taking Active   omeprazole (PRILOSEC) 20 MG capsule [id number] Yes TAKE 1 CAPSULE BY MOUTH EVERY DAY BEFORE breakfast Leamon Arnt, MD Taking Active   triamcinolone (KENALOG) 0.1 % [id number] Yes Apply 1 application topically 2 (two) times daily. For 2 weeks, then as needed Leamon Arnt, MD Taking Active   vitamin C (ASCORBIC ACID) 500 MG tablet [id number] Yes TAKE 1 TABLET BY MOUTH EVERY DAY Leamon Arnt, MD Taking Active             Patient Active Problem List   Diagnosis Date Noted   Complete heart block ([place]) 09/21/2021   Gout 12/03/2020   Chronic venous insufficiency 10/17/2020   Vitamin B12 deficiency 02/06/2020   Lumbar stenosis 10/31/2019   Chronic narcotic dependence ([place]) 07/13/2019   Urinary incontinence in female, followed by GYN, did not tolerate pessary 06/27/2018   LBBB (left bundle branch block) 11/18/2016   PAD (peripheral artery disease) ([place]) 11/18/2016   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, CHB 08/20/2015    Immunization History  Administered Date(s) Administered   Fluad Quad(high Dose 65+) 06/19/2019, 05/09/2020, 06/09/2021   Influenza, High Dose Seasonal PF 06/30/2017, 05/12/2018   PFIZER(Purple  Top)SARS-COV-2 Vaccination 10/06/2019, 10/29/2019, 06/13/2020   Pfizer Covid-19 Vaccine Bivalent Booster 25yr & up 06/30/2021   Pneumococcal Conjugate-13 05/12/2018   Pneumococcal Polysaccharide-23 07/13/2019   Tdap 06/15/2018   Zoster Recombinat (Shingrix) 07/30/2019    Conditions to be addressed/monitored:  HTN, PAD, Type II DM, HLD, Gout  There are no care plans that you recently modified to display for this patient.     Medication Assistance: None required.  Patient affirms current coverage meets needs.  Compliance/Adherence/Medication fill history: Care Gaps: Flu vaccine  Star-Rating Drugs: Metformin 10063m08/29/22 28ds Atorvastatin 4057m8/29/22 28ds Losartan 34m74m/29/22 28ds  Patient's [facility] pharmacy is:  [facility]: [phone]: [phone] pill box? Yes - organized by [facility] Pt endorses 100% compliance  We discussed: Benefits of medication synchronization, packaging and delivery as well as enhanced pharmacist oversight with [facility]. Patient decided to: Continue current medication management strategy  Care Plan and Follow Up Patient Decision:  Patient agrees to Care Plan and Follow-up.  Plan: The care management team will reach out to the patient again over the next 180 days.  ChriBeverly MilcharmD Clinical Pharmacist ([phone] Barriers:  Minor swelling in extremities  Pharmacist Clinical Goal(s):  Patient will  maintain control of BP and glucose as evidenced by labs/screening  through collaboration with PharmD and provider.   Interventions: 1:1 collaboration with Leamon Arnt, MD regarding development and update of comprehensive plan of care as evidenced by provider attestation and co-signature Inter-disciplinary care team collaboration (see longitudinal plan of care) Comprehensive medication review performed;  medication list updated in electronic medical record  Hypertension (BP goal <140/90) -Controlled -Current treatment: Losartan 44m daily Amlodipine 127mdaily Metoprolol tartrate 2591mid -Medications previously tried: HCTZ, spironolactone  -Current home readings: 124/74 was most recent she could remember -Current dietary habits: not much of an appetite -Current exercise habits: minimal now, plans to start PT soon to be able to get up and down steps -Denies hypotensive/hypertensive symptoms -Educated on Daily salt intake goal < 2300 mg; Exercise goal of 150 minutes per week; Importance of home blood pressure monitoring; Symptoms of hypotension and importance of maintaining adequate hydration; -Counseled to monitor BP at home weekly, document, and provide log at future appointments -Recommended to continue current medication -Patient mentions some swelling in feet/ankles/kneed - not painful or red and does resolve over night or when she elevates feet.  Hyperlipidemia/PAD: (LDL goal < 70) -Controlled -Current treatment: ASA 24m44mily Clopidogrel 75mg24mly Atorvastatin 40mg 59my -Medications previously tried: none noted  -Current dietary patterns: see above -Current exercise habits: see above -Educated on Cholesterol goals;  Benefits of statin for ASCVD risk reduction; Importance of limiting foods high in cholesterol; Exercise goal of 150 minutes per week; -Most recent lipid panel excellent -Recommended to continue current medication  Diabetes (A1c goal <7%) -Controlled -Current medications: Metformin 1000mg t84m daily with a meal Gabapentin 300mg 2 62mules po tid -Medications previously tried: Januvia  -Current home glucose readings fasting glucose: 120s, patient could not find her logs today post prandial glucose: N/A -Denies hypoglycemic/hyperglycemic symptoms -Educated on A1c and blood sugar goals; Exercise goal of 150 minutes per week; Prevention and management  of hypoglycemic episodes; Counseled to check feet daily and get yearly eye exams -Counseled to check feet daily and get yearly eye exams -Recommended to continue current medication - denies any hypoglycemia  Gout (Goal: Control symptoms) -Controlled -Current treatment  Allopurinol 100mg -Me42mtions previously tried: none noted -No issues with gout as of late - continues adherence with meds  -Recommended to continue current medication  Patient Goals/Self-Care Activities Patient will:  - take medications as prescribed check glucose daily, document, and provide at future appointments check blood pressure daily, document, and provide at future appointments  Follow Up Plan: The care management team will reach out to the patient again over the next 180 days.

## 2022-03-02 LAB — IRON,TIBC AND FERRITIN PANEL
%SAT: 25 % (calc) (ref 16–45)
Ferritin: 274 ng/mL (ref 16–288)
Iron: 60 ug/dL (ref 45–160)
TIBC: 238 mcg/dL (calc) — ABNORMAL LOW (ref 250–450)

## 2022-03-02 LAB — CBC WITH DIFFERENTIAL/PLATELET
Absolute Monocytes: 589 cells/uL (ref 200–950)
Basophils Absolute: 19 cells/uL (ref 0–200)
Basophils Relative: 0.3 %
Eosinophils Absolute: 58 cells/uL (ref 15–500)
Eosinophils Relative: 0.9 %
HCT: 32.9 % — ABNORMAL LOW (ref 35.0–45.0)
Hemoglobin: 9.9 g/dL — ABNORMAL LOW (ref 11.7–15.5)
Lymphs Abs: 1613 cells/uL (ref 850–3900)
MCH: 24.4 pg — ABNORMAL LOW (ref 27.0–33.0)
MCHC: 30.1 g/dL — ABNORMAL LOW (ref 32.0–36.0)
MCV: 81.2 fL (ref 80.0–100.0)
MPV: 12.2 fL (ref 7.5–12.5)
Monocytes Relative: 9.2 %
Neutro Abs: 4122 cells/uL (ref 1500–7800)
Neutrophils Relative %: 64.4 %
Platelets: 331 10*3/uL (ref 140–400)
RBC: 4.05 10*6/uL (ref 3.80–5.10)
RDW: 14.9 % (ref 11.0–15.0)
Total Lymphocyte: 25.2 %
WBC: 6.4 10*3/uL (ref 3.8–10.8)

## 2022-03-02 LAB — COMPREHENSIVE METABOLIC PANEL
AG Ratio: 1.3 (calc) (ref 1.0–2.5)
ALT: 14 U/L (ref 6–29)
AST: 13 U/L (ref 10–35)
Albumin: 4.3 g/dL (ref 3.6–5.1)
Alkaline phosphatase (APISO): 72 U/L (ref 37–153)
BUN/Creatinine Ratio: 20 (calc) (ref 6–22)
BUN: 35 mg/dL — ABNORMAL HIGH (ref 7–25)
CO2: 23 mmol/L (ref 20–32)
Calcium: 9.9 mg/dL (ref 8.6–10.4)
Chloride: 100 mmol/L (ref 98–110)
Creat: 1.71 mg/dL — ABNORMAL HIGH (ref 0.60–0.95)
Globulin: 3.2 g/dL (calc) (ref 1.9–3.7)
Glucose, Bld: 128 mg/dL — ABNORMAL HIGH (ref 65–99)
Potassium: 4.7 mmol/L (ref 3.5–5.3)
Sodium: 137 mmol/L (ref 135–146)
Total Bilirubin: 0.4 mg/dL (ref 0.2–1.2)
Total Protein: 7.5 g/dL (ref 6.1–8.1)

## 2022-03-02 LAB — TSH: TSH: 2.95 mIU/L (ref 0.40–4.50)

## 2022-03-02 LAB — MAGNESIUM: Magnesium: 2.4 mg/dL (ref 1.5–2.5)

## 2022-03-04 ENCOUNTER — Encounter: Payer: Self-pay | Admitting: Family Medicine

## 2022-03-04 DIAGNOSIS — N189 Chronic kidney disease, unspecified: Secondary | ICD-10-CM | POA: Insufficient documentation

## 2022-03-05 ENCOUNTER — Other Ambulatory Visit: Payer: Self-pay

## 2022-03-05 DIAGNOSIS — E1140 Type 2 diabetes mellitus with diabetic neuropathy, unspecified: Secondary | ICD-10-CM

## 2022-03-05 DIAGNOSIS — N189 Chronic kidney disease, unspecified: Secondary | ICD-10-CM

## 2022-03-05 MED ORDER — DAPAGLIFLOZIN PROPANEDIOL 5 MG PO TABS: 5.0000 mg | ORAL_TABLET | Freq: Every day | ORAL | 5 refills | Status: DC

## 2022-03-08 NOTE — Telephone Encounter (Signed)
Message sent to pt thru MyChart 

## 2022-03-09 ENCOUNTER — Telehealth: Payer: Medicare Other

## 2022-03-09 ENCOUNTER — Other Ambulatory Visit: Payer: Self-pay | Admitting: Family Medicine

## 2022-03-18 ENCOUNTER — Telehealth: Payer: Self-pay | Admitting: Family Medicine

## 2022-03-18 NOTE — Telephone Encounter (Signed)
Caller states patient has dementia and caller does not know how to handle the Medicare AWV for 07/21.  Caller requests a call back regarding AWV and how to proceed.  Caller states she is at work, so please call and leave a message and she will return the call soon after.

## 2022-03-19 NOTE — Progress Notes (Signed)
This encounter was created in error - please disregard. Pt has dementia and is unable to comprehend an AWV and daughter works

## 2022-04-08 ENCOUNTER — Other Ambulatory Visit: Payer: Self-pay | Admitting: Family Medicine

## 2022-04-15 DIAGNOSIS — E1122 Type 2 diabetes mellitus with diabetic chronic kidney disease: Secondary | ICD-10-CM | POA: Diagnosis not present

## 2022-04-15 DIAGNOSIS — I129 Hypertensive chronic kidney disease with stage 1 through stage 4 chronic kidney disease, or unspecified chronic kidney disease: Secondary | ICD-10-CM | POA: Diagnosis not present

## 2022-04-15 DIAGNOSIS — N184 Chronic kidney disease, stage 4 (severe): Secondary | ICD-10-CM | POA: Diagnosis not present

## 2022-04-15 DIAGNOSIS — N1832 Chronic kidney disease, stage 3b: Secondary | ICD-10-CM | POA: Diagnosis not present

## 2022-04-15 DIAGNOSIS — D631 Anemia in chronic kidney disease: Secondary | ICD-10-CM | POA: Diagnosis not present

## 2022-04-15 DIAGNOSIS — E785 Hyperlipidemia, unspecified: Secondary | ICD-10-CM | POA: Diagnosis not present

## 2022-04-15 DIAGNOSIS — I442 Atrioventricular block, complete: Secondary | ICD-10-CM | POA: Diagnosis not present

## 2022-04-16 ENCOUNTER — Other Ambulatory Visit: Payer: Self-pay | Admitting: Internal Medicine

## 2022-04-16 DIAGNOSIS — N1832 Chronic kidney disease, stage 3b: Secondary | ICD-10-CM

## 2022-04-26 ENCOUNTER — Ambulatory Visit
Admission: RE | Admit: 2022-04-26 | Discharge: 2022-04-26 | Disposition: A | Discharge status: Home / Self Care | Payer: Medicare Other | Source: Ambulatory Visit | Attending: Internal Medicine | Admitting: Internal Medicine

## 2022-04-26 DIAGNOSIS — N1832 Chronic kidney disease, stage 3b: Secondary | ICD-10-CM

## 2022-04-26 DIAGNOSIS — N281 Cyst of kidney, acquired: Secondary | ICD-10-CM | POA: Diagnosis not present

## 2022-04-26 DIAGNOSIS — N189 Chronic kidney disease, unspecified: Secondary | ICD-10-CM | POA: Diagnosis not present

## 2022-04-26 DIAGNOSIS — R188 Other ascites: Secondary | ICD-10-CM | POA: Diagnosis not present

## 2022-05-05 ENCOUNTER — Other Ambulatory Visit: Payer: Self-pay | Admitting: Family Medicine

## 2022-05-11 DIAGNOSIS — I442 Atrioventricular block, complete: Secondary | ICD-10-CM | POA: Diagnosis not present

## 2022-05-11 DIAGNOSIS — Z45018 Encounter for adjustment and management of other part of cardiac pacemaker: Secondary | ICD-10-CM | POA: Diagnosis not present

## 2022-05-12 ENCOUNTER — Telehealth: Payer: Self-pay | Admitting: Family Medicine

## 2022-05-12 NOTE — Telephone Encounter (Signed)
Daughter has called to cancel annual well visit due to patient having dementia.    Daughter would like patient to be removed from call list to schedule any upcoming well visits.

## 2022-05-13 ENCOUNTER — Ambulatory Visit: Payer: Medicare Other

## 2022-05-13 ENCOUNTER — Telehealth: Payer: Self-pay | Admitting: Hematology and Oncology

## 2022-05-13 NOTE — Telephone Encounter (Signed)
Scheduled appt per 9/1 referral. Pt's daughter is aware of appt date and time. Pt's daughter is aware to arrive 15 mins prior to appt time and to bring and updated insurance card. Pt's daughter is aware of appt location.

## 2022-05-24 ENCOUNTER — Encounter: Payer: Self-pay | Admitting: *Deleted

## 2022-06-02 ENCOUNTER — Encounter: Payer: Self-pay | Admitting: Podiatry

## 2022-06-02 ENCOUNTER — Ambulatory Visit (INDEPENDENT_AMBULATORY_CARE_PROVIDER_SITE_OTHER): Payer: Medicare Other | Admitting: Podiatry

## 2022-06-02 DIAGNOSIS — M79674 Pain in right toe(s): Secondary | ICD-10-CM

## 2022-06-02 DIAGNOSIS — D689 Coagulation defect, unspecified: Secondary | ICD-10-CM

## 2022-06-02 DIAGNOSIS — M79675 Pain in left toe(s): Secondary | ICD-10-CM

## 2022-06-02 DIAGNOSIS — M2142 Flat foot [pes planus] (acquired), left foot: Secondary | ICD-10-CM

## 2022-06-02 DIAGNOSIS — B351 Tinea unguium: Secondary | ICD-10-CM | POA: Diagnosis not present

## 2022-06-02 DIAGNOSIS — E1142 Type 2 diabetes mellitus with diabetic polyneuropathy: Secondary | ICD-10-CM

## 2022-06-02 DIAGNOSIS — I739 Peripheral vascular disease, unspecified: Secondary | ICD-10-CM

## 2022-06-02 DIAGNOSIS — M2141 Flat foot [pes planus] (acquired), right foot: Secondary | ICD-10-CM

## 2022-06-02 NOTE — Progress Notes (Signed)
This patient returns to my office for at risk foot care.  This patient requires this care by a professional since this patient will be at risk due to having  Diabetes and PAD.  Patient is taking plavix.  This patient is unable to cut nails herself since the patient cannot reach her nails . These nails are painful walking and wearing shoes.  This patient presents for at risk foot care today.  General Appearance  Alert, conversant and in no acute stress.  Vascular  Dorsalis pedis and posterior tibial  pulses are absent bilaterally.  Capillary return is within normal limits  bilaterally. Cold feet  bilaterally.  Neurologic  Senn-Weinstein monofilament wire absent   bilaterally. Muscle power within normal limits bilaterally.  Nails Thick disfigured discolored nails with subungual debris  from hallux to fifth toes bilaterally. No evidence of bacterial infection or drainage bilaterally.  Orthopedic  No limitations of motion  feet .  No crepitus or effusions noted.  No bony pathology or digital deformities noted.  Skin  normotropic skin with no porokeratosis noted bilaterally.  No signs of infections or ulcers noted.     Onychomycosis  Pain in right toes  Pain in left toes  Consent was obtained for treatment procedures.   Mechanical debridement of nails 1-5  bilaterally performed with a nail nipper.  Filed with dremel without incident. No infection or ulcer.  Patient checks on the status of her diabetic shoes.   Return office visit  4  months        Told patient to return for periodic foot care and evaluation due to potential at risk complications.   Gardiner Barefoot DPM

## 2022-06-03 ENCOUNTER — Other Ambulatory Visit: Payer: Self-pay | Admitting: Family Medicine

## 2022-06-10 ENCOUNTER — Other Ambulatory Visit: Payer: Self-pay

## 2022-06-10 ENCOUNTER — Telehealth: Payer: Self-pay | Admitting: Family Medicine

## 2022-06-10 ENCOUNTER — Inpatient Hospital Stay: Payer: Medicare Other | Admitting: Hematology and Oncology

## 2022-06-10 ENCOUNTER — Inpatient Hospital Stay: Payer: Medicare Other

## 2022-06-10 NOTE — Telephone Encounter (Signed)
Patient states: -Podiatrist, Dr. Prudence Davidson, faxed over a form to confirm if PCP believes patient should have diabetic shoes   Patient requests: - A call if anything needs to be done on her end

## 2022-06-10 NOTE — Telephone Encounter (Signed)
  LAST APPOINTMENT DATE:  03/01/2022  NEXT APPOINTMENT DATE: 07/21/22  MEDICATION: HYDROcodone-acetaminophen (NORCO/VICODIN) 5-325 MG tablet   Is the patient out of medication? Yes  PHARMACY:  [facility] - [place], [place] - [address], [place] [address] Phone: [phone]  Fax: [phone]

## 2022-06-11 MED ORDER — HYDROCODONE-ACETAMINOPHEN 5-325 MG PO TABS
ORAL_TABLET | ORAL | 0 refills | Status: DC
Start: 2022-06-11 — End: 2022-09-17

## 2022-06-21 ENCOUNTER — Telehealth: Payer: Self-pay | Admitting: Family Medicine

## 2022-06-21 ENCOUNTER — Telehealth: Payer: Self-pay | Admitting: Podiatry

## 2022-06-21 NOTE — Telephone Encounter (Signed)
Forms has been faxed and pt has been notified thru [employer]

## 2022-06-21 NOTE — Telephone Encounter (Signed)
Spoke with pt to let her know that I have not received the paperwork for her Diabetic shoes. She stated that she will contact Dr. Burnell Blanks office to F/U on the papers.

## 2022-06-21 NOTE — Telephone Encounter (Signed)
Patient daughter [name] states a form for Diabetic Shoes will be faxed to PCP on 06/21/22. Requests form be filled out asap and faxed back to [facility] in order to meet deadline for Diabetic Shoes.

## 2022-06-21 NOTE — Telephone Encounter (Signed)
Pts daughter called asking if she could just pick up the forms for the pt to get her diabetic shoes. She has left multiple messages on the orthotic/diabetic shoe line and no one ever calls her back.  She stated that the pcp stated they sent the form over and upon checking they did but it was expired as it had been over the 6 month period that is required by medicare. I told her I could fax it in a few minutes or if she wanted I could print it and she could pick it up. Pts daughter asked me to fax it and I have and she is to call the office of the pcp to make sure they got it.

## 2022-06-23 ENCOUNTER — Telehealth: Payer: Self-pay | Admitting: Podiatry

## 2022-06-23 NOTE — Telephone Encounter (Signed)
Spoke with patients daughter and informed her that we have received the paperwork for her Dm shoes. Pt is going to come in the office to be recasted for her DM inserts as it has been over 6 months and the foam box has been discarded.

## 2022-06-23 NOTE — Telephone Encounter (Signed)
Please follow up with pt daughter. Thanks

## 2022-06-23 NOTE — Telephone Encounter (Signed)
Addressed.   Per Christan's note: Spoke with patients daughter and informed her that we have received the paperwork for her Dm shoes. Pt is going to come in the office to be recasted for her DM inserts as it has been over 6 months and the foam box has been discarded.

## 2022-06-24 ENCOUNTER — Ambulatory Visit: Payer: Medicare Other | Admitting: *Deleted

## 2022-06-24 DIAGNOSIS — E1142 Type 2 diabetes mellitus with diabetic polyneuropathy: Secondary | ICD-10-CM

## 2022-06-24 DIAGNOSIS — M2141 Flat foot [pes planus] (acquired), right foot: Secondary | ICD-10-CM

## 2022-06-24 NOTE — Progress Notes (Signed)
Christan re-molded patient for diabetic insoles today and will re-submit CMN to treating diabetic doctor

## 2022-06-28 DIAGNOSIS — I10 Essential (primary) hypertension: Secondary | ICD-10-CM | POA: Diagnosis not present

## 2022-06-28 DIAGNOSIS — E1169 Type 2 diabetes mellitus with other specified complication: Secondary | ICD-10-CM | POA: Diagnosis not present

## 2022-06-28 DIAGNOSIS — E1159 Type 2 diabetes mellitus with other circulatory complications: Secondary | ICD-10-CM | POA: Diagnosis not present

## 2022-06-28 DIAGNOSIS — E785 Hyperlipidemia, unspecified: Secondary | ICD-10-CM | POA: Diagnosis not present

## 2022-06-29 DIAGNOSIS — H93293 Other abnormal auditory perceptions, bilateral: Secondary | ICD-10-CM | POA: Diagnosis not present

## 2022-06-29 DIAGNOSIS — H6123 Impacted cerumen, bilateral: Secondary | ICD-10-CM | POA: Diagnosis not present

## 2022-07-14 ENCOUNTER — Other Ambulatory Visit: Payer: Self-pay | Admitting: Family Medicine

## 2022-07-14 DIAGNOSIS — E1140 Type 2 diabetes mellitus with diabetic neuropathy, unspecified: Secondary | ICD-10-CM

## 2022-07-21 ENCOUNTER — Ambulatory Visit (INDEPENDENT_AMBULATORY_CARE_PROVIDER_SITE_OTHER): Payer: Medicare Other | Admitting: Family Medicine

## 2022-07-21 ENCOUNTER — Encounter: Payer: Self-pay | Admitting: Family Medicine

## 2022-07-21 VITALS — BP 138/62 | HR 76 | Temp 97.6°F | Ht 63.0 in | Wt 190.2 lb

## 2022-07-21 DIAGNOSIS — E1140 Type 2 diabetes mellitus with diabetic neuropathy, unspecified: Secondary | ICD-10-CM

## 2022-07-21 DIAGNOSIS — E1121 Type 2 diabetes mellitus with diabetic nephropathy: Secondary | ICD-10-CM

## 2022-07-21 DIAGNOSIS — N1832 Chronic kidney disease, stage 3b: Secondary | ICD-10-CM

## 2022-07-21 DIAGNOSIS — M48062 Spinal stenosis, lumbar region with neurogenic claudication: Secondary | ICD-10-CM

## 2022-07-21 DIAGNOSIS — M5442 Lumbago with sciatica, left side: Secondary | ICD-10-CM

## 2022-07-21 DIAGNOSIS — E1159 Type 2 diabetes mellitus with other circulatory complications: Secondary | ICD-10-CM

## 2022-07-21 DIAGNOSIS — I152 Hypertension secondary to endocrine disorders: Secondary | ICD-10-CM

## 2022-07-21 DIAGNOSIS — Z23 Encounter for immunization: Secondary | ICD-10-CM

## 2022-07-21 DIAGNOSIS — D638 Anemia in other chronic diseases classified elsewhere: Secondary | ICD-10-CM | POA: Diagnosis not present

## 2022-07-21 DIAGNOSIS — G8929 Other chronic pain: Secondary | ICD-10-CM | POA: Diagnosis not present

## 2022-07-21 DIAGNOSIS — M5441 Lumbago with sciatica, right side: Secondary | ICD-10-CM | POA: Diagnosis not present

## 2022-07-21 LAB — POCT GLYCOSYLATED HEMOGLOBIN (HGB A1C): Hemoglobin A1C: 7.3 % — AB (ref 4.0–5.6)

## 2022-07-21 NOTE — Progress Notes (Signed)
Subjective  CC:  Chief Complaint  Patient presents with   Diabetes    Pt stated that farxiga is too high costing her $180    Pain Management   Hypertension    HPI: Krista Clarke is a 81 y.o. female who presents to the office today for follow up of diabetes and problems listed above in the chief complaint.  Diabetes follow up: Her diabetic control is reported as Unchanged. Now on farxiga 5 x about 4 months and tolerating well. Just filled: cost increased to $180. (Likely in donut hole).  She denies exertional CP or SOB or symptomatic hypoglycemia. She denies foot sores or paresthesias.  HTN: has been controlled. No chest pain.  CKD: reviewed renal consult from August and labs. Stage 3b, on losartan and farxiga. Sodium and renal function had improved. No new recs. Avoid nsaids and neurotoxins. 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  Chronic disease anemia is stable. Overall feeling much better.  Chronic back pain remains well controlled. Using less narcotic. I reviewed patient's records from the PMP aware controlled substance registry today.  Flu shot today Wt Readings from Last 3 Encounters:  07/21/22 190 lb 3.2 oz (86.3 kg)  03/01/22 178 lb 12.8 oz (81.1 kg)  01/18/22 190 lb 12.8 oz (86.5 kg)    BP Readings from Last 3 Encounters:  07/21/22 138/62  03/01/22 (!) 130/52  02/26/22 (!) 146/79    Assessment  1. Type 2 diabetes, controlled, with neuropathy ([place])   2. Hypertension associated with diabetes ([place])   3. Stage 3b chronic kidney disease ([place])   4. Chronic disease anemia   5. Spinal stenosis of lumbar region with neurogenic claudication   6. Chronic midline low back pain with bilateral sciatica   7. Need for immunization against influenza   8. Diabetic nephropathy associated with type 2 diabetes mellitus ([place])      Plan  Diabetes is currently well controlled. Goal A1c < 8.0 given age and comorbidities. Will see if cost of farxiga goes back  down at the start of the new year. Contineu farxiga 5 daily. Recheck 3 months. Can increase to 10 if needed at that time. Eats well.  Continue norco for back pain. Monitor use. Stable.  Bp is well controlled on multiple meds. No changes today. Renal function and sodium/potassium are stable.  CKD: nephrology consulted and reassuring overall.  Follow up: 3 mo for dm. Orders Placed This Encounter  Procedures   Flu Vaccine QUAD High Dose(Fluad)   POCT HgB A1C   No orders of the defined types were placed in this encounter.     Immunization History  Administered Date(s) Administered   Fluad Quad(high Dose 65+) 06/19/2019, 05/09/2020, 06/09/2021, 07/21/2022   Influenza, High Dose Seasonal PF 06/30/2017, 05/12/2018   PFIZER(Purple Top)SARS-COV-2 Vaccination 10/06/2019, 10/29/2019, 06/13/2020   Pfizer Covid-19 Vaccine Bivalent Booster 61yrs & up 06/30/2021   Pneumococcal Conjugate-13 05/12/2018   Pneumococcal Polysaccharide-23 07/13/2019   Tdap 06/15/2018   Zoster Recombinat (Shingrix) 07/30/2019, 01/17/2020    Diabetes Related Lab Review: Lab Results  Component Value Date   HGBA1C 7.3 (A) 07/21/2022   HGBA1C 6.7 (H) 01/18/2022   HGBA1C 6.7 (H) 09/21/2021    Lab Results  Component Value Date   MICROALBUR 2.4 (H) 01/18/2022   Lab Results  Component Value Date   CREATININE 1.71 (H) 03/01/2022   BUN 35 (H) 03/01/2022   NA 137 03/01/2022   K 4.7 03/01/2022   CL 100 03/01/2022   CO2  23 03/01/2022   Lab Results  Component Value Date   CHOL 120 01/18/2022   CHOL 126 12/03/2020   CHOL 142 02/06/2020   Lab Results  Component Value Date   HDL 49.90 01/18/2022   HDL 49.90 12/03/2020   HDL 51.80 02/06/2020   Lab Results  Component Value Date   LDLCALC 45 01/18/2022   LDLCALC 50 12/03/2020   LDLCALC 61 02/06/2020   Lab Results  Component Value Date   TRIG 124.0 01/18/2022   TRIG 127.0 12/03/2020   TRIG 143.0 02/06/2020   Lab Results  Component Value Date   CHOLHDL 2  01/18/2022   CHOLHDL 3 12/03/2020   CHOLHDL 3 02/06/2020   No results found for: "LDLDIRECT" The ASCVD Risk score (Arnett DK, et al., 2019) failed to calculate for the following reasons:   The 2019 ASCVD risk score is only valid for ages 61 to 42 I have reviewed the [place], Fam and Soc history. Patient Active Problem List   Diagnosis Date Noted   Lumbar stenosis 10/31/2019    Priority: High    Dr. Marlou Sa 2010 lumbar MRI, multilevel and severe. Failed cymbalta, PT, lyrica and gabapentin. Treated at [facility] with epidural spinal injections    Chronic narcotic dependence ([place]) 07/13/2019    Priority: High   PAD (peripheral artery disease) ([place]) 11/18/2016    Priority: High    MILD: LE [place] on 04/29/16, mild arterial occlusive disease. Mild right iliac arterial disease and mild left distal popliteal disease. Right ABI 1.06, Left ABI 1.07.    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, CHB 08/20/2015    Priority: High    Remote dual-chamber pacemaker transmission 05/12/2022: AP 37%, VP 99%.  Longevity 3 years and 10 months.  Lead impedance and thresholds normal.  No significant mode switches, normal pacemaker function.  Scheduled  In office pacemaker check 07/08/2021 Single (S)/Dual (D)/BV: D. Presenting ASVP @ 80/min. Pacemaker dependant:  Yes. Underlying CHB. AP 2%, VP 100% AMS Episodes 1, brief AT.   AT/AF burden <1% .  HVR 0  Longevity 5 Years. Magnet rate: >85%. Lead measurements: Stable. Histogram: Low (L)/normal (N)/high (H)  Normal. Patient activity Good.   Observations: Normal pacemaker function. Changes: None.     Gout 12/03/2020    Priority: Medium    Chronic venous insufficiency 10/17/2020    Priority: Medium    Urinary incontinence in female, followed by GYN, did not tolerate pessary 06/27/2018    Priority: Medium    LBBB (left bundle branch block) 11/18/2016    Priority: Medium     EKG 07/08/16: Sinus rhythm , rate 80, left axis deviation, left anterior fascicular block, left bundle branch block.    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 She has never used smokeless tobacco. She reports that she does not drink alcohol and does not use drugs.  Review of Systems: Ophthalmic: negative for eye pain, loss of vision or double vision Cardiovascular: negative for chest pain Respiratory: negative for SOB or persistent cough Gastrointestinal: negative for abdominal pain Genitourinary: negative for dysuria or gross hematuria MSK: negative for foot lesions Neurologic: negative for weakness or gait disturbance  Objective  Vitals: BP 138/62   Pulse 76   Temp 97.6 F (36.4 C)   Ht 5\' 3"  (1.6 m)   Wt 190 lb 3.2 oz (86.3 kg)   LMP  (LMP Unknown)   SpO2 98%   BMI 33.69 kg/m  General: well appearing, no acute distress  Psych:  Alert and oriented, normal mood and affect HEENT:  Normocephalic,  atraumatic, moist mucous membranes, supple neck  Cardiovascular:  Nl S1 and S2, RRR without murmur, gallop or rub. no edema Respiratory:  Good breath sounds bilaterally, CTAB with normal effort, no rales +1 nonpitting edema bilaterally    Diabetic education: ongoing education regarding chronic disease management for diabetes was given today. We continue to reinforce the ABC's of diabetic management: A1c (<7 or 8 dependent upon patient), tight blood pressure control, and cholesterol management with goal LDL < 100 minimally. We discuss diet strategies, exercise recommendations, medication options and possible side effects. At each visit, we review recommended immunizations and preventive care recommendations for diabetics and stress that good diabetic control can prevent other problems. See below for this patient's data.   Commons side effects, risks, benefits, and alternatives for medications and treatment plan prescribed today were discussed, and the patient expressed understanding of the given instructions. Patient is instructed to call or message via MyChart if he/she has any questions or concerns regarding our treatment plan. No barriers to understanding were identified. We discussed Red Flag symptoms and signs in detail. Patient expressed understanding regarding what to do in case of urgent or emergency type symptoms.  Medication list was reconciled, printed and provided to the patient in AVS. Patient instructions and summary information was reviewed with the patient as documented in the AVS. This note was prepared with assistance of Dragon voice recognition software. Occasional wrong-word or sound-a-like substitutions may have occurred due to the inherent limitations of voice recognition software

## 2022-07-21 NOTE — Patient Instructions (Signed)
Please return in 3 months for diabetes follow up   Let me know if you have trouble with the [place] coverage in January   If you have any questions or concerns, please don't hesitate to send me a message via MyChart or call the office at [phone]. Thank you for visiting with [place] today! It's our pleasure caring for you.

## 2022-07-23 ENCOUNTER — Other Ambulatory Visit: Payer: Self-pay | Admitting: Family Medicine

## 2022-08-03 ENCOUNTER — Encounter: Payer: Self-pay | Admitting: Family Medicine

## 2022-08-10 DIAGNOSIS — I442 Atrioventricular block, complete: Secondary | ICD-10-CM | POA: Diagnosis not present

## 2022-08-10 DIAGNOSIS — Z45018 Encounter for adjustment and management of other part of cardiac pacemaker: Secondary | ICD-10-CM | POA: Diagnosis not present

## 2022-08-17 ENCOUNTER — Telehealth: Payer: Self-pay | Admitting: Family Medicine

## 2022-08-17 NOTE — Telephone Encounter (Signed)
Caller states: - Patient will be stopping her farxiga in 7 days as pt discussed with PCP - Will be holding medication up until patient's f/u with PCP on 10/22/22 - She wanted to let PCP know

## 2022-09-16 ENCOUNTER — Other Ambulatory Visit: Payer: Self-pay | Admitting: Family Medicine

## 2022-09-28 ENCOUNTER — Ambulatory Visit (INDEPENDENT_AMBULATORY_CARE_PROVIDER_SITE_OTHER): Payer: Medicare Other

## 2022-09-28 DIAGNOSIS — E1142 Type 2 diabetes mellitus with diabetic polyneuropathy: Secondary | ICD-10-CM | POA: Diagnosis not present

## 2022-09-28 DIAGNOSIS — M2142 Flat foot [pes planus] (acquired), left foot: Secondary | ICD-10-CM | POA: Diagnosis not present

## 2022-09-28 DIAGNOSIS — M2141 Flat foot [pes planus] (acquired), right foot: Secondary | ICD-10-CM

## 2022-09-28 NOTE — Progress Notes (Signed)
Patient presents today to pick up diabetic shoes and insoles.  Patient was dispensed 1 pair of diabetic shoes and 3 pairs of foam casted diabetic insoles.   She tried on the shoes with the insoles and the fit was satisfactory.   Will follow up next year for new order.

## 2022-09-30 DIAGNOSIS — E1159 Type 2 diabetes mellitus with other circulatory complications: Secondary | ICD-10-CM | POA: Diagnosis not present

## 2022-09-30 DIAGNOSIS — E1169 Type 2 diabetes mellitus with other specified complication: Secondary | ICD-10-CM | POA: Diagnosis not present

## 2022-09-30 DIAGNOSIS — E785 Hyperlipidemia, unspecified: Secondary | ICD-10-CM | POA: Diagnosis not present

## 2022-09-30 DIAGNOSIS — I10 Essential (primary) hypertension: Secondary | ICD-10-CM | POA: Diagnosis not present

## 2022-10-04 ENCOUNTER — Ambulatory Visit (INDEPENDENT_AMBULATORY_CARE_PROVIDER_SITE_OTHER): Payer: Medicare Other | Admitting: Podiatry

## 2022-10-04 ENCOUNTER — Encounter: Payer: Self-pay | Admitting: Podiatry

## 2022-10-04 VITALS — BP 153/62 | HR 84

## 2022-10-04 DIAGNOSIS — M79675 Pain in left toe(s): Secondary | ICD-10-CM | POA: Diagnosis not present

## 2022-10-04 DIAGNOSIS — M79674 Pain in right toe(s): Secondary | ICD-10-CM | POA: Diagnosis not present

## 2022-10-04 DIAGNOSIS — B351 Tinea unguium: Secondary | ICD-10-CM

## 2022-10-04 DIAGNOSIS — E1142 Type 2 diabetes mellitus with diabetic polyneuropathy: Secondary | ICD-10-CM

## 2022-10-04 NOTE — Progress Notes (Signed)
This patient returns to my office for at risk foot care.  This patient requires this care by a professional since this patient will be at risk due to having  Diabetes and PAD.  Patient is taking plavix.  This patient is unable to cut nails herself since the patient cannot reach her nails . These nails are painful walking and wearing shoes.  This patient presents for at risk foot care today.  General Appearance  Alert, conversant and in no acute stress.  Vascular  Dorsalis pedis and posterior tibial  pulses are absent bilaterally.  Capillary return is within normal limits  bilaterally. Cold feet  bilaterally.  Neurologic  Senn-Weinstein monofilament wire absent   bilaterally. Muscle power within normal limits bilaterally.  Nails Thick disfigured discolored nails with subungual debris  from hallux to fifth toes bilaterally. No evidence of bacterial infection or drainage bilaterally.  Orthopedic  No limitations of motion  feet .  No crepitus or effusions noted.  No bony pathology or digital deformities noted.  Skin  normotropic skin with no porokeratosis noted bilaterally.  No signs of infections or ulcers noted.     Onychomycosis  Pain in right toes  Pain in left toes  Consent was obtained for treatment procedures.   Mechanical debridement of nails 1-5  bilaterally performed with a nail nipper.  Filed with dremel without incident. No infection or ulcer.     Return office visit  4  months        Told patient to return for periodic foot care and evaluation due to potential at risk complications.   Gardiner Barefoot DPM

## 2022-10-15 DIAGNOSIS — H25813 Combined forms of age-related cataract, bilateral: Secondary | ICD-10-CM | POA: Diagnosis not present

## 2022-10-15 DIAGNOSIS — H52203 Unspecified astigmatism, bilateral: Secondary | ICD-10-CM | POA: Diagnosis not present

## 2022-10-15 DIAGNOSIS — H02834 Dermatochalasis of left upper eyelid: Secondary | ICD-10-CM | POA: Diagnosis not present

## 2022-10-15 DIAGNOSIS — H02403 Unspecified ptosis of bilateral eyelids: Secondary | ICD-10-CM | POA: Diagnosis not present

## 2022-10-15 DIAGNOSIS — H02831 Dermatochalasis of right upper eyelid: Secondary | ICD-10-CM | POA: Diagnosis not present

## 2022-10-15 DIAGNOSIS — E119 Type 2 diabetes mellitus without complications: Secondary | ICD-10-CM | POA: Diagnosis not present

## 2022-10-15 LAB — HM DIABETES EYE EXAM

## 2022-10-18 ENCOUNTER — Other Ambulatory Visit: Payer: Self-pay | Admitting: Family Medicine

## 2022-10-22 ENCOUNTER — Ambulatory Visit (INDEPENDENT_AMBULATORY_CARE_PROVIDER_SITE_OTHER): Payer: Medicare Other | Admitting: Family Medicine

## 2022-10-22 ENCOUNTER — Encounter: Payer: Self-pay | Admitting: Family Medicine

## 2022-10-22 VITALS — BP 150/70 | HR 67 | Temp 97.9°F | Ht 63.0 in | Wt 199.2 lb

## 2022-10-22 DIAGNOSIS — M5442 Lumbago with sciatica, left side: Secondary | ICD-10-CM | POA: Diagnosis not present

## 2022-10-22 DIAGNOSIS — F1120 Opioid dependence, uncomplicated: Secondary | ICD-10-CM | POA: Diagnosis not present

## 2022-10-22 DIAGNOSIS — M5441 Lumbago with sciatica, right side: Secondary | ICD-10-CM | POA: Diagnosis not present

## 2022-10-22 DIAGNOSIS — E1140 Type 2 diabetes mellitus with diabetic neuropathy, unspecified: Secondary | ICD-10-CM

## 2022-10-22 DIAGNOSIS — E1159 Type 2 diabetes mellitus with other circulatory complications: Secondary | ICD-10-CM | POA: Diagnosis not present

## 2022-10-22 DIAGNOSIS — R269 Unspecified abnormalities of gait and mobility: Secondary | ICD-10-CM | POA: Diagnosis not present

## 2022-10-22 DIAGNOSIS — G8929 Other chronic pain: Secondary | ICD-10-CM

## 2022-10-22 DIAGNOSIS — I152 Hypertension secondary to endocrine disorders: Secondary | ICD-10-CM | POA: Diagnosis not present

## 2022-10-22 LAB — POCT GLYCOSYLATED HEMOGLOBIN (HGB A1C): Hemoglobin A1C: 7.8 % — AB (ref 4.0–5.6)

## 2022-10-22 MED ORDER — LOSARTAN POTASSIUM 100 MG PO TABS: 100.0000 mg | ORAL_TABLET | Freq: Every day | ORAL | 3 refills | Status: DC

## 2022-10-22 MED ORDER — DAPAGLIFLOZIN PROPANEDIOL 5 MG PO TABS: 5.0000 mg | ORAL_TABLET | Freq: Every day | ORAL | 3 refills | Status: DC

## 2022-10-22 MED ORDER — HYDROCODONE-ACETAMINOPHEN 5-325 MG PO TABS
1.0000 | ORAL_TABLET | Freq: Two times a day (BID) | ORAL | 0 refills | Status: DC
Start: 2022-10-22 — End: 2022-12-21

## 2022-10-22 MED ORDER — GABAPENTIN 100 MG PO CAPS: 100.0000 mg | ORAL_CAPSULE | Freq: Two times a day (BID) | ORAL | 5 refills | Status: DC

## 2022-10-22 NOTE — Progress Notes (Unsigned)
Subjective  CC: f/u diabetes  HPI: Krista Clarke is a 82 y.o. female who presents to the office today for follow up of diabetes and problems listed above in the chief complaint.  Diabetes follow up: Her diabetic control is reported as Worse. Has not yet restarted farxiga; had to stop it last year due to cost (medicare donut hole). Weight is up. Less active. Diet is fair.  She denies exertional CP or SOB or symptomatic hypoglycemia. She denies foot sores but foot paresthesias are worsening and interfering with her sleep.  HTN: bp is running high. No cp Chronic pain and back pain: persists. Norco bid.  Difficult to walk due to feeling unsteady. No falls.   Wt Readings from Last 3 Encounters:  10/22/22 199 lb 3.2 oz (90.4 kg)  07/21/22 190 lb 3.2 oz (86.3 kg)  03/01/22 178 lb 12.8 oz (81.1 kg)    BP Readings from Last 3 Encounters:  10/22/22 (!) 150/70  10/04/22 (!) 153/62  07/21/22 138/62    Assessment  1. Type 2 diabetes, controlled, with neuropathy ([place])   2. Hypertension associated with diabetes ([place])   3. Chronic midline low back pain with bilateral sciatica   4. Chronic narcotic dependence ([place])   5. Abnormal gait      Plan  Diabetes is currently marginally controlled. Will add back farxiga 5 daily.  Neuropathy: discussed use of gabapentin. Start low dose; titrate up. Should help with sleep as well.  Continue norco. Add home PT.  HTN: increase losartan to '100mg'$  daily.   Follow up: 3 mo for cpe. Orders Placed This Encounter  Procedures   Ambulatory referral to [facility]   POCT HgB A1C   Meds ordered this encounter  Medications   losartan (COZAAR) 100 MG tablet    Sig: Take 1 tablet (100 mg total) by mouth daily.    Dispense:  90 tablet    Refill:  3   dapagliflozin propanediol (FARXIGA) 5 MG TABS tablet    Sig: Take 1 tablet (5 mg total) by mouth daily.    Dispense:  90 tablet    Refill:  3   gabapentin (NEURONTIN) 100 MG capsule    Sig: Take 1 capsule (100  mg total) by mouth 2 (two) times daily.    Dispense:  60 capsule    Refill:  5   HYDROcodone-acetaminophen (NORCO/VICODIN) 5-325 MG tablet    Sig: Take 1 tablet by mouth in the morning and at bedtime.    Dispense:  60 tablet    Refill:  0      Immunization History  Administered Date(s) Administered   Fluad Quad(high Dose 65+) 06/19/2019, 05/09/2020, 06/09/2021, 07/21/2022   Influenza, High Dose Seasonal PF 06/30/2017, 05/12/2018   PFIZER(Purple Top)SARS-COV-2 Vaccination 10/06/2019, 10/29/2019, 06/13/2020   Pfizer Covid-19 Vaccine Bivalent Booster 34yr & up 06/30/2021, 08/07/2022   Pneumococcal Conjugate-13 05/12/2018   Pneumococcal Polysaccharide-23 07/13/2019   RSV,unspecified 08/07/2022   Tdap 06/15/2018   Zoster Recombinat (Shingrix) 07/30/2019, 01/17/2020    Diabetes Related Lab Review: Lab Results  Component Value Date   HGBA1C 7.8 (A) 10/22/2022   HGBA1C 7.3 (A) 07/21/2022   HGBA1C 6.7 (H) 01/18/2022    Lab Results  Component Value Date   MICROALBUR 2.4 (H) 01/18/2022   Lab Results  Component Value Date   CREATININE 1.71 (H) 03/01/2022   BUN 35 (H) 03/01/2022   NA 137 03/01/2022   K 4.7 03/01/2022   CL 100 03/01/2022   CO2 23 03/01/2022  Lab Results  Component Value Date   CHOL 120 01/18/2022   CHOL 126 12/03/2020   CHOL 142 02/06/2020   Lab Results  Component Value Date   HDL 49.90 01/18/2022   HDL 49.90 12/03/2020   HDL 51.80 02/06/2020   Lab Results  Component Value Date   LDLCALC 45 01/18/2022   LDLCALC 50 12/03/2020   LDLCALC 61 02/06/2020   Lab Results  Component Value Date   TRIG 124.0 01/18/2022   TRIG 127.0 12/03/2020   TRIG 143.0 02/06/2020   Lab Results  Component Value Date   CHOLHDL 2 01/18/2022   CHOLHDL 3 12/03/2020   CHOLHDL 3 02/06/2020   No results found for: "LDLDIRECT" The ASCVD Risk score (Arnett DK, et al., 2019) failed to calculate for the following reasons:   The 2019 ASCVD risk score is only valid for ages 75  to 70 I have reviewed the [place], Fam and Soc history. Patient Active Problem List   Diagnosis Date Noted   Lumbar stenosis 10/31/2019    Priority: High    Dr. Marlou Sa 2010 lumbar MRI, multilevel and severe. Failed cymbalta, PT, lyrica and gabapentin. Treated at [facility] with epidural spinal injections    Chronic narcotic dependence ([place]) 07/13/2019    Priority: High   PAD (peripheral artery disease) ([place]) 11/18/2016    Priority: High    MILD: LE [place] on 04/29/16, mild arterial occlusive disease. Mild right iliac arterial disease and mild left distal popliteal disease. Right ABI 1.06, Left ABI 1.07.    Chronic midline low back pain with bilateral sciatica 10/02/2016    Priority: High   Severe obesity (BMI 35.0-35.9 with comorbidity) ([place]) 10/02/2016    Priority: High   Hyperlipidemia associated with type 2 diabetes mellitus ([place]) 10/02/2016    Priority: High   Hypertension associated with diabetes ([place]) 03/08/2016    Priority: High   Type 2 diabetes, controlled, with neuropathy ([place]) 03/08/2016    Priority: High   History of complete heart block 08/20/2015    Priority: High   Pacemaker; 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  AP 32%, VP 99%.  There were no high ventricular rate episodes, no mode switches. Lead impedance and thresholds are normal.  Normal pacemaker function.   Scheduled  In office pacemaker check 07/08/2021 Single (S)/Dual (D)/BV: D. Presenting ASVP @ 80/min. Pacemaker dependant:  Yes. Underlying CHB. AP 2%, VP 100% AMS Episodes 1, brief AT.  AT/AF burden <1% .  HVR 0  Longevity 5 Years. Magnet rate: >85%. Lead measurements: Stable. Histogram: Low (L)/normal (N)/high (H)  Normal. Patient activity Good.   Observations: Normal pacemaker function. Changes: None.     Gout 12/03/2020    Priority: Medium     Chronic venous insufficiency 10/17/2020    Priority: Medium    Urinary incontinence in female, followed by GYN, did not tolerate pessary 06/27/2018    Priority: Medium    LBBB (left bundle branch block) 11/18/2016    Priority: Medium     EKG 07/08/16: Sinus rhythm , rate 80, left axis deviation, left anterior fascicular block, left bundle branch block.    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 She has never used smokeless tobacco. She reports that she does not drink alcohol and does not use drugs.  Review of Systems: Ophthalmic: negative for eye pain, loss of vision or double vision Cardiovascular: negative for chest pain Respiratory: negative for SOB or persistent cough Gastrointestinal: negative for abdominal pain Genitourinary: negative for dysuria or gross hematuria MSK: negative for foot lesions Neurologic: negative for weakness or gait disturbance  Objective  Vitals: BP (!) 150/70   Pulse 67   Temp 97.9 F (36.6 C)   Ht '5\' 3"'$  (1.6 m)   Wt 199 lb 3.2 oz (90.4 kg)   LMP  (LMP Unknown)   SpO2 98%   BMI 35.29 kg/m  General: well appearing, no acute distress  Psych:  Alert and oriented, normal mood and affect HEENT:  Normocephalic, atraumatic, moist mucous membranes, supple neck  Cardiovascular:  Nl S1 and S2, RRR without murmur, gallop or rub.  Respiratory:  Good breath sounds bilaterally, CTAB with normal effort, no rales Tr edema bilaterally    Diabetic education: ongoing education regarding  chronic disease management for diabetes was given today. We continue to reinforce the ABC's of diabetic management: A1c (<7 or 8 dependent upon patient), tight blood pressure control, and cholesterol management with goal LDL < 100 minimally. We discuss diet strategies, exercise recommendations, medication options and possible side effects. At each visit, we review recommended immunizations and preventive care recommendations for diabetics and stress that good diabetic control can prevent other problems. See below for this patient's data.   Commons side effects, risks, benefits, and alternatives for medications and treatment plan prescribed today were discussed, and the patient expressed understanding of the given instructions. Patient is instructed to call or message via MyChart if he/she has any questions or concerns regarding our treatment plan. No barriers to understanding were identified. We discussed Red Flag symptoms and signs in detail. Patient expressed understanding regarding what to do in case of urgent or emergency type symptoms.  Medication list was reconciled, printed and provided to the patient in AVS. Patient instructions and summary information was reviewed with the patient as documented in the AVS. This note was prepared with assistance of Dragon voice recognition software. Occasional wrong-word or sound-a-like substitutions may have occurred due to the inherent limitations of voice recognition software

## 2022-10-22 NOTE — Patient Instructions (Signed)
Please return in 3 months for your annual complete physical; please come fasting.   If you have any questions or concerns, please don't hesitate to send me a message via MyChart or call the office at [phone]. Thank you for visiting with [place] today! It's our pleasure caring for you.

## 2022-10-25 DIAGNOSIS — M5442 Lumbago with sciatica, left side: Secondary | ICD-10-CM | POA: Diagnosis not present

## 2022-10-25 DIAGNOSIS — E1140 Type 2 diabetes mellitus with diabetic neuropathy, unspecified: Secondary | ICD-10-CM | POA: Diagnosis not present

## 2022-10-25 DIAGNOSIS — E1169 Type 2 diabetes mellitus with other specified complication: Secondary | ICD-10-CM | POA: Diagnosis not present

## 2022-10-25 DIAGNOSIS — I872 Venous insufficiency (chronic) (peripheral): Secondary | ICD-10-CM | POA: Diagnosis not present

## 2022-10-25 DIAGNOSIS — M5441 Lumbago with sciatica, right side: Secondary | ICD-10-CM | POA: Diagnosis not present

## 2022-10-25 DIAGNOSIS — E1151 Type 2 diabetes mellitus with diabetic peripheral angiopathy without gangrene: Secondary | ICD-10-CM | POA: Diagnosis not present

## 2022-10-26 ENCOUNTER — Telehealth: Payer: Self-pay | Admitting: Family Medicine

## 2022-10-26 NOTE — Telephone Encounter (Signed)
Home Health Verbal Orders  Agency:  [facility]  Caller: [place] and title  Requesting OT/ PT/ Skilled nursing/ Social Work/ Speech:    Reason for Request:  Request for order for [facility] PT, 2 time for 3 week, and once for 3 week, to work for gait, balance, strength  Frequency:    HH needs F2F w/in last 30 days    [phone]

## 2022-10-27 NOTE — Telephone Encounter (Signed)
LVM for [place] to move forward with the verbal orders for pt.

## 2022-11-01 DIAGNOSIS — E1151 Type 2 diabetes mellitus with diabetic peripheral angiopathy without gangrene: Secondary | ICD-10-CM | POA: Diagnosis not present

## 2022-11-01 DIAGNOSIS — M5441 Lumbago with sciatica, right side: Secondary | ICD-10-CM | POA: Diagnosis not present

## 2022-11-01 DIAGNOSIS — E1140 Type 2 diabetes mellitus with diabetic neuropathy, unspecified: Secondary | ICD-10-CM | POA: Diagnosis not present

## 2022-11-01 DIAGNOSIS — E1169 Type 2 diabetes mellitus with other specified complication: Secondary | ICD-10-CM | POA: Diagnosis not present

## 2022-11-01 DIAGNOSIS — M5442 Lumbago with sciatica, left side: Secondary | ICD-10-CM | POA: Diagnosis not present

## 2022-11-01 DIAGNOSIS — I872 Venous insufficiency (chronic) (peripheral): Secondary | ICD-10-CM | POA: Diagnosis not present

## 2022-11-04 DIAGNOSIS — M5441 Lumbago with sciatica, right side: Secondary | ICD-10-CM | POA: Diagnosis not present

## 2022-11-04 DIAGNOSIS — E1140 Type 2 diabetes mellitus with diabetic neuropathy, unspecified: Secondary | ICD-10-CM | POA: Diagnosis not present

## 2022-11-04 DIAGNOSIS — I872 Venous insufficiency (chronic) (peripheral): Secondary | ICD-10-CM | POA: Diagnosis not present

## 2022-11-04 DIAGNOSIS — E1169 Type 2 diabetes mellitus with other specified complication: Secondary | ICD-10-CM | POA: Diagnosis not present

## 2022-11-04 DIAGNOSIS — E1151 Type 2 diabetes mellitus with diabetic peripheral angiopathy without gangrene: Secondary | ICD-10-CM | POA: Diagnosis not present

## 2022-11-04 DIAGNOSIS — M5442 Lumbago with sciatica, left side: Secondary | ICD-10-CM | POA: Diagnosis not present

## 2022-11-08 ENCOUNTER — Ambulatory Visit: Payer: Medicare Other | Admitting: Cardiology

## 2022-11-08 ENCOUNTER — Encounter: Payer: Self-pay | Admitting: Cardiology

## 2022-11-08 VITALS — BP 129/65 | HR 78 | Ht 63.0 in | Wt 195.0 lb

## 2022-11-08 DIAGNOSIS — M5442 Lumbago with sciatica, left side: Secondary | ICD-10-CM | POA: Diagnosis not present

## 2022-11-08 DIAGNOSIS — I10 Essential (primary) hypertension: Secondary | ICD-10-CM

## 2022-11-08 DIAGNOSIS — M5441 Lumbago with sciatica, right side: Secondary | ICD-10-CM | POA: Diagnosis not present

## 2022-11-08 DIAGNOSIS — Z45018 Encounter for adjustment and management of other part of cardiac pacemaker: Secondary | ICD-10-CM | POA: Diagnosis not present

## 2022-11-08 DIAGNOSIS — E1151 Type 2 diabetes mellitus with diabetic peripheral angiopathy without gangrene: Secondary | ICD-10-CM | POA: Diagnosis not present

## 2022-11-08 DIAGNOSIS — I872 Venous insufficiency (chronic) (peripheral): Secondary | ICD-10-CM | POA: Diagnosis not present

## 2022-11-08 DIAGNOSIS — I442 Atrioventricular block, complete: Secondary | ICD-10-CM

## 2022-11-08 DIAGNOSIS — E1169 Type 2 diabetes mellitus with other specified complication: Secondary | ICD-10-CM | POA: Diagnosis not present

## 2022-11-08 DIAGNOSIS — E1140 Type 2 diabetes mellitus with diabetic neuropathy, unspecified: Secondary | ICD-10-CM | POA: Diagnosis not present

## 2022-11-08 DIAGNOSIS — Z950 Presence of cardiac pacemaker: Secondary | ICD-10-CM

## 2022-11-08 NOTE — Progress Notes (Signed)
Primary Physician/Referring:  Leamon Arnt, MD  Patient ID: Krista Clarke, female    DOB: 12-17-1940, 82 y.o.   MRN: [id number]  Chief Complaint  Patient presents with   Hypertension   heart block   HPI:    Krista Clarke  is a 82 y.o. AAF female  with  second-degree AV block and complete heart and S/P St. 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  She also has chronic back pain and sciatica and was recommended surgery but family did not want her to have surgery due to her underlying medical comorbidity and age.  She is accompanied by her daughter today.  She presents here for annual visit, accompanied by her daughter.  Patient states that she has been sleeping a lot lately.  Otherwise except for mild decrease in mobility, continued chronic back pain, she has no specific symptoms per se.  She has not had any dyspnea or leg edema, her activity level has markedly decreased.     Past Medical History:  Diagnosis Date   Anemia    takes iron daily   Arthritis    Complete heart block ([place]) 08/20/2015   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     Diabetes mellitus without complication ([place])    Encounter for care of pacemaker 05/24/2019   Hypercholesteremia    Hypertension    Pacemaker; 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 Marlou Sa 2010 lumbar MRI, multilevel and severe.    Social History   Tobacco Use   Smoking status: Never   Smokeless tobacco: Never  Substance Use Topics   Alcohol use: No  Marital Status: Widowed   ROS  Review of Systems  Cardiovascular:  Negative for chest pain, dyspnea on exertion and leg swelling.   Objective      11/08/2022    2:05 PM 10/22/2022   12:01 PM 10/22/2022   11:16 AM  Vitals with  BMI  Height '5\' 3"'$   '5\' 3"'$   Weight 195 lbs  199 lbs 3 oz  BMI 0000000  [id number]  Systolic Q000111Q Q000111Q [id number]  Diastolic 65 70 66  Pulse 78  67    Orthostatic VS for the past 72 hrs (Last 3 readings):  Patient Position BP Location Cuff Size  11/08/22 1405 Sitting Right Arm Small     Physical Exam Constitutional:      Appearance: She is obese.     Comments: Mildly obese  Neck:     Vascular: No carotid bruit or JVD.     Comments: Short neck and difficult to evaluate JVP Cardiovascular:     Rate and Rhythm: Normal rate and regular rhythm.     Pulses:          Dorsalis pedis pulses are 1+ on the right side and 1+ on the left side.       Posterior tibial pulses are 0 on the right side and 0 on the left side.     Heart sounds: Normal heart sounds. No murmur heard.    No gallop.  Pulmonary:     Effort: Pulmonary effort is normal.     Breath sounds: Normal breath sounds.  Abdominal:     General: Bowel sounds are normal.     Palpations: Abdomen  is soft.  Musculoskeletal:     Right lower leg: No edema.     Left lower leg: No edema.    Laboratory examination:   Lab Results  Component Value Date   NA 137 03/01/2022   K 4.7 03/01/2022   CO2 23 03/01/2022   GLUCOSE 128 (H) 03/01/2022   BUN 35 (H) 03/01/2022   CREATININE 1.71 (H) 03/01/2022   CALCIUM 9.9 03/01/2022   GFRNONAA 54 (L) 04/04/2020    Recent Labs    01/18/22 1125 02/03/22 1151 03/01/22 1451  NA 137 137 137  K 5.4 No hemolysis seen* 4.7 4.7  CL 100 97 100  CO2 '25 26 23  '$ GLUCOSE 115* 116* 128*  BUN 52* 44* 35*  CREATININE 1.57* 1.76* 1.71*  CALCIUM 10.0 9.9 9.9      Latest Ref Rng & Units 03/01/2022    2:51 PM 02/03/2022   11:51 AM 01/18/2022   11:25 AM  CMP  Glucose 65 - 99 mg/dL 128  116  115   BUN 7 - 25 mg/dL 35  44  52   Creatinine 0.60 - 0.95 mg/dL 1.71  1.76  1.57   Sodium 135 - 146 mmol/L 137  137  137   Potassium 3.5 - 5.3 mmol/L 4.7  4.7  5.4 No hemolysis seen   Chloride 98 - 110 mmol/L 100  97  100   CO2  20 - 32 mmol/L '23  26  25   '$ Calcium 8.6 - 10.4 mg/dL 9.9  9.9  10.0   Total Protein 6.1 - 8.1 g/dL 7.5   7.9   Total Bilirubin 0.2 - 1.2 mg/dL 0.4   0.3   Alkaline Phos 39 - 117 U/L   73   AST 10 - 35 U/L 13   11   ALT 6 - 29 U/L 14   8       Latest Ref Rng & Units 03/01/2022    2:51 PM 01/18/2022   11:25 AM 12/03/2020   10:36 AM  CBC  WBC 3.8 - 10.8 Thousand/uL 6.4  6.9  6.6   Hemoglobin 11.7 - 15.5 g/dL 9.9  9.8  9.6   Hematocrit 35.0 - 45.0 % 32.9  30.5  29.8   Platelets 140 - 400 Thousand/uL 331  278.0  250.0    Lipid Panel Recent Labs    01/18/22 1125  CHOL 120  TRIG 124.0  LDLCALC 45  VLDL 24.8  HDL 49.90  CHOLHDL 2   HEMOGLOBIN A1C Lab Results  Component Value Date   HGBA1C 7.8 (A) 10/22/2022   TSH Recent Labs    03/01/22 1451  TSH 2.95   External labs:  Labs 10/22/2022: A1c 7.8%.  Labs 03/01/2022:  Iron studies normal.  Hb 9.9, HCT 32.9, platelets 331.  Microcytic indicis.  Serum glucose 128 mg, BUN 35, creatinine 1.71 magnesium 2.4.  TSH normal at 2.95. Medications and allergies   Allergies  Allergen Reactions   Prednisone Other (See Comments), Palpitations and Rash    Doesn't work well for patient     Current Outpatient Medications:    allopurinol (ZYLOPRIM) 100 MG tablet, TAKE 1 TABLET BY MOUTH EVERY MORNING, Disp: 90 tablet, Rfl: 3   amLODipine (NORVASC) 5 MG tablet, Take 1 tablet (5 mg total) by mouth daily., Disp: 90 tablet, Rfl: 3   ASPIRIN LOW DOSE 81 MG chewable tablet, CHEW ONE TABLET BY MOUTH EVERY DAY, Disp: 90 tablet, Rfl: 3   atorvastatin (LIPITOR) 40 MG tablet, TAKE  1 TABLET BY MOUTH AT BEDTIME, Disp: 90 tablet, Rfl: 3   dapagliflozin propanediol (FARXIGA) 5 MG TABS tablet, Take 1 tablet (5 mg total) by mouth daily., Disp: 90 tablet, Rfl: 3   diclofenac Sodium (VOLTAREN) 1 % GEL, Apply 4 GRAMS topically 4 TIMES daily AS NEEDED FOR PAIN, Disp: 400 g, Rfl: 3   FEROSUL 325 (65 Fe) MG tablet, TAKE 1 TABLET BY MOUTH 2 TIMES DAILY, Disp:  90 tablet, Rfl: 3   furosemide (LASIX) 40 MG tablet, TAKE 1 TABLET BY MOUTH EVERY DAY, Disp: 90 tablet, Rfl: 3   gabapentin (NEURONTIN) 100 MG capsule, Take 1 capsule (100 mg total) by mouth 2 (two) times daily., Disp: 60 capsule, Rfl: 5   HYDROcodone-acetaminophen (NORCO/VICODIN) 5-325 MG tablet, Take 1 tablet by mouth in the morning and at bedtime., Disp: 60 tablet, Rfl: 0   losartan (COZAAR) 100 MG tablet, Take 1 tablet (100 mg total) by mouth daily., Disp: 90 tablet, Rfl: 3   losartan (COZAAR) 50 MG tablet, Take 50 mg by mouth daily., Disp: , Rfl:    Lutein 20 MG CAPS, TAKE 1 CAPSULE BY MOUTH EVERY DAY, Disp: 90 capsule, Rfl: 3   magnesium oxide (MAG-OX) 400 MG tablet, TAKE 1 TABLET BY MOUTH EVERY DAY, Disp: 28 tablet, Rfl: 3   meclizine (ANTIVERT) 12.5 MG tablet, Take 1 tablet (12.5 mg total) by mouth 2 (two) times daily as needed for dizziness., Disp: 30 tablet, Rfl: 2   metoprolol succinate (TOPROL-XL) 25 MG 24 hr tablet, Take 1 tablet (25 mg total) by mouth daily., Disp: 90 tablet, Rfl: 3   omeprazole (PRILOSEC) 20 MG capsule, TAKE 1 CAPSULE BY MOUTH EVERY DAY BEFORE breakfast, Disp: 90 capsule, Rfl: 3   triamcinolone (KENALOG) 0.1 %, Apply 1 application topically 2 (two) times daily. For 2 weeks, then as needed, Disp: 45 g, Rfl: 0   vitamin C (ASCORBIC ACID) 500 MG tablet, TAKE 1 TABLET BY MOUTH EVERY DAY, Disp: 90 tablet, Rfl: 3  Radiology:   DG Chest 2 View Result Date: 01/21/2020 1. Low lung volumes with streaky basilar atelectasis. No other acute cardiopulmonary abnormality.  2. Chronic elevation of the right hemidiaphragm.   Cardiac Studies:   Nuclear stress test 07/16/2013:  1. Resting EKG NSR, LBBB. Stress EKG was non diagnostic for ischemia. No ADDITIONAL ST-T changes of ischemia noted with pharmacologic stress testing. Stress symptoms included lightheadedness, headache and chest pressure. Stress terminated due to completion of protocol. 2. The perfusion study demonstrated a  very prominent breast attenuation artifact in the inferior, inferoseptal region, large size. Left ventricular ejection fraction was estimated to be 78%. This appearance is low risk study. Please see above for explanation.  Coronary angiogram 08/27/2014:  Normal coronary arteries. Severe tortuosity, suggests hypertensive heart disease. Normal LVEF.  Echocardiogram 12/11/2014:  1. Left ventricle cavity is normal in size. Mild concentric hypertrophy of the left ventricle. Normal global wall motion. Doppler evidence of grade II (pseudonormal) diastolic dysfunction. Calculated EF 63%. 2. Trace mitral regurgitation. Mild calcification of the mitral valve annulus. 3. Trace tricuspid regurgitation. IVC is normal with blunted respiratory response.  Lower Extremity Arterial Duplex 11/20/2016: Mildly decreased bilateral lower extremity ABIs, with the left ABI compatible with mild PAD and the right ABI compatible with borderline PAD, progressed in the interval. Further evaluation could be performed with CTA runoff as clinically indicated.  Device clinic: [facility] [id number] - dual chamber PPM - 08/20/2015.   Scheduled  In office pacemaker check 11/08/2022 Single (  S)/Dual (D)/BV: D. Presenting ASVP @ 75/min. Pacemaker dependant:  Yes. Underlying CHB. AP 23%, VP 100% AMS Episodes  o  HVR 0  Longevity 3.3 Years. Magnet rate: >85%. Lead measurements: Stable. Histogram: Low (L)/normal (N)/high (H)  Normal. Patient activity Good.   Observations: Normal pacemaker function. Changes: None.   Remote dual-chamber pacemaker transmission 08/10/2022: Longevity 3 years and 6 months. AP 32%, VP 99%. There were no high ventricular rate episodes, no mode switches. Lead impedance and thresholds are normal. Normal pacemaker function.     EKG:  EKG 11/08/2022: Atrially sensed, ventricularly paced rhythm at the rate of 77 bpm.  No further analysis.  Compared to 11/02/2021, no significant change.  Assessment      ICD-10-CM   1. Encounter for care of pacemaker  Z45.018     2. Complete heart block (HCC)  I44.2 EKG 12-Lead    3. 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, CHB  Z95.0     4. Essential hypertension  I10     No orders of the defined types were placed in this encounter.  There are no discontinued medications.   Recommendations:    Krista Clarke  is a 82 y.o. AAF female  with  second-degree AV block and complete heart and S/P St. 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  She also has chronic back pain and sciatica and was recommended surgery but family did not want her to have surgery due to her underlying medical comorbidity and age.  1. Encounter for care of pacemaker Patient's pacemaker was checked today, normal function.  She still has >3 years of battery life.  2. Complete heart block [facility]) Patient has complete heart block underneath, he is pacemaker dependent.  She will continue to transmit pacemaker on remotely as well for close monitoring.  3. 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, CHB As dictated above pacemaker is functioning normally.  On review of physiology, she has been physically very active as well which I congratulated the patient and gave her positive reinforcement.  4. Essential hypertension Blood pressure is under excellent control.  Patient has stage IIIa chronic kidney disease.  Recently due to elevated blood pressure in the office, losartan was increased from 100 mg to 150 mg daily and she is closely being monitored by Dr. Billey Chang regarding this.  From cardiac standpoint she remained stable without clinical evidence of heart failure, no changes in the medications were done today.  I reviewed her labs, she does have chronic microcytic anemia but iron  studies have been normal.  I will see her back on annual basis for pacemaker management.    Adrian Prows, MD, [facility] 11/08/2022, 6:41 PM Office: [phone] Pager: [phone]

## 2022-11-09 DIAGNOSIS — Z45018 Encounter for adjustment and management of other part of cardiac pacemaker: Secondary | ICD-10-CM | POA: Diagnosis not present

## 2022-11-09 DIAGNOSIS — I442 Atrioventricular block, complete: Secondary | ICD-10-CM | POA: Diagnosis not present

## 2022-11-11 DIAGNOSIS — E1140 Type 2 diabetes mellitus with diabetic neuropathy, unspecified: Secondary | ICD-10-CM | POA: Diagnosis not present

## 2022-11-11 DIAGNOSIS — M5441 Lumbago with sciatica, right side: Secondary | ICD-10-CM | POA: Diagnosis not present

## 2022-11-11 DIAGNOSIS — E1169 Type 2 diabetes mellitus with other specified complication: Secondary | ICD-10-CM | POA: Diagnosis not present

## 2022-11-11 DIAGNOSIS — E1151 Type 2 diabetes mellitus with diabetic peripheral angiopathy without gangrene: Secondary | ICD-10-CM | POA: Diagnosis not present

## 2022-11-11 DIAGNOSIS — M5442 Lumbago with sciatica, left side: Secondary | ICD-10-CM | POA: Diagnosis not present

## 2022-11-11 DIAGNOSIS — I872 Venous insufficiency (chronic) (peripheral): Secondary | ICD-10-CM | POA: Diagnosis not present

## 2022-11-12 ENCOUNTER — Other Ambulatory Visit: Payer: Self-pay | Admitting: Family Medicine

## 2022-11-12 DIAGNOSIS — K219 Gastro-esophageal reflux disease without esophagitis: Secondary | ICD-10-CM

## 2022-11-15 DIAGNOSIS — E1140 Type 2 diabetes mellitus with diabetic neuropathy, unspecified: Secondary | ICD-10-CM | POA: Diagnosis not present

## 2022-11-15 DIAGNOSIS — I872 Venous insufficiency (chronic) (peripheral): Secondary | ICD-10-CM | POA: Diagnosis not present

## 2022-11-15 DIAGNOSIS — E1151 Type 2 diabetes mellitus with diabetic peripheral angiopathy without gangrene: Secondary | ICD-10-CM | POA: Diagnosis not present

## 2022-11-15 DIAGNOSIS — E1169 Type 2 diabetes mellitus with other specified complication: Secondary | ICD-10-CM | POA: Diagnosis not present

## 2022-11-15 DIAGNOSIS — M5441 Lumbago with sciatica, right side: Secondary | ICD-10-CM | POA: Diagnosis not present

## 2022-11-15 DIAGNOSIS — M5442 Lumbago with sciatica, left side: Secondary | ICD-10-CM | POA: Diagnosis not present

## 2022-11-17 ENCOUNTER — Telehealth: Payer: Self-pay | Admitting: Family Medicine

## 2022-11-17 NOTE — Telephone Encounter (Signed)
Form has been placed in Andy's box.  08/03/2022 Pt has been notified that placard form has been placed up from at check in for pick up  

## 2022-11-17 NOTE — Telephone Encounter (Signed)
Home Health Certification or Plan of Care Tracking  Is this a Certification or Plan of Care? YES  HH Agency: [facility] Number:  [id number]  Has charge sheet been attached? YES   Where has form been placed:  PROVIDERS BOX  Faxed to:   [phone]

## 2022-11-22 DIAGNOSIS — M5441 Lumbago with sciatica, right side: Secondary | ICD-10-CM | POA: Diagnosis not present

## 2022-11-22 DIAGNOSIS — M5442 Lumbago with sciatica, left side: Secondary | ICD-10-CM | POA: Diagnosis not present

## 2022-11-22 DIAGNOSIS — E1151 Type 2 diabetes mellitus with diabetic peripheral angiopathy without gangrene: Secondary | ICD-10-CM | POA: Diagnosis not present

## 2022-11-22 DIAGNOSIS — I872 Venous insufficiency (chronic) (peripheral): Secondary | ICD-10-CM | POA: Diagnosis not present

## 2022-11-22 DIAGNOSIS — E1169 Type 2 diabetes mellitus with other specified complication: Secondary | ICD-10-CM | POA: Diagnosis not present

## 2022-11-22 DIAGNOSIS — E1140 Type 2 diabetes mellitus with diabetic neuropathy, unspecified: Secondary | ICD-10-CM | POA: Diagnosis not present

## 2022-11-29 ENCOUNTER — Telehealth: Payer: Self-pay | Admitting: Family Medicine

## 2022-11-29 NOTE — Telephone Encounter (Signed)
Home Health Verbal Orders  Agency:  [place]: Wendee Copp, [place]  [phone]  Requesting OT/ PT/ Skilled nursing/ Social Work/ Speech:  Physical therapy  Reason for Request:  To continue working on progressive gait balance training, home exercise progression, bilateral lower extremity strengthening, and safety fall precaution   Frequency:  1 x 3 weeks

## 2022-11-30 NOTE — Telephone Encounter (Signed)
LVM with Monique to move forward with her verbal orders.

## 2022-12-03 DIAGNOSIS — I872 Venous insufficiency (chronic) (peripheral): Secondary | ICD-10-CM | POA: Diagnosis not present

## 2022-12-03 DIAGNOSIS — M5442 Lumbago with sciatica, left side: Secondary | ICD-10-CM | POA: Diagnosis not present

## 2022-12-03 DIAGNOSIS — N189 Chronic kidney disease, unspecified: Secondary | ICD-10-CM | POA: Diagnosis not present

## 2022-12-03 DIAGNOSIS — E1151 Type 2 diabetes mellitus with diabetic peripheral angiopathy without gangrene: Secondary | ICD-10-CM | POA: Diagnosis not present

## 2022-12-03 DIAGNOSIS — E1122 Type 2 diabetes mellitus with diabetic chronic kidney disease: Secondary | ICD-10-CM | POA: Diagnosis not present

## 2022-12-03 DIAGNOSIS — M5441 Lumbago with sciatica, right side: Secondary | ICD-10-CM | POA: Diagnosis not present

## 2022-12-08 ENCOUNTER — Other Ambulatory Visit: Payer: Self-pay | Admitting: Family Medicine

## 2022-12-08 DIAGNOSIS — N189 Chronic kidney disease, unspecified: Secondary | ICD-10-CM | POA: Diagnosis not present

## 2022-12-08 DIAGNOSIS — E1151 Type 2 diabetes mellitus with diabetic peripheral angiopathy without gangrene: Secondary | ICD-10-CM | POA: Diagnosis not present

## 2022-12-08 DIAGNOSIS — I872 Venous insufficiency (chronic) (peripheral): Secondary | ICD-10-CM | POA: Diagnosis not present

## 2022-12-08 DIAGNOSIS — M5441 Lumbago with sciatica, right side: Secondary | ICD-10-CM | POA: Diagnosis not present

## 2022-12-08 DIAGNOSIS — E1122 Type 2 diabetes mellitus with diabetic chronic kidney disease: Secondary | ICD-10-CM | POA: Diagnosis not present

## 2022-12-08 DIAGNOSIS — M5442 Lumbago with sciatica, left side: Secondary | ICD-10-CM | POA: Diagnosis not present

## 2022-12-15 DIAGNOSIS — I872 Venous insufficiency (chronic) (peripheral): Secondary | ICD-10-CM | POA: Diagnosis not present

## 2022-12-15 DIAGNOSIS — E1151 Type 2 diabetes mellitus with diabetic peripheral angiopathy without gangrene: Secondary | ICD-10-CM | POA: Diagnosis not present

## 2022-12-15 DIAGNOSIS — N189 Chronic kidney disease, unspecified: Secondary | ICD-10-CM | POA: Diagnosis not present

## 2022-12-15 DIAGNOSIS — E1122 Type 2 diabetes mellitus with diabetic chronic kidney disease: Secondary | ICD-10-CM | POA: Diagnosis not present

## 2022-12-15 DIAGNOSIS — M5441 Lumbago with sciatica, right side: Secondary | ICD-10-CM | POA: Diagnosis not present

## 2022-12-15 DIAGNOSIS — M5442 Lumbago with sciatica, left side: Secondary | ICD-10-CM | POA: Diagnosis not present

## 2022-12-20 ENCOUNTER — Telehealth: Payer: Self-pay | Admitting: Pharmacist

## 2022-12-20 DIAGNOSIS — I872 Venous insufficiency (chronic) (peripheral): Secondary | ICD-10-CM | POA: Diagnosis not present

## 2022-12-20 DIAGNOSIS — N189 Chronic kidney disease, unspecified: Secondary | ICD-10-CM | POA: Diagnosis not present

## 2022-12-20 DIAGNOSIS — M5441 Lumbago with sciatica, right side: Secondary | ICD-10-CM | POA: Diagnosis not present

## 2022-12-20 DIAGNOSIS — M5442 Lumbago with sciatica, left side: Secondary | ICD-10-CM | POA: Diagnosis not present

## 2022-12-20 DIAGNOSIS — E1122 Type 2 diabetes mellitus with diabetic chronic kidney disease: Secondary | ICD-10-CM | POA: Diagnosis not present

## 2022-12-20 DIAGNOSIS — E1151 Type 2 diabetes mellitus with diabetic peripheral angiopathy without gangrene: Secondary | ICD-10-CM | POA: Diagnosis not present

## 2022-12-20 NOTE — Progress Notes (Signed)
Care Management & Coordination Services Pharmacy Team  Reason for Encounter: General adherence update   Contacted patient for general health update and medication adherence call.  {US HC Outreach:28874}   What concerns do you have about your medications?  The patient {denies/reports:25180} side effects with their medications.   How often do you forget or accidentally miss a dose? {missed doses:25554}  Do you use a pillbox? {yes/no:20286}  Are you having any problems getting your medications from your pharmacy? {yes/no:20286}  Has the cost of your medications been a concern? {yes/no:20286} If yes, what medication and is patient assistance available or has it been applied for?  Since last visit with PharmD, {no/thefollowing:25210} interventions have been made.   The patient {has/has not:25209} had an ED visit since last contact.   The patient {denies/reports:25180} problems with their health.   Patient {denies/reports:25180} concerns or questions for ***, PharmD at this time.   Counseled patient on: {GENERALCOUNSELING:28686}   Chart Updates:  Recent office visits:  ***  Recent consult visits:  ***  Hospital visits:  {Hospital DC Yes/No:21091515}  Medications: Outpatient Encounter Medications as of 12/20/2022  Medication Sig   allopurinol (ZYLOPRIM) 100 MG tablet TAKE 1 TABLET BY MOUTH EVERY MORNING   amLODipine (NORVASC) 5 MG tablet Take 1 tablet (5 mg total) by mouth daily.   ascorbic acid (VITAMIN C) 500 MG tablet TAKE 1 TABLET BY MOUTH EVERY DAY   ASPIRIN LOW DOSE 81 MG chewable tablet CHEW ONE TABLET BY MOUTH EVERY DAY   atorvastatin (LIPITOR) 40 MG tablet TAKE 1 TABLET BY MOUTH AT BEDTIME   dapagliflozin propanediol (FARXIGA) 5 MG TABS tablet Take 1 tablet (5 mg total) by mouth daily.   diclofenac Sodium (VOLTAREN) 1 % GEL Apply 4 GRAMS topically 4 TIMES daily AS NEEDED FOR PAIN   FEROSUL 325 (65 Fe) MG tablet TAKE 1 TABLET BY MOUTH 2 TIMES DAILY   furosemide  (LASIX) 40 MG tablet TAKE 1 TABLET BY MOUTH EVERY DAY   gabapentin (NEURONTIN) 100 MG capsule Take 1 capsule (100 mg total) by mouth 2 (two) times daily.   HYDROcodone-acetaminophen (NORCO/VICODIN) 5-325 MG tablet Take 1 tablet by mouth in the morning and at bedtime.   losartan (COZAAR) 100 MG tablet Take 1 tablet (100 mg total) by mouth daily.   losartan (COZAAR) 50 MG tablet Take 50 mg by mouth daily.   Lutein 20 MG CAPS TAKE 1 CAPSULE BY MOUTH EVERY DAY   magnesium oxide (MAG-OX) 400 MG tablet TAKE 1 TABLET BY MOUTH EVERY DAY   meclizine (ANTIVERT) 12.5 MG tablet Take 1 tablet (12.5 mg total) by mouth 2 (two) times daily as needed for dizziness.   metoprolol succinate (TOPROL-XL) 25 MG 24 hr tablet Take 1 tablet (25 mg total) by mouth daily.   omeprazole (PRILOSEC) 20 MG capsule TAKE 1 CAPSULE BY MOUTH EVERY DAY BEFORE breakfast   triamcinolone (KENALOG) 0.1 % Apply 1 application topically 2 (two) times daily. For 2 weeks, then as needed   No facility-administered encounter medications on file as of 12/20/2022.    Recent vitals BP Readings from Last 3 Encounters:  11/08/22 129/65  10/22/22 (!) 150/70  10/04/22 (!) 153/62   Pulse Readings from Last 3 Encounters:  11/08/22 78  10/22/22 67  10/04/22 84   Wt Readings from Last 3 Encounters:  11/08/22 195 lb (88.5 kg)  10/22/22 199 lb 3.2 oz (90.4 kg)  07/21/22 190 lb 3.2 oz (86.3 kg)   BMI Readings from Last 3 Encounters:  11/08/22 34.54 kg/m  10/22/22 35.29 kg/m  07/21/22 33.69 kg/m    Recent lab results    Component Value Date/Time   NA 137 03/01/2022 1451   K 4.7 03/01/2022 1451   CL 100 03/01/2022 1451   CO2 23 03/01/2022 1451   GLUCOSE 128 (H) 03/01/2022 1451   BUN 35 (H) 03/01/2022 1451   CREATININE 1.71 (H) 03/01/2022 1451   CALCIUM 9.9 03/01/2022 1451    Lab Results  Component Value Date   CREATININE 1.71 (H) 03/01/2022   GFR 26.64 (L) 02/03/2022   GFRNONAA 54 (L) 04/04/2020   GFRAA >60 04/04/2020    Lab Results  Component Value Date/Time   HGBA1C 7.8 (A) 10/22/2022 11:34 AM   HGBA1C 7.3 (A) 07/21/2022 02:36 PM   HGBA1C 6.7 (H) 01/18/2022 11:25 AM   HGBA1C 6.7 (H) 09/21/2021 04:25 PM   MICROALBUR 2.4 (H) 01/18/2022 10:04 AM    Lab Results  Component Value Date   CHOL 120 01/18/2022   HDL 49.90 01/18/2022   LDLCALC 45 01/18/2022   TRIG 124.0 01/18/2022   CHOLHDL 2 01/18/2022    Care Gaps: Annual wellness visit in last year? {yes/no:20286}  If Diabetic: Last eye exam / retinopathy screening: Last diabetic foot exam: Last UACR:   Star Rating Drugs: *** Medication:  Last Fill: Day Supply   Future Appointments  Date Time Provider Department Center  01/20/2023 11:30 AM Willow Ora, MD [facility]  02/02/2023  2:15 PM Helane Gunther, DPM [facility]  11/08/2023  2:00 PM Yates Decamp, MD [facility] None   April D Calhoun, [facility] Clinical Pharmacist Assistant [phone]

## 2022-12-21 ENCOUNTER — Other Ambulatory Visit: Payer: Self-pay | Admitting: Family Medicine

## 2022-12-23 ENCOUNTER — Telehealth: Payer: Self-pay | Admitting: Pharmacist

## 2022-12-23 NOTE — Progress Notes (Signed)
   PAP completed for Farxiga for patient.   [employer], [employer]

## 2022-12-27 ENCOUNTER — Telehealth: Payer: Self-pay | Admitting: Family Medicine

## 2022-12-27 DIAGNOSIS — M5442 Lumbago with sciatica, left side: Secondary | ICD-10-CM

## 2022-12-27 DIAGNOSIS — E1159 Type 2 diabetes mellitus with other circulatory complications: Secondary | ICD-10-CM

## 2022-12-27 DIAGNOSIS — Z7984 Long term (current) use of oral hypoglycemic drugs: Secondary | ICD-10-CM

## 2022-12-27 DIAGNOSIS — Z9181 History of falling: Secondary | ICD-10-CM

## 2022-12-27 DIAGNOSIS — E1140 Type 2 diabetes mellitus with diabetic neuropathy, unspecified: Secondary | ICD-10-CM

## 2022-12-27 DIAGNOSIS — E1151 Type 2 diabetes mellitus with diabetic peripheral angiopathy without gangrene: Secondary | ICD-10-CM

## 2022-12-27 DIAGNOSIS — E1122 Type 2 diabetes mellitus with diabetic chronic kidney disease: Secondary | ICD-10-CM

## 2022-12-27 DIAGNOSIS — N189 Chronic kidney disease, unspecified: Secondary | ICD-10-CM

## 2022-12-27 DIAGNOSIS — Z7982 Long term (current) use of aspirin: Secondary | ICD-10-CM

## 2022-12-27 DIAGNOSIS — E538 Deficiency of other specified B group vitamins: Secondary | ICD-10-CM

## 2022-12-27 DIAGNOSIS — D631 Anemia in chronic kidney disease: Secondary | ICD-10-CM

## 2022-12-27 DIAGNOSIS — M48061 Spinal stenosis, lumbar region without neurogenic claudication: Secondary | ICD-10-CM

## 2022-12-27 DIAGNOSIS — Z6832 Body mass index (BMI) 32.0-32.9, adult: Secondary | ICD-10-CM

## 2022-12-27 DIAGNOSIS — I152 Hypertension secondary to endocrine disorders: Secondary | ICD-10-CM

## 2022-12-27 DIAGNOSIS — M5441 Lumbago with sciatica, right side: Secondary | ICD-10-CM

## 2022-12-27 DIAGNOSIS — E1169 Type 2 diabetes mellitus with other specified complication: Secondary | ICD-10-CM

## 2022-12-27 DIAGNOSIS — I872 Venous insufficiency (chronic) (peripheral): Secondary | ICD-10-CM

## 2022-12-27 DIAGNOSIS — M109 Gout, unspecified: Secondary | ICD-10-CM

## 2022-12-27 NOTE — Telephone Encounter (Signed)
Forms has been placed in Krista Clarke's box 

## 2022-12-27 NOTE — Telephone Encounter (Signed)
[facility] faxed  document Home Health Certificate (Order [place] [id number]), to be filled out by provider. requested to send it via Fax [phone] . Document is located in providers tray at front office.

## 2022-12-28 DIAGNOSIS — E1122 Type 2 diabetes mellitus with diabetic chronic kidney disease: Secondary | ICD-10-CM | POA: Diagnosis not present

## 2022-12-28 DIAGNOSIS — E1151 Type 2 diabetes mellitus with diabetic peripheral angiopathy without gangrene: Secondary | ICD-10-CM | POA: Diagnosis not present

## 2022-12-28 DIAGNOSIS — E785 Hyperlipidemia, unspecified: Secondary | ICD-10-CM | POA: Diagnosis not present

## 2022-12-28 DIAGNOSIS — M5441 Lumbago with sciatica, right side: Secondary | ICD-10-CM | POA: Diagnosis not present

## 2022-12-28 DIAGNOSIS — E1169 Type 2 diabetes mellitus with other specified complication: Secondary | ICD-10-CM | POA: Diagnosis not present

## 2022-12-28 DIAGNOSIS — N189 Chronic kidney disease, unspecified: Secondary | ICD-10-CM | POA: Diagnosis not present

## 2022-12-28 DIAGNOSIS — M5442 Lumbago with sciatica, left side: Secondary | ICD-10-CM | POA: Diagnosis not present

## 2022-12-28 DIAGNOSIS — I10 Essential (primary) hypertension: Secondary | ICD-10-CM | POA: Diagnosis not present

## 2022-12-28 DIAGNOSIS — E1159 Type 2 diabetes mellitus with other circulatory complications: Secondary | ICD-10-CM | POA: Diagnosis not present

## 2022-12-28 DIAGNOSIS — I872 Venous insufficiency (chronic) (peripheral): Secondary | ICD-10-CM | POA: Diagnosis not present

## 2023-01-06 DIAGNOSIS — N189 Chronic kidney disease, unspecified: Secondary | ICD-10-CM | POA: Diagnosis not present

## 2023-01-06 DIAGNOSIS — E1122 Type 2 diabetes mellitus with diabetic chronic kidney disease: Secondary | ICD-10-CM | POA: Diagnosis not present

## 2023-01-06 DIAGNOSIS — M5441 Lumbago with sciatica, right side: Secondary | ICD-10-CM | POA: Diagnosis not present

## 2023-01-06 DIAGNOSIS — I872 Venous insufficiency (chronic) (peripheral): Secondary | ICD-10-CM | POA: Diagnosis not present

## 2023-01-06 DIAGNOSIS — M5442 Lumbago with sciatica, left side: Secondary | ICD-10-CM | POA: Diagnosis not present

## 2023-01-06 DIAGNOSIS — E1151 Type 2 diabetes mellitus with diabetic peripheral angiopathy without gangrene: Secondary | ICD-10-CM | POA: Diagnosis not present

## 2023-01-13 ENCOUNTER — Telehealth: Payer: Self-pay | Admitting: Family Medicine

## 2023-01-13 NOTE — Telephone Encounter (Signed)
Home Health Verbal Orders  Agency:  [facility]  Caller: Monique  Reason for Request:  Pt insurance is no longer covering treatment, they have to discharge patient.     [phone]

## 2023-01-14 ENCOUNTER — Other Ambulatory Visit: Payer: Self-pay | Admitting: Family Medicine

## 2023-01-14 ENCOUNTER — Telehealth: Payer: Self-pay | Admitting: Pharmacist

## 2023-01-14 DIAGNOSIS — E1159 Type 2 diabetes mellitus with other circulatory complications: Secondary | ICD-10-CM

## 2023-01-14 NOTE — Progress Notes (Signed)
Care Management & Coordination Services Pharmacy Team  Reason for Encounter: Appointment Reminder  Contacted patient to confirm telephone appointment with Erskine Emery, PharmD on 01/17/2023 at 3 pm. Spoke with patient on 01/14/2023    Star Rating Drugs:  Atorvastatin 40 mg last filled 12/29/2022 28 DS   Care Gaps: Annual wellness visit in last year? No  If Diabetic: Last eye exam / retinopathy screening: 10/15/2022 Last diabetic foot exam: 12/02/2021  Future Appointments  Date Time Provider Department Center  01/17/2023  3:00 PM Erroll Luna, [facility] None  01/20/2023 11:30 AM Willow Ora, MD [facility]  02/02/2023  2:15 PM Helane Gunther, DPM [facility]  11/08/2023  2:00 PM Yates Decamp, MD [facility] None   April D Calhoun, [facility] Clinical Pharmacist Assistant [phone]

## 2023-01-17 ENCOUNTER — Ambulatory Visit: Payer: Medicare Other | Admitting: Pharmacist

## 2023-01-17 ENCOUNTER — Other Ambulatory Visit: Payer: Self-pay

## 2023-01-17 DIAGNOSIS — I872 Venous insufficiency (chronic) (peripheral): Secondary | ICD-10-CM | POA: Diagnosis not present

## 2023-01-17 DIAGNOSIS — M5442 Lumbago with sciatica, left side: Secondary | ICD-10-CM | POA: Diagnosis not present

## 2023-01-17 DIAGNOSIS — E1122 Type 2 diabetes mellitus with diabetic chronic kidney disease: Secondary | ICD-10-CM | POA: Diagnosis not present

## 2023-01-17 DIAGNOSIS — M5441 Lumbago with sciatica, right side: Secondary | ICD-10-CM | POA: Diagnosis not present

## 2023-01-17 DIAGNOSIS — E1151 Type 2 diabetes mellitus with diabetic peripheral angiopathy without gangrene: Secondary | ICD-10-CM | POA: Diagnosis not present

## 2023-01-17 DIAGNOSIS — N189 Chronic kidney disease, unspecified: Secondary | ICD-10-CM | POA: Diagnosis not present

## 2023-01-17 MED ORDER — DAPAGLIFLOZIN PROPANEDIOL 5 MG PO TABS: 5.0000 mg | ORAL_TABLET | Freq: Every day | ORAL | 3 refills | Status: DC

## 2023-01-17 NOTE — Progress Notes (Unsigned)
Care Management & Coordination Services Pharmacy Note  01/18/2023 Name:  Krista Clarke MRN:  [id number] DOB:  [birth year]  Summary: PharmD FU visit.  Patient reports today that she had missed a few doses of Farxiga due to copay burden.  Organized new rx to [facility] and she should receive in 10-14 days.  Glucose has improved per home report since switch.  Discussed dietary mods and importance of adherence  Recommendations/Changes made from today's visit: PAP for Farxiga Recheck A1c any time - it is 3 months since last  Follow up plan: FU 3 months with me CMA to check on glucose in 2-4 weeks.   Subjective: Krista Clarke is an 82 y.o. year old female who is a primary patient of Willow Ora, MD.  The care coordination team was consulted for assistance with disease management and care coordination needs.    Engaged with patient by telephone for follow up visit.  Recent office visits:  10/22/2022 OV (PCP) Willow Ora, MD; Will add back farxiga 5 daily. discussed use of gabapentin. Start low dose; titrate up. Should help with sleep as well. increase losartan to 100mg  daily.    07/21/2022 OV (PCP) Willow Ora, MD;  Will see if cost of farxiga goes back down at the start of the new year. Contineu farxiga 5 daily. Recheck 3 months. Can increase to 10 if needed at that time.    Recent consult visits:  11/08/2022 OV (Cardiology) Yates Decamp, MD; no medication changes indicated.   10/04/2022 OV (Podiatry) Helane Gunther, DPM; no medication changes indicated.   06/29/2022 OV (Otolaryngology) Dittmer, Elder Cyphers, PA-C; no medication changes indicated.   Hospital visits:  None in previous 6 months   Objective:  Lab Results  Component Value Date   CREATININE 1.71 (H) 03/01/2022   BUN 35 (H) 03/01/2022   GFR 26.64 (L) 02/03/2022   GFRNONAA 54 (L) 04/04/2020   GFRAA >60 04/04/2020   NA 137 03/01/2022   K 4.7 03/01/2022   CALCIUM 9.9 03/01/2022   CO2 23 03/01/2022   GLUCOSE 128 (H)  03/01/2022    Lab Results  Component Value Date/Time   HGBA1C 7.8 (A) 10/22/2022 11:34 AM   HGBA1C 7.3 (A) 07/21/2022 02:36 PM   HGBA1C 6.7 (H) 01/18/2022 11:25 AM   HGBA1C 6.7 (H) 09/21/2021 04:25 PM   GFR 26.64 (L) 02/03/2022 11:51 AM   GFR 30.56 (L) 01/18/2022 11:25 AM   MICROALBUR 2.4 (H) 01/18/2022 10:04 AM    Last diabetic Eye exam:  Lab Results  Component Value Date/Time   HMDIABEYEEXA No Retinopathy 10/15/2022 02:12 PM    Last diabetic Foot exam: No results found for: "HMDIABFOOTEX"   Lab Results  Component Value Date   CHOL 120 01/18/2022   HDL 49.90 01/18/2022   LDLCALC 45 01/18/2022   TRIG 124.0 01/18/2022   CHOLHDL 2 01/18/2022       Latest Ref Rng & Units 03/01/2022    2:51 PM 01/18/2022   11:25 AM 09/21/2021    4:25 PM  Hepatic Function  Total Protein 6.1 - 8.1 g/dL 7.5  7.9  8.4   Albumin 3.5 - 5.2 g/dL  4.4  4.7   AST 10 - 35 U/L 13  11  12    ALT 6 - 29 U/L 14  8  8    Alk Phosphatase 39 - 117 U/L  73  62   Total Bilirubin 0.2 - 1.2 mg/dL 0.4  0.3  0.4     Lab Results  Component Value Date/Time   TSH 2.95 03/01/2022 02:51 PM   TSH 4.28 03/09/2021 03:33 PM   FREET4 0.92 03/09/2021 03:33 PM       Latest Ref Rng & Units 03/01/2022    2:51 PM 01/18/2022   11:25 AM 12/03/2020   10:36 AM  CBC  WBC 3.8 - 10.8 Thousand/uL 6.4  6.9  6.6   Hemoglobin 11.7 - 15.5 g/dL 9.9  9.8  9.6   Hematocrit 35.0 - 45.0 % 32.9  30.5  29.8   Platelets 140 - 400 Thousand/uL 331  278.0  250.0     Lab Results  Component Value Date/Time   VITAMINB12 465 01/18/2022 11:25 AM   VITAMINB12 1,104 (H) 12/03/2020 10:36 AM    Clinical ASCVD: No  The ASCVD Risk score (Arnett DK, et al., 2019) failed to calculate for the following reasons:   The 2019 ASCVD risk score is only valid for ages 64 to 7        10/22/2022   11:23 AM 07/21/2022    2:25 PM 03/06/2021   11:53 AM  Depression screen PHQ 2/9  Decreased Interest 0 0 0  Down, Depressed, Hopeless 0 0 0  PHQ - 2 Score 0 0  0     Social History   Tobacco Use  Smoking Status Never  Smokeless Tobacco Never   BP Readings from Last 3 Encounters:  11/08/22 129/65  10/22/22 (!) 150/70  10/04/22 (!) 153/62   Pulse Readings from Last 3 Encounters:  11/08/22 78  10/22/22 67  10/04/22 84   Wt Readings from Last 3 Encounters:  11/08/22 195 lb (88.5 kg)  10/22/22 199 lb 3.2 oz (90.4 kg)  07/21/22 190 lb 3.2 oz (86.3 kg)   BMI Readings from Last 3 Encounters:  11/08/22 34.54 kg/m  10/22/22 35.29 kg/m  07/21/22 33.69 kg/m    Allergies  Allergen Reactions   Prednisone Other (See Comments), Palpitations and Rash    Doesn't work well for patient    Medications Reviewed Today     Reviewed by Erroll Luna, [facility] (Pharmacist) on 01/18/23 at 0906  Med List Status: <None>   Medication Order Taking? Sig Documenting Provider Last Dose Status Informant  allopurinol (ZYLOPRIM) 100 MG tablet [id number] Yes TAKE 1 TABLET BY MOUTH EVERY MORNING Willow Ora, MD Taking Active   amLODipine (NORVASC) 5 MG tablet [id number] Yes TAKE 1 TABLET BY MOUTH EVERY DAY Willow Ora, MD Taking Active   ascorbic acid (VITAMIN C) 500 MG tablet [id number] Yes TAKE 1 TABLET BY MOUTH EVERY DAY Willow Ora, MD Taking Active   ASPIRIN LOW DOSE 81 MG chewable tablet [id number] Yes CHEW ONE TABLET BY MOUTH EVERY DAY Willow Ora, MD Taking Active   atorvastatin (LIPITOR) 40 MG tablet [id number] Yes TAKE 1 TABLET BY MOUTH AT BEDTIME Willow Ora, MD Taking Active   dapagliflozin propanediol (FARXIGA) 5 MG TABS tablet [id number] Yes Take 1 tablet (5 mg total) by mouth daily. Willow Ora, MD Taking Active   diclofenac Sodium (VOLTAREN) 1 % GEL [id number] Yes Apply 4 GRAMS topically 4 TIMES daily AS NEEDED FOR PAIN Willow Ora, MD Taking Active   FEROSUL 325 (65 Fe) MG tablet [id number] Yes TAKE 1 TABLET BY MOUTH 2 TIMES DAILY Willow Ora, MD Taking Active   furosemide (LASIX) 40 MG tablet [id number] Yes TAKE 1  TABLET BY MOUTH EVERY DAY Willow Ora, MD Taking Active   gabapentin (NEURONTIN) 100 MG capsule [id number]  Yes Take 1 capsule (100 mg total) by mouth 2 (two) times daily. Willow Ora, MD Taking Active   HYDROcodone-acetaminophen (NORCO/VICODIN) 5-325 MG tablet [id number] Yes TAKE 1 TABLET BY MOUTH 2 TIMES DAILY IN THE MORNING AND AT BEDTIME Willow Ora, MD Taking Active   losartan (COZAAR) 100 MG tablet [id number] Yes Take 1 tablet (100 mg total) by mouth daily. Willow Ora, MD Taking Active   losartan (COZAAR) 50 MG tablet [id number] Yes Take 50 mg by mouth daily. [provider] Taking Active   Lutein 20 MG CAPS [id number] Yes TAKE 1 CAPSULE BY MOUTH EVERY DAY Willow Ora, MD Taking Active   magnesium oxide (MAG-OX) 400 (240 Mg) MG tablet [id number] Yes TAKE 1 TABLET BY MOUTH EVERY DAY Willow Ora, MD Taking Active   meclizine (ANTIVERT) 12.5 MG tablet [id number] Yes Take 1 tablet (12.5 mg total) by mouth 2 (two) times daily as needed for dizziness. Willow Ora, MD Taking Active   metoprolol succinate (TOPROL-XL) 25 MG 24 hr tablet [id number] Yes TAKE 1 TABLET BY MOUTH EVERY DAY Willow Ora, MD Taking Active   omeprazole (PRILOSEC) 20 MG capsule [id number] Yes TAKE 1 CAPSULE BY MOUTH EVERY DAY BEFORE breakfast Willow Ora, MD Taking Active   triamcinolone (KENALOG) 0.1 % [id number] Yes Apply 1 application topically 2 (two) times daily. For 2 weeks, then as needed Willow Ora, MD Taking Active             SDOH:  (Social Determinants of Health) assessments and interventions performed: Yes Financial Resource Strain: Low Risk  (01/18/2023)   Overall Financial Resource Strain (CARDIA)    Difficulty of Paying Living Expenses: Not hard at all   Food Insecurity: No Food Insecurity (01/18/2023)   Hunger Vital Sign    Worried About Running Out of Food in the Last Year: Never true    Ran Out of Food in the Last Year: Never true    SDOH Interventions     Flowsheet Row Office Visit from 03/28/2019 in [facility] from 05/12/2018 in [facility]  SDOH Interventions    Depression Interventions/Treatment  Counseling Currently on Treatment       Medication Assistance: Application for [employer]  medication assistance program. in process.  Anticipated assistance start date 10-14 business days.  See plan of care for additional detail.  Medication Access: Name and location of current pharmacy:  [facility] - [place], [place] - [address] [place] [address] Phone: [phone] Fax: [phone]  [facility] - [place], [place] - [address] [place] [address] Phone: [phone] Fax: [phone]  Within the past 30 days, how often has patient missed a dose of medication? 7-10 Is a pillbox or other method used to improve adherence? Yes  Factors that may affect medication adherence? financial need Are meds synced by current pharmacy? No  Are meds delivered by current pharmacy? Yes  Does patient experience delays in picking up medications due to transportation concerns? No   Compliance/Adherence/Medication fill history: [facility] Drugs:  Atorvastatin 40 mg last filled 12/29/2022 28 DS     Care Gaps: Annual wellness visit in last year? No   If Diabetic: Last eye exam / retinopathy screening: 10/15/2022 Last diabetic foot exam: 12/02/2021   Assessment/Plan     Hypertension (BP goal <140/90) -Controlled -Current treatment: Losartan 50mg  daily Amlodipine 10mg  daily Metoprolol tartrate  25mg  bid -Medications previously tried: HCTZ, spironolactone  -Current home readings: 124/74 was most recent she could remember -Current dietary habits: not much of an appetite -Current exercise habits: minimal now, plans to start PT soon to be able to get up and down steps -Denies hypotensive/hypertensive symptoms -Educated on Daily  salt intake goal < 2300 mg; Exercise goal of 150 minutes per week; Importance of home blood pressure monitoring; Symptoms of hypotension and importance of maintaining adequate hydration; -Counseled to monitor BP at home weekly, document, and provide log at future appointments -Recommended to continue current medication -Patient mentions some swelling in feet/ankles/kneed - not painful or red and does resolve over night or when she elevates feet.  Hyperlipidemia/PAD: (LDL goal < 70) -Controlled -Current treatment: ASA 81mg  daily Clopidogrel 75mg  daily Atorvastatin 40mg  daily -Medications previously tried: none noted  -Current dietary patterns: see above -Current exercise habits: see above -Educated on Cholesterol goals;  Benefits of statin for ASCVD risk reduction; Importance of limiting foods high in cholesterol; Exercise goal of 150 minutes per week; -Most recent lipid panel excellent -Recommended to continue current medication  Diabetes (A1c goal <7%) 01/17/23 -Uncontrolled, most recent A1c is 7.8% -Current medications: Farxiga 5mg  daily Appropriate, Effective, Safe, Query accessible -Medications previously tried: Januvia  -Current home glucose readings fasting glucose: 100-110 fasting post prandial glucose: N/A -Denies hypoglycemic/hyperglycemic symptoms -Educated on A1c and blood sugar goals; Exercise goal of 150 minutes per week; Prevention and management of hypoglycemic episodes; Counseled to check feet daily and get yearly eye exams -Reports improved fasting glucose since switching to [place].  Upon review she has been approved for patient assistance but missing rx to program.  She reports a few missed doses due to high copays.  Discussed program with her and new rx sent to [facility] for delivery in 10-14 days.  She is to call in 2 weeks if she does not have rx.  Continue adherence with med.  Recheck A1c in 3 months from previous.   CMA to call to check on PAP and glucose  in 2 weeks.  Gout (Goal: Control symptoms) -Controlled -Current treatment  Allopurinol 100mg  -Medications previously tried: none noted -No issues with gout as of late - continues adherence with meds  -Recommended to continue current medication        Willa Frater, PharmD Clinical Pharmacist  [facility] [phone]

## 2023-01-20 ENCOUNTER — Encounter: Payer: Medicare Other | Admitting: Family Medicine

## 2023-01-24 ENCOUNTER — Encounter: Payer: Medicare Other | Admitting: Pharmacist

## 2023-01-28 ENCOUNTER — Other Ambulatory Visit (HOSPITAL_COMMUNITY): Payer: Self-pay

## 2023-02-02 ENCOUNTER — Ambulatory Visit: Payer: Medicare Other | Admitting: Podiatry

## 2023-02-07 ENCOUNTER — Ambulatory Visit (INDEPENDENT_AMBULATORY_CARE_PROVIDER_SITE_OTHER): Payer: Medicare Other | Admitting: Family Medicine

## 2023-02-07 VITALS — BP 152/64 | HR 78 | Temp 98.7°F | Ht 63.0 in | Wt 199.2 lb

## 2023-02-07 DIAGNOSIS — D638 Anemia in other chronic diseases classified elsewhere: Secondary | ICD-10-CM | POA: Diagnosis not present

## 2023-02-07 DIAGNOSIS — E1121 Type 2 diabetes mellitus with diabetic nephropathy: Secondary | ICD-10-CM

## 2023-02-07 DIAGNOSIS — L4050 Arthropathic psoriasis, unspecified: Secondary | ICD-10-CM

## 2023-02-07 DIAGNOSIS — E1169 Type 2 diabetes mellitus with other specified complication: Secondary | ICD-10-CM | POA: Diagnosis not present

## 2023-02-07 DIAGNOSIS — F1120 Opioid dependence, uncomplicated: Secondary | ICD-10-CM | POA: Diagnosis not present

## 2023-02-07 DIAGNOSIS — E538 Deficiency of other specified B group vitamins: Secondary | ICD-10-CM

## 2023-02-07 DIAGNOSIS — M109 Gout, unspecified: Secondary | ICD-10-CM

## 2023-02-07 DIAGNOSIS — N1832 Chronic kidney disease, stage 3b: Secondary | ICD-10-CM | POA: Insufficient documentation

## 2023-02-07 DIAGNOSIS — E1140 Type 2 diabetes mellitus with diabetic neuropathy, unspecified: Secondary | ICD-10-CM

## 2023-02-07 DIAGNOSIS — F02A0 Dementia in other diseases classified elsewhere, mild, without behavioral disturbance, psychotic disturbance, mood disturbance, and anxiety: Secondary | ICD-10-CM

## 2023-02-07 DIAGNOSIS — I442 Atrioventricular block, complete: Secondary | ICD-10-CM

## 2023-02-07 DIAGNOSIS — I152 Hypertension secondary to endocrine disorders: Secondary | ICD-10-CM

## 2023-02-07 DIAGNOSIS — E1159 Type 2 diabetes mellitus with other circulatory complications: Secondary | ICD-10-CM | POA: Diagnosis not present

## 2023-02-07 DIAGNOSIS — E785 Hyperlipidemia, unspecified: Secondary | ICD-10-CM | POA: Diagnosis not present

## 2023-02-07 DIAGNOSIS — M48062 Spinal stenosis, lumbar region with neurogenic claudication: Secondary | ICD-10-CM

## 2023-02-07 DIAGNOSIS — Z0000 Encounter for general adult medical examination without abnormal findings: Secondary | ICD-10-CM | POA: Diagnosis not present

## 2023-02-07 DIAGNOSIS — Z6835 Body mass index (BMI) 35.0-35.9, adult: Secondary | ICD-10-CM

## 2023-02-07 DIAGNOSIS — I739 Peripheral vascular disease, unspecified: Secondary | ICD-10-CM

## 2023-02-07 LAB — POCT GLYCOSYLATED HEMOGLOBIN (HGB A1C): Hemoglobin A1C: 7.8 % — AB (ref 4.0–5.6)

## 2023-02-07 MED ORDER — GABAPENTIN 100 MG PO CAPS: 100.0000 mg | ORAL_CAPSULE | Freq: Every day | ORAL | 5 refills | Status: DC

## 2023-02-07 MED ORDER — HYDROCODONE-ACETAMINOPHEN 5-325 MG PO TABS: ORAL_TABLET | ORAL | 0 refills | Status: DC

## 2023-02-07 MED ORDER — DAPAGLIFLOZIN PROPANEDIOL 10 MG PO TABS: 10.0000 mg | ORAL_TABLET | Freq: Every day | ORAL | 3 refills | Status: DC

## 2023-02-07 MED ORDER — VITAMIN B-12 1000 MCG PO TABS: 1000.0000 ug | ORAL_TABLET | Freq: Every day | ORAL | 3 refills | Status: DC

## 2023-02-07 NOTE — Progress Notes (Signed)
Subjective  Chief Complaint  Patient presents with   Annual Exam   Diabetes   Hypertension    HPI: Krista Clarke is a 82 y.o. female who presents to [facility] [place] today for a Female Wellness Visit. She also has the concerns and/or needs as listed above in the chief complaint. These will be addressed in addition to the Health Maintenance Visit.   Wellness Visit: annual visit with health maintenance review and exam without Pap  HM: living independently. Uses walker. Imms current. Reports doing well overall. Daughter requesting FMLA intermittent to help care for mom when she needs it. Chronic disease f/u and/or acute problem visit: (deemed necessary to be done in addition to the wellness visit): DM: denies sxs of hyperglycemia. On farxiga 5 daily. C/o nighttime paresthesias bilateral feet. Had ordered gabapentin but pt says she never received it. No sores.  Heart block, pacer dependent. Reviewed recent cards notes. Bp was normal there.  HTN: elevated in office today. Had been well controlled. Taking bb and arb and amlodipine. Chronic back pain due norco refill. Feels control is good. Dementia: occ forgetfulness. Daughter reports stability. Failed aricept due to side effects.  Mild PAD: no sxs due to limited activity CKD and anemia. Due labs.  Gout w/o recent flares on allopurinol.  HLD on statin. Non fasting for recheck.   Assessment  1. Annual physical exam   2. Type 2 diabetes, controlled, with neuropathy (HCC)   3. Hyperlipidemia associated with type 2 diabetes mellitus (HCC)   4. Hypertension associated with diabetes (HCC)   5. Spinal stenosis of lumbar region with neurogenic claudication   6. Chronic narcotic dependence (HCC)   7. PAD (peripheral artery disease) (HCC)   8. Chronic disease anemia   9. Gout, unspecified cause, unspecified chronicity, unspecified site   10. Vitamin B12 deficiency   11. Diabetic nephropathy associated with type 2 diabetes  mellitus (HCC)   12. Complete heart block (HCC)   13. Stage 3b chronic kidney disease (HCC)   14. Severe obesity (BMI 35.0-35.9 with comorbidity) (HCC) Chronic  15. Mild late onset Alzheimer's dementia without behavioral disturbance, psychotic disturbance, mood disturbance, or anxiety (HCC) Chronic  16. Psoriasis with arthropathy [facility])      Plan  Female Wellness Visit: Age appropriate Health Maintenance and Prevention measures were discussed with patient. Included topics are cancer screening recommendations, ways to keep healthy (see AVS) including dietary and exercise recommendations, regular eye and dental care, use of seat belts, and avoidance of moderate alcohol use and tobacco use.  BMI: discussed patient's BMI and encouraged positive lifestyle modifications to help get to or maintain a target BMI. HM needs and immunizations were addressed and ordered. See below for orders. See HM and immunization section for updates. Routine labs and screening tests ordered including cmp, cbc and lipids where appropriate. Discussed recommendations regarding Vit D and calcium supplementation (see AVS)  Chronic disease management visit and/or acute problem visit: Mutliple chronic diseases addressed today as listed above. Diabetic control is fair given age and comorbidities: will increase farxiga to 10 daily and monitor. Limit sweets. Start gabapentin 100-300 nightly.  Continue norco bid for pain control. Pdmp reviewed today Monitor renal disease. Fluid status is stable on lasix daily.  BP: to start home monitoring since it had been well controlled. No bp med changes today, continue metoprolol, amlodipine and losartan at doses listed below.  Monitor cog function. Appears good today Pacer working well Check uric acid on allopurinol  100 daily Rechekc b12 levels. Ordered oral supplements.  Take mag ox dialy for cramps. Check levels.   Follow up: 6 mo for recheck  Orders Placed This Encounter  Procedures    CBC with Differential/Platelet   Comprehensive metabolic panel   Lipid panel   TSH   Uric acid   Vitamin B12   Magnesium   POCT HgB A1C   Meds ordered this encounter  Medications   HYDROcodone-acetaminophen (NORCO/VICODIN) 5-325 MG tablet    Sig: TAKE 1 TABLET BY MOUTH 2 TIMES DAILY IN THE MORNING AND AT BEDTIME    Dispense:  60 tablet    Refill:  0   dapagliflozin propanediol (FARXIGA) 10 MG TABS tablet    Sig: Take 1 tablet (10 mg total) by mouth daily.    Dispense:  90 tablet    Refill:  3   gabapentin (NEURONTIN) 100 MG capsule    Sig: Take 1-3 capsules (100-300 mg total) by mouth at bedtime.    Dispense:  90 capsule    Refill:  5   cyanocobalamin (VITAMIN B12) 1000 MCG tablet    Sig: Take 1 tablet (1,000 mcg total) by mouth daily.    Dispense:  90 tablet    Refill:  3      Body mass index is 35.29 kg/m. Wt Readings from Last 3 Encounters:  02/07/23 199 lb 3.2 oz (90.4 kg)  11/08/22 195 lb (88.5 kg)  10/22/22 199 lb 3.2 oz (90.4 kg)     Patient Active Problem List   Diagnosis Date Noted   Stage 3b chronic kidney disease (HCC) 02/07/2023    Priority: High   Diabetic nephropathy associated with type 2 diabetes mellitus (HCC) 07/21/2022    Priority: High   Complete heart block (HCC) 09/21/2021    Priority: High    Pacemaker dependent, Dr. Jacinto Halim    Lumbar stenosis 10/31/2019    Priority: High    Dr. August Saucer 2010 lumbar MRI, multilevel and severe. Failed cymbalta, PT, lyrica and gabapentin. [facility]ated at [facility] with epidural spinal injections    Chronic narcotic dependence (HCC) 07/13/2019    Priority: High   Mild late onset Alzheimer's dementia without behavioral disturbance, psychotic disturbance, mood disturbance, or anxiety (HCC) 09/18/2018    Priority: High   PAD (peripheral artery disease) (HCC) 11/18/2016    Priority: High    MILD: LE [place] on 04/29/16, mild arterial occlusive disease. Mild right iliac arterial disease and mild left distal popliteal  disease. Right ABI 1.06, Left ABI 1.07.    Chronic midline low back pain with bilateral sciatica 10/02/2016    Priority: High   Severe obesity (BMI 35.0-35.9 with comorbidity) (HCC) 10/02/2016    Priority: High   Hyperlipidemia associated with type 2 diabetes mellitus (HCC) 10/02/2016    Priority: High   Hypertension associated with diabetes (HCC) 03/08/2016    Priority: High   Type 2 diabetes, controlled, with neuropathy (HCC) 03/08/2016    Priority: High   Pacemaker; 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, CHB 08/20/2015    Priority: High    Remote dual-chamber pacemaker transmission 11/09/2022: Longevity 3 years and 6 months.  AP 41%, VP 99%.  There were no mode switches, no high ventricular rate episodes.  Normal pacemaker function.     Scheduled  In office pacemaker check 11/08/2022 Single (S)/Dual (D)/BV: D. Presenting ASVP @ 75/min. Pacemaker dependant:  Yes. Underlying CHB. AP 23%, VP 100% AMS Episodes  o  HVR 0  Longevity 3.3 Years. Magnet rate: >85%. Lead measurements: Stable. Histogram: Low (L)/normal (N)/high (H)  Normal. Patient activity Good.   Observations: Normal pacemaker function. Changes: None.     Gout 12/03/2020    Priority: Medium    Chronic venous insufficiency 10/17/2020    Priority: Medium    Urinary incontinence in female, followed by GYN, did not tolerate pessary 06/27/2018    Priority: Medium    LBBB (left bundle branch block) 11/18/2016    Priority: Medium     EKG 07/08/16: Sinus rhythm , rate 80, left axis deviation, left anterior fascicular block, left bundle branch block.    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HCC) 10/03/2016    Priority: Medium    Paresthesia of both lower extremities 10/02/2016    Priority: Medium     Due to lumbar stenosis.neurontin    Chronic disease anemia 03/08/2016    Priority: Medium    Vitamin B12 deficiency 02/06/2020    Priority: Low   Health Maintenance  Topic Date Due   Medicare  Annual Wellness (AWV)  03/06/2022   Diabetic kidney evaluation - eGFR measurement  03/02/2023   COVID-19 Vaccine (6 - 2023-24 season) 02/23/2023 (Originally 10/02/2022)   Diabetic kidney evaluation - Urine ACR  02/07/2028 (Originally 01/19/2023)   INFLUENZA VACCINE  03/31/2023   HEMOGLOBIN A1C  08/09/2023   OPHTHALMOLOGY EXAM  10/16/2023   FOOT EXAM  02/07/2024   DTaP/Tdap/Td (2 - Td or Tdap) 06/15/2028   Pneumonia Vaccine 58+ Years old  Completed   DEXA SCAN  Completed   Zoster Vaccines- Shingrix  Completed   HPV VACCINES  Aged Out   Immunization History  Administered Date(s) Administered   Fluad Quad(high Dose 65+) 06/19/2019, 05/09/2020, 06/09/2021, 07/21/2022   Influenza, High Dose Seasonal PF 06/30/2017, 05/12/2018   PFIZER(Purple Top)SARS-COV-2 Vaccination 10/06/2019, 10/29/2019, 06/13/2020   Pfizer Covid-19 Vaccine Bivalent Booster 28yrs & up 06/30/2021, 08/07/2022   Pneumococcal Conjugate-13 05/12/2018   Pneumococcal Polysaccharide-23 07/13/2019   RSV,unspecified 08/07/2022   Tdap 06/15/2018   Zoster Recombinat (Shingrix) 07/30/2019, 01/17/2020   We updated and reviewed the patient's past history in detail and it is documented below. Allergies: Patient is allergic to prednisone. Past Medical History Patient  has a past medical history of Anemia, Arthritis, Complete heart block (HCC) (08/20/2015), Diabetes mellitus without complication (HCC), Encounter for care of pacemaker (05/24/2019), Hypercholesteremia, Hypertension, Pacemaker; 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 Past Surgical History Patient  has a past surgical history that includes Tubal ligation; Abdominal hysterectomy; Colonoscopy with propofol (N/A, 07/03/2013); left heart catheterization with coronary angiogram (N/A, 08/27/2014); Cardiac catheterization (N/A, 08/20/2015); and Joint replacement  (Bilateral). Family History: Patient family history includes Breast cancer in her granddaughter; Cancer in her brother; Diabetes in her mother; Hypertension in her father and mother. Social History:  Patient  reports that she has never smoked. She has never used smokeless tobacco. She reports that she does not drink alcohol and does not use drugs.  Review of Systems: Constitutional: negative for fever or malaise Ophthalmic: negative for photophobia, double vision or loss of vision Cardiovascular: negative for chest pain, dyspnea on exertion, or new LE swelling Respiratory: negative for SOB or persistent cough Gastrointestinal: negative for abdominal pain, change in bowel habits or melena Genitourinary: negative for dysuria or gross hematuria, no abnormal uterine bleeding or disharge Musculoskeletal: negative for new gait disturbance or muscular weakness Integumentary: negative  for new or persistent rashes, no breast lumps Neurological: negative for TIA or stroke symptoms Psychiatric: negative for SI or delusions Allergic/Immunologic: negative for hives  Patient Care Team    Relationship Specialty Notifications Start End  Willow Ora, MD PCP - General Family Medicine  07/12/19   Yates Decamp, MD Consulting Physician Cardiology  11/18/16   Specialists, [facility] Consulting Physician Orthopedic Surgery  04/28/17   Manning Charity, OD Referring Physician Optometry  04/28/17   Alfredo Martinez, MD Consulting Physician Urology  07/13/19   Helane Gunther, [facility] Consulting Physician Podiatry  12/20/19   Erroll Luna, [facility] Pharmacist Pharmacist  04/06/21    Comment: [phone]  Darnell Level, MD Consulting Physician Nephrology  07/21/22    Comment: [facility]    Objective  Vitals: BP (!) 152/64   Pulse 78   Temp 98.7 F (37.1 C)   Ht 5\' 3"  (1.6 m)   Wt 199 lb 3.2 oz (90.4 kg)   LMP  (LMP Unknown)   SpO2 97%   BMI 35.29 kg/m  General:  Well  developed, well nourished, no acute distress , appears well today Psych:  Alert and orientedx3,normal mood and affect HEENT:  Normocephalic, atraumatic, non-icteric sclera,  supple neck without adenopathy, mass or thyromegaly Cardiovascular:  Normal S1, S2, RRR without gallop, rub or murmur Respiratory:  Good breath sounds bilaterally, CTAB with normal respiratory effort Gastrointestinal: normal bowel sounds, soft, non-tender, no noted masses. No HSM MSK: extremities without edema, joints without erythema or swelling Neurologic:    Mental status is normal.  Gross motor and sensory exams are normal.  No tremor Diabetic Foot Exam: Appearance - no lesions, ulcers or significant calluses Skin - no sigificant pallor or erythema Normal sensation Pulses - +2 distally bilaterally   Commons side effects, risks, benefits, and alternatives for medications and treatment plan prescribed today were discussed, and the patient expressed understanding of the given instructions. Patient is instructed to call or message via MyChart if he/she has any questions or concerns regarding our treatment plan. No barriers to understanding were identified. We discussed Red Flag symptoms and signs in detail. Patient expressed understanding regarding what to do in case of urgent or emergency type symptoms.  Medication list was reconciled, printed and provided to the patient in AVS. Patient instructions and summary information was reviewed with the patient as documented in the AVS. This note was prepared with assistance of Dragon voice recognition software. Occasional wrong-word or sound-a-like substitutions may have occurred due to the inherent limitations of voice recognition software

## 2023-02-08 DIAGNOSIS — Z950 Presence of cardiac pacemaker: Secondary | ICD-10-CM | POA: Diagnosis not present

## 2023-02-08 DIAGNOSIS — Z0279 Encounter for issue of other medical certificate: Secondary | ICD-10-CM

## 2023-02-08 DIAGNOSIS — I442 Atrioventricular block, complete: Secondary | ICD-10-CM | POA: Diagnosis not present

## 2023-02-08 LAB — COMPREHENSIVE METABOLIC PANEL
ALT: 13 U/L (ref 0–35)
AST: 15 U/L (ref 0–37)
Albumin: 4.4 g/dL (ref 3.5–5.2)
Alkaline Phosphatase: 90 U/L (ref 39–117)
BUN: 44 mg/dL — ABNORMAL HIGH (ref 6–23)
CO2: 27 mEq/L (ref 19–32)
Calcium: 9.6 mg/dL (ref 8.4–10.5)
Chloride: 97 mEq/L (ref 96–112)
Creatinine, Ser: 1.74 mg/dL — ABNORMAL HIGH (ref 0.40–1.20)
GFR: 26.81 mL/min — ABNORMAL LOW (ref >60.00)
Glucose, Bld: 168 mg/dL — ABNORMAL HIGH (ref 70–99)
Potassium: 4.7 mEq/L (ref 3.5–5.1)
Sodium: 135 mEq/L (ref 135–145)
Total Bilirubin: 0.5 mg/dL (ref 0.2–1.2)
Total Protein: 7.6 g/dL (ref 6.0–8.3)

## 2023-02-08 LAB — CBC WITH DIFFERENTIAL/PLATELET
Basophils Absolute: 0.1 10*3/uL (ref 0.0–0.1)
Basophils Relative: 0.7 % (ref 0.0–3.0)
Eosinophils Absolute: 0.1 10*3/uL (ref 0.0–0.7)
Eosinophils Relative: 1.3 % (ref 0.0–5.0)
HCT: 34.7 % — ABNORMAL LOW (ref 36.0–46.0)
Hemoglobin: 10.7 g/dL — ABNORMAL LOW (ref 12.0–15.0)
Lymphocytes Relative: 18.7 % (ref 12.0–46.0)
Lymphs Abs: 1.3 10*3/uL (ref 0.7–4.0)
MCHC: 30.8 g/dL (ref 30.0–36.0)
MCV: 78.6 fl (ref 78.0–100.0)
Monocytes Absolute: 0.5 10*3/uL (ref 0.1–1.0)
Monocytes Relative: 7.9 % (ref 3.0–12.0)
Neutro Abs: 4.8 10*3/uL (ref 1.4–7.7)
Neutrophils Relative %: 71.4 % (ref 43.0–77.0)
Platelets: 285 10*3/uL (ref 150.0–400.0)
RBC: 4.42 Mil/uL (ref 3.87–5.11)
RDW: 16.1 % — ABNORMAL HIGH (ref 11.5–15.5)
WBC: 6.8 10*3/uL (ref 4.0–10.5)

## 2023-02-08 LAB — URIC ACID: Uric Acid, Serum: 6.4 mg/dL (ref 2.4–7.0)

## 2023-02-08 LAB — MAGNESIUM: Magnesium: 2.4 mg/dL (ref 1.5–2.5)

## 2023-02-08 LAB — LIPID PANEL
Cholesterol: 142 mg/dL (ref 0–200)
HDL: 52.9 mg/dL (ref >39.00)
LDL Cholesterol: 60 mg/dL (ref 0–99)
NonHDL: 89.36
Total CHOL/HDL Ratio: 3
Triglycerides: 149 mg/dL (ref 0.0–149.0)
VLDL: 29.8 mg/dL (ref 0.0–40.0)

## 2023-02-08 LAB — TSH: TSH: 5.15 u[IU]/mL (ref 0.35–5.50)

## 2023-02-08 LAB — VITAMIN B12: Vitamin B-12: 415 pg/mL (ref 211–911)

## 2023-02-08 NOTE — Progress Notes (Signed)
See mychart note Dear Ms. Nedra Hai, Your lab results are all stable.  I am hopeful that increasing the dose of your Marcelline Deist will help manage her diabetes better.  Your vitamin B12 level is fairly good, you may take your regular B12 supplement every other day.  Let me know if you have any gout flares.  Your magnesium level is normal.  Your kidney function is stable.  No other changes are needed at this time. Sincerely, Dr. Mardelle Matte

## 2023-03-08 ENCOUNTER — Other Ambulatory Visit: Payer: Self-pay | Admitting: Family Medicine

## 2023-03-14 ENCOUNTER — Other Ambulatory Visit: Payer: Self-pay | Admitting: Family Medicine

## 2023-03-14 NOTE — Telephone Encounter (Signed)
LOV: 02/07/2023  Last Fill Date: 02/07/2023  Qty: 60  Refills: 0

## 2023-03-28 DIAGNOSIS — E1169 Type 2 diabetes mellitus with other specified complication: Secondary | ICD-10-CM | POA: Diagnosis not present

## 2023-03-28 DIAGNOSIS — I10 Essential (primary) hypertension: Secondary | ICD-10-CM | POA: Diagnosis not present

## 2023-03-28 DIAGNOSIS — E785 Hyperlipidemia, unspecified: Secondary | ICD-10-CM | POA: Diagnosis not present

## 2023-03-28 DIAGNOSIS — E1159 Type 2 diabetes mellitus with other circulatory complications: Secondary | ICD-10-CM | POA: Diagnosis not present

## 2023-03-30 ENCOUNTER — Ambulatory Visit (INDEPENDENT_AMBULATORY_CARE_PROVIDER_SITE_OTHER): Payer: Medicare Other | Admitting: Podiatry

## 2023-03-30 DIAGNOSIS — E1142 Type 2 diabetes mellitus with diabetic polyneuropathy: Secondary | ICD-10-CM

## 2023-03-30 DIAGNOSIS — B351 Tinea unguium: Secondary | ICD-10-CM

## 2023-03-30 DIAGNOSIS — M79674 Pain in right toe(s): Secondary | ICD-10-CM | POA: Diagnosis not present

## 2023-03-30 DIAGNOSIS — M79675 Pain in left toe(s): Secondary | ICD-10-CM

## 2023-03-30 DIAGNOSIS — I96 Gangrene, not elsewhere classified: Secondary | ICD-10-CM

## 2023-03-30 NOTE — Progress Notes (Signed)
This patient returns to my office for at risk foot care.  This patient requires this care by a professional since this patient will be at risk due to having  Diabetes and PAD.  Patient is taking plavix.  This patient is unable to cut nails herself since the patient cannot reach her nails . These nails are painful walking and wearing shoes.  This patient presents for at risk foot care today.  General Appearance  Alert, conversant and in no acute stress.  Vascular  Dorsalis pedis and posterior tibial  pulses are absent bilaterally.  Capillary return is within normal limits  bilaterally. Cold feet  bilaterally.  Neurologic  Senn-Weinstein monofilament wire absent   bilaterally. Muscle power within normal limits bilaterally.  Nails Thick disfigured discolored nails with subungual debris  from hallux to fifth toes bilaterally. No evidence of bacterial infection or drainage bilaterally.  Orthopedic  No limitations of motion  feet .  No crepitus or effusions noted.  No bony pathology or digital deformities noted.  Skin  normotropic skin with no porokeratosis noted bilaterally.  No signs of infections or ulcers noted.  Skin necrosis medial aspect heels  B/L.   Onychomycosis  Pain in right toes  Pain in left toes   Skin necrosis  B/L.  Consent was obtained for treatment procedures.   Mechanical debridement of nails 1-5  bilaterally performed with a nail nipper.  Filed with dremel without incident. No infection or ulcer.  Dispense heel cushion for sleep to protect skin on her heels.   Return office visit  3  months        Told patient to return for periodic foot care and evaluation due to potential at risk complications.   Helane Gunther DPM

## 2023-04-18 ENCOUNTER — Other Ambulatory Visit: Payer: Self-pay | Admitting: Family Medicine

## 2023-04-19 ENCOUNTER — Other Ambulatory Visit: Payer: Self-pay | Admitting: Family Medicine

## 2023-04-22 ENCOUNTER — Encounter: Payer: Medicare Other | Admitting: Pharmacist

## 2023-04-29 ENCOUNTER — Other Ambulatory Visit: Payer: Self-pay | Admitting: Family Medicine

## 2023-05-10 DIAGNOSIS — I442 Atrioventricular block, complete: Secondary | ICD-10-CM | POA: Diagnosis not present

## 2023-05-10 DIAGNOSIS — Z45018 Encounter for adjustment and management of other part of cardiac pacemaker: Secondary | ICD-10-CM | POA: Diagnosis not present

## 2023-05-17 ENCOUNTER — Other Ambulatory Visit: Payer: Self-pay | Admitting: Family

## 2023-05-18 ENCOUNTER — Encounter: Payer: Self-pay | Admitting: Family Medicine

## 2023-05-20 ENCOUNTER — Telehealth: Payer: Self-pay

## 2023-05-20 ENCOUNTER — Other Ambulatory Visit: Payer: Self-pay | Admitting: Family Medicine

## 2023-05-20 MED ORDER — HYDROCODONE-ACETAMINOPHEN 5-325 MG PO TABS: ORAL_TABLET | ORAL | 0 refills | Status: DC

## 2023-05-20 NOTE — Telephone Encounter (Signed)
Spoke with andy and she wanted to see if you could send in the the pts hydrocodone for here bc she was not at her computer  Thank you,  Christy Gentles

## 2023-05-20 NOTE — Telephone Encounter (Signed)
Happy to help-sent

## 2023-05-20 NOTE — Addendum Note (Signed)
Addended by: Shelva Majestic on: 05/20/2023 04:57 PM   Modules accepted: Orders

## 2023-05-22 NOTE — Telephone Encounter (Signed)
Dr. Durene Cal filled her hydrocodone. Thanks.

## 2023-05-24 ENCOUNTER — Other Ambulatory Visit: Payer: Self-pay | Admitting: Family Medicine

## 2023-05-30 ENCOUNTER — Encounter: Payer: Self-pay | Admitting: Family Medicine

## 2023-06-06 ENCOUNTER — Encounter: Payer: Self-pay | Admitting: Family Medicine

## 2023-06-06 ENCOUNTER — Ambulatory Visit: Payer: Medicare Other | Admitting: Family Medicine

## 2023-06-06 VITALS — BP 128/68 | HR 79 | Temp 98.1°F | Ht 63.0 in | Wt 208.0 lb

## 2023-06-06 DIAGNOSIS — F02A0 Dementia in other diseases classified elsewhere, mild, without behavioral disturbance, psychotic disturbance, mood disturbance, and anxiety: Secondary | ICD-10-CM

## 2023-06-06 DIAGNOSIS — Z23 Encounter for immunization: Secondary | ICD-10-CM

## 2023-06-06 DIAGNOSIS — G301 Alzheimer's disease with late onset: Secondary | ICD-10-CM | POA: Diagnosis not present

## 2023-06-06 DIAGNOSIS — E1140 Type 2 diabetes mellitus with diabetic neuropathy, unspecified: Secondary | ICD-10-CM

## 2023-06-06 DIAGNOSIS — F1120 Opioid dependence, uncomplicated: Secondary | ICD-10-CM

## 2023-06-06 DIAGNOSIS — E119 Type 2 diabetes mellitus without complications: Secondary | ICD-10-CM

## 2023-06-06 DIAGNOSIS — G8929 Other chronic pain: Secondary | ICD-10-CM | POA: Diagnosis not present

## 2023-06-06 DIAGNOSIS — E1142 Type 2 diabetes mellitus with diabetic polyneuropathy: Secondary | ICD-10-CM

## 2023-06-06 DIAGNOSIS — M5441 Lumbago with sciatica, right side: Secondary | ICD-10-CM | POA: Diagnosis not present

## 2023-06-06 DIAGNOSIS — I152 Hypertension secondary to endocrine disorders: Secondary | ICD-10-CM | POA: Diagnosis not present

## 2023-06-06 DIAGNOSIS — E1159 Type 2 diabetes mellitus with other circulatory complications: Secondary | ICD-10-CM

## 2023-06-06 DIAGNOSIS — Z7984 Long term (current) use of oral hypoglycemic drugs: Secondary | ICD-10-CM

## 2023-06-06 DIAGNOSIS — M5442 Lumbago with sciatica, left side: Secondary | ICD-10-CM

## 2023-06-06 LAB — POCT GLYCOSYLATED HEMOGLOBIN (HGB A1C): Hemoglobin A1C: 7.1 % — AB (ref 4.0–5.6)

## 2023-06-06 MED ORDER — GLIPIZIDE ER 2.5 MG PO TB24: 2.5000 mg | ORAL_TABLET | Freq: Every day | ORAL | 2 refills | Status: DC

## 2023-06-06 MED ORDER — ATORVASTATIN CALCIUM 40 MG PO TABS: 40.0000 mg | ORAL_TABLET | Freq: Every day | ORAL | 3 refills | Status: DC

## 2023-06-06 NOTE — Progress Notes (Signed)
Subjective  CC:  Chief Complaint  Patient presents with   Diabetes    Pt here to discuss medication change, Farxiga too expensive, pt stopped medication 2 weeks ago, would like to discuss other options.     HPI: Krista Clarke is a 82 y.o. female who presents to the office today for follow up of diabetes and problems listed above in the chief complaint.  Diabetes follow up: Her diabetic control is reported as Unchanged. However, ran out of farxiga and now can't afford due to medicare donut hole. Fasting sugar was 161 this am.  She denies exertional CP or SOB or symptomatic hypoglycemia. She denies foot sores or paresthesias. Eligible for flu shot today.  Mood: + PHQ9 today. Denies depression; but more tired. Has chronic pain which makes it difficult. Denies being sedated from norco.  HTN: no cp or sob  Wt Readings from Last 3 Encounters:  06/06/23 208 lb (94.3 kg)  02/07/23 199 lb 3.2 oz (90.4 kg)  11/08/22 195 lb (88.5 kg)    BP Readings from Last 3 Encounters:  06/06/23 128/68  02/07/23 (!) 152/64  11/08/22 129/65    Assessment  1. Type 2 diabetes, controlled, with neuropathy (HCC)   2. Need for influenza vaccination   3. Diabetic polyneuropathy associated with type 2 diabetes mellitus (HCC)   4. Diabetes mellitus treated with oral medication (HCC)   5. Chronic midline low back pain with bilateral sciatica   6. Mild late onset Alzheimer's dementia without behavioral disturbance, psychotic disturbance, mood disturbance, or anxiety (HCC)   7. Chronic narcotic dependence (HCC)   8. Hypertension associated with diabetes (HCC)      Plan  Diabetes is currently well controlled. But will change to gluctorol xl 2.5 mg daily until can get farxiga again: gave pt [employer] patient assistance forms to complete. Discussed risks of low blood sugars if skips meals.  Flu shot today Bp is perfect Continue chronic narcotics. No falls HR Monitor mood. No meds today.   Follow up: as  scheduled  08/08/2023  Orders Placed This Encounter  Procedures   Flu Vaccine Trivalent High Dose (Fluad)   Meds ordered this encounter  Medications   atorvastatin (LIPITOR) 40 MG tablet    Sig: Take 1 tablet (40 mg total) by mouth at bedtime.    Dispense:  90 tablet    Refill:  3   glipiZIDE (GLUCOTROL XL) 2.5 MG 24 hr tablet    Sig: Take 1 tablet (2.5 mg total) by mouth daily with breakfast.    Dispense:  30 tablet    Refill:  2      Immunization History  Administered Date(s) Administered   Fluad Quad(high Dose 65+) 06/19/2019, 05/09/2020, 06/09/2021, 07/21/2022   Fluad Trivalent(High Dose 65+) 06/06/2023   Influenza, High Dose Seasonal PF 06/30/2017, 05/12/2018   PFIZER(Purple Top)SARS-COV-2 Vaccination 10/06/2019, 10/29/2019, 06/13/2020   Pfizer Covid-19 Vaccine Bivalent Booster 82yrs & up 06/30/2021, 08/07/2022   Pneumococcal Conjugate-13 05/12/2018   Pneumococcal Polysaccharide-23 07/13/2019   RSV,unspecified 08/07/2022   Tdap 06/15/2018   Zoster Recombinant(Shingrix) 07/30/2019, 01/17/2020    Diabetes Related Lab Review: Lab Results  Component Value Date   HGBA1C 7.8 (A) 02/07/2023   HGBA1C 7.8 (A) 10/22/2022   HGBA1C 7.3 (A) 07/21/2022    Lab Results  Component Value Date   MICROALBUR 2.4 (H) 01/18/2022   Lab Results  Component Value Date   CREATININE 1.74 (H) 02/07/2023   BUN 44 (H) 02/07/2023   NA 135 02/07/2023  K 4.7 02/07/2023   CL 97 02/07/2023   CO2 27 02/07/2023   Lab Results  Component Value Date   CHOL 142 02/07/2023   CHOL 120 01/18/2022   CHOL 126 12/03/2020   Lab Results  Component Value Date   HDL 52.90 02/07/2023   HDL 49.90 01/18/2022   HDL 49.90 12/03/2020   Lab Results  Component Value Date   LDLCALC 60 02/07/2023   LDLCALC 45 01/18/2022   LDLCALC 50 12/03/2020   Lab Results  Component Value Date   TRIG 149.0 02/07/2023   TRIG 124.0 01/18/2022   TRIG 127.0 12/03/2020   Lab Results  Component Value Date   CHOLHDL  3 02/07/2023   CHOLHDL 2 01/18/2022   CHOLHDL 3 12/03/2020   No results found for: "LDLDIRECT" The ASCVD Risk score (Arnett DK, et al., 2019) failed to calculate for the following reasons:   The 2019 ASCVD risk score is only valid for ages 40 to 25 I have reviewed the PMH, Fam and Soc history. Patient Active Problem List   Diagnosis Date Noted Date Diagnosed   Stage 3b chronic kidney disease (HCC) 02/07/2023     Priority: High   Diabetic nephropathy associated with type 2 diabetes mellitus (HCC) 07/21/2022     Priority: High   Complete heart block (HCC) 09/21/2021     Priority: High    Pacemaker dependent, Dr. Jacinto Halim    Lumbar stenosis 10/31/2019     Priority: High    Dr. August Saucer 2010 lumbar MRI, multilevel and severe. Failed cymbalta, PT, lyrica and gabapentin. Treated at [facility] with epidural spinal injections    Chronic narcotic dependence (HCC) 07/13/2019     Priority: High   Mild late onset Alzheimer's dementia without behavioral disturbance, psychotic disturbance, mood disturbance, or anxiety (HCC) 09/18/2018     Priority: High   PAD (peripheral artery disease) (HCC) 11/18/2016     Priority: High    MILD: LE [place] on 04/29/16, mild arterial occlusive disease. Mild right iliac arterial disease and mild left distal popliteal disease. Right ABI 1.06, Left ABI 1.07.    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  AP 29 %, VP 99%.  There were 3 brief mode switches, longest 6 seconds, brief AT.  No high ventricular rate episodes.  Normal pacemaker function.     Scheduled  In office pacemaker check 11/08/2022 Single (S)/Dual (D)/BV: D. Presenting ASVP @ 75/min. Pacemaker dependant:  Yes. Underlying CHB. AP 23%, VP 100% AMS Episodes  o  HVR 0  Longevity 3.3 Years. Magnet rate: >85%. Lead measurements: Stable. Histogram: Low (L)/normal (N)/high (H)  Normal. Patient activity Good.   Observations: Normal pacemaker function. Changes: None.     Gout 12/03/2020     Priority: Medium  Chronic venous insufficiency 10/17/2020     Priority: Medium    Urinary incontinence in female, followed by GYN, did not tolerate pessary 06/27/2018     Priority: Medium    LBBB (left bundle branch block) 11/18/2016     Priority: Medium     EKG 07/08/16: Sinus rhythm , rate 80, left axis deviation, left anterior fascicular block, left bundle branch block.    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 She has never used smokeless tobacco. She reports that she does not drink alcohol and does not use drugs.  Review of Systems: Ophthalmic: negative for eye pain, loss of vision or double vision Cardiovascular: negative for chest pain Respiratory: negative for SOB or persistent cough Gastrointestinal: negative for abdominal pain Genitourinary: negative for dysuria or gross hematuria MSK: negative for foot lesions Neurologic: negative for weakness or gait  disturbance  Objective  Vitals: BP 128/68 Comment: right arm seated/cla  Pulse 79   Temp 98.1 F (36.7 C)   Ht 5\' 3"  (1.6 m)   Wt 208 lb (94.3 kg)   LMP  (LMP Unknown)   SpO2 95%   BMI 36.85 kg/m  General: well appearing, no acute distress  Psych:  Alert and oriented, normal mood and affect HEENT:  Normocephalic, atraumatic, moist mucous membranes, supple neck  Cardiovascular:  Nl S1 and S2, RRR without murmur, gallop or rub. no edema Respiratory:  Good breath sounds bilaterally, CTAB with normal effort, no rales   Diabetic education: ongoing education regarding chronic disease management for diabetes was given today. We continue to reinforce the ABC's of diabetic management: A1c (<7 or 8 dependent upon patient), tight blood pressure control, and cholesterol management with goal LDL < 100 minimally. We discuss diet strategies, exercise recommendations, medication options and possible side effects. At each visit, we review recommended immunizations and preventive care recommendations for diabetics and stress that good diabetic control can prevent other problems. See below for this patient's data.   Commons side effects, risks, benefits, and alternatives for medications and treatment plan prescribed today were discussed, and the patient expressed understanding of the given instructions. Patient is instructed to call or message via MyChart if he/she has any questions or concerns regarding our treatment plan. No barriers to understanding were identified. We discussed Red Flag symptoms and signs in detail. Patient expressed understanding regarding what to do in case of urgent or emergency type symptoms.  Medication list was reconciled, printed and provided to the patient in AVS. Patient instructions and summary information was reviewed with the patient as documented in the AVS. This note was prepared with assistance of Dragon voice recognition software. Occasional wrong-word or sound-a-like  substitutions may have occurred due to the inherent limitations of voice recognition software

## 2023-06-06 NOTE — Addendum Note (Signed)
Addended by: Samara Deist on: 06/06/2023 01:57 PM   Modules accepted: Orders

## 2023-06-06 NOTE — Telephone Encounter (Signed)
High dose flu vaccine was given to pt during office visit today

## 2023-06-24 ENCOUNTER — Telehealth: Payer: Self-pay | Admitting: Family Medicine

## 2023-06-24 NOTE — Telephone Encounter (Signed)
Prescription Request  06/24/2023  LOV: 06/06/2023  What is the name of the medication or equipment?  HYDROcodone-acetaminophen (NORCO/VICODIN) 5-325 MG tablet   Have you contacted your pharmacy to request a refill? Yes   Which pharmacy would you like this sent to? [facility] - [place], [place] - [address] [place] [address] Phone: [phone] Fax: [phone]   Patient notified that their request is being sent to the clinical staff for review and that they should receive a response within 2 business days.   Please advise at Mobile [phone] (mobile)

## 2023-06-27 ENCOUNTER — Encounter: Payer: Self-pay | Admitting: Family Medicine

## 2023-06-27 MED ORDER — HYDROCODONE-ACETAMINOPHEN 5-325 MG PO TABS: ORAL_TABLET | ORAL | 0 refills | Status: DC

## 2023-07-04 ENCOUNTER — Ambulatory Visit: Payer: Medicare Other | Admitting: Podiatry

## 2023-07-11 ENCOUNTER — Other Ambulatory Visit: Payer: Self-pay

## 2023-07-11 ENCOUNTER — Encounter (HOSPITAL_COMMUNITY): Payer: Self-pay

## 2023-07-11 ENCOUNTER — Ambulatory Visit (INDEPENDENT_AMBULATORY_CARE_PROVIDER_SITE_OTHER): Payer: Medicare Other | Admitting: Physician Assistant

## 2023-07-11 ENCOUNTER — Emergency Department (HOSPITAL_COMMUNITY)
Admission: EM | Admit: 2023-07-11 | Discharge: 2023-07-11 | Disposition: A | Discharge status: Home / Self Care | Payer: Medicare Other | Attending: Emergency Medicine | Admitting: Emergency Medicine

## 2023-07-11 ENCOUNTER — Emergency Department (HOSPITAL_COMMUNITY): Payer: Medicare Other

## 2023-07-11 VITALS — BP 146/82 | HR 82 | Temp 98.0°F | Ht 63.0 in | Wt 215.2 lb

## 2023-07-11 DIAGNOSIS — R531 Weakness: Secondary | ICD-10-CM | POA: Diagnosis not present

## 2023-07-11 DIAGNOSIS — Z7982 Long term (current) use of aspirin: Secondary | ICD-10-CM | POA: Insufficient documentation

## 2023-07-11 DIAGNOSIS — R635 Abnormal weight gain: Secondary | ICD-10-CM

## 2023-07-11 DIAGNOSIS — R600 Localized edema: Secondary | ICD-10-CM | POA: Insufficient documentation

## 2023-07-11 DIAGNOSIS — R0789 Other chest pain: Secondary | ICD-10-CM | POA: Diagnosis not present

## 2023-07-11 DIAGNOSIS — Z950 Presence of cardiac pacemaker: Secondary | ICD-10-CM | POA: Diagnosis not present

## 2023-07-11 DIAGNOSIS — Z79899 Other long term (current) drug therapy: Secondary | ICD-10-CM | POA: Insufficient documentation

## 2023-07-11 DIAGNOSIS — Z7984 Long term (current) use of oral hypoglycemic drugs: Secondary | ICD-10-CM | POA: Insufficient documentation

## 2023-07-11 DIAGNOSIS — M7989 Other specified soft tissue disorders: Secondary | ICD-10-CM | POA: Diagnosis present

## 2023-07-11 DIAGNOSIS — R0602 Shortness of breath: Secondary | ICD-10-CM | POA: Diagnosis not present

## 2023-07-11 LAB — TROPONIN I (HIGH SENSITIVITY): Troponin I (High Sensitivity): 7 ng/L (ref <18)

## 2023-07-11 LAB — CBC WITH DIFFERENTIAL/PLATELET
Abs Immature Granulocytes: 0.03 10*3/uL (ref 0.00–0.07)
Basophils Absolute: 0 10*3/uL (ref 0.0–0.1)
Basophils Relative: 0 %
Eosinophils Absolute: 0.1 10*3/uL (ref 0.0–0.5)
Eosinophils Relative: 1 %
HCT: 32.2 % — ABNORMAL LOW (ref 36.0–46.0)
Hemoglobin: 9.7 g/dL — ABNORMAL LOW (ref 12.0–15.0)
Immature Granulocytes: 1 %
Lymphocytes Relative: 15 %
Lymphs Abs: 0.9 10*3/uL (ref 0.7–4.0)
MCH: 24.9 pg — ABNORMAL LOW (ref 26.0–34.0)
MCHC: 30.1 g/dL (ref 30.0–36.0)
MCV: 82.6 fL (ref 80.0–100.0)
Monocytes Absolute: 0.4 10*3/uL (ref 0.1–1.0)
Monocytes Relative: 6 %
Neutro Abs: 5 10*3/uL (ref 1.7–7.7)
Neutrophils Relative %: 77 %
Platelets: 261 10*3/uL (ref 150–400)
RBC: 3.9 MIL/uL (ref 3.87–5.11)
RDW: 15.9 % — ABNORMAL HIGH (ref 11.5–15.5)
WBC: 6.4 10*3/uL (ref 4.0–10.5)
nRBC: 0 % (ref 0.0–0.2)

## 2023-07-11 LAB — COMPREHENSIVE METABOLIC PANEL
ALT: 14 U/L (ref 0–44)
AST: 20 U/L (ref 15–41)
Albumin: 4.2 g/dL (ref 3.5–5.0)
Alkaline Phosphatase: 76 U/L (ref 38–126)
Anion gap: 10 (ref 5–15)
BUN: 51 mg/dL — ABNORMAL HIGH (ref 8–23)
CO2: 25 mmol/L (ref 22–32)
Calcium: 9.2 mg/dL (ref 8.9–10.3)
Chloride: 103 mmol/L (ref 98–111)
Creatinine, Ser: 1.61 mg/dL — ABNORMAL HIGH (ref 0.44–1.00)
GFR, Estimated: 32 mL/min — ABNORMAL LOW (ref >60)
Glucose, Bld: 158 mg/dL — ABNORMAL HIGH (ref 70–99)
Potassium: 4.5 mmol/L (ref 3.5–5.1)
Sodium: 138 mmol/L (ref 135–145)
Total Bilirubin: 0.6 mg/dL (ref <1.2)
Total Protein: 7.6 g/dL (ref 6.5–8.1)

## 2023-07-11 LAB — URINALYSIS, ROUTINE W REFLEX MICROSCOPIC
Bilirubin Urine: NEGATIVE
Glucose, UA: >=500 mg/dL — AB
Hgb urine dipstick: NEGATIVE
Ketones, ur: NEGATIVE mg/dL
Nitrite: NEGATIVE
Protein, ur: NEGATIVE mg/dL
Specific Gravity, Urine: 1.01 (ref 1.005–1.030)
pH: 5 (ref 5.0–8.0)

## 2023-07-11 LAB — LIPASE, BLOOD: Lipase: 30 U/L (ref 11–51)

## 2023-07-11 LAB — BRAIN NATRIURETIC PEPTIDE: B Natriuretic Peptide: 97.7 pg/mL (ref 0.0–100.0)

## 2023-07-11 MED ORDER — FUROSEMIDE 40 MG PO TABS: 20.0000 mg | ORAL_TABLET | Freq: Once | ORAL | Status: DC

## 2023-07-11 MED ORDER — FUROSEMIDE 40 MG PO TABS
80.0000 mg | ORAL_TABLET | Freq: Once | ORAL | Status: AC
  Administered 2023-07-11: 80 mg via ORAL
  Filled 2023-07-11: qty 2

## 2023-07-11 NOTE — Progress Notes (Signed)
1   Patient ID: Krista Clarke, female    DOB: 1940/09/10, 82 y.o.   MRN: [id number]   Assessment & Plan:  Bilateral lower extremity edema  Chest tightness  Pacemaker; 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 No chest pain or dyspnea. Pacemaker in place since 2015. No history of heart failure. -Refer to emergency department for immediate evaluation including BNP, troponin, EKG, and chest x-ray. -Consider IV diuretics for rapid fluid removal.    Subjective:    Chief Complaint  Patient presents with   Leg Swelling    Both legs are in pain and are swollen. Bottom of feet has some concerning spots    HPI  Accompanied by her daughter today.  Discussed the use of AI scribe software for clinical note transcription with the patient, who gave verbal consent to proceed.  History of Present Illness   Krista Clarke, a patient with a history of pacemaker placement, presents with a week-long history of worsening bilateral lower extremity edema. She reports that her ankles and legs have been swelling, causing difficulty in wearing shoes and associated discomfort. This is the first time she has experienced such severe swelling. She denies any weeping or clear fluid discharge from the swollen areas.  In addition to the edema, she reports a change in her heart rhythm, describing it as faster and different from her usual rhythm. She denies any associated chest pain, says it just feels tight. Despite the swelling, she continues to ambulate for essential activities such as going to the bathroom and kitchen. However, she has been feeling extremely fatigued recently, which is a change from her usual state. She has been preferring to wash up instead of her usual showers due to this fatigue.  She also reports  feeling colder than usual and a decrease in her appetite. She is currently on Lasix 40mg  daily and reports her urine color as a normal clear yellow, although she describes it as strong. She denies any abdominal pain or swelling in her upper legs.       Past Medical History:  Diagnosis Date   Anemia    takes iron daily   Arthritis    Complete heart block (HCC) 08/20/2015   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     Diabetes mellitus without complication (HCC)    Encounter for care of pacemaker 05/24/2019   Hypercholesteremia    Hypertension    Pacemaker; 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 August Saucer 2010 lumbar MRI, multilevel and severe.    Past Surgical History:  Procedure Laterality Date   ABDOMINAL HYSTERECTOMY     COLONOSCOPY WITH PROPOFOL N/A 07/03/2013   Procedure: COLONOSCOPY WITH PROPOFOL;  Surgeon: Charolett Bumpers, MD;  Location: [facility] ENDOSCOPY;  Service: Endoscopy;  Laterality: N/A;   EP IMPLANTABLE DEVICE N/A 08/20/2015   Procedure: Pacemaker Implant;  Surgeon: Marinus Maw, MD;  Location: [facility] INVASIVE CV LAB;  Service: Cardiovascular;  Laterality: N/A;   JOINT REPLACEMENT Bilateral    '99. Knees   LEFT HEART CATHETERIZATION WITH CORONARY ANGIOGRAM N/A 08/27/2014   Procedure: LEFT HEART CATHETERIZATION WITH CORONARY ANGIOGRAM;  Surgeon: Tyrone Nine  Tomasita Crumble, MD;  Location: [facility] CATH LAB;  Service: Cardiovascular;  Laterality: N/A;   TUBAL LIGATION      Family History  Problem Relation Age of Onset   Hypertension Mother    Diabetes Mother    Hypertension Father    Cancer Brother    Breast cancer Granddaughter        30s    Social History   Tobacco Use   Smoking status: Never   Smokeless tobacco: Never  Vaping Use   Vaping status: Never Used  Substance Use Topics   Alcohol use: No   Drug use: No     Allergies  Allergen  Reactions   Prednisone Other (See Comments), Palpitations and Rash    Doesn't work well for patient    Review of Systems NEGATIVE UNLESS OTHERWISE INDICATED IN HPI      Objective:     BP (!) 146/82   Pulse 82   Temp 98 F (36.7 C)   Ht 5\' 3"  (1.6 m)   Wt 215 lb 3.2 oz (97.6 kg)   LMP  (LMP Unknown)   SpO2 98%   BMI 38.12 kg/m   Wt Readings from Last 3 Encounters:  07/11/23 215 lb 3.2 oz (97.6 kg)  06/06/23 208 lb (94.3 kg)  02/07/23 199 lb 3.2 oz (90.4 kg)    BP Readings from Last 3 Encounters:  07/11/23 (!) 146/82  06/06/23 128/68  02/07/23 (!) 152/64     Physical Exam Vitals and nursing note reviewed.  Constitutional:      Appearance: Normal appearance. She is normal weight. She is not toxic-appearing.     Comments: Pleasant, sitting in wheelchair  HENT:     Head: Normocephalic and atraumatic.  Eyes:     Extraocular Movements: Extraocular movements intact.     Conjunctiva/sclera: Conjunctivae normal.     Pupils: Pupils are equal, round, and reactive to light.  Cardiovascular:     Rate and Rhythm: Normal rate and regular rhythm.     Pulses: Normal pulses.     Heart sounds: Normal heart sounds.  Pulmonary:     Effort: Pulmonary effort is normal.     Breath sounds: Examination of the right-lower field reveals decreased breath sounds. Examination of the left-lower field reveals decreased breath sounds. Decreased breath sounds present.  Musculoskeletal:        General: Normal range of motion.     Cervical back: Normal range of motion and neck supple.     Right lower leg: 3+ Edema present.     Left lower leg: 3+ Edema present.  Skin:    General: Skin is warm and dry.  Neurological:     General: No focal deficit present.     Mental Status: She is alert and oriented to person, place, and time.  Psychiatric:        Mood and Affect: Mood normal.        Behavior: Behavior normal.           Time Spent: 30 minutes of total time was spent on the date of  the encounter performing the following actions: chart review prior to seeing the patient, obtaining history, performing a medically necessary exam, counseling on the treatment plan, placing orders, and documenting in our EHR.       Tarun Patchell M Kelsy Polack, PA-C

## 2023-07-11 NOTE — ED Triage Notes (Signed)
Patient is here for evaluation after seeing PCP today and told to come to the ER for further evaluation. Pt initially went to PCP for increase bilateral leg swelling, increase in fatigue and weakness. Per PCP notes, concerned due to patient having a pacemaker and may potentially have new onset congestive heart failure. Pt also reports that she may have some shortness of breath. Per family, PCP made notes for provider in chart.

## 2023-07-11 NOTE — ED Notes (Signed)
Patient requesting to leave AMA. MD made aware.

## 2023-07-11 NOTE — Patient Instructions (Signed)
VISIT SUMMARY:  Miss Krista Clarke, during your visit today, we discussed your recent symptoms of swelling in your legs, changes in your heart rhythm, and increased fatigue. Given your history of a pacemaker, these symptoms are concerning and need immediate attention.  YOUR PLAN:  -POSSIBLE CONGESTIVE HEART FAILURE: Congestive heart failure occurs when the heart is unable to pump blood effectively, leading to fluid buildup in the body. Your symptoms of leg swelling, weight gain, and changes in heart rhythm suggest this condition. You need to go to the emergency department immediately for further evaluation and treatment, which may include tests like BNP, troponin, EKG, and a chest x-ray, and possibly IV diuretics to help remove excess fluid.  -GENERAL HEALTH MAINTENANCE: We noted your decreased appetite and increased fatigue. These symptoms will be monitored closely and reassessed after addressing the possible heart failure.  INSTRUCTIONS:  Please go to the emergency department immediately for further evaluation and treatment of your symptoms. Follow up with our office after your emergency visit to reassess your condition and discuss any further steps.

## 2023-07-11 NOTE — ED Provider Triage Note (Signed)
Emergency Medicine Provider Triage Evaluation Note  Krista Clarke , a 82 y.o. female  was evaluated in triage.  Pt complains of sent by PCP.  Review of Systems  Positive: LE edema, fatigue, gen weakness, mild DOE Negative: Fever, chest pain, vomiting  Physical Exam  BP (!) 151/68 (BP Location: Right Arm)   Pulse 79   Temp 98.1 F (36.7 C) (Oral)   Resp 16   Ht 5\' 3"  (1.6 m)   Wt 97.6 kg   LMP  (LMP Unknown)   SpO2 100%   BMI 38.12 kg/m  Gen:   Awake, no distress   Resp:  Normal effort  MSK:   Moves extremities without difficulty mild edema Other:    Medical Decision Making  Medically screening exam initiated at 12:23 PM.  Appropriate orders placed.  Krista Clarke was informed that the remainder of the evaluation will be completed by another provider, this initial triage assessment does not replace that evaluation, and the importance of remaining in the ED until their evaluation is complete.  ?new onset CHF, h/o pacemaker, abouta 15 pound weight gain in a month   Elpidio Anis, [place] 07/11/23 1226

## 2023-07-11 NOTE — ED Provider Notes (Signed)
[place] EMERGENCY DEPARTMENT AT [facility] Provider Note   CSN: [id number] Arrival date & time: 07/11/23  1140     History  Chief Complaint  Patient presents with   Weakness   Leg Swelling    Krista Clarke is a 82 y.o. female.  82 yo F with a chief complaints of bilateral leg swelling.  This been going on for the better part of the week.  She saw her family doctor today who is concerned and sent her to the ED for evaluation.  They said she has been less mobile this week.  They have been keeping her mostly recumbent in a recliner that they said tilts back pretty far.  They have been taking her 40 mg of Lasix but without significant improvement.  No obvious episodes that may have caused this preceding it.  They said typically she lives at home and does not leave the house unless family is present.  Spends most of her time in the same recliner.   Weakness      Home Medications Prior to Admission medications   Medication Sig Start Date End Date Taking? Authorizing Provider  allopurinol (ZYLOPRIM) 100 MG tablet TAKE 1 TABLET BY MOUTH EVERY MORNING 10/18/22   Willow Ora, MD  amLODipine (NORVASC) 5 MG tablet TAKE 1 TABLET BY MOUTH EVERY DAY 01/14/23   Willow Ora, MD  ascorbic acid (VITAMIN C) 500 MG tablet TAKE 1 TABLET BY MOUTH EVERY DAY 12/08/22   Willow Ora, MD  ASPIRIN LOW DOSE 81 MG chewable tablet CHEW ONE TABLET in MOUTH EVERY DAY 03/08/23   Willow Ora, MD  atorvastatin (LIPITOR) 40 MG tablet Take 1 tablet (40 mg total) by mouth at bedtime. 06/06/23   Willow Ora, MD  cyanocobalamin (VITAMIN B12) 1000 MCG tablet Take 1 tablet (1,000 mcg total) by mouth daily. 02/07/23   Willow Ora, MD  dapagliflozin propanediol (FARXIGA) 10 MG TABS tablet Take 1 tablet (10 mg total) by mouth daily. Patient not taking: Reported on 06/06/2023 02/07/23   Willow Ora, MD  diclofenac Sodium (VOLTAREN) 1 % GEL Apply 4 GRAMS topically 4 TIMES daily AS NEEDED FOR  PAIN 05/24/23   Willow Ora, MD  FEROSUL 325 (65 Fe) MG tablet TAKE 1 TABLET BY MOUTH 2 TIMES DAILY 03/08/23   Willow Ora, MD  furosemide (LASIX) 40 MG tablet TAKE 1 TABLET BY MOUTH EVERY DAY 04/19/23   Worthy Rancher B, FNP  gabapentin (NEURONTIN) 100 MG capsule Take 1-3 capsules (100-300 mg total) by mouth at bedtime. 02/07/23   Willow Ora, MD  glipiZIDE (GLUCOTROL XL) 2.5 MG 24 hr tablet Take 1 tablet (2.5 mg total) by mouth daily with breakfast. 06/06/23   Willow Ora, MD  HYDROcodone-acetaminophen (NORCO/VICODIN) 5-325 MG tablet TAKE 1 TABLET BY MOUTH 2 TIMES DAILY IN THE MORNING AND AT BEDTIME 06/27/23   Willow Ora, MD  losartan (COZAAR) 100 MG tablet Take 1 tablet (100 mg total) by mouth daily. 10/22/22   Willow Ora, MD  Lutein 20 MG CAPS TAKE 1 CAPSULE BY MOUTH EVERY DAY 12/08/22   Willow Ora, MD  magnesium oxide (MAG-OX) 400 (240 Mg) MG tablet TAKE 1 TABLET BY MOUTH EVERY DAY 04/29/23   Willow Ora, MD  meclizine (ANTIVERT) 12.5 MG tablet Take 1 tablet (12.5 mg total) by mouth 2 (two) times daily as needed for dizziness. 08/26/20   Willow Ora, MD  metoprolol succinate (  TOPROL-XL) 25 MG 24 hr tablet TAKE 1 TABLET BY MOUTH EVERY DAY 01/14/23   Willow Ora, MD  omeprazole (PRILOSEC) 20 MG capsule TAKE 1 CAPSULE BY MOUTH EVERY DAY BEFORE breakfast 11/12/22   Willow Ora, MD  triamcinolone (KENALOG) 0.1 % Apply 1 application topically 2 (two) times daily. For 2 weeks, then as needed 10/17/20   Willow Ora, MD      Allergies    Prednisone    Review of Systems   Review of Systems  Neurological:  Positive for weakness.    Physical Exam Updated Vital Signs BP (!) 185/79   Pulse 74   Temp (!) 97.4 F (36.3 C) (Oral)   Resp 13   Ht 5\' 3"  (1.6 m)   Wt 97.6 kg   LMP  (LMP Unknown)   SpO2 99%   BMI 38.12 kg/m  Physical Exam Vitals and nursing note reviewed.  Constitutional:      General: She is not in acute distress.    Appearance: She is  well-developed. She is not diaphoretic.  HENT:     Head: Normocephalic and atraumatic.  Eyes:     Pupils: Pupils are equal, round, and reactive to light.  Cardiovascular:     Rate and Rhythm: Normal rate and regular rhythm.     Heart sounds: No murmur heard.    No friction rub. No gallop.  Pulmonary:     Effort: Pulmonary effort is normal.     Breath sounds: No wheezing or rales.  Abdominal:     General: There is no distension.     Palpations: Abdomen is soft.     Tenderness: There is no abdominal tenderness.  Musculoskeletal:        General: No tenderness.     Cervical back: Normal range of motion and neck supple.     Right lower leg: Edema present.     Left lower leg: Edema present.     Comments: Pitting edema up to the thighs bilaterally.  Skin:    General: Skin is warm and dry.  Neurological:     Mental Status: She is alert and oriented to person, place, and time.  Psychiatric:        Behavior: Behavior normal.     ED Results / Procedures / Treatments   Labs (all labs ordered are listed, but only abnormal results are displayed) Labs Reviewed  CBC WITH DIFFERENTIAL/PLATELET - Abnormal; Notable for the following components:      Result Value   Hemoglobin 9.7 (*)    HCT 32.2 (*)    MCH 24.9 (*)    RDW 15.9 (*)    All other components within normal limits  COMPREHENSIVE METABOLIC PANEL - Abnormal; Notable for the following components:   Glucose, Bld 158 (*)    BUN 51 (*)    Creatinine, Ser 1.61 (*)    GFR, Estimated 32 (*)    All other components within normal limits  URINALYSIS, ROUTINE W REFLEX MICROSCOPIC - Abnormal; Notable for the following components:   APPearance HAZY (*)    Glucose, UA >=500 (*)    Leukocytes,Ua TRACE (*)    Bacteria, UA RARE (*)    All other components within normal limits  BRAIN NATRIURETIC PEPTIDE  LIPASE, BLOOD  TROPONIN I (HIGH SENSITIVITY)    EKG EKG Interpretation Date/Time:  Monday July 11 2023 12:44:12 EST Ventricular  Rate:  78 PR Interval:  194 QRS Duration:  157 QT Interval:  429 QTC Calculation:  489 R Axis:   -65  Text Interpretation: Sinus rhythm Left bundle branch block No significant change since last tracing Confirmed by Melene Plan [id number]) on 07/11/2023 3:02:56 PM  Radiology No results found.  Procedures Procedures    Medications Ordered in ED Medications  furosemide (LASIX) tablet 80 mg (80 mg Oral Given 07/11/23 1533)    ED Course/ Medical Decision Making/ A&P                                 Medical Decision Making Risk Prescription drug management.   82 yo F with a chief complaints of bilateral lower extremity edema.  This has been an ongoing issue for her.  She is on 40 mg of Lasix daily.  Has been compliant with this medication despite that had worsening leg swelling.  Has been spending most of her time in a recliner with her legs elevated.  This unfortunately has not helped and so they went to see their family doctor today who is worried that she needed IV diuresis and sent her to the ED for evaluation.  She has been having some shortness of breath on exertion with this.  Chest x-ray independently interpreted by me without obvious acute change to her heart size, no significant edema.  Renal function appears to be at baseline.  Hemoglobin appears to be at baseline.  She has a normal BNP.  Perhaps the patient is struggling from dependent edema.  Will obtain a singular troponin as she has been having some shortness of breath on exertion.  Will give a bolus dose of Lasix here.  Have her double her Lasix for 3 days.  The patient would prefer not to wait for her formal read of her chest x-ray or troponin.  Will have her follow-up with her family doctor in the office.  Encouraged to return at any time she wanted to.  4:46 PM:  I have discussed the diagnosis/risks/treatment options with the patient and family.  Evaluation and diagnostic testing in the emergency department does not  suggest an emergent condition requiring admission or immediate intervention beyond what has been performed at this time.  They will follow up with PCP. We also discussed returning to the ED immediately if new or worsening sx occur. We discussed the sx which are most concerning (e.g., sudden worsening pain, fever, inability to tolerate by mouth, sob, chest pain) that necessitate immediate return. Medications administered to the patient during their visit and any new prescriptions provided to the patient are listed below.  Medications given during this visit Medications  furosemide (LASIX) tablet 80 mg (80 mg Oral Given 07/11/23 1533)     The patient appears reasonably screen and/or stabilized for discharge and I doubt any other medical condition or other [facility] requiring further screening, evaluation, or treatment in the ED at this time prior to discharge.         Final Clinical Impression(s) / ED Diagnoses Final diagnoses:  Bilateral lower extremity edema    Rx / DC Orders ED Discharge Orders     None         Melene Plan, DO 07/11/23 1646

## 2023-07-11 NOTE — Discharge Instructions (Signed)
Double your Lasix for the next few days.  Please call your family doctor and see when they can see you back in the office.  Please return for worsening difficulty breathing chest pain.

## 2023-07-12 ENCOUNTER — Encounter: Payer: Self-pay | Admitting: Physician Assistant

## 2023-07-13 MED ORDER — FUROSEMIDE 80 MG PO TABS: ORAL_TABLET | ORAL | 0 refills | Status: DC

## 2023-07-13 NOTE — Addendum Note (Signed)
Addended by: Asencion Partridge on: 07/13/2023 12:28 PM   Modules accepted: Orders

## 2023-07-22 ENCOUNTER — Other Ambulatory Visit: Payer: Self-pay | Admitting: Family Medicine

## 2023-07-22 MED ORDER — GLIPIZIDE ER 2.5 MG PO TB24: 2.5000 mg | ORAL_TABLET | Freq: Every day | ORAL | 3 refills | Status: DC

## 2023-07-25 ENCOUNTER — Ambulatory Visit (INDEPENDENT_AMBULATORY_CARE_PROVIDER_SITE_OTHER): Payer: Medicare Other | Admitting: Physician Assistant

## 2023-07-25 VITALS — BP 148/77 | HR 84 | Temp 98.2°F | Ht 63.0 in | Wt 205.2 lb

## 2023-07-25 DIAGNOSIS — R600 Localized edema: Secondary | ICD-10-CM | POA: Diagnosis not present

## 2023-07-25 DIAGNOSIS — Z79899 Other long term (current) drug therapy: Secondary | ICD-10-CM | POA: Diagnosis not present

## 2023-07-25 NOTE — Progress Notes (Unsigned)
Patient ID: Krista Clarke, female    DOB: July 05, 1941, 82 y.o.   MRN: [id number]   Assessment & Plan:  There are no diagnoses linked to this encounter.      No follow-ups on file.    Subjective:    Chief Complaint  Patient presents with   Leg Swelling    Both legs are swollen but patients states it is better than it was before    HPI Patient is in today for ***  Past Medical History:  Diagnosis Date   Anemia    takes iron daily   Arthritis    Complete heart block (HCC) 08/20/2015   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     Diabetes mellitus without complication (HCC)    Encounter for care of pacemaker 05/24/2019   Hypercholesteremia    Hypertension    Pacemaker; 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 August Saucer 2010 lumbar MRI, multilevel and severe.    Past Surgical History:  Procedure Laterality Date   ABDOMINAL HYSTERECTOMY     COLONOSCOPY WITH PROPOFOL N/A 07/03/2013   Procedure: COLONOSCOPY WITH PROPOFOL;  Surgeon: Charolett Bumpers, MD;  Location: [facility] ENDOSCOPY;  Service: Endoscopy;  Laterality: N/A;   EP IMPLANTABLE DEVICE N/A 08/20/2015   Procedure: Pacemaker Implant;  Surgeon: Marinus Maw, MD;  Location: [facility] INVASIVE CV LAB;  Service: Cardiovascular;  Laterality: N/A;   JOINT REPLACEMENT Bilateral    '99. Knees   LEFT HEART CATHETERIZATION WITH CORONARY ANGIOGRAM N/A 08/27/2014   Procedure: LEFT HEART CATHETERIZATION WITH CORONARY ANGIOGRAM;  Surgeon: Pamella Pert, MD;  Location: [facility] CATH LAB;  Service: Cardiovascular;  Laterality: N/A;   TUBAL LIGATION      Family History  Problem Relation Age of Onset   Hypertension Mother    Diabetes Mother    Hypertension Father    Cancer Brother    Breast cancer Granddaughter        30s    Social History   Tobacco Use   Smoking status: Never   Smokeless tobacco: Never   Vaping Use   Vaping status: Never Used  Substance Use Topics   Alcohol use: No   Drug use: No     Allergies  Allergen Reactions   Prednisone Other (See Comments), Palpitations and Rash    Doesn't work well for patient    Review of Systems NEGATIVE UNLESS OTHERWISE INDICATED IN HPI      Objective:     BP (!) 148/77   Pulse 84   Temp 98.2 F (36.8 C) (Temporal)   Ht 5\' 3"  (1.6 m)   Wt 205 lb 3.2 oz (93.1 kg)   LMP  (LMP Unknown)   SpO2 99%   BMI 36.35 kg/m   Wt Readings from Last 3 Encounters:  07/25/23 205 lb 3.2 oz (93.1 kg)  07/11/23 215 lb 2.7 oz (97.6 kg)  07/11/23 215 lb 3.2 oz (97.6 kg)    BP Readings from Last 3 Encounters:  07/25/23 (!) 148/77  07/11/23 (!) 185/79  07/11/23 (!) 146/82     Physical Exam        Time Spent: *** minutes of total time was spent on the date of the encounter performing the following actions: chart review prior to seeing the patient, obtaining history, performing a medically necessary exam, counseling on the treatment  plan, placing orders, and documenting in our EHR.       Nirvan Laban M Aristidis Talerico, PA-C

## 2023-07-26 LAB — BASIC METABOLIC PANEL
BUN: 45 mg/dL — ABNORMAL HIGH (ref 6–23)
CO2: 28 meq/L (ref 19–32)
Calcium: 10 mg/dL (ref 8.4–10.5)
Chloride: 101 meq/L (ref 96–112)
Creatinine, Ser: 1.64 mg/dL — ABNORMAL HIGH (ref 0.40–1.20)
GFR: 28.7 mL/min — ABNORMAL LOW (ref >60.00)
Glucose, Bld: 139 mg/dL — ABNORMAL HIGH (ref 70–99)
Potassium: 4.3 meq/L (ref 3.5–5.1)
Sodium: 141 meq/L (ref 135–145)

## 2023-07-27 ENCOUNTER — Telehealth: Payer: Self-pay

## 2023-07-27 NOTE — Telephone Encounter (Signed)
Transition Care Management Unsuccessful Follow-up Telephone Call  Date of discharge and from where:  07/11/2023 [facility]  Attempts:  1st Attempt  Reason for unsuccessful TCM follow-up call:  No answer/busy  Chantavia Bazzle [facility]  [facility], [facility] Resource Care Guide Direct Dial: [phone]  Website: [email or web address]

## 2023-08-01 ENCOUNTER — Telehealth: Payer: Self-pay

## 2023-08-01 ENCOUNTER — Other Ambulatory Visit: Payer: Self-pay | Admitting: Family Medicine

## 2023-08-01 NOTE — Telephone Encounter (Signed)
Transition Care Management Unsuccessful Follow-up Telephone Call  Date of discharge and from where:  07/11/2023 [facility]  Attempts:  2nd Attempt  Reason for unsuccessful TCM follow-up call:  No answer/busy  Quyen Cutsforth [facility]  [facility], [facility] Resource Care Guide Direct Dial: [phone]  Website: [email or web address]

## 2023-08-08 ENCOUNTER — Ambulatory Visit: Payer: Medicare Other | Admitting: Family Medicine

## 2023-08-17 ENCOUNTER — Other Ambulatory Visit: Payer: Self-pay | Admitting: Family Medicine

## 2023-09-02 ENCOUNTER — Other Ambulatory Visit: Payer: Self-pay | Admitting: Family Medicine

## 2023-09-14 ENCOUNTER — Other Ambulatory Visit: Payer: Self-pay | Admitting: Family Medicine

## 2023-10-07 ENCOUNTER — Other Ambulatory Visit: Payer: Self-pay | Admitting: Family Medicine

## 2023-10-12 ENCOUNTER — Other Ambulatory Visit: Payer: Self-pay | Admitting: Family Medicine

## 2023-10-12 DIAGNOSIS — K219 Gastro-esophageal reflux disease without esophagitis: Secondary | ICD-10-CM

## 2023-11-07 ENCOUNTER — Other Ambulatory Visit: Payer: Self-pay | Admitting: Family Medicine

## 2023-11-08 ENCOUNTER — Encounter: Payer: Self-pay | Admitting: Cardiology

## 2023-11-08 ENCOUNTER — Ambulatory Visit: Payer: Medicare Other | Attending: Cardiology | Admitting: Cardiology

## 2023-11-08 ENCOUNTER — Encounter

## 2023-11-08 ENCOUNTER — Other Ambulatory Visit: Payer: Self-pay | Admitting: Family Medicine

## 2023-11-08 ENCOUNTER — Ambulatory Visit

## 2023-11-08 VITALS — BP 120/62 | HR 78 | Resp 16 | Ht 63.0 in | Wt 209.8 lb

## 2023-11-08 DIAGNOSIS — I442 Atrioventricular block, complete: Secondary | ICD-10-CM

## 2023-11-08 DIAGNOSIS — Z950 Presence of cardiac pacemaker: Secondary | ICD-10-CM | POA: Diagnosis not present

## 2023-11-08 DIAGNOSIS — I10 Essential (primary) hypertension: Secondary | ICD-10-CM

## 2023-11-08 NOTE — Patient Instructions (Signed)
 Medication Instructions:  Your physician recommends that you continue on your current medications as directed. Please refer to the Current Medication list given to you today.  *If you need a refill on your cardiac medications before your next appointment, please call your pharmacy*   Lab Work: none If you have labs (blood work) drawn today and your tests are completely normal, you will receive your results only by: MyChart Message (if you have MyChart) OR A paper copy in the mail If you have any lab test that is abnormal or we need to change your treatment, we will call you to review the results.   Testing/Procedures: none   Follow-Up: At [facility], you and your health needs are our priority.  As part of our continuing mission to provide you with exceptional heart care, we have created designated Provider Care Teams.  These Care Teams include your primary Cardiologist (physician) and Advanced Practice Providers (APPs -  Physician Assistants and Nurse Practitioners) who all work together to provide you with the care you need, when you need it.  We recommend signing up for the patient portal called "MyChart".  Sign up information is provided on this After Visit Summary.  MyChart is used to connect with patients for Virtual Visits (Telemedicine).  Patients are able to view lab/test results, encounter notes, upcoming appointments, etc.  Non-urgent messages can be sent to your provider as well.   To learn more about what you can do with MyChart, go to [email or web address].    Your next appointment:   6 month(s)  Provider:   With EP   Other Instructions As needed with Dr Jacinto Halim
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
## 2023-11-09 LAB — CUP PACEART REMOTE DEVICE CHECK
Battery Remaining Longevity: 26 mo
Battery Remaining Percentage: 22 %
Battery Voltage: 2.9 V
Brady Statistic AP VP Percent: 35 %
Brady Statistic AP VS Percent: 1 %
Brady Statistic AS VP Percent: 65 %
Brady Statistic AS VS Percent: 1 %
Brady Statistic RA Percent Paced: 34 %
Brady Statistic RV Percent Paced: 99 %
Date Time Interrogation Session: 20250311033954
Implantable Lead Connection Status: 753985
Implantable Lead Connection Status: 753985
Implantable Lead Implant Date: 20161221
Implantable Lead Implant Date: 20161221
Implantable Lead Location: 753860
Implantable Lead Location: 753860
Implantable Pulse Generator Implant Date: 20161221
Lead Channel Impedance Value: 350 Ohm
Lead Channel Impedance Value: 540 Ohm
Lead Channel Pacing Threshold Amplitude: 0.625 V
Lead Channel Pacing Threshold Amplitude: 0.625 V
Lead Channel Pacing Threshold Pulse Width: 0.5 ms
Lead Channel Pacing Threshold Pulse Width: 0.5 ms
Lead Channel Sensing Intrinsic Amplitude: 1.8 mV
Lead Channel Sensing Intrinsic Amplitude: 11.2 mV
Lead Channel Setting Pacing Amplitude: 0.875
Lead Channel Setting Pacing Amplitude: 1.625
Lead Channel Setting Pacing Pulse Width: 0.5 ms
Lead Channel Setting Sensing Sensitivity: 4 mV
Pulse Gen Model: 2240
Pulse Gen Serial Number: 7839181

## 2023-11-16 DIAGNOSIS — E119 Type 2 diabetes mellitus without complications: Secondary | ICD-10-CM | POA: Diagnosis not present

## 2023-11-16 DIAGNOSIS — H31003 Unspecified chorioretinal scars, bilateral: Secondary | ICD-10-CM | POA: Diagnosis not present

## 2023-11-16 DIAGNOSIS — H5203 Hypermetropia, bilateral: Secondary | ICD-10-CM | POA: Diagnosis not present

## 2023-11-16 DIAGNOSIS — H25813 Combined forms of age-related cataract, bilateral: Secondary | ICD-10-CM | POA: Diagnosis not present

## 2023-11-16 DIAGNOSIS — H43813 Vitreous degeneration, bilateral: Secondary | ICD-10-CM | POA: Diagnosis not present

## 2023-11-16 LAB — HM DIABETES EYE EXAM

## 2023-11-17 ENCOUNTER — Encounter: Payer: Self-pay | Admitting: Family Medicine

## 2023-12-06 ENCOUNTER — Ambulatory Visit: Admitting: Podiatry

## 2023-12-06 ENCOUNTER — Encounter: Payer: Self-pay | Admitting: Podiatry

## 2023-12-06 ENCOUNTER — Encounter: Admitting: Family Medicine

## 2023-12-06 DIAGNOSIS — M79674 Pain in right toe(s): Secondary | ICD-10-CM | POA: Diagnosis not present

## 2023-12-06 DIAGNOSIS — B351 Tinea unguium: Secondary | ICD-10-CM | POA: Diagnosis not present

## 2023-12-06 DIAGNOSIS — M79675 Pain in left toe(s): Secondary | ICD-10-CM

## 2023-12-06 DIAGNOSIS — E1142 Type 2 diabetes mellitus with diabetic polyneuropathy: Secondary | ICD-10-CM | POA: Diagnosis not present

## 2023-12-06 NOTE — Progress Notes (Signed)
 This patient returns to my office for at risk foot care.  This patient requires this care by a professional since this patient will be at risk due to having  Diabetes and PAD.  Patient is taking plavix.  This patient is unable to cut nails herself since the patient cannot reach her nails . These nails are painful walking and wearing shoes.  This patient presents for at risk foot care today.  General Appearance  Alert, conversant and in no acute stress.  Vascular  Dorsalis pedis and posterior tibial  pulses are absent bilaterally.  Capillary return is within normal limits  bilaterally. Cold feet  bilaterally.  Neurologic  Senn-Weinstein monofilament wire absent   bilaterally. Muscle power within normal limits bilaterally.  Nails Thick disfigured discolored nails with subungual debris  from hallux to fifth toes bilaterally. No evidence of bacterial infection or drainage bilaterally.  Orthopedic  No limitations of motion  feet .  No crepitus or effusions noted.  No bony pathology or digital deformities noted.  Skin  normotropic skin with no porokeratosis noted bilaterally.  No signs of infections or ulcers noted.  Skin necrosis medial aspect heels  B/L.   Onychomycosis  Pain in right toes  Pain in left toes   Skin necrosis  B/L.  Consent was obtained for treatment procedures.   Mechanical debridement of nails 1-5  bilaterally performed with a nail nipper.  Filed with dremel without incident. No infection or ulcer.  Dispense heel cushion for sleep to protect skin on her heels.   Return office visit  4  months        Told patient to return for periodic foot care and evaluation due to potential at risk complications.   Helane Gunther DPM

## 2023-12-08 ENCOUNTER — Other Ambulatory Visit: Payer: Self-pay | Admitting: Family Medicine

## 2023-12-08 DIAGNOSIS — E1159 Type 2 diabetes mellitus with other circulatory complications: Secondary | ICD-10-CM

## 2023-12-15 ENCOUNTER — Other Ambulatory Visit: Payer: Self-pay | Admitting: Family Medicine

## 2023-12-16 ENCOUNTER — Other Ambulatory Visit: Payer: Self-pay | Admitting: Family Medicine

## 2023-12-27 NOTE — Addendum Note (Signed)
 Addended by: Lott Rouleau A on: 12/27/2023 09:46 AM   Modules accepted: Orders

## 2023-12-27 NOTE — Progress Notes (Signed)
 Remote pacemaker transmission.

## 2024-01-02 ENCOUNTER — Encounter

## 2024-01-17 ENCOUNTER — Other Ambulatory Visit: Payer: Self-pay | Admitting: Family

## 2024-01-18 ENCOUNTER — Other Ambulatory Visit: Payer: Self-pay | Admitting: Family Medicine

## 2024-01-18 MED ORDER — HYDROCODONE-ACETAMINOPHEN 5-325 MG PO TABS: 1.0000 | ORAL_TABLET | Freq: Two times a day (BID) | ORAL | 0 refills | Status: DC

## 2024-01-18 NOTE — Telephone Encounter (Signed)
 Copied from CRM [id number]. Topic: Clinical - Medication Refill >> Jan 18, 2024 12:18 PM Howard Macho wrote: Medication: HYDROcodone -acetaminophen  (NORCO/VICODIN) 5-325 MG tablet  Has the patient contacted their pharmacy? Yes (Agent: If no, request that the patient contact the pharmacy for the refill. If patient does not wish to contact the pharmacy document the reason why and proceed with request.) (Agent: If yes, when and what did the pharmacy advise?)  This is the patient's preferred pharmacy:  [facility] - [place], [place] - [address] [place] [address] Phone: [phone] Fax: [phone]  Is this the correct pharmacy for this prescription? Yes If no, delete pharmacy and type the correct one.   Has the prescription been filled recently? Yes  Is the patient out of the medication? Yes  Has the patient been seen for an appointment in the last year OR does the patient have an upcoming appointment?   Can we respond through MyChart? Yes  Agent: Please be advised that Rx refills may take up to 3 business days. We ask that you follow-up with your pharmacy.

## 2024-01-18 NOTE — Telephone Encounter (Signed)
 07/25/2023 LOV  12/16/2023 fill date  60/0 refills

## 2024-02-03 ENCOUNTER — Other Ambulatory Visit: Payer: Self-pay | Admitting: Family Medicine

## 2024-02-07 ENCOUNTER — Ambulatory Visit (INDEPENDENT_AMBULATORY_CARE_PROVIDER_SITE_OTHER)

## 2024-02-07 DIAGNOSIS — I442 Atrioventricular block, complete: Secondary | ICD-10-CM

## 2024-02-08 ENCOUNTER — Encounter: Admitting: Family Medicine

## 2024-02-08 LAB — CUP PACEART REMOTE DEVICE CHECK
Battery Remaining Longevity: 24 mo
Battery Remaining Percentage: 19 %
Battery Voltage: 2.9 V
Brady Statistic AP VP Percent: 32 %
Brady Statistic AP VS Percent: 1 %
Brady Statistic AS VP Percent: 68 %
Brady Statistic AS VS Percent: 1 %
Brady Statistic RA Percent Paced: 32 %
Brady Statistic RV Percent Paced: 99 %
Date Time Interrogation Session: 20250610022242
Implantable Lead Connection Status: 753985
Implantable Lead Connection Status: 753985
Implantable Lead Implant Date: 20161221
Implantable Lead Implant Date: 20161221
Implantable Lead Location: 753860
Implantable Lead Location: 753860
Implantable Pulse Generator Implant Date: 20161221
Lead Channel Impedance Value: 390 Ohm
Lead Channel Impedance Value: 590 Ohm
Lead Channel Pacing Threshold Amplitude: 0.75 V
Lead Channel Pacing Threshold Amplitude: 0.75 V
Lead Channel Pacing Threshold Pulse Width: 0.5 ms
Lead Channel Pacing Threshold Pulse Width: 0.5 ms
Lead Channel Sensing Intrinsic Amplitude: 11.2 mV
Lead Channel Sensing Intrinsic Amplitude: 2.1 mV
Lead Channel Setting Pacing Amplitude: 1 V
Lead Channel Setting Pacing Amplitude: 1.75 V
Lead Channel Setting Pacing Pulse Width: 0.5 ms
Lead Channel Setting Sensing Sensitivity: 4 mV
Pulse Gen Model: 2240
Pulse Gen Serial Number: 7839181

## 2024-02-13 ENCOUNTER — Ambulatory Visit: Payer: Self-pay | Admitting: Cardiology

## 2024-02-15 ENCOUNTER — Encounter: Admitting: Family Medicine

## 2024-02-16 ENCOUNTER — Ambulatory Visit: Payer: Self-pay

## 2024-02-16 ENCOUNTER — Other Ambulatory Visit: Payer: Self-pay | Admitting: Family Medicine

## 2024-02-16 NOTE — Telephone Encounter (Signed)
 FYI Only or Action Required?: Action required by provider: clinical question for provider.  Patient was last seen in primary care on 06/06/2023 by Luevenia Saha, MD. Called Nurse Triage reporting Leg Swelling. Symptoms began several weeks ago. Interventions attempted: Prescription medications: Lasix . Symptoms are: unchanged.  Triage Disposition: See Physician Within 24 Hours  Patient/caregiver understands and will follow disposition?: Yes No availability, asking to be worked in.       Copied from CRM [id number]. Topic: Clinical - Red Word Triage >> Feb 16, 2024 12:22 PM Pam Bode wrote: Red Word that prompted transfer to Nurse Triage: Patient daughter Genevia Kern is calling in trying to schedule appointment with her dr, but due to the issues she is having we would need nursing involved, Patient is having swelling going in legs. Reason for Disposition  [1] MODERATE leg swelling (e.g., swelling extends up to knees) AND [2] new-onset or worsening  Answer Assessment - Initial Assessment Questions 1. ONSET: When did the swelling start? (e.g., minutes, hours, days)     On going 2. LOCATION: What part of the leg is swollen?  Are both legs swollen or just one leg?     Both legs 3. SEVERITY: How bad is the swelling? (e.g., localized; mild, moderate, severe)   - Localized: Small area of swelling localized to one leg.   - MILD pedal edema: Swelling limited to foot and ankle, pitting edema < 1/4 inch (6 mm) deep, rest and elevation eliminate most or all swelling.   - MODERATE edema: Swelling of lower leg to knee, pitting edema > 1/4 inch (6 mm) deep, rest and elevation only partially reduce swelling.   - SEVERE edema: Swelling extends above knee, facial or hand swelling present.      Moderate 4. REDNESS: Does the swelling look red or infected?     no 5. PAIN: Is the swelling painful to touch? If Yes, ask: How painful is it?   (Scale 1-10; mild, moderate or severe)     Yes  - moderate   6. FEVER: Do you have a fever? If Yes, ask: What is it, how was it measured, and when did it start?      no 7. CAUSE: What do you think is causing the leg swelling?     unsure 8. MEDICAL HISTORY: Do you have a history of blood clots (e.g., DVT), cancer, heart failure, kidney disease, or liver failure?     Pacemaker 9. RECURRENT SYMPTOM: Have you had leg swelling before? If Yes, ask: When was the last time? What happened that time?     Yes 10. OTHER SYMPTOMS: Do you have any other symptoms? (e.g., chest pain, difficulty breathing)       No 11. PREGNANCY: Is there any chance you are pregnant? When was your last menstrual period?       no  Protocols used: Leg Swelling and Edema-A-AH

## 2024-02-16 NOTE — Telephone Encounter (Signed)
 Called pt and was able to schedule her w/ Alexander Iba on 02/17/24 @ 11:20 am for leg swelling.

## 2024-02-17 ENCOUNTER — Ambulatory Visit

## 2024-02-17 ENCOUNTER — Encounter: Payer: Self-pay | Admitting: Physician Assistant

## 2024-02-17 ENCOUNTER — Ambulatory Visit (HOSPITAL_COMMUNITY)
Admission: RE | Admit: 2024-02-17 | Discharge: 2024-02-17 | Disposition: A | Discharge status: Home / Self Care | Source: Ambulatory Visit | Attending: Physician Assistant | Admitting: Physician Assistant

## 2024-02-17 ENCOUNTER — Ambulatory Visit: Admitting: Physician Assistant

## 2024-02-17 VITALS — BP 130/60 | HR 59 | Temp 98.2°F | Ht 63.0 in | Wt 202.0 lb

## 2024-02-17 DIAGNOSIS — M545 Low back pain, unspecified: Secondary | ICD-10-CM

## 2024-02-17 DIAGNOSIS — N1832 Chronic kidney disease, stage 3b: Secondary | ICD-10-CM | POA: Diagnosis not present

## 2024-02-17 DIAGNOSIS — K219 Gastro-esophageal reflux disease without esophagitis: Secondary | ICD-10-CM | POA: Diagnosis not present

## 2024-02-17 DIAGNOSIS — R29818 Other symptoms and signs involving the nervous system: Secondary | ICD-10-CM | POA: Diagnosis not present

## 2024-02-17 DIAGNOSIS — Z1629 Resistance to other single specified antibiotic: Secondary | ICD-10-CM | POA: Diagnosis not present

## 2024-02-17 DIAGNOSIS — E7800 Pure hypercholesterolemia, unspecified: Secondary | ICD-10-CM | POA: Diagnosis not present

## 2024-02-17 DIAGNOSIS — N189 Chronic kidney disease, unspecified: Secondary | ICD-10-CM | POA: Diagnosis not present

## 2024-02-17 DIAGNOSIS — Z950 Presence of cardiac pacemaker: Secondary | ICD-10-CM | POA: Diagnosis not present

## 2024-02-17 DIAGNOSIS — I129 Hypertensive chronic kidney disease with stage 1 through stage 4 chronic kidney disease, or unspecified chronic kidney disease: Secondary | ICD-10-CM | POA: Diagnosis not present

## 2024-02-17 DIAGNOSIS — E1140 Type 2 diabetes mellitus with diabetic neuropathy, unspecified: Secondary | ICD-10-CM

## 2024-02-17 DIAGNOSIS — E1142 Type 2 diabetes mellitus with diabetic polyneuropathy: Secondary | ICD-10-CM | POA: Diagnosis not present

## 2024-02-17 DIAGNOSIS — E7849 Other hyperlipidemia: Secondary | ICD-10-CM | POA: Diagnosis not present

## 2024-02-17 DIAGNOSIS — N2889 Other specified disorders of kidney and ureter: Secondary | ICD-10-CM | POA: Diagnosis not present

## 2024-02-17 DIAGNOSIS — Z79899 Other long term (current) drug therapy: Secondary | ICD-10-CM | POA: Diagnosis not present

## 2024-02-17 DIAGNOSIS — R1030 Lower abdominal pain, unspecified: Secondary | ICD-10-CM | POA: Diagnosis not present

## 2024-02-17 DIAGNOSIS — G309 Alzheimer's disease, unspecified: Secondary | ICD-10-CM | POA: Diagnosis not present

## 2024-02-17 DIAGNOSIS — E1122 Type 2 diabetes mellitus with diabetic chronic kidney disease: Secondary | ICD-10-CM | POA: Diagnosis not present

## 2024-02-17 DIAGNOSIS — E871 Hypo-osmolality and hyponatremia: Secondary | ICD-10-CM | POA: Diagnosis not present

## 2024-02-17 DIAGNOSIS — Z1611 Resistance to penicillins: Secondary | ICD-10-CM | POA: Diagnosis not present

## 2024-02-17 DIAGNOSIS — R109 Unspecified abdominal pain: Secondary | ICD-10-CM | POA: Diagnosis not present

## 2024-02-17 DIAGNOSIS — K828 Other specified diseases of gallbladder: Secondary | ICD-10-CM | POA: Diagnosis not present

## 2024-02-17 DIAGNOSIS — F0280 Dementia in other diseases classified elsewhere without behavioral disturbance: Secondary | ICD-10-CM | POA: Diagnosis not present

## 2024-02-17 DIAGNOSIS — M109 Gout, unspecified: Secondary | ICD-10-CM | POA: Diagnosis not present

## 2024-02-17 DIAGNOSIS — M7989 Other specified soft tissue disorders: Secondary | ICD-10-CM

## 2024-02-17 DIAGNOSIS — N3090 Cystitis, unspecified without hematuria: Secondary | ICD-10-CM | POA: Diagnosis not present

## 2024-02-17 DIAGNOSIS — E119 Type 2 diabetes mellitus without complications: Secondary | ICD-10-CM | POA: Diagnosis not present

## 2024-02-17 DIAGNOSIS — M1000 Idiopathic gout, unspecified site: Secondary | ICD-10-CM | POA: Diagnosis not present

## 2024-02-17 DIAGNOSIS — N3091 Cystitis, unspecified with hematuria: Secondary | ICD-10-CM | POA: Diagnosis not present

## 2024-02-17 DIAGNOSIS — M47816 Spondylosis without myelopathy or radiculopathy, lumbar region: Secondary | ICD-10-CM | POA: Diagnosis not present

## 2024-02-17 DIAGNOSIS — M48061 Spinal stenosis, lumbar region without neurogenic claudication: Secondary | ICD-10-CM | POA: Diagnosis not present

## 2024-02-17 DIAGNOSIS — N179 Acute kidney failure, unspecified: Secondary | ICD-10-CM | POA: Diagnosis not present

## 2024-02-17 DIAGNOSIS — I10 Essential (primary) hypertension: Secondary | ICD-10-CM | POA: Diagnosis not present

## 2024-02-17 DIAGNOSIS — Z7984 Long term (current) use of oral hypoglycemic drugs: Secondary | ICD-10-CM | POA: Diagnosis not present

## 2024-02-17 DIAGNOSIS — N2882 Megaloureter: Secondary | ICD-10-CM | POA: Diagnosis not present

## 2024-02-17 DIAGNOSIS — E1165 Type 2 diabetes mellitus with hyperglycemia: Secondary | ICD-10-CM | POA: Diagnosis not present

## 2024-02-17 DIAGNOSIS — G8929 Other chronic pain: Secondary | ICD-10-CM

## 2024-02-17 DIAGNOSIS — D509 Iron deficiency anemia, unspecified: Secondary | ICD-10-CM | POA: Diagnosis not present

## 2024-02-17 DIAGNOSIS — R58 Hemorrhage, not elsewhere classified: Secondary | ICD-10-CM | POA: Diagnosis not present

## 2024-02-17 DIAGNOSIS — N3081 Other cystitis with hematuria: Secondary | ICD-10-CM | POA: Diagnosis not present

## 2024-02-17 NOTE — Telephone Encounter (Signed)
 Appt today

## 2024-02-17 NOTE — Progress Notes (Signed)
 Krista Clarke is a 83 y.o. female here for a follow up of a pre-existing problem.  History of Present Illness:   Chief Complaint  Patient presents with   Leg Swelling    Pt c/o bilateral leg edema right worse than left, trouble walking.    HPI  She is with her daughter, who drove her to the appointment today  Lower leg swelling History of this Noticed increase in swelling about 1-2 weeks ago Has bilateral leg swelling but notices symptom(s) are worse in the right She spends most of her day sitting -- does no chores/cooking/etc Reports that her legs are almost always elevated when she is sitting  Denies shortness of breath, chest pain She has ongoing fatigue (chronic) and difficulty walking (increased more recently)  Two nights ago she had an episode where she was unable to get out of her chair - had to have her son help her get out - her back pain was quite severed  She is compliant with medication  She takes pill packs filled by [facility] I reviewed the list of her current medications with content list of her pill packs - she is compliant  Does not weigh self daily but does have access to scale She has had about a 7 pounds weight loss based on our system weights  Of note, she had similar episode to this in November 2024 -- she was seen in our office and then referred to ER as she was having chest tightness and weight gain with that episodes -- her blood work and imaging was negative. Her Lasix  was doubled to 80 mg for a few days and she was referred back to our office. She had acute follow up with another provider and thankfully her fluid status improved. She was instructed to follow up with PCP but has yet to do so over the last 6 months  Past Medical History:  Diagnosis Date   Anemia    takes iron daily   Arthritis    Complete heart block (HCC) 08/20/2015   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     Diabetes mellitus without complication (HCC)     Encounter for care of pacemaker 05/24/2019   Hypercholesteremia    Hypertension    Pacemaker; 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 Rozelle Corning 2010 lumbar MRI, multilevel and severe.     Social History   Tobacco Use   Smoking status: Never   Smokeless tobacco: Never  Vaping Use   Vaping status: Never Used  Substance Use Topics   Alcohol  use: No   Drug use: No    Past Surgical History:  Procedure Laterality Date   ABDOMINAL HYSTERECTOMY     COLONOSCOPY WITH PROPOFOL  N/A 07/03/2013   Procedure: COLONOSCOPY WITH PROPOFOL ;  Surgeon: Garrett Kallman, MD;  Location: [facility] ENDOSCOPY;  Service: Endoscopy;  Laterality: N/A;   EP IMPLANTABLE DEVICE N/A 08/20/2015   Procedure: Pacemaker Implant;  Surgeon: Tammie Fall, MD;  Location: [facility] INVASIVE CV LAB;  Service: Cardiovascular;  Laterality: N/A;   JOINT REPLACEMENT Bilateral    '99. Knees   LEFT HEART CATHETERIZATION WITH CORONARY ANGIOGRAM N/A 08/27/2014   Procedure: LEFT HEART CATHETERIZATION WITH CORONARY ANGIOGRAM;  Surgeon: Jessica Morn, MD;  Location: [facility] CATH LAB;  Service: Cardiovascular;  Laterality: N/A;   TUBAL LIGATION  Family History  Problem Relation Age of Onset   Hypertension Mother    Diabetes Mother    Hypertension Father    Cancer Brother    Breast cancer Granddaughter        30s    Allergies  Allergen Reactions   Prednisone Other (See Comments), Palpitations and Rash    Doesn't work well for patient    Current Medications:   Current Outpatient Medications:    allopurinol  (ZYLOPRIM ) 100 MG tablet, TAKE 1 TABLET BY MOUTH EVERY MORNING, Disp: 90 tablet, Rfl: 3   amLODipine  (NORVASC ) 5 MG tablet, TAKE 1 TABLET BY MOUTH EVERY DAY, Disp: 90 tablet, Rfl: 3   ascorbic acid  (VITAMIN C ) 500 MG tablet, TAKE 1 TABLET BY MOUTH EVERY DAY, Disp: 90 tablet, Rfl: 3   ASPIRIN  LOW DOSE 81 MG  chewable tablet, CHEW ONE TABLET IN MOUTH EVERY DAY, Disp: 90 tablet, Rfl: 3   atorvastatin  (LIPITOR) 40 MG tablet, Take 1 tablet (40 mg total) by mouth at bedtime., Disp: 90 tablet, Rfl: 3   cyanocobalamin  (VITAMIN B12) 1000 MCG tablet, TAKE 1 TABLET BY MOUTH EVERY DAY, Disp: 90 tablet, Rfl: 3   dapagliflozin  propanediol (FARXIGA ) 10 MG TABS tablet, Take 1 tablet (10 mg total) by mouth daily., Disp: 90 tablet, Rfl: 3   diclofenac  Sodium (VOLTAREN ) 1 % GEL, Apply 4 GRAMS topically 4 TIMES daily AS NEEDED FOR PAIN, Disp: 400 g, Rfl: 3   FEROSUL 325 (65 Fe) MG tablet, TAKE 1 TABLET BY MOUTH 2 TIMES DAILY, Disp: 90 tablet, Rfl: 3   furosemide  (LASIX ) 40 MG tablet, TAKE 1 TABLET BY MOUTH EVERY DAY, Disp: 90 tablet, Rfl: 3   HYDROcodone -acetaminophen  (NORCO/VICODIN) 5-325 MG tablet, TAKE 1 TABLET BY MOUTH 2 TIMES DAILY, Disp: 60 tablet, Rfl: 0   losartan  (COZAAR ) 100 MG tablet, TAKE 1 TABLET BY MOUTH EVERY DAY, Disp: 90 tablet, Rfl: 3   Lutein  20 MG CAPS, TAKE 1 CAPSULE BY MOUTH EVERY DAY, Disp: 90 capsule, Rfl: 3   magnesium  oxide (MAG-OX) 400 (240 Mg) MG tablet, TAKE 1 TABLET BY MOUTH ONCE DAILY, Disp: 28 tablet, Rfl: 3   meclizine  (ANTIVERT ) 12.5 MG tablet, Take 1 tablet (12.5 mg total) by mouth 2 (two) times daily as needed for dizziness., Disp: 30 tablet, Rfl: 2   metoprolol  succinate (TOPROL -XL) 25 MG 24 hr tablet, TAKE 1 TABLET BY MOUTH EVERY DAY, Disp: 90 tablet, Rfl: 3   omeprazole  (PRILOSEC) 20 MG capsule, TAKE 1 CAPSULE BY MOUTH EVERY DAY BEFORE BREAKFAST, Disp: 90 capsule, Rfl: 3   triamcinolone  (KENALOG ) 0.1 %, Apply 1 application topically 2 (two) times daily. For 2 weeks, then as needed, Disp: 45 g, Rfl: 0   Review of Systems:   Negative unless otherwise specified per HPI.  Vitals:   Vitals:   02/17/24 1147  BP: 130/60  Pulse: (!) 59  Temp: 98.2 F (36.8 C)  TempSrc: Temporal  SpO2: 95%  Weight: 202 lb (91.6 kg)  Height: 5' 3 (1.6 m)     Body mass index is 35.78  kg/m.  Physical Exam:   Physical Exam Vitals and nursing note reviewed.  Constitutional:      General: She is not in acute distress.    Appearance: She is well-developed. She is obese. She is not ill-appearing or toxic-appearing.   Cardiovascular:     Rate and Rhythm: Normal rate and regular rhythm.     Pulses: Normal pulses.     Heart sounds: Normal heart sounds, S1 normal  and S2 normal.     Comments: Edema slightly worse on right > left  Pulmonary:     Effort: Pulmonary effort is normal.     Breath sounds: Normal breath sounds.   Musculoskeletal:     Right lower leg: 3+ Edema present.     Left lower leg: 3+ Edema present.     Comments: Pain with palpation to bilateral lower legs and feet  Generalized tenderness to palpation to lumbar spine and paraspinal region; no exquisite bony tenderness   Skin:    General: Skin is warm and dry.   Neurological:     Mental Status: She is alert.     GCS: GCS eye subscore is 4. GCS verbal subscore is 5. GCS motor subscore is 6.   Psychiatric:        Speech: Speech normal.        Behavior: Behavior normal. Behavior is cooperative.     Assessment and Plan:   1. Localized swelling of both lower extremities (Primary) - VAS US  LOWER EXTREMITY VENOUS (DVT); Future - CBC with Differential/Platelet - Comprehensive metabolic panel with GFR - TSH - B Nat Peptide - IBC + Ferritin  No red flags on exam warranting ER evaluation at this time Compliant with medications Suspect worsening of dependent edema, however due to pain and slightly worsening of swelling on one leg -- will order vas ultrasound to rule out DVT IF ultrasound negative and blood work stable, will temporarily increase lasix  as this has worked well for her in the past She was instructed to go to the ER if any worsening symptom(s) in the interim She was also recommended to continue to follow closely with her cardiologist and PCP  We are faxing over order for zippered  compression stockings to [facility] for her to see if available - these will undoubtedly help with swelling  2. Chronic midline low back pain, unspecified whether sciatica present - DG Lumbar Spine Complete; Future  Reviewed most recent imaging Will update this today -- she is interested in resuming home health physical therapy if this is possible  Will await imaging read and order if appropriate based on results  3. Type 2 diabetes, controlled, with neuropathy (HCC) - Hemoglobin A1c   Overdue Will recheck this today to assess given ongoing pain/neuropathy to make sure this is not contributing to symptom(s) Recommend close follow up with PCP for ongoing management   Alexander Iba, PA-C  I spent a total of 45 minutes on this visit, today 02/17/24, which included reviewing previous notes from primary care, ER, cardiology, ordering tests, discussing plan of care with patient and using shared-decision making on next steps, and documenting the findings in the note.

## 2024-02-17 NOTE — Patient Instructions (Signed)
 It was great to see you!  Please schedule a close follow up with Dr. Jonelle Neri to check on your chronic medical issues within 1-3 weeks  I'm going try to send compression stockings into [facility] for you -- we will let you know if/when we do this  We are scheduling an ultrasound of your legs to check for blood clot  I will be in touch with all results and the plan  Call us :  Anytime you have any of the following symptoms:  1) 3 pound weight gain in 24 hours or 5 pounds in 1 week  2) shortness of breath, with or without a dry hacking cough  3) swelling in the hands, feet or stomach  4) if you have to sleep on extra pillows at night in order to breathe.   If any chest pain, shortness of breath, or severe pain, go to the ER  Take care,  Onofrio Klemp PA-C

## 2024-02-17 NOTE — Addendum Note (Signed)
 Addended by: Winona Haw on: 02/17/2024 02:46 PM   Modules accepted: Orders

## 2024-02-18 ENCOUNTER — Emergency Department (HOSPITAL_COMMUNITY)

## 2024-02-18 ENCOUNTER — Encounter: Payer: Self-pay | Admitting: Physician Assistant

## 2024-02-18 ENCOUNTER — Other Ambulatory Visit: Payer: Self-pay

## 2024-02-18 ENCOUNTER — Inpatient Hospital Stay (HOSPITAL_COMMUNITY)
Admission: EM | Admit: 2024-02-18 | Discharge: 2024-02-22 | DRG: 690 | Disposition: A | Discharge status: Home Health | Attending: Internal Medicine | Admitting: Internal Medicine

## 2024-02-18 DIAGNOSIS — I10 Essential (primary) hypertension: Secondary | ICD-10-CM | POA: Diagnosis present

## 2024-02-18 DIAGNOSIS — E1121 Type 2 diabetes mellitus with diabetic nephropathy: Secondary | ICD-10-CM | POA: Diagnosis present

## 2024-02-18 DIAGNOSIS — Z1611 Resistance to penicillins: Secondary | ICD-10-CM | POA: Diagnosis present

## 2024-02-18 DIAGNOSIS — E119 Type 2 diabetes mellitus without complications: Secondary | ICD-10-CM

## 2024-02-18 DIAGNOSIS — N189 Chronic kidney disease, unspecified: Secondary | ICD-10-CM

## 2024-02-18 DIAGNOSIS — D509 Iron deficiency anemia, unspecified: Secondary | ICD-10-CM | POA: Diagnosis present

## 2024-02-18 DIAGNOSIS — F411 Generalized anxiety disorder: Secondary | ICD-10-CM | POA: Diagnosis present

## 2024-02-18 DIAGNOSIS — F39 Unspecified mood [affective] disorder: Secondary | ICD-10-CM | POA: Insufficient documentation

## 2024-02-18 DIAGNOSIS — G309 Alzheimer's disease, unspecified: Secondary | ICD-10-CM | POA: Diagnosis present

## 2024-02-18 DIAGNOSIS — E7800 Pure hypercholesterolemia, unspecified: Secondary | ICD-10-CM | POA: Diagnosis present

## 2024-02-18 DIAGNOSIS — Z602 Problems related to living alone: Secondary | ICD-10-CM | POA: Diagnosis present

## 2024-02-18 DIAGNOSIS — Z79899 Other long term (current) drug therapy: Secondary | ICD-10-CM

## 2024-02-18 DIAGNOSIS — N3081 Other cystitis with hematuria: Principal | ICD-10-CM | POA: Diagnosis present

## 2024-02-18 DIAGNOSIS — B9620 Unspecified Escherichia coli [E. coli] as the cause of diseases classified elsewhere: Secondary | ICD-10-CM | POA: Diagnosis present

## 2024-02-18 DIAGNOSIS — N1832 Chronic kidney disease, stage 3b: Secondary | ICD-10-CM | POA: Diagnosis present

## 2024-02-18 DIAGNOSIS — M109 Gout, unspecified: Secondary | ICD-10-CM | POA: Diagnosis present

## 2024-02-18 DIAGNOSIS — K219 Gastro-esophageal reflux disease without esophagitis: Secondary | ICD-10-CM | POA: Diagnosis present

## 2024-02-18 DIAGNOSIS — E66812 Obesity, class 2: Secondary | ICD-10-CM | POA: Diagnosis present

## 2024-02-18 DIAGNOSIS — E871 Hypo-osmolality and hyponatremia: Secondary | ICD-10-CM | POA: Diagnosis present

## 2024-02-18 DIAGNOSIS — I129 Hypertensive chronic kidney disease with stage 1 through stage 4 chronic kidney disease, or unspecified chronic kidney disease: Secondary | ICD-10-CM | POA: Diagnosis present

## 2024-02-18 DIAGNOSIS — Z1629 Resistance to other single specified antibiotic: Secondary | ICD-10-CM | POA: Diagnosis present

## 2024-02-18 DIAGNOSIS — N3090 Cystitis, unspecified without hematuria: Secondary | ICD-10-CM | POA: Diagnosis not present

## 2024-02-18 DIAGNOSIS — Z950 Presence of cardiac pacemaker: Secondary | ICD-10-CM

## 2024-02-18 DIAGNOSIS — N3091 Cystitis, unspecified with hematuria: Principal | ICD-10-CM

## 2024-02-18 DIAGNOSIS — E1122 Type 2 diabetes mellitus with diabetic chronic kidney disease: Secondary | ICD-10-CM | POA: Diagnosis present

## 2024-02-18 DIAGNOSIS — R1030 Lower abdominal pain, unspecified: Secondary | ICD-10-CM | POA: Diagnosis not present

## 2024-02-18 DIAGNOSIS — R109 Unspecified abdominal pain: Secondary | ICD-10-CM | POA: Diagnosis not present

## 2024-02-18 DIAGNOSIS — E1142 Type 2 diabetes mellitus with diabetic polyneuropathy: Secondary | ICD-10-CM | POA: Diagnosis present

## 2024-02-18 DIAGNOSIS — N179 Acute kidney failure, unspecified: Secondary | ICD-10-CM

## 2024-02-18 DIAGNOSIS — G629 Polyneuropathy, unspecified: Secondary | ICD-10-CM

## 2024-02-18 DIAGNOSIS — Z8249 Family history of ischemic heart disease and other diseases of the circulatory system: Secondary | ICD-10-CM

## 2024-02-18 DIAGNOSIS — F0284 Dementia in other diseases classified elsewhere, unspecified severity, with anxiety: Secondary | ICD-10-CM | POA: Diagnosis present

## 2024-02-18 DIAGNOSIS — Z7982 Long term (current) use of aspirin: Secondary | ICD-10-CM

## 2024-02-18 DIAGNOSIS — Z96653 Presence of artificial knee joint, bilateral: Secondary | ICD-10-CM | POA: Diagnosis present

## 2024-02-18 DIAGNOSIS — E1165 Type 2 diabetes mellitus with hyperglycemia: Secondary | ICD-10-CM | POA: Diagnosis present

## 2024-02-18 DIAGNOSIS — Z90710 Acquired absence of both cervix and uterus: Secondary | ICD-10-CM

## 2024-02-18 DIAGNOSIS — F0283 Dementia in other diseases classified elsewhere, unspecified severity, with mood disturbance: Secondary | ICD-10-CM | POA: Diagnosis present

## 2024-02-18 DIAGNOSIS — R58 Hemorrhage, not elsewhere classified: Secondary | ICD-10-CM | POA: Diagnosis not present

## 2024-02-18 DIAGNOSIS — E785 Hyperlipidemia, unspecified: Secondary | ICD-10-CM | POA: Diagnosis present

## 2024-02-18 DIAGNOSIS — Z6836 Body mass index (BMI) 36.0-36.9, adult: Secondary | ICD-10-CM

## 2024-02-18 DIAGNOSIS — Z7984 Long term (current) use of oral hypoglycemic drugs: Secondary | ICD-10-CM

## 2024-02-18 DIAGNOSIS — F0280 Dementia in other diseases classified elsewhere without behavioral disturbance: Secondary | ICD-10-CM | POA: Insufficient documentation

## 2024-02-18 MED ORDER — ONDANSETRON HCL 4 MG/2ML IJ SOLN: 4.0000 mg | Freq: Once | INTRAMUSCULAR | Status: DC

## 2024-02-18 NOTE — ED Provider Notes (Incomplete)
 [place] EMERGENCY DEPARTMENT AT [facility] Provider Note   CSN: [id number] Arrival date & time: 02/18/24  2203     Patient presents with: Vaginal Bleeding and Abdominal Pain   Krista Clarke is a 83 y.o. female with medical history as hypertension, anemia, chronic midline low back pain, paresthesias of both lower extremities, Alzheimer's dementia, urinary incontinence.  Patient presents to ED for evaluation of dysuria, lower abdominal pain and vaginal bleeding.  Patient daughter at bedside provides majority of the information.  Patient daughter reports that today around 76 or 1 PM patient began complaining of abdominal pain, dysuria.  Patient denies history of UTIs, dysuria and the patient.  Patient daughter reports that about 2 or 3 hours later, she checked her mother's brief and noted that it had blood clots, bloody urine soaked into the diaper.  The patient began complaining of lower abdominal pain around this time.  She endorses 1 episode of nausea without vomiting.  Denies diarrhea or blood in stool.  But denies blood thinners.  Denies history of vaginal bleeding.  Patient denies any flank pain, fevers.  Patient currently alert and oriented to baseline.    Vaginal Bleeding Associated symptoms: abdominal pain   Abdominal Pain Associated symptoms: vaginal bleeding        Prior to Admission medications   Medication Sig Start Date End Date Taking? Authorizing Provider  allopurinol  (ZYLOPRIM ) 100 MG tablet TAKE 1 TABLET BY MOUTH EVERY MORNING 09/15/23   Jodie Lavern LITTIE, MD  amLODipine  (NORVASC ) 5 MG tablet TAKE 1 TABLET BY MOUTH EVERY DAY 12/08/23   Jodie Lavern LITTIE, MD  ascorbic acid  (VITAMIN C ) 500 MG tablet TAKE 1 TABLET BY MOUTH EVERY DAY 11/08/23   Jodie Lavern LITTIE, MD  ASPIRIN  LOW DOSE 81 MG chewable tablet CHEW ONE TABLET IN MOUTH EVERY DAY 02/03/24   Jodie Lavern LITTIE, MD  atorvastatin  (LIPITOR) 40 MG tablet Take 1 tablet (40 mg total) by mouth at bedtime. 06/06/23   Jodie Lavern LITTIE, MD  cyanocobalamin  (VITAMIN B12) 1000 MCG tablet TAKE 1 TABLET BY MOUTH EVERY DAY 12/08/23   Jodie Lavern LITTIE, MD  dapagliflozin  propanediol (FARXIGA ) 10 MG TABS tablet Take 1 tablet (10 mg total) by mouth daily. 02/07/23   Jodie Lavern LITTIE, MD  diclofenac  Sodium (VOLTAREN ) 1 % GEL Apply 4 GRAMS topically 4 TIMES daily AS NEEDED FOR PAIN 05/24/23   Jodie Lavern LITTIE, MD  FEROSUL 325 (65 Fe) MG tablet TAKE 1 TABLET BY MOUTH 2 TIMES DAILY 02/03/24   Jodie Lavern LITTIE, MD  furosemide  (LASIX ) 40 MG tablet TAKE 1 TABLET BY MOUTH EVERY DAY 04/19/23   Webb, Padonda B, FNP  HYDROcodone -acetaminophen  (NORCO/VICODIN) 5-325 MG tablet TAKE 1 TABLET BY MOUTH 2 TIMES DAILY 02/16/24   Jodie Lavern LITTIE, MD  losartan  (COZAAR ) 100 MG tablet TAKE 1 TABLET BY MOUTH EVERY DAY 09/15/23   Jodie Lavern LITTIE, MD  Lutein  20 MG CAPS TAKE 1 CAPSULE BY MOUTH EVERY DAY 11/08/23   Jodie Lavern LITTIE, MD  magnesium  oxide (MAG-OX) 400 (240 Mg) MG tablet TAKE 1 TABLET BY MOUTH ONCE DAILY 12/08/23   Jodie Lavern LITTIE, MD  meclizine  (ANTIVERT ) 12.5 MG tablet Take 1 tablet (12.5 mg total) by mouth 2 (two) times daily as needed for dizziness. 08/26/20   Jodie Lavern LITTIE, MD  metoprolol  succinate (TOPROL -XL) 25 MG 24 hr tablet TAKE 1 TABLET BY MOUTH EVERY DAY 12/08/23   Jodie Lavern LITTIE, MD  omeprazole  (PRILOSEC) 20 MG capsule TAKE  1 CAPSULE BY MOUTH EVERY DAY BEFORE BREAKFAST 10/12/23   [name], MD  triamcinolone  (KENALOG ) 0.1 % Apply 1 application topically 2 (two) times daily. For 2 weeks, then as needed 10/17/20   [name], MD    Allergies: Prednisone    Review of Systems  Gastrointestinal:  Positive for abdominal pain.  Genitourinary:  Positive for vaginal bleeding.    Updated Vital Signs BP (!) 155/80   Pulse 85   Temp 98.7 F (37.1 C) (Oral)   Resp 17   LMP  (LMP Unknown)   SpO2 100%   Physical Exam  (all labs ordered are listed, but only abnormal results are displayed) Labs Reviewed  CBC WITH DIFFERENTIAL/PLATELET   COMPREHENSIVE METABOLIC PANEL WITH GFR  LIPASE, BLOOD  URINALYSIS, ROUTINE W REFLEX MICROSCOPIC    EKG: None  Radiology: VAS US  LOWER EXTREMITY VENOUS (DVT) Result Date: 02/17/2024  Lower Venous DVT Study Patient Name:  Krista Clarke  Date of Exam:   02/17/2024 Medical Rec #: [id number]    Accession #:    [id number] Date of Birth: Dec 23, 1940     Patient Gender: F Patient Age:   24 years Exam Location:  [address] Procedure:      VAS US  LOWER EXTREMITY VENOUS (DVT) Referring Phys: LUCIE BUTTNER --------------------------------------------------------------------------------  Indications: Edema, and Pain. Other Indications: Right > left, for 1-2 weeks. Risk Factors: Obesity. Anticoagulation: 81 mg ASA. Comparison Study: 04/04/20 bilateral lower extremity negative for DVT. Performing Technologist: [employer] BS, RVT, RDCS  Examination Guidelines: A complete evaluation includes B-mode imaging, spectral Doppler, color Doppler, and power Doppler as needed of all accessible portions of each vessel. Bilateral testing is considered an integral part of a complete examination. Limited examinations for reoccurring indications may be performed as noted. The reflux portion of the exam is performed with the patient in reverse Trendelenburg.  +---------+---------------+---------+-----------+----------+--------------+ RIGHT    CompressibilityPhasicitySpontaneityPropertiesThrombus Aging +---------+---------------+---------+-----------+----------+--------------+ CFV      Full           Yes      Yes                                 +---------+---------------+---------+-----------+----------+--------------+ SFJ      Full           Yes      Yes                                 +---------+---------------+---------+-----------+----------+--------------+ FV Prox  Full           Yes      Yes                                 +---------+---------------+---------+-----------+----------+--------------+ FV  Mid   Full           Yes      Yes                                 +---------+---------------+---------+-----------+----------+--------------+ FV DistalFull           Yes      Yes                                 +---------+---------------+---------+-----------+----------+--------------+  PFV      Full           Yes      Yes                                 +---------+---------------+---------+-----------+----------+--------------+ POP      Full           Yes      Yes                                 +---------+---------------+---------+-----------+----------+--------------+ PTV      Full           Yes      Yes                                 +---------+---------------+---------+-----------+----------+--------------+ PERO     Full           Yes      Yes                                 +---------+---------------+---------+-----------+----------+--------------+ GSV      Full           Yes      Yes                                 +---------+---------------+---------+-----------+----------+--------------+   +---------+---------------+---------+-----------+----------+--------------+ LEFT     CompressibilityPhasicitySpontaneityPropertiesThrombus Aging +---------+---------------+---------+-----------+----------+--------------+ CFV      Full           Yes      Yes                                 +---------+---------------+---------+-----------+----------+--------------+ SFJ      Full           Yes      Yes                                 +---------+---------------+---------+-----------+----------+--------------+ FV Prox  Full           Yes      Yes                                 +---------+---------------+---------+-----------+----------+--------------+ FV Mid   Full           Yes      Yes                                 +---------+---------------+---------+-----------+----------+--------------+ FV DistalFull           Yes      Yes                                  +---------+---------------+---------+-----------+----------+--------------+ PFV      Full           Yes      Yes                                 +---------+---------------+---------+-----------+----------+--------------+  POP      Full           Yes      Yes                                 +---------+---------------+---------+-----------+----------+--------------+ PTV      Full           Yes      Yes                                 +---------+---------------+---------+-----------+----------+--------------+ PERO     Full           Yes      Yes                                 +---------+---------------+---------+-----------+----------+--------------+ GSV      Full           Yes      Yes                                 +---------+---------------+---------+-----------+----------+--------------+    Findings reported to Routed through Epic due to incorrect phone number. at 3:30 PM.  Summary: BILATERAL: - No evidence of deep vein thrombosis seen in the lower extremities, bilaterally. - No evidence of superficial venous thrombosis in the lower extremities, bilaterally. -No evidence of popliteal cyst, bilaterally.   *See table(s) above for measurements and observations. Electronically signed by Norman Serve on 02/17/2024 at 4:42:27 PM.    Final     {Document cardiac monitor, telemetry assessment procedure when appropriate:32947} Procedures   Medications Ordered in the ED - No data to display    {Click here for ABCD2, HEART and other calculators REFRESH Note before signing:1}                              Medical Decision Making Amount and/or Complexity of Data Reviewed Labs: ordered. Radiology: ordered.   ***  {Document critical care time when appropriate  Document review of labs and clinical decision tools ie CHADS2VASC2, etc  Document your independent review of radiology images and any outside records  Document your discussion with family  members, caretakers and with consultants  Document social determinants of health affecting pt's care  Document your decision making why or why not admission, treatments were needed:32947:::1}   Final diagnoses:  None    ED Discharge Orders     None

## 2024-02-18 NOTE — ED Triage Notes (Addendum)
 Pt bib [facility] from home with complaints of vaginal bleeding, generalized lower abdominal pain, and dysuria that all started tonight. Pt does have dementia so daughter is at bedside to provide triage information. Daughter states that she had one brief with blood in it and noticed blood clots in the toilet after using the bathroom. Pt has had nausea but denies vomiting or diarrhea.

## 2024-02-18 NOTE — ED Provider Notes (Signed)
 [facility]ERGENC[facility] DEPARTMENT AT [facility] Provider Note   CSN: [id number] Arrival date & time: 02/18/24  2203     Patient presents with: Vaginal Bleeding and Abdominal Pain   Krista Clarke is a 83 y.o. female with medical history as hypertension, anemia, chronic midline low back pain, paresthesias of both lower extremities, Alzheimer's dementia, urinary incontinence.  Patient presents to ED for evaluation of dysuria, lower abdominal pain and vaginal bleeding.  Patient daughter at bedside provides majority of the information.  Patient daughter reports that today around 8 or 1 PM patient began complaining of abdominal pain, dysuria.  Patient denies history of UTIs, dysuria and the patient.  Patient daughter reports that about 2 or 3 hours later, she checked her mother's brief and noted that it had blood clots, bloody urine soaked into the diaper.  The patient began complaining of lower abdominal pain around this time.  She endorses 1 episode of nausea without vomiting.  Denies diarrhea or blood in stool.  But denies blood thinners.  Denies history of vaginal bleeding.  Patient denies any flank pain, fevers.  Patient currently alert and oriented to baseline. Patient has history of abdominal hysterectomy however unsure if complete or partial.     Vaginal Bleeding Associated symptoms: abdominal pain and dysuria   Abdominal Pain Associated symptoms: dysuria and vaginal bleeding        Prior to Admission medications   Medication Sig Start Date End Date Taking? Authorizing Provider  allopurinol  (ZYLOPRIM ) 100 MG tablet TAKE 1 TABLET BY MOUTH EVERY MORNING 09/15/23   Jodie Lavern LITTIE, MD  amLODipine  (NORVASC ) 5 MG tablet TAKE 1 TABLET BY MOUTH EVERY DAY 12/08/23   Jodie Lavern LITTIE, MD  ascorbic acid  (VITAMIN C ) 500 MG tablet TAKE 1 TABLET BY MOUTH EVERY DAY 11/08/23   Jodie Lavern LITTIE, MD  ASPIRIN  LOW DOSE 81 MG chewable tablet CHEW ONE TABLET IN MOUTH EVERY DAY 02/03/24   Jodie Lavern LITTIE,  MD  atorvastatin  (LIPITOR) 40 MG tablet Take 1 tablet (40 mg total) by mouth at bedtime. 06/06/23   Jodie Lavern LITTIE, MD  cyanocobalamin  (VITAMIN B12) 1000 MCG tablet TAKE 1 TABLET BY MOUTH EVERY DAY 12/08/23   Jodie Lavern LITTIE, MD  dapagliflozin  propanediol (FARXIGA ) 10 MG TABS tablet Take 1 tablet (10 mg total) by mouth daily. 02/07/23   Andy, Camille L, MD  diclofenac  Sodium (VOLTAREN ) 1 % GEL Apply 4 GRAMS topically 4 TIMES daily AS NEEDED FOR PAIN 05/24/23   Jodie Lavern LITTIE, MD  FEROSUL 325 (65 Fe) MG tablet TAKE 1 TABLET BY MOUTH 2 TIMES DAILY 02/03/24   Jodie Lavern LITTIE, MD  furosemide  (LASIX ) 40 MG tablet TAKE 1 TABLET BY MOUTH EVERY DAY 04/19/23   Webb, Padonda B, FNP  HYDROcodone -acetaminophen  (NORCO/VICODIN) 5-325 MG tablet TAKE 1 TABLET BY MOUTH 2 TIMES DAILY 02/16/24   Jodie Lavern LITTIE, MD  losartan  (COZAAR ) 100 MG tablet TAKE 1 TABLET BY MOUTH EVERY DAY 09/15/23   Jodie Lavern LITTIE, MD  Lutein  20 MG CAPS TAKE 1 CAPSULE BY MOUTH EVERY DAY 11/08/23   Jodie Lavern LITTIE, MD  magnesium  oxide (MAG-OX) 400 (240 Mg) MG tablet TAKE 1 TABLET BY MOUTH ONCE DAILY 12/08/23   Jodie Lavern LITTIE, MD  meclizine  (ANTIVERT ) 12.5 MG tablet Take 1 tablet (12.5 mg total) by mouth 2 (two) times daily as needed for dizziness. 08/26/20   Jodie Lavern LITTIE, MD  metoprolol  succinate (TOPROL -XL) 25 MG 24 hr tablet TAKE 1 TABLET BY  MOUTH EVERY DAY 12/08/23   [name], MD  omeprazole  (PRILOSEC) 20 MG capsule TAKE 1 CAPSULE BY MOUTH EVERY DAY BEFORE BREAKFAST 10/12/23   [name], MD  triamcinolone  (KENALOG ) 0.1 % Apply 1 application topically 2 (two) times daily. For 2 weeks, then as needed 10/17/20   [name], MD    Allergies: Prednisone    Review of Systems  Gastrointestinal:  Positive for abdominal pain.  Genitourinary:  Positive for dysuria and vaginal bleeding.  All other systems reviewed and are negative.   Updated Vital Signs BP (!) 92/32   Pulse 83   Temp 98.8 F (37.1 C) (Oral)   Resp 19   LMP   (LMP Unknown)   SpO2 100%   Physical Exam Vitals and nursing note reviewed.  Constitutional:      General: She is not in acute distress.    Appearance: She is well-developed.  HENT:     Head: Normocephalic and atraumatic.   Eyes:     Conjunctiva/sclera: Conjunctivae normal.    Cardiovascular:     Rate and Rhythm: Normal rate and regular rhythm.     Heart sounds: No murmur heard. Pulmonary:     Effort: Pulmonary effort is normal. No respiratory distress.     Breath sounds: Normal breath sounds.  Abdominal:     Palpations: Abdomen is soft.     Tenderness: There is no abdominal tenderness.     Comments: No tenderness to abdomen noted   Musculoskeletal:        General: No swelling.     Cervical back: Neck supple.   Skin:    General: Skin is warm and dry.     Capillary Refill: Capillary refill takes less than 2 seconds.   Neurological:     Mental Status: She is alert. Mental status is at baseline.   Psychiatric:        Mood and Affect: Mood normal.     (all labs ordered are listed, but only abnormal results are displayed) Labs Reviewed  CBC WITH DIFFERENTIAL/PLATELET - Abnormal; Notable for the following components:      Result Value   WBC 15.8 (*)    Hemoglobin 10.7 (*)    HCT 34.5 (*)    MCH 25.0 (*)    RDW 15.9 (*)    Neutro Abs 13.8 (*)    Monocytes Absolute 1.1 (*)    Abs Immature Granulocytes 0.12 (*)    All other components within normal limits  COMPREHENSIVE METABOLIC PANEL WITH GFR - Abnormal; Notable for the following components:   Sodium 132 (*)    Chloride 95 (*)    Glucose, Bld 208 (*)    BUN 34 (*)    Creatinine, Ser 1.85 (*)    Calcium  8.8 (*)    Total Bilirubin 1.6 (*)    GFR, Estimated 27 (*)    All other components within normal limits  URINALYSIS, ROUTINE W REFLEX MICROSCOPIC - Abnormal; Notable for the following components:   Color, Urine RED (*)    APPearance TURBID (*)    Glucose, UA   (*)    Value: TEST NOT REPORTED DUE TO COLOR  INTERFERENCE OF URINE PIGMENT   Hgb urine dipstick   (*)    Value: TEST NOT REPORTED DUE TO COLOR INTERFERENCE OF URINE PIGMENT   Bilirubin Urine   (*)    Value: TEST NOT REPORTED DUE TO COLOR INTERFERENCE OF URINE PIGMENT   Ketones, ur   (*)  Value: TEST NOT REPORTED DUE TO COLOR INTERFERENCE OF URINE PIGMENT   Protein, ur   (*)    Value: TEST NOT REPORTED DUE TO COLOR INTERFERENCE OF URINE PIGMENT   Nitrite   (*)    Value: TEST NOT REPORTED DUE TO COLOR INTERFERENCE OF URINE PIGMENT   Leukocytes,Ua   (*)    Value: TEST NOT REPORTED DUE TO COLOR INTERFERENCE OF URINE PIGMENT   Bacteria, UA MANY (*)    All other components within normal limits  CBG MONITORING, ED - Abnormal; Notable for the following components:   Glucose-Capillary 175 (*)    All other components within normal limits  URINE CULTURE  LIPASE, BLOOD  CBC  BASIC METABOLIC PANEL WITH GFR    EKG: EKG Interpretation Date/Time:  Sunday February 19 2024 02:55:16 EDT Ventricular Rate:  86 PR Interval:  189 QRS Duration:  152 QT Interval:  412 QTC Calculation: 493 R Axis:   -63  Text Interpretation: Sinus rhythm Left bundle branch block Confirmed by Palumbo, April ([id number]) on 02/19/2024 2:57:30 AM  Radiology: [facility] Lumbar Spine Complete Result Date: 02/19/2024 CLINICAL DATA:  Worsening low back pain EXAM: LUMBAR SPINE - COMPLETE 4+ VIEW COMPARISON:  None Available. FINDINGS: Five lumbar type vertebral bodies are well visualized. Vertebral body height is well maintained. Osteophytic changes and disc space narrowing is noted. No pars defects are seen. Facet hypertrophic changes are noted. No soft tissue abnormality is seen. IMPRESSION: Multilevel degenerative change without acute abnormality. Electronically Signed   By: Oneil Devonshire M.D.   On: 02/19/2024 02:38   CT ABDOMEN PELVIS WO CONTRAST Result Date: 02/19/2024 CLINICAL DATA:  Vaginal bleeding, possible hematuria EXAM: CT ABDOMEN AND PELVIS WITHOUT CONTRAST TECHNIQUE:  Multidetector CT imaging of the abdomen and pelvis was performed following the standard protocol without IV contrast. RADIATION DOSE REDUCTION: This exam was performed according to the departmental dose-optimization program which includes automated exposure control, adjustment of the mA and/or kV according to patient size and/or use of iterative reconstruction technique. COMPARISON:  Ultrasound from earlier in the same day. FINDINGS: Lower chest: No acute abnormality. Hepatobiliary: Gallbladder is well distended with dependent density which may be related to tiny stones or gallbladder sludge. Liver is within normal limits. Pancreas: Unremarkable. No pancreatic ductal dilatation or surrounding inflammatory changes. Spleen: Normal in size without focal abnormality. Adrenals/Urinary Tract: Adrenal glands are within normal limits. Right kidney shows no renal calculi or obstructive change. The left kidney demonstrates dilatation of the collecting system with a prominent extrarenal pelvis. The ureter is dilated to the level of the ureterovesical junction although no discrete stone is seen. The bladder is partially distended but demonstrates significant emphysematous changes in the wall consistent with emphysematous cystitis. Some air has dissected along the bladder wall into the adjacent tissues. Stomach/Bowel: The appendix is not well visualized. No inflammatory changes to suggest appendicitis are noted. Stomach and small bowel are unremarkable. Colon shows no significant inflammatory changes. Vascular/Lymphatic: Aortic atherosclerosis. No enlarged abdominal or pelvic lymph nodes. Reproductive: Status post hysterectomy. No adnexal masses. Other: No abdominal wall hernia or abnormality. No abdominopelvic ascites. Musculoskeletal: No acute or significant osseous findings. IMPRESSION: Changes consistent with severe emphysematous cystitis. Some dissection of air into the soft tissue surrounding the bladder is noted as well.  Dilatation of the left collecting system and left ureter to the level of the ureterovesical junction without evidence of obstructing lesion. This is likely related to the underlying changes in the bladder wall. Dependent density in the gallbladder  as described. Electronically Signed   By: Oneil Devonshire M.D.   On: 02/19/2024 02:36   US  Pelvis Complete Result Date: 02/19/2024 CLINICAL DATA:  Postmenopausal vaginal bleeding EXAM: TRANSABDOMINAL AND TRANSVAGINAL ULTRASOUND OF PELVIS TECHNIQUE: Both transabdominal and transvaginal ultrasound examinations of the pelvis were performed. Transabdominal technique was performed for global imaging of the pelvis including uterus, ovaries, adnexal regions, and pelvic cul-de-sac. It was necessary to proceed with endovaginal exam following the transabdominal exam to visualize the ovaries. COMPARISON:  None Available. FINDINGS: Uterus Surgically removed Right ovary Not well visualized Left ovary Not well visualized Other findings No abnormal free fluid. IMPRESSION: Status post hysterectomy. Nonvisualization of the ovaries. Electronically Signed   By: Oneil Devonshire M.D.   On: 02/19/2024 01:01   US  Transvaginal Non-OB Result Date: 02/19/2024 CLINICAL DATA:  Postmenopausal vaginal bleeding EXAM: TRANSABDOMINAL AND TRANSVAGINAL ULTRASOUND OF PELVIS TECHNIQUE: Both transabdominal and transvaginal ultrasound examinations of the pelvis were performed. Transabdominal technique was performed for global imaging of the pelvis including uterus, ovaries, adnexal regions, and pelvic cul-de-sac. It was necessary to proceed with endovaginal exam following the transabdominal exam to visualize the ovaries. COMPARISON:  None Available. FINDINGS: Uterus Surgically removed Right ovary Not well visualized Left ovary Not well visualized Other findings No abnormal free fluid. IMPRESSION: Status post hysterectomy. Nonvisualization of the ovaries. Electronically Signed   By: Oneil Devonshire M.D.   On:  02/19/2024 01:01   VAS US  LOWER EXTREMITY VENOUS (DVT) Result Date: 02/17/2024  Lower Venous DVT Study Patient Name:  MALAJAH OCEGUERA  Date of Exam:   02/17/2024 Medical Rec #: [id number]    Accession #:    [id number] Date of Birth: 09/18/1940     Patient Gender: F Patient Age:   40 years Exam Location:  [address] Procedure:      VAS US  LOWER EXTREMITY VENOUS (DVT) Referring Phys: LUCIE BUTTNER --------------------------------------------------------------------------------  Indications: Edema, and Pain. Other Indications: Right > left, for 1-2 weeks. Risk Factors: Obesity. Anticoagulation: 81 mg ASA. Comparison Study: 04/04/20 bilateral lower extremity negative for DVT. Performing Technologist: [employer] BS, RVT, RDCS  Examination Guidelines: A complete evaluation includes B-mode imaging, spectral Doppler, color Doppler, and power Doppler as needed of all accessible portions of each vessel. Bilateral testing is considered an integral part of a complete examination. Limited examinations for reoccurring indications may be performed as noted. The reflux portion of the exam is performed with the patient in reverse Trendelenburg.  +---------+---------------+---------+-----------+----------+--------------+ RIGHT    CompressibilityPhasicitySpontaneityPropertiesThrombus Aging +---------+---------------+---------+-----------+----------+--------------+ CFV      Full           Yes      Yes                                 +---------+---------------+---------+-----------+----------+--------------+ SFJ      Full           Yes      Yes                                 +---------+---------------+---------+-----------+----------+--------------+ FV Prox  Full           Yes      Yes                                 +---------+---------------+---------+-----------+----------+--------------+ FV Mid  Full           Yes      Yes                                  +---------+---------------+---------+-----------+----------+--------------+ FV DistalFull           Yes      Yes                                 +---------+---------------+---------+-----------+----------+--------------+ PFV      Full           Yes      Yes                                 +---------+---------------+---------+-----------+----------+--------------+ POP      Full           Yes      Yes                                 +---------+---------------+---------+-----------+----------+--------------+ PTV      Full           Yes      Yes                                 +---------+---------------+---------+-----------+----------+--------------+ PERO     Full           Yes      Yes                                 +---------+---------------+---------+-----------+----------+--------------+ GSV      Full           Yes      Yes                                 +---------+---------------+---------+-----------+----------+--------------+   +---------+---------------+---------+-----------+----------+--------------+ LEFT     CompressibilityPhasicitySpontaneityPropertiesThrombus Aging +---------+---------------+---------+-----------+----------+--------------+ CFV      Full           Yes      Yes                                 +---------+---------------+---------+-----------+----------+--------------+ SFJ      Full           Yes      Yes                                 +---------+---------------+---------+-----------+----------+--------------+ FV Prox  Full           Yes      Yes                                 +---------+---------------+---------+-----------+----------+--------------+ FV Mid   Full           Yes      Yes                                 +---------+---------------+---------+-----------+----------+--------------+  FV DistalFull           Yes      Yes                                  +---------+---------------+---------+-----------+----------+--------------+ PFV      Full           Yes      Yes                                 +---------+---------------+---------+-----------+----------+--------------+ POP      Full           Yes      Yes                                 +---------+---------------+---------+-----------+----------+--------------+ PTV      Full           Yes      Yes                                 +---------+---------------+---------+-----------+----------+--------------+ PERO     Full           Yes      Yes                                 +---------+---------------+---------+-----------+----------+--------------+ GSV      Full           Yes      Yes                                 +---------+---------------+---------+-----------+----------+--------------+    Findings reported to Routed through Epic due to incorrect phone number. at 3:30 PM.  Summary: BILATERAL: - No evidence of deep vein thrombosis seen in the lower extremities, bilaterally. - No evidence of superficial venous thrombosis in the lower extremities, bilaterally. -No evidence of popliteal cyst, bilaterally.   *See table(s) above for measurements and observations. Electronically signed by Norman Serve on 02/17/2024 at 4:42:27 PM.    Final      Procedures   Medications Ordered in the ED  ondansetron  (ZOFRAN ) injection 4 mg (4 mg Intravenous Not Given 02/19/24 0256)  cefTRIAXone (ROCEPHIN) 1 g in sodium chloride  0.9 % 100 mL IVPB (has no administration in time range)  allopurinol  (ZYLOPRIM ) tablet 100 mg (has no administration in time range)  atorvastatin  (LIPITOR) tablet 40 mg (has no administration in time range)  meclizine  (ANTIVERT ) tablet 12.5 mg (has no administration in time range)  pantoprazole  (PROTONIX ) EC tablet 40 mg (has no administration in time range)  ferrous sulfate  tablet 325 mg (has no administration in time range)  lactated ringers  infusion (has no  administration in time range)  cefTRIAXone (ROCEPHIN) 1 g in sodium chloride  0.9 % 100 mL IVPB (has no administration in time range)  sodium chloride  flush (NS) 0.9 % injection 3 mL (has no administration in time range)  sodium chloride  flush (NS) 0.9 % injection 3 mL (has no administration in time range)  sodium chloride  flush (NS) 0.9 % injection 3 mL (has no administration in time range)  0.9 %  sodium  chloride infusion (has no administration in time range)  acetaminophen  (TYLENOL ) tablet 650 mg (has no administration in time range)    Or  acetaminophen  (TYLENOL ) suppository 650 mg (has no administration in time range)  ondansetron  (ZOFRAN ) tablet 4 mg (has no administration in time range)    Or  ondansetron  (ZOFRAN ) injection 4 mg (has no administration in time range)  insulin  aspart (novoLOG ) injection 0-6 Units (has no administration in time range)  insulin  aspart (novoLOG ) injection 0-5 Units (has no administration in time range)  metoprolol  succinate (TOPROL -XL) 24 hr tablet 12.5 mg (has no administration in time range)    Clinical Course as of 02/19/24 0407  Sun Feb 19, 2024  0153 Corrected sodium 135 [CG]  0154 Urinary frequency, possibly cystitis? [CG]    Clinical Course User Index [CG] Ruthell Lonni FALCON, PA-C   Medical Decision Making Amount and/or Complexity of Data Reviewed Labs: ordered. Radiology: ordered.  Risk Prescription drug management. Decision regarding hospitalization.   83 year old female presents for evaluation.  Please see HPI for further details.  On examination the patient is afebrile and nontachycardic.  Her lung sounds are clear bilaterally, she is not hypoxic.  Abdomen soft and compressible with no tenderness.  Neurological examination at baseline.  Overall nontoxic in appearance.  Differential diagnosis for the patient is broad.  Will assess with CBC, CMP, lipase, CBG monitoring, urinalysis.  Will also collect transvaginal ultrasound imaging.   Patient has history of hysterectomy in chart however unsure if this is partial or complete.  Patient daughter at bedside unable to provide additional details.  Patient CBC with leukocytosis 15.8, baseline hemoglobin 10.7.  Metabolic panel with sodium 132, glucose 208.  When corrected for hyperglycemia, sodium 135.  Creatinine baseline 1.5.  Total bilirubin has elevated at 1.6, GFR 27.  Urinalysis unreadable.  Lipase 25.  Ultrasound imaging unremarkable.  CT scan of patient abdomen reveals changes consistent with severe emphysematous cystitis.  There is some dissection of air into the soft tissue surrounding the bladder.  There is dilatation of the left collecting system and left ureter to the level of the ureterovesical junction without evidence of obstruction.  At this time, patient has received 1 g Rocephin.  Patient will be mated to the hospital.  Discussed with Dr. Sundil who has agreed to admit the patient.  The patient is amenable to the plan.  Stable on admission.    Final diagnoses:  Hemorrhagic cystitis    ED Discharge Orders     None          Ruthell Lonni FALCON DEVONNA 02/19/24 0407    Palumbo, April, MD 02/19/24 0430

## 2024-02-19 ENCOUNTER — Observation Stay (HOSPITAL_COMMUNITY)

## 2024-02-19 ENCOUNTER — Encounter (HOSPITAL_COMMUNITY): Payer: Self-pay | Admitting: Internal Medicine

## 2024-02-19 ENCOUNTER — Emergency Department (HOSPITAL_COMMUNITY)

## 2024-02-19 DIAGNOSIS — F39 Unspecified mood [affective] disorder: Secondary | ICD-10-CM | POA: Insufficient documentation

## 2024-02-19 DIAGNOSIS — G629 Polyneuropathy, unspecified: Secondary | ICD-10-CM

## 2024-02-19 DIAGNOSIS — N3091 Cystitis, unspecified with hematuria: Secondary | ICD-10-CM | POA: Diagnosis not present

## 2024-02-19 DIAGNOSIS — N189 Chronic kidney disease, unspecified: Secondary | ICD-10-CM | POA: Diagnosis not present

## 2024-02-19 DIAGNOSIS — M1000 Idiopathic gout, unspecified site: Secondary | ICD-10-CM | POA: Diagnosis not present

## 2024-02-19 DIAGNOSIS — E119 Type 2 diabetes mellitus without complications: Secondary | ICD-10-CM

## 2024-02-19 DIAGNOSIS — E7849 Other hyperlipidemia: Secondary | ICD-10-CM | POA: Diagnosis not present

## 2024-02-19 DIAGNOSIS — F411 Generalized anxiety disorder: Secondary | ICD-10-CM | POA: Insufficient documentation

## 2024-02-19 DIAGNOSIS — I10 Essential (primary) hypertension: Secondary | ICD-10-CM | POA: Diagnosis not present

## 2024-02-19 DIAGNOSIS — N179 Acute kidney failure, unspecified: Secondary | ICD-10-CM

## 2024-02-19 DIAGNOSIS — R29818 Other symptoms and signs involving the nervous system: Secondary | ICD-10-CM | POA: Diagnosis not present

## 2024-02-19 DIAGNOSIS — F0280 Dementia in other diseases classified elsewhere without behavioral disturbance: Secondary | ICD-10-CM | POA: Insufficient documentation

## 2024-02-19 LAB — CBC WITH DIFFERENTIAL/PLATELET
Abs Immature Granulocytes: 0.12 10*3/uL — ABNORMAL HIGH (ref 0.00–0.07)
Basophils Absolute: 0 10*3/uL (ref 0.0–0.1)
Basophils Relative: 0 %
Eosinophils Absolute: 0 10*3/uL (ref 0.0–0.5)
Eosinophils Relative: 0 %
HCT: 34.5 % — ABNORMAL LOW (ref 36.0–46.0)
Hemoglobin: 10.7 g/dL — ABNORMAL LOW (ref 12.0–15.0)
Immature Granulocytes: 1 %
Lymphocytes Relative: 5 %
Lymphs Abs: 0.8 10*3/uL (ref 0.7–4.0)
MCH: 25 pg — ABNORMAL LOW (ref 26.0–34.0)
MCHC: 31 g/dL (ref 30.0–36.0)
MCV: 80.6 fL (ref 80.0–100.0)
Monocytes Absolute: 1.1 10*3/uL — ABNORMAL HIGH (ref 0.1–1.0)
Monocytes Relative: 7 %
Neutro Abs: 13.8 10*3/uL — ABNORMAL HIGH (ref 1.7–7.7)
Neutrophils Relative %: 87 %
Platelets: 285 10*3/uL (ref 150–400)
RBC: 4.28 MIL/uL (ref 3.87–5.11)
RDW: 15.9 % — ABNORMAL HIGH (ref 11.5–15.5)
WBC: 15.8 10*3/uL — ABNORMAL HIGH (ref 4.0–10.5)
nRBC: 0 % (ref 0.0–0.2)

## 2024-02-19 LAB — COMPREHENSIVE METABOLIC PANEL WITH GFR
ALT: 11 U/L (ref 0–44)
AST: 18 U/L (ref 15–41)
Albumin: 3.7 g/dL (ref 3.5–5.0)
Alkaline Phosphatase: 78 U/L (ref 38–126)
Anion gap: 13 (ref 5–15)
BUN: 34 mg/dL — ABNORMAL HIGH (ref 8–23)
CO2: 24 mmol/L (ref 22–32)
Calcium: 8.8 mg/dL — ABNORMAL LOW (ref 8.9–10.3)
Chloride: 95 mmol/L — ABNORMAL LOW (ref 98–111)
Creatinine, Ser: 1.85 mg/dL — ABNORMAL HIGH (ref 0.44–1.00)
GFR, Estimated: 27 mL/min — ABNORMAL LOW (ref >60)
Glucose, Bld: 208 mg/dL — ABNORMAL HIGH (ref 70–99)
Potassium: 4.3 mmol/L (ref 3.5–5.1)
Sodium: 132 mmol/L — ABNORMAL LOW (ref 135–145)
Total Bilirubin: 1.6 mg/dL — ABNORMAL HIGH (ref 0.0–1.2)
Total Protein: 7.6 g/dL (ref 6.5–8.1)

## 2024-02-19 LAB — LIPASE, BLOOD: Lipase: 25 U/L (ref 11–51)

## 2024-02-19 LAB — CBC
HCT: 34.4 % — ABNORMAL LOW (ref 36.0–46.0)
Hemoglobin: 10.8 g/dL — ABNORMAL LOW (ref 12.0–15.0)
MCH: 25.3 pg — ABNORMAL LOW (ref 26.0–34.0)
MCHC: 31.4 g/dL (ref 30.0–36.0)
MCV: 80.6 fL (ref 80.0–100.0)
Platelets: 288 10*3/uL (ref 150–400)
RBC: 4.27 MIL/uL (ref 3.87–5.11)
RDW: 15.9 % — ABNORMAL HIGH (ref 11.5–15.5)
WBC: 16.4 10*3/uL — ABNORMAL HIGH (ref 4.0–10.5)
nRBC: 0 % (ref 0.0–0.2)

## 2024-02-19 LAB — BASIC METABOLIC PANEL WITH GFR
Anion gap: 7 (ref 5–15)
BUN: 33 mg/dL — ABNORMAL HIGH (ref 8–23)
CO2: 27 mmol/L (ref 22–32)
Calcium: 8.5 mg/dL — ABNORMAL LOW (ref 8.9–10.3)
Chloride: 99 mmol/L (ref 98–111)
Creatinine, Ser: 1.7 mg/dL — ABNORMAL HIGH (ref 0.44–1.00)
GFR, Estimated: 29 mL/min — ABNORMAL LOW (ref >60)
Glucose, Bld: 181 mg/dL — ABNORMAL HIGH (ref 70–99)
Potassium: 3.9 mmol/L (ref 3.5–5.1)
Sodium: 133 mmol/L — ABNORMAL LOW (ref 135–145)

## 2024-02-19 LAB — URINALYSIS, ROUTINE W REFLEX MICROSCOPIC
RBC / HPF: >50 RBC/hpf (ref 0–5)
WBC, UA: >50 WBC/hpf (ref 0–5)

## 2024-02-19 LAB — CBG MONITORING, ED
Glucose-Capillary: 106 mg/dL — ABNORMAL HIGH (ref 70–99)
Glucose-Capillary: 155 mg/dL — ABNORMAL HIGH (ref 70–99)
Glucose-Capillary: 175 mg/dL — ABNORMAL HIGH (ref 70–99)

## 2024-02-19 LAB — GLUCOSE, CAPILLARY
Glucose-Capillary: 105 mg/dL — ABNORMAL HIGH (ref 70–99)
Glucose-Capillary: 135 mg/dL — ABNORMAL HIGH (ref 70–99)

## 2024-02-19 MED ORDER — LACTATED RINGERS IV BOLUS
500.0000 mL | Freq: Once | INTRAVENOUS | Status: AC
  Administered 2024-02-19: 500 mL via INTRAVENOUS

## 2024-02-19 MED ORDER — MECLIZINE HCL 12.5 MG PO TABS: 12.5000 mg | ORAL_TABLET | Freq: Two times a day (BID) | ORAL | Status: DC | PRN

## 2024-02-19 MED ORDER — SODIUM CHLORIDE 0.9 % IV SOLN: 1.0000 g | INTRAVENOUS | Status: DC

## 2024-02-19 MED ORDER — SODIUM CHLORIDE 0.9 % IV SOLN
2.0000 g | INTRAVENOUS | Status: DC
  Administered 2024-02-19 – 2024-02-21 (×3): 2 g via INTRAVENOUS
  Filled 2024-02-19 (×3): qty 12.5

## 2024-02-19 MED ORDER — LACTATED RINGERS IV SOLN: INTRAVENOUS | Status: DC

## 2024-02-19 MED ORDER — INSULIN ASPART 100 UNIT/ML IJ SOLN
0.0000 [IU] | Freq: Three times a day (TID) | INTRAMUSCULAR | Status: DC
  Administered 2024-02-19 – 2024-02-20 (×2): 1 [IU] via SUBCUTANEOUS
  Administered 2024-02-20: 2 [IU] via SUBCUTANEOUS

## 2024-02-19 MED ORDER — PANTOPRAZOLE SODIUM 40 MG PO TBEC
40.0000 mg | DELAYED_RELEASE_TABLET | Freq: Every day | ORAL | Status: DC
  Administered 2024-02-19 – 2024-02-22 (×4): 40 mg via ORAL
  Filled 2024-02-19 (×4): qty 1

## 2024-02-19 MED ORDER — ACETAMINOPHEN 650 MG RE SUPP: 650.0000 mg | Freq: Four times a day (QID) | RECTAL | Status: DC | PRN

## 2024-02-19 MED ORDER — ALLOPURINOL 100 MG PO TABS
100.0000 mg | ORAL_TABLET | Freq: Every morning | ORAL | Status: DC
  Administered 2024-02-19 – 2024-02-22 (×4): 100 mg via ORAL
  Filled 2024-02-19 (×4): qty 1

## 2024-02-19 MED ORDER — SODIUM CHLORIDE 0.9 % IV SOLN
250.0000 mL | INTRAVENOUS | Status: AC | PRN
Start: 2024-02-19 — End: 2024-02-20

## 2024-02-19 MED ORDER — METOPROLOL SUCCINATE ER 25 MG PO TB24: 25.0000 mg | ORAL_TABLET | Freq: Every day | ORAL | Status: DC

## 2024-02-19 MED ORDER — SODIUM CHLORIDE 0.9% FLUSH: 3.0000 mL | INTRAVENOUS | Status: DC | PRN

## 2024-02-19 MED ORDER — ATORVASTATIN CALCIUM 40 MG PO TABS
40.0000 mg | ORAL_TABLET | Freq: Every day | ORAL | Status: DC
  Administered 2024-02-19 – 2024-02-21 (×3): 40 mg via ORAL
  Filled 2024-02-19 (×3): qty 1

## 2024-02-19 MED ORDER — TRIMETHOBENZAMIDE HCL 100 MG/ML IM SOLN: 200.0000 mg | Freq: Three times a day (TID) | INTRAMUSCULAR | Status: DC | PRN

## 2024-02-19 MED ORDER — FERROUS SULFATE 325 (65 FE) MG PO TABS
325.0000 mg | ORAL_TABLET | Freq: Two times a day (BID) | ORAL | Status: DC
  Administered 2024-02-19 – 2024-02-22 (×7): 325 mg via ORAL
  Filled 2024-02-19 (×7): qty 1

## 2024-02-19 MED ORDER — INSULIN ASPART 100 UNIT/ML IJ SOLN
0.0000 [IU] | Freq: Every day | INTRAMUSCULAR | Status: DC
  Administered 2024-02-20: 3 [IU] via SUBCUTANEOUS

## 2024-02-19 MED ORDER — CHLORHEXIDINE GLUCONATE CLOTH 2 % EX PADS
6.0000 | MEDICATED_PAD | Freq: Every day | CUTANEOUS | Status: DC
  Administered 2024-02-20 – 2024-02-21 (×2): 6 via TOPICAL

## 2024-02-19 MED ORDER — ACETAMINOPHEN 325 MG PO TABS
650.0000 mg | ORAL_TABLET | Freq: Four times a day (QID) | ORAL | Status: DC | PRN
  Administered 2024-02-19 – 2024-02-21 (×3): 650 mg via ORAL
  Filled 2024-02-19 (×3): qty 2

## 2024-02-19 MED ORDER — SODIUM CHLORIDE 0.9% FLUSH
3.0000 mL | Freq: Two times a day (BID) | INTRAVENOUS | Status: DC
  Administered 2024-02-19 – 2024-02-20 (×3): 3 mL via INTRAVENOUS

## 2024-02-19 MED ORDER — HYDRALAZINE HCL 10 MG PO TABS: 10.0000 mg | ORAL_TABLET | Freq: Four times a day (QID) | ORAL | Status: DC | PRN

## 2024-02-19 MED ORDER — SODIUM CHLORIDE 0.9% FLUSH
3.0000 mL | Freq: Two times a day (BID) | INTRAVENOUS | Status: DC
  Administered 2024-02-19 – 2024-02-20 (×2): 3 mL via INTRAVENOUS

## 2024-02-19 MED ORDER — AMLODIPINE BESYLATE 5 MG PO TABS: 5.0000 mg | ORAL_TABLET | Freq: Every day | ORAL | Status: DC

## 2024-02-19 MED ORDER — ONDANSETRON HCL 4 MG/2ML IJ SOLN
4.0000 mg | Freq: Four times a day (QID) | INTRAMUSCULAR | Status: DC | PRN
Start: 2024-02-19 — End: 2024-02-22

## 2024-02-19 MED ORDER — ONDANSETRON HCL 4 MG PO TABS: 4.0000 mg | ORAL_TABLET | Freq: Four times a day (QID) | ORAL | Status: DC | PRN

## 2024-02-19 MED ORDER — SODIUM CHLORIDE 0.9 % IV SOLN
1.0000 g | Freq: Once | INTRAVENOUS | Status: AC
  Administered 2024-02-19: 1 g via INTRAVENOUS
  Filled 2024-02-19: qty 10

## 2024-02-19 MED ORDER — METOPROLOL SUCCINATE ER 25 MG PO TB24: 12.5000 mg | ORAL_TABLET | Freq: Every day | ORAL | Status: DC

## 2024-02-19 NOTE — ED Notes (Signed)
 BP cuff repositioned and size changed at this time

## 2024-02-19 NOTE — Evaluation (Signed)
 Physical Therapy Evaluation Patient Details Name: Krista Clarke MRN: [id number] DOB: 1940/11/20 Today's Date: 02/19/2024  History of Present Illness  83 yo bib [facility] 02/18/24 from home with complaints of vaginal bleeding, generalized lower abdominal pain, and dysuria. +UTI, hemorrhagic cystitis; AKI; PMH-dementia, DM, anxiety, chronic narcotic dependence, HTN, peripheral neuropathy, s/p pacemaker for CHB, hysterectomy  Clinical Impression   Pt admitted secondary to problem above with deficits below. PTA patient lives alone (per pt son only occasionally stays overnight). She has 3 steps without a rail to get into her one level home. She walks with a RW and states recently it has been getting more difficult to walk and especially to get up/down her steps. Pt currently required up to mod assist for bed mobility, CGA to stand from elevated surface (ED stretcher), and unable to fully assess gait as transport arrived to take pt to [place]. Based on her description of incr difficulty at home, anticipate she would benefit from inpatient therapies <3 hrs/day. Did not have a chance to discuss this recommendation with pt as she was leaving for a test. Anticipate patient will benefit from PT to address problems listed below. Will continue to follow acutely to maximize functional mobility, independence, and safety.           If plan is discharge home, recommend the following: A little help with walking and/or transfers;A little help with bathing/dressing/bathroom;Assist for transportation;Help with stairs or ramp for entrance   Can travel by private vehicle   No    Equipment Recommendations None recommended by PT  Recommendations for Other Services  OT consult    Functional Status Assessment Patient has had a recent decline in their functional status and demonstrates the ability to make significant improvements in function in a reasonable and predictable amount of time.     Precautions / Restrictions  Precautions Precautions: Fall Recall of Precautions/Restrictions: Intact Precaution/Restrictions Comments: denies h/o falls; reports dtr called EMS as she was afraid pt would fall down steps      Mobility  Bed Mobility Overal bed mobility: Needs Assistance Bed Mobility: Supine to Sit, Sit to Supine     Supine to sit: Contact guard, Used rails, HOB elevated Sit to supine: Mod assist   General bed mobility comments: on ED stretcher; pt with difficulty sitting back on higher surface and required assist to bring her legs up onto stretcher to prevent slip/fall    Transfers Overall transfer level: Needs assistance Equipment used: Rolling walker (2 wheels) Transfers: Sit to/from Stand Sit to Stand: Contact guard assist           General transfer comment: vc for proper hand placement    Ambulation/Gait             Pre-gait activities: side-steps x2 ft with RW (transport arrived to take to test)    Stairs            Wheelchair Mobility     Tilt Bed    Modified Rankin (Stroke Patients Only)       Balance Overall balance assessment: Needs assistance Sitting-balance support: Bilateral upper extremity supported, Feet unsupported Sitting balance-Leahy Scale: Fair     Standing balance support: Bilateral upper extremity supported Standing balance-Leahy Scale: Poor                               Pertinent Vitals/Pain Pain Assessment Pain Assessment: Faces Faces Pain Scale: Hurts little more Pain Location: RUE  Pain Descriptors / Indicators: Grimacing, Guarding Pain Intervention(s): Limited activity within patient's tolerance, Monitored during session    Home Living Family/patient expects to be discharged to:: Private residence Living Arrangements: Alone Available Help at Discharge: Family;Available PRN/intermittently (son spends the night sometimes) Type of Home: House Home Access: Stairs to enter Entrance Stairs-Rails: None Entrance  Stairs-Number of Steps: 4   Home Layout: One level Home Equipment: Educational psychologist (4 wheels);Rolling Walker (2 wheels);Cane - single point      Prior Function Prior Level of Function : Independent/Modified Independent             Mobility Comments: walks with RW; stairs have been getting more difficult lately ADLs Comments: no driving; dtr does grocery shopping     Extremity/Trunk Assessment   Upper Extremity Assessment Upper Extremity Assessment: Defer to OT evaluation (reporting Rt shoulder pain)    Lower Extremity Assessment Lower Extremity Assessment: Generalized weakness (knee extension 3+ bil)    Cervical / Trunk Assessment Cervical / Trunk Assessment: Other exceptions Cervical / Trunk Exceptions: overweight  Communication   Communication Communication: No apparent difficulties    Cognition Arousal: Alert Behavior During Therapy: WFL for tasks assessed/performed   PT - Cognitive impairments: History of cognitive impairments                       PT - Cognition Comments: a&ox4 (to year) Following commands: Intact       Cueing Cueing Techniques: Verbal cues     General Comments General comments (skin integrity, edema, etc.): HR 80s throughout; sat monitor not registering throughout (poor pleth)    Exercises     Assessment/Plan    PT Assessment Patient needs continued PT services  PT Problem List Decreased strength;Decreased balance;Decreased mobility;Decreased knowledge of use of DME;Obesity;Pain       PT Treatment Interventions DME instruction;Gait training;Stair training;Functional mobility training;Therapeutic activities;Therapeutic exercise;Balance training;Patient/family education    PT Goals (Current goals can be found in the Care Plan section)  Acute Rehab PT Goals Patient Stated Goal: wants to get stronger PT Goal Formulation: With patient Time For Goal Achievement: 03/04/24 Potential to Achieve Goals: Good    Frequency  Min 2X/week     Co-evaluation               AM-PAC PT 6 Clicks Mobility  Outcome Measure Help needed turning from your back to your side while in a flat bed without using bedrails?: A Little Help needed moving from lying on your back to sitting on the side of a flat bed without using bedrails?: A Little Help needed moving to and from a bed to a chair (including a wheelchair)?: A Little Help needed standing up from a chair using your arms (e.g., wheelchair or bedside chair)?: A Little Help needed to walk in hospital room?: Total (<20 ft) Help needed climbing 3-5 steps with a railing? : Total 6 Click Score: 14    End of Session Equipment Utilized During Treatment: Gait belt Activity Tolerance: Patient tolerated treatment well Patient left: in bed;with call bell/phone within reach;Other (comment) (transporter) Nurse Communication: Mobility status PT Visit Diagnosis: Muscle weakness (generalized) (M62.81);Difficulty in walking, not elsewhere classified (R26.2)    Time: 8682-8654 PT Time Calculation (min) (ACUTE ONLY): 28 min   Charges:   PT Evaluation $PT Eval Low Complexity: 1 Low PT Treatments $Gait Training: 8-22 mins PT General Charges $$ ACUTE PT VISIT: 1 Visit          Macario RAMAN, PT  Acute Rehabilitation Services  Office [phone]   Macario SHAUNNA Soja 02/19/2024, 2:04 PM

## 2024-02-19 NOTE — ED Notes (Signed)
Called lab to add on urine culture ?

## 2024-02-19 NOTE — Plan of Care (Signed)

## 2024-02-19 NOTE — H&P (Addendum)
 History and Physical    Krista Clarke FMW:[id number] DOB: [birth year] DOA: 02/18/2024  PCP: Jodie Lavern LITTIE, MD   Patient coming from: Home   Chief Complaint:  Chief Complaint  Patient presents with   Vaginal Bleeding   Abdominal Pain   ED TRIAGE note:  Pt bib [facility] from home with complaints of vaginal bleeding, generalized lower abdominal pain, and dysuria that all started tonight. Pt does have dementia so daughter is at bedside to provide triage information. Daughter states that she had one brief with blood in it and noticed blood clots in the toilet after using the bathroom. Pt has had nausea but denies vomiting or diarrhea.     HPI:  Krista Clarke is a 83 y.o. female with medical history significant of DM type II, Alzheimer disease, mood disturbance, anxiety, chronic narcotic dependence, essential hypertension, peripheral neuropathy and history of complete heart block s/p  post pacemaker presented to emergency department complaining of dysuria, vaginal bleeding and lower abdominal pain. Patient daughter reports that today around 51 or 1 PM patient began complaining of abdominal pain, dysuria.  Patient denies history of UTIs, dysuria and the patient.  Patient daughter reports that about 2 or 3 hours later, she checked her mother's brief and noted that it had blood clots, bloody urine soaked into the diaper.  The patient began complaining of lower abdominal pain around this time.  She endorses 1 episode of nausea without vomiting.  Denies diarrhea or blood in stool.  But denies blood thinners.  Denies history of vaginal bleeding.  Patient denies any flank pain, fevers.  Patient currently alert and oriented to baseline. Patient has history of abdominal hysterectomy however unsure if complete or partial.   During my evaluation at the bedside patient's daughter reported that at home patient has some chills, dysuria, lower abdominal pain and she noticed blood in the diaper. Patient does not have any  complaint at this time.  ED Course:  At presentation to ED hemodynamically stable. UA showing evidence of UTI however further lab unable to unrefined due to turbid and red appearance. Normal lipase level.  CMP showing low sodium 132, elevated blood glucose 2 8, elevated creatinine 1.85. CBC showing leukocytosis 15.8, stable H&H and normal platelet count.  Ultrasound pelvis status post hysterectomy. Transvaginal ultrasound status post hysterectomy and nonvisualization of ovaries.  CT abdomen pelvis: Changes consistent with severe emphysematous cystitis. Some dissection of air into the soft tissue surrounding the bladder is noted as well.  Dilatation of the left collecting system and left ureter to the level of the ureterovesical junction without evidence of obstructing lesion. This is likely related to the underlying changes in the bladder wall. Dependent density in the gallbladder as described.  In the ED patient has been given ceftriaxone 1 g and Zofran .  Hospitalist has been consulted for management of hemorrhagic cystitis.  Significant labs in the ED: Lab Orders         Urine Culture (for pregnant, neutropenic or urologic patients or patients with an indwelling urinary catheter)         CBC with Differential         Comprehensive metabolic panel         Lipase, blood         Urinalysis, Routine w reflex microscopic -Urine, Catheterized         CBC         Basic metabolic panel         CBG monitoring, ED  Review of Systems:  Review of Systems  Constitutional:  Positive for chills. Negative for fever, malaise/fatigue and weight loss.  Respiratory:  Negative for cough and sputum production.   Cardiovascular:  Negative for chest pain.  Gastrointestinal:  Negative for abdominal pain, blood in stool, constipation, diarrhea, heartburn, melena, nausea and vomiting.  Genitourinary:  Positive for dysuria and hematuria. Negative for flank pain, frequency and urgency.   Musculoskeletal:  Negative for back pain, falls, joint pain, myalgias and neck pain.  Neurological:  Negative for dizziness and headaches.  Psychiatric/Behavioral:  The patient is not nervous/anxious.     Past Medical History:  Diagnosis Date   Anemia    takes iron daily   Arthritis    Complete heart block (HCC) 08/20/2015   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     Diabetes mellitus without complication (HCC)    Encounter for care of pacemaker 05/24/2019   Hypercholesteremia    Hypertension    Pacemaker; 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 Addie 2010 lumbar MRI, multilevel and severe.    Past Surgical History:  Procedure Laterality Date   ABDOMINAL HYSTERECTOMY     COLONOSCOPY WITH PROPOFOL  N/A 07/03/2013   Procedure: COLONOSCOPY WITH PROPOFOL ;  Surgeon: Gladis MARLA Louder, MD;  Location: [facility];  Service: Endoscopy;  Laterality: N/A;   EP IMPLANTABLE DEVICE N/A 08/20/2015   Procedure: Pacemaker Implant;  Surgeon: Danelle LELON Birmingham, MD;  Location: [facility] INVASIVE CV LAB;  Service: Cardiovascular;  Laterality: N/A;   JOINT REPLACEMENT Bilateral    '99. Knees   LEFT HEART CATHETERIZATION WITH CORONARY ANGIOGRAM N/A 08/27/2014   Procedure: LEFT HEART CATHETERIZATION WITH CORONARY ANGIOGRAM;  Surgeon: Erick JONELLE Bergamo, MD;  Location: [facility] CATH LAB;  Service: Cardiovascular;  Laterality: N/A;   TUBAL LIGATION       reports that she has never smoked. She has never used smokeless tobacco. She reports that she does not drink alcohol  and does not use drugs.  Allergies  Allergen Reactions   Prednisone Other (See Comments), Palpitations and Rash    Doesn't work well for patient    Family History  Problem Relation Age of Onset   Hypertension Mother    Diabetes Mother    Hypertension Father    Cancer Brother    Breast cancer Granddaughter         30s    Prior to Admission medications   Medication Sig Start Date End Date Taking? Authorizing Provider  allopurinol  (ZYLOPRIM ) 100 MG tablet TAKE 1 TABLET BY MOUTH EVERY MORNING 09/15/23   [name], MD  amLODipine  (NORVASC ) 5 MG tablet TAKE 1 TABLET BY MOUTH EVERY DAY 12/08/23   [name], MD  ascorbic acid  (VITAMIN C ) 500 MG tablet TAKE 1 TABLET BY MOUTH EVERY DAY 11/08/23   [name], MD  ASPIRIN  LOW DOSE 81 MG chewable tablet CHEW ONE TABLET IN MOUTH EVERY DAY 02/03/24   [name], MD  atorvastatin  (LIPITOR) 40 MG tablet Take 1 tablet (40 mg total) by mouth at bedtime. 06/06/23   [name], MD  cyanocobalamin  (VITAMIN B12) 1000 MCG tablet TAKE 1 TABLET BY MOUTH EVERY DAY 12/08/23   [name], MD  dapagliflozin  propanediol (FARXIGA ) 10 MG TABS tablet Take 1 tablet (10 mg total) by mouth daily. 02/07/23   [name], MD  diclofenac  Sodium (VOLTAREN ) 1 % GEL Apply 4 GRAMS topically 4 TIMES daily AS NEEDED FOR PAIN 05/24/23   [name], MD  FEROSUL 325 (65 Fe) MG tablet TAKE 1 TABLET BY MOUTH 2 TIMES DAILY 02/03/24   [name], MD  furosemide  (LASIX ) 40 MG tablet TAKE 1 TABLET BY MOUTH EVERY DAY 04/19/23   Webb, Padonda B, FNP  HYDROcodone -acetaminophen  (NORCO/VICODIN) 5-325 MG tablet TAKE 1 TABLET BY MOUTH 2 TIMES DAILY 02/16/24   [name], MD  losartan  (COZAAR ) 100 MG tablet TAKE 1 TABLET BY MOUTH EVERY DAY 09/15/23   [name], MD  Lutein  20 MG CAPS TAKE 1 CAPSULE BY MOUTH EVERY DAY 11/08/23   [name], MD  magnesium  oxide (MAG-OX) 400 (240 Mg) MG tablet TAKE 1 TABLET BY MOUTH ONCE DAILY 12/08/23   [name], MD  meclizine  (ANTIVERT ) 12.5 MG tablet Take 1 tablet (12.5 mg total) by mouth 2 (two) times daily as needed for dizziness. 08/26/20   [name], MD  metoprolol  succinate (TOPROL -XL) 25 MG 24 hr tablet TAKE 1 TABLET BY MOUTH EVERY DAY 12/08/23   [name], MD  omeprazole  (PRILOSEC) 20 MG capsule TAKE 1 CAPSULE  BY MOUTH EVERY DAY BEFORE BREAKFAST 10/12/23   [name], MD  triamcinolone  (KENALOG ) 0.1 % Apply 1 application topically 2 (two) times daily. For 2 weeks, then as needed 10/17/20   [name], MD     Physical Exam: Vitals:   02/19/24 0045 02/19/24 0246 02/19/24 0255 02/19/24 0400  BP: (!) 141/55  (!) 122/95 (!) 92/32  Pulse: 79  83 83  Resp: (!) 26  (!) 22 19  Temp:  98.8 F (37.1 C)    TempSrc:  Oral    SpO2: 100%  100% 100%    Physical Exam Vitals and nursing note reviewed.   Cardiovascular:     Rate and Rhythm: Normal rate and regular rhythm.  Abdominal:     General: Abdomen is flat. Bowel sounds are increased. There is no distension.     Palpations: Abdomen is soft.     Tenderness: There is no abdominal tenderness. There is no right CVA tenderness, left CVA tenderness, guarding or rebound.   Skin:    General: Skin is dry.     Capillary Refill: Capillary refill takes less than 2 seconds.   Neurological:     Mental Status: She is alert and oriented to person, place, and time.   Psychiatric:        Mood and Affect: Mood normal. Mood is not anxious or depressed.      Labs on Admission: I have personally reviewed following labs and imaging studies  CBC: Recent Labs  Lab 02/19/24 0116  WBC 15.8*  NEUTROABS 13.8*  HGB 10.7*  HCT 34.5*  MCV 80.6  PLT 285   Basic Metabolic Panel: Recent Labs  Lab 02/19/24 0116  NA 132*  K 4.3  CL 95*  CO2 24  GLUCOSE 208*  BUN 34*  CREATININE 1.85*  CALCIUM  8.8*   GFR: Estimated Creatinine Clearance: 24.3 mL/min (A) (by C-G formula based on SCr of 1.85 mg/dL (H)). Liver Function Tests: Recent Labs  Lab 02/19/24 0116  AST 18  ALT 11  ALKPHOS 78  BILITOT 1.6*  PROT 7.6  ALBUMIN 3.7   Recent Labs  Lab 02/19/24 0116  LIPASE 25   No results for input(s): AMMONIA in the last 168 hours. Coagulation Profile: No results for input(s):  INR, PROTIME in the last 168 hours. Cardiac Enzymes: No  results for input(s): CKTOTAL, CKMB, CKMBINDEX, TROPONINI, TROPONINIHS in the last 168 hours. BNP (last 3 results) Recent Labs    07/11/23 1241  BNP 97.7   HbA1C: No results for input(s): HGBA1C in the last 72 hours. CBG: Recent Labs  Lab 02/19/24 0005  GLUCAP 175*   Lipid Profile: No results for input(s): CHOL, HDL, LDLCALC, TRIG, CHOLHDL, LDLDIRECT in the last 72 hours. Thyroid  Function Tests: No results for input(s): TSH, T4TOTAL, FREET4, T3FREE, THYROIDAB in the last 72 hours. Anemia Panel: No results for input(s): VITAMINB12, FOLATE, FERRITIN, TIBC, IRON, RETICCTPCT in the last 72 hours. Urine analysis:    Component Value Date/Time   COLORURINE RED (A) 02/19/2024 0116   APPEARANCEUR TURBID (A) 02/19/2024 0116   LABSPEC  02/19/2024 0116    TEST NOT REPORTED DUE TO COLOR INTERFERENCE OF URINE PIGMENT   PHURINE  02/19/2024 0116    TEST NOT REPORTED DUE TO COLOR INTERFERENCE OF URINE PIGMENT   GLUCOSEU (A) 02/19/2024 0116    TEST NOT REPORTED DUE TO COLOR INTERFERENCE OF URINE PIGMENT   GLUCOSEU NEGATIVE 10/17/2020 1202   HGBUR (A) 02/19/2024 0116    TEST NOT REPORTED DUE TO COLOR INTERFERENCE OF URINE PIGMENT   BILIRUBINUR (A) 02/19/2024 0116    TEST NOT REPORTED DUE TO COLOR INTERFERENCE OF URINE PIGMENT   BILIRUBINUR 1+ 10/17/2020 1105   KETONESUR (A) 02/19/2024 0116    TEST NOT REPORTED DUE TO COLOR INTERFERENCE OF URINE PIGMENT   PROTEINUR (A) 02/19/2024 0116    TEST NOT REPORTED DUE TO COLOR INTERFERENCE OF URINE PIGMENT   UROBILINOGEN 0.2 02/26/2022 1800   NITRITE (A) 02/19/2024 0116    TEST NOT REPORTED DUE TO COLOR INTERFERENCE OF URINE PIGMENT   LEUKOCYTESUR (A) 02/19/2024 0116    TEST NOT REPORTED DUE TO COLOR INTERFERENCE OF URINE PIGMENT    Radiological Exams on Admission: I have personally reviewed images DG Lumbar Spine Complete Result Date: 02/19/2024 CLINICAL DATA:  Worsening low back pain EXAM: LUMBAR  SPINE - COMPLETE 4+ VIEW COMPARISON:  None Available. FINDINGS: Five lumbar type vertebral bodies are well visualized. Vertebral body height is well maintained. Osteophytic changes and disc space narrowing is noted. No pars defects are seen. Facet hypertrophic changes are noted. No soft tissue abnormality is seen. IMPRESSION: Multilevel degenerative change without acute abnormality. Electronically Signed   By: Oneil Devonshire M.D.   On: 02/19/2024 02:38   CT ABDOMEN PELVIS WO CONTRAST Result Date: 02/19/2024 CLINICAL DATA:  Vaginal bleeding, possible hematuria EXAM: CT ABDOMEN AND PELVIS WITHOUT CONTRAST TECHNIQUE: Multidetector CT imaging of the abdomen and pelvis was performed following the standard protocol without IV contrast. RADIATION DOSE REDUCTION: This exam was performed according to the departmental dose-optimization program which includes automated exposure control, adjustment of the mA and/or kV according to patient size and/or use of iterative reconstruction technique. COMPARISON:  Ultrasound from earlier in the same day. FINDINGS: Lower chest: No acute abnormality. Hepatobiliary: Gallbladder is well distended with dependent density which may be related to tiny stones or gallbladder sludge. Liver is within normal limits. Pancreas: Unremarkable. No pancreatic ductal dilatation or surrounding inflammatory changes. Spleen: Normal in size without focal abnormality. Adrenals/Urinary Tract: Adrenal glands are within normal limits. Right kidney shows no renal calculi or obstructive change. The left kidney demonstrates dilatation of the collecting system with a prominent extrarenal pelvis. The ureter is dilated to the level of the ureterovesical junction although no discrete stone  is seen. The bladder is partially distended but demonstrates significant emphysematous changes in the wall consistent with emphysematous cystitis. Some air has dissected along the bladder wall into the adjacent tissues. Stomach/Bowel:  The appendix is not well visualized. No inflammatory changes to suggest appendicitis are noted. Stomach and small bowel are unremarkable. Colon shows no significant inflammatory changes. Vascular/Lymphatic: Aortic atherosclerosis. No enlarged abdominal or pelvic lymph nodes. Reproductive: Status post hysterectomy. No adnexal masses. Other: No abdominal wall hernia or abnormality. No abdominopelvic ascites. Musculoskeletal: No acute or significant osseous findings. IMPRESSION: Changes consistent with severe emphysematous cystitis. Some dissection of air into the soft tissue surrounding the bladder is noted as well. Dilatation of the left collecting system and left ureter to the level of the ureterovesical junction without evidence of obstructing lesion. This is likely related to the underlying changes in the bladder wall. Dependent density in the gallbladder as described. Electronically Signed   By: Oneil Devonshire M.D.   On: 02/19/2024 02:36   US  Pelvis Complete Result Date: 02/19/2024 CLINICAL DATA:  Postmenopausal vaginal bleeding EXAM: TRANSABDOMINAL AND TRANSVAGINAL ULTRASOUND OF PELVIS TECHNIQUE: Both transabdominal and transvaginal ultrasound examinations of the pelvis were performed. Transabdominal technique was performed for global imaging of the pelvis including uterus, ovaries, adnexal regions, and pelvic cul-de-sac. It was necessary to proceed with endovaginal exam following the transabdominal exam to visualize the ovaries. COMPARISON:  None Available. FINDINGS: Uterus Surgically removed Right ovary Not well visualized Left ovary Not well visualized Other findings No abnormal free fluid. IMPRESSION: Status post hysterectomy. Nonvisualization of the ovaries. Electronically Signed   By: Oneil Devonshire M.D.   On: 02/19/2024 01:01   US  Transvaginal Non-OB Result Date: 02/19/2024 CLINICAL DATA:  Postmenopausal vaginal bleeding EXAM: TRANSABDOMINAL AND TRANSVAGINAL ULTRASOUND OF PELVIS TECHNIQUE: Both  transabdominal and transvaginal ultrasound examinations of the pelvis were performed. Transabdominal technique was performed for global imaging of the pelvis including uterus, ovaries, adnexal regions, and pelvic cul-de-sac. It was necessary to proceed with endovaginal exam following the transabdominal exam to visualize the ovaries. COMPARISON:  None Available. FINDINGS: Uterus Surgically removed Right ovary Not well visualized Left ovary Not well visualized Other findings No abnormal free fluid. IMPRESSION: Status post hysterectomy. Nonvisualization of the ovaries. Electronically Signed   By: Oneil Devonshire M.D.   On: 02/19/2024 01:01   VAS US  LOWER EXTREMITY VENOUS (DVT) Result Date: 02/17/2024  Lower Venous DVT Study Patient Name:  MEZTLI LLANAS  Date of Exam:   02/17/2024 Medical Rec #: [id number]    Accession #:    [id number] Date of Birth: May 14, 1941     Patient Gender: F Patient Age:   47 years Exam Location:  [address] Procedure:      VAS US  LOWER EXTREMITY VENOUS (DVT) Referring Phys: LUCIE BUTTNER --------------------------------------------------------------------------------  Indications: Edema, and Pain. Other Indications: Right > left, for 1-2 weeks. Risk Factors: Obesity. Anticoagulation: 81 mg ASA. Comparison Study: 04/04/20 bilateral lower extremity negative for DVT. Performing Technologist: [employer] BS, RVT, RDCS  Examination Guidelines: A complete evaluation includes B-mode imaging, spectral Doppler, color Doppler, and power Doppler as needed of all accessible portions of each vessel. Bilateral testing is considered an integral part of a complete examination. Limited examinations for reoccurring indications may be performed as noted. The reflux portion of the exam is performed with the patient in reverse Trendelenburg.  +---------+---------------+---------+-----------+----------+--------------+ RIGHT    CompressibilityPhasicitySpontaneityPropertiesThrombus Aging  +---------+---------------+---------+-----------+----------+--------------+ CFV      Full  Yes      Yes                                 +---------+---------------+---------+-----------+----------+--------------+ SFJ      Full           Yes      Yes                                 +---------+---------------+---------+-----------+----------+--------------+ FV Prox  Full           Yes      Yes                                 +---------+---------------+---------+-----------+----------+--------------+ FV Mid   Full           Yes      Yes                                 +---------+---------------+---------+-----------+----------+--------------+ FV DistalFull           Yes      Yes                                 +---------+---------------+---------+-----------+----------+--------------+ PFV      Full           Yes      Yes                                 +---------+---------------+---------+-----------+----------+--------------+ POP      Full           Yes      Yes                                 +---------+---------------+---------+-----------+----------+--------------+ PTV      Full           Yes      Yes                                 +---------+---------------+---------+-----------+----------+--------------+ PERO     Full           Yes      Yes                                 +---------+---------------+---------+-----------+----------+--------------+ GSV      Full           Yes      Yes                                 +---------+---------------+---------+-----------+----------+--------------+   +---------+---------------+---------+-----------+----------+--------------+ LEFT     CompressibilityPhasicitySpontaneityPropertiesThrombus Aging +---------+---------------+---------+-----------+----------+--------------+ CFV      Full           Yes      Yes                                  +---------+---------------+---------+-----------+----------+--------------+  SFJ      Full           Yes      Yes                                 +---------+---------------+---------+-----------+----------+--------------+ FV Prox  Full           Yes      Yes                                 +---------+---------------+---------+-----------+----------+--------------+ FV Mid   Full           Yes      Yes                                 +---------+---------------+---------+-----------+----------+--------------+ FV DistalFull           Yes      Yes                                 +---------+---------------+---------+-----------+----------+--------------+ PFV      Full           Yes      Yes                                 +---------+---------------+---------+-----------+----------+--------------+ POP      Full           Yes      Yes                                 +---------+---------------+---------+-----------+----------+--------------+ PTV      Full           Yes      Yes                                 +---------+---------------+---------+-----------+----------+--------------+ PERO     Full           Yes      Yes                                 +---------+---------------+---------+-----------+----------+--------------+ GSV      Full           Yes      Yes                                 +---------+---------------+---------+-----------+----------+--------------+    Findings reported to Routed through Epic due to incorrect phone number. at 3:30 PM.  Summary: BILATERAL: - No evidence of deep vein thrombosis seen in the lower extremities, bilaterally. - No evidence of superficial venous thrombosis in the lower extremities, bilaterally. -No evidence of popliteal cyst, bilaterally.   *See table(s) above for measurements and observations. Electronically signed by Norman Serve on 02/17/2024 at 4:42:27 PM.    Final      EKG: My personal interpretation of EKG  shows: EKG showed sinus rhythm heart rate 86, left bundle branch block  and prolonged QTc 493.    Assessment/Plan: Principal Problem:   Hemorrhagic cystitis Active Problems:   Non-insulin  dependent type 2 diabetes mellitus (HCC)   Acute kidney injury superimposed on chronic kidney disease (HCC)   Essential hypertension   Hyperlipidemia   Gout   Diabetic nephropathy associated with type 2 diabetes mellitus (HCC)   Alzheimer disease (HCC)   Mood disorder (HCC)   GAD (generalized anxiety disorder)   Peripheral neuropathy    Assessment and Plan: Hemorrhagic cystitis -Presented to emergency department blood in the diaper and toilet bowl daughter report.  At presentation to ED patient is hemodynamically stable.  Denies any history of GI bleed.  History of hysterectomy. - CBC showing stable H&H, elevated WBC count 15.8, normal platelet count - UA show evidence of UTI and due to hematuria unable to assess further.  Pending urine culture - Ultrasound pelvis and transvaginal ultrasound showed hysterectomy and nonvisualized ovaries. - CT abdomen pelvis without contrast showed Changes consistent with severe emphysematous cystitis. Some dissection of air into the soft tissue surrounding the bladder is noted as well.  Dilatation of the left collecting system and left ureter to the level of the ureterovesical junction without evidence of obstructing lesion. This is likely related to the underlying changes in the bladder wall.   Dependent density in the gallbladder as described. -Source of bleeding secondary to hemorrhagic cystitis - In the ED patient has been given ceftriaxone 1 g. - Continue IV ceftriaxone 1 g daily.  Holding aspirin  in the setting of hemorrhagic cystitis.  Need to avoid antiplatelet anticoagulation. -Holding Jardiance in the setting of acute cystitis.   Acute kidney injury CKD stage IIIb -Elevated creatinine 1.85.  Low sodium 132.  Prerenal acute kidney injury in the setting  of acute cystitis and hypotension. -Holding losartan  and Lasix .  In the setting of acute kidney injury. -Starting maintenance fluid LR 100 cc/h.  Non-insulin -dependent DM type II -Jardiance on hold in setting of UTI. - Continue sliding scale insulin  with mealtime coverage.   Essential hypertension -On presentation patient was normotensive however blood pressure has been gradually dropped to low 90s.  Holding all 4 blood pressure regimen include losartan , Lasix , amlodipine  and Toprol -XL. Starting IV fluid.  Hyperlipidemia -Continue Crestor  History of Alzheimer's disease Mood disorder and generalized anxiety disorder -Currently not any medication at home.  GERD - Continue Protonix   Gout - Continue allopurinol   DVT prophylaxis:  SCDs Code Status:  Full Code Diet: Heart healthy carb modified diet Family Communication:   Family was present at bedside, at the time of interview. Opportunity was given to ask question and all questions were answered satisfactorily.  Disposition Plan: Pending urine culture and monitor improvement of renal function Consults: None indicated at this time Admission status:   Observation, Telemetry bed  Severity of Illness: The appropriate patient status for this patient is OBSERVATION. Observation status is judged to be reasonable and necessary in order to provide the required intensity of service to ensure the patient's safety. The patient's presenting symptoms, physical exam findings, and initial radiographic and laboratory data in the context of their medical condition is felt to place them at decreased risk for further clinical deterioration. Furthermore, it is anticipated that the patient will be medically stable for discharge from the hospital within 2 midnights of admission.     Christana Angelica, MD [facility]  How to contact the [facility] Attending or Consulting provider 7A - 7P or covering provider during after hours 7P -7A, for this  patient.  Check the care team in [facility] and look for a) attending/consulting TRH provider listed and b) the TRH team listed Log into [email or web address] and use [place]'s universal password to access. If you do not have the password, please contact the hospital operator. Locate the TRH provider you are looking for under [facility] and page to a number that you can be directly reached. If you still have difficulty reaching the provider, please page the [facility] (Director on Call) for the Hospitalists listed on amion for assistance.  02/19/2024, 4:12 AM

## 2024-02-19 NOTE — Care Management Obs Status (Signed)
 MEDICARE OBSERVATION STATUS NOTIFICATION   Patient Details  Name: AFFIE GASNER MRN: [id number] Date of Birth: [birth year]   Medicare Observation Status Notification Given:  Yes    Corean JAYSON Canary, RN 02/19/2024, 10:33 AM

## 2024-02-19 NOTE — ED Notes (Signed)
 IV team at bedside

## 2024-02-19 NOTE — Progress Notes (Signed)
 Pharmacy Antibiotic Note  Krista Clarke is a 83 y.o. female for which pharmacy has been consulted for cefepime dosing for Emphysematous cystitis.  Patient with a history of Alzheimer's dementia, DM2, CHB s/p PPM, anxiety/mood disturbance, HTN, peripheral neuropathy chronic narcotic dependence . Patient presenting with generalized lower abdominal pain, dysuria and concern for bleeding.  SCr 1.7 (Baseline Cr 1.6-1.7)  WBC 16.4; T 98.7; HR 59; RR 17  Plan: Will start Cefepime 2g q24hr --consider reduction to 1g q24h once clinical response and UTI severity / sepsis picture more established Monitor WBC, fever, renal function, cultures De-escalate when able     Temp (24hrs), Avg:98.7 F (37.1 C), Min:98.7 F (37.1 C), Max:98.8 F (37.1 C)  Recent Labs  Lab 02/19/24 0116 02/19/24 0458  WBC 15.8* 16.4*  CREATININE 1.85* 1.70*    Estimated Creatinine Clearance: 26.5 mL/min (A) (by C-G formula based on SCr of 1.7 mg/dL (H)).    Allergies  Allergen Reactions   Prednisone Other (See Comments), Palpitations and Rash    Doesn't work well for patient   Microbiology results: Pending  Thank you for allowing pharmacy to be a part of this patient's care.  Dorn Buttner, PharmD, BCPS 02/19/2024 10:49 AM ED Clinical Pharmacist -  [phone]

## 2024-02-19 NOTE — Progress Notes (Addendum)
 PROGRESS NOTE    Krista Clarke  FMW:[id number] DOB: [birth year] DOA: 02/18/2024 PCP: Jodie Lavern LITTIE, MD    Brief Narrative:   Krista Clarke is a 83 y.o. female with past medical history significant for Alzheimer's dementia, DM2, CHB s/p PPM, anxiety/mood disturbance, HTN, peripheral neuropathy chronic narcotic dependence, who presented to [facility] ED on 02/18/2024 from home via EMS with complaints of generalized lower abdominal pain, dysuria and concern from bleeding from vaginal region versus bladder.  Daughter reported onset around 12/1 PM day of admission.  Daughter reports checked her mother's brief and noted it had blood clots, bloody urine soaked into the diaper.  Patient endorsed 1 episode of nausea without vomiting.  No history of UTIs, or dysuria previously.  Not on blood thinners other than aspirin .  No history of vaginal bleeding.  Denies flank pain, no fevers.  In the ED, temperature 98.7 F, HR 85, RR 17, BP 155/80, SpO2 100% on room air.  WBC 15.8, hemoglobin 10.7, platelet count 285.  Sodium 132, potassium 4.3, chloride 95, CO2 24, glucose 208, BUN 34, creatinine 1.5.  Lipase 25.  AST 18, ALT 11, total bilirubin 1.6.  Urinalysis turbid, red in color, many bacteria, greater than 50 WBCs.  Ultrasound pelvis s/p hysterectomy, nonvisualization of the ovaries.  CT abdomen/pelvis without contrast with changes consistent with severe emphysematous cystitis, some dissection of air into the soft tissue surrounding the bladder, dilation of the left collecting system and left ureter to the level of the ureterovesicular junction without evidence of obstructing lesion likely related to underlying changes in the bladder wall, noted dependent density gallbladder consistent with stones versus sludge.  Patient was started on IV ceftriaxone and Zofran .  TRH consulted for further evaluation and management of emphysematous cystitis.  Assessment & Plan:   Emphysematous/hemorrhagic cystitis Patient presenting to ED  with abdominal pain, nausea with reported blood in her diaper.  No previous history of vaginal or GI bleeds.  Not on antiplatelet/anticoagulant other than aspirin .  Hemoglobin 10.7 at time of admission with elevated WBC count of 15.8.  Urinalysis consistent with UTI but difficult to interpret due to hematuria.  Ultrasound pelvis/transvaginal ultrasound notable for hysterectomy and nonvisualized ovaries.  CT abdomen/pelvis without contrast with changes consistent with severe emphysematous cystitis, some dissection of air into the soft tissue surrounding the bladder and dilation of the left collecting system and left ureter to the level of the ureterovesicular junction without evidence of obstructing lesion. -- Urine culture: Pending -- Blood cultures x 2: Pending -- Escalate antibiotics to cefepime -- LR at 75 mL/h -- Place Foley catheter to ensure maximal drainage given emphysematous cystitis -- CBC daily  CKD stage IIIb And an elevated 1.85 on admission.  Etiology likely secondary to prerenal azotemia in the setting of acute cystitis and poor oral intake. -- Cr 1.85>1.70 (Baseline Cr 1.6-1.7) -- Holding home antihypertensives -- LR at 75 mL/h -- Avoid nephrotoxins, renally dose all medications -- BMP daily  Type 2 diabetes mellitus On  Farxiga  at baseline. -- Holding Farxiga  in the setting of active infection -- SSI for coverage -- CBG before every meal/at bedtime  Essential hypertension At baseline on amlodipine  5 mg p.o. daily, furosemide  40 mg p.o. daily, losartan  100 mg p.o. daily, metoprolol  succinate 25 mg p.o. daily. -- BP 135/55 with HR 59 -- Continue to hold antihypertensives for now  -- Hydralazine 10 mg p.o. q6h PRN SBP >165 -- Continue to monitor BP closely  Hyperlipidemia -- Atorvastatin  40 mg p.o.  daily  Iron deficiency anemia -- Ferrous sulfate  325 mg p.o. twice daily  History of Alzheimer's dementia Mood disorder/generalized anxiety disorder Currently not on any  medication outpatient. --Delirium precautions --Get up during the day --Encourage a familiar face to remain present throughout the day --Keep blinds open and lights on during daylight hours --Minimize the use of opioids/benzodiazepines  GERD -- Protonix  40 m p.o. daily  Gout -- Allopurinol  100 mg p.o. daily  DVT prophylaxis: SCDs Start: 02/19/24 0326 Place TED hose Start: 02/19/24 0326    Code Status: Full Code Family Communication:   Disposition Plan:  Level of care: Telemetry Medical Status is: Observation The patient remains OBS appropriate and will d/c before 2 midnights.    Consultants:  none  Procedures:  foley  Antimicrobials:  Ceftriaxone 6/21 - 6/21 Cefepime 6/22>>   Subjective: Patient seen and examined at bedside, lying in bed.  Remains in ED holding area.  Daughter present at bedside.  Patient with no specific complaints this morning.  Discussed imaging findings with patient and daughter that significant urinary tract infection causing hemorrhagic cystitis/emphysematous cystitis.  Will escalate antibiotics to cefepime for broad coverage and place Foley catheter for maximal urinary drainage giving extent of UTI.  Patient with no other Krista Clarke complaints or concerns at this time.  Denies headache, no dizziness, no chest pain, no palpitations, no shortness of breath, no current abdominal pain, no fever/chills/night sweats, no current nausea, no vomiting/diarrhea, no focal weakness, no fatigue, no paresthesias.  No acute events overnight per nursing staff.  Objective: Vitals:   02/19/24 0615 02/19/24 0630 02/19/24 0700 02/19/24 0705  BP: (!) 138/55 (!) 127/46 (!) 135/55   Pulse: 67 67 (!) 59   Resp: 19 (!) 22 17   Temp:    98.7 F (37.1 C)  TempSrc:      SpO2: 100% 100% 100%     Intake/Output Summary (Last 24 hours) at 02/19/2024 1033 Last data filed at 02/19/2024 0630 Gross per 24 hour  Intake 100.33 ml  Output --  Net 100.33 ml   There were no vitals  filed for this visit.  Examination:  Physical Exam: GEN: NAD, alert and oriented, elderly in appearance HEENT: NCAT, PERRL, EOMI, sclera clear, MMM PULM: CTAB w/o wheezes/crackles, normal respiratory effort, on room air CV: RRR w/o M/G/R GI: abd soft, NTND, + BS MSK: no peripheral edema, moves all extremities independently NEURO: No focal neurological deficit PSYCH: normal mood/affect Integumentary: No concerning rashes/lesions/wounds noted on exposed skin surfaces    Data Reviewed: I have personally reviewed following labs and imaging studies  CBC: Recent Labs  Lab 02/19/24 0116 02/19/24 0458  WBC 15.8* 16.4*  NEUTROABS 13.8*  --   HGB 10.7* 10.8*  HCT 34.5* 34.4*  MCV 80.6 80.6  PLT 285 288   Basic Metabolic Panel: Recent Labs  Lab 02/19/24 0116 02/19/24 0458  NA 132* 133*  K 4.3 3.9  CL 95* 99  CO2 24 27  GLUCOSE 208* 181*  BUN 34* 33*  CREATININE 1.85* 1.70*  CALCIUM  8.8* 8.5*   GFR: Estimated Creatinine Clearance: 26.5 mL/min (A) (by C-G formula based on SCr of 1.7 mg/dL (H)). Liver Function Tests: Recent Labs  Lab 02/19/24 0116  AST 18  ALT 11  ALKPHOS 78  BILITOT 1.6*  PROT 7.6  ALBUMIN 3.7   Recent Labs  Lab 02/19/24 0116  LIPASE 25   No results for input(s): AMMONIA in the last 168 hours. Coagulation Profile: No results for input(s): INR, PROTIME  in the last 168 hours. Cardiac Enzymes: No results for input(s): CKTOTAL, CKMB, CKMBINDEX, TROPONINI in the last 168 hours. BNP (last 3 results) No results for input(s): PROBNP in the last 8760 hours. HbA1C: No results for input(s): HGBA1C in the last 72 hours. CBG: Recent Labs  Lab 02/19/24 0005 02/19/24 0807  GLUCAP 175* 155*   Lipid Profile: No results for input(s): CHOL, HDL, LDLCALC, TRIG, CHOLHDL, LDLDIRECT in the last 72 hours. Thyroid  Function Tests: No results for input(s): TSH, T4TOTAL, FREET4, T3FREE, THYROIDAB in the last 72  hours. Anemia Panel: No results for input(s): VITAMINB12, FOLATE, FERRITIN, TIBC, IRON, RETICCTPCT in the last 72 hours. Sepsis Labs: No results for input(s): PROCALCITON, LATICACIDVEN in the last 168 hours.  No results found for this or any previous visit (from the past 240 hours).       Radiology Studies: DG Lumbar Spine Complete Result Date: 02/19/2024 CLINICAL DATA:  Worsening low back pain EXAM: LUMBAR SPINE - COMPLETE 4+ VIEW COMPARISON:  None Available. FINDINGS: Five lumbar type vertebral bodies are well visualized. Vertebral body height is well maintained. Osteophytic changes and disc space narrowing is noted. No pars defects are seen. Facet hypertrophic changes are noted. No soft tissue abnormality is seen. IMPRESSION: Multilevel degenerative change without acute abnormality. Electronically Signed   By: Oneil Devonshire M.D.   On: 02/19/2024 02:38   CT ABDOMEN PELVIS WO CONTRAST Result Date: 02/19/2024 CLINICAL DATA:  Vaginal bleeding, possible hematuria EXAM: CT ABDOMEN AND PELVIS WITHOUT CONTRAST TECHNIQUE: Multidetector CT imaging of the abdomen and pelvis was performed following the standard protocol without IV contrast. RADIATION DOSE REDUCTION: This exam was performed according to the departmental dose-optimization program which includes automated exposure control, adjustment of the mA and/or kV according to patient size and/or use of iterative reconstruction technique. COMPARISON:  Ultrasound from earlier in the same day. FINDINGS: Lower chest: No acute abnormality. Hepatobiliary: Gallbladder is well distended with dependent density which may be related to tiny stones or gallbladder sludge. Liver is within normal limits. Pancreas: Unremarkable. No pancreatic ductal dilatation or surrounding inflammatory changes. Spleen: Normal in size without focal abnormality. Adrenals/Urinary Tract: Adrenal glands are within normal limits. Right kidney shows no renal calculi or  obstructive change. The left kidney demonstrates dilatation of the collecting system with a prominent extrarenal pelvis. The ureter is dilated to the level of the ureterovesical junction although no discrete stone is seen. The bladder is partially distended but demonstrates significant emphysematous changes in the wall consistent with emphysematous cystitis. Some air has dissected along the bladder wall into the adjacent tissues. Stomach/Bowel: The appendix is not well visualized. No inflammatory changes to suggest appendicitis are noted. Stomach and small bowel are unremarkable. Colon shows no significant inflammatory changes. Vascular/Lymphatic: Aortic atherosclerosis. No enlarged abdominal or pelvic lymph nodes. Reproductive: Status post hysterectomy. No adnexal masses. Other: No abdominal wall hernia or abnormality. No abdominopelvic ascites. Musculoskeletal: No acute or significant osseous findings. IMPRESSION: Changes consistent with severe emphysematous cystitis. Some dissection of air into the soft tissue surrounding the bladder is noted as well. Dilatation of the left collecting system and left ureter to the level of the ureterovesical junction without evidence of obstructing lesion. This is likely related to the underlying changes in the bladder wall. Dependent density in the gallbladder as described. Electronically Signed   By: Oneil Devonshire M.D.   On: 02/19/2024 02:36   US  Pelvis Complete Result Date: 02/19/2024 CLINICAL DATA:  Postmenopausal vaginal bleeding EXAM: TRANSABDOMINAL AND TRANSVAGINAL ULTRASOUND OF PELVIS  TECHNIQUE: Both transabdominal and transvaginal ultrasound examinations of the pelvis were performed. Transabdominal technique was performed for global imaging of the pelvis including uterus, ovaries, adnexal regions, and pelvic cul-de-sac. It was necessary to proceed with endovaginal exam following the transabdominal exam to visualize the ovaries. COMPARISON:  None Available. FINDINGS:  Uterus Surgically removed Right ovary Not well visualized Left ovary Not well visualized Other findings No abnormal free fluid. IMPRESSION: Status post hysterectomy. Nonvisualization of the ovaries. Electronically Signed   By: Oneil Devonshire M.D.   On: 02/19/2024 01:01   US  Transvaginal Non-OB Result Date: 02/19/2024 CLINICAL DATA:  Postmenopausal vaginal bleeding EXAM: TRANSABDOMINAL AND TRANSVAGINAL ULTRASOUND OF PELVIS TECHNIQUE: Both transabdominal and transvaginal ultrasound examinations of the pelvis were performed. Transabdominal technique was performed for global imaging of the pelvis including uterus, ovaries, adnexal regions, and pelvic cul-de-sac. It was necessary to proceed with endovaginal exam following the transabdominal exam to visualize the ovaries. COMPARISON:  None Available. FINDINGS: Uterus Surgically removed Right ovary Not well visualized Left ovary Not well visualized Other findings No abnormal free fluid. IMPRESSION: Status post hysterectomy. Nonvisualization of the ovaries. Electronically Signed   By: Oneil Devonshire M.D.   On: 02/19/2024 01:01   VAS US  LOWER EXTREMITY VENOUS (DVT) Result Date: 02/17/2024  Lower Venous DVT Study Patient Name:  KHALANI NOVOA  Date of Exam:   02/17/2024 Medical Rec #: [id number]    Accession #:    [id number] Date of Birth: [birth year]     Patient Gender: F Patient Age:   27 years Exam Location:  [address] Procedure:      VAS US  LOWER EXTREMITY VENOUS (DVT) Referring Phys: LUCIE BUTTNER --------------------------------------------------------------------------------  Indications: Edema, and Pain. Other Indications: Right > left, for 1-2 weeks. Risk Factors: Obesity. Anticoagulation: 81 mg ASA. Comparison Study: 04/04/20 bilateral lower extremity negative for DVT. Performing Technologist: [employer] BS, RVT, RDCS  Examination Guidelines: A complete evaluation includes B-mode imaging, spectral Doppler, color Doppler, and power Doppler as needed of all  accessible portions of each vessel. Bilateral testing is considered an integral part of a complete examination. Limited examinations for reoccurring indications may be performed as noted. The reflux portion of the exam is performed with the patient in reverse Trendelenburg.  +---------+---------------+---------+-----------+----------+--------------+ RIGHT    CompressibilityPhasicitySpontaneityPropertiesThrombus Aging +---------+---------------+---------+-----------+----------+--------------+ CFV      Full           Yes      Yes                                 +---------+---------------+---------+-----------+----------+--------------+ SFJ      Full           Yes      Yes                                 +---------+---------------+---------+-----------+----------+--------------+ FV Prox  Full           Yes      Yes                                 +---------+---------------+---------+-----------+----------+--------------+ FV Mid   Full           Yes      Yes                                 +---------+---------------+---------+-----------+----------+--------------+  FV DistalFull           Yes      Yes                                 +---------+---------------+---------+-----------+----------+--------------+ PFV      Full           Yes      Yes                                 +---------+---------------+---------+-----------+----------+--------------+ POP      Full           Yes      Yes                                 +---------+---------------+---------+-----------+----------+--------------+ PTV      Full           Yes      Yes                                 +---------+---------------+---------+-----------+----------+--------------+ PERO     Full           Yes      Yes                                 +---------+---------------+---------+-----------+----------+--------------+ GSV      Full           Yes      Yes                                  +---------+---------------+---------+-----------+----------+--------------+   +---------+---------------+---------+-----------+----------+--------------+ LEFT     CompressibilityPhasicitySpontaneityPropertiesThrombus Aging +---------+---------------+---------+-----------+----------+--------------+ CFV      Full           Yes      Yes                                 +---------+---------------+---------+-----------+----------+--------------+ SFJ      Full           Yes      Yes                                 +---------+---------------+---------+-----------+----------+--------------+ FV Prox  Full           Yes      Yes                                 +---------+---------------+---------+-----------+----------+--------------+ FV Mid   Full           Yes      Yes                                 +---------+---------------+---------+-----------+----------+--------------+ FV DistalFull           Yes      Yes                                 +---------+---------------+---------+-----------+----------+--------------+  PFV      Full           Yes      Yes                                 +---------+---------------+---------+-----------+----------+--------------+ POP      Full           Yes      Yes                                 +---------+---------------+---------+-----------+----------+--------------+ PTV      Full           Yes      Yes                                 +---------+---------------+---------+-----------+----------+--------------+ PERO     Full           Yes      Yes                                 +---------+---------------+---------+-----------+----------+--------------+ GSV      Full           Yes      Yes                                 +---------+---------------+---------+-----------+----------+--------------+    Findings reported to Routed through Epic due to incorrect phone number. at 3:30 PM.  Summary: BILATERAL: - No evidence  of deep vein thrombosis seen in the lower extremities, bilaterally. - No evidence of superficial venous thrombosis in the lower extremities, bilaterally. -No evidence of popliteal cyst, bilaterally.   *See table(s) above for measurements and observations. Electronically signed by Norman Serve on 02/17/2024 at 4:42:27 PM.    Final         Scheduled Meds:  allopurinol   100 mg Oral q morning   atorvastatin   40 mg Oral QHS   ferrous sulfate   325 mg Oral BID   insulin  aspart  0-5 Units Subcutaneous QHS   insulin  aspart  0-6 Units Subcutaneous TID WC   pantoprazole   40 mg Oral Daily   sodium chloride  flush  3 mL Intravenous Q12H   sodium chloride  flush  3 mL Intravenous Q12H   Continuous Infusions:  sodium chloride      [START ON 02/20/2024] cefTRIAXone (ROCEPHIN)  IV     lactated ringers  100 mL/hr at 02/19/24 0943     LOS: 0 days    Time spent: 52 minutes spent on 02/19/2024 caring for this patient face-to-face including chart review, ordering labs/tests, documenting, discussion with nursing staff, consultants, updating family and interview/physical exam    Camellia PARAS [place], DO [facility] Available via Epic secure chat 7am-7pm After these hours, please refer to coverage provider listed on [email or web address] 02/19/2024, 10:33 AM

## 2024-02-19 NOTE — Care Management (Signed)
 Transition of Care [facility]) - Inpatient Brief Assessment   Patient Details  Name: Krista Clarke MRN: [id number] Date of Birth: Jul 11, 1941  [facility] CM/SW Contact:    Corean JAYSON Canary, RN Phone Number: 02/19/2024, 10:52 AM   Clinical Narrative:  Patient presented with urinary vs vaginal bleeding. Clots when urinating. History of leg edema, DM  Spoke to patient in room, introduced self. Discussed discharge planning. She is alert oriented conversant appropriate. She has a walker at home and would like home health services if needed, she lives with her son, but he is out quite often and is not there 24/7 with work and other outings.. She walks with a walker and states  I do ok/ Son came into room, discussed MOON notice .  PT consult has been placed for recommendations., she has recently had back pain and leg swelling making it more difficult to be independent.   TOC will follow for needs,   Transition of Care Asessment: Insurance and Status: Insurance coverage has been reviewed Patient has primary care physician: Yes Home environment has been reviewed: Lives with son Prior level of function:: With Water engineer Home Services: No current home services Social Drivers of Health Review: SDOH reviewed no interventions necessary Readmission risk has been reviewed: Yes Transition of care needs: transition of care needs identified, TOC will continue to follow

## 2024-02-19 NOTE — ED Notes (Signed)
 Patient's adult brief and incontinent pad changed at this time.  Hematuria noted.  New brief and pads placed.

## 2024-02-20 ENCOUNTER — Ambulatory Visit: Payer: Self-pay | Admitting: Physician Assistant

## 2024-02-20 DIAGNOSIS — E1142 Type 2 diabetes mellitus with diabetic polyneuropathy: Secondary | ICD-10-CM | POA: Diagnosis present

## 2024-02-20 DIAGNOSIS — N3091 Cystitis, unspecified with hematuria: Secondary | ICD-10-CM | POA: Diagnosis not present

## 2024-02-20 DIAGNOSIS — F0283 Dementia in other diseases classified elsewhere, unspecified severity, with mood disturbance: Secondary | ICD-10-CM | POA: Diagnosis present

## 2024-02-20 DIAGNOSIS — E871 Hypo-osmolality and hyponatremia: Secondary | ICD-10-CM | POA: Diagnosis present

## 2024-02-20 DIAGNOSIS — Z1629 Resistance to other single specified antibiotic: Secondary | ICD-10-CM | POA: Diagnosis present

## 2024-02-20 DIAGNOSIS — N1330 Unspecified hydronephrosis: Secondary | ICD-10-CM | POA: Diagnosis not present

## 2024-02-20 DIAGNOSIS — E1122 Type 2 diabetes mellitus with diabetic chronic kidney disease: Secondary | ICD-10-CM | POA: Diagnosis present

## 2024-02-20 DIAGNOSIS — I129 Hypertensive chronic kidney disease with stage 1 through stage 4 chronic kidney disease, or unspecified chronic kidney disease: Secondary | ICD-10-CM | POA: Diagnosis present

## 2024-02-20 DIAGNOSIS — E66812 Obesity, class 2: Secondary | ICD-10-CM | POA: Diagnosis present

## 2024-02-20 DIAGNOSIS — Z950 Presence of cardiac pacemaker: Secondary | ICD-10-CM | POA: Diagnosis not present

## 2024-02-20 DIAGNOSIS — N189 Chronic kidney disease, unspecified: Secondary | ICD-10-CM | POA: Diagnosis not present

## 2024-02-20 DIAGNOSIS — E1165 Type 2 diabetes mellitus with hyperglycemia: Secondary | ICD-10-CM | POA: Diagnosis present

## 2024-02-20 DIAGNOSIS — M109 Gout, unspecified: Secondary | ICD-10-CM | POA: Diagnosis present

## 2024-02-20 DIAGNOSIS — Z6836 Body mass index (BMI) 36.0-36.9, adult: Secondary | ICD-10-CM | POA: Diagnosis not present

## 2024-02-20 DIAGNOSIS — F0284 Dementia in other diseases classified elsewhere, unspecified severity, with anxiety: Secondary | ICD-10-CM | POA: Diagnosis present

## 2024-02-20 DIAGNOSIS — B9620 Unspecified Escherichia coli [E. coli] as the cause of diseases classified elsewhere: Secondary | ICD-10-CM | POA: Diagnosis present

## 2024-02-20 DIAGNOSIS — N1832 Chronic kidney disease, stage 3b: Secondary | ICD-10-CM | POA: Diagnosis present

## 2024-02-20 DIAGNOSIS — N3081 Other cystitis with hematuria: Secondary | ICD-10-CM | POA: Diagnosis present

## 2024-02-20 DIAGNOSIS — Z1611 Resistance to penicillins: Secondary | ICD-10-CM | POA: Diagnosis present

## 2024-02-20 DIAGNOSIS — F411 Generalized anxiety disorder: Secondary | ICD-10-CM | POA: Diagnosis present

## 2024-02-20 DIAGNOSIS — E7800 Pure hypercholesterolemia, unspecified: Secondary | ICD-10-CM | POA: Diagnosis present

## 2024-02-20 DIAGNOSIS — D509 Iron deficiency anemia, unspecified: Secondary | ICD-10-CM | POA: Diagnosis present

## 2024-02-20 DIAGNOSIS — Z79899 Other long term (current) drug therapy: Secondary | ICD-10-CM | POA: Diagnosis not present

## 2024-02-20 DIAGNOSIS — E119 Type 2 diabetes mellitus without complications: Secondary | ICD-10-CM | POA: Diagnosis not present

## 2024-02-20 DIAGNOSIS — Z7984 Long term (current) use of oral hypoglycemic drugs: Secondary | ICD-10-CM | POA: Diagnosis not present

## 2024-02-20 DIAGNOSIS — N179 Acute kidney failure, unspecified: Secondary | ICD-10-CM | POA: Diagnosis not present

## 2024-02-20 DIAGNOSIS — K219 Gastro-esophageal reflux disease without esophagitis: Secondary | ICD-10-CM | POA: Diagnosis present

## 2024-02-20 DIAGNOSIS — G309 Alzheimer's disease, unspecified: Secondary | ICD-10-CM | POA: Diagnosis not present

## 2024-02-20 LAB — BASIC METABOLIC PANEL WITH GFR
Anion gap: 6 (ref 5–15)
BUN: 25 mg/dL — ABNORMAL HIGH (ref 8–23)
CO2: 27 mmol/L (ref 22–32)
Calcium: 8.1 mg/dL — ABNORMAL LOW (ref 8.9–10.3)
Chloride: 102 mmol/L (ref 98–111)
Creatinine, Ser: 1.55 mg/dL — ABNORMAL HIGH (ref 0.44–1.00)
GFR, Estimated: 33 mL/min — ABNORMAL LOW (ref >60)
Glucose, Bld: 128 mg/dL — ABNORMAL HIGH (ref 70–99)
Potassium: 3.7 mmol/L (ref 3.5–5.1)
Sodium: 135 mmol/L (ref 135–145)

## 2024-02-20 LAB — CBC
HCT: 29.6 % — ABNORMAL LOW (ref 36.0–46.0)
Hemoglobin: 9.2 g/dL — ABNORMAL LOW (ref 12.0–15.0)
MCH: 25 pg — ABNORMAL LOW (ref 26.0–34.0)
MCHC: 31.1 g/dL (ref 30.0–36.0)
MCV: 80.4 fL (ref 80.0–100.0)
Platelets: 256 10*3/uL (ref 150–400)
RBC: 3.68 MIL/uL — ABNORMAL LOW (ref 3.87–5.11)
RDW: 15.8 % — ABNORMAL HIGH (ref 11.5–15.5)
WBC: 10.3 10*3/uL (ref 4.0–10.5)
nRBC: 0 % (ref 0.0–0.2)

## 2024-02-20 LAB — GLUCOSE, CAPILLARY
Glucose-Capillary: 134 mg/dL — ABNORMAL HIGH (ref 70–99)
Glucose-Capillary: 220 mg/dL — ABNORMAL HIGH (ref 70–99)
Glucose-Capillary: 181 mg/dL — ABNORMAL HIGH (ref 70–99)
Glucose-Capillary: 250 mg/dL — ABNORMAL HIGH (ref 70–99)

## 2024-02-20 MED ORDER — OXYBUTYNIN CHLORIDE 5 MG PO TABS
5.0000 mg | ORAL_TABLET | Freq: Every evening | ORAL | Status: DC | PRN
  Administered 2024-02-21: 5 mg via ORAL
  Filled 2024-02-20: qty 1

## 2024-02-20 NOTE — Progress Notes (Signed)
 PROGRESS NOTE    Krista Clarke  FMW:[id number] DOB: October 23, 1940 DOA: 02/18/2024 PCP: Jodie Lavern LITTIE, MD    Brief Narrative:   Krista Clarke is a 83 y.o. female with past medical history significant for Alzheimer's dementia, DM2, CHB s/p PPM, anxiety/mood disturbance, HTN, peripheral neuropathy chronic narcotic dependence, who presented to [facility] ED on 02/18/2024 from home via EMS with complaints of generalized lower abdominal pain, dysuria and concern from bleeding from vaginal region versus bladder.  Daughter reported onset around 12/1 PM day of admission.  Daughter reports checked her mother's brief and noted it had blood clots, bloody urine soaked into the diaper.  Patient endorsed 1 episode of nausea without vomiting.  No history of UTIs, or dysuria previously.  Not on blood thinners other than aspirin .  No history of vaginal bleeding.  Denies flank pain, no fevers.  In the ED, temperature 98.7 F, HR 85, RR 17, BP 155/80, SpO2 100% on room air.  WBC 15.8, hemoglobin 10.7, platelet count 285.  Sodium 132, potassium 4.3, chloride 95, CO2 24, glucose 208, BUN 34, creatinine 1.5.  Lipase 25.  AST 18, ALT 11, total bilirubin 1.6.  Urinalysis turbid, red in color, many bacteria, greater than 50 WBCs.  Ultrasound pelvis s/p hysterectomy, nonvisualization of the ovaries.  CT abdomen/pelvis without contrast with changes consistent with severe emphysematous cystitis, some dissection of air into the soft tissue surrounding the bladder, dilation of the left collecting system and left ureter to the level of the ureterovesicular junction without evidence of obstructing lesion likely related to underlying changes in the bladder wall, noted dependent density gallbladder consistent with stones versus sludge.  Patient was started on IV ceftriaxone and Zofran .  TRH consulted for further evaluation and management of emphysematous cystitis.  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  No previous history of vaginal or GI bleeds.  Not on antiplatelet/anticoagulant other than aspirin .  Hemoglobin 10.7 at time of admission with elevated WBC count of 15.8.  Urinalysis consistent with UTI but difficult to interpret due to hematuria.  Ultrasound pelvis/transvaginal ultrasound notable for hysterectomy and nonvisualized ovaries.  CT abdomen/pelvis without contrast with changes consistent with severe emphysematous cystitis, some dissection of air into the soft tissue surrounding the bladder and dilation of the left collecting system and left ureter to the level of the ureterovesicular junction without evidence of obstructing lesion. -- WBC 15.8>16.4>10.3 -- Urine culture: + > 100K GNR; awaiting further identification/susceptibilities -- Cefepime 2 g IV every 24 hours -- LR at 75 mL/h -- Continue Foley catheter to ensure maximal drainage given emphysematous cystitis -- CBC daily  CKD stage IIIb And an elevated 1.85 on admission.  Etiology likely secondary to prerenal azotemia in the setting of acute cystitis and poor oral intake. -- Cr 1.85>1.70>1.55 (Baseline Cr 1.6-1.7) -- Holding home antihypertensives -- LR at 75 mL/h -- Avoid nephrotoxins, renally dose all medications -- BMP daily  Type 2 diabetes mellitus On  Farxiga  at baseline. -- Holding Farxiga  in the setting of active infection -- SSI for coverage -- CBG before every meal/at bedtime  Essential hypertension At baseline on amlodipine  5 mg p.o. daily, furosemide  40 mg p.o. daily, losartan  100 mg p.o. daily, metoprolol  succinate 25 mg p.o. daily. -- BP 124/49 with HR 60 -- Continue to hold antihypertensives for now  -- Hydralazine 10 mg p.o. q6h PRN SBP >165 -- Continue to monitor  BP closely  Hyperlipidemia -- Atorvastatin  40 mg p.o. daily  Iron deficiency anemia -- Ferrous sulfate  325 mg p.o. twice daily  History of  Alzheimer's dementia Mood disorder/generalized anxiety disorder Currently not on any medication outpatient. --Delirium precautions --Get up during the day --Encourage a familiar face to remain present throughout the day --Keep blinds open and lights on during daylight hours --Minimize the use of opioids/benzodiazepines  GERD -- Protonix  40 m p.o. daily  Gout -- Allopurinol  100 mg p.o. daily  DVT prophylaxis: SCDs Start: 02/19/24 0326 Place TED hose Start: 02/19/24 0326    Code Status: Full Code Family Communication: No family present at bedside this morning  Disposition Plan:  Level of care: Telemetry Medical Status is: Observation The patient remains OBS appropriate and will d/c before 2 midnights.    Consultants:  none  Procedures:  Foley 6/22>>  Antimicrobials:  Ceftriaxone 6/21 - 6/21 Cefepime 6/22>>   Subjective: Patient seen and examined at bedside, sitting in bedside chair.  Eating breakfast.  No family present.  Overall feels better today.  Foley catheter noted with blood within collection bag, no clots.  WBC count improved.  Remains on IV antibiotics.  Urine culture growing gram-negative rods, awaiting further identification/susceptibilities.  Patient with no other complaints or concerns, or questions at this time.  Denies headache, no dizziness, no chest pain, no palpitations, no shortness of breath, no current abdominal pain, no fever/chills/night sweats, no current nausea, no vomiting/diarrhea, no focal weakness, no fatigue, no paresthesias.  No acute events overnight per nursing staff.  Objective: Vitals:   02/19/24 1906 02/19/24 2330 02/20/24 0348 02/20/24 0728  BP: (!) 135/49 (!) 102/47 (!) 124/49 (!) 127/56  Pulse: 64 68 60 60  Resp: 18 18 18 18   Temp: 97.6 F (36.4 C) 97.8 F (36.6 C) 97.8 F (36.6 C)   TempSrc:      SpO2: 99% 100% 96% 100%  Weight:        Intake/Output Summary (Last 24 hours) at 02/20/2024 9060 Last data filed at 02/20/2024  0700 Gross per 24 hour  Intake --  Output 1300 ml  Net -1300 ml   Filed Weights   02/19/24 1441  Weight: 93.4 kg    Examination:  Physical Exam: GEN: NAD, alert and oriented, elderly in appearance HEENT: NCAT, PERRL, EOMI, sclera clear, MMM PULM: CTAB w/o wheezes/crackles, normal respiratory effort, on room air CV: RRR w/o M/G/R GI: abd soft, NTND, + BS GU: Foley catheter noted with blood in collection bag, no clots MSK: no peripheral edema, moves all extremities independently NEURO: No focal neurological deficit PSYCH: normal mood/affect Integumentary: No concerning rashes/lesions/wounds noted on exposed skin surfaces    Data Reviewed: I have personally reviewed following labs and imaging studies  CBC: Recent Labs  Lab 02/19/24 0116 02/19/24 0458 02/20/24 0325  WBC 15.8* 16.4* 10.3  NEUTROABS 13.8*  --   --   HGB 10.7* 10.8* 9.2*  HCT 34.5* 34.4* 29.6*  MCV 80.6 80.6 80.4  PLT 285 288 256   Basic Metabolic Panel: Recent Labs  Lab 02/19/24 0116 02/19/24 0458 02/20/24 0325  NA 132* 133* 135  K 4.3 3.9 3.7  CL 95* 99 102  CO2 24 27 27   GLUCOSE 208* 181* 128*  BUN 34* 33* 25*  CREATININE 1.85* 1.70* 1.55*  CALCIUM  8.8* 8.5* 8.1*   GFR: Estimated Creatinine Clearance: 29.3 mL/min (A) (by C-G formula based on SCr of 1.55 mg/dL (H)). Liver Function Tests: Recent Labs  Lab 02/19/24 0116  AST 18  ALT 11  ALKPHOS 78  BILITOT 1.6*  PROT 7.6  ALBUMIN 3.7   Recent Labs  Lab 02/19/24 0116  LIPASE 25   No results for input(s): AMMONIA in the last 168 hours. Coagulation Profile: No results for input(s): INR, PROTIME in the last 168 hours. Cardiac Enzymes: No results for input(s): CKTOTAL, CKMB, CKMBINDEX, TROPONINI in the last 168 hours. BNP (last 3 results) No results for input(s): PROBNP in the last 8760 hours. HbA1C: No results for input(s): HGBA1C in the last 72 hours. CBG: Recent Labs  Lab 02/19/24 0807 02/19/24 1213  02/19/24 1541 02/19/24 2149 02/20/24 0719  GLUCAP 155* 106* 105* 135* 134*   Lipid Profile: No results for input(s): CHOL, HDL, LDLCALC, TRIG, CHOLHDL, LDLDIRECT in the last 72 hours. Thyroid  Function Tests: No results for input(s): TSH, T4TOTAL, FREET4, T3FREE, THYROIDAB in the last 72 hours. Anemia Panel: No results for input(s): VITAMINB12, FOLATE, FERRITIN, TIBC, IRON, RETICCTPCT in the last 72 hours. Sepsis Labs: No results for input(s): PROCALCITON, LATICACIDVEN in the last 168 hours.  Recent Results (from the past 240 hours)  Urine Culture (for pregnant, neutropenic or urologic patients or patients with an indwelling urinary catheter)     Status: Abnormal (Preliminary result)   Collection Time: 02/19/24  1:16 AM   Specimen: Urine, Clean Catch  Result Value Ref Range Status   Specimen Description URINE, CLEAN CATCH  Final   Special Requests NONE  Final   Culture (A)  Final    >=100,000 COLONIES/mL GRAM NEGATIVE RODS IDENTIFICATION TO FOLLOW Performed at [facility], [address] [place], [place] [address]    Report Status PENDING  Incomplete         Radiology Studies: CT HEAD WO CONTRAST ( ) Result Date: 02/19/2024 CLINICAL DATA:  Neurologic deficit.  Possible stroke. EXAM: CT HEAD WITHOUT CONTRAST TECHNIQUE: Contiguous axial images were obtained from the base of the skull through the vertex without intravenous contrast. RADIATION DOSE REDUCTION: This exam was performed according to the departmental dose-optimization program which includes automated exposure control, adjustment of the mA and/or kV according to patient size and/or use of iterative reconstruction technique. COMPARISON:  09/18/2013 FINDINGS: Brain: Ventricles, cisterns and other CSF spaces are normal. There is no mass, mass effect, shift of midline structures or acute hemorrhage. No evidence of acute infarction. Vascular: No hyperdense vessel or unexpected  calcification. Skull: Normal. Negative for fracture or focal lesion. Sinuses/Orbits: No acute finding. Other: None. IMPRESSION: No acute findings. Electronically Signed   By: Toribio Agreste M.D.   On: 02/19/2024 14:09   CT ABDOMEN PELVIS WO CONTRAST Result Date: 02/19/2024 CLINICAL DATA:  Vaginal bleeding, possible hematuria EXAM: CT ABDOMEN AND PELVIS WITHOUT CONTRAST TECHNIQUE: Multidetector CT imaging of the abdomen and pelvis was performed following the standard protocol without IV contrast. RADIATION DOSE REDUCTION: This exam was performed according to the departmental dose-optimization program which includes automated exposure control, adjustment of the mA and/or kV according to patient size and/or use of iterative reconstruction technique. COMPARISON:  Ultrasound from earlier in the same day. FINDINGS: Lower chest: No acute abnormality. Hepatobiliary: Gallbladder is well distended with dependent density which may be related to tiny stones or gallbladder sludge. Liver is within normal limits. Pancreas: Unremarkable. No pancreatic ductal dilatation or surrounding inflammatory changes. Spleen: Normal in size without focal abnormality. Adrenals/Urinary Tract: Adrenal glands are within normal limits. Right kidney shows no renal calculi or obstructive change. The left kidney demonstrates dilatation of the collecting system with  a prominent extrarenal pelvis. The ureter is dilated to the level of the ureterovesical junction although no discrete stone is seen. The bladder is partially distended but demonstrates significant emphysematous changes in the wall consistent with emphysematous cystitis. Some air has dissected along the bladder wall into the adjacent tissues. Stomach/Bowel: The appendix is not well visualized. No inflammatory changes to suggest appendicitis are noted. Stomach and small bowel are unremarkable. Colon shows no significant inflammatory changes. Vascular/Lymphatic: Aortic atherosclerosis. No  enlarged abdominal or pelvic lymph nodes. Reproductive: Status post hysterectomy. No adnexal masses. Other: No abdominal wall hernia or abnormality. No abdominopelvic ascites. Musculoskeletal: No acute or significant osseous findings. IMPRESSION: Changes consistent with severe emphysematous cystitis. Some dissection of air into the soft tissue surrounding the bladder is noted as well. Dilatation of the left collecting system and left ureter to the level of the ureterovesical junction without evidence of obstructing lesion. This is likely related to the underlying changes in the bladder wall. Dependent density in the gallbladder as described. Electronically Signed   By: Oneil Devonshire M.D.   On: 02/19/2024 02:36   US  Pelvis Complete Result Date: 02/19/2024 CLINICAL DATA:  Postmenopausal vaginal bleeding EXAM: TRANSABDOMINAL AND TRANSVAGINAL ULTRASOUND OF PELVIS TECHNIQUE: Both transabdominal and transvaginal ultrasound examinations of the pelvis were performed. Transabdominal technique was performed for global imaging of the pelvis including uterus, ovaries, adnexal regions, and pelvic cul-de-sac. It was necessary to proceed with endovaginal exam following the transabdominal exam to visualize the ovaries. COMPARISON:  None Available. FINDINGS: Uterus Surgically removed Right ovary Not well visualized Left ovary Not well visualized Other findings No abnormal free fluid. IMPRESSION: Status post hysterectomy. Nonvisualization of the ovaries. Electronically Signed   By: Oneil Devonshire M.D.   On: 02/19/2024 01:01   US  Transvaginal Non-OB Result Date: 02/19/2024 CLINICAL DATA:  Postmenopausal vaginal bleeding EXAM: TRANSABDOMINAL AND TRANSVAGINAL ULTRASOUND OF PELVIS TECHNIQUE: Both transabdominal and transvaginal ultrasound examinations of the pelvis were performed. Transabdominal technique was performed for global imaging of the pelvis including uterus, ovaries, adnexal regions, and pelvic cul-de-sac. It was necessary  to proceed with endovaginal exam following the transabdominal exam to visualize the ovaries. COMPARISON:  None Available. FINDINGS: Uterus Surgically removed Right ovary Not well visualized Left ovary Not well visualized Other findings No abnormal free fluid. IMPRESSION: Status post hysterectomy. Nonvisualization of the ovaries. Electronically Signed   By: Oneil Devonshire M.D.   On: 02/19/2024 01:01        Scheduled Meds:  allopurinol   100 mg Oral q morning   atorvastatin   40 mg Oral QHS   Chlorhexidine  Gluconate Cloth  6 each Topical Daily   ferrous sulfate   325 mg Oral BID   insulin  aspart  0-5 Units Subcutaneous QHS   insulin  aspart  0-6 Units Subcutaneous TID WC   pantoprazole   40 mg Oral Daily   sodium chloride  flush  3 mL Intravenous Q12H   sodium chloride  flush  3 mL Intravenous Q12H   Continuous Infusions:  ceFEPime (MAXIPIME) IV Stopped (02/19/24 1448)   lactated ringers  75 mL/hr at 02/19/24 1202     LOS: 0 days    Time spent: 46 minutes spent on 02/20/2024 caring for this patient face-to-face including chart review, ordering labs/tests, documenting, discussion with nursing staff, consultants, updating family and interview/physical exam    Camellia PARAS [place], DO [facility] Available via Epic secure chat 7am-7pm After these hours, please refer to coverage provider listed on [email or web address] 02/20/2024, 9:39 AM

## 2024-02-20 NOTE — Evaluation (Signed)
 Occupational Therapy Evaluation Patient Details Name: Krista Clarke MRN: [id number] DOB: [birth year] Today's Date: 02/20/2024   History of Present Illness   83 yo bib [facility] 02/18/24 from home with complaints of vaginal bleeding, generalized lower abdominal pain, and dysuria. +UTI, hemorrhagic cystitis; AKI; PMH-dementia, DM, anxiety, chronic narcotic dependence, HTN, peripheral neuropathy, s/p pacemaker for CHB, hysterectomy     Clinical Impressions Pt reports ind at baseline with ADLs, uses rollator for mobility, lives alone but reports family assists PRN with meals/transportation. Pt currently needing min-mod A for ADLs, CGA for bed mobility and CGA for transfers with RW. Pt presenting with impairments listed below, will follow acutely. Patient will benefit from continued inpatient follow up therapy, <3 hours/day to maximize safety/ind with ADL/functional mobility.      If plan is discharge home, recommend the following:   A little help with walking and/or transfers;A lot of help with bathing/dressing/bathroom;Assistance with cooking/housework;Direct supervision/assist for medications management;Direct supervision/assist for financial management;Assist for transportation;Help with stairs or ramp for entrance     Functional Status Assessment   Patient has had a recent decline in their functional status and demonstrates the ability to make significant improvements in function in a reasonable and predictable amount of time.     Equipment Recommendations   BSC/3in1     Recommendations for Other Services   PT consult     Precautions/Restrictions   Precautions Precautions: Fall Recall of Precautions/Restrictions: Intact Precaution/Restrictions Comments: denies h/o falls; reports dtr called EMS as she was afraid pt would fall down steps Restrictions Weight Bearing Restrictions Per Provider Order: No     Mobility Bed Mobility Overal bed mobility: Needs Assistance Bed  Mobility: Supine to Sit, Sit to Supine     Supine to sit: Contact guard, Used rails, HOB elevated          Transfers Overall transfer level: Needs assistance Equipment used: Rolling walker (2 wheels) Transfers: Sit to/from Stand Sit to Stand: Contact guard assist                  Balance Overall balance assessment: Needs assistance Sitting-balance support: Bilateral upper extremity supported, Feet unsupported Sitting balance-Leahy Scale: Good     Standing balance support: Bilateral upper extremity supported Standing balance-Leahy Scale: Poor                             ADL either performed or assessed with clinical judgement   ADL Overall ADL's : Needs assistance/impaired Eating/Feeding: Set up;Sitting   Grooming: Minimal assistance;Standing   Upper Body Bathing: Minimal assistance;Standing   Lower Body Bathing: Sit to/from stand;Moderate assistance   Upper Body Dressing : Minimal assistance;Standing   Lower Body Dressing: Moderate assistance;Sitting/lateral leans   Toilet Transfer: Minimal assistance;Ambulation;Rolling walker (2 wheels);Regular Toilet   Toileting- Clothing Manipulation and Hygiene: Minimal assistance       Functional mobility during ADLs: Minimal assistance;Rolling walker (2 wheels)       Vision   Vision Assessment?: No apparent visual deficits     Perception Perception: Not tested       Praxis Praxis: Not tested       Pertinent Vitals/Pain Pain Assessment Pain Assessment: Faces Pain Score: 2  Faces Pain Scale: Hurts a little bit Pain Location: chest Pain Descriptors / Indicators: Grimacing, Guarding Pain Intervention(s): Limited activity within patient's tolerance, Monitored during session, Repositioned     Extremity/Trunk Assessment Upper Extremity Assessment Upper Extremity Assessment: Generalized weakness   Lower Extremity  Assessment Lower Extremity Assessment: Defer to PT evaluation   Cervical /  Trunk Assessment Cervical / Trunk Assessment: Kyphotic   Communication Communication Communication: No apparent difficulties   Cognition Arousal: Alert Behavior During Therapy: WFL for tasks assessed/performed Cognition: No apparent impairments                               Following commands: Intact       Cueing  General Comments   Cueing Techniques: Verbal cues  VSS   Exercises     Shoulder Instructions      Home Living Family/patient expects to be discharged to:: Private residence Living Arrangements: Alone Available Help at Discharge: Family;Available PRN/intermittently (son stays overnight 1x/week) Type of Home: House Home Access: Stairs to enter [employer] of Steps: 4 Entrance Stairs-Rails: None Home Layout: One level     Bathroom Shower/Tub: Tub/shower unit         Home Equipment: Educational psychologist (4 wheels);Rolling Walker (2 wheels);Cane - single point;BSC/3in1          Prior Functioning/Environment Prior Level of Function : Independent/Modified Independent             Mobility Comments: walks with RW; stairs have been getting more difficult lately ADLs Comments: no driving; dtr does grocery shopping, bathes at sink daughter assists, daughter does all meals, daughter organizes meds    OT Problem List: Decreased strength;Decreased range of motion;Decreased activity tolerance;Impaired balance (sitting and/or standing);Impaired vision/perception;Decreased cognition;Decreased safety awareness   OT Treatment/Interventions: Self-care/ADL training;Therapeutic exercise;Energy conservation;DME and/or AE instruction;Therapeutic activities;Patient/family education;Balance training      OT Goals(Current goals can be found in the care plan section)   Acute Rehab OT Goals Patient Stated Goal: none stated OT Goal Formulation: With patient Time For Goal Achievement: 03/05/24 Potential to Achieve Goals: Good ADL Goals Pt  Will Perform Upper Body Dressing: with modified independence;sitting Pt Will Perform Lower Body Dressing: with supervision;sit to/from stand;sitting/lateral leans Pt Will Transfer to Toilet: with modified independence;ambulating;regular height toilet   OT Frequency:  Min 2X/week    Co-evaluation              AM-PAC OT 6 Clicks Daily Activity     Outcome Measure Help from another person eating meals?: A Little Help from another person taking care of personal grooming?: A Little Help from another person toileting, which includes using toliet, bedpan, or urinal?: A Little Help from another person bathing (including washing, rinsing, drying)?: A Lot Help from another person to put on and taking off regular upper body clothing?: A Little Help from another person to put on and taking off regular lower body clothing?: A Lot 6 Click Score: 16   End of Session Equipment Utilized During Treatment: Gait belt;Rolling walker (2 wheels) Nurse Communication: Mobility status  Activity Tolerance: Patient tolerated treatment well Patient left: in chair;with call bell/phone within reach;with chair alarm set  OT Visit Diagnosis: Unsteadiness on feet (R26.81);Other abnormalities of gait and mobility (R26.89);Muscle weakness (generalized) (M62.81)                Time: 9199-9178 OT Time Calculation (min): 21 min Charges:  OT General Charges $OT Visit: 1 Visit OT Evaluation $OT Eval Low Complexity: 1 Low  Laneta POUR, OTD, OTR/L SecureChat Preferred Acute Rehab [phone]   Laneta POUR K[place]ce 02/20/2024, 9:56 AM

## 2024-02-20 NOTE — Telephone Encounter (Signed)
Pt was admitted to the hospital

## 2024-02-20 NOTE — Progress Notes (Signed)
 Mobility Specialist: Progress Note   02/20/24 1329  Mobility  Activity Ambulated with assistance in hallway  Level of Assistance Contact guard assist, steadying assist  Assistive Device Four wheel walker  Distance Ambulated (ft) 100 ft  Activity Response Tolerated well  Mobility Referral Yes  Mobility visit 1 Mobility  Mobility Specialist Start Time (ACUTE ONLY) 0940  Mobility Specialist Stop Time (ACUTE ONLY) 1020  Mobility Specialist Time Calculation (min) (ACUTE ONLY) 40 min    Pt received in chair, agreeable to mobility session. CG throughout. Requested to try rollator. No LOB or unsteadiness noted until closer to the end of session when unsteadiness slightly increased with fatigue. Required mod verbal and tactile cues rollator management. Took 2x seated breaks d/t fatigue and weakness. Returned to room without fault. Left in chair with all needs met, call bell in reach. Chair alarm on.   Ileana Lute Mobility Specialist Please contact via SecureChat or Rehab office at [phone]

## 2024-02-20 NOTE — TOC Initial Note (Signed)
 Transition of Care [facility]) - Initial/Assessment Note    Patient Details  Name: Krista Clarke MRN: [id number] Date of Birth: 01/03/1941  Transition [facility]) CM/SW Contact:    Jeannett Dekoning A [place], LCSW Phone Number: 02/20/2024, 1:06 PM  Clinical Narrative:                  CSW met with pt at bedside. Pt is oriented x2, pleasant but somewhat confused during conversation. Pt was agreeable to referral for SNF.  CSW then followed up with pt's daughter [name]. She said that pt did not want SNF at discharge when she and pt last spoke about it, and would prefer home health. She said that pt lives alone, but daughter and son come visit every day and that she is able to get to the bathroom and eat prepared meals on her own. She also said that pt has equipment; wheelchair, rollator and 3n1 at home. She said when pt is stable for DC she or pt's other family can provide transportation home and possibly stay for a few days to assist pt as needed.    TOC will continue to follow.   Expected Discharge Plan: Home w Home Health Services Barriers to Discharge: Continued Medical Work up   Patient Goals and CMS Choice            Expected Discharge Plan and Services       Living arrangements for the past 2 months: Single Family Home                                      Prior Living Arrangements/Services Living arrangements for the past 2 months: Single Family Home Lives with:: Self                   Activities of Daily Living   ADL Screening (condition at time of admission) Is the patient deaf or have difficulty hearing?: No Does the patient have difficulty concentrating, remembering, or making decisions?: No  Permission Sought/Granted                  Emotional Assessment Appearance:: Appears older than stated age Attitude/Demeanor/Rapport: Engaged Affect (typically observed): Appropriate Orientation: : Oriented to Self, Oriented to Place Alcohol  / Substance Use: Not  Applicable Psych Involvement: No (comment)  Admission diagnosis:  Hemorrhagic cystitis [N30.91] Patient Active Problem List   Diagnosis Date Noted   Hemorrhagic cystitis 02/19/2024   Non-insulin  dependent type 2 diabetes mellitus (HCC) 02/19/2024   Alzheimer disease (HCC) 02/19/2024   Mood disorder (HCC) 02/19/2024   GAD (generalized anxiety disorder) 02/19/2024   Peripheral neuropathy 02/19/2024   Acute kidney injury superimposed on chronic kidney disease (HCC) 02/19/2024   Skin necrosis (HCC) 03/30/2023   Stage 3b chronic kidney disease (HCC) 02/07/2023   Diabetic nephropathy associated with type 2 diabetes mellitus (HCC) 07/21/2022   Complete heart block (HCC) 09/21/2021   Gout 12/03/2020   Chronic venous insufficiency 10/17/2020   Vitamin B12 deficiency 02/06/2020   Lumbar stenosis 10/31/2019   Chronic narcotic dependence (HCC) 07/13/2019   Mild late onset Alzheimer's dementia without behavioral disturbance, psychotic disturbance, mood disturbance, or anxiety (HCC) 09/18/2018   Urinary incontinence in female, followed by GYN, did not tolerate pessary 06/27/2018   LBBB (left bundle branch block) 11/18/2016   PAD (peripheral artery disease) (HCC) 11/18/2016   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HCC) 10/03/2016   Chronic midline low  back pain with bilateral sciatica 10/02/2016   Paresthesia of both lower extremities 10/02/2016   Severe obesity (BMI 35.0-35.9 with comorbidity) (HCC) 10/02/2016   Hyperlipidemia 10/02/2016   Essential hypertension 03/08/2016   Chronic disease anemia 03/08/2016   Type 2 diabetes, controlled, with neuropathy (HCC) 03/08/2016   Pacemaker; 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 [place] [address] Phone: [phone] Fax: [phone]  [facility] - [place], [place] - [address] [place] [address] [place] [address] Phone: [phone] Fax: [phone]     Social Drivers of Health (SDOH) Social History: SDOH Screenings   Food Insecurity: No Food Insecurity (02/20/2024)  Housing: Low Risk  (02/19/2024)  Transportation Needs: No Transportation Needs (02/19/2024)  Utilities: Not At Risk (02/19/2024)  Depression (PHQ2-9): High Risk (06/06/2023)  Financial Resource Strain: Low Risk  (01/18/2023)  Physical Activity: Inactive (03/06/2021)  Social Connections: Moderately Isolated (02/19/2024)  Stress: No Stress Concern Present (03/06/2021)  Tobacco Use: Low Risk  (02/19/2024)   SDOH Interventions:     Readmission Risk Interventions     No data to display

## 2024-02-21 ENCOUNTER — Inpatient Hospital Stay (HOSPITAL_COMMUNITY)

## 2024-02-21 DIAGNOSIS — N3091 Cystitis, unspecified with hematuria: Secondary | ICD-10-CM | POA: Diagnosis not present

## 2024-02-21 LAB — GLUCOSE, CAPILLARY
Glucose-Capillary: 126 mg/dL — ABNORMAL HIGH (ref 70–99)
Glucose-Capillary: 144 mg/dL — ABNORMAL HIGH (ref 70–99)
Glucose-Capillary: 142 mg/dL — ABNORMAL HIGH (ref 70–99)
Glucose-Capillary: 193 mg/dL — ABNORMAL HIGH (ref 70–99)

## 2024-02-21 LAB — CBC
HCT: 29.7 % — ABNORMAL LOW (ref 36.0–46.0)
Hemoglobin: 9.2 g/dL — ABNORMAL LOW (ref 12.0–15.0)
MCH: 24.7 pg — ABNORMAL LOW (ref 26.0–34.0)
MCHC: 31 g/dL (ref 30.0–36.0)
MCV: 79.8 fL — ABNORMAL LOW (ref 80.0–100.0)
Platelets: 236 10*3/uL (ref 150–400)
RBC: 3.72 MIL/uL — ABNORMAL LOW (ref 3.87–5.11)
RDW: 15.7 % — ABNORMAL HIGH (ref 11.5–15.5)
WBC: 8.3 10*3/uL (ref 4.0–10.5)
nRBC: 0 % (ref 0.0–0.2)

## 2024-02-21 LAB — BASIC METABOLIC PANEL WITH GFR
Anion gap: 7 (ref 5–15)
BUN: 20 mg/dL (ref 8–23)
CO2: 26 mmol/L (ref 22–32)
Calcium: 8 mg/dL — ABNORMAL LOW (ref 8.9–10.3)
Chloride: 101 mmol/L (ref 98–111)
Creatinine, Ser: 1.14 mg/dL — ABNORMAL HIGH (ref 0.44–1.00)
GFR, Estimated: 47 mL/min — ABNORMAL LOW (ref >60)
Glucose, Bld: 100 mg/dL — ABNORMAL HIGH (ref 70–99)
Potassium: 3.7 mmol/L (ref 3.5–5.1)
Sodium: 134 mmol/L — ABNORMAL LOW (ref 135–145)

## 2024-02-21 LAB — URINE CULTURE: Culture: >=100000 — AB

## 2024-02-21 MED ORDER — SODIUM CHLORIDE 0.9 % IV SOLN: 2.0000 g | INTRAVENOUS | Status: DC

## 2024-02-21 NOTE — Progress Notes (Signed)
    Durable Medical Equipment  (From admission, onward)           Start     Ordered   02/21/24 1112  For home use only DME 4 wheeled rolling walker with seat  Once       Question Answer Comment  Patient needs a walker to treat with the following condition Hemorrhagic cystitis   Patient needs a walker to treat with the following condition Physical deconditioning      02/21/24 1111

## 2024-02-21 NOTE — TOC Progression Note (Addendum)
 Transition of Care [facility]) - Progression Note    Patient Details  Name: Krista Clarke MRN: [id number] Date of Birth: 05/17/1941  Transition of Care [facility]) CM/SW Contact  Rosaline JONELLE Joe, RN Phone Number: 02/21/2024, 11:40 AM  Clinical Narrative:    CM spoke with [place], MSW and patient/daughter would prefer that patient return home with home health services instead of SNF placement.  Patient lives at home alone but daughter and family check on the patient daily.  DME at the home includes broken RW and bedside commode.  Patient requests rolator.  I provided Medicare choice to the patient regarding DME and home health and patient did not have a preference.  I called [employer] company and requested delivery of rolator to the hospital room today.  I was unable to reach the patient's daughter by phone.  I called [facility] and Jon Rogue - will call back to determine if she can accept for home health or not.  HH orders placed for PT, OT, MSW.  02/21/24 1207 - I was unable to reach [employer].  [employer] was called to deliver rolator to patient's hospital room today.  [facility] accepted for services when patient returns home.   Expected Discharge Plan: Home w Home Health Services Barriers to Discharge: Continued Medical Work up  Expected Discharge Plan and Services       Living arrangements for the past 2 months: Single Family Home                                       Social Determinants of Health (SDOH) Interventions SDOH Screenings   Food Insecurity: No Food Insecurity (02/20/2024)  Housing: Low Risk  (02/19/2024)  Transportation Needs: No Transportation Needs (02/19/2024)  Utilities: Not At Risk (02/19/2024)  Depression (PHQ2-9): High Risk (06/06/2023)  Financial Resource Strain: Low Risk  (01/18/2023)  Physical Activity: Inactive (03/06/2021)  Social Connections: Moderately Isolated (02/19/2024)  Stress: No Stress Concern Present (03/06/2021)  Tobacco Use: Low  Risk  (02/19/2024)    Readmission Risk Interventions    02/21/2024   11:40 AM  Readmission Risk Prevention Plan  Transportation Screening Complete  PCP or Specialist Appt within 5-7 Days Complete  Home Care Screening Complete  Medication Review (RN CM) Complete

## 2024-02-21 NOTE — TOC Progression Note (Signed)
 Transition of Care [facility] - Progression Note    Patient Details  Name: Krista Clarke MRN: [id number] Date of Birth: 1940/11/08  [facility]) CM/SW Contact  Jazzmyne Rasnick A [place], LCSW Phone Number: 02/21/2024, 11:12 AM  Clinical Narrative:     CSW met with pt at bedside, dicussed plan for home with home health versus SNF. CSW told her that [name], pt's daughter had informed CSW of pt's preference for home, which pt confirmed. CSW explained home health, but notified that RNCM was provide details of process and follow up at bedside.  RNCM notified. DC disposition now for home with home health.    TOC will continue to follow.    Expected Discharge Plan: Home w Home Health Services Barriers to Discharge: Continued Medical Work up  Expected Discharge Plan and Services       Living arrangements for the past 2 months: Single Family Home                                       Social Determinants of Health (SDOH) Interventions SDOH Screenings   Food Insecurity: No Food Insecurity (02/20/2024)  Housing: Low Risk  (02/19/2024)  Transportation Needs: No Transportation Needs (02/19/2024)  Utilities: Not At Risk (02/19/2024)  Depression (PHQ2-9): High Risk (06/06/2023)  Financial Resource Strain: Low Risk  (01/18/2023)  Physical Activity: Inactive (03/06/2021)  Social Connections: Moderately Isolated (02/19/2024)  Stress: No Stress Concern Present (03/06/2021)  Tobacco Use: Low Risk  (02/19/2024)    Readmission Risk Interventions     No data to display

## 2024-02-21 NOTE — Progress Notes (Signed)
 Mobility Specialist: Progress Note   02/21/24 1607  Mobility  Activity Ambulated with assistance in hallway  Level of Assistance Contact guard assist, steadying assist  Assistive Device Four wheel walker  Distance Ambulated (ft) 100 ft  Activity Response Tolerated well  Mobility Referral Yes  Mobility visit 1 Mobility  Mobility Specialist Start Time (ACUTE ONLY) 1340  Mobility Specialist Stop Time (ACUTE ONLY) 1355  Mobility Specialist Time Calculation (min) (ACUTE ONLY) 15 min    Pt received in chair, agreeable to mobility session. CG throughout w/ rollator. No complaints. Returned to room without fault. Left in chair with all needs met, call bell in reach.   Ileana Lute Mobility Specialist Please contact via SecureChat or Rehab office at [phone]

## 2024-02-21 NOTE — Progress Notes (Addendum)
 PROGRESS NOTE    Krista Clarke  FMW:[id number] DOB: 1941-03-03 DOA: 02/18/2024 PCP: Jodie Lavern LITTIE, MD    Brief Narrative:   Krista Clarke is a 83 y.o. female with past medical history significant for Alzheimer's dementia, DM2, CHB s/p PPM, anxiety/mood disturbance, HTN, peripheral neuropathy chronic narcotic dependence, who presented to [facility] ED on 02/18/2024 from home via EMS with complaints of generalized lower abdominal pain, dysuria and concern from bleeding from vaginal region versus bladder.  Daughter reported onset around 12/1 PM day of admission.  Daughter reports checked her mother's brief and noted it had blood clots, bloody urine soaked into the diaper.  Patient endorsed 1 episode of nausea without vomiting.  No history of UTIs, or dysuria previously.  Not on blood thinners other than aspirin .  No history of vaginal bleeding.  Denies flank pain, no fevers.  In the ED, temperature 98.7 F, HR 85, RR 17, BP 155/80, SpO2 100% on room air.  WBC 15.8, hemoglobin 10.7, platelet count 285.  Sodium 132, potassium 4.3, chloride 95, CO2 24, glucose 208, BUN 34, creatinine 1.5.  Lipase 25.  AST 18, ALT 11, total bilirubin 1.6.  Urinalysis turbid, red in color, many bacteria, greater than 50 WBCs.  Ultrasound pelvis s/p hysterectomy, nonvisualization of the ovaries.  CT abdomen/pelvis without contrast with changes consistent with severe emphysematous cystitis, some dissection of air into the soft tissue surrounding the bladder, dilation of the left collecting system and left ureter to the level of the ureterovesicular junction without evidence of obstructing lesion likely related to underlying changes in the bladder wall, noted dependent density gallbladder consistent with stones versus sludge.  Patient was started on IV ceftriaxone and Zofran .  TRH consulted for further evaluation and management of emphysematous cystitis.  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  No previous history of vaginal or GI bleeds.  Not on antiplatelet/anticoagulant other than aspirin .  Hemoglobin 10.7 at time of admission with elevated WBC count of 15.8.  Urinalysis consistent with UTI but difficult to interpret due to hematuria.  Ultrasound pelvis/transvaginal ultrasound notable for hysterectomy and nonvisualized ovaries.  CT abdomen/pelvis without contrast with changes consistent with severe emphysematous cystitis, some dissection of air into the soft tissue surrounding the bladder and dilation of the left collecting system and left ureter to the level of the ureterovesicular junction without evidence of obstructing lesion. -- WBC 15.8>16.4>10.3>8.3 -- Urine culture: + E. coli -- De-escalate cefepime to ceftriaxone today in accordance with culture susceptibilities -- LR at 75 mL/h -- Continue Foley catheter to ensure maximal drainage given emphysematous cystitis -- Renal ultrasound today for reevaluation of emphysematous cystitis -- Anticipate likely potential for discharge home tomorrow on oral antibiotics with Foley catheter removal -- CBC daily  CKD stage IIIb And an elevated 1.85 on admission.  Etiology likely secondary to prerenal azotemia in the setting of acute cystitis and poor oral intake. -- Cr 1.85>1.70>1.55>1.14 (Baseline Cr 1.6-1.7) -- Holding home antihypertensives -- Avoid nephrotoxins, renally dose all medications -- BMP daily  Type 2 diabetes mellitus On  Farxiga  at baseline. -- Holding Farxiga  in the setting of active infection -- SSI for coverage -- CBG before every meal/at bedtime  Essential hypertension At baseline on amlodipine  5 mg p.o. daily, furosemide  40 mg p.o. daily, losartan  100 mg p.o. daily, metoprolol  succinate 25 mg p.o. daily. -- BP 118/51 with HR 64 -- Continue to  hold antihypertensives for now; may need to discontinue on discharge given  well-controlled BP while inpatient and off of meds. -- Hydralazine 10 mg p.o. q6h PRN SBP >165 -- Continue to monitor BP closely  Hyperlipidemia -- Atorvastatin  40 mg p.o. daily  Iron deficiency anemia -- Ferrous sulfate  325 mg p.o. twice daily  History of Alzheimer's dementia Mood disorder/generalized anxiety disorder Currently not on any medication outpatient. --Delirium precautions --Get up during the day --Encourage a familiar face to remain present throughout the day --Keep blinds open and lights on during daylight hours --Minimize the use of opioids/benzodiazepines  GERD -- Protonix  40 m p.o. daily  Gout -- Allopurinol  100 mg p.o. daily  Obesity, class I Body mass index is 36.48 kg/m.  DVT prophylaxis: SCDs Start: 02/19/24 0326 Place TED hose Start: 02/19/24 0326    Code Status: Full Code Family Communication: No family present at bedside this morning, attempted to update patient's daughter [name] via telephone, went straight to voicemail, message left.  Disposition Plan:  Level of care: Telemetry Medical Status is: Inpatient Remains inpatient appropriate because: IV antibiotics,  renal ultrasound today, anticipate likely discharge home tomorrow on oral antibiotics with Foley catheter removal      Consultants:  none  Procedures:  Foley 6/22>>  Antimicrobials:  Ceftriaxone 6/21 - 6/21 Cefepime 6/22>>   Subjective: Patient seen and examined at bedside, sitting in bedside chair.  Just finishing breakfast.  No family present.  No complaints this morning.  Urine now clear yellow in collection bag.  Discussed will  If improved anticipate likely discharge home tomorrow with Foley catheter removal and on oral antibiotics.  renal ultrasound for reevaluation of emphysematous cystitis.  Patient with no other complaints or concerns, or questions at this time.  Denies headache, no dizziness, no chest pain, no palpitations, no shortness of breath, no current abdominal  pain, no fever/chills/night sweats, no current nausea, no vomiting/diarrhea, no focal weakness, no fatigue, no paresthesias.  No acute events overnight per nursing staff.  Objective: Vitals:   02/20/24 1643 02/20/24 2031 02/21/24 0357 02/21/24 0722  BP: (!) 149/66 (!) 143/58 (!) 118/51 133/64  Pulse: 77 70 64 70  Resp: 17 17 18 18   Temp:  98.2 F (36.8 C) 97.8 F (36.6 C)   TempSrc:  Oral    SpO2: 100% 100% 99% 96%  Weight:        Intake/Output Summary (Last 24 hours) at 02/21/2024 1020 Last data filed at 02/21/2024 0441 Gross per 24 hour  Intake 2011.25 ml  Output 1675 ml  Net 336.25 ml   Filed Weights   02/19/24 1441  Weight: 93.4 kg    Examination:  Physical Exam: GEN: NAD, alert and oriented, elderly in appearance HEENT: NCAT, PERRL, EOMI, sclera clear, MMM PULM: CTAB w/o wheezes/crackles, normal respiratory effort, on room air CV: RRR w/o M/G/R GI: abd soft, NTND, + BS GU: Foley catheter noted with blood in collection bag, no clots MSK: no peripheral edema, moves all extremities independently NEURO: No focal neurological deficit PSYCH: normal mood/affect Integumentary: No concerning rashes/lesions/wounds noted on exposed skin surfaces    Data Reviewed: I have personally reviewed following labs and imaging studies  CBC: Recent Labs  Lab 02/19/24 0116 02/19/24 0458 02/20/24 0325 02/21/24 0312  WBC 15.8* 16.4* 10.3 8.3  NEUTROABS 13.8*  --   --   --   HGB 10.7* 10.8* 9.2* 9.2*  HCT 34.5* 34.4* 29.6* 29.7*  MCV 80.6 80.6 80.4 79.8*  PLT 285 288 256 236  Basic Metabolic Panel: Recent Labs  Lab 02/19/24 0116 02/19/24 0458 02/20/24 0325 02/21/24 0312  NA 132* 133* 135 134*  K 4.3 3.9 3.7 3.7  CL 95* 99 102 101  CO2 24 27 27 26   GLUCOSE 208* 181* 128* 100*  BUN 34* 33* 25* 20  CREATININE 1.85* 1.70* 1.55* 1.14*  CALCIUM  8.8* 8.5* 8.1* 8.0*   GFR: Estimated Creatinine Clearance: 39.9 mL/min (A) (by C-G formula based on SCr of 1.14 mg/dL  (H)). Liver Function Tests: Recent Labs  Lab 02/19/24 0116  AST 18  ALT 11  ALKPHOS 78  BILITOT 1.6*  PROT 7.6  ALBUMIN 3.7   Recent Labs  Lab 02/19/24 0116  LIPASE 25   No results for input(s): AMMONIA in the last 168 hours. Coagulation Profile: No results for input(s): INR, PROTIME in the last 168 hours. Cardiac Enzymes: No results for input(s): CKTOTAL, CKMB, CKMBINDEX, TROPONINI in the last 168 hours. BNP (last 3 results) No results for input(s): PROBNP in the last 8760 hours. HbA1C: No results for input(s): HGBA1C in the last 72 hours. CBG: Recent Labs  Lab 02/20/24 0719 02/20/24 1211 02/20/24 1641 02/20/24 2103 02/21/24 0719  GLUCAP 134* 220* 181* 250* 126*   Lipid Profile: No results for input(s): CHOL, HDL, LDLCALC, TRIG, CHOLHDL, LDLDIRECT in the last 72 hours. Thyroid  Function Tests: No results for input(s): TSH, T4TOTAL, FREET4, T3FREE, THYROIDAB in the last 72 hours. Anemia Panel: No results for input(s): VITAMINB12, FOLATE, FERRITIN, TIBC, IRON, RETICCTPCT in the last 72 hours. Sepsis Labs: No results for input(s): PROCALCITON, LATICACIDVEN in the last 168 hours.  Recent Results (from the past 240 hours)  Urine Culture (for pregnant, neutropenic or urologic patients or patients with an indwelling urinary catheter)     Status: Abnormal   Collection Time: 02/19/24  1:16 AM   Specimen: Urine, Clean Catch  Result Value Ref Range Status   Specimen Description URINE, CLEAN CATCH  Final   Special Requests   Final    NONE Performed at [facility], [address] [place], [place] [address]    Culture >=100,000 COLONIES/mL ESCHERICHIA COLI (A)  Final   Report Status 02/21/2024 FINAL  Final   Organism ID, Bacteria ESCHERICHIA COLI (A)  Final      Susceptibility   Escherichia coli - MIC*    AMPICILLIN >=32 RESISTANT Resistant     CEFAZOLIN  <=4 SENSITIVE Sensitive     CEFEPIME <=0.12  SENSITIVE Sensitive     CEFTRIAXONE <=0.25 SENSITIVE Sensitive     CIPROFLOXACIN <=0.25 SENSITIVE Sensitive     GENTAMICIN  >=16 RESISTANT Resistant     IMIPENEM <=0.25 SENSITIVE Sensitive     NITROFURANTOIN <=16 SENSITIVE Sensitive     TRIMETH/SULFA >=320 RESISTANT Resistant     AMPICILLIN/SULBACTAM >=32 RESISTANT Resistant     PIP/TAZO <=4 SENSITIVE Sensitive ug/mL    * >=100,000 COLONIES/mL ESCHERICHIA COLI         Radiology Studies: CT HEAD WO CONTRAST ( ) Result Date: 02/19/2024 CLINICAL DATA:  Neurologic deficit.  Possible stroke. EXAM: CT HEAD WITHOUT CONTRAST TECHNIQUE: Contiguous axial images were obtained from the base of the skull through the vertex without intravenous contrast. RADIATION DOSE REDUCTION: This exam was performed according to the departmental dose-optimization program which includes automated exposure control, adjustment of the mA and/or kV according to patient size and/or use of iterative reconstruction technique. COMPARISON:  09/18/2013 FINDINGS: Brain: Ventricles, cisterns and other CSF spaces are normal. There is no mass, mass effect, shift of midline  structures or acute hemorrhage. No evidence of acute infarction. Vascular: No hyperdense vessel or unexpected calcification. Skull: Normal. Negative for fracture or focal lesion. Sinuses/Orbits: No acute finding. Other: None. IMPRESSION: No acute findings. Electronically Signed   By: Toribio Agreste M.D.   On: 02/19/2024 14:09        Scheduled Meds:  allopurinol   100 mg Oral q morning   atorvastatin   40 mg Oral QHS   Chlorhexidine  Gluconate Cloth  6 each Topical Daily   ferrous sulfate   325 mg Oral BID   insulin  aspart  0-5 Units Subcutaneous QHS   insulin  aspart  0-6 Units Subcutaneous TID WC   pantoprazole   40 mg Oral Daily   sodium chloride  flush  3 mL Intravenous Q12H   sodium chloride  flush  3 mL Intravenous Q12H   Continuous Infusions:  ceFEPime (MAXIPIME) IV Stopped (02/20/24 1104)   lactated  ringers  75 mL/hr at 02/21/24 0316     LOS: 1 day    Time spent: 46 minutes spent on 02/21/2024 caring for this patient face-to-face including chart review, ordering labs/tests, documenting, discussion with nursing staff, consultants, updating family and interview/physical exam    Camellia PARAS [place], DO [facility] Available via Epic secure chat 7am-7pm After these hours, please refer to coverage provider listed on [email or web address] 02/21/2024, 10:20 AM

## 2024-02-21 NOTE — Plan of Care (Signed)

## 2024-02-21 NOTE — Progress Notes (Signed)
 Physical Therapy Treatment Patient Details Name: Krista Clarke MRN: [id number] DOB: 10-Nov-1940 Today's Date: 02/21/2024   History of Present Illness 83 yo female adm 02/18/24 from home with bleeding, generalized lower abdominal pain, and dysuria. +UTI, hemorrhagic cystitis; AKI. PMHx:dementia, DM, anxiety, chronic narcotic dependence, HTN, peripheral neuropathy, PPM for CHB, hysterectomy    PT Comments  Pt reports lack of appetite and that family usually bring her a meal a day and she has another delivered by mobile meals but does not prepare food herself. Pt lives alone with 1 recent fall, decreased strength and activity tolerance and baseline dementia. Pt progressing with mobility and note family may decline SNF and if so recommended HHPT, PCA and increased family assist. Patient will benefit from continued inpatient follow up therapy, <3 hours/day and ALF long term unless family can provide increased assist.     If plan is discharge home, recommend the following: A little help with walking and/or transfers;A little help with bathing/dressing/bathroom;Assist for transportation;Help with stairs or ramp for entrance;Supervision due to cognitive status;Assistance with cooking/housework;Direct supervision/assist for financial management;Direct supervision/assist for medications management   Can travel by private vehicle     Yes  Equipment Recommendations  Rollator (4 wheels) (pt reports hers is broken)    Recommendations for Other Services       Precautions / Restrictions Precautions Precautions: Fall Recall of Precautions/Restrictions: Impaired Precaution/Restrictions Comments: reports one recent fall with alert system     Mobility  Bed Mobility Overal bed mobility: Modified Independent Bed Mobility: Supine to Sit           General bed mobility comments: pt able to transfer to sitting with bed flat with use of rail    Transfers Overall transfer level: Needs assistance    Transfers: Sit to/from Stand Sit to Stand: Contact guard assist           General transfer comment: CGA from bed and chair without armrests, cues for hand placement, assist for lines    Ambulation/Gait Ambulation/Gait assistance: Contact guard assist Gait Distance (Feet): 40 Feet Assistive device: Rolling walker (2 wheels) Gait Pattern/deviations: Step-through pattern, Decreased stride length, Trunk flexed   Gait velocity interpretation: <1.8 ft/sec, indicate of risk for recurrent falls   General Gait Details: pt fatigued after 40' requiring chair pulled to her. Cues for posture and proximity to RW. additional 12' after seated rest   Stairs             Wheelchair Mobility     Tilt Bed    Modified Rankin (Stroke Patients Only)       Balance Overall balance assessment: Needs assistance, History of Falls Sitting-balance support: Feet unsupported, No upper extremity supported Sitting balance-Leahy Scale: Fair Sitting balance - Comments: EOB without support   Standing balance support: Bilateral upper extremity supported, Reliant on assistive device for balance, During functional activity Standing balance-Leahy Scale: Poor Standing balance comment: RW in standing                            Communication Communication Communication: No apparent difficulties  Cognition Arousal: Alert Behavior During Therapy: WFL for tasks assessed/performed   PT - Cognitive impairments: History of cognitive impairments                       PT - Cognition Comments: not oriented to day, slow processing, decreased awareness of deficits and limited ability to recognize activity tolerance needing chair  pulled to her Following commands: Intact      Cueing Cueing Techniques: Verbal cues  Exercises      General Comments        Pertinent Vitals/Pain Pain Assessment Pain Score: 3  Pain Location: back Pain Descriptors / Indicators: Aching Pain  Intervention(s): Limited activity within patient's tolerance, Monitored during session, Repositioned    Home Living                          Prior Function            PT Goals (current goals can now be found in the care plan section) Progress towards PT goals: Progressing toward goals    Frequency    Min 2X/week      PT Plan      Co-evaluation              AM-PAC PT 6 Clicks Mobility   Outcome Measure  Help needed turning from your back to your side while in a flat bed without using bedrails?: None Help needed moving from lying on your back to sitting on the side of a flat bed without using bedrails?: None Help needed moving to and from a bed to a chair (including a wheelchair)?: A Little Help needed standing up from a chair using your arms (e.g., wheelchair or bedside chair)?: A Little Help needed to walk in hospital room?: A Little Help needed climbing 3-5 steps with a railing? : A Lot 6 Click Score: 19    End of Session   Activity Tolerance: Patient tolerated treatment well Patient left: in chair;with call bell/phone within reach;with chair alarm set Nurse Communication: Mobility status PT Visit Diagnosis: Muscle weakness (generalized) (M62.81);Difficulty in walking, not elsewhere classified (R26.2)     Time: 0730-0801 PT Time Calculation (min) (ACUTE ONLY): 31 min  Charges:    $Gait Training: 8-22 mins $Therapeutic Activity: 8-22 mins PT General Charges $$ ACUTE PT VISIT: 1 Visit                     Lenoard SQUIBB, PT Acute Rehabilitation Services Office: [phone]    Lenoard NOVAK Vaughn Frieze 02/21/2024, 10:13 AM

## 2024-02-22 ENCOUNTER — Other Ambulatory Visit (HOSPITAL_COMMUNITY): Payer: Self-pay

## 2024-02-22 DIAGNOSIS — E119 Type 2 diabetes mellitus without complications: Secondary | ICD-10-CM | POA: Diagnosis not present

## 2024-02-22 DIAGNOSIS — N179 Acute kidney failure, unspecified: Secondary | ICD-10-CM | POA: Diagnosis not present

## 2024-02-22 DIAGNOSIS — N3091 Cystitis, unspecified with hematuria: Secondary | ICD-10-CM | POA: Diagnosis not present

## 2024-02-22 DIAGNOSIS — G309 Alzheimer's disease, unspecified: Secondary | ICD-10-CM | POA: Diagnosis not present

## 2024-02-22 DIAGNOSIS — F0280 Dementia in other diseases classified elsewhere without behavioral disturbance: Secondary | ICD-10-CM

## 2024-02-22 LAB — BASIC METABOLIC PANEL WITH GFR
Anion gap: 9 (ref 5–15)
BUN: 17 mg/dL (ref 8–23)
CO2: 25 mmol/L (ref 22–32)
Calcium: 8.3 mg/dL — ABNORMAL LOW (ref 8.9–10.3)
Chloride: 100 mmol/L (ref 98–111)
Creatinine, Ser: 1.18 mg/dL — ABNORMAL HIGH (ref 0.44–1.00)
GFR, Estimated: 46 mL/min — ABNORMAL LOW (ref >60)
Glucose, Bld: 142 mg/dL — ABNORMAL HIGH (ref 70–99)
Potassium: 4.5 mmol/L (ref 3.5–5.1)
Sodium: 134 mmol/L — ABNORMAL LOW (ref 135–145)

## 2024-02-22 LAB — CBC
HCT: 29 % — ABNORMAL LOW (ref 36.0–46.0)
Hemoglobin: 8.9 g/dL — ABNORMAL LOW (ref 12.0–15.0)
MCH: 24.7 pg — ABNORMAL LOW (ref 26.0–34.0)
MCHC: 30.7 g/dL (ref 30.0–36.0)
MCV: 80.3 fL (ref 80.0–100.0)
Platelets: 260 10*3/uL (ref 150–400)
RBC: 3.61 MIL/uL — ABNORMAL LOW (ref 3.87–5.11)
RDW: 15.7 % — ABNORMAL HIGH (ref 11.5–15.5)
WBC: 7.6 10*3/uL (ref 4.0–10.5)
nRBC: 0 % (ref 0.0–0.2)

## 2024-02-22 LAB — GLUCOSE, CAPILLARY: Glucose-Capillary: 145 mg/dL — ABNORMAL HIGH (ref 70–99)

## 2024-02-22 MED ORDER — OXYBUTYNIN CHLORIDE 5 MG PO TABS
5.0000 mg | ORAL_TABLET | Freq: Every evening | ORAL | 0 refills | Status: DC | PRN
  Filled 2024-02-22: qty 15, 15d supply, fill #0

## 2024-02-22 MED ORDER — CEPHALEXIN 500 MG PO CAPS
500.0000 mg | ORAL_CAPSULE | Freq: Four times a day (QID) | ORAL | 0 refills | Status: AC
  Filled 2024-02-22: qty 12, 3d supply, fill #0

## 2024-02-22 NOTE — Discharge Summary (Signed)
 Physician Discharge Summary   Patient: Krista Clarke MRN: [id number] DOB: 09/16/1940  Admit date:     02/18/2024  Discharge date: 02/22/24  Discharge Physician: Carliss LELON Canales   PCP: Jodie Lavern LITTIE, MD   Recommendations at discharge:    Pt to be discharged home with home health.   If you experience worsening fever, chills, chest pain, shortness of breath, or other concerning symptoms, please call your PCP or go to the emergency department immediately.  Discharge Diagnoses: Principal Problem:   Hemorrhagic cystitis Active Problems:   Non-insulin  dependent type 2 diabetes mellitus (HCC)   Acute kidney injury superimposed on chronic kidney disease (HCC)   Essential hypertension   Hyperlipidemia   Gout   Diabetic nephropathy associated with type 2 diabetes mellitus (HCC)   Alzheimer disease (HCC)   Mood disorder (HCC)   GAD (generalized anxiety disorder)   Peripheral neuropathy  Resolved Problems:   * No resolved hospital problems. *   Hospital Course:  Krista Clarke is a 83 y.o. female with past medical history significant for Alzheimer's dementia, DM2, CHB s/p PPM, anxiety/mood disturbance, HTN, peripheral neuropathy chronic narcotic dependence, who presented to [facility] ED on 02/18/2024 from home via EMS with complaints of generalized lower abdominal pain, dysuria and concern from bleeding from vaginal region versus bladder.  Daughter reported onset around 12/1 PM day of admission.  Daughter reports checked her mother's brief and noted it had blood clots, bloody urine soaked into the diaper.  Patient endorsed 1 episode of nausea without vomiting.  No history of UTIs, or dysuria previously.  Not on blood thinners other than aspirin .  No history of vaginal bleeding.  Denies flank pain, no fevers.   In the ED, temperature 98.7 F, HR 85, RR 17, BP 155/80, SpO2 100% on room air.  WBC 15.8, hemoglobin 10.7, platelet count 285.  Sodium 132, potassium 4.3, chloride 95, CO2 24, glucose 208, BUN 34,  creatinine 1.5.  Lipase 25.  AST 18, ALT 11, total bilirubin 1.6.  Urinalysis turbid, red in color, many bacteria, greater than 50 WBCs.  Ultrasound pelvis s/p hysterectomy, nonvisualization of the ovaries.  CT abdomen/pelvis without contrast with changes consistent with severe emphysematous cystitis, some dissection of air into the soft tissue surrounding the bladder, dilation of the left collecting system and left ureter to the level of the ureterovesicular junction without evidence of obstructing lesion likely related to underlying changes in the bladder wall, noted dependent density gallbladder consistent with stones versus sludge.  Patient was started on IV ceftriaxone and Zofran .  TRH consulted for further evaluation and management of emphysematous cystitis.  Assessment and Plan:  Emphysematous/hemorrhagic cystitis - Reported hematuria on presentation.  Foley catheter placed.  UA consistent with UTI.  Noted E. coli with resistance to ampicillin, Bactrim.  Responded well to empiric ceftriaxone.  Leukocytosis resolved.  hematuria resolved.  Will transition patient to p.o. Keflex  to take for the next 3 days upon discharge.  Acute kidney injury on CKD 3B - Likely prerenal etiology.  Showed resolution after IV fluid hydration.  Diabetes mellitus - Resume home medication regiment.  Alzheimer's dementia/mood disorder/anxiety disorder - Continue home medication regimen.  GERD - Protonix    Consultants: None Procedures performed: None Disposition: Home health Diet recommendation:  Discharge Diet Orders (From admission, onward)     Start     Ordered   02/22/24 0000  Diet - low sodium heart healthy        02/22/24 1002  Cardiac and Carb modified diet  DISCHARGE MEDICATION: Allergies as of 02/22/2024       Reactions   Prednisone Other (See Comments), Palpitations, Rash   Doesn't work well for patient        Medication List     TAKE these medications    allopurinol   100 MG tablet Commonly known as: ZYLOPRIM  TAKE 1 TABLET BY MOUTH EVERY MORNING   amLODipine  5 MG tablet Commonly known as: NORVASC  TAKE 1 TABLET BY MOUTH EVERY DAY   ascorbic acid  500 MG tablet Commonly known as: VITAMIN C  TAKE 1 TABLET BY MOUTH EVERY DAY   Aspirin  Low Dose 81 MG chewable tablet Generic drug: aspirin  CHEW ONE TABLET IN MOUTH EVERY DAY   atorvastatin  40 MG tablet Commonly known as: LIPITOR Take 1 tablet (40 mg total) by mouth at bedtime.   cephALEXin  500 MG capsule Commonly known as: KEFLEX  Take 1 capsule (500 mg total) by mouth 4 (four) times daily for 3 days.   cyanocobalamin  1000 MCG tablet Commonly known as: VITAMIN B12 TAKE 1 TABLET BY MOUTH EVERY DAY   dapagliflozin  propanediol 10 MG Tabs tablet Commonly known as: Farxiga  Take 1 tablet (10 mg total) by mouth daily.   diclofenac  Sodium 1 % Gel Commonly known as: VOLTAREN  Apply 4 GRAMS topically 4 TIMES daily AS NEEDED FOR PAIN   FeroSul 325 (65 Fe) MG tablet Generic drug: ferrous sulfate  TAKE 1 TABLET BY MOUTH 2 TIMES DAILY   furosemide  40 MG tablet Commonly known as: LASIX  TAKE 1 TABLET BY MOUTH EVERY DAY   HYDROcodone -acetaminophen  5-325 MG tablet Commonly known as: NORCO/VICODIN TAKE 1 TABLET BY MOUTH 2 TIMES DAILY   losartan  100 MG tablet Commonly known as: COZAAR  TAKE 1 TABLET BY MOUTH EVERY DAY   Lutein  20 MG Caps TAKE 1 CAPSULE BY MOUTH EVERY DAY   magnesium  oxide 400 (240 Mg) MG tablet Commonly known as: MAG-OX TAKE 1 TABLET BY MOUTH ONCE DAILY   meclizine  12.5 MG tablet Commonly known as: ANTIVERT  Take 1 tablet (12.5 mg total) by mouth 2 (two) times daily as needed for dizziness.   metoprolol  succinate 25 MG 24 hr tablet Commonly known as: TOPROL -XL TAKE 1 TABLET BY MOUTH EVERY DAY   omeprazole  20 MG capsule Commonly known as: PRILOSEC TAKE 1 CAPSULE BY MOUTH EVERY DAY BEFORE BREAKFAST   oxybutynin 5 MG tablet Commonly known as: DITROPAN Take 1 tablet (5 mg total)  by mouth at bedtime as needed for bladder spasms.   triamcinolone  cream 0.1 % Commonly known as: KENALOG  Apply 1 application topically 2 (two) times daily. For 2 weeks, then as needed               Durable Medical Equipment  (From admission, onward)           Start     Ordered   02/21/24 1112  For home use only DME 4 wheeled rolling walker with seat  Once       Question Answer Comment  Patient needs a walker to treat with the following condition Hemorrhagic cystitis   Patient needs a walker to treat with the following condition Physical deconditioning      02/21/24 1111            Follow-up Information     [facility], [place] Follow up.   Why: [facility] will provide home health services.  They will call you in the next 24-48 hours to set up services.  Contact information: [address] [place] [address] [phone]         Care, [facility] Follow up.   Why: [employer] will provide a rolator for home. Contact information: [address] [place] [address] [phone]                 Discharge Exam: Filed Weights   02/19/24 1441  Weight: 93.4 kg    GENERAL:  Alert, pleasant, no acute distress  HEENT:  EOMI CARDIOVASCULAR:  RRR, no murmurs appreciated RESPIRATORY:  Clear to auscultation, no wheezing, rales, or rhonchi GASTROINTESTINAL:  Soft, nontender, nondistended EXTREMITIES:  No LE edema bilaterally NEURO:  No new focal deficits appreciated SKIN:  No rashes noted PSYCH:  Appropriate mood and affect     Condition at discharge: improving  The results of significant diagnostics from this hospitalization (including imaging, microbiology, ancillary and laboratory) are listed below for reference.   Imaging Studies: US  RENAL Result Date: 02/21/2024 CLINICAL DATA:  Emphysematous cystitis EXAM: RENAL / URINARY TRACT ULTRASOUND COMPLETE COMPARISON:  CT abdomen and pelvis  02/19/2024. FINDINGS: Right Kidney: Renal measurements: 8.8 x 4.9 x 4.3 cm = volume: 96 mL. Echogenicity is within normal limits. There is mild hydronephrosis. No focal mass. Left Kidney: Renal measurements: 11.3 x 5.4 x 4.4 cm = volume: 140 mL. Echogenicity is within normal limits. There is mild hydronephrosis. No focal mass. Bladder: Foley catheter visualized. Other: None. IMPRESSION: Mild bilateral hydronephrosis. Electronically Signed   By: Greig Pique M.D.   On: 02/21/2024 22:23   CT HEAD WO CONTRAST ( ) Result Date: 02/19/2024 CLINICAL DATA:  Neurologic deficit.  Possible stroke. EXAM: CT HEAD WITHOUT CONTRAST TECHNIQUE: Contiguous axial images were obtained from the base of the skull through the vertex without intravenous contrast. RADIATION DOSE REDUCTION: This exam was performed according to the departmental dose-optimization program which includes automated exposure control, adjustment of the mA and/or kV according to patient size and/or use of iterative reconstruction technique. COMPARISON:  09/18/2013 FINDINGS: Brain: Ventricles, cisterns and other CSF spaces are normal. There is no mass, mass effect, shift of midline structures or acute hemorrhage. No evidence of acute infarction. Vascular: No hyperdense vessel or unexpected calcification. Skull: Normal. Negative for fracture or focal lesion. Sinuses/Orbits: No acute finding. Other: None. IMPRESSION: No acute findings. Electronically Signed   By: Toribio Agreste M.D.   On: 02/19/2024 14:09   DG Lumbar Spine Complete Result Date: 02/19/2024 CLINICAL DATA:  Worsening low back pain EXAM: LUMBAR SPINE - COMPLETE 4+ VIEW COMPARISON:  None Available. FINDINGS: Five lumbar type vertebral bodies are well visualized. Vertebral body height is well maintained. Osteophytic changes and disc space narrowing is noted. No pars defects are seen. Facet hypertrophic changes are noted. No soft tissue abnormality is seen. IMPRESSION: Multilevel degenerative change  without acute abnormality. Electronically Signed   By: Oneil Devonshire M.D.   On: 02/19/2024 02:38   CT ABDOMEN PELVIS WO CONTRAST Result Date: 02/19/2024 CLINICAL DATA:  Vaginal bleeding, possible hematuria EXAM: CT ABDOMEN AND PELVIS WITHOUT CONTRAST TECHNIQUE: Multidetector CT imaging of the abdomen and pelvis was performed following the standard protocol without IV contrast. RADIATION DOSE REDUCTION: This exam was performed according to the departmental dose-optimization program which includes automated exposure control, adjustment of the mA and/or kV according to patient size and/or use of iterative reconstruction technique. COMPARISON:  Ultrasound from earlier in the same day. FINDINGS: Lower chest: No acute abnormality. Hepatobiliary: Gallbladder is well distended with dependent density which may  be related to tiny stones or gallbladder sludge. Liver is within normal limits. Pancreas: Unremarkable. No pancreatic ductal dilatation or surrounding inflammatory changes. Spleen: Normal in size without focal abnormality. Adrenals/Urinary Tract: Adrenal glands are within normal limits. Right kidney shows no renal calculi or obstructive change. The left kidney demonstrates dilatation of the collecting system with a prominent extrarenal pelvis. The ureter is dilated to the level of the ureterovesical junction although no discrete stone is seen. The bladder is partially distended but demonstrates significant emphysematous changes in the wall consistent with emphysematous cystitis. Some air has dissected along the bladder wall into the adjacent tissues. Stomach/Bowel: The appendix is not well visualized. No inflammatory changes to suggest appendicitis are noted. Stomach and small bowel are unremarkable. Colon shows no significant inflammatory changes. Vascular/Lymphatic: Aortic atherosclerosis. No enlarged abdominal or pelvic lymph nodes. Reproductive: Status post hysterectomy. No adnexal masses. Other: No abdominal  wall hernia or abnormality. No abdominopelvic ascites. Musculoskeletal: No acute or significant osseous findings. IMPRESSION: Changes consistent with severe emphysematous cystitis. Some dissection of air into the soft tissue surrounding the bladder is noted as well. Dilatation of the left collecting system and left ureter to the level of the ureterovesical junction without evidence of obstructing lesion. This is likely related to the underlying changes in the bladder wall. Dependent density in the gallbladder as described. Electronically Signed   By: Oneil Devonshire M.D.   On: 02/19/2024 02:36   US  Pelvis Complete Result Date: 02/19/2024 CLINICAL DATA:  Postmenopausal vaginal bleeding EXAM: TRANSABDOMINAL AND TRANSVAGINAL ULTRASOUND OF PELVIS TECHNIQUE: Both transabdominal and transvaginal ultrasound examinations of the pelvis were performed. Transabdominal technique was performed for global imaging of the pelvis including uterus, ovaries, adnexal regions, and pelvic cul-de-sac. It was necessary to proceed with endovaginal exam following the transabdominal exam to visualize the ovaries. COMPARISON:  None Available. FINDINGS: Uterus Surgically removed Right ovary Not well visualized Left ovary Not well visualized Other findings No abnormal free fluid. IMPRESSION: Status post hysterectomy. Nonvisualization of the ovaries. Electronically Signed   By: Oneil Devonshire M.D.   On: 02/19/2024 01:01   US  Transvaginal Non-OB Result Date: 02/19/2024 CLINICAL DATA:  Postmenopausal vaginal bleeding EXAM: TRANSABDOMINAL AND TRANSVAGINAL ULTRASOUND OF PELVIS TECHNIQUE: Both transabdominal and transvaginal ultrasound examinations of the pelvis were performed. Transabdominal technique was performed for global imaging of the pelvis including uterus, ovaries, adnexal regions, and pelvic cul-de-sac. It was necessary to proceed with endovaginal exam following the transabdominal exam to visualize the ovaries. COMPARISON:  None Available.  FINDINGS: Uterus Surgically removed Right ovary Not well visualized Left ovary Not well visualized Other findings No abnormal free fluid. IMPRESSION: Status post hysterectomy. Nonvisualization of the ovaries. Electronically Signed   By: Oneil Devonshire M.D.   On: 02/19/2024 01:01   VAS US  LOWER EXTREMITY VENOUS (DVT) Result Date: 02/17/2024  Lower Venous DVT Study Patient Name:  GRATIA DISLA  Date of Exam:   02/17/2024 Medical Rec #: [id number]    Accession #:    [id number] Date of Birth: 12-May-1941     Patient Gender: F Patient Age:   57 years Exam Location:  [address] Procedure:      VAS US  LOWER EXTREMITY VENOUS (DVT) Referring Phys: LUCIE BUTTNER --------------------------------------------------------------------------------  Indications: Edema, and Pain. Other Indications: Right > left, for 1-2 weeks. Risk Factors: Obesity. Anticoagulation: 81 mg ASA. Comparison Study: 04/04/20 bilateral lower extremity negative for DVT. Performing Technologist: [employer] BS, RVT, RDCS  Examination Guidelines: A complete evaluation includes B-mode imaging, spectral Doppler,  color Doppler, and power Doppler as needed of all accessible portions of each vessel. Bilateral testing is considered an integral part of a complete examination. Limited examinations for reoccurring indications may be performed as noted. The reflux portion of the exam is performed with the patient in reverse Trendelenburg.  +---------+---------------+---------+-----------+----------+--------------+ RIGHT    CompressibilityPhasicitySpontaneityPropertiesThrombus Aging +---------+---------------+---------+-----------+----------+--------------+ CFV      Full           Yes      Yes                                 +---------+---------------+---------+-----------+----------+--------------+ SFJ      Full           Yes      Yes                                 +---------+---------------+---------+-----------+----------+--------------+ FV  Prox  Full           Yes      Yes                                 +---------+---------------+---------+-----------+----------+--------------+ FV Mid   Full           Yes      Yes                                 +---------+---------------+---------+-----------+----------+--------------+ FV DistalFull           Yes      Yes                                 +---------+---------------+---------+-----------+----------+--------------+ PFV      Full           Yes      Yes                                 +---------+---------------+---------+-----------+----------+--------------+ POP      Full           Yes      Yes                                 +---------+---------------+---------+-----------+----------+--------------+ PTV      Full           Yes      Yes                                 +---------+---------------+---------+-----------+----------+--------------+ PERO     Full           Yes      Yes                                 +---------+---------------+---------+-----------+----------+--------------+ GSV      Full           Yes      Yes                                 +---------+---------------+---------+-----------+----------+--------------+   +---------+---------------+---------+-----------+----------+--------------+  LEFT     CompressibilityPhasicitySpontaneityPropertiesThrombus Aging +---------+---------------+---------+-----------+----------+--------------+ CFV      Full           Yes      Yes                                 +---------+---------------+---------+-----------+----------+--------------+ SFJ      Full           Yes      Yes                                 +---------+---------------+---------+-----------+----------+--------------+ FV Prox  Full           Yes      Yes                                 +---------+---------------+---------+-----------+----------+--------------+ FV Mid   Full           Yes      Yes                                  +---------+---------------+---------+-----------+----------+--------------+ FV DistalFull           Yes      Yes                                 +---------+---------------+---------+-----------+----------+--------------+ PFV      Full           Yes      Yes                                 +---------+---------------+---------+-----------+----------+--------------+ POP      Full           Yes      Yes                                 +---------+---------------+---------+-----------+----------+--------------+ PTV      Full           Yes      Yes                                 +---------+---------------+---------+-----------+----------+--------------+ PERO     Full           Yes      Yes                                 +---------+---------------+---------+-----------+----------+--------------+ GSV      Full           Yes      Yes                                 +---------+---------------+---------+-----------+----------+--------------+    Findings reported to Routed through Epic due to incorrect phone number. at 3:30 PM.  Summary: BILATERAL: - No evidence of deep vein thrombosis seen in the lower extremities,  bilaterally. - No evidence of superficial venous thrombosis in the lower extremities, bilaterally. -No evidence of popliteal cyst, bilaterally.   *See table(s) above for measurements and observations. Electronically signed by Norman Serve on 02/17/2024 at 4:42:27 PM.    Final    CUP PACEART REMOTE DEVICE CHECK Result Date: 02/08/2024 PPM Scheduled remote reviewed. Normal device function.  Presenting rhythm: APVP Next remote 91 days. [place], CVRS   Microbiology: Results for orders placed or performed during the hospital encounter of 02/18/24  Urine Culture (for pregnant, neutropenic or urologic patients or patients with an indwelling urinary catheter)     Status: Abnormal   Collection Time: 02/19/24  1:16 AM   Specimen: Urine, Clean Catch   Result Value Ref Range Status   Specimen Description URINE, CLEAN CATCH  Final   Special Requests   Final    NONE Performed at [facility], [address] [place], [place] [address]    Culture >=100,000 COLONIES/mL ESCHERICHIA COLI (A)  Final   Report Status 02/21/2024 FINAL  Final   Organism ID, Bacteria ESCHERICHIA COLI (A)  Final      Susceptibility   Escherichia coli - MIC*    AMPICILLIN >=32 RESISTANT Resistant     CEFAZOLIN  <=4 SENSITIVE Sensitive     CEFEPIME <=0.12 SENSITIVE Sensitive     CEFTRIAXONE <=0.25 SENSITIVE Sensitive     CIPROFLOXACIN <=0.25 SENSITIVE Sensitive     GENTAMICIN  >=16 RESISTANT Resistant     IMIPENEM <=0.25 SENSITIVE Sensitive     NITROFURANTOIN <=16 SENSITIVE Sensitive     TRIMETH/SULFA >=320 RESISTANT Resistant     AMPICILLIN/SULBACTAM >=32 RESISTANT Resistant     PIP/TAZO <=4 SENSITIVE Sensitive ug/mL    * >=100,000 COLONIES/mL ESCHERICHIA COLI    Labs: CBC: Recent Labs  Lab 02/19/24 0116 02/19/24 0458 02/20/24 0325 02/21/24 0312 02/22/24 0202  WBC 15.8* 16.4* 10.3 8.3 7.6  NEUTROABS 13.8*  --   --   --   --   HGB 10.7* 10.8* 9.2* 9.2* 8.9*  HCT 34.5* 34.4* 29.6* 29.7* 29.0*  MCV 80.6 80.6 80.4 79.8* 80.3  PLT 285 288 256 236 260   Basic Metabolic Panel: Recent Labs  Lab 02/19/24 0116 02/19/24 0458 02/20/24 0325 02/21/24 0312 02/22/24 0202  NA 132* 133* 135 134* 134*  K 4.3 3.9 3.7 3.7 4.5  CL 95* 99 102 101 100  CO2 24 27 27 26 25   GLUCOSE 208* 181* 128* 100* 142*  BUN 34* 33* 25* 20 17  CREATININE 1.85* 1.70* 1.55* 1.14* 1.18*  CALCIUM  8.8* 8.5* 8.1* 8.0* 8.3*   Liver Function Tests: Recent Labs  Lab 02/19/24 0116  AST 18  ALT 11  ALKPHOS 78  BILITOT 1.6*  PROT 7.6  ALBUMIN 3.7   CBG: Recent Labs  Lab 02/21/24 0719 02/21/24 1208 02/21/24 1657 02/21/24 2017 02/22/24 0728  GLUCAP 126* 144* 142* 193* 145*    Discharge time spent: 25 minutes.  Length of inpatient stay: 2 days  Signed: Carliss LELON Canales, DO [facility] 02/22/2024

## 2024-02-22 NOTE — Plan of Care (Signed)

## 2024-02-23 ENCOUNTER — Telehealth: Payer: Self-pay | Admitting: *Deleted

## 2024-02-23 DIAGNOSIS — N3080 Other cystitis without hematuria: Secondary | ICD-10-CM | POA: Diagnosis not present

## 2024-02-23 DIAGNOSIS — E1122 Type 2 diabetes mellitus with diabetic chronic kidney disease: Secondary | ICD-10-CM | POA: Diagnosis not present

## 2024-02-23 DIAGNOSIS — E1142 Type 2 diabetes mellitus with diabetic polyneuropathy: Secondary | ICD-10-CM | POA: Diagnosis not present

## 2024-02-23 DIAGNOSIS — N179 Acute kidney failure, unspecified: Secondary | ICD-10-CM | POA: Diagnosis not present

## 2024-02-23 DIAGNOSIS — N1832 Chronic kidney disease, stage 3b: Secondary | ICD-10-CM | POA: Diagnosis not present

## 2024-02-23 NOTE — Transitions of Care (Post Inpatient/ED Visit) (Signed)
   02/23/2024  Name: Krista Clarke MRN: [id number] DOB: Dec 09, 1940  Today's TOC FU Call Status: Today's TOC FU Call Status:: Unsuccessful Call (1st Attempt) Unsuccessful Call (1st Attempt) Date: 02/23/24  Attempted to reach the patient regarding the most recent Inpatient/ED visit.  Follow Up Plan: Additional outreach attempts will be made to reach the patient to complete the Transitions of Care (Post Inpatient/ED visit) call.   [name] [facility], BSN RN Care Manager/ Transition of Care / [facility] [phone]

## 2024-02-24 ENCOUNTER — Telehealth: Payer: Self-pay

## 2024-02-24 ENCOUNTER — Telehealth: Payer: Self-pay | Admitting: *Deleted

## 2024-02-24 NOTE — Telephone Encounter (Signed)
 Copied from CRM [id number]. Topic: Clinical - Home Health Verbal Orders >> Feb 23, 2024  4:35 PM Chiquita SQUIBB wrote: Caller/Agency: [facility] Callback Number: [phone] Service Requested: Physical Therapy Frequency: 1 week 1, 2 week 3, 1 week 3 Any new concerns about the patient? No  Spoke with [facility] for them to move forward with verbal orders that they are requesting

## 2024-02-24 NOTE — Transitions of Care (Post Inpatient/ED Visit) (Signed)
 02/24/2024  Name: Krista Clarke MRN: [id number] DOB: Dec 22, 1940  Today's TOC FU Call Status: Today's TOC FU Call Status:: Successful TOC FU Call Completed TOC FU Call Complete Date: 02/24/24 Patient's Name and Date of Birth confirmed.  Transition Care Management Follow-up Telephone Call Date of Discharge: 02/22/24 Discharge Facility: [facility]) Type of Discharge: Inpatient Admission Primary Inpatient Discharge Diagnosis:: Hemorrhagic cystitis How have you been since you were released from the hospital?:  (appetite is improving, has chronic constipation, uses laxative, stool softener as needed, drinking adequate fluids, no issues with bladder/ urination, ambulating with walker) Any questions or concerns?: No  Items Reviewed: Did you receive and understand the discharge instructions provided?: Yes Medications obtained,verified, and reconciled?: Yes (Medications Reviewed) Any new allergies since your discharge?: No Dietary orders reviewed?: Yes Type of Diet Ordered:: heart healthy,  low sodium   carb modified Do you have support at home?: Yes People in Home [RPT]: child(ren), adult Name of Support/Comfort Primary Source: adult daughter works but checks in on pt  Medications Reviewed Today: Medications Reviewed Today     Reviewed by Aura Mliss LABOR, RN (Registered Nurse) on 02/24/24 at 1355  Med List Status: <None>   Medication Order Taking? Sig Documenting Provider Last Dose Status Informant  allopurinol  (ZYLOPRIM ) 100 MG tablet [id number] Yes TAKE 1 TABLET BY MOUTH EVERY MORNING Jodie Lavern LITTIE, MD  Active Child, Pharmacy Records  amLODipine  (NORVASC ) 5 MG tablet [id number] Yes TAKE 1 TABLET BY MOUTH EVERY DAY Jodie Lavern LITTIE, MD  Active Child, Pharmacy Records  ascorbic acid  (VITAMIN C ) 500 MG tablet [id number] Yes TAKE 1 TABLET BY MOUTH EVERY DAY Jodie Lavern LITTIE, MD  Active Child, Pharmacy Records  ASPIRIN  LOW DOSE 81 MG chewable tablet [id number] Yes CHEW ONE TABLET IN MOUTH EVERY  DAY Jodie Lavern LITTIE, MD  Active Child, Pharmacy Records  atorvastatin  (LIPITOR) 40 MG tablet [id number] Yes Take 1 tablet (40 mg total) by mouth at bedtime. Jodie Lavern LITTIE, MD  Active Child, Pharmacy Records  cephALEXin  (KEFLEX ) 500 MG capsule [id number] Yes Take 1 capsule (500 mg total) by mouth 4 (four) times daily for 3 days. Arlon Carliss ORN, DO  Active   cyanocobalamin  (VITAMIN B12) 1000 MCG tablet [id number] Yes TAKE 1 TABLET BY MOUTH EVERY DAY Jodie Lavern LITTIE, MD  Active Child, Pharmacy Records  dapagliflozin  propanediol (FARXIGA ) 10 MG TABS tablet [id number] Yes Take 1 tablet (10 mg total) by mouth daily. Jodie Lavern LITTIE, MD  Active Child, Pharmacy Records  diclofenac  Sodium (VOLTAREN ) 1 % GEL [id number] Yes Apply 4 GRAMS topically 4 TIMES daily AS NEEDED FOR PAIN Jodie Lavern LITTIE, MD  Active Child, Pharmacy Records  FEROSUL [phone]) MG tablet [id number] Yes TAKE 1 TABLET BY MOUTH 2 TIMES DAILY Jodie Lavern LITTIE, MD  Active Child, Pharmacy Records  furosemide  (LASIX ) 40 MG tablet [id number] Yes TAKE 1 TABLET BY MOUTH EVERY DAY Webb, Padonda B, FNP  Active Child, Pharmacy Records  HYDROcodone -acetaminophen  (NORCO/VICODIN) 5-325 MG tablet [id number] Yes TAKE 1 TABLET BY MOUTH 2 TIMES DAILY Jodie Lavern LITTIE, MD  Active Child, Pharmacy Records  losartan  (COZAAR ) 100 MG tablet [id number] Yes TAKE 1 TABLET BY MOUTH EVERY DAY Jodie Lavern LITTIE, MD  Active Child, Pharmacy Records  Lutein  20 MG CAPS [id number] Yes TAKE 1 CAPSULE BY MOUTH EVERY DAY Jodie Lavern LITTIE, MD  Active Child, Pharmacy Records  magnesium  oxide (MAG-OX) 400 (240 Mg) MG tablet [id number] Yes TAKE 1 TABLET BY MOUTH ONCE DAILY Jodie Lavern LITTIE,  MD  Active Child, Pharmacy Records  meclizine  (ANTIVERT ) 12.5 MG tablet [id number] Yes Take 1 tablet (12.5 mg total) by mouth 2 (two) times daily as needed for dizziness. [name], MD  Active Child, Pharmacy Records  metoprolol  succinate (TOPROL -XL) 25 MG 24 hr tablet [id number] Yes TAKE 1 TABLET BY MOUTH  EVERY DAY [name], MD  Active Child, Pharmacy Records  omeprazole  [facility]) 20 MG capsule [id number] Yes TAKE 1 CAPSULE BY MOUTH EVERY DAY BEFORE BREAKFAST [name], MD  Active Child, Pharmacy Records  oxybutynin  (DITROPAN ) 5 MG tablet [id number] Yes Take 1 tablet (5 mg total) by mouth at bedtime as needed for bladder spasms. Arlon Carliss ORN, DO  Active   triamcinolone  (KENALOG ) 0.1 % [id number] Yes Apply 1 application topically 2 (two) times daily. For 2 weeks, then as needed [name], MD  Active Child, Pharmacy Records            Home Care and Equipment/Supplies: Were Home Health Services Ordered?: Yes Name of Home Health Agency:: [facility] Has Agency set up a time to come to your home?: Yes First Home Health Visit Date: 02/23/24 Any new equipment or medical supplies ordered?: Yes Name of Medical supply agency?: rolling walker  from [facility] [place] Were you able to get the equipment/medical supplies?: Yes Do you have any questions related to the use of the equipment/supplies?: No  Functional Questionnaire: Do you need assistance with bathing/showering or dressing?: Yes (shower seat) Do you need assistance with meal preparation?: Yes (family assists) Do you need assistance with eating?: No Do you have difficulty maintaining continence: No Do you need assistance with getting out of bed/getting out of a chair/moving?: Yes (rolling walker) Do you have difficulty managing or taking your medications?: Yes (daughter provides oversight)  Follow up appointments reviewed: PCP Follow-up appointment confirmed?: Yes (collaborated with care guide and post hospital follow up appointment scheduled, pt verbalizes understanding) Date of PCP follow-up appointment?: 03/08/24 Follow-up Provider: Lavern Jodie MD @ 1030 am Specialist Hospital Follow-up appointment confirmed?: NA Do you need transportation to your follow-up appointment?: No Do you understand care options if your  condition(s) worsen?: Yes-patient verbalized understanding  SDOH Interventions Today    Flowsheet Row Most Recent Value  SDOH Interventions   Food Insecurity Interventions Intervention Not Indicated  Housing Interventions Intervention Not Indicated  Transportation Interventions Intervention Not Indicated  Utilities Interventions Intervention Not Indicated    Goals Addressed             This Visit's Progress    VBCI Transitions of Care (TOC) Care Plan       Problems:  Recent Hospitalization for treatment of Hemorrhagic cystitis and Alzheimer's dementia Equipment/DME barrier patient's glucometer is old and broken, pt requests new one and No Hospital Follow Up Provider appointment collaborated with care guide and scheduled appointment Patient is alert and oriented x 3 today  Goal:  Over the next 30 days, the patient will not experience hospital readmission  Interventions:  Evaluation of current treatment plan related to Hemorrhagic cystitis, Inability to perform ADL's independently self-management and patient's adherence to plan as established by provider. Discussed plans with patient for ongoing care management follow up and provided patient with direct contact information for care management team Evaluation of current treatment plan related to Hemorrhagic cystitis and patient's adherence to plan as established by provider Advised patient to report any issues/ difficulty with urination to primary care provider Provided education to patient re: signs/ symptoms of cystitis Reviewed  medications with patient and discussed importance of taking as prescribed Collaborated with care guide regarding scheduling post hospital follow up appointment- pt will see Dr. Jodie on 03/08/24 @ 1030 am Screening for signs and symptoms of depression related to chronic disease state  Assessed social determinant of health barriers Reviewed upcoming scheduled appointments In basket message sent to Dr.  Lavern Jodie requesting prescription for glucometer, lancets, etc be sent to patient's pharmacy Routed today's note to primary care provider  Patient Self Care Activities:  Attend all scheduled provider appointments Attend church or other social activities Call pharmacy for medication refills 3-7 days in advance of running out of medications Call provider office for new concerns or questions  Notify RN Care Manager of [facility] call rescheduling needs Participate in Transition of Care Program/Attend [facility] scheduled calls Take medications as prescribed   See Dr. Jodie on 03/08/24 @ 1030 am  Plan:  Telephone follow up appointment with care management team member scheduled for:  02/28/24 @ 930 am The patient has been provided with contact information for the care management team and has been advised to call with any health related questions or concerns.          [name] [facility], BSN RN Care Manager/ Transition of Care [place]/ [facility] [phone]

## 2024-02-27 DIAGNOSIS — N3080 Other cystitis without hematuria: Secondary | ICD-10-CM | POA: Diagnosis not present

## 2024-02-27 DIAGNOSIS — E1142 Type 2 diabetes mellitus with diabetic polyneuropathy: Secondary | ICD-10-CM | POA: Diagnosis not present

## 2024-02-27 DIAGNOSIS — N179 Acute kidney failure, unspecified: Secondary | ICD-10-CM | POA: Diagnosis not present

## 2024-02-27 DIAGNOSIS — N1832 Chronic kidney disease, stage 3b: Secondary | ICD-10-CM | POA: Diagnosis not present

## 2024-02-27 DIAGNOSIS — E1122 Type 2 diabetes mellitus with diabetic chronic kidney disease: Secondary | ICD-10-CM | POA: Diagnosis not present

## 2024-02-28 ENCOUNTER — Other Ambulatory Visit: Payer: Self-pay | Admitting: *Deleted

## 2024-02-28 ENCOUNTER — Telehealth: Payer: Self-pay

## 2024-02-28 NOTE — Patient Instructions (Signed)
 Visit Information  Thank you for taking time to visit with me today. Please don't hesitate to contact me if I can be of assistance to you before our next scheduled telephone appointment.  Our next appointment is by telephone on 03/06/24 @ 115 pm  Following is a copy of your care plan:   Goals Addressed             This Visit's Progress    VBCI Transitions of Care (TOC) Care Plan       Problems:  Recent Hospitalization for treatment of Hemorrhagic cystitis and Alzheimer's dementia Equipment/DME barrier patient's glucometer is old and broken, pt requests new one and No Hospital Follow Up Provider appointment collaborated with care guide and scheduled appointment Patient is alert and oriented x 3 today 7/1- spoke with pt who reports she is doing well, denies any urinary symptoms, no bleeding, [facility] is active with pt  Goal:  Over the next 30 days, the patient will not experience hospital readmission  Interventions:  Evaluation of current treatment plan related to Hemorrhagic cystitis, Inability to perform ADL's independently self-management and patient's adherence to plan as established by provider. Discussed plans with patient for ongoing care management follow up and provided patient with direct contact information for care management team Reviewed upcoming scheduled appointments including primary care provider 03/08/24 Reviewed with pt she will have to see primary care provider before new glucometer can be called in, pt verbalizes understanding Reviewed importance of working with home health, completing prescribed exercises  Patient Self Care Activities:  Attend all scheduled provider appointments Attend church or other social activities Call pharmacy for medication refills 3-7 days in advance of running out of medications Call provider office for new concerns or questions  Notify RN Care Manager of TOC call rescheduling needs Participate in Transition of Care  Program/Attend TOC scheduled calls Take medications as prescribed   See Dr. Jodie on 03/08/24 @ 1030 am  Plan: Telephone outreach with RN Care Manager 03/06/24 @ 115 pm        Patient verbalizes understanding of instructions and care plan provided today and agrees to view in MyChart. Active MyChart status and patient understanding of how to access instructions and care plan via MyChart confirmed with patient.     Telephone follow up appointment with care management team member scheduled for:  03/06/24 @ 115 pm  Please call the care guide team at [phone] if you need to cancel or reschedule your appointment.   Please call the Suicide and Crisis Lifeline: 988 call the [place]  National Suicide Prevention Lifeline: 478 025 2524 or TTY: 239-823-7386 TTY 442-155-7834) to talk to a trained counselor call 1-800-273-TALK (toll free, 24 hour hotline) go to [facility] [address] [place] [phone]) call 911 if you are experiencing a Mental Health or Behavioral Health Crisis or need someone to talk to.  [name] [facility], BSN RN Care Manager/ Transition of Care [place]/ [facility] [phone]

## 2024-02-28 NOTE — Patient Outreach (Signed)
  Transition of Care week 2  Visit Note  02/28/2024  Name: Krista Clarke MRN: [id number]          DOB: May 06, 1941  Situation: Patient enrolled in [facility] 30-day program. Visit completed with patient by telephone.   Background:    Past Medical History:  Diagnosis Date   Anemia    takes iron daily   Arthritis    Complete heart block (HCC) 08/20/2015   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     Diabetes mellitus without complication (HCC)    Encounter for care of pacemaker 05/24/2019   Hypercholesteremia    Hypertension    Pacemaker; 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 Addie 2010 lumbar MRI, multilevel and severe.    Assessment: Patient Reported Symptoms: Cognitive Cognitive Status: No symptoms reported, Able to follow simple commands, Alert and oriented to person, place, and time      Neurological Neurological Review of Symptoms: No symptoms reported    HEENT HEENT Symptoms Reported: No symptoms reported      Cardiovascular Cardiovascular Symptoms Reported: No symptoms reported    Respiratory Respiratory Symptoms Reported: No symptoms reported    Endocrine Endocrine Symptoms Reported: No symptoms reported Is patient diabetic?: Yes Is patient checking blood sugars at home?: No Endocrine Self-Management Outcome: 3 (uncertain) Endocrine Comment: Message received today from primary care provider stating will not call in new glucometer until she sees pt in her office,  pt has appointment for 03/08/24, reviewed this with pt,  pt verbalizes understanding  Gastrointestinal Gastrointestinal Symptoms Reported: No symptoms reported Additional Gastrointestinal Details: pt denies constipation      Genitourinary Genitourinary Symptoms Reported: No symptoms reported    Integumentary Integumentary Symptoms Reported: No symptoms reported     Musculoskeletal Musculoskelatal Symptoms Reviewed: Unsteady gait Musculoskeletal Management Strategies: Adequate rest, Routine screening Musculoskeletal Self-Management Outcome: 3 (uncertain) Musculoskeletal Comment: uses rolling walker      Psychosocial Psychosocial Symptoms Reported: No symptoms reported         There were no vitals filed for this visit.  Medications Reviewed Today   Medications were not reviewed in this encounter     Recommendation:   PCP Follow-up Continue Current Plan of Care  Follow Up Plan:   Telephone follow-up 03/06/24 @ 115 pm  [name] [facility], BSN RN Care Manager/ Transition of Care [place]/ [facility] [phone]

## 2024-02-28 NOTE — Telephone Encounter (Signed)
 Copied from CRM #[id number]. Topic: Clinical - Home Health Verbal Orders >> Feb 27, 2024 11:40 AM Mercedes MATSU wrote: Caller/Agency: Rosaline > [facility] Callback Number: [phone] (secure line) Service Requested: Occupational Therapy Frequency: 1 TIME A WEEK FOR 1 WEEK, 2 TIMES A WEEK FOR 3 WEEKS, 1 TIME A WEEK FOR ONE WEEK. Any new concerns about the patient? No  Spoke with Rosaline to give verbal orders to move forward with request

## 2024-03-01 ENCOUNTER — Other Ambulatory Visit: Payer: Self-pay | Admitting: Family

## 2024-03-05 DIAGNOSIS — N3080 Other cystitis without hematuria: Secondary | ICD-10-CM | POA: Diagnosis not present

## 2024-03-05 DIAGNOSIS — N1832 Chronic kidney disease, stage 3b: Secondary | ICD-10-CM | POA: Diagnosis not present

## 2024-03-05 DIAGNOSIS — E1142 Type 2 diabetes mellitus with diabetic polyneuropathy: Secondary | ICD-10-CM | POA: Diagnosis not present

## 2024-03-05 DIAGNOSIS — E1122 Type 2 diabetes mellitus with diabetic chronic kidney disease: Secondary | ICD-10-CM | POA: Diagnosis not present

## 2024-03-05 DIAGNOSIS — N179 Acute kidney failure, unspecified: Secondary | ICD-10-CM | POA: Diagnosis not present

## 2024-03-06 ENCOUNTER — Telehealth: Payer: Self-pay | Admitting: Family Medicine

## 2024-03-06 ENCOUNTER — Other Ambulatory Visit: Payer: Self-pay | Admitting: *Deleted

## 2024-03-06 ENCOUNTER — Ambulatory Visit: Admitting: Podiatry

## 2024-03-06 NOTE — Patient Outreach (Addendum)
 Transition of Care week 3  Visit Note  03/06/2024  Name: Krista Clarke MRN: [id number]          DOB: 10-29-1940  Situation: Patient enrolled in [facility] 30-day program. Visit completed with patient by telephone.   Background:    Past Medical History:  Diagnosis Date   Anemia    takes iron daily   Arthritis    Complete heart block (HCC) 08/20/2015   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     Diabetes mellitus without complication (HCC)    Encounter for care of pacemaker 05/24/2019   Hypercholesteremia    Hypertension    Pacemaker; 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 Addie 2010 lumbar MRI, multilevel and severe.    Assessment: Patient Reported Symptoms: Cognitive Cognitive Status: No symptoms reported, Able to follow simple commands, Alert and oriented to person, place, and time      Neurological Neurological Review of Symptoms: No symptoms reported    HEENT HEENT Symptoms Reported: No symptoms reported      Cardiovascular Cardiovascular Symptoms Reported: No symptoms reported    Respiratory Respiratory Symptoms Reported: No symptoms reported    Endocrine Endocrine Symptoms Reported: No symptoms reported Is patient diabetic?: Yes Is patient checking blood sugars at home?: No Endocrine Self-Management Outcome: 3 (uncertain) Endocrine Comment: pt to see primary care provider on 7/10- will discuss getting new glucometer- per provider- pt has to be seen before new glucometer can be called in  Gastrointestinal Gastrointestinal Symptoms Reported: No symptoms reported      Genitourinary Genitourinary Symptoms Reported: No symptoms reported    Integumentary Integumentary Symptoms Reported: No symptoms reported    Musculoskeletal Musculoskelatal Symptoms Reviewed: Unsteady gait Musculoskeletal Management Strategies: Adequate rest, Routine  screening Musculoskeletal Self-Management Outcome: 3 (uncertain) Musculoskeletal Comment: uses rolling walker, reviewed safety precautions      Psychosocial Psychosocial Symptoms Reported: No symptoms reported         There were no vitals filed for this visit.  Medications Reviewed Today     Reviewed by Aura Mliss LABOR, RN (Registered Nurse) on 03/06/24 at 1330  Med List Status: <None>   Medication Order Taking? Sig Documenting Provider Last Dose Status Informant  allopurinol  (ZYLOPRIM ) 100 MG tablet [id number]  TAKE 1 TABLET BY MOUTH EVERY MORNING Jodie Lavern LITTIE, MD  Active Child, Pharmacy Records  amLODipine  (NORVASC ) 5 MG tablet [id number]  TAKE 1 TABLET BY MOUTH EVERY DAY Jodie Lavern LITTIE, MD  Active Child, Pharmacy Records  ascorbic acid  (VITAMIN C ) 500 MG tablet [id number]  TAKE 1 TABLET BY MOUTH EVERY DAY Jodie Lavern LITTIE, MD  Active Child, Pharmacy Records  ASPIRIN  LOW DOSE 81 MG chewable tablet [id number]  CHEW ONE TABLET IN MOUTH EVERY DAY Jodie Lavern LITTIE, MD  Active Child, Pharmacy Records  atorvastatin  (LIPITOR) 40 MG tablet [id number]  Take 1 tablet (40 mg total) by mouth at bedtime. Jodie Lavern LITTIE, MD  Active Child, Pharmacy Records  cyanocobalamin  (VITAMIN B12) 1000 MCG tablet [id number]  TAKE 1 TABLET BY MOUTH EVERY DAY Jodie Lavern LITTIE, MD  Active Child, Pharmacy Records  dapagliflozin  propanediol (FARXIGA ) 10 MG TABS tablet [id number]  Take 1 tablet (10 mg total) by mouth daily. Jodie Lavern LITTIE, MD  Active Child, Pharmacy Records  diclofenac  Sodium (VOLTAREN ) 1 % GEL [id number]  Apply 4 GRAMS topically 4 TIMES daily AS  NEEDED FOR PAIN [name], MD  Active Child, Pharmacy Records  FEROSUL 325 [phone]) MG tablet [id number]  TAKE 1 TABLET BY MOUTH 2 TIMES DAILY [name], MD  Active Child, Pharmacy Records  furosemide  (LASIX ) 40 MG tablet [id number]  TAKE 1 TABLET BY MOUTH EVERY DAY Webb, Padonda B, FNP  Active Child, Pharmacy Records  HYDROcodone -acetaminophen   (NORCO/VICODIN) 5-325 MG tablet [id number]  TAKE 1 TABLET BY MOUTH 2 TIMES DAILY [name], MD  Active Child, Pharmacy Records  losartan  (COZAAR ) 100 MG tablet [id number]  TAKE 1 TABLET BY MOUTH EVERY DAY [name], MD  Active Child, Pharmacy Records  Lutein  20 MG CAPS [id number]  TAKE 1 CAPSULE BY MOUTH EVERY DAY [name], MD  Active Child, Pharmacy Records  magnesium  oxide (MAG-OX) 400 (240 Mg) MG tablet [id number]  TAKE 1 TABLET BY MOUTH ONCE DAILY [name], MD  Active Child, Pharmacy Records  meclizine  (ANTIVERT ) 12.5 MG tablet [id number]  Take 1 tablet (12.5 mg total) by mouth 2 (two) times daily as needed for dizziness. [name], MD  Active Child, Pharmacy Records  metoprolol  succinate (TOPROL -XL) 25 MG 24 hr tablet [id number]  TAKE 1 TABLET BY MOUTH EVERY DAY [name], MD  Active Child, Pharmacy Records  omeprazole  [facility]) 20 MG capsule [id number]  TAKE 1 CAPSULE BY MOUTH EVERY DAY BEFORE BREAKFAST [name], MD  Active Child, Pharmacy Records  oxybutynin  (DITROPAN ) 5 MG tablet [id number]  Take 1 tablet (5 mg total) by mouth at bedtime as needed for bladder spasms. Arlon Carliss ORN, DO  Active   triamcinolone  (KENALOG ) 0.1 % [id number]  Apply 1 application topically 2 (two) times daily. For 2 weeks, then as needed [name], MD  Active Child, Pharmacy Records            Goals Addressed             This Visit's Progress    VBCI Transitions of Care (TOC) Care Plan       Problems:  Recent Hospitalization for treatment of Hemorrhagic cystitis and Alzheimer's dementia Equipment/DME barrier patient's glucometer is old and broken, pt requests new one and No Hospital Follow Up Provider appointment collaborated with care guide and scheduled appointment Patient is alert and oriented x 3 today 7/8- spoke with pt who reports she is doing well, denies any urinary symptoms, no bleeding, [facility] is active with pt, no new concerns  reported  Goal:  Over the next 30 days, the patient will not experience hospital readmission  Interventions:  Evaluation of current treatment plan related to Hemorrhagic cystitis, Inability to perform ADL's independently self-management and patient's adherence to plan as established by provider. Discussed plans with patient for ongoing care management follow up and provided patient with direct contact information for care management team Reviewed upcoming scheduled appointments including primary care provider 03/08/24 Reinforced with pt she will have to see primary care provider before new glucometer can be called in, pt verbalizes understanding Reinforced importance of working with home health, completing prescribed exercises  Patient Self Care Activities:  Attend all scheduled provider appointments Attend church or other social activities Call pharmacy for medication refills 3-7 days in advance of running out of medications Call provider office for new concerns or questions  Notify RN Care Manager of TOC call rescheduling needs Participate in Transition of Care Program/Attend TOC scheduled calls Take medications as prescribed   See Dr. Jodie on 03/08/24 @ 1030  am  Plan: Telephone outreach with RN Care Manager 03/15/24 @ 1015 am         Recommendation:   PCP Follow-up  Follow Up Plan:   Telephone follow-up 03/15/24 @ 1015 am  [name] [facility], BSN RN Care Manager/ Transition of Care [place]/ [facility] [phone]

## 2024-03-06 NOTE — Telephone Encounter (Signed)
Form has been placed in providers box for completion

## 2024-03-06 NOTE — Telephone Encounter (Signed)
 Received faxed  document Home Health Certificate (Order ID [id number] ), to be filled out by provider. Patient requested to send it back via Fax . Document is located in providers tray at front office.Please advise

## 2024-03-06 NOTE — Patient Instructions (Addendum)
 Visit Information  Thank you for taking time to visit with me today. Please don't hesitate to contact me if I can be of assistance to you before our next scheduled telephone appointment.  Our next appointment is by telephone on 03/15/24 at 1015 am  Following is a copy of your care plan:   Goals Addressed             This Visit's Progress    [facility] Transitions of Care (TOC) Care Plan       Problems:  Recent Hospitalization for treatment of Hemorrhagic cystitis and Alzheimer's dementia Equipment/DME barrier patient's glucometer is old and broken, pt requests new one and No Hospital Follow Up Provider appointment collaborated with care guide and scheduled appointment Patient is alert and oriented x 3 today 7/8- spoke with pt who reports she is doing well, denies any urinary symptoms, no bleeding, [facility] is active with pt, no new concerns reported  Goal:  Over the next 30 days, the patient will not experience hospital readmission  Interventions:  Evaluation of current treatment plan related to Hemorrhagic cystitis, Inability to perform ADL's independently self-management and patient's adherence to plan as established by provider. Discussed plans with patient for ongoing care management follow up and provided patient with direct contact information for care management team Reviewed upcoming scheduled appointments including primary care provider 03/08/24 Reinforced with pt she will have to see primary care provider before new glucometer can be called in, pt verbalizes understanding Reinforced importance of working with home health, completing prescribed exercises  Patient Self Care Activities:  Attend all scheduled provider appointments Attend church or other social activities Call pharmacy for medication refills 3-7 days in advance of running out of medications Call provider office for new concerns or questions  Notify RN Care Manager of TOC call rescheduling  needs Participate in Transition of Care Program/Attend TOC scheduled calls Take medications as prescribed   See Dr. Jodie on 03/08/24 @ 1030 am  Plan: Telephone outreach with RN Care Manager 03/15/24 @ 1015 am         Patient verbalizes understanding of instructions and care plan provided today and agrees to view in MyChart. Active MyChart status and patient understanding of how to access instructions and care plan via MyChart confirmed with patient.     Telephone follow up appointment with care management team member scheduled for:  Please call the care guide team at [phone] if you need to cancel or reschedule your appointment.   Please call the Suicide and Crisis Lifeline: 988 call the [place]  National Suicide Prevention Lifeline: 931 517 1374 or TTY: (737) 582-1711 TTY 504-199-4480) to talk to a trained counselor call 1-800-273-TALK (toll free, 24 hour hotline) call the [facility]: [phone] call 911 if you are experiencing a Mental Health or Behavioral Health Crisis or need someone to talk to.  [name] [facility], BSN RN Care Manager/ Transition of Care [place]/ [facility] [phone]

## 2024-03-07 DIAGNOSIS — N179 Acute kidney failure, unspecified: Secondary | ICD-10-CM | POA: Diagnosis not present

## 2024-03-07 DIAGNOSIS — E1142 Type 2 diabetes mellitus with diabetic polyneuropathy: Secondary | ICD-10-CM | POA: Diagnosis not present

## 2024-03-07 DIAGNOSIS — E1122 Type 2 diabetes mellitus with diabetic chronic kidney disease: Secondary | ICD-10-CM | POA: Diagnosis not present

## 2024-03-07 DIAGNOSIS — N1832 Chronic kidney disease, stage 3b: Secondary | ICD-10-CM | POA: Diagnosis not present

## 2024-03-07 DIAGNOSIS — N3080 Other cystitis without hematuria: Secondary | ICD-10-CM | POA: Diagnosis not present

## 2024-03-08 ENCOUNTER — Telehealth: Payer: Self-pay

## 2024-03-08 ENCOUNTER — Ambulatory Visit (INDEPENDENT_AMBULATORY_CARE_PROVIDER_SITE_OTHER): Admitting: Family Medicine

## 2024-03-08 ENCOUNTER — Encounter: Payer: Self-pay | Admitting: Family Medicine

## 2024-03-08 ENCOUNTER — Other Ambulatory Visit: Payer: Self-pay

## 2024-03-08 VITALS — BP 110/64 | HR 77 | Temp 97.7°F | Ht 63.0 in | Wt 196.6 lb

## 2024-03-08 DIAGNOSIS — I10 Essential (primary) hypertension: Secondary | ICD-10-CM | POA: Diagnosis not present

## 2024-03-08 DIAGNOSIS — E782 Mixed hyperlipidemia: Secondary | ICD-10-CM

## 2024-03-08 DIAGNOSIS — M5441 Lumbago with sciatica, right side: Secondary | ICD-10-CM

## 2024-03-08 DIAGNOSIS — E1121 Type 2 diabetes mellitus with diabetic nephropathy: Secondary | ICD-10-CM | POA: Diagnosis not present

## 2024-03-08 DIAGNOSIS — E1140 Type 2 diabetes mellitus with diabetic neuropathy, unspecified: Secondary | ICD-10-CM | POA: Diagnosis not present

## 2024-03-08 DIAGNOSIS — N1832 Chronic kidney disease, stage 3b: Secondary | ICD-10-CM

## 2024-03-08 DIAGNOSIS — F411 Generalized anxiety disorder: Secondary | ICD-10-CM

## 2024-03-08 DIAGNOSIS — I442 Atrioventricular block, complete: Secondary | ICD-10-CM

## 2024-03-08 DIAGNOSIS — Z87440 Personal history of urinary (tract) infections: Secondary | ICD-10-CM | POA: Diagnosis not present

## 2024-03-08 DIAGNOSIS — G309 Alzheimer's disease, unspecified: Secondary | ICD-10-CM | POA: Diagnosis not present

## 2024-03-08 DIAGNOSIS — M5442 Lumbago with sciatica, left side: Secondary | ICD-10-CM | POA: Diagnosis not present

## 2024-03-08 DIAGNOSIS — N3080 Other cystitis without hematuria: Secondary | ICD-10-CM | POA: Diagnosis not present

## 2024-03-08 DIAGNOSIS — F39 Unspecified mood [affective] disorder: Secondary | ICD-10-CM

## 2024-03-08 DIAGNOSIS — F0283 Dementia in other diseases classified elsewhere, unspecified severity, with mood disturbance: Secondary | ICD-10-CM

## 2024-03-08 DIAGNOSIS — N3091 Cystitis, unspecified with hematuria: Secondary | ICD-10-CM

## 2024-03-08 DIAGNOSIS — E1169 Type 2 diabetes mellitus with other specified complication: Secondary | ICD-10-CM | POA: Diagnosis not present

## 2024-03-08 DIAGNOSIS — N179 Acute kidney failure, unspecified: Secondary | ICD-10-CM | POA: Diagnosis not present

## 2024-03-08 DIAGNOSIS — E1142 Type 2 diabetes mellitus with diabetic polyneuropathy: Secondary | ICD-10-CM | POA: Diagnosis not present

## 2024-03-08 DIAGNOSIS — F1120 Opioid dependence, uncomplicated: Secondary | ICD-10-CM

## 2024-03-08 DIAGNOSIS — B9620 Unspecified Escherichia coli [E. coli] as the cause of diseases classified elsewhere: Secondary | ICD-10-CM | POA: Diagnosis not present

## 2024-03-08 DIAGNOSIS — Z950 Presence of cardiac pacemaker: Secondary | ICD-10-CM

## 2024-03-08 DIAGNOSIS — F02A0 Dementia in other diseases classified elsewhere, mild, without behavioral disturbance, psychotic disturbance, mood disturbance, and anxiety: Secondary | ICD-10-CM

## 2024-03-08 DIAGNOSIS — F0284 Dementia in other diseases classified elsewhere, unspecified severity, with anxiety: Secondary | ICD-10-CM

## 2024-03-08 DIAGNOSIS — E1122 Type 2 diabetes mellitus with diabetic chronic kidney disease: Secondary | ICD-10-CM | POA: Diagnosis not present

## 2024-03-08 LAB — POCT GLYCOSYLATED HEMOGLOBIN (HGB A1C): Hemoglobin A1C: 6.9 % — AB (ref 4.0–5.6)

## 2024-03-08 LAB — LIPID PANEL
Cholesterol: 121 mg/dL (ref 0–200)
HDL: 45.5 mg/dL (ref >39.00)
LDL Cholesterol: 52 mg/dL (ref 0–99)
NonHDL: 75.43
Total CHOL/HDL Ratio: 3
Triglycerides: 115 mg/dL (ref 0.0–149.0)
VLDL: 23 mg/dL (ref 0.0–40.0)

## 2024-03-08 LAB — TSH: TSH: 3.12 u[IU]/mL (ref 0.35–5.50)

## 2024-03-08 LAB — BRAIN NATRIURETIC PEPTIDE: Pro B Natriuretic peptide (BNP): 76 pg/mL (ref 0.0–100.0)

## 2024-03-08 MED ORDER — ACCU-CHEK SOFTCLIX LANCETS MISC: 12 refills | Status: DC

## 2024-03-08 MED ORDER — ACCU-CHEK GUIDE TEST VI STRP: ORAL_STRIP | 12 refills | Status: DC

## 2024-03-08 MED ORDER — ACCU-CHEK GUIDE ME W/DEVICE KIT: PACK | 0 refills | Status: DC

## 2024-03-08 NOTE — Patient Instructions (Signed)
 Please return in 6 months For follow up on chronic medical conditions   I will release your lab results to you on your MyChart account with further instructions. You may see the results before I do, but when I review them I will send you a message with my report or have my assistant call you if things need to be discussed. Please reply to my message with any questions. Thank you!   If you have any questions or concerns, please don't hesitate to send me a message via MyChart or call the office at [phone]. Thank you for visiting with us  today! It's our pleasure caring for you.    VISIT SUMMARY: During today's visit, we discussed your increasing difficulty with walking and leg swelling, your diabetes management, chronic kidney disease, memory concerns, and a skin condition on your hands. We also reviewed your general health maintenance, including routine blood work.  YOUR PLAN: -LEG WEAKNESS AND SWELLING: You have been experiencing worsening leg weakness and intermittent swelling, which now requires you to use a wheelchair. This could be due to various factors, including your recent hospitalization. We recommend continuing with your home physical therapy to help improve your strength.  -CHRONIC KIDNEY DISEASE: Chronic kidney disease means your kidneys are not functioning as well as they should. We will order blood work to assess your kidney function and ensure you have an annual follow-up with your nephrologist, Dr. Dalene.  -DIABETES MELLITUS TYPE 2: Your diabetes is well-controlled with a current A1c of 6.9. To help you monitor your blood sugar levels at home, we will order a new glucometer for you.  -MEMORY CONCERNS: You have been experiencing some memory issues. We will evaluate your memory status to determine if any medication is appropriate.  -SKIN CONDITION ON HANDS: You have a persistent skin condition on your hands that has been present for a couple of months. We will continue to  monitor this condition to determine the best course of action.  -GENERAL HEALTH MAINTENANCE: We will order routine blood work to check your cholesterol and thyroid  function. Your recent weight loss is noted as a positive change.  INSTRUCTIONS: Please continue with your home physical therapy exercises. We will order blood work to assess your kidney function, cholesterol, and thyroid  function. Make sure to follow up with your nephrologist, Dr. Dalene, annually. We will also order a new glucometer for you to monitor your blood sugar levels at home. Additionally, we will evaluate your memory status to see if any medication is needed. If you have any concerns or notice any changes in your condition, please contact our office.                      Contains text generated by Abridge.                                 Contains text generated by Abridge.

## 2024-03-08 NOTE — Progress Notes (Signed)
 Subjective  CC:  Chief Complaint  Patient presents with   Hospitalization Follow-up    02/18/2024 - 02/22/2024 (4 days) [facility]       HPI: Krista Clarke is a 83 y.o. female who presents to the office today to address the problems listed above in the chief complaint. Discussed the use of AI scribe software for clinical note transcription with the patient, who gave verbal consent to proceed.  History of Present Illness Krista Clarke is an 83 year old female with diabetes and kidney issues who presents with difficulty walking and leg swelling. She was hospitalized back in June 1, and diagnosed and treated for hemorrhagic cystitis.  Fortunately, she has recovered.  She also had acute kidney injury and chronic kidney disease at that time.  Since, she is struggling to get back her full strength but overall is doing fine.  No fevers chills further urinary symptoms further blood in the urine pain or abdominal pain.  I reviewed all notes from the ER and hospital course.  I reviewed her CT scan and lab studies.  She has been experiencing increasing difficulty with walking, noting that her legs and feet are problematic. Her condition has worsened since her last visit, and she states, 'it seemed like I went downhill instead of getting better.' She has been using a walker but recently required a wheelchair due to sudden weakness while walking. This weakness has been occurring more frequently since her stay in [place].  She describes intermittent swelling in her legs, with some days being worse than others.  Has chronic edema on Lasix .  No current urinary problems, including blood in the urine, are reported.  She mentions that she is eating and drinking adequately, although not in large quantities. She is managing her medications well and is currently not on any memory medication. She has experienced some weight loss since her last visit, which she attributes to a needed change.  She has a  history of diabetes, with a current A1c of 6.9. She previously used a glucometer, which is now broken, and wants a new one to monitor her blood sugar levels at home.  She has been receiving physical therapy at home and is performing her exercises, although her strength is not where it used to be. She also reports some skin issues on her hands that have been present for a couple of months, with no similar issues on other parts of her body.  Chronic kidney disease: Due for recheck to ensure stability.  Chronic kidney disease due to hypertension and diabetes.  Has seen Dr. Dalene over the past.  Last in 2023.  She has not had further follow-up.  His notes she reports she has a low likelihood of renal failure in her lifetime.  Routine preventive measures recommended.   Assessment  1. Hemorrhagic cystitis   2. Type 2 diabetes, controlled, with neuropathy (HCC)   3. Diabetic nephropathy associated with type 2 diabetes mellitus (HCC)   4. Chronic midline low back pain with bilateral sciatica   5. Chronic narcotic dependence (HCC)   6. Essential hypertension   7. Combined hyperlipidemia associated with type 2 diabetes mellitus (HCC)   8. Mild late onset Alzheimer's dementia without behavioral disturbance, psychotic disturbance, mood disturbance, or anxiety (HCC)   9. 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 Stage 3b chronic kidney disease (HCC)   11. Complete heart block (HCC)  Plan  Assessment and Plan Assessment & Plan Leg weakness and swelling Worsening leg weakness and intermittent swelling, now requiring wheelchair use. Increased weakness frequency post-hospitalization. - Continue home physical therapy. -Stable chronic edema, continue Lasix   Chronic kidney disease - Order blood work to assess kidney function.   Diabetes mellitus type 2 Diabetes well-controlled with A1c of 6.9. Glucometer needed for home monitoring. - Order new  glucometer.  Mild dementia: Failed Aricept  in the past.  Continue to monitor.  Recommend annual wellness visit for testing.  Hypertension is well-controlled  Hyperlipidemia on statin due for recheck today.  Check thyroid  as well.  General Health Maintenance Routine blood work due for cholesterol and thyroid  function. Weight loss noted as positive. - Order blood work for cholesterol and thyroid  function.   I spent a total of 45 minutes for this patient encounter. Time spent included preparation, face-to-face counseling with the patient and coordination of care, review of chart and records, and documentation of the encounter.  Follow up: 6 months for recheck. Orders Placed This Encounter  Procedures   Lipid panel   TSH   B Nat Peptide   POCT HgB A1C   Meds ordered this encounter  Medications   Accu-Chek Softclix Lancets lancets    Sig: Use as instructed    Dispense:  100 each    Refill:  12   Blood Glucose Monitoring Suppl (ACCU-CHEK GUIDE ME) w/Device KIT    Sig: USE AS DIRECTED    Dispense:  1 kit    Refill:  0   glucose blood (ACCU-CHEK GUIDE TEST) test strip    Sig: Use as instructed    Dispense:  100 each    Refill:  12     I reviewed the patients updated PMH, FH, and SocHx.  Patient Active Problem List   Diagnosis Date Noted   Stage 3b chronic kidney disease (HCC) 02/07/2023    Priority: High   Diabetic nephropathy associated with type 2 diabetes mellitus (HCC) 07/21/2022    Priority: High   Complete heart block (HCC) 09/21/2021    Priority: High   Lumbar stenosis 10/31/2019    Priority: High   Chronic narcotic dependence (HCC) 07/13/2019    Priority: High   Mild late onset Alzheimer's dementia without behavioral disturbance, psychotic disturbance, mood disturbance, or anxiety (HCC) 09/18/2018    Priority: High   PAD (peripheral artery disease) (HCC) 11/18/2016    Priority: High   Chronic midline low back pain with bilateral sciatica 10/02/2016     Priority: High   Severe obesity (BMI 35.0-35.9 with comorbidity) (HCC) 10/02/2016    Priority: High   Hyperlipidemia 10/02/2016    Priority: High   Essential hypertension 03/08/2016    Priority: High   Type 2 diabetes, controlled, with neuropathy (HCC) 03/08/2016    Priority: High   Pacemaker; 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    Priority: Medium    Paresthesia of both lower extremities 10/02/2016    Priority: Medium    Chronic disease anemia 03/08/2016    Priority:  Medium    Vitamin B12 deficiency 02/06/2020    Priority: Low   Hemorrhagic cystitis 02/19/2024   Non-insulin  dependent type 2 diabetes mellitus (HCC) 02/19/2024   Alzheimer disease (HCC) 02/19/2024   Mood disorder (HCC) 02/19/2024   GAD (generalized anxiety disorder) 02/19/2024   Peripheral neuropathy 02/19/2024   Acute kidney injury superimposed on chronic kidney disease (HCC) 02/19/2024   Skin necrosis (HCC) 03/30/2023   Current Meds  Medication Sig   Accu-Chek Softclix Lancets lancets Use as instructed   allopurinol  (ZYLOPRIM ) 100 MG tablet TAKE 1 TABLET BY MOUTH EVERY MORNING   amLODipine  (NORVASC ) 5 MG tablet TAKE 1 TABLET BY MOUTH EVERY DAY   ascorbic acid  (VITAMIN C ) 500 MG tablet TAKE 1 TABLET BY MOUTH EVERY DAY   ASPIRIN  LOW DOSE 81 MG chewable tablet CHEW ONE TABLET IN MOUTH EVERY DAY   atorvastatin  (LIPITOR) 40 MG tablet Take 1 tablet (40 mg total) by mouth at bedtime.   Blood Glucose Monitoring Suppl (ACCU-CHEK GUIDE ME) w/Device KIT USE AS DIRECTED   cyanocobalamin  (VITAMIN B12) 1000 MCG tablet TAKE 1 TABLET BY  MOUTH EVERY DAY   dapagliflozin  propanediol (FARXIGA ) 10 MG TABS tablet Take 1 tablet (10 mg total) by mouth daily.   diclofenac  Sodium (VOLTAREN ) 1 % GEL Apply 4 GRAMS topically 4 TIMES daily AS NEEDED FOR PAIN   FEROSUL 325 (65 Fe) MG tablet TAKE 1 TABLET BY MOUTH 2 TIMES DAILY   furosemide  (LASIX ) 40 MG tablet TAKE 1 TABLET BY MOUTH EVERY DAY   glucose blood (ACCU-CHEK GUIDE TEST) test strip Use as instructed   HYDROcodone -acetaminophen  (NORCO/VICODIN) 5-325 MG tablet TAKE 1 TABLET BY MOUTH 2 TIMES DAILY   losartan  (COZAAR ) 100 MG tablet TAKE 1 TABLET BY MOUTH EVERY DAY   Lutein  20 MG CAPS TAKE 1 CAPSULE BY MOUTH EVERY DAY   magnesium  oxide (MAG-OX) 400 (240 Mg) MG tablet TAKE 1 TABLET BY MOUTH ONCE DAILY   meclizine  (ANTIVERT ) 12.5 MG tablet Take 1 tablet (12.5 mg total) by mouth 2 (two) times daily as needed for dizziness.   metoprolol  succinate (TOPROL -XL) 25 MG 24 hr tablet TAKE 1 TABLET BY MOUTH EVERY DAY   omeprazole  (PRILOSEC) 20 MG capsule TAKE 1 CAPSULE BY MOUTH EVERY DAY BEFORE BREAKFAST   oxybutynin  (DITROPAN ) 5 MG tablet Take 1 tablet (5 mg total) by mouth at bedtime as needed for bladder spasms.   triamcinolone  (KENALOG ) 0.1 % Apply 1 application topically 2 (two) times daily. For 2 weeks, then as needed   Allergies: Patient is allergic to prednisone. Family History: Patient family history includes Breast cancer in her granddaughter; Cancer in her brother; Diabetes in her mother; Hypertension in her father and mother. Social History:  Patient  reports that she has never smoked. She has never used smokeless tobacco. She reports that she does not drink alcohol  and does not use drugs.  Review of Systems: Constitutional: Negative for fever malaise or anorexia Cardiovascular: negative for chest pain Respiratory: negative for SOB or persistent cough Gastrointestinal: negative for abdominal pain  Objective  Vitals: BP 110/64   Pulse 77   Temp 97.7 F (36.5 C)   Ht 5' 3  (1.6 m)   Wt 196 lb 9.6 oz (89.2 kg)   LMP  (LMP Unknown)   SpO2 98%   BMI 34.83 kg/m  General: no acute distress , A&Ox3, pleasant, conversive, answers questions appropriately. Cardiovascular:  RRR without murmur or gallop.  2+ stable pitting edema bilateral lower extremities. Respiratory:  Good breath  sounds bilaterally, CTAB with normal respiratory effort Skin:  Warm, no rashes Commons side effects, risks, benefits, and alternatives for medications and treatment plan prescribed today were discussed, and the patient expressed understanding of the given instructions. Patient is instructed to call or message via MyChart if he/she has any questions or concerns regarding our treatment plan. No barriers to understanding were identified. We discussed Red Flag symptoms and signs in detail. Patient expressed understanding regarding what to do in case of urgent or emergency type symptoms.  Medication list was reconciled, printed and provided to the patient in AVS. Patient instructions and summary information was reviewed with the patient as documented in the AVS. This note was prepared with assistance of Dragon voice recognition software. Occasional wrong-word or sound-a-like substitutions may have occurred due to the inherent limitations of voice recognition software

## 2024-03-08 NOTE — Telephone Encounter (Signed)
 Copied from CRM [id number]. Topic: Clinical - Prescription Issue >> Mar 08, 2024 12:48 PM Chiquita SQUIBB wrote: Reason for CRM: Daneilla from [facility] is calling in regarding the Accu-Chek Softclix Lancets lancets [[id number]], she stated they need more detailed instructions on how to use the strips/ how often to be able to bill the insurance. A good call back number is [phone]  I resent the lancets and strips with instructions

## 2024-03-09 DIAGNOSIS — N179 Acute kidney failure, unspecified: Secondary | ICD-10-CM | POA: Diagnosis not present

## 2024-03-09 DIAGNOSIS — E1122 Type 2 diabetes mellitus with diabetic chronic kidney disease: Secondary | ICD-10-CM | POA: Diagnosis not present

## 2024-03-09 DIAGNOSIS — N3080 Other cystitis without hematuria: Secondary | ICD-10-CM | POA: Diagnosis not present

## 2024-03-09 DIAGNOSIS — E1142 Type 2 diabetes mellitus with diabetic polyneuropathy: Secondary | ICD-10-CM | POA: Diagnosis not present

## 2024-03-09 DIAGNOSIS — N1832 Chronic kidney disease, stage 3b: Secondary | ICD-10-CM | POA: Diagnosis not present

## 2024-03-10 ENCOUNTER — Ambulatory Visit: Payer: Self-pay | Admitting: Family Medicine

## 2024-03-10 NOTE — Progress Notes (Signed)
 See my chart note.

## 2024-03-12 DIAGNOSIS — E1122 Type 2 diabetes mellitus with diabetic chronic kidney disease: Secondary | ICD-10-CM | POA: Diagnosis not present

## 2024-03-12 DIAGNOSIS — E1142 Type 2 diabetes mellitus with diabetic polyneuropathy: Secondary | ICD-10-CM | POA: Diagnosis not present

## 2024-03-12 DIAGNOSIS — N3080 Other cystitis without hematuria: Secondary | ICD-10-CM | POA: Diagnosis not present

## 2024-03-12 DIAGNOSIS — N1832 Chronic kidney disease, stage 3b: Secondary | ICD-10-CM | POA: Diagnosis not present

## 2024-03-12 DIAGNOSIS — N179 Acute kidney failure, unspecified: Secondary | ICD-10-CM | POA: Diagnosis not present

## 2024-03-14 DIAGNOSIS — N1832 Chronic kidney disease, stage 3b: Secondary | ICD-10-CM | POA: Diagnosis not present

## 2024-03-14 DIAGNOSIS — N179 Acute kidney failure, unspecified: Secondary | ICD-10-CM | POA: Diagnosis not present

## 2024-03-14 DIAGNOSIS — N3080 Other cystitis without hematuria: Secondary | ICD-10-CM | POA: Diagnosis not present

## 2024-03-14 DIAGNOSIS — E1122 Type 2 diabetes mellitus with diabetic chronic kidney disease: Secondary | ICD-10-CM | POA: Diagnosis not present

## 2024-03-14 DIAGNOSIS — E1142 Type 2 diabetes mellitus with diabetic polyneuropathy: Secondary | ICD-10-CM | POA: Diagnosis not present

## 2024-03-15 ENCOUNTER — Encounter: Payer: Self-pay | Admitting: *Deleted

## 2024-03-15 ENCOUNTER — Telehealth: Payer: Self-pay | Admitting: *Deleted

## 2024-03-15 DIAGNOSIS — E1142 Type 2 diabetes mellitus with diabetic polyneuropathy: Secondary | ICD-10-CM | POA: Diagnosis not present

## 2024-03-15 DIAGNOSIS — N3080 Other cystitis without hematuria: Secondary | ICD-10-CM | POA: Diagnosis not present

## 2024-03-15 DIAGNOSIS — N1832 Chronic kidney disease, stage 3b: Secondary | ICD-10-CM | POA: Diagnosis not present

## 2024-03-15 DIAGNOSIS — E1122 Type 2 diabetes mellitus with diabetic chronic kidney disease: Secondary | ICD-10-CM | POA: Diagnosis not present

## 2024-03-15 DIAGNOSIS — N179 Acute kidney failure, unspecified: Secondary | ICD-10-CM | POA: Diagnosis not present

## 2024-03-19 ENCOUNTER — Encounter: Payer: Self-pay | Admitting: *Deleted

## 2024-03-19 ENCOUNTER — Telehealth: Payer: Self-pay | Admitting: *Deleted

## 2024-03-20 DIAGNOSIS — E1122 Type 2 diabetes mellitus with diabetic chronic kidney disease: Secondary | ICD-10-CM | POA: Diagnosis not present

## 2024-03-20 DIAGNOSIS — E1142 Type 2 diabetes mellitus with diabetic polyneuropathy: Secondary | ICD-10-CM | POA: Diagnosis not present

## 2024-03-20 DIAGNOSIS — N3080 Other cystitis without hematuria: Secondary | ICD-10-CM | POA: Diagnosis not present

## 2024-03-20 DIAGNOSIS — N1832 Chronic kidney disease, stage 3b: Secondary | ICD-10-CM | POA: Diagnosis not present

## 2024-03-20 DIAGNOSIS — N179 Acute kidney failure, unspecified: Secondary | ICD-10-CM | POA: Diagnosis not present

## 2024-03-26 DIAGNOSIS — N179 Acute kidney failure, unspecified: Secondary | ICD-10-CM | POA: Diagnosis not present

## 2024-03-26 DIAGNOSIS — N1832 Chronic kidney disease, stage 3b: Secondary | ICD-10-CM | POA: Diagnosis not present

## 2024-03-26 DIAGNOSIS — E1122 Type 2 diabetes mellitus with diabetic chronic kidney disease: Secondary | ICD-10-CM | POA: Diagnosis not present

## 2024-03-26 DIAGNOSIS — E1142 Type 2 diabetes mellitus with diabetic polyneuropathy: Secondary | ICD-10-CM | POA: Diagnosis not present

## 2024-03-26 DIAGNOSIS — N3080 Other cystitis without hematuria: Secondary | ICD-10-CM | POA: Diagnosis not present

## 2024-03-27 ENCOUNTER — Other Ambulatory Visit: Payer: Self-pay | Admitting: Family Medicine

## 2024-03-27 MED ORDER — FUROSEMIDE 40 MG PO TABS: 40.0000 mg | ORAL_TABLET | Freq: Every day | ORAL | 3 refills | Status: DC

## 2024-03-27 NOTE — Telephone Encounter (Signed)
 Copied from CRM [id number]. Topic: Clinical - Medication Refill >> Mar 27, 2024 10:05 AM Macario HERO wrote: Medication: furosemide  (LASIX ) 40 MG tablet [[id number]]  Has the patient contacted their pharmacy? Yes (Agent: If no, request that the patient contact the pharmacy for the refill. If patient does not wish to contact the pharmacy document the reason why and proceed with request.) (Agent: If yes, when and what did the pharmacy advise?)  This is the patient's preferred pharmacy:  [facility] [place], [place] - [address] [place] [address] Phone: [phone] Fax: [phone]  [facility] - [place], [place] - [address] [place] [address] Phone: [phone] Fax: [phone]  [facility] [address] [place] [address] Phone: [phone] Fax: [phone]  Is this the correct pharmacy for this prescription? Yes If no, delete pharmacy and type the correct one.   Has the prescription been filled recently? Yes  Is the patient out of the medication? Yes  Has the patient been seen for an appointment in the last year OR does the patient have an upcoming appointment? Yes  Can we respond through MyChart? Yes  Agent: Please be advised that Rx refills may take up to 3 business days. We ask that you follow-up with your pharmacy.

## 2024-03-27 NOTE — Telephone Encounter (Signed)
 03/08/2024 LOV  02/16/2024 fill date  60/0 refills

## 2024-03-28 ENCOUNTER — Telehealth: Payer: Self-pay

## 2024-03-28 DIAGNOSIS — E1142 Type 2 diabetes mellitus with diabetic polyneuropathy: Secondary | ICD-10-CM | POA: Diagnosis not present

## 2024-03-28 DIAGNOSIS — E1122 Type 2 diabetes mellitus with diabetic chronic kidney disease: Secondary | ICD-10-CM | POA: Diagnosis not present

## 2024-03-28 DIAGNOSIS — N179 Acute kidney failure, unspecified: Secondary | ICD-10-CM | POA: Diagnosis not present

## 2024-03-28 DIAGNOSIS — N3080 Other cystitis without hematuria: Secondary | ICD-10-CM | POA: Diagnosis not present

## 2024-03-28 DIAGNOSIS — N1832 Chronic kidney disease, stage 3b: Secondary | ICD-10-CM | POA: Diagnosis not present

## 2024-03-28 NOTE — Telephone Encounter (Signed)
 Copied from CRM #[id number]. Topic: Referral - Question >> Mar 27, 2024  3:05 PM Jayma L wrote: Reason for CRM: michelle with [facility] called asking for verbal orders to be added for OT, this is needed 1 time a week for 3 weeks and please callback michelle at [phone].  Rosaline has been contacted to move forward

## 2024-03-28 NOTE — Telephone Encounter (Signed)
 Copied from CRM [id number]. Topic: General - Other >> Mar 28, 2024  8:51 AM Jasmin G wrote: Reason for CRM: Ms. Rosaline, Occupational Therapist, called to follow up on verbal orders needed from PCP about pt receiving OT once per week for 3 weeks, she states that she called on Monday but has not received any info back, please call her at [phone], it's okay to leave a message  LVM for Rosaline to move forward with verbal orders that are requested.

## 2024-03-29 ENCOUNTER — Other Ambulatory Visit: Payer: Self-pay | Admitting: Family Medicine

## 2024-04-05 DIAGNOSIS — G309 Alzheimer's disease, unspecified: Secondary | ICD-10-CM | POA: Diagnosis not present

## 2024-04-05 DIAGNOSIS — N1832 Chronic kidney disease, stage 3b: Secondary | ICD-10-CM | POA: Diagnosis not present

## 2024-04-05 DIAGNOSIS — E1122 Type 2 diabetes mellitus with diabetic chronic kidney disease: Secondary | ICD-10-CM | POA: Diagnosis not present

## 2024-04-05 DIAGNOSIS — L89152 Pressure ulcer of sacral region, stage 2: Secondary | ICD-10-CM | POA: Diagnosis not present

## 2024-04-05 DIAGNOSIS — E1142 Type 2 diabetes mellitus with diabetic polyneuropathy: Secondary | ICD-10-CM | POA: Diagnosis not present

## 2024-04-06 DIAGNOSIS — L89152 Pressure ulcer of sacral region, stage 2: Secondary | ICD-10-CM | POA: Diagnosis not present

## 2024-04-06 DIAGNOSIS — N1832 Chronic kidney disease, stage 3b: Secondary | ICD-10-CM | POA: Diagnosis not present

## 2024-04-06 DIAGNOSIS — E1142 Type 2 diabetes mellitus with diabetic polyneuropathy: Secondary | ICD-10-CM | POA: Diagnosis not present

## 2024-04-06 DIAGNOSIS — E1122 Type 2 diabetes mellitus with diabetic chronic kidney disease: Secondary | ICD-10-CM | POA: Diagnosis not present

## 2024-04-06 DIAGNOSIS — G309 Alzheimer's disease, unspecified: Secondary | ICD-10-CM | POA: Diagnosis not present

## 2024-04-09 ENCOUNTER — Telehealth: Payer: Self-pay

## 2024-04-09 NOTE — Telephone Encounter (Signed)
 Copied from CRM [id number]. Topic: Clinical - Home Health Verbal Orders >> Apr 09, 2024  9:32 AM Ole G wrote: Caller/Agency: Monique PT w/[facility] Callback Number: [phone] Secured Line Service Requested: Physical Therapy Frequency: Recert / Continuation 1w2  Any new concerns about the patient? No  Spoke with Monique PT with [facility] to move forward with verbal orders

## 2024-04-10 ENCOUNTER — Telehealth: Payer: Self-pay

## 2024-04-10 NOTE — Addendum Note (Signed)
 Addended by: VICCI SELLER A on: 04/10/2024 04:03 PM   Modules accepted: Orders

## 2024-04-10 NOTE — Progress Notes (Signed)
 Remote pacemaker transmission.

## 2024-04-10 NOTE — Telephone Encounter (Signed)
 Copied from CRM #[id number]. Topic: Clinical - Home Health Verbal Orders >> Apr 10, 2024  3:14 PM Franky GRADE wrote: Caller/Agency: alfonso / [facility] Callback Number: [phone] Service Requested: Skilled Nursing for stage 2 pressure ulcer Frequency: Up to Dr.Camile  Any new concerns about the patient? No  alfonso wanted to know is there any specific wound care instruction that you wanted to give.

## 2024-04-11 NOTE — Telephone Encounter (Signed)
 Spoke with andrea and gave her recommendations

## 2024-04-17 DIAGNOSIS — E1122 Type 2 diabetes mellitus with diabetic chronic kidney disease: Secondary | ICD-10-CM | POA: Diagnosis not present

## 2024-04-18 DIAGNOSIS — L89152 Pressure ulcer of sacral region, stage 2: Secondary | ICD-10-CM | POA: Diagnosis not present

## 2024-04-18 DIAGNOSIS — E1122 Type 2 diabetes mellitus with diabetic chronic kidney disease: Secondary | ICD-10-CM | POA: Diagnosis not present

## 2024-04-18 DIAGNOSIS — G309 Alzheimer's disease, unspecified: Secondary | ICD-10-CM | POA: Diagnosis not present

## 2024-04-18 DIAGNOSIS — N1832 Chronic kidney disease, stage 3b: Secondary | ICD-10-CM | POA: Diagnosis not present

## 2024-04-26 ENCOUNTER — Telehealth: Payer: Self-pay | Admitting: Family Medicine

## 2024-04-26 DIAGNOSIS — E1122 Type 2 diabetes mellitus with diabetic chronic kidney disease: Secondary | ICD-10-CM | POA: Diagnosis not present

## 2024-04-26 NOTE — Telephone Encounter (Signed)
 [facility] faxed Home Health Certificate (Order [place] [id number]), to be filled out by provider. Patient requested to send it back via Fax within ASAP. Document is located in providers tray at front office.Please advise at [phone].

## 2024-04-26 NOTE — Telephone Encounter (Signed)
 Form has been placed in provider box to complete

## 2024-05-01 ENCOUNTER — Other Ambulatory Visit: Payer: Self-pay | Admitting: Family Medicine

## 2024-05-01 DIAGNOSIS — F0284 Dementia in other diseases classified elsewhere, unspecified severity, with anxiety: Secondary | ICD-10-CM

## 2024-05-01 DIAGNOSIS — N3080 Other cystitis without hematuria: Secondary | ICD-10-CM | POA: Diagnosis not present

## 2024-05-01 DIAGNOSIS — F39 Unspecified mood [affective] disorder: Secondary | ICD-10-CM

## 2024-05-01 DIAGNOSIS — E1122 Type 2 diabetes mellitus with diabetic chronic kidney disease: Secondary | ICD-10-CM | POA: Diagnosis not present

## 2024-05-01 DIAGNOSIS — F0283 Dementia in other diseases classified elsewhere, unspecified severity, with mood disturbance: Secondary | ICD-10-CM

## 2024-05-01 DIAGNOSIS — E1142 Type 2 diabetes mellitus with diabetic polyneuropathy: Secondary | ICD-10-CM | POA: Diagnosis not present

## 2024-05-01 DIAGNOSIS — G309 Alzheimer's disease, unspecified: Secondary | ICD-10-CM | POA: Diagnosis not present

## 2024-05-01 DIAGNOSIS — F411 Generalized anxiety disorder: Secondary | ICD-10-CM

## 2024-05-01 DIAGNOSIS — I10 Essential (primary) hypertension: Secondary | ICD-10-CM | POA: Diagnosis not present

## 2024-05-01 DIAGNOSIS — N1832 Chronic kidney disease, stage 3b: Secondary | ICD-10-CM | POA: Diagnosis not present

## 2024-05-01 DIAGNOSIS — N179 Acute kidney failure, unspecified: Secondary | ICD-10-CM | POA: Diagnosis not present

## 2024-05-01 DIAGNOSIS — L89152 Pressure ulcer of sacral region, stage 2: Secondary | ICD-10-CM | POA: Diagnosis not present

## 2024-05-08 ENCOUNTER — Ambulatory Visit

## 2024-05-08 DIAGNOSIS — I442 Atrioventricular block, complete: Secondary | ICD-10-CM | POA: Diagnosis not present

## 2024-05-09 ENCOUNTER — Ambulatory Visit: Payer: Self-pay | Admitting: Cardiology

## 2024-05-09 DIAGNOSIS — L89152 Pressure ulcer of sacral region, stage 2: Secondary | ICD-10-CM | POA: Diagnosis not present

## 2024-05-09 DIAGNOSIS — N1832 Chronic kidney disease, stage 3b: Secondary | ICD-10-CM | POA: Diagnosis not present

## 2024-05-09 DIAGNOSIS — E1142 Type 2 diabetes mellitus with diabetic polyneuropathy: Secondary | ICD-10-CM | POA: Diagnosis not present

## 2024-05-09 DIAGNOSIS — E1122 Type 2 diabetes mellitus with diabetic chronic kidney disease: Secondary | ICD-10-CM | POA: Diagnosis not present

## 2024-05-09 DIAGNOSIS — G309 Alzheimer's disease, unspecified: Secondary | ICD-10-CM | POA: Diagnosis not present

## 2024-05-09 LAB — CUP PACEART REMOTE DEVICE CHECK
Battery Remaining Longevity: 20 mo
Battery Remaining Percentage: 17 %
Battery Voltage: 2.87 V
Brady Statistic AP VP Percent: 29 %
Brady Statistic AP VS Percent: 1 %
Brady Statistic AS VP Percent: 71 %
Brady Statistic AS VS Percent: 1 %
Brady Statistic RA Percent Paced: 29 %
Brady Statistic RV Percent Paced: 99 %
Date Time Interrogation Session: 20250909030442
Implantable Lead Connection Status: 753985
Implantable Lead Connection Status: 753985
Implantable Lead Implant Date: 20161221
Implantable Lead Implant Date: 20161221
Implantable Lead Location: 753860
Implantable Lead Location: 753860
Implantable Pulse Generator Implant Date: 20161221
Lead Channel Impedance Value: 390 Ohm
Lead Channel Impedance Value: 560 Ohm
Lead Channel Pacing Threshold Amplitude: 0.625 V
Lead Channel Pacing Threshold Amplitude: 0.75 V
Lead Channel Pacing Threshold Pulse Width: 0.5 ms
Lead Channel Pacing Threshold Pulse Width: 0.5 ms
Lead Channel Sensing Intrinsic Amplitude: 11.2 mV
Lead Channel Sensing Intrinsic Amplitude: 2.8 mV
Lead Channel Setting Pacing Amplitude: 0.875
Lead Channel Setting Pacing Amplitude: 1.75 V
Lead Channel Setting Pacing Pulse Width: 0.5 ms
Lead Channel Setting Sensing Sensitivity: 4 mV
Pulse Gen Model: 2240
Pulse Gen Serial Number: 7839181

## 2024-05-10 DIAGNOSIS — N1832 Chronic kidney disease, stage 3b: Secondary | ICD-10-CM | POA: Diagnosis not present

## 2024-05-14 ENCOUNTER — Other Ambulatory Visit: Payer: Self-pay | Admitting: Family Medicine

## 2024-05-14 NOTE — Progress Notes (Unsigned)
  Electrophysiology Office Note:   Date:  05/15/2024  ID:  Krista Clarke, DOB 1940-12-26, MRN [id number]  Primary Cardiologist: Gordy Bergamo, MD Primary Heart Failure: None Electrophysiologist: Cheyan Frees Gladis Norton, MD      History of Present Illness:   Krista Clarke is a 83 y.o. female with h/o complete heart block, hypertension, hyperlipidemia, type 2 diabetes, CKD stage IIIb seen today for  for Electrophysiology evaluation of pacemaker at the request of Gordy Bergamo.    Today, denies symptoms of palpitations, chest pain, dyspnea, orthopnea, PND, lower extremity edema, claudication, dizziness, presyncope, syncope, bleeding, or neurologic sequela. The patient is tolerating medications without difficulties.  Since her pacemaker was implanted, she has done well.  She has had no issues with her pacemaker and has been able to do all of her daily activities with her only limitation being mobility due to leg issues.  Review of systems complete and found to be negative unless listed in HPI.      EP Information / Studies Reviewed:    EKG is not ordered today. EKG from 02/19/2024 reviewed which showed sinus rhythm, ventricular paced      PPM Interrogation-  reviewed in detail today,  See PACEART report.  Device History: Abbott Dual Chamber PPM implanted 12/19/2014 for CHB  Risk Assessment/Calculations:            Physical Exam:   VS:  BP (!) 140/66 (BP Location: Left Arm, Patient Position: Sitting, Cuff Size: Large)   Pulse 64   Ht 5' 3 (1.6 m)   Wt 195 lb (88.5 kg)   LMP  (LMP Unknown)   SpO2 100%   BMI 34.54 kg/m    Wt Readings from Last 3 Encounters:  05/15/24 195 lb (88.5 kg)  03/08/24 196 lb 9.6 oz (89.2 kg)  02/19/24 205 lb 14.6 oz (93.4 kg)     GEN: Well nourished, well developed in no acute distress NECK: No JVD; No carotid bruits CARDIAC: Regular rate and rhythm, no murmurs, rubs, gallops RESPIRATORY:  Clear to auscultation without rales, wheezing or rhonchi  ABDOMEN: Soft,  non-tender, non-distended EXTREMITIES:  No edema; No deformity   ASSESSMENT AND PLAN:    CHB s/p Abbott PPM  Normal PPM function See Elisabeth Art report Yussef Jorge arrange for remote monitoring through device clinic  no changes today  2.  Hypertension: Well-controlled  Disposition:   Follow up with EP APP .  18 months   Signed, Sim Choquette Gladis Norton, MD

## 2024-05-15 ENCOUNTER — Ambulatory Visit: Attending: Cardiology | Admitting: Cardiology

## 2024-05-15 ENCOUNTER — Ambulatory Visit: Payer: Self-pay | Admitting: Cardiology

## 2024-05-15 ENCOUNTER — Encounter: Payer: Self-pay | Admitting: Cardiology

## 2024-05-15 VITALS — BP 140/66 | HR 64 | Ht 63.0 in | Wt 195.0 lb

## 2024-05-15 DIAGNOSIS — G309 Alzheimer's disease, unspecified: Secondary | ICD-10-CM | POA: Diagnosis not present

## 2024-05-15 DIAGNOSIS — E1122 Type 2 diabetes mellitus with diabetic chronic kidney disease: Secondary | ICD-10-CM | POA: Diagnosis not present

## 2024-05-15 DIAGNOSIS — E1142 Type 2 diabetes mellitus with diabetic polyneuropathy: Secondary | ICD-10-CM | POA: Diagnosis not present

## 2024-05-15 DIAGNOSIS — L89152 Pressure ulcer of sacral region, stage 2: Secondary | ICD-10-CM | POA: Diagnosis not present

## 2024-05-15 DIAGNOSIS — I10 Essential (primary) hypertension: Secondary | ICD-10-CM

## 2024-05-15 DIAGNOSIS — I442 Atrioventricular block, complete: Secondary | ICD-10-CM | POA: Diagnosis not present

## 2024-05-15 DIAGNOSIS — N1832 Chronic kidney disease, stage 3b: Secondary | ICD-10-CM | POA: Diagnosis not present

## 2024-05-15 LAB — CUP PACEART INCLINIC DEVICE CHECK
Battery Remaining Longevity: 21 mo
Battery Voltage: 2.87 V
Brady Statistic RA Percent Paced: 28 %
Brady Statistic RV Percent Paced: 99.92 %
Date Time Interrogation Session: 20250916111827
Implantable Lead Connection Status: 753985
Implantable Lead Connection Status: 753985
Implantable Lead Implant Date: 20161221
Implantable Lead Implant Date: 20161221
Implantable Lead Location: 753860
Implantable Lead Location: 753860
Implantable Pulse Generator Implant Date: 20161221
Lead Channel Impedance Value: 400 Ohm
Lead Channel Impedance Value: 625 Ohm
Lead Channel Pacing Threshold Amplitude: 0.625 V
Lead Channel Pacing Threshold Amplitude: 0.75 V
Lead Channel Pacing Threshold Pulse Width: 0.5 ms
Lead Channel Pacing Threshold Pulse Width: 0.5 ms
Lead Channel Sensing Intrinsic Amplitude: 1.7 mV
Lead Channel Sensing Intrinsic Amplitude: 11.2 mV
Lead Channel Setting Pacing Amplitude: 0.875
Lead Channel Setting Pacing Amplitude: 1.75 V
Lead Channel Setting Pacing Pulse Width: 0.5 ms
Lead Channel Setting Sensing Sensitivity: 4 mV
Pulse Gen Model: 2240
Pulse Gen Serial Number: 7839181

## 2024-05-17 DIAGNOSIS — E1122 Type 2 diabetes mellitus with diabetic chronic kidney disease: Secondary | ICD-10-CM | POA: Diagnosis not present

## 2024-05-17 DIAGNOSIS — E1142 Type 2 diabetes mellitus with diabetic polyneuropathy: Secondary | ICD-10-CM | POA: Diagnosis not present

## 2024-05-17 DIAGNOSIS — N1832 Chronic kidney disease, stage 3b: Secondary | ICD-10-CM | POA: Diagnosis not present

## 2024-05-17 DIAGNOSIS — L89152 Pressure ulcer of sacral region, stage 2: Secondary | ICD-10-CM | POA: Diagnosis not present

## 2024-05-17 NOTE — Progress Notes (Signed)
 Remote PPM Transmission

## 2024-05-22 DIAGNOSIS — G309 Alzheimer's disease, unspecified: Secondary | ICD-10-CM | POA: Diagnosis not present

## 2024-05-22 DIAGNOSIS — L89152 Pressure ulcer of sacral region, stage 2: Secondary | ICD-10-CM | POA: Diagnosis not present

## 2024-05-22 DIAGNOSIS — E1142 Type 2 diabetes mellitus with diabetic polyneuropathy: Secondary | ICD-10-CM | POA: Diagnosis not present

## 2024-05-22 DIAGNOSIS — E1122 Type 2 diabetes mellitus with diabetic chronic kidney disease: Secondary | ICD-10-CM | POA: Diagnosis not present

## 2024-05-29 DIAGNOSIS — G309 Alzheimer's disease, unspecified: Secondary | ICD-10-CM | POA: Diagnosis not present

## 2024-05-29 DIAGNOSIS — E1142 Type 2 diabetes mellitus with diabetic polyneuropathy: Secondary | ICD-10-CM | POA: Diagnosis not present

## 2024-05-29 DIAGNOSIS — N1832 Chronic kidney disease, stage 3b: Secondary | ICD-10-CM | POA: Diagnosis not present

## 2024-05-29 DIAGNOSIS — E1122 Type 2 diabetes mellitus with diabetic chronic kidney disease: Secondary | ICD-10-CM | POA: Diagnosis not present

## 2024-05-29 DIAGNOSIS — L89152 Pressure ulcer of sacral region, stage 2: Secondary | ICD-10-CM | POA: Diagnosis not present

## 2024-06-02 ENCOUNTER — Other Ambulatory Visit: Payer: Self-pay | Admitting: Family Medicine

## 2024-06-04 NOTE — Telephone Encounter (Signed)
 03/08/2024 LOV  05/01/2024 fill date  60/0 refills

## 2024-06-09 ENCOUNTER — Emergency Department (HOSPITAL_COMMUNITY)

## 2024-06-09 ENCOUNTER — Other Ambulatory Visit: Payer: Self-pay

## 2024-06-09 ENCOUNTER — Inpatient Hospital Stay (HOSPITAL_COMMUNITY)
Admission: EM | Admit: 2024-06-09 | Discharge: 2024-06-14 | DRG: 683 | Disposition: A | Discharge status: Home Health | Attending: Internal Medicine | Admitting: Internal Medicine

## 2024-06-09 DIAGNOSIS — M160 Bilateral primary osteoarthritis of hip: Secondary | ICD-10-CM | POA: Diagnosis not present

## 2024-06-09 DIAGNOSIS — I442 Atrioventricular block, complete: Secondary | ICD-10-CM | POA: Diagnosis not present

## 2024-06-09 DIAGNOSIS — M5030 Other cervical disc degeneration, unspecified cervical region: Secondary | ICD-10-CM | POA: Diagnosis not present

## 2024-06-09 DIAGNOSIS — K219 Gastro-esophageal reflux disease without esophagitis: Secondary | ICD-10-CM | POA: Diagnosis present

## 2024-06-09 DIAGNOSIS — Z950 Presence of cardiac pacemaker: Secondary | ICD-10-CM

## 2024-06-09 DIAGNOSIS — N136 Pyonephrosis: Secondary | ICD-10-CM | POA: Diagnosis present

## 2024-06-09 DIAGNOSIS — R0989 Other specified symptoms and signs involving the circulatory and respiratory systems: Secondary | ICD-10-CM | POA: Diagnosis not present

## 2024-06-09 DIAGNOSIS — M47816 Spondylosis without myelopathy or radiculopathy, lumbar region: Secondary | ICD-10-CM | POA: Diagnosis not present

## 2024-06-09 DIAGNOSIS — E162 Hypoglycemia, unspecified: Secondary | ICD-10-CM

## 2024-06-09 DIAGNOSIS — E1122 Type 2 diabetes mellitus with diabetic chronic kidney disease: Secondary | ICD-10-CM | POA: Diagnosis present

## 2024-06-09 DIAGNOSIS — R339 Retention of urine, unspecified: Secondary | ICD-10-CM | POA: Diagnosis present

## 2024-06-09 DIAGNOSIS — E119 Type 2 diabetes mellitus without complications: Secondary | ICD-10-CM | POA: Diagnosis not present

## 2024-06-09 DIAGNOSIS — Z7984 Long term (current) use of oral hypoglycemic drugs: Secondary | ICD-10-CM

## 2024-06-09 DIAGNOSIS — F0280 Dementia in other diseases classified elsewhere without behavioral disturbance: Secondary | ICD-10-CM | POA: Diagnosis present

## 2024-06-09 DIAGNOSIS — R630 Anorexia: Secondary | ICD-10-CM | POA: Diagnosis present

## 2024-06-09 DIAGNOSIS — W19XXXA Unspecified fall, initial encounter: Secondary | ICD-10-CM | POA: Diagnosis present

## 2024-06-09 DIAGNOSIS — M8580 Other specified disorders of bone density and structure, unspecified site: Secondary | ICD-10-CM | POA: Diagnosis not present

## 2024-06-09 DIAGNOSIS — E11649 Type 2 diabetes mellitus with hypoglycemia without coma: Secondary | ICD-10-CM | POA: Diagnosis present

## 2024-06-09 DIAGNOSIS — M48061 Spinal stenosis, lumbar region without neurogenic claudication: Secondary | ICD-10-CM | POA: Diagnosis present

## 2024-06-09 DIAGNOSIS — R531 Weakness: Secondary | ICD-10-CM | POA: Diagnosis present

## 2024-06-09 DIAGNOSIS — B9620 Unspecified Escherichia coli [E. coli] as the cause of diseases classified elsewhere: Secondary | ICD-10-CM | POA: Diagnosis present

## 2024-06-09 DIAGNOSIS — N1832 Chronic kidney disease, stage 3b: Secondary | ICD-10-CM | POA: Diagnosis present

## 2024-06-09 DIAGNOSIS — Z833 Family history of diabetes mellitus: Secondary | ICD-10-CM

## 2024-06-09 DIAGNOSIS — Z66 Do not resuscitate: Secondary | ICD-10-CM | POA: Diagnosis present

## 2024-06-09 DIAGNOSIS — E7800 Pure hypercholesterolemia, unspecified: Secondary | ICD-10-CM | POA: Diagnosis present

## 2024-06-09 DIAGNOSIS — I700 Atherosclerosis of aorta: Secondary | ICD-10-CM | POA: Diagnosis not present

## 2024-06-09 DIAGNOSIS — Z6834 Body mass index (BMI) 34.0-34.9, adult: Secondary | ICD-10-CM

## 2024-06-09 DIAGNOSIS — E1142 Type 2 diabetes mellitus with diabetic polyneuropathy: Secondary | ICD-10-CM | POA: Diagnosis present

## 2024-06-09 DIAGNOSIS — N3289 Other specified disorders of bladder: Secondary | ICD-10-CM | POA: Diagnosis present

## 2024-06-09 DIAGNOSIS — Y92009 Unspecified place in unspecified non-institutional (private) residence as the place of occurrence of the external cause: Secondary | ICD-10-CM | POA: Diagnosis not present

## 2024-06-09 DIAGNOSIS — I130 Hypertensive heart and chronic kidney disease with heart failure and stage 1 through stage 4 chronic kidney disease, or unspecified chronic kidney disease: Secondary | ICD-10-CM | POA: Diagnosis present

## 2024-06-09 DIAGNOSIS — D631 Anemia in chronic kidney disease: Secondary | ICD-10-CM | POA: Diagnosis present

## 2024-06-09 DIAGNOSIS — M109 Gout, unspecified: Secondary | ICD-10-CM | POA: Diagnosis present

## 2024-06-09 DIAGNOSIS — Z90710 Acquired absence of both cervix and uterus: Secondary | ICD-10-CM

## 2024-06-09 DIAGNOSIS — Z043 Encounter for examination and observation following other accident: Secondary | ICD-10-CM | POA: Diagnosis not present

## 2024-06-09 DIAGNOSIS — J9811 Atelectasis: Secondary | ICD-10-CM | POA: Diagnosis present

## 2024-06-09 DIAGNOSIS — M5442 Lumbago with sciatica, left side: Secondary | ICD-10-CM | POA: Diagnosis present

## 2024-06-09 DIAGNOSIS — N390 Urinary tract infection, site not specified: Principal | ICD-10-CM | POA: Diagnosis present

## 2024-06-09 DIAGNOSIS — E66811 Obesity, class 1: Secondary | ICD-10-CM | POA: Diagnosis present

## 2024-06-09 DIAGNOSIS — I5031 Acute diastolic (congestive) heart failure: Secondary | ICD-10-CM | POA: Diagnosis not present

## 2024-06-09 DIAGNOSIS — E1121 Type 2 diabetes mellitus with diabetic nephropathy: Secondary | ICD-10-CM | POA: Diagnosis present

## 2024-06-09 DIAGNOSIS — G894 Chronic pain syndrome: Secondary | ICD-10-CM | POA: Diagnosis present

## 2024-06-09 DIAGNOSIS — I10 Essential (primary) hypertension: Secondary | ICD-10-CM | POA: Diagnosis present

## 2024-06-09 DIAGNOSIS — I5033 Acute on chronic diastolic (congestive) heart failure: Secondary | ICD-10-CM | POA: Diagnosis present

## 2024-06-09 DIAGNOSIS — Z803 Family history of malignant neoplasm of breast: Secondary | ICD-10-CM

## 2024-06-09 DIAGNOSIS — G309 Alzheimer's disease, unspecified: Secondary | ICD-10-CM | POA: Diagnosis present

## 2024-06-09 DIAGNOSIS — M79605 Pain in left leg: Secondary | ICD-10-CM | POA: Diagnosis present

## 2024-06-09 DIAGNOSIS — R5383 Other fatigue: Secondary | ICD-10-CM | POA: Diagnosis not present

## 2024-06-09 DIAGNOSIS — N179 Acute kidney failure, unspecified: Principal | ICD-10-CM | POA: Diagnosis present

## 2024-06-09 DIAGNOSIS — Z8249 Family history of ischemic heart disease and other diseases of the circulatory system: Secondary | ICD-10-CM | POA: Diagnosis not present

## 2024-06-09 DIAGNOSIS — M5441 Lumbago with sciatica, right side: Secondary | ICD-10-CM | POA: Diagnosis present

## 2024-06-09 DIAGNOSIS — S199XXA Unspecified injury of neck, initial encounter: Secondary | ICD-10-CM | POA: Diagnosis not present

## 2024-06-09 DIAGNOSIS — Z87440 Personal history of urinary (tract) infections: Secondary | ICD-10-CM

## 2024-06-09 DIAGNOSIS — S0990XA Unspecified injury of head, initial encounter: Secondary | ICD-10-CM | POA: Diagnosis not present

## 2024-06-09 DIAGNOSIS — Z79899 Other long term (current) drug therapy: Secondary | ICD-10-CM

## 2024-06-09 DIAGNOSIS — I459 Conduction disorder, unspecified: Secondary | ICD-10-CM | POA: Diagnosis present

## 2024-06-09 LAB — PRO BRAIN NATRIURETIC PEPTIDE: Pro Brain Natriuretic Peptide: 2292 pg/mL — ABNORMAL HIGH (ref <300.0)

## 2024-06-09 LAB — CBC WITH DIFFERENTIAL/PLATELET
Abs Immature Granulocytes: 0.13 K/uL — ABNORMAL HIGH (ref 0.00–0.07)
Basophils Absolute: 0 K/uL (ref 0.0–0.1)
Basophils Relative: 0 %
Eosinophils Absolute: 0 K/uL (ref 0.0–0.5)
Eosinophils Relative: 0 %
HCT: 34.8 % — ABNORMAL LOW (ref 36.0–46.0)
Hemoglobin: 10.4 g/dL — ABNORMAL LOW (ref 12.0–15.0)
Immature Granulocytes: 1 %
Lymphocytes Relative: 8 %
Lymphs Abs: 0.9 K/uL (ref 0.7–4.0)
MCH: 24.2 pg — ABNORMAL LOW (ref 26.0–34.0)
MCHC: 29.9 g/dL — ABNORMAL LOW (ref 30.0–36.0)
MCV: 80.9 fL (ref 80.0–100.0)
Monocytes Absolute: 1.3 K/uL — ABNORMAL HIGH (ref 0.1–1.0)
Monocytes Relative: 11 %
Neutro Abs: 8.8 K/uL — ABNORMAL HIGH (ref 1.7–7.7)
Neutrophils Relative %: 80 %
Platelets: 239 K/uL (ref 150–400)
RBC: 4.3 MIL/uL (ref 3.87–5.11)
RDW: 16.8 % — ABNORMAL HIGH (ref 11.5–15.5)
WBC: 11.1 K/uL — ABNORMAL HIGH (ref 4.0–10.5)
nRBC: 0 % (ref 0.0–0.2)

## 2024-06-09 LAB — BASIC METABOLIC PANEL WITH GFR
Anion gap: 12 (ref 5–15)
BUN: 79 mg/dL — ABNORMAL HIGH (ref 8–23)
CO2: 26 mmol/L (ref 22–32)
Calcium: 9.2 mg/dL (ref 8.9–10.3)
Chloride: 99 mmol/L (ref 98–111)
Creatinine, Ser: 3.01 mg/dL — ABNORMAL HIGH (ref 0.44–1.00)
GFR, Estimated: 15 mL/min — ABNORMAL LOW (ref >60)
Glucose, Bld: 57 mg/dL — ABNORMAL LOW (ref 70–99)
Potassium: 4.7 mmol/L (ref 3.5–5.1)
Sodium: 137 mmol/L (ref 135–145)

## 2024-06-09 LAB — HEPATIC FUNCTION PANEL
ALT: 20 U/L (ref 0–44)
AST: 44 U/L — ABNORMAL HIGH (ref 15–41)
Albumin: 3.4 g/dL — ABNORMAL LOW (ref 3.5–5.0)
Alkaline Phosphatase: 94 U/L (ref 38–126)
Bilirubin, Direct: 0.3 mg/dL — ABNORMAL HIGH (ref 0.0–0.2)
Indirect Bilirubin: 0.3 mg/dL (ref 0.3–0.9)
Total Bilirubin: 0.6 mg/dL (ref 0.0–1.2)
Total Protein: 7.1 g/dL (ref 6.5–8.1)

## 2024-06-09 LAB — PROTIME-INR
INR: 1 (ref 0.8–1.2)
Prothrombin Time: 13.5 s (ref 11.4–15.2)

## 2024-06-09 LAB — URINALYSIS, ROUTINE W REFLEX MICROSCOPIC
Bilirubin Urine: NEGATIVE
Glucose, UA: NEGATIVE mg/dL
Hgb urine dipstick: NEGATIVE
Ketones, ur: NEGATIVE mg/dL
Nitrite: NEGATIVE
Protein, ur: 30 mg/dL — AB
Specific Gravity, Urine: 1.009 (ref 1.005–1.030)
WBC, UA: >50 WBC/hpf (ref 0–5)
pH: 5 (ref 5.0–8.0)

## 2024-06-09 LAB — CK: Total CK: 366 U/L — ABNORMAL HIGH (ref 38–234)

## 2024-06-09 LAB — TROPONIN T, HIGH SENSITIVITY
Troponin T High Sensitivity: 76 ng/L — ABNORMAL HIGH (ref 0–19)
Troponin T High Sensitivity: 73 ng/L — ABNORMAL HIGH (ref 0–19)

## 2024-06-09 LAB — MAGNESIUM: Magnesium: 3.6 mg/dL — ABNORMAL HIGH (ref 1.7–2.4)

## 2024-06-09 LAB — GLUCOSE, CAPILLARY: Glucose-Capillary: 92 mg/dL (ref 70–99)

## 2024-06-09 LAB — CBG MONITORING, ED: Glucose-Capillary: 148 mg/dL — ABNORMAL HIGH (ref 70–99)

## 2024-06-09 LAB — I-STAT CG4 LACTIC ACID, ED: Lactic Acid, Venous: 1.2 mmol/L (ref 0.5–1.9)

## 2024-06-09 MED ORDER — AMLODIPINE BESYLATE 5 MG PO TABS
5.0000 mg | ORAL_TABLET | Freq: Every day | ORAL | Status: DC
  Administered 2024-06-10 – 2024-06-14 (×5): 5 mg via ORAL
  Filled 2024-06-09 (×5): qty 1

## 2024-06-09 MED ORDER — PANTOPRAZOLE SODIUM 40 MG PO TBEC
40.0000 mg | DELAYED_RELEASE_TABLET | Freq: Every day | ORAL | Status: DC
  Administered 2024-06-10 – 2024-06-14 (×5): 40 mg via ORAL
  Filled 2024-06-09 (×5): qty 1

## 2024-06-09 MED ORDER — HEPARIN SODIUM (PORCINE) 5000 UNIT/ML IJ SOLN
5000.0000 [IU] | Freq: Three times a day (TID) | INTRAMUSCULAR | Status: DC
  Administered 2024-06-10 – 2024-06-14 (×13): 5000 [IU] via SUBCUTANEOUS
  Filled 2024-06-09 (×13): qty 1

## 2024-06-09 MED ORDER — METOPROLOL SUCCINATE ER 25 MG PO TB24
25.0000 mg | ORAL_TABLET | Freq: Every day | ORAL | Status: DC
  Administered 2024-06-09 – 2024-06-14 (×6): 25 mg via ORAL
  Filled 2024-06-09 (×6): qty 1

## 2024-06-09 MED ORDER — HYDROCODONE-ACETAMINOPHEN 5-325 MG PO TABS
1.0000 | ORAL_TABLET | Freq: Two times a day (BID) | ORAL | Status: DC
Start: 2024-06-09 — End: 2024-06-11
  Administered 2024-06-09 – 2024-06-11 (×4): 1 via ORAL
  Filled 2024-06-09 (×4): qty 1

## 2024-06-09 MED ORDER — SODIUM CHLORIDE 0.9 % IV SOLN
2.0000 g | Freq: Once | INTRAVENOUS | Status: AC
  Administered 2024-06-09: 2 g via INTRAVENOUS
  Filled 2024-06-09: qty 12.5

## 2024-06-09 MED ORDER — SODIUM CHLORIDE 0.9 % IV SOLN
1.0000 g | INTRAVENOUS | Status: DC
  Administered 2024-06-10 – 2024-06-11 (×2): 1 g via INTRAVENOUS
  Filled 2024-06-09 (×2): qty 10

## 2024-06-09 MED ORDER — ATORVASTATIN CALCIUM 20 MG PO TABS
40.0000 mg | ORAL_TABLET | Freq: Every day | ORAL | Status: DC
  Administered 2024-06-10 – 2024-06-13 (×4): 40 mg via ORAL
  Filled 2024-06-09 (×4): qty 2

## 2024-06-09 MED ORDER — DEXTROSE 10 % IV BOLUS
250.0000 mL | Freq: Once | INTRAVENOUS | Status: AC
  Administered 2024-06-09: 250 mL via INTRAVENOUS

## 2024-06-09 MED ORDER — LACTATED RINGERS IV BOLUS
1000.0000 mL | Freq: Once | INTRAVENOUS | Status: AC
Start: 2024-06-09 — End: 2024-06-09
  Administered 2024-06-09: 1000 mL via INTRAVENOUS

## 2024-06-09 MED ORDER — OXYCODONE-ACETAMINOPHEN 5-325 MG PO TABS
1.0000 | ORAL_TABLET | Freq: Once | ORAL | Status: AC
  Administered 2024-06-09: 1 via ORAL
  Filled 2024-06-09: qty 1

## 2024-06-09 MED ORDER — ALLOPURINOL 100 MG PO TABS
100.0000 mg | ORAL_TABLET | Freq: Every morning | ORAL | Status: DC
  Administered 2024-06-10 – 2024-06-14 (×5): 100 mg via ORAL
  Filled 2024-06-09 (×4): qty 1

## 2024-06-09 NOTE — ED Notes (Signed)
 Pt to CT

## 2024-06-09 NOTE — Progress Notes (Signed)
 ED Pharmacy Antibiotic Sign Off An antibiotic consult was received from an ED provider for Cefepime  per pharmacy dosing for UTI. A chart review was completed to assess appropriateness.   The following one time order(s) were placed:  Cefepime  2gm IV  Further antibiotic and/or antibiotic pharmacy consults should be ordered by the admitting provider if indicated.   Thank you for allowing pharmacy to be a part of this patient's care.   Rosaline Millet, [facility]  Clinical Pharmacist 06/09/24 9:38 PM

## 2024-06-09 NOTE — ED Notes (Signed)
 Pt return from CT.

## 2024-06-09 NOTE — ED Triage Notes (Signed)
 Pt. Arrived from home via EMS. A&O x 4. States she fell 3 days ago & has been feeling weak/lethargic ever since. C/O generalized pain & generalized weakness. Poor appetite. Last meal was yesterday.

## 2024-06-09 NOTE — ED Provider Notes (Signed)
 [place] [facility] AT [facility] Provider Note   CSN: [id number] Arrival date & time: 06/09/24  1502     Patient presents with: No chief complaint on file.   Krista Clarke is a 83 y.o. female.   HPI Patient presents for malaise.  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  She lives at home with her daughters.  Happened 3 days ago, she was ambulating with her rollator walker.  She had a mechanical fall, described as sliding down to the ground that occurred 3 days ago.  Since that time, she has not been ambulating.  She has had poor p.o. intake.  She has been complaining of diffuse pain.  She required increased assistance with movement and transfers today.  Currently, patient endorses pain all over but does specify area around L1 but is painful.    Prior to Admission medications   Medication Sig Start Date End Date Taking? Authorizing Provider  Accu-Chek Softclix Lancets lancets Use as instructed 03/08/24   Jodie Lavern LITTIE, MD  allopurinol  (ZYLOPRIM ) 100 MG tablet TAKE 1 TABLET BY MOUTH EVERY MORNING 09/15/23   Jodie Lavern LITTIE, MD  amLODipine  (NORVASC ) 5 MG tablet TAKE 1 TABLET BY MOUTH EVERY DAY 12/08/23   Jodie Lavern LITTIE, MD  ascorbic acid  (VITAMIN C ) 500 MG tablet TAKE 1 TABLET BY MOUTH EVERY DAY 11/08/23   Jodie Lavern LITTIE, MD  ASPIRIN  LOW DOSE 81 MG chewable tablet CHEW ONE TABLET IN MOUTH EVERY DAY 02/03/24   Jodie Lavern LITTIE, MD  atorvastatin  (LIPITOR) 40 MG tablet TAKE 1 TABLET BY MOUTH AT BEDTIME 05/14/24   Jodie Lavern LITTIE, MD  Blood Glucose Monitoring Suppl (ACCU-CHEK GUIDE ME) w/Device KIT USE AS DIRECTED 03/08/24   Jodie Lavern LITTIE, MD  cyanocobalamin  (VITAMIN B12) 1000 MCG tablet TAKE 1 TABLET BY MOUTH EVERY DAY 12/08/23   Jodie Lavern LITTIE, MD  dapagliflozin  propanediol (FARXIGA ) 10 MG TABS tablet Take 1 tablet (10 mg total) by mouth daily. 02/07/23   Jodie Lavern LITTIE, MD   diclofenac  Sodium (VOLTAREN ) 1 % GEL Apply 4 GRAMS topically 4 TIMES daily AS NEEDED FOR PAIN 05/24/23   Jodie Lavern LITTIE, MD  FEROSUL 325 (65 Fe) MG tablet TAKE 1 TABLET BY MOUTH 2 TIMES DAILY 02/03/24   Jodie Lavern LITTIE, MD  furosemide  (LASIX ) 40 MG tablet Take 1 tablet (40 mg total) by mouth daily. 03/27/24   Jodie Lavern LITTIE, MD  glucose blood (ACCU-CHEK GUIDE TEST) test strip Use as instructed 03/08/24   Jodie Lavern LITTIE, MD  HYDROcodone -acetaminophen  (NORCO/VICODIN) 5-325 MG tablet TAKE 1 TABLET BY MOUTH 2 TIMES DAILY 06/04/24   Jodie Lavern LITTIE, MD  losartan  (COZAAR ) 100 MG tablet TAKE 1 TABLET BY MOUTH EVERY DAY 09/15/23   Jodie Lavern LITTIE, MD  Lutein  20 MG CAPS TAKE 1 CAPSULE BY MOUTH EVERY DAY 11/08/23   Jodie Lavern LITTIE, MD  magnesium  oxide (MAG-OX) 400 (240 Mg) MG tablet TAKE 1 TABLET BY MOUTH ONCE DAILY 03/29/24   Jodie Lavern LITTIE, MD  meclizine  (ANTIVERT ) 12.5 MG tablet Take 1 tablet (12.5 mg total) by mouth 2 (two) times daily as needed for dizziness. 08/26/20   Jodie Lavern LITTIE, MD  metoprolol  succinate (TOPROL -XL) 25 MG 24 hr tablet TAKE 1 TABLET BY MOUTH EVERY DAY 12/08/23   Jodie Lavern LITTIE, MD  omeprazole  (PRILOSEC) 20 MG capsule TAKE 1 CAPSULE BY MOUTH EVERY DAY BEFORE BREAKFAST 10/12/23  [name], MD  triamcinolone  (KENALOG ) 0.1 % Apply 1 application topically 2 (two) times daily. For 2 weeks, then as needed 10/17/20   [name], MD    Allergies: Prednisone    Review of Systems  Constitutional:  Positive for activity change and appetite change.  Musculoskeletal:  Positive for arthralgias, back pain and myalgias.  All other systems reviewed and are negative.   Updated Vital Signs BP (!) 149/58   Pulse 72   Temp 98.4 F (36.9 C) (Oral)   Resp 16   Ht 5' 1 (1.549 m)   Wt 83.5 kg   LMP  (LMP Unknown)   SpO2 96%   BMI 34.78 kg/m   Physical Exam Vitals and nursing note reviewed.  Constitutional:      General: She is not in acute distress.    Appearance: Normal  appearance. She is well-developed. She is not ill-appearing, toxic-appearing or diaphoretic.  HENT:     Head: Normocephalic and atraumatic.     Right Ear: External ear normal.     Left Ear: External ear normal.     Nose: Nose normal.     Mouth/Throat:     Mouth: Mucous membranes are moist.  Eyes:     Extraocular Movements: Extraocular movements intact.     Conjunctiva/sclera: Conjunctivae normal.  Cardiovascular:     Rate and Rhythm: Normal rate and regular rhythm.     Heart sounds: No murmur heard. Pulmonary:     Effort: Pulmonary effort is normal. No respiratory distress.     Breath sounds: Normal breath sounds. No wheezing or rales.  Chest:     Chest wall: No tenderness.  Abdominal:     General: There is no distension.     Palpations: Abdomen is soft.     Tenderness: There is abdominal tenderness.  Musculoskeletal:        General: Tenderness present. No swelling.     Cervical back: Normal range of motion and neck supple.  Skin:    General: Skin is warm and dry.     Coloration: Skin is not jaundiced or pale.  Neurological:     General: No focal deficit present.     Mental Status: She is alert and oriented to person, place, and time.     Cranial Nerves: No cranial nerve deficit.     Sensory: No sensory deficit.     Motor: Weakness (Global) present.     Coordination: Coordination normal.  Psychiatric:        Mood and Affect: Mood normal.        Behavior: Behavior normal.     (all labs ordered are listed, but only abnormal results are displayed) Labs Reviewed  URINALYSIS, ROUTINE W REFLEX MICROSCOPIC - Abnormal; Notable for the following components:      Result Value   APPearance CLOUDY (*)    Protein, ur 30 (*)    Leukocytes,Ua LARGE (*)    Bacteria, UA MANY (*)    All other components within normal limits  BASIC METABOLIC PANEL WITH GFR - Abnormal; Notable for the following components:   Glucose, Bld 57 (*)    BUN 79 (*)    Creatinine, Ser 3.01 (*)    GFR,  Estimated 15 (*)    All other components within normal limits  HEPATIC FUNCTION PANEL - Abnormal; Notable for the following components:   Albumin 3.4 (*)    AST 44 (*)    Bilirubin, Direct 0.3 (*)    All  other components within normal limits  MAGNESIUM  - Abnormal; Notable for the following components:   Magnesium  3.6 (*)    All other components within normal limits  CK - Abnormal; Notable for the following components:   Total CK 366 (*)    All other components within normal limits  CBC WITH DIFFERENTIAL/PLATELET - Abnormal; Notable for the following components:   WBC 11.1 (*)    Hemoglobin 10.4 (*)    HCT 34.8 (*)    MCH 24.2 (*)    MCHC 29.9 (*)    RDW 16.8 (*)    Neutro Abs 8.8 (*)    Monocytes Absolute 1.3 (*)    Abs Immature Granulocytes 0.13 (*)    All other components within normal limits  PRO BRAIN NATRIURETIC PEPTIDE - Abnormal; Notable for the following components:   Pro Brain Natriuretic Peptide 2,292.0 (*)    All other components within normal limits  CBG MONITORING, ED - Abnormal; Notable for the following components:   Glucose-Capillary 148 (*)    All other components within normal limits  TROPONIN T, HIGH SENSITIVITY - Abnormal; Notable for the following components:   Troponin T High Sensitivity 76 (*)    All other components within normal limits  TROPONIN T, HIGH SENSITIVITY - Abnormal; Notable for the following components:   Troponin T High Sensitivity 73 (*)    All other components within normal limits  URINE CULTURE  PROTIME-INR  CBC WITH DIFFERENTIAL/PLATELET  CBC  BASIC METABOLIC PANEL WITH GFR  CK  MAGNESIUM   PRO BRAIN NATRIURETIC PEPTIDE  I-STAT CG4 LACTIC ACID, ED    EKG: None  Radiology: CT CHEST ABDOMEN PELVIS WO CONTRAST Result Date: 06/09/2024 CLINICAL DATA:  Recent fall with increased lethargy, initial encounter EXAM: CT CHEST, ABDOMEN AND PELVIS WITHOUT CONTRAST TECHNIQUE: Multidetector CT imaging of the chest, abdomen and pelvis was  performed following the standard protocol without IV contrast. RADIATION DOSE REDUCTION: This exam was performed according to the departmental dose-optimization program which includes automated exposure control, adjustment of the mA and/or kV according to patient size and/or use of iterative reconstruction technique. COMPARISON:  None Available. FINDINGS: CT CHEST FINDINGS Cardiovascular: Atherosclerotic calcifications of the thoracic aorta are noted. Pacing device is seen. No cardiac enlargement is noted. Pulmonary artery appears within normal limits. Mediastinum/Nodes: Thoracic inlet is within normal limits. No hilar or mediastinal adenopathy is noted. The esophagus as visualized is within normal limits. Lungs/Pleura: Small right-sided pleural effusion is noted. Right basilar atelectasis is noted. No sizable parenchymal nodule is seen. Musculoskeletal: Degenerative changes of the thoracic spine are noted. No acute rib abnormality is seen. CT ABDOMEN PELVIS FINDINGS Hepatobiliary: Liver is within normal limits. Gallbladder is well distended with dependent density likely related to small gallstones. No biliary ductal dilatation is seen. Pancreas: Unremarkable. No pancreatic ductal dilatation or surrounding inflammatory changes. Spleen: Normal in size without focal abnormality. Adrenals/Urinary Tract: Adrenal glands are within normal limits. Kidneys are well visualized bilaterally with evidence of hydronephrosis and hydroureter. This extends to the level of the urinary bladder. No definitive stones are seen. This is likely related to over distended bladder. Stomach/Bowel: No obstructive or inflammatory changes of colon are noted. The appendix is not well visualized. No inflammatory changes to suggest appendicitis are noted. Small bowel and stomach are within normal limits. Vascular/Lymphatic: Aortic atherosclerosis. No enlarged abdominal or pelvic lymph nodes. Reproductive: Status post hysterectomy. No adnexal  masses. Other: No abdominal wall hernia or abnormality. No abdominopelvic ascites. Musculoskeletal: Degenerative changes of lumbar spine.  IMPRESSION: CT of the chest: Small right effusion and right basilar atelectasis. CT of the abdomen and pelvis: Dependent density within the gallbladder likely representing small gallstones or gallbladder sludge. Bilateral hydronephrosis and hydroureter secondary to over distended bladder. No other focal abnormality is noted. Electronically Signed   By: Oneil Devonshire M.D.   On: 06/09/2024 20:44   CT L-SPINE NO CHARGE Result Date: 06/09/2024 CLINICAL DATA:  Fall 3 days ago EXAM: CT Thoracic and Lumbar spine without contrast TECHNIQUE: Multiplanar CT images of the thoracic and lumbar spine were reconstructed from contemporary CT of the Chest, Abdomen, and Pelvis. RADIATION DOSE REDUCTION: This exam was performed according to the departmental dose-optimization program which includes automated exposure control, adjustment of the mA and/or kV according to patient size and/or use of iterative reconstruction technique. CONTRAST:  None COMPARISON:  None Available. FINDINGS: CT THORACIC SPINE FINDINGS Alignment: Within normal limits although increased kyphosis is noted in the upper thoracic spine. Vertebrae: 12 thoracic vertebra are noted. Mild osteophytic changes are seen. No acute fracture is seen. No paraspinal mass is noted. The visualize ribcage is within normal limits. Paraspinal and other soft tissues: Paraspinal soft tissues are within normal limits. Small right-sided pleural effusion is noted with basilar atelectasis. Disc levels: No acute disc pathology is noted. CT LUMBAR SPINE FINDINGS Segmentation: 5 lumbar type vertebral bodies are well visualized. Alignment: Alignment is within normal limits with the exception of mild degenerative anterolisthesis of L5 on S1. No pars defects are noted. Vertebrae: Vertebral body height is well maintained. Multilevel facet hypertrophic  changes and osteophytic changes are noted. No acute fracture or acute facet abnormality is noted. Paraspinal and other soft tissues: Paraspinal soft tissue structures show the bladder to be well distended. No focal mass lesion is noted. Disc levels: No specific disc pathology is noted. Central canal stenosis is noted at L4-5 secondary to facet hypertrophic changes as well as a generalized disc bulge. Similar findings are noted at L5-S1 accentuated by the degenerative anterolisthesis. IMPRESSION: CT of the thoracic spine: Degenerative change without acute abnormality. CT of the lumbar spine: Multilevel degenerative change without acute bony abnormality. Central canal stenosis is noted at L4-5 and L5-S1 as described. Electronically Signed   By: Oneil Devonshire M.D.   On: 06/09/2024 20:38   CT T-SPINE NO CHARGE Result Date: 06/09/2024 CLINICAL DATA:  Fall 3 days ago EXAM: CT Thoracic and Lumbar spine without contrast TECHNIQUE: Multiplanar CT images of the thoracic and lumbar spine were reconstructed from contemporary CT of the Chest, Abdomen, and Pelvis. RADIATION DOSE REDUCTION: This exam was performed according to the departmental dose-optimization program which includes automated exposure control, adjustment of the mA and/or kV according to patient size and/or use of iterative reconstruction technique. CONTRAST:  None COMPARISON:  None Available. FINDINGS: CT THORACIC SPINE FINDINGS Alignment: Within normal limits although increased kyphosis is noted in the upper thoracic spine. Vertebrae: 12 thoracic vertebra are noted. Mild osteophytic changes are seen. No acute fracture is seen. No paraspinal mass is noted. The visualize ribcage is within normal limits. Paraspinal and other soft tissues: Paraspinal soft tissues are within normal limits. Small right-sided pleural effusion is noted with basilar atelectasis. Disc levels: No acute disc pathology is noted. CT LUMBAR SPINE FINDINGS Segmentation: 5 lumbar type  vertebral bodies are well visualized. Alignment: Alignment is within normal limits with the exception of mild degenerative anterolisthesis of L5 on S1. No pars defects are noted. Vertebrae: Vertebral body height is well maintained. Multilevel facet hypertrophic changes and  osteophytic changes are noted. No acute fracture or acute facet abnormality is noted. Paraspinal and other soft tissues: Paraspinal soft tissue structures show the bladder to be well distended. No focal mass lesion is noted. Disc levels: No specific disc pathology is noted. Central canal stenosis is noted at L4-5 secondary to facet hypertrophic changes as well as a generalized disc bulge. Similar findings are noted at L5-S1 accentuated by the degenerative anterolisthesis. IMPRESSION: CT of the thoracic spine: Degenerative change without acute abnormality. CT of the lumbar spine: Multilevel degenerative change without acute bony abnormality. Central canal stenosis is noted at L4-5 and L5-S1 as described. Electronically Signed   By: Oneil Devonshire M.D.   On: 06/09/2024 20:38   CT Cervical Spine Wo Contrast Result Date: 06/09/2024 CLINICAL DATA:  Neck trauma (Age >= 65y).  Fall 3 days ago. EXAM: CT CERVICAL SPINE WITHOUT CONTRAST TECHNIQUE: Multidetector CT imaging of the cervical spine was performed without intravenous contrast. Multiplanar CT image reconstructions were also generated. RADIATION DOSE REDUCTION: This exam was performed according to the departmental dose-optimization program which includes automated exposure control, adjustment of the mA and/or kV according to patient size and/or use of iterative reconstruction technique. COMPARISON:  None Available. FINDINGS: Alignment: Normal Skull base and vertebrae: Diffuse osteopenia.  No fracture. Soft tissues and spinal canal: No prevertebral fluid or swelling. No visible canal hematoma. Disc levels: Moderate bilateral degenerative facet disease, right greater than left. Mild diffuse  degenerative disc disease. Upper chest: No acute findings. Other: None IMPRESSION: Multilevel degenerative disc and facet disease. No acute bony abnormality. Electronically Signed   By: Franky Crease M.D.   On: 06/09/2024 20:33   CT Head Wo Contrast Result Date: 06/09/2024 CLINICAL DATA:  Head trauma, minor (Age >= 65y).  Fall 3 days ago. EXAM: CT HEAD WITHOUT CONTRAST TECHNIQUE: Contiguous axial images were obtained from the base of the skull through the vertex without intravenous contrast. RADIATION DOSE REDUCTION: This exam was performed according to the departmental dose-optimization program which includes automated exposure control, adjustment of the mA and/or kV according to patient size and/or use of iterative reconstruction technique. COMPARISON:  02/19/2024 FINDINGS: Brain: Diffuse age related atrophy. No acute intracranial abnormality. Specifically, no hemorrhage, hydrocephalus, mass lesion, acute infarction, or significant intracranial injury. Vascular: No hyperdense vessel or unexpected calcification. Skull: No acute calvarial abnormality. Sinuses/Orbits: No acute findings Other: None IMPRESSION: No acute intracranial abnormality. Electronically Signed   By: Franky Crease M.D.   On: 06/09/2024 20:31   DG Chest Port 1 View Result Date: 06/09/2024 CLINICAL DATA:  Status post fall. EXAM: PORTABLE CHEST 1 VIEW COMPARISON:  July 11, 2023 FINDINGS: There is stable dual lead AICD positioning. The heart size and mediastinal contours are within normal limits. Low lung volumes are noted. No acute infiltrate, pleural effusion or pneumothorax is identified. Multilevel degenerative changes are seen throughout the thoracic spine. IMPRESSION: No active disease. Electronically Signed   By: Suzen Dials M.D.   On: 06/09/2024 16:57   DG Pelvis Portable Result Date: 06/09/2024 CLINICAL DATA:  Status post fall. EXAM: PORTABLE PELVIS 1-2 VIEWS COMPARISON:  None Available. FINDINGS: There is no evidence of  pelvic fracture or diastasis. No pelvic bone lesions are seen. Moderate severity degenerative changes are seen involving both hips in the form of joint space narrowing and acetabular sclerosis. Additional marked severity degenerative changes are noted within the lower lumbar spine. IMPRESSION: 1. No acute fracture or dislocation. 2. Moderate severity degenerative changes involving both hips. Electronically Signed  By: Suzen Dials M.D.   On: 06/09/2024 16:56     Procedures   Medications Ordered in the ED  allopurinol  (ZYLOPRIM ) tablet 100 mg (has no administration in time range)  amLODipine  (NORVASC ) tablet 5 mg (has no administration in time range)  atorvastatin  (LIPITOR) tablet 40 mg (has no administration in time range)  HYDROcodone -acetaminophen  (NORCO/VICODIN) 5-325 MG per tablet 1 tablet (has no administration in time range)  metoprolol  succinate (TOPROL -XL) 24 hr tablet 25 mg (has no administration in time range)  pantoprazole  (PROTONIX ) EC tablet 40 mg (has no administration in time range)  heparin  injection 5,000 Units (has no administration in time range)  cefTRIAXone  (ROCEPHIN ) 1 g in sodium chloride  0.9 % 100 mL IVPB (has no administration in time range)  oxyCODONE -acetaminophen  (PERCOCET/ROXICET) 5-325 MG per tablet 1 tablet (1 tablet Oral Given 06/09/24 1637)  lactated ringers  bolus 1,000 mL (0 mLs Intravenous Stopped 06/09/24 1958)  dextrose  (D10W) 10% bolus 250 mL (0 mLs Intravenous Stopped 06/09/24 1959)  ceFEPIme  (MAXIPIME ) 2 g in sodium chloride  0.9 % 100 mL IVPB (0 g Intravenous Stopped 06/09/24 2255)                                    Medical Decision Making Amount and/or Complexity of Data Reviewed Labs: ordered. Radiology: ordered.  Risk Prescription drug management. Decision regarding hospitalization.   This patient presents to the ED for concern of generalized weakness, this involves an extensive number of treatment options, and is a complaint that  carries with it a high risk of complications and morbidity.  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  The pertinent results include: Baseline anemia, mild leukocytosis, AKI, and UTI are present   Imaging Studies ordered:  I ordered imaging studies including x-ray of chest and pelvis, CT of head, cervical spine, chest, abdomen, pelvis, T-spine, L-spine I independently visualized and interpreted imaging which showed no acute injuries, distended bladder with bilateral hydroureteronephrosis I agree with the radiologist interpretation   Cardiac Monitoring: / EKG:  The patient was maintained on a cardiac monitor.  I personally viewed and interpreted the cardiac monitored which showed an underlying rhythm of: Sinus rhythm   Problem List / ED Course / Critical interventions / Medication management  Patient presenting for generalized weakness and diffuse pain for the past 3 days.  This does follow a mechanical fall at home that occurred 3 days ago.  On arrival in the ED, vital signs notable for mild hypertension.  Patient is alert and oriented on exam.  She does describe an area of specific pain in region of L1.  Tenderness is present in this area.  She also has diffuse tenderness throughout the muscles of legs.  She endorses some mild abdominal tenderness.  Patient declines any pain medication at this time.  Workup was initiated.  Initial lab work is notable for AKI.  On reassessment, patient resting comfortably.  Daughter is  at bedside.  Daughter states that she was treated for UTI a month ago.  She stated that  the best way to recheck urine would be an In-N-Out catheter.  This was ordered.  After In-N-Out catheter was performed, CT imaging showed bladder distention with bilateral hydroureteronephrosis consistent with postobstructive nephropathy.  Urine from In-N-Out catheter showed frank pus.  Antibiotics were ordered and patient and daughter were consented for indwelling Foley catheter for source control.  Urine retention would explain her AKI.  Patient was admitted for further management. I ordered medication including IV fluids for hydration, Percocet for analgesia, cefepime  for UTI Reevaluation of the patient after these medicines showed that the patient improved I have reviewed the patients home medicines and have made adjustments as needed   Social Determinants of Health:  Lives at home with family     Final diagnoses:  Urinary tract infection without hematuria, site unspecified  Urine retention  AKI (acute kidney injury)    ED Discharge Orders     None          Melvenia Motto, MD 06/09/24 2331

## 2024-06-09 NOTE — H&P (Signed)
 History and Physical    Krista Clarke FMW:[id number] DOB: 1941-08-07 DOA: 06/09/2024  PCP: Jodie Lavern LITTIE, MD  Patient coming from: Home  Chief Complaint: Generalized weakness  HPI: Krista Clarke is a 83 y.o. female with medical history significant of Alzheimer's dementia, type 2 diabetes with peripheral neuropathy, chronic low back pain with bilateral sciatica, hypertension, hyperlipidemia, CKD stage IIIa, CHB status post PPM, anxiety/mood disorder, GERD, gout, chronic anemia presents to the ED today for evaluation of generalized weakness, recent fall 3 days ago, and poor p.o. intake.  Vital signs on arrival: Temperature 98.5 F, pulse 71, respiratory rate 18, blood pressure 146/59, and SpO2 98% on room air.  Labs notable for WBC count 11.1, hemoglobin 10.4 (stable), glucose 57, BUN 79, creatinine 3.0 (baseline 1.1), no significant elevation of LFTs, magnesium  3.6, CK 366, troponin 76> 73, lactic acid normal, proBNP 2292. Urine cloudy in appearance with large amount of leukocytes and microscopy showing >50 WBCs and many bacteria. X-ray of pelvis negative for acute fracture or dislocation.  CT head/C-spine/T and L-spine negative for acute traumatic injuries.  CT chest showing small right effusion and right basilar atelectasis.  CT abdomen pelvis showing bilateral hydronephrosis and hydroureter secondary to over distended bladder.  EKG without acute ischemic changes. Patient was given Percocet, cefepime , 250 mL D10 bolus, and 1 L LR in the ED.  Foley catheter placed.  TRH called to admit.  History provided by the patient and her daughter at bedside.  Patient is endorsing generalized weakness for the past few days.  She had a fall 3 days ago.  Daughter states patient was sitting at the side of her bed and was too weak to get up so she slid down from the bed to the floor on her buttocks.  Patient had lost control of her bladder after the fall and had soiled her clothing.  Family then took her to the bathroom  to take a shower and as she was getting out of the shower, she felt weak again and family had to lower her to the ground.  No head injury or loss of consciousness reported.  Daughter states patient has been complaining of generalized pain all over her body.  Patient is endorsing dysuria and urinary frequency/urgency.  Denies fevers, cough, shortness of breath, chest pain, nausea, vomiting, abdominal pain, or diarrhea.  Review of Systems:  Review of Systems  All other systems reviewed and are negative.   Past Medical History:  Diagnosis Date   Anemia    takes iron daily   Arthritis    Complete heart block (HCC) 08/20/2015   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     Diabetes mellitus without complication (HCC)    Encounter for care of pacemaker 05/24/2019   Hypercholesteremia    Hypertension    Pacemaker; 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 Addie 2010 lumbar MRI, multilevel and severe.    Past Surgical History:  Procedure Laterality Date   ABDOMINAL HYSTERECTOMY     COLONOSCOPY WITH PROPOFOL  N/A 07/03/2013   Procedure: COLONOSCOPY WITH PROPOFOL ;  Surgeon: Gladis MARLA Louder, MD;  Location: [facility];  Service: Endoscopy;  Laterality: N/A;   EP IMPLANTABLE DEVICE N/A 08/20/2015   Procedure: Pacemaker Implant;  Surgeon: Danelle LELON Birmingham, MD;  Location: [facility] INVASIVE CV LAB;  Service: Cardiovascular;  Laterality: N/A;  JOINT REPLACEMENT Bilateral    '99. Knees   LEFT HEART CATHETERIZATION WITH CORONARY ANGIOGRAM N/A 08/27/2014   Procedure: LEFT HEART CATHETERIZATION WITH CORONARY ANGIOGRAM;  Surgeon: Erick JONELLE Bergamo, MD;  Location: [facility] CATH LAB;  Service: Cardiovascular;  Laterality: N/A;   TUBAL LIGATION       reports that she has never smoked. She has never used smokeless tobacco. She reports that she does not drink alcohol  and does not use  drugs.  Allergies  Allergen Reactions   Prednisone Other (See Comments), Palpitations and Rash    Doesn't work well for patient    Family History  Problem Relation Age of Onset   Hypertension Mother    Diabetes Mother    Hypertension Father    Cancer Brother    Breast cancer Granddaughter        30s    Prior to Admission medications   Medication Sig Start Date End Date Taking? Authorizing Provider  Accu-Chek Softclix Lancets lancets Use as instructed 03/08/24   [name], MD  allopurinol  (ZYLOPRIM ) 100 MG tablet TAKE 1 TABLET BY MOUTH EVERY MORNING 09/15/23   [name], MD  amLODipine  (NORVASC ) 5 MG tablet TAKE 1 TABLET BY MOUTH EVERY DAY 12/08/23   [name], MD  ascorbic acid  (VITAMIN C ) 500 MG tablet TAKE 1 TABLET BY MOUTH EVERY DAY 11/08/23   [name], MD  ASPIRIN  LOW DOSE 81 MG chewable tablet CHEW ONE TABLET IN MOUTH EVERY DAY 02/03/24   [name], MD  atorvastatin  (LIPITOR) 40 MG tablet TAKE 1 TABLET BY MOUTH AT BEDTIME 05/14/24   [name], MD  Blood Glucose Monitoring Suppl (ACCU-CHEK GUIDE ME) w/Device KIT USE AS DIRECTED 03/08/24   [name], MD  cyanocobalamin  (VITAMIN B12) 1000 MCG tablet TAKE 1 TABLET BY MOUTH EVERY DAY 12/08/23   [name], MD  dapagliflozin  propanediol (FARXIGA ) 10 MG TABS tablet Take 1 tablet (10 mg total) by mouth daily. 02/07/23   [name], MD  diclofenac  Sodium (VOLTAREN ) 1 % GEL Apply 4 GRAMS topically 4 TIMES daily AS NEEDED FOR PAIN 05/24/23   [name], MD  FEROSUL 325 (65 Fe) MG tablet TAKE 1 TABLET BY MOUTH 2 TIMES DAILY 02/03/24   [name], MD  furosemide  (LASIX ) 40 MG tablet Take 1 tablet (40 mg total) by mouth daily. 03/27/24   [name], MD  glucose blood (ACCU-CHEK GUIDE TEST) test strip Use as instructed 03/08/24   [name], MD  HYDROcodone -acetaminophen  (NORCO/VICODIN) 5-325 MG tablet TAKE 1 TABLET BY MOUTH 2 TIMES DAILY 06/04/24   [name], MD  losartan   (COZAAR ) 100 MG tablet TAKE 1 TABLET BY MOUTH EVERY DAY 09/15/23   [name], MD  Lutein  20 MG CAPS TAKE 1 CAPSULE BY MOUTH EVERY DAY 11/08/23   [name], MD  magnesium  oxide (MAG-OX) 400 (240 Mg) MG tablet TAKE 1 TABLET BY MOUTH ONCE DAILY 03/29/24   [name], MD  meclizine  (ANTIVERT ) 12.5 MG tablet Take 1 tablet (12.5 mg total) by mouth 2 (two) times daily as needed for dizziness. 08/26/20   [name], MD  metoprolol  succinate (TOPROL -XL) 25 MG 24 hr tablet TAKE 1 TABLET BY MOUTH EVERY DAY 12/08/23   [name], MD  omeprazole  (PRILOSEC) 20 MG capsule TAKE 1 CAPSULE BY MOUTH EVERY DAY BEFORE BREAKFAST 10/12/23   [name], MD  triamcinolone  (KENALOG ) 0.1 % Apply 1  application topically 2 (two) times daily. For 2 weeks, then as needed 10/17/20   [name], MD    Physical Exam: Vitals:   06/09/24 1518 06/09/24 1630 06/09/24 1640 06/09/24 1930  BP: (!) 146/59 139/67 139/67 (!) 142/73  Pulse: 71 80 69 75  Resp: 18 18 17 14   Temp: 98.5 F (36.9 C)  98.2 F (36.8 C) 98.6 F (37 C)  TempSrc:   Oral   SpO2: 98% 97% 91% 91%    Physical Exam Vitals reviewed.  Constitutional:      General: She is not in acute distress. HENT:     Head: Normocephalic and atraumatic.     Mouth/Throat:     Mouth: Mucous membranes are dry.  Eyes:     Extraocular Movements: Extraocular movements intact.  Cardiovascular:     Rate and Rhythm: Normal rate and regular rhythm.     Pulses: Normal pulses.  Pulmonary:     Effort: Pulmonary effort is normal. No respiratory distress.     Breath sounds: No wheezing or rales.  Abdominal:     General: Bowel sounds are normal.     Palpations: Abdomen is soft.     Tenderness: There is no abdominal tenderness. There is no guarding.  Musculoskeletal:     Cervical back: Normal range of motion.     Right lower leg: No edema.     Left lower leg: No edema.  Skin:    General: Skin is warm and dry.  Neurological:     General: No focal  deficit present.     Mental Status: She is alert and oriented to person, place, and time.     Labs on Admission: I have personally reviewed following labs and imaging studies  CBC: Recent Labs  Lab 06/09/24 1825  WBC 11.1*  NEUTROABS 8.8*  HGB 10.4*  HCT 34.8*  MCV 80.9  PLT 239   Basic Metabolic Panel: Recent Labs  Lab 06/09/24 1533  NA 137  K 4.7  CL 99  CO2 26  GLUCOSE 57*  BUN 79*  CREATININE 3.01*  CALCIUM  9.2  MG 3.6*   GFR: CrCl cannot be calculated (Unknown ideal weight.). Liver Function Tests: Recent Labs  Lab 06/09/24 1533  AST 44*  ALT 20  ALKPHOS 94  BILITOT 0.6  PROT 7.1  ALBUMIN 3.4*   No results for input(s): LIPASE, AMYLASE in the last 168 hours. No results for input(s): AMMONIA in the last 168 hours. Coagulation Profile: Recent Labs  Lab 06/09/24 1533  INR 1.0   Cardiac Enzymes: Recent Labs  Lab 06/09/24 1533  CKTOTAL 366*   BNP (last 3 results) Recent Labs    03/08/24 1117 06/09/24 1825  PROBNP 76.0 2,292.0*   HbA1C: No results for input(s): HGBA1C in the last 72 hours. CBG: No results for input(s): GLUCAP in the last 168 hours. Lipid Profile: No results for input(s): CHOL, HDL, LDLCALC, TRIG, CHOLHDL, LDLDIRECT in the last 72 hours. Thyroid  Function Tests: No results for input(s): TSH, T4TOTAL, FREET4, T3FREE, THYROIDAB in the last 72 hours. Anemia Panel: No results for input(s): VITAMINB12, FOLATE, FERRITIN, TIBC, IRON, RETICCTPCT in the last 72 hours. Urine analysis:    Component Value Date/Time   COLORURINE YELLOW 06/09/2024 1534   APPEARANCEUR CLOUDY (A) 06/09/2024 1534   LABSPEC 1.009 06/09/2024 1534   PHURINE 5.0 06/09/2024 1534   GLUCOSEU NEGATIVE 06/09/2024 1534   GLUCOSEU NEGATIVE 10/17/2020 1202   HGBUR NEGATIVE 06/09/2024 1534   BILIRUBINUR NEGATIVE 06/09/2024 1534  BILIRUBINUR 1+ 10/17/2020 1105   KETONESUR NEGATIVE 06/09/2024 1534   PROTEINUR 30 (A)  06/09/2024 1534   UROBILINOGEN 0.2 02/26/2022 1800   NITRITE NEGATIVE 06/09/2024 1534   LEUKOCYTESUR LARGE (A) 06/09/2024 1534    Radiological Exams on Admission: CT CHEST ABDOMEN PELVIS WO CONTRAST Result Date: 06/09/2024 CLINICAL DATA:  Recent fall with increased lethargy, initial encounter EXAM: CT CHEST, ABDOMEN AND PELVIS WITHOUT CONTRAST TECHNIQUE: Multidetector CT imaging of the chest, abdomen and pelvis was performed following the standard protocol without IV contrast. RADIATION DOSE REDUCTION: This exam was performed according to the departmental dose-optimization program which includes automated exposure control, adjustment of the mA and/or kV according to patient size and/or use of iterative reconstruction technique. COMPARISON:  None Available. FINDINGS: CT CHEST FINDINGS Cardiovascular: Atherosclerotic calcifications of the thoracic aorta are noted. Pacing device is seen. No cardiac enlargement is noted. Pulmonary artery appears within normal limits. Mediastinum/Nodes: Thoracic inlet is within normal limits. No hilar or mediastinal adenopathy is noted. The esophagus as visualized is within normal limits. Lungs/Pleura: Small right-sided pleural effusion is noted. Right basilar atelectasis is noted. No sizable parenchymal nodule is seen. Musculoskeletal: Degenerative changes of the thoracic spine are noted. No acute rib abnormality is seen. CT ABDOMEN PELVIS FINDINGS Hepatobiliary: Liver is within normal limits. Gallbladder is well distended with dependent density likely related to small gallstones. No biliary ductal dilatation is seen. Pancreas: Unremarkable. No pancreatic ductal dilatation or surrounding inflammatory changes. Spleen: Normal in size without focal abnormality. Adrenals/Urinary Tract: Adrenal glands are within normal limits. Kidneys are well visualized bilaterally with evidence of hydronephrosis and hydroureter. This extends to the level of the urinary bladder. No definitive  stones are seen. This is likely related to over distended bladder. Stomach/Bowel: No obstructive or inflammatory changes of colon are noted. The appendix is not well visualized. No inflammatory changes to suggest appendicitis are noted. Small bowel and stomach are within normal limits. Vascular/Lymphatic: Aortic atherosclerosis. No enlarged abdominal or pelvic lymph nodes. Reproductive: Status post hysterectomy. No adnexal masses. Other: No abdominal wall hernia or abnormality. No abdominopelvic ascites. Musculoskeletal: Degenerative changes of lumbar spine. IMPRESSION: CT of the chest: Small right effusion and right basilar atelectasis. CT of the abdomen and pelvis: Dependent density within the gallbladder likely representing small gallstones or gallbladder sludge. Bilateral hydronephrosis and hydroureter secondary to over distended bladder. No other focal abnormality is noted. Electronically Signed   By: Oneil Devonshire M.D.   On: 06/09/2024 20:44   CT L-SPINE NO CHARGE Result Date: 06/09/2024 CLINICAL DATA:  Fall 3 days ago EXAM: CT Thoracic and Lumbar spine without contrast TECHNIQUE: Multiplanar CT images of the thoracic and lumbar spine were reconstructed from contemporary CT of the Chest, Abdomen, and Pelvis. RADIATION DOSE REDUCTION: This exam was performed according to the departmental dose-optimization program which includes automated exposure control, adjustment of the mA and/or kV according to patient size and/or use of iterative reconstruction technique. CONTRAST:  None COMPARISON:  None Available. FINDINGS: CT THORACIC SPINE FINDINGS Alignment: Within normal limits although increased kyphosis is noted in the upper thoracic spine. Vertebrae: 12 thoracic vertebra are noted. Mild osteophytic changes are seen. No acute fracture is seen. No paraspinal mass is noted. The visualize ribcage is within normal limits. Paraspinal and other soft tissues: Paraspinal soft tissues are within normal limits. Small  right-sided pleural effusion is noted with basilar atelectasis. Disc levels: No acute disc pathology is noted. CT LUMBAR SPINE FINDINGS Segmentation: 5 lumbar type vertebral bodies are well visualized. Alignment: Alignment  is within normal limits with the exception of mild degenerative anterolisthesis of L5 on S1. No pars defects are noted. Vertebrae: Vertebral body height is well maintained. Multilevel facet hypertrophic changes and osteophytic changes are noted. No acute fracture or acute facet abnormality is noted. Paraspinal and other soft tissues: Paraspinal soft tissue structures show the bladder to be well distended. No focal mass lesion is noted. Disc levels: No specific disc pathology is noted. Central canal stenosis is noted at L4-5 secondary to facet hypertrophic changes as well as a generalized disc bulge. Similar findings are noted at L5-S1 accentuated by the degenerative anterolisthesis. IMPRESSION: CT of the thoracic spine: Degenerative change without acute abnormality. CT of the lumbar spine: Multilevel degenerative change without acute bony abnormality. Central canal stenosis is noted at L4-5 and L5-S1 as described. Electronically Signed   By: Oneil Devonshire M.D.   On: 06/09/2024 20:38   CT T-SPINE NO CHARGE Result Date: 06/09/2024 CLINICAL DATA:  Fall 3 days ago EXAM: CT Thoracic and Lumbar spine without contrast TECHNIQUE: Multiplanar CT images of the thoracic and lumbar spine were reconstructed from contemporary CT of the Chest, Abdomen, and Pelvis. RADIATION DOSE REDUCTION: This exam was performed according to the departmental dose-optimization program which includes automated exposure control, adjustment of the mA and/or kV according to patient size and/or use of iterative reconstruction technique. CONTRAST:  None COMPARISON:  None Available. FINDINGS: CT THORACIC SPINE FINDINGS Alignment: Within normal limits although increased kyphosis is noted in the upper thoracic spine. Vertebrae: 12  thoracic vertebra are noted. Mild osteophytic changes are seen. No acute fracture is seen. No paraspinal mass is noted. The visualize ribcage is within normal limits. Paraspinal and other soft tissues: Paraspinal soft tissues are within normal limits. Small right-sided pleural effusion is noted with basilar atelectasis. Disc levels: No acute disc pathology is noted. CT LUMBAR SPINE FINDINGS Segmentation: 5 lumbar type vertebral bodies are well visualized. Alignment: Alignment is within normal limits with the exception of mild degenerative anterolisthesis of L5 on S1. No pars defects are noted. Vertebrae: Vertebral body height is well maintained. Multilevel facet hypertrophic changes and osteophytic changes are noted. No acute fracture or acute facet abnormality is noted. Paraspinal and other soft tissues: Paraspinal soft tissue structures show the bladder to be well distended. No focal mass lesion is noted. Disc levels: No specific disc pathology is noted. Central canal stenosis is noted at L4-5 secondary to facet hypertrophic changes as well as a generalized disc bulge. Similar findings are noted at L5-S1 accentuated by the degenerative anterolisthesis. IMPRESSION: CT of the thoracic spine: Degenerative change without acute abnormality. CT of the lumbar spine: Multilevel degenerative change without acute bony abnormality. Central canal stenosis is noted at L4-5 and L5-S1 as described. Electronically Signed   By: Oneil Devonshire M.D.   On: 06/09/2024 20:38   CT Cervical Spine Wo Contrast Result Date: 06/09/2024 CLINICAL DATA:  Neck trauma (Age >= 65y).  Fall 3 days ago. EXAM: CT CERVICAL SPINE WITHOUT CONTRAST TECHNIQUE: Multidetector CT imaging of the cervical spine was performed without intravenous contrast. Multiplanar CT image reconstructions were also generated. RADIATION DOSE REDUCTION: This exam was performed according to the departmental dose-optimization program which includes automated exposure control,  adjustment of the mA and/or kV according to patient size and/or use of iterative reconstruction technique. COMPARISON:  None Available. FINDINGS: Alignment: Normal Skull base and vertebrae: Diffuse osteopenia.  No fracture. Soft tissues and spinal canal: No prevertebral fluid or swelling. No visible canal hematoma. Disc levels:  Moderate bilateral degenerative facet disease, right greater than left. Mild diffuse degenerative disc disease. Upper chest: No acute findings. Other: None IMPRESSION: Multilevel degenerative disc and facet disease. No acute bony abnormality. Electronically Signed   By: Franky Crease M.D.   On: 06/09/2024 20:33   CT Head Wo Contrast Result Date: 06/09/2024 CLINICAL DATA:  Head trauma, minor (Age >= 65y).  Fall 3 days ago. EXAM: CT HEAD WITHOUT CONTRAST TECHNIQUE: Contiguous axial images were obtained from the base of the skull through the vertex without intravenous contrast. RADIATION DOSE REDUCTION: This exam was performed according to the departmental dose-optimization program which includes automated exposure control, adjustment of the mA and/or kV according to patient size and/or use of iterative reconstruction technique. COMPARISON:  02/19/2024 FINDINGS: Brain: Diffuse age related atrophy. No acute intracranial abnormality. Specifically, no hemorrhage, hydrocephalus, mass lesion, acute infarction, or significant intracranial injury. Vascular: No hyperdense vessel or unexpected calcification. Skull: No acute calvarial abnormality. Sinuses/Orbits: No acute findings Other: None IMPRESSION: No acute intracranial abnormality. Electronically Signed   By: Franky Crease M.D.   On: 06/09/2024 20:31   DG Chest Port 1 View Result Date: 06/09/2024 CLINICAL DATA:  Status post fall. EXAM: PORTABLE CHEST 1 VIEW COMPARISON:  July 11, 2023 FINDINGS: There is stable dual lead AICD positioning. The heart size and mediastinal contours are within normal limits. Low lung volumes are noted. No acute  infiltrate, pleural effusion or pneumothorax is identified. Multilevel degenerative changes are seen throughout the thoracic spine. IMPRESSION: No active disease. Electronically Signed   By: Suzen Dials M.D.   On: 06/09/2024 16:57   DG Pelvis Portable Result Date: 06/09/2024 CLINICAL DATA:  Status post fall. EXAM: PORTABLE PELVIS 1-2 VIEWS COMPARISON:  None Available. FINDINGS: There is no evidence of pelvic fracture or diastasis. No pelvic bone lesions are seen. Moderate severity degenerative changes are seen involving both hips in the form of joint space narrowing and acetabular sclerosis. Additional marked severity degenerative changes are noted within the lower lumbar spine. IMPRESSION: 1. No acute fracture or dislocation. 2. Moderate severity degenerative changes involving both hips. Electronically Signed   By: Suzen Dials M.D.   On: 06/09/2024 16:56    EKG: Independently reviewed.  Sinus rhythm, LAFB, LBBB.  No significant change since previous tracing.  Assessment and Plan  UTI Patient is endorsing urinary symptoms.  UA with evidence of pyuria and bacteriuria.  Mild leukocytosis on labs.  No fever, tachycardia, hypotension, or lactic acidosis to suggest sepsis.  Continue antibiotic coverage with ceftriaxone .  Follow-up urine culture.  AKI on CKD stage IIIa Could be prerenal in etiology from dehydration given poor p.o. intake versus postrenal/obstructive given acute urinary retention/bilateral hydronephrosis and hydroureter versus?cardiorenal given elevated proBNP.  Patient was given 1.25 L IV fluids in the ED.  Repeat labs in the morning to check renal function.  Avoid nephrotoxic agents/hold home Lasix  and losartan  at this time.  Continue Foley catheter and monitor urine output.  Acute on chronic HFpEF Last echo done in 2016 was showing grade 2 diastolic dysfunction.  proBNP 2292.  CT chest showing small right pleural effusion but no evidence of pulmonary edema.  Will hold off  giving diuretic at this time given concern for poor p.o. intake/dehydration/AKI.  Repeat echocardiogram ordered.  Repeat BNP in the morning.  Hypoglycemia Type 2 diabetes Hypoglycemia likely due to poor p.o. intake.  Patient is not on insulin .  Glucose now improved to 140s after D10 bolus in the ED.  Hold home Farxiga .  Continue CBG checks every 4 hours for now.  Acute urinary retention Bilateral hydronephrosis and hydroureter Continue Foley catheter.  Hypermagnesemia In the setting of magnesium  supplement use and AKI on CKD.  No hypotension, bradycardia, or respiratory depression.  Hold home magnesium  supplement and continue to monitor labs.  Mild elevation of CK In the setting of recent fall.  Patient was given IV fluids in the ED.  Trend CK.  Elevated troponin Troponin elevated but flat, not consistent with ACS.  EKG without acute ischemic changes and patient is not endorsing chest pain.  Suspect troponin elevation is due to demand ischemia.  Echocardiogram ordered as above.  Generalized weakness/recent fall PT/OT eval, fall precautions.  Chronic anemia Hemoglobin stable.  Alzheimer's dementia Delirium precautions.  Chronic pain syndrome Continue home hydrocodone -acetaminophen .  Hypertension Most recent SBP in the 140s.  Holding Lasix  and losartan  at this time given concern for dehydration and AKI.  Continue amlodipine  and metoprolol .  Hyperlipidemia Continue atorvastatin .  GERD Continue PPI.  Gout Continue allopurinol .  DVT prophylaxis: SQ Heparin  Code Status: DNR pre-arrest interventions desired (discussed with the patient and her daughter) Family Communication: Daughter at bedside. Level of care: Telemetry bed Admission status: It is my clinical opinion that admission to INPATIENT is reasonable and necessary because of the expectation that this patient will require hospital care that crosses at least 2 midnights to treat this condition based on the medical  complexity of the problems presented.  Given the aforementioned information, the predictability of an adverse outcome is felt to be significant.  Editha Ram MD [facility]  If 7PM-7AM, please contact night-coverage [email or web address]  06/09/2024, 9:51 PM

## 2024-06-10 ENCOUNTER — Encounter (HOSPITAL_COMMUNITY): Payer: Self-pay | Admitting: Internal Medicine

## 2024-06-10 ENCOUNTER — Inpatient Hospital Stay (HOSPITAL_COMMUNITY)

## 2024-06-10 DIAGNOSIS — N179 Acute kidney failure, unspecified: Secondary | ICD-10-CM

## 2024-06-10 DIAGNOSIS — N390 Urinary tract infection, site not specified: Secondary | ICD-10-CM | POA: Diagnosis not present

## 2024-06-10 DIAGNOSIS — F0280 Dementia in other diseases classified elsewhere without behavioral disturbance: Secondary | ICD-10-CM

## 2024-06-10 DIAGNOSIS — I5031 Acute diastolic (congestive) heart failure: Secondary | ICD-10-CM

## 2024-06-10 DIAGNOSIS — E119 Type 2 diabetes mellitus without complications: Secondary | ICD-10-CM | POA: Diagnosis not present

## 2024-06-10 DIAGNOSIS — I442 Atrioventricular block, complete: Secondary | ICD-10-CM | POA: Diagnosis not present

## 2024-06-10 DIAGNOSIS — G309 Alzheimer's disease, unspecified: Secondary | ICD-10-CM

## 2024-06-10 LAB — CK: Total CK: 234 U/L (ref 38–234)

## 2024-06-10 LAB — CBC
HCT: 28.2 % — ABNORMAL LOW (ref 36.0–46.0)
Hemoglobin: 8.7 g/dL — ABNORMAL LOW (ref 12.0–15.0)
MCH: 25.1 pg — ABNORMAL LOW (ref 26.0–34.0)
MCHC: 30.9 g/dL (ref 30.0–36.0)
MCV: 81.3 fL (ref 80.0–100.0)
Platelets: 268 K/uL (ref 150–400)
RBC: 3.47 MIL/uL — ABNORMAL LOW (ref 3.87–5.11)
RDW: 16.7 % — ABNORMAL HIGH (ref 11.5–15.5)
WBC: 11 K/uL — ABNORMAL HIGH (ref 4.0–10.5)
nRBC: 0 % (ref 0.0–0.2)

## 2024-06-10 LAB — GLUCOSE, CAPILLARY
Glucose-Capillary: 101 mg/dL — ABNORMAL HIGH (ref 70–99)
Glucose-Capillary: 106 mg/dL — ABNORMAL HIGH (ref 70–99)
Glucose-Capillary: 230 mg/dL — ABNORMAL HIGH (ref 70–99)
Glucose-Capillary: 210 mg/dL — ABNORMAL HIGH (ref 70–99)
Glucose-Capillary: 207 mg/dL — ABNORMAL HIGH (ref 70–99)

## 2024-06-10 LAB — ECHOCARDIOGRAM COMPLETE
AR max vel: 2.38 cm2
AV Area VTI: 2.39 cm2
AV Area mean vel: 2.51 cm2
AV Mean grad: 5.2 mmHg
AV Peak grad: 8.8 mmHg
Ao pk vel: 1.49 m/s
Area-P 1/2: 3.83 cm2
Calc EF: 61.6 %
Height: 61 in
S' Lateral: 2.6 cm
Single Plane A2C EF: 59.2 %
Single Plane A4C EF: 58.9 %
Weight: 2945.35 [oz_av]

## 2024-06-10 LAB — PRO BRAIN NATRIURETIC PEPTIDE: Pro Brain Natriuretic Peptide: 1632 pg/mL — ABNORMAL HIGH (ref <300.0)

## 2024-06-10 LAB — BASIC METABOLIC PANEL WITH GFR
Anion gap: 10 (ref 5–15)
BUN: 75 mg/dL — ABNORMAL HIGH (ref 8–23)
CO2: 25 mmol/L (ref 22–32)
Calcium: 8.5 mg/dL — ABNORMAL LOW (ref 8.9–10.3)
Chloride: 102 mmol/L (ref 98–111)
Creatinine, Ser: 2.5 mg/dL — ABNORMAL HIGH (ref 0.44–1.00)
GFR, Estimated: 18 mL/min — ABNORMAL LOW (ref >60)
Glucose, Bld: 99 mg/dL (ref 70–99)
Potassium: 4.3 mmol/L (ref 3.5–5.1)
Sodium: 136 mmol/L (ref 135–145)

## 2024-06-10 LAB — MAGNESIUM: Magnesium: 3.3 mg/dL — ABNORMAL HIGH (ref 1.7–2.4)

## 2024-06-10 MED ORDER — PERFLUTREN LIPID MICROSPHERE
1.0000 mL | INTRAVENOUS | Status: AC | PRN
  Administered 2024-06-10: 2 mL via INTRAVENOUS

## 2024-06-10 MED ORDER — LACTATED RINGERS IV SOLN: INTRAVENOUS | Status: AC

## 2024-06-10 MED ORDER — CHLORHEXIDINE GLUCONATE CLOTH 2 % EX PADS
6.0000 | MEDICATED_PAD | Freq: Every day | CUTANEOUS | Status: DC
  Administered 2024-06-10 – 2024-06-14 (×5): 6 via TOPICAL

## 2024-06-10 NOTE — Progress Notes (Signed)
 Progress Note   Patient: Krista Clarke FMW:[id number] DOB: [birth year] DOA: 06/09/2024     1 DOS: the patient was seen and examined on 06/10/2024   Brief hospital course: 83 year old woman PMH including Alzheimer's dementia, diabetes, pacemaker placement, presenting with generalized weakness, recent assisted slide/fall without apparent injury, who was admitted for UTI, AKI, acute urinary retention, bilateral hydronephrosis and hydroureter.  Foley catheter placed in the emergency department for acute urinary retention.  Consultants None  Procedures/Events 10/11 admission  Assessment and Plan: AKI superimposed on CKD stage III AA Acute urinary retention Bilateral hydronephrosis, hydroureter UTI Urinary retention, obstructive uropathy noted on CT, Foley catheter placed with return of urine and pus Renal function somewhat improved today, continue fluids and repeat BMP in a.m. Continue empiric antibiotics, hold losartan , furosemide .  Stop Farxiga  given this is her second UTI. Pharmacy review of medications for potential culprits causing urinary retention  Acute CHF ruled out Chronic diastolic CHF PMH complete heart block, status post permanent pacemaker 2D echocardiogram from 2016 showed grade 2 diastolic dysfunction.  Of note BNP significantly elevated but CT without evidence of pulmonary edema and there is no evidence of volume overload on exam. Hold Lasix  given AKI  Diabetes mellitus type 2 not on insulin  Hypoglycemia possibly secondary to poor oral intake. Stopping Farxiga  given recurrent UTI, change sliding scale to with meals  Elevated troponin Flat, no clinical significance, elevation probably related to AKI.  EKG nonacute. Demand ischemia ruled out  Alzheimer's dementia Stable.  Delirium precautions.  Chronic pain syndrome Can continue Lortab  Class 1 obesity Body mass index is 34.78 kg/m.      Subjective:  Feels ok Patient lives alone but son stays with her at  night and daughter checks in on her daily.  Has been doing well at home, ambulates with a rollator.  No recent falls.  Spends most of the day watching TV.  No weight loss.  Does not need enough per daughter.  Physical Exam: Vitals:   06/10/24 0324 06/10/24 0749 06/10/24 1229 06/10/24 1520  BP: (!) 108/54 124/61 131/60 124/64  Pulse: 67 71 73 63  Resp: 17  18 18   Temp: 98.4 F (36.9 C) 98.2 F (36.8 C) 98.8 F (37.1 C) 98.3 F (36.8 C)  TempSrc: Oral Oral Oral Oral  SpO2: 95% 96% 96% 97%  Weight:      Height:       Physical Exam Vitals reviewed.  Constitutional:      General: She is not in acute distress.    Appearance: She is not ill-appearing or toxic-appearing.  Cardiovascular:     Rate and Rhythm: Normal rate and regular rhythm.     Heart sounds: No murmur heard. Pulmonary:     Effort: Pulmonary effort is normal. No respiratory distress.     Breath sounds: No wheezing, rhonchi or rales.  Neurological:     Mental Status: She is alert.  Psychiatric:        Mood and Affect: Mood normal.        Behavior: Behavior normal.     Data Reviewed: CBG stable Creatinine down to 2.5 Troponins elevated 76, 73, BNP elevated Lactic acid within normal limits Hemoglobin stable 8.7, compared to previous in June Chest x-ray, CT head and CT cervical spine no acute abnormalities 2D echocardiogram reassuring with normal LVEF, no wall motion abnormalities EKG paced  Family Communication: Daughter at bedside  Disposition: Status is: Inpatient Remains inpatient appropriate because: AKI     Time spent: 13  minutes  Author: Toribio Door, MD 06/10/2024 4:29 PM  For on call review [email or web address].

## 2024-06-10 NOTE — Evaluation (Signed)
 Occupational Therapy Evaluation Patient Details Name: Krista Clarke MRN: [id number] DOB: 1940/12/04 Today's Date: 06/10/2024   History of Present Illness   83 yr old female who presents to the ED for evaluation of generalized weakness, recent fall 3 days ago, and poor p.o. intake. CT chest showing small right effusion and right basilar atelectasis.  CT abdomen pelvis showing bilateral hydronephrosis and hydroureter secondary to over distended bladder. Admitted with UTI, AKI on CKD. PMH:  Alzheimer's dementia, type 2 diabetes with peripheral neuropathy, chronic low back pain with bilateral sciatica, hypertension, hyperlipidemia, CKD stage IIIa, CHB status post PPM, anxiety/mood disorder, GERD, gout, chronic anemia     Clinical Impressions The pt is currently limited by the below listed deficits (see OT problem list). Overall, she is presenting below her baseline level of functioning for self-care management. As such, her occupational performance is compromised and she requires assist for self-care management. During the session today, she was also noted to be with generalized weakness, deconditioning, reports of R shoulder discomfort with ROM testing, and lethargy. Given her increased lethargy, attempts were deferred at supine to sit and progressive out of bed ADLs. She will benefit from further OT services to maximize her independence with ADLs & to decrease the risk for restricted participation in other. meaningful activities. The pt's daughter desires and plans to take the pt home with home therapy and family assist at discharge.     If plan is discharge home, recommend the following:   Assist for transportation;Assistance with cooking/housework;Help with stairs or ramp for entrance;A lot of help with bathing/dressing/bathroom;A lot of help with walking and/or transfers;Direct supervision/assist for financial management;Direct supervision/assist for medications management     Functional Status  Assessment   Patient has had a recent decline in their functional status and demonstrates the ability to make significant improvements in function in a reasonable and predictable amount of time.     Equipment Recommendations   None recommended by OT     Recommendations for Other Services         Precautions/Restrictions   Precautions Precautions: Fall Restrictions Weight Bearing Restrictions Per Provider Order: No     Mobility Bed Mobility      General bed mobility comments: supine to sit deferred, due to pt being limited by noted lethargy        Balance       Sitting balance - Comments: unable to assess this date           ADL either performed or assessed with clinical judgement   ADL Overall ADL's : Needs assistance/impaired Eating/Feeding: Minimal assistance;Bed level   Grooming: Minimal assistance;Bed level   Upper Body Bathing: Minimal assistance;Bed level   Lower Body Bathing: Moderate assistance;Bed level   Upper Body Dressing : Moderate assistance;Bed level   Lower Body Dressing: Maximal assistance;Bed level       Toileting- Clothing Manipulation and Hygiene: Moderate assistance;Bed level                            Pertinent Vitals/Pain Pain Assessment Pain Assessment: Faces (slight R shoulder discomfort with ROM testing) Pain Score: 3  Pain Intervention(s): Monitored during session, Limited activity within patient's tolerance     Extremity/Trunk Assessment Upper Extremity Assessment Upper Extremity Assessment: Right hand dominant;RUE deficits/detail;LUE deficits/detail;Generalized weakness RUE Deficits / Details: Required AAROM for end range of motion of the shoulder, discomfort with testing shoulder flexion. Grip strength grossly 3+/5 LUE Deficits / Details:  AROM WFL. Grip strength grossly 3+/5   Lower Extremity Assessment Lower Extremity Assessment: Generalized weakness;RLE deficits/detail;LLE deficits/detail RLE  Deficits / Details: required AAROM for formal ROM testing in bed LLE Deficits / Details: required AAROM for formal ROM testing in bed       Communication Communication Communication: No apparent difficulties   Cognition Arousal: Lethargic Behavior During Therapy: Flat affect Cognition: History of cognitive impairments             OT - Cognition Comments: Pt's daughter reported pt has a history of  a little dementia. Pt was oriented to person, to being in the hospital with cues, city, and year. Disoriented to month                 Following commands:  (Able to follow 1 step commands, with occasional light repetition, though this may have been due to pt being lethargic)       Cueing  General Comments   Cueing Techniques: Verbal cues              Home Living Family/patient expects to be discharged to:: Private residence Living Arrangements: Children (Son who works during the day) Available Help at Discharge: Family;Available 24 hours/day Type of Home: House Home Access: Stairs to enter [employer] of Steps:  (4) Entrance Stairs-Rails: Left;Right Home Layout: One level        Home Equipment: Shower seat - built Charity fundraiser (2 wheels);Rollator (4 wheels);Transport chair;BSC/3in1          Prior Functioning/Environment Prior Level of Function : Independent/Modified Independent;Needs assist             Mobility Comments: walks with rollator stairs have been getting more difficult lately ADLs Comments: no driving; dtr does grocery shopping, bathes with supervision, dtr assists with dressing, toileting I'ly,  daughter(s) does all meals, daughter organizes meds (No driving; modified independent with dressing and toileting, daughter does grocery shopping, bathes with supervision, family does all meals, daughter organizes meds)    OT Problem List: Decreased strength;Decreased activity tolerance;Impaired balance (sitting and/or  standing);Decreased range of motion;Decreased cognition;Decreased knowledge of use of DME or AE;Pain   OT Treatment/Interventions: Self-care/ADL training;Therapeutic exercise;Therapeutic activities;Cognitive remediation/compensation;Energy conservation;DME and/or AE instruction;Patient/family education;Balance training      OT Goals(Current goals can be found in the care plan section)   Acute Rehab OT Goals Patient Stated Goal: pt's daughter desires to take the pt home with home therapy and family assist at discharge OT Goal Formulation: With patient/family Time For Goal Achievement: 06/24/24 Potential to Achieve Goals: Good ADL Goals Pt Will Perform Eating: with set-up;sitting Pt Will Perform Grooming: with set-up;sitting Pt Will Perform Upper Body Dressing: with set-up;sitting Pt Will Perform Lower Body Dressing: with supervision;with set-up;sitting/lateral leans;sit to/from stand Pt Will Transfer to Toilet: with supervision;with set-up;ambulating   OT Frequency:  Min 2X/week       AM-PAC OT 6 Clicks Daily Activity     Outcome Measure Help from another person eating meals?: A Little Help from another person taking care of personal grooming?: A Little Help from another person toileting, which includes using toliet, bedpan, or urinal?: A Lot Help from another person bathing (including washing, rinsing, drying)?: A Lot Help from another person to put on and taking off regular upper body clothing?: A Little Help from another person to put on and taking off regular lower body clothing?: A Lot 6 Click Score: 15   End of Session Equipment Utilized During Treatment: Other (comment) (N/A) Nurse Communication:  Mobility status  Activity Tolerance: Patient limited by lethargy Patient left: in bed;with call bell/phone within reach;with bed alarm set;with family/visitor present  OT Visit Diagnosis: Muscle weakness (generalized) (M62.81);Pain;Other abnormalities of gait and mobility  (R26.89);History of falling (Z91.81) Pain - Right/Left: Right Pain - part of body: Shoulder                Time: 8284-8267 OT Time Calculation (min): 17 min Charges:  OT General Charges $OT Visit: 1 Visit OT Evaluation $OT Eval Moderate Complexity: 1 Mod    Arika Mainer J Harris, OTR/L 06/10/2024, 5:54 PM

## 2024-06-10 NOTE — Progress Notes (Signed)
 Echocardiogram 2D Echocardiogram has been performed with definity.  Shar Paez N Chaun Uemura,RDCS 06/10/2024, 12:14 PM

## 2024-06-10 NOTE — Hospital Course (Addendum)
 Patient is a 83 year old female with PMH of Alzheimer's dementia, diabetes, pacemaker placement, presented to the hospital with  generalized weakness, fall.  Patient was initially admitted hospital for  UTI, AKI, acute urinary retention, bilateral hydronephrosis and hydroureter.  Foley catheter placed in the emergency department for acute urinary retention.  AKI improved with IV fluids.  PT saw the patient during hospitalization and recommended skilled nursing facility placement but patient and family has declined.  Assessment and Plan: AKI superimposed on CKD stage IIIa Acute urinary retention Bilateral hydronephrosis, hydroureter E coli UTI Initially Foley catheter was placed and with return of pus.  At this time has received antibiotics with improvement in her symptoms.  Renal function has improved.  Losartan  and Lasix  on hold including Farxiga .  Foley catheter was discontinued yesterday but has failed voiding trial and has retained again. Patient has received In-N-Out cath again.  Acute CHF ruled out Chronic diastolic CHF PMH complete heart block, status post permanent pacemaker  2D echocardiogram from 2016 showed grade 2 diastolic dysfunction.  Lasix  currently on hold due to AKI.   Diabetes mellitus type 2 not on insulin  Hypoglycemia possibly secondary to poor oral intake. Review of recent hemoglobin A1c of 6.9 in July 2025.  Hold Farxiga  for now due to recurrent UTI.  Received sliding scale insulin  while in the hospital.  Was on glipizide  at home will consider discontinuation and watching due to high risk for hypoglycemia.    Elevated troponin Flat troponins without chest pain and EKG changes.  Likely secondary to AKI.     Alzheimer's dementia Stable.  Delirium precautions.  Patient wishes to live by herself at home.   Chronic pain syndrome Continue Lortab.   Class 1 obesity Body mass index is 34.78 kg/m.  Patient might benefit from lifestyle modification.

## 2024-06-10 NOTE — Evaluation (Signed)
 Physical Therapy Evaluation Patient Details Name: Krista Clarke MRN: [id number] DOB: Dec 17, 1940 Today's Date: 06/10/2024  History of Present Illness  83 y.o. female presents to the ED today for evaluation of generalized weakness, recent fall 3 days ago, and poor p.o. intake. CT chest showing small right effusion and right basilar atelectasis.  CT abdomen pelvis showing bilateral hydronephrosis and hydroureter secondary to over distended bladder. admitted with UTI, AKI on CKD. PMH:  Alzheimer's dementia, type 2 diabetes with peripheral neuropathy, chronic low back pain with bilateral sciatica, hypertension, hyperlipidemia, CKD stage IIIa, CHB status post PPM, anxiety/mood disorder, GERD, gout, chronic anemia  Clinical Impression  Pt admitted with above diagnosis.  Pt very sleepy but arouses to voice, slightly more alert once sitting  EOB. Pt amb with rollator at baseline, lives with son dtr assists with ADLs/iADLs.  Today pt required 2 person min assist to mod assist of 1 for bed mobility and transfers;  Recommend HHPT   Pt currently with functional limitations due to the deficits listed below (see PT Problem List). Pt will benefit from acute skilled PT to increase their independence and safety with mobility to allow discharge.           If plan is discharge home, recommend the following: A little help with walking and/or transfers;A little help with bathing/dressing/bathroom;Help with stairs or ramp for entrance;Assist for transportation;Assistance with cooking/housework;Supervision due to cognitive status   Can travel by private vehicle        Equipment Recommendations None recommended by PT  Recommendations for Other Services       Functional Status Assessment Patient has had a recent decline in their functional status and demonstrates the ability to make significant improvements in function in a reasonable and predictable amount of time.     Precautions / Restrictions  Precautions Precautions: Fall Restrictions Weight Bearing Restrictions Per Provider Order: No      Mobility  Bed Mobility Overal bed mobility: Needs Assistance Bed Mobility: Supine to Sit     Supine to sit: Min assist, Mod assist, Used rails     General bed mobility comments: incr time and effort, tactile cues to progress LEs off bed. assist to elevate trunk; bed pad used to assist wtih lateral scooting and pivot hips    Transfers Overall transfer level: Needs assistance Equipment used: Rolling walker (2 wheels) Transfers: Sit to/from Stand, Bed to chair/wheelchair/BSC Sit to Stand: Min assist, Mod assist, +2 physical assistance, +2 safety/equipment   Step pivot transfers: Min assist, +2 physical assistance, +2 safety/equipment       General transfer comment: cues for hand placement and to control descent to chair. assist to balance and maneuver RW. incr time needed    Ambulation/Gait               General Gait Details: deferred d/t pt very sleepy and also  reporting slight dizziness  Stairs            Wheelchair Mobility     Tilt Bed    Modified Rankin (Stroke Patients Only)       Balance Overall balance assessment: Needs assistance Sitting-balance support: Feet supported, No upper extremity supported Sitting balance-Leahy Scale: Fair     Standing balance support: During functional activity, Reliant on assistive device for balance Standing balance-Leahy Scale: Poor Standing balance comment: reliant on assist and RW support  Pertinent Vitals/Pain Pain Assessment Pain Assessment: No/denies pain    Home Living Family/patient expects to be discharged to:: Private residence Living Arrangements: Children (son) Available Help at Discharge: Family;Available PRN/intermittently Type of Home: House Home Access: Stairs to enter   Entrance Stairs-Number of Steps: 4 front(8-10 in back)   Home Layout: One  level Home Equipment: Educational psychologist (4 wheels);Rolling Walker (2 wheels);Cane - single point;BSC/3in1 Additional Comments: family can provide incr assist per pt dtr    Prior Function Prior Level of Function : Independent/Modified Independent             Mobility Comments: walks with rollator stairs have been getting more difficult lately ADLs Comments: no driving; dtr does grocery shopping, bathes with supervision, dtr assists with dressing, toileting I'ly,  daughter(s) does all meals, daughter organizes meds     Extremity/Trunk Assessment   Upper Extremity Assessment Upper Extremity Assessment: Defer to OT evaluation    Lower Extremity Assessment Lower Extremity Assessment: Generalized weakness       Communication   Communication Communication: No apparent difficulties    Cognition Arousal: Alert Behavior During Therapy: WFL for tasks assessed/performed   PT - Cognitive impairments: No apparent impairments                         Following commands: Intact       Cueing       General Comments      Exercises     Assessment/Plan    PT Assessment Patient needs continued PT services  PT Problem List Decreased strength;Decreased activity tolerance;Decreased balance;Decreased mobility;Decreased knowledge of use of DME;Decreased cognition;Decreased safety awareness       PT Treatment Interventions DME instruction;Therapeutic exercise;Gait training;Functional mobility training;Therapeutic activities;Patient/family education    PT Goals (Current goals can be found in the Care Plan section)  Acute Rehab PT Goals Patient Stated Goal: home soon PT Goal Formulation: With patient/family Time For Goal Achievement: 06/24/24 Potential to Achieve Goals: Good    Frequency Min 3X/week     Co-evaluation               AM-PAC PT 6 Clicks Mobility  Outcome Measure Help needed turning from your back to your side while in a flat bed without  using bedrails?: A Little Help needed moving from lying on your back to sitting on the side of a flat bed without using bedrails?: A Little Help needed moving to and from a bed to a chair (including a wheelchair)?: A Little Help needed standing up from a chair using your arms (e.g., wheelchair or bedside chair)?: A Little Help needed to walk in hospital room?: A Lot Help needed climbing 3-5 steps with a railing? : A Lot 6 Click Score: 16    End of Session Equipment Utilized During Treatment: Gait belt Activity Tolerance: Patient tolerated treatment well Patient left: in chair;with call bell/phone within reach;with chair alarm set;with family/visitor present Nurse Communication: Mobility status PT Visit Diagnosis: Other abnormalities of gait and mobility (R26.89)    Time: 8747-8683 PT Time Calculation (min) (ACUTE ONLY): 24 min   Charges:   PT Evaluation $PT Eval Low Complexity: 1 Low PT Treatments $Therapeutic Activity: 8-22 mins PT General Charges $$ ACUTE PT VISIT: 1 Visit         Gracie Gupta, PT  Acute Rehab Dept [facility]) [phone]  06/10/2024   [facility] 06/10/2024, 1:33 PM

## 2024-06-11 ENCOUNTER — Encounter (HOSPITAL_COMMUNITY): Payer: Self-pay | Admitting: Internal Medicine

## 2024-06-11 DIAGNOSIS — N179 Acute kidney failure, unspecified: Secondary | ICD-10-CM | POA: Diagnosis not present

## 2024-06-11 DIAGNOSIS — N1832 Chronic kidney disease, stage 3b: Secondary | ICD-10-CM | POA: Diagnosis not present

## 2024-06-11 DIAGNOSIS — E119 Type 2 diabetes mellitus without complications: Secondary | ICD-10-CM | POA: Diagnosis not present

## 2024-06-11 LAB — GLUCOSE, CAPILLARY
Glucose-Capillary: 171 mg/dL — ABNORMAL HIGH (ref 70–99)
Glucose-Capillary: 166 mg/dL — ABNORMAL HIGH (ref 70–99)
Glucose-Capillary: 162 mg/dL — ABNORMAL HIGH (ref 70–99)
Glucose-Capillary: 164 mg/dL — ABNORMAL HIGH (ref 70–99)

## 2024-06-11 LAB — BASIC METABOLIC PANEL WITH GFR
Anion gap: 9 (ref 5–15)
BUN: 53 mg/dL — ABNORMAL HIGH (ref 8–23)
CO2: 26 mmol/L (ref 22–32)
Calcium: 8.9 mg/dL (ref 8.9–10.3)
Chloride: 104 mmol/L (ref 98–111)
Creatinine, Ser: 1.82 mg/dL — ABNORMAL HIGH (ref 0.44–1.00)
GFR, Estimated: 27 mL/min — ABNORMAL LOW (ref >60)
Glucose, Bld: 161 mg/dL — ABNORMAL HIGH (ref 70–99)
Potassium: 4.3 mmol/L (ref 3.5–5.1)
Sodium: 139 mmol/L (ref 135–145)

## 2024-06-11 LAB — MAGNESIUM: Magnesium: 3.2 mg/dL — ABNORMAL HIGH (ref 1.7–2.4)

## 2024-06-11 MED ORDER — INSULIN ASPART 100 UNIT/ML IJ SOLN
0.0000 [IU] | Freq: Three times a day (TID) | INTRAMUSCULAR | Status: DC
  Administered 2024-06-11 (×2): 2 [IU] via SUBCUTANEOUS
  Administered 2024-06-12 (×2): 1 [IU] via SUBCUTANEOUS
  Administered 2024-06-12 – 2024-06-13 (×2): 2 [IU] via SUBCUTANEOUS
  Administered 2024-06-13: 1 [IU] via SUBCUTANEOUS
  Administered 2024-06-13: 2 [IU] via SUBCUTANEOUS
  Administered 2024-06-14 (×2): 1 [IU] via SUBCUTANEOUS

## 2024-06-11 MED ORDER — HYDROCODONE-ACETAMINOPHEN 5-325 MG PO TABS
1.0000 | ORAL_TABLET | Freq: Two times a day (BID) | ORAL | Status: DC | PRN
  Administered 2024-06-12 – 2024-06-13 (×3): 1 via ORAL
  Filled 2024-06-11 (×3): qty 1

## 2024-06-11 MED ORDER — SENNOSIDES-DOCUSATE SODIUM 8.6-50 MG PO TABS
1.0000 | ORAL_TABLET | Freq: Two times a day (BID) | ORAL | Status: DC
  Administered 2024-06-11 – 2024-06-14 (×7): 1 via ORAL
  Filled 2024-06-11 (×7): qty 1

## 2024-06-11 MED ORDER — LACTATED RINGERS IV SOLN: INTRAVENOUS | Status: AC

## 2024-06-11 MED ORDER — ACETAMINOPHEN 325 MG PO TABS
650.0000 mg | ORAL_TABLET | Freq: Four times a day (QID) | ORAL | Status: DC | PRN
Start: 2024-06-11 — End: 2024-06-14

## 2024-06-11 NOTE — Progress Notes (Signed)
 Mobility Specialist Progress Note:    06/11/24 1510  Mobility  Activity Pivoted/transferred from bed to chair  Level of Assistance Moderate assist, patient does 50-74%  Assistive Device Front wheel walker  Distance Ambulated (ft) 3 ft  Activity Response Tolerated fair  Mobility Referral Yes  Mobility visit 1 Mobility  Mobility Specialist Start Time (ACUTE ONLY) 1450  Mobility Specialist Stop Time (ACUTE ONLY) 1505  Mobility Specialist Time Calculation (min) (ACUTE ONLY) 15 min   Pt was received in bed and agreed to mobility. Mod A to edge of bed, sit to stand and transfer due to posterior lean. Expressed dizziness but later subsided. Returned to chair with all needs met. Call bell in reach.  [employer] - Mobility Specialist

## 2024-06-11 NOTE — Inpatient Diabetes Management (Signed)
 Inpatient Diabetes Program Recommendations  AACE/ADA: New Consensus Statement on Inpatient Glycemic Control (2015)  Target Ranges:  Prepandial:   less than 140 mg/dL      Peak postprandial:   less than 180 mg/dL (1-2 hours)      Critically ill patients:  140 - 180 mg/dL   Lab Results  Component Value Date   GLUCAP 162 (H) 06/11/2024   HGBA1C 6.9 (A) 03/08/2024    Review of Glycemic Control  Diabetes history: DM2 Outpatient Diabetes medications: Farxiga  10 QAM Current orders for Inpatient glycemic control: Novolog  0-9 TID  HgbA1C - 6.9%  Inpatient Diabetes Program Recommendations:   Consider decreasing Novolog  to 0-6 TID.  For discharge:   GLP-1 such as Ozempic 0.25 mg weekly may be reasonable. It's FDA-approved to reduce risk of CKD worsening  Glipizide  2.5 mg QAM (may be too risky for hypoglycemia if not eating well)   HgbA1C 6.9% - may not need meds, concentrate on healthy diet with portion control, no sweetened beverages such as juice, soda.   Continue to follow.  Thank you. Shona Brandy, RD, LDN, CDCES Inpatient Diabetes Coordinator [phone]

## 2024-06-11 NOTE — Progress Notes (Signed)
  Progress Note   Patient: Krista Clarke FMW:[id number] DOB: 1940-12-20 DOA: 06/09/2024     2 DOS: the patient was seen and examined on 06/11/2024   Brief hospital course: 83 year old woman PMH including Alzheimer's dementia, diabetes, pacemaker placement, presenting with generalized weakness, recent assisted slide/fall without apparent injury, who was admitted for UTI, AKI, acute urinary retention, bilateral hydronephrosis and hydroureter.  Foley catheter placed in the emergency department for acute urinary retention.  Consultants None  Procedures/Events 10/11 admission  Assessment and Plan: AKI superimposed on CKD stage IIIa Acute urinary retention Bilateral hydronephrosis, hydroureter E coli UTI Urinary retention, obstructive uropathy noted on CT, Foley catheter placed with return of urine and pus Renal function improving.  Will give a bit more fluids and recheck BMP in AM. Continue empiric antibiotics, continue to hold losartan , furosemide .  Stopped Farxiga  given this is her second UTI. Pharmacy review of medications for potential culprits causing urinary retention   Acute CHF ruled out Chronic diastolic CHF PMH complete heart block, status post permanent pacemaker 2D echocardiogram from 2016 showed grade 2 diastolic dysfunction.  Of note BNP significantly elevated but CT without evidence of pulmonary edema and there is no evidence of volume overload on exam. Hold Lasix  given AKI   Diabetes mellitus type 2 not on insulin  Hypoglycemia possibly secondary to poor oral intake. Stopped Farxiga  given recurrent UTI, change sliding scale to with meals A1c 6.9 in July Consult DM RN coordinator   Elevated troponin Flat, no clinical significance, elevation probably related to AKI.  EKG nonacute. Demand ischemia ruled out   Alzheimer's dementia Stable.  Delirium precautions.   Chronic pain syndrome Can continue Lortab   Class 1 obesity Body mass index is 34.78  kg/m.  Disposition From: home with family support Anticipated: same  Overall improving, a bit more fluids and check BMP in a.m., continue antibiotic, alternative diabetic medication on discharge     Subjective:  Had a BM, feels better Poor appetite  Physical Exam: Vitals:   06/10/24 1520 06/10/24 1822 06/10/24 2157 06/11/24 0458  BP: 124/64 (!) 116/48 (!) 140/58 (!) 129/54  Pulse: 63 69 73 69  Resp: 18 16 17 17   Temp: 98.3 F (36.8 C) 99.3 F (37.4 C) 98.8 F (37.1 C) 98.2 F (36.8 C)  TempSrc: Oral Oral Oral Oral  SpO2: 97% 95% 92% 96%  Weight:      Height:       Physical Exam Vitals reviewed.  Constitutional:      General: She is not in acute distress.    Appearance: She is not ill-appearing or toxic-appearing.  Cardiovascular:     Rate and Rhythm: Normal rate and regular rhythm.     Heart sounds: No murmur heard. Pulmonary:     Effort: Pulmonary effort is normal. No respiratory distress.     Breath sounds: No wheezing, rhonchi or rales.  Neurological:     Mental Status: She is alert.  Psychiatric:        Mood and Affect: Mood normal.        Behavior: Behavior normal.     Data Reviewed: CBG stable, with some highs to 200s Creatinine down to 1.82, BUN down to 53 Urine culture 100,000 E coli  Family Communication: none  Disposition: Status is: Inpatient Remains inpatient appropriate because: AKI     Time spent: 20 minutes  Author: Toribio Door, MD 06/11/2024 8:01 AM  For on call review [email or web address].

## 2024-06-11 NOTE — Plan of Care (Signed)

## 2024-06-11 NOTE — Plan of Care (Signed)
   Problem: Nutrition: Goal: Adequate nutrition will be maintained Outcome: Progressing   Problem: Pain Managment: Goal: General experience of comfort will improve and/or be controlled Outcome: Progressing   Problem: Safety: Goal: Ability to remain free from injury will improve Outcome: Progressing

## 2024-06-11 NOTE — Progress Notes (Addendum)
 Transferred pt from chair back to bed. Transfer required 2 assist with max cues. Pt able to stand with 2 assist, however struggled to follow commands to ambulate to bed ultimately requiring a STEDY for safe transfer.   MD aware and requesting PT re-eval. Pt lives at home independently prior to hospitalization. Son lives in the home and daughters check in frequently.

## 2024-06-12 DIAGNOSIS — E119 Type 2 diabetes mellitus without complications: Secondary | ICD-10-CM | POA: Diagnosis not present

## 2024-06-12 DIAGNOSIS — N390 Urinary tract infection, site not specified: Secondary | ICD-10-CM | POA: Diagnosis not present

## 2024-06-12 DIAGNOSIS — N179 Acute kidney failure, unspecified: Secondary | ICD-10-CM | POA: Diagnosis not present

## 2024-06-12 LAB — URINE CULTURE: Culture: >=100000 — AB

## 2024-06-12 LAB — BASIC METABOLIC PANEL WITH GFR
Anion gap: 8 (ref 5–15)
BUN: 36 mg/dL — ABNORMAL HIGH (ref 8–23)
CO2: 26 mmol/L (ref 22–32)
Calcium: 8.6 mg/dL — ABNORMAL LOW (ref 8.9–10.3)
Chloride: 103 mmol/L (ref 98–111)
Creatinine, Ser: 1.52 mg/dL — ABNORMAL HIGH (ref 0.44–1.00)
GFR, Estimated: 33 mL/min — ABNORMAL LOW (ref >60)
Glucose, Bld: 129 mg/dL — ABNORMAL HIGH (ref 70–99)
Potassium: 4.9 mmol/L (ref 3.5–5.1)
Sodium: 138 mmol/L (ref 135–145)

## 2024-06-12 LAB — GLUCOSE, CAPILLARY
Glucose-Capillary: 130 mg/dL — ABNORMAL HIGH (ref 70–99)
Glucose-Capillary: 168 mg/dL — ABNORMAL HIGH (ref 70–99)
Glucose-Capillary: 146 mg/dL — ABNORMAL HIGH (ref 70–99)

## 2024-06-12 LAB — MAGNESIUM: Magnesium: 2.6 mg/dL — ABNORMAL HIGH (ref 1.7–2.4)

## 2024-06-12 MED ORDER — LACTATED RINGERS IV SOLN: INTRAVENOUS | Status: DC

## 2024-06-12 MED ORDER — CEFADROXIL 500 MG PO CAPS
500.0000 mg | ORAL_CAPSULE | Freq: Every day | ORAL | Status: DC
  Administered 2024-06-12: 500 mg via ORAL
  Filled 2024-06-12: qty 1

## 2024-06-12 NOTE — Plan of Care (Signed)
  Problem: Safety: Goal: Ability to remain free from injury will improve Outcome: Progressing   Problem: Coping: Goal: Ability to adjust to condition or change in health will improve Outcome: Progressing   Problem: Nutritional: Goal: Maintenance of adequate nutrition will improve Outcome: Progressing

## 2024-06-12 NOTE — Progress Notes (Addendum)
 Progress Note   Patient: Krista Clarke FMW:[id number] DOB: Dec 12, 1940 DOA: 06/09/2024     3 DOS: the patient was seen and examined on 06/12/2024   Brief hospital course: 83 year old woman PMH including Alzheimer's dementia, diabetes, pacemaker placement, presenting with generalized weakness, recent assisted slide/fall without apparent injury, who was admitted for UTI, AKI, acute urinary retention, bilateral hydronephrosis and hydroureter.  Foley catheter placed in the emergency department for acute urinary retention.  AKI improving with fluids.  SNF recommended but family declines, patient will return home.  Consultants None  Procedures/Events 10/11 admission  Assessment and Plan: AKI superimposed on CKD stage IIIa Acute urinary retention Bilateral hydronephrosis, hydroureter E coli UTI Urinary retention, obstructive uropathy noted on CT, Foley catheter placed with return of urine and pus Renal function improving.  Will give a bit more fluids and recheck BMP in AM. Continue antibiotic, continue to hold losartan , furosemide .  Stopped Farxiga  given this is her second UTI. 10/14 will discontinue Foley, pursue voiding trial   Acute CHF ruled out Chronic diastolic CHF PMH complete heart block, status post permanent pacemaker 2D echocardiogram from 2016 showed grade 2 diastolic dysfunction.  Of note BNP significantly elevated but CT without evidence of pulmonary edema and there is no evidence of volume overload on exam. Hold Lasix  given AKI   Diabetes mellitus type 2 not on insulin  Hypoglycemia possibly secondary to poor oral intake. Stopped Farxiga  given recurrent UTI, change sliding scale to with meals A1c 6.9 in July Consulted DM RN coordinator--could consider GLP-1, will defer this to her PCP.  Hesitant to prescribe something like glimepiride out of concern for hypoglycemia.  Hemoglobin A1c was less than 7, would be reasonable to trial off medication and follow-up closely.   Elevated  troponin Flat, no clinical significance, elevation probably related to AKI.  EKG nonacute. Demand ischemia ruled out, AKI.  AKI improving with fluids.  AKI improving with fluids.   Alzheimer's dementia Stable.  Delirium precautions.   Chronic pain syndrome Can continue Lortab   Class 1 obesity Body mass index is 34.78 kg/m.   Disposition From: home with family support Anticipated: same, SNF recommended but family declines this option.  Overall improving, check BMP in a.m., anticipate discharge home if renal function improved.    Subjective:  Feels weak today, some left leg pain after fall  Physical Exam: Vitals:   06/11/24 1328 06/11/24 2202 06/12/24 0642 06/12/24 1222  BP: (!) 118/52 133/60 (!) 136/58 (!) 160/69  Pulse: 71 78 71 76  Resp: 18 16 16 14   Temp: 97.9 F (36.6 C) 98.5 F (36.9 C) 98.1 F (36.7 C) 97.9 F (36.6 C)  TempSrc: Oral Oral  Oral  SpO2: 100% 97% 98% 99%  Weight:      Height:       Physical Exam Vitals reviewed.  Constitutional:      General: She is not in acute distress.    Appearance: She is not ill-appearing.  Cardiovascular:     Rate and Rhythm: Normal rate and regular rhythm.     Heart sounds: No murmur heard. Pulmonary:     Effort: Pulmonary effort is normal. No respiratory distress.     Breath sounds: No wheezing, rhonchi or rales.  Neurological:     Mental Status: She is alert.  Psychiatric:        Mood and Affect: Mood normal.        Behavior: Behavior normal.     Data Reviewed: Here and output 2250 Creatinine down to  1.52  Family Communication:   Disposition: Status is: Inpatient Remains inpatient appropriate because: AKI     Time spent: 35 minutes  Author: Toribio Door, MD 06/12/2024 2:27 PM  For on call review [email or web address].

## 2024-06-12 NOTE — Plan of Care (Signed)

## 2024-06-12 NOTE — TOC Transition Note (Signed)
 Transition of Care [facility]) - Discharge Note   Patient Details  Name: Krista Clarke MRN: [id number] Date of Birth: July 16, 1941  Transition of Care [facility] CM/SW Contact:  NORMAN ASPEN, LCSW Phone Number: 06/12/2024, 3:11 PM   Clinical Narrative:     Alerted by PT that patient making only slow gains and may need short term SNF.  Met with pt and have spoken with pt's daughter, [name], to review pt's current functional levels/ assistance needs.  Per MD, pt approaching medical readiness for dc and need to confirm 24/7 support in the home.  Have reviewed PT documentation with daughter and stressed that it is recommended that she have 24/7 care in the home vs. Possible ST SNF.  Pt and daughter prefer to dc home. Daughter reports they have secured a caregiver who can be with pt when family unable.  Daughter confirms pt will have 24/7 caregivers at home.   Reviewed HHPT/OT recommendations and daughter requests a change in [facility] agency from [place] but no preference.  Have been able to secure [facility] coverage with [facility].  Pt has needed DME in the home.  No further IP CM needs.  Final next level of care: Home w Home Health Services Barriers to Discharge: Barriers Resolved   Patient Goals and CMS Choice Patient states their goals for this hospitalization and ongoing recovery are:: return home          Discharge Placement                       Discharge Plan and Services Additional resources added to the After Visit Summary for                  DME Arranged: N/A DME Agency: NA       HH Arranged: PT, OT HH Agency: [facility] Date [facility] Contacted: 06/12/24 Time HH Agency Contacted: 1511 Representative spoke with at [facility] Agency: Cindie  Social Drivers of Health (SDOH) Interventions SDOH Screenings   Food Insecurity: No Food Insecurity (06/09/2024)  Housing: Low Risk  (06/09/2024)  Transportation Needs: No Transportation Needs (06/09/2024)  Utilities: Not At Risk  (06/09/2024)  Depression (PHQ2-9): Low Risk  (02/24/2024)  Financial Resource Strain: Low Risk  (01/18/2023)  Physical Activity: Inactive (03/06/2021)  Social Connections: Socially Isolated (06/09/2024)  Stress: No Stress Concern Present (03/06/2021)  Tobacco Use: Low Risk  (05/15/2024)     Readmission Risk Interventions    06/12/2024    3:09 PM 02/21/2024   11:40 AM  Readmission Risk Prevention Plan  Transportation Screening Complete Complete  PCP or Specialist Appt within 5-7 Days  Complete  PCP or Specialist Appt within 3-5 Days Complete   Home Care Screening  Complete  Medication Review (RN CM)  Complete  Social Work Consult for Recovery Care Planning/Counseling Complete   Palliative Care Screening Not Applicable   Medication Review [employer]) Complete

## 2024-06-12 NOTE — Progress Notes (Signed)
 06/12/2024 4:16 PM -----------------------------------------------------------[facility]--------------------------------------------------- D(Data) A(Action) R(response)     Data: Discharge Readiness Assessment EDD listed for tomorrow 06/13/2024    Action: Chart reviewed    Response: No immediate Barriers to discharge identified at this time. ICM has arranged HH and family to provide caregiver at discharge.     Toshiro Hanken, RN [employer]

## 2024-06-12 NOTE — Progress Notes (Signed)
 Physical Therapy Treatment Patient Details Name: Krista Clarke MRN: [id number] DOB: 04/27/1941 Today's Date: 06/12/2024   History of Present Illness 83 y.o. female presents to the ED today for evaluation of generalized weakness, recent fall 3 days ago, and poor p.o. intake. CT chest showing small right effusion and right basilar atelectasis.  CT abdomen pelvis showing bilateral hydronephrosis and hydroureter secondary to over distended bladder. admitted with UTI, AKI on CKD. PMH:  Alzheimer's dementia, type 2 diabetes with peripheral neuropathy, chronic low back pain with bilateral sciatica, hypertension, hyperlipidemia, CKD stage IIIa, CHB status post PPM, anxiety/mood disorder, GERD, gout, chronic anemia    PT Comments  Pt ambulated 9' with RW with min assist for balance. +2 min/mod assist for sit to stand. Increased time for all mobility. Pt performed seated BUE/LE strengthening exercises. If family cannot provide needed level of care she may need ST-SNF.     If plan is discharge home, recommend the following: Help with stairs or ramp for entrance;Assist for transportation;Assistance with cooking/housework;Supervision due to cognitive status;A lot of help with walking and/or transfers;A lot of help with bathing/dressing/bathroom   Can travel by private vehicle     No  Equipment Recommendations  None recommended by PT    Recommendations for Other Services       Precautions / Restrictions Precautions Precautions: Fall Recall of Precautions/Restrictions: Intact Restrictions Weight Bearing Restrictions Per Provider Order: No     Mobility  Bed Mobility               General bed mobility comments: up in recliner    Transfers Overall transfer level: Needs assistance Equipment used: Rolling walker (2 wheels) Transfers: Sit to/from Stand Sit to Stand: Mod assist, +2 physical assistance, +2 safety/equipment           General transfer comment: verbal and manual cues for hand  placement and to control descent to chair. assist to balance 2* posterior lean initially upon standingand maneuver RW. incr time needed    Ambulation/Gait Ambulation/Gait assistance: Contact guard assist, Min assist Gait Distance (Feet): 9 Feet Assistive device: Rolling walker (2 wheels) Gait Pattern/deviations: Step-through pattern, Decreased stride length, Trunk flexed Gait velocity: decr     General Gait Details: increased time, VCs to lift head and to increase step length, intermittent min A for slight posterior lean   Stairs             Wheelchair Mobility     Tilt Bed    Modified Rankin (Stroke Patients Only)       Balance Overall balance assessment: Needs assistance Sitting-balance support: Feet supported, No upper extremity supported Sitting balance-Leahy Scale: Fair     Standing balance support: During functional activity, Reliant on assistive device for balance Standing balance-Leahy Scale: Poor Standing balance comment: reliant on assist and RW support, posterior lean initially upon standing requiring min A                            Communication Communication Communication: No apparent difficulties  Cognition Arousal: Alert Behavior During Therapy: WFL for tasks assessed/performed   PT - Cognitive impairments: No apparent impairments                         Following commands: Impaired Following commands impaired: Follows one step commands with increased time    Cueing Cueing Techniques: Verbal cues, Tactile cues  Exercises General Exercises - Lower Extremity Ankle  Circles/Pumps: AROM, Both, 15 reps, Seated Long Arc Quad: AROM, Both, 10 reps, Seated Hip Flexion/Marching: AROM, Both, 5 reps, Seated Shoulder Exercises Shoulder Flexion: AROM, Both, 10 reps, Seated    General Comments        Pertinent Vitals/Pain Pain Assessment Faces Pain Scale: Hurts little more Pain Location: L flank and L lateral thigh (pt  reports 2* soreness from a fall) Pain Descriptors / Indicators: Grimacing, Sore, Guarding Pain Intervention(s): Limited activity within patient's tolerance, Monitored during session, Repositioned, Premedicated before session    Home Living                          Prior Function            PT Goals (current goals can now be found in the care plan section) Acute Rehab PT Goals Patient Stated Goal: home soon, likes to read romance books PT Goal Formulation: With patient Time For Goal Achievement: 06/24/24 Potential to Achieve Goals: Good Progress towards PT goals: Progressing toward goals    Frequency    Min 3X/week      PT Plan      Co-evaluation              AM-PAC PT 6 Clicks Mobility   Outcome Measure  Help needed turning from your back to your side while in a flat bed without using bedrails?: A Little Help needed moving from lying on your back to sitting on the side of a flat bed without using bedrails?: A Lot Help needed moving to and from a bed to a chair (including a wheelchair)?: A Lot Help needed standing up from a chair using your arms (e.g., wheelchair or bedside chair)?: A Lot Help needed to walk in hospital room?: A Little Help needed climbing 3-5 steps with a railing? : Total 6 Click Score: 13    End of Session Equipment Utilized During Treatment: Gait belt Activity Tolerance: Patient limited by fatigue Patient left: in chair;with call bell/phone within reach;with nursing/sitter in room Nurse Communication: Mobility status PT Visit Diagnosis: Other abnormalities of gait and mobility (R26.89)     Time: 8877-8858 PT Time Calculation (min) (ACUTE ONLY): 19 min  Charges:    $Gait Training: 8-22 mins PT General Charges $$ ACUTE PT VISIT: 1 Visit                     Sylvan Delon Copp PT 06/12/2024  Acute Rehabilitation Services  Office [phone]

## 2024-06-13 DIAGNOSIS — N179 Acute kidney failure, unspecified: Secondary | ICD-10-CM | POA: Diagnosis not present

## 2024-06-13 LAB — BASIC METABOLIC PANEL WITH GFR
Anion gap: 9 (ref 5–15)
BUN: 22 mg/dL (ref 8–23)
CO2: 28 mmol/L (ref 22–32)
Calcium: 8.5 mg/dL — ABNORMAL LOW (ref 8.9–10.3)
Chloride: 104 mmol/L (ref 98–111)
Creatinine, Ser: 1.04 mg/dL — ABNORMAL HIGH (ref 0.44–1.00)
GFR, Estimated: 53 mL/min — ABNORMAL LOW (ref >60)
Glucose, Bld: 140 mg/dL — ABNORMAL HIGH (ref 70–99)
Potassium: 4.3 mmol/L (ref 3.5–5.1)
Sodium: 140 mmol/L (ref 135–145)

## 2024-06-13 LAB — GLUCOSE, CAPILLARY
Glucose-Capillary: 178 mg/dL — ABNORMAL HIGH (ref 70–99)
Glucose-Capillary: 133 mg/dL — ABNORMAL HIGH (ref 70–99)
Glucose-Capillary: 184 mg/dL — ABNORMAL HIGH (ref 70–99)
Glucose-Capillary: 168 mg/dL — ABNORMAL HIGH (ref 70–99)

## 2024-06-13 MED ORDER — CEFADROXIL 500 MG PO CAPS
500.0000 mg | ORAL_CAPSULE | Freq: Two times a day (BID) | ORAL | Status: DC
  Administered 2024-06-13 – 2024-06-14 (×3): 500 mg via ORAL
  Filled 2024-06-13 (×4): qty 1

## 2024-06-13 MED ORDER — TAMSULOSIN HCL 0.4 MG PO CAPS
0.4000 mg | ORAL_CAPSULE | Freq: Every day | ORAL | Status: DC
  Administered 2024-06-13: 0.4 mg via ORAL
  Filled 2024-06-13: qty 1

## 2024-06-13 NOTE — Progress Notes (Signed)
 Assisted patient up to the toilet with steady, however was not able to urinate, she was bladder scanned, with results of 751 mLs

## 2024-06-13 NOTE — Progress Notes (Signed)
 PROGRESS NOTE  CORLEY KOHLS FMW:[id number] DOB: Jan 27, 1941 DOA: 06/09/2024 PCP: Jodie Lavern LITTIE, MD   LOS: 4 days   Brief narrative:  Patient is a 83 year old female with PMH of Alzheimer's dementia, diabetes, pacemaker placement, presented to the hospital with  generalized weakness, fall.  Patient was initially admitted hospital for  UTI, AKI, acute urinary retention, bilateral hydronephrosis and hydroureter.  Foley catheter placed in the emergency department for acute urinary retention.  AKI improved with IV fluids.  PT saw the patient during hospitalization and recommended skilled nursing facility placement but patient and family has declined.    Assessment/Plan: Principal Problem:   AKI (acute kidney injury) Active Problems:   Non-insulin  dependent type 2 diabetes mellitus (HCC)   Essential hypertension   Pacemaker; 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  At this time has received antibiotics with improvement in her symptoms.  Renal function has improved.  Losartan  and Lasix  on hold including Farxiga .  Foley catheter was discontinued yesterday but has failed voiding trial and has retained again. Patient has received In-N-Out cath. Will place foley again, add flomax. Patient is concerned that she is by herself at home and doesn't feel comfortable managing foley . Will dc iv fluids today.   Acute CHF ruled out Chronic diastolic CHF PMH complete heart block, status post permanent pacemaker  2D echocardiogram from 2016 showed grade 2 diastolic dysfunction.  Lasix   currently on hold due to AKI.   Diabetes mellitus type 2 not on insulin  Hypoglycemia possibly secondary to poor oral intake. Review of recent hemoglobin A1c of 6.9 in July 2025.  Hold Farxiga  for now due to recurrent UTI.  Received sliding scale insulin  while in the hospital.  Was on glipizide  at home will consider discontinuation and watching due to high risk for hypoglycemia.    Elevated troponin Flat troponins without chest pain and EKG changes.  Likely secondary to AKI.     Alzheimer's dementia Stable.  Delirium precautions.  Patient wishes to live by herself at home.   Chronic pain syndrome Continue Lortab.   Class 1 obesity Body mass index is 34.78 kg/m.  Patient might benefit from lifestyle modification.   DVT prophylaxis: heparin  injection 5,000 Units Start: 06/10/24 0600   Disposition: Home Health versus SNF.  Status is: Inpatient Remains inpatient appropriate because: pending improvement, urinary retention.    Code Status:     Code Status: Full Code  Family Communication: None at bedside.   Consultants: None  Procedures: None  Anti-infectives:  Cefadroxil  Anti-infectives (From admission, onward)    Start     Dose/Rate Route Frequency Ordered Stop   06/13/24 1115  cefadroxil (DURICEF) capsule 500 mg        500 mg Oral 2 times daily 06/13/24 1028 06/17/24 0959   06/12/24 2200  cefadroxil (DURICEF) capsule 500 mg  Status:  Discontinued        500 mg Oral Daily at bedtime 06/12/24 1020 06/13/24 1028   06/10/24 2100  cefTRIAXone  (ROCEPHIN ) 1 g in sodium chloride  0.9 % 100 mL IVPB  Status:  Discontinued        1 g 200 mL/hr over 30 Minutes Intravenous Every 24 hours 06/09/24 2320 06/12/24 1020   06/09/24 2145  ceFEPIme  (MAXIPIME ) 2 g in sodium chloride  0.9 % 100 mL IVPB        2 g 200 mL/hr over 30 Minutes Intravenous  Once 06/09/24 2137 06/09/24 2255        Subjective: Today, patient was seen and examined at bedside.  Patient states that Dr Laurence is  unable to urinate today after removal of Foley catheter.  In-N-Out was tried with 750 mL of urine.  States that she wants to go home but does not want to be on a Foley catheter.  Denies any nausea vomiting or abdominal pain.  Objective: Vitals:   06/13/24 0614 06/13/24 1402  BP: 135/61 (!) 141/77  Pulse: (!) 59 69  Resp: 15 18  Temp: 98 F (36.7 C) 97.8 F (36.6 C)  SpO2: 99% 99%    Intake/Output Summary (Last 24 hours) at 06/13/2024 1628 Last data filed at 06/13/2024 1454 Gross per 24 hour  Intake 840 ml  Output 1650 ml  Net -810 ml   Filed Weights   06/09/24 2326  Weight: 83.5 kg   Body mass index is 34.78 kg/m.   Physical Exam: GENERAL: Patient is alert awake and oriented. Not in obvious distress.  Obese build, elderly female, Communicative, HENT: No scleral pallor or icterus. Pupils equally reactive to light. Oral mucosa is moist NECK: is supple, no gross swelling noted. CHEST: Clear to auscultation. Diminished breath sounds bilaterally. CVS: S1 and S2 heard, no murmur. Regular rate and rhythm.  ABDOMEN: Soft, non-tender, bowel sounds are present.  Suprapubic tenderness noted EXTREMITIES: No edema. CNS: Cranial nerves are intact. No focal motor deficits. SKIN: warm and dry without rashes.  Data Review: I have personally reviewed the following laboratory data and studies,  CBC: Recent Labs  Lab 06/09/24 1825 06/10/24 0422  WBC 11.1* 11.0*  NEUTROABS 8.8*  --   HGB 10.4* 8.7*  HCT 34.8* 28.2*  MCV 80.9 81.3  PLT 239 268   Basic Metabolic Panel: Recent Labs  Lab 06/09/24 1533 06/10/24 0422 06/11/24 0821 06/12/24 0459 06/13/24 0501  NA 137 136 139 138 140  K 4.7 4.3 4.3 4.9 4.3  CL 99 102 104 103 104  CO2 26 25 26 26 28   GLUCOSE 57* 99 161* 129* 140*  BUN 79* 75* 53* 36* 22  CREATININE 3.01* 2.50* 1.82* 1.52* 1.04*  CALCIUM  9.2 8.5* 8.9 8.6* 8.5*  MG 3.6* 3.3* 3.2* 2.6*  --    Liver Function Tests: Recent Labs  Lab 06/09/24 1533  AST 44*   ALT 20  ALKPHOS 94  BILITOT 0.6  PROT 7.1  ALBUMIN 3.4*   No results for input(s): LIPASE, AMYLASE in the last 168 hours. No results for input(s): AMMONIA in the last 168 hours. Cardiac Enzymes: Recent Labs  Lab 06/09/24 1533 06/10/24 0422  CKTOTAL 366* 234   BNP (last 3 results) Recent Labs    07/11/23 1241  BNP 97.7    ProBNP (last 3 results) Recent Labs    03/08/24 1117 06/09/24 1825 06/10/24 0422  PROBNP 76.0 2,292.0* 1,632.0*    CBG: Recent Labs  Lab 06/12/24 1148 06/12/24 1711 06/13/24 0734 06/13/24 1129 06/13/24 1624  GLUCAP 168* 146* 178* 133* 184*   Recent Results (from the past 240 hours)  Urine Culture (for pregnant, neutropenic or urologic patients or patients with an indwelling urinary catheter)  Status: Abnormal   Collection Time: 06/09/24  3:34 PM   Specimen: Urine, Clean Catch  Result Value Ref Range Status   Specimen Description   Final    URINE, CLEAN CATCH Performed at [facility], [address] [place], [place] [address]    Special Requests   Final    NONE Performed at [facility], [address] [place], [place] [address]    Culture >=100,000 COLONIES/mL ESCHERICHIA COLI (A)  Final   Report Status 06/12/2024 FINAL  Final   Organism ID, Bacteria ESCHERICHIA COLI (A)  Final      Susceptibility   Escherichia coli - MIC*    AMPICILLIN >=32 RESISTANT Resistant     CEFAZOLIN  (URINE) Value in next row Sensitive      4 SENSITIVEThis is a modified FDA-approved test that has been validated and its performance characteristics determined by the reporting laboratory.  This laboratory is certified under the Clinical Laboratory Improvement Amendments CLIA as qualified to perform high complexity clinical laboratory testing.    CEFEPIME  Value in next row Sensitive      4 SENSITIVEThis is a modified FDA-approved test that has been validated and its performance characteristics determined by the  reporting laboratory.  This laboratory is certified under the Clinical Laboratory Improvement Amendments CLIA as qualified to perform high complexity clinical laboratory testing.    ERTAPENEM Value in next row Sensitive      4 SENSITIVEThis is a modified FDA-approved test that has been validated and its performance characteristics determined by the reporting laboratory.  This laboratory is certified under the Clinical Laboratory Improvement Amendments CLIA as qualified to perform high complexity clinical laboratory testing.    CEFTRIAXONE  Value in next row Sensitive      4 SENSITIVEThis is a modified FDA-approved test that has been validated and its performance characteristics determined by the reporting laboratory.  This laboratory is certified under the Clinical Laboratory Improvement Amendments CLIA as qualified to perform high complexity clinical laboratory testing.    CIPROFLOXACIN Value in next row Sensitive      4 SENSITIVEThis is a modified FDA-approved test that has been validated and its performance characteristics determined by the reporting laboratory.  This laboratory is certified under the Clinical Laboratory Improvement Amendments CLIA as qualified to perform high complexity clinical laboratory testing.    GENTAMICIN  Value in next row Resistant      4 SENSITIVEThis is a modified FDA-approved test that has been validated and its performance characteristics determined by the reporting laboratory.  This laboratory is certified under the Clinical Laboratory Improvement Amendments CLIA as qualified to perform high complexity clinical laboratory testing.    NITROFURANTOIN Value in next row Sensitive      4 SENSITIVEThis is a modified FDA-approved test that has been validated and its performance characteristics determined by the reporting laboratory.  This laboratory is certified under the Clinical Laboratory Improvement Amendments CLIA as qualified to perform high complexity clinical laboratory  testing.    TRIMETH/SULFA Value in next row Resistant      4 SENSITIVEThis is a modified FDA-approved test that has been validated and its performance characteristics determined by the reporting laboratory.  This laboratory is certified under the Clinical Laboratory Improvement Amendments CLIA as qualified to perform high complexity clinical laboratory testing.    AMPICILLIN/SULBACTAM Value in next row Intermediate      4 SENSITIVEThis is a modified FDA-approved test that has been validated and its performance characteristics determined by the reporting laboratory.  This laboratory is certified under the Clinical Laboratory Improvement Amendments CLIA as qualified to perform high complexity clinical laboratory testing.    PIP/TAZO Value in next row Sensitive      <=4 SENSITIVEThis is a modified FDA-approved test that has been validated and its performance characteristics determined by the reporting laboratory.  This laboratory is certified under the Clinical Laboratory Improvement Amendments CLIA as qualified to perform high complexity clinical laboratory testing.    MEROPENEM Value in next row Sensitive      <=4 SENSITIVEThis is a modified FDA-approved test that has been validated and its performance characteristics determined by the reporting laboratory.  This laboratory is certified under the Clinical Laboratory Improvement Amendments CLIA as qualified to perform high complexity clinical laboratory testing.    * >=100,000 COLONIES/mL ESCHERICHIA COLI     Studies: No results found.    Faatimah Spielberg, MD  [facility] 06/13/2024  If 7PM-7AM, please contact night-coverage

## 2024-06-13 NOTE — Plan of Care (Signed)
  Problem: Clinical Measurements: Goal: Will remain free from infection Outcome: Progressing   Problem: Pain Managment: Goal: General experience of comfort will improve and/or be controlled Outcome: Progressing

## 2024-06-13 NOTE — Inpatient Diabetes Management (Addendum)
 Inpatient Diabetes Program Recommendations  AACE/ADA: New Consensus Statement on Inpatient Glycemic Control (2015)  Target Ranges:  Prepandial:   less than 140 mg/dL      Peak postprandial:   less than 180 mg/dL (1-2 hours)      Critically ill patients:  140 - 180 mg/dL   Lab Results  Component Value Date   GLUCAP 178 (H) 06/13/2024   HGBA1C 6.9 (A) 03/08/2024    Review of Glycemic Control  Diabetes history: DM2 Outpatient Diabetes medications: Farxiga  10 daily Current orders for Inpatient glycemic control: Novolog  0-9 TID  Inpatient Diabetes Program Recommendations:    For home:  D/C Farxiga  given recurrent UTIs  PCP may consider GLP-1 such as Ozempic 0.5 mg weekly, if blood sugars above goal  Watch trends.  Thank you. Shona Brandy, RD, LDN, CDCES Inpatient Diabetes Coordinator [phone]

## 2024-06-14 ENCOUNTER — Encounter: Payer: Self-pay | Admitting: Family Medicine

## 2024-06-14 DIAGNOSIS — N179 Acute kidney failure, unspecified: Secondary | ICD-10-CM | POA: Diagnosis not present

## 2024-06-14 LAB — GLUCOSE, CAPILLARY
Glucose-Capillary: 142 mg/dL — ABNORMAL HIGH (ref 70–99)
Glucose-Capillary: 181 mg/dL — ABNORMAL HIGH (ref 70–99)

## 2024-06-14 MED ORDER — CEFADROXIL 500 MG PO CAPS: 500.0000 mg | ORAL_CAPSULE | Freq: Two times a day (BID) | ORAL | 0 refills | Status: AC

## 2024-06-14 MED ORDER — TAMSULOSIN HCL 0.4 MG PO CAPS: 0.4000 mg | ORAL_CAPSULE | Freq: Every day | ORAL | 0 refills | Status: DC

## 2024-06-14 NOTE — Care Management Important Message (Signed)
 Important Message  Patient Details IM Letter given. Name: Krista Clarke MRN: [id number] Date of Birth: 10/30/1940   Important Message Given:  Yes - Medicare IM     [name] 06/14/2024, 2:16 PM

## 2024-06-14 NOTE — TOC Progression Note (Signed)
 Transition of Care [facility]) - Progression Note    Patient Details  Name: Krista Clarke MRN: [id number] Date of Birth: August 10, 1941  [facility] CM/SW Contact  Toy LITTIE Agar, RN Phone Number:[phone]  06/14/2024, 3:19 PM  Clinical Narrative:    CM received voicemail from daughter [name] in ref to patient home health. CM has attempted to return call which goes straight to voicemail. Message has been left. Per previous IPCM note HH has been set up with [facility]. No other needs noted at this time.      Barriers to Discharge: Barriers Resolved               Expected Discharge Plan and Services         Expected Discharge Date: 06/14/24               DME Arranged: N/A DME Agency: NA       HH Arranged: PT, OT HH Agency: [facility] Date [facility] Agency Contacted: 06/12/24 Time HH Agency Contacted: 1511 Representative spoke with at [facility] Agency: Cindie   Social Drivers of Health (SDOH) Interventions SDOH Screenings   Food Insecurity: No Food Insecurity (06/09/2024)  Housing: Low Risk  (06/09/2024)  Transportation Needs: No Transportation Needs (06/09/2024)  Utilities: Not At Risk (06/09/2024)  Depression (PHQ2-9): Low Risk  (02/24/2024)  Financial Resource Strain: Low Risk  (01/18/2023)  Physical Activity: Inactive (03/06/2021)  Social Connections: Socially Isolated (06/09/2024)  Stress: No Stress Concern Present (03/06/2021)  Tobacco Use: Low Risk  (05/15/2024)    Readmission Risk Interventions    06/12/2024    3:09 PM 02/21/2024   11:40 AM  Readmission Risk Prevention Plan  Transportation Screening Complete Complete  PCP or Specialist Appt within 5-7 Days  Complete  PCP or Specialist Appt within 3-5 Days Complete   Home Care Screening  Complete  Medication Review (RN CM)  Complete  Social Work Consult for Recovery Care Planning/Counseling Complete   Palliative Care Screening Not Applicable   Medication Review Oceanographer) Complete

## 2024-06-14 NOTE — Plan of Care (Signed)
  Problem: Clinical Measurements: Goal: Ability to maintain clinical measurements within normal limits will improve Outcome: Progressing Goal: Diagnostic test results will improve Outcome: Progressing   Problem: Elimination: Goal: Will not experience complications related to urinary retention Outcome: Progressing   Problem: Safety: Goal: Ability to remain free from injury will improve Outcome: Progressing

## 2024-06-14 NOTE — Progress Notes (Signed)
 Discharge instructions discussed with patient and family, verbalized agreement and understanding

## 2024-06-14 NOTE — Discharge Summary (Signed)
 Physician Discharge Summary  Krista Clarke FMW:[id number] DOB: 1941-02-01 DOA: 06/09/2024  PCP: Jodie Lavern LITTIE, MD  Admit date: 06/09/2024 Discharge date: 06/14/2024  Admitted From: Home  Discharge disposition: Home with home health   Recommendations for Outpatient Follow-Up:   Follow up with your primary care provider in one week.  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  Diet recommendation: Low sodium, heart healthy.    Wound care: None.  Code status: Full.   History of Present Illness:   Patient is a 83 year old female with PMH of Alzheimer's dementia, diabetes, pacemaker placement, presented to the hospital with  generalized weakness, fall.  Patient was initially admitted hospital for  UTI, AKI, acute urinary retention, bilateral hydronephrosis and hydroureter.  Foley catheter placed in the emergency department for acute urinary retention.  AKI improved with IV fluids.  PT saw the patient during hospitalization and recommended skilled nursing facility placement but patient and family has declined.   Hospital Course:   Following conditions were addressed during hospitalization as listed below,  AKI superimposed on CKD stage IIIa Acute urinary retention Bilateral hydronephrosis, hydroureter E coli UTI  Initially Foley catheter was placed and with return  of pus.  At this time has received antibiotics with improvement in her symptoms.  Renal function has improved.  Losartan  and Lasix  was kept on hold including Farxiga .  Voiding trial was done during hospitalization but the patient failed and had to be re placed with Foley again.  Initiated Flomax during hospitalization which will be continued on discharge.  Communicated with urology who stated that patient will follow-up with [facility] as outpatient for addressing Foley catheter.  Patient might need urodynamic study.  Patient will continue Keflex  for 3 more days to complete the course of antibiotic.  Acute CHF ruled out Chronic diastolic CHF PMH complete heart block, status post permanent pacemaker  2D echocardiogram from 2016 showed grade 2 diastolic dysfunction.  Lasix  will be initiated after discharge.  Creatinine prior to discharge was 1.0    Diabetes mellitus type 2 not on insulin  Hypoglycemia possibly secondary to poor oral intake. Review of recent hemoglobin A1c of 6.9 in July 2025.  Hold Farxiga  for now due to recurrent UTI.  Received sliding scale insulin  while in the hospital.  Was on glipizide  at home will consider discontinuation and watching due to high risk for hypoglycemia.    Elevated troponin Flat troponins without chest pain and EKG changes.  Likely secondary to AKI.     Alzheimer's dementia Stable.  Delirium precautions.  Patient wishes to live by herself at home..  Patient does have a daughter who assist her at home.   Chronic pain syndrome Continue Lortab.   Class 1 obesity Body mass index is 34.78 kg/m.  Patient might benefit from lifestyle modification.  Debility weakness deconditioning.  Patient was seen by physical therapy during hospitalization and recommended  skilled nursing facility but despite multiple conversation with the patient and the family, at this time she does not wish to go to rehabilitation facility.  She wishes to go home with home health.  She  understands the need for follow-up with urology as outpatient for Foley catheter and urinary retention.   Disposition.  At this time, patient is stable for disposition home with outpatient PCP and urology follow-up  Medical Consultants:   None.  Procedures:    Foley catheter placement x 2 Subjective:   Today, patient was seen and examined at bedside.  Denies interval complaints and wishes to go home.  Discharge Exam:   Vitals:   06/14/24 0558 06/14/24 1343  BP: (!) 147/64 (!) 142/63  Pulse: 76 75  Resp: 17 16  Temp: (!) 97.5 F (36.4 C) 98.7 F (37.1 C)  SpO2: 99% 99%   Vitals:   06/13/24 1402 06/13/24 2226 06/14/24 0558 06/14/24 1343  BP: (!) 141/77 (!) 140/64 (!) 147/64 (!) 142/63  Pulse: 69 67 76 75  Resp: 18 17 17 16   Temp: 97.8 F (36.6 C) 98.5 F (36.9 C) (!) 97.5 F (36.4 C) 98.7 F (37.1 C)  TempSrc: Oral  Oral Oral  SpO2: 99% 100% 99% 99%  Weight:      Height:        General: Alert awake, not in obvious distress elderly female, appears weak and deconditioned. HENT: pupils equally reacting to light,  No scleral pallor or icterus noted. Oral mucosa is moist.  Chest:  Clear breath sounds.   CVS: S1 &S2 heard. No murmur.  Regular rate and rhythm. Abdomen: Soft, nontender, nondistended.  Bowel sounds are heard.  Foley catheter in place. Extremities: No cyanosis, clubbing or edema.  Peripheral pulses are palpable. Psych: Alert, awake and oriented, normal mood CNS:  No cranial nerve deficits.  Moves all extremities. Skin: Warm and dry.  No rashes noted.  The results of significant diagnostics from this hospitalization (including imaging, microbiology, ancillary and laboratory) are listed below for reference.     Diagnostic Studies:   ECHOCARDIOGRAM COMPLETE Result Date: 06/10/2024    ECHOCARDIOGRAM REPORT   Patient Name:   Krista Clarke Date of Exam: 06/10/2024 Medical Rec #:  [id number]   Height:       61.0 in Accession #:    [id number]  Weight:        184.1 lb Date of Birth:  1940-10-30    BSA:          1.823 m Patient Age:    83 years    BP:           124/61 mmHg Patient Gender: F           HR:           72 bpm. Exam Location:  Inpatient Procedure: 2D Echo, Color Doppler, Cardiac Doppler and Intracardiac            Opacification Agent (Both Spectral and Color Flow Doppler were            utilized during procedure). Indications:    CHF-Acute Diastolic  History:        Patient has prior history of Echocardiogram examinations, most                 recent 12/11/2014. Pacemaker, Arrythmias:Heart Block; Risk                 Factors:Hypertension, Diabetes, Dyslipidemia and Non-Smoker.  Anemia.  Sonographer:    Logan Shove RDCS Referring Phys: [id number] VASUNDHRA RATHORE IMPRESSIONS  1. Left ventricular ejection fraction, by estimation, is 60 to 65%. The left ventricle has normal function. The left ventricle has no regional wall motion abnormalities. Left ventricular diastolic parameters are consistent with Grade I diastolic dysfunction (impaired relaxation).  2. Right ventricular systolic function is normal. The right ventricular size is normal. There is normal pulmonary artery systolic pressure.  3. Left atrial size was mildly dilated.  4. The mitral valve is normal in structure. No evidence of mitral valve regurgitation. No evidence of mitral stenosis. Moderate mitral annular calcification.  5. The aortic valve is tricuspid. Aortic valve regurgitation is not visualized. No aortic stenosis is present.  6. The inferior vena cava is normal in size with greater than 50% respiratory variability, suggesting right atrial pressure of 3 mmHg. Comparison(s): Prior images unable to be directly viewed, comparison made by report only. FINDINGS  Left Ventricle: Left ventricular ejection fraction, by estimation, is 60 to 65%. The left ventricle has normal function. The left ventricle has no regional wall motion abnormalities. Definity contrast agent was given IV to  delineate the left ventricular  endocardial borders. The left ventricular internal cavity size was normal in size. Suboptimal image quality limits for assessment of left ventricular hypertrophy. Abnormal (paradoxical) septal motion, consistent with RV pacemaker. Left ventricular diastolic parameters are consistent with Grade I diastolic dysfunction (impaired relaxation). Indeterminate filling pressures. Right Ventricle: The right ventricular size is normal. No increase in right ventricular wall thickness. Right ventricular systolic function is normal. There is normal pulmonary artery systolic pressure. The tricuspid regurgitant velocity is 2.67 m/s, and  with an assumed right atrial pressure of 3 mmHg, the estimated right ventricular systolic pressure is 31.5 mmHg. Left Atrium: Left atrial size was mildly dilated. Right Atrium: Right atrial size was normal in size. Pericardium: There is no evidence of pericardial effusion. Mitral Valve: The mitral valve is normal in structure. Moderate mitral annular calcification. No evidence of mitral valve regurgitation. No evidence of mitral valve stenosis. Tricuspid Valve: The tricuspid valve is grossly normal. Tricuspid valve regurgitation is trivial. Aortic Valve: The aortic valve is tricuspid. Aortic valve regurgitation is not visualized. No aortic stenosis is present. Aortic valve mean gradient measures 5.2 mmHg. Aortic valve peak gradient measures 8.8 mmHg. Aortic valve area, by VTI measures 2.39 cm. Pulmonic Valve: The pulmonic valve was not well visualized. Pulmonic valve regurgitation is not visualized. No evidence of pulmonic stenosis. Aorta: The aortic root was not well visualized. Venous: The inferior vena cava is normal in size with greater than 50% respiratory variability, suggesting right atrial pressure of 3 mmHg. IAS/Shunts: There is redundancy of the interatrial septum. No atrial level shunt detected by color flow Doppler. Additional Comments: A device lead is  visualized.  LEFT VENTRICLE PLAX 2D LVIDd:         3.70 cm      Diastology LVIDs:         2.60 cm      LV e' medial:    5.55 cm/s LV PW:         1.10 cm      LV E/e' medial:  13.6 LV IVS:        1.00 cm      LV e' lateral:   6.42 cm/s LVOT diam:     2.00 cm      LV E/e' lateral: 11.8 LV SV:  71 LV SV Index:   39 LVOT Area:     3.14 cm  LV Volumes (MOD) LV vol d, MOD A2C: 133.0 ml LV vol d, MOD A4C: 107.0 ml LV vol s, MOD A2C: 54.2 ml LV vol s, MOD A4C: 44.0 ml LV SV MOD A2C:     78.8 ml LV SV MOD A4C:     107.0 ml LV SV MOD BP:      77.1 ml RIGHT VENTRICLE            IVC RV S prime:     9.57 cm/s  IVC diam: 1.80 cm TAPSE (M-mode): 0.7 cm LEFT ATRIUM             Index LA diam:        3.60 cm 1.97 cm/m LA Vol (A2C):   72.5 ml 39.76 ml/m LA Vol (A4C):   51.1 ml 28.03 ml/m LA Biplane Vol: 61.1 ml 33.51 ml/m  AORTIC VALVE AV Area (Vmax):    2.38 cm AV Area (Vmean):   2.51 cm AV Area (VTI):     2.39 cm AV Vmax:           148.56 cm/s AV Vmean:          108.664 cm/s AV VTI:            0.298 m AV Peak Grad:      8.8 mmHg AV Mean Grad:      5.2 mmHg LVOT Vmax:         112.60 cm/s LVOT Vmean:        86.842 cm/s LVOT VTI:          0.227 m LVOT/AV VTI ratio: 0.76  AORTA Ao Root diam: 2.30 cm MITRAL VALVE               TRICUSPID VALVE MV Area (PHT): 3.83 cm    TR Peak grad:   28.5 mmHg MV Decel Time: 198 msec    TR Vmax:        267.00 cm/s MV E velocity: 75.60 cm/s MV A velocity: 97.50 cm/s  SHUNTS MV E/A ratio:  0.78        Systemic VTI:  0.23 m                            Systemic Diam: 2.00 cm Jerel Croitoru MD Electronically signed by Jerel Balding MD Signature Date/Time: 06/10/2024/1:23:23 PM    Final    CT CHEST ABDOMEN PELVIS WO CONTRAST Result Date: 06/09/2024 CLINICAL DATA:  Recent fall with increased lethargy, initial encounter EXAM: CT CHEST, ABDOMEN AND PELVIS WITHOUT CONTRAST TECHNIQUE: Multidetector CT imaging of the chest, abdomen and pelvis was performed following the standard protocol without  IV contrast. RADIATION DOSE REDUCTION: This exam was performed according to the departmental dose-optimization program which includes automated exposure control, adjustment of the mA and/or kV according to patient size and/or use of iterative reconstruction technique. COMPARISON:  None Available. FINDINGS: CT CHEST FINDINGS Cardiovascular: Atherosclerotic calcifications of the thoracic aorta are noted. Pacing device is seen. No cardiac enlargement is noted. Pulmonary artery appears within normal limits. Mediastinum/Nodes: Thoracic inlet is within normal limits. No hilar or mediastinal adenopathy is noted. The esophagus as visualized is within normal limits. Lungs/Pleura: Small right-sided pleural effusion is noted. Right basilar atelectasis is noted. No sizable parenchymal nodule is seen. Musculoskeletal: Degenerative changes of the thoracic spine are noted. No acute rib abnormality is seen. CT  ABDOMEN PELVIS FINDINGS Hepatobiliary: Liver is within normal limits. Gallbladder is well distended with dependent density likely related to small gallstones. No biliary ductal dilatation is seen. Pancreas: Unremarkable. No pancreatic ductal dilatation or surrounding inflammatory changes. Spleen: Normal in size without focal abnormality. Adrenals/Urinary Tract: Adrenal glands are within normal limits. Kidneys are well visualized bilaterally with evidence of hydronephrosis and hydroureter. This extends to the level of the urinary bladder. No definitive stones are seen. This is likely related to over distended bladder. Stomach/Bowel: No obstructive or inflammatory changes of colon are noted. The appendix is not well visualized. No inflammatory changes to suggest appendicitis are noted. Small bowel and stomach are within normal limits. Vascular/Lymphatic: Aortic atherosclerosis. No enlarged abdominal or pelvic lymph nodes. Reproductive: Status post hysterectomy. No adnexal masses. Other: No abdominal wall hernia or abnormality.  No abdominopelvic ascites. Musculoskeletal: Degenerative changes of lumbar spine. IMPRESSION: CT of the chest: Small right effusion and right basilar atelectasis. CT of the abdomen and pelvis: Dependent density within the gallbladder likely representing small gallstones or gallbladder sludge. Bilateral hydronephrosis and hydroureter secondary to over distended bladder. No other focal abnormality is noted. Electronically Signed   By: Oneil Devonshire M.D.   On: 06/09/2024 20:44   CT L-SPINE NO CHARGE Result Date: 06/09/2024 CLINICAL DATA:  Fall 3 days ago EXAM: CT Thoracic and Lumbar spine without contrast TECHNIQUE: Multiplanar CT images of the thoracic and lumbar spine were reconstructed from contemporary CT of the Chest, Abdomen, and Pelvis. RADIATION DOSE REDUCTION: This exam was performed according to the departmental dose-optimization program which includes automated exposure control, adjustment of the mA and/or kV according to patient size and/or use of iterative reconstruction technique. CONTRAST:  None COMPARISON:  None Available. FINDINGS: CT THORACIC SPINE FINDINGS Alignment: Within normal limits although increased kyphosis is noted in the upper thoracic spine. Vertebrae: 12 thoracic vertebra are noted. Mild osteophytic changes are seen. No acute fracture is seen. No paraspinal mass is noted. The visualize ribcage is within normal limits. Paraspinal and other soft tissues: Paraspinal soft tissues are within normal limits. Small right-sided pleural effusion is noted with basilar atelectasis. Disc levels: No acute disc pathology is noted. CT LUMBAR SPINE FINDINGS Segmentation: 5 lumbar type vertebral bodies are well visualized. Alignment: Alignment is within normal limits with the exception of mild degenerative anterolisthesis of L5 on S1. No pars defects are noted. Vertebrae: Vertebral body height is well maintained. Multilevel facet hypertrophic changes and osteophytic changes are noted. No acute fracture  or acute facet abnormality is noted. Paraspinal and other soft tissues: Paraspinal soft tissue structures show the bladder to be well distended. No focal mass lesion is noted. Disc levels: No specific disc pathology is noted. Central canal stenosis is noted at L4-5 secondary to facet hypertrophic changes as well as a generalized disc bulge. Similar findings are noted at L5-S1 accentuated by the degenerative anterolisthesis. IMPRESSION: CT of the thoracic spine: Degenerative change without acute abnormality. CT of the lumbar spine: Multilevel degenerative change without acute bony abnormality. Central canal stenosis is noted at L4-5 and L5-S1 as described. Electronically Signed   By: Oneil Devonshire M.D.   On: 06/09/2024 20:38   CT T-SPINE NO CHARGE Result Date: 06/09/2024 CLINICAL DATA:  Fall 3 days ago EXAM: CT Thoracic and Lumbar spine without contrast TECHNIQUE: Multiplanar CT images of the thoracic and lumbar spine were reconstructed from contemporary CT of the Chest, Abdomen, and Pelvis. RADIATION DOSE REDUCTION: This exam was performed according to the departmental dose-optimization program which  includes automated exposure control, adjustment of the mA and/or kV according to patient size and/or use of iterative reconstruction technique. CONTRAST:  None COMPARISON:  None Available. FINDINGS: CT THORACIC SPINE FINDINGS Alignment: Within normal limits although increased kyphosis is noted in the upper thoracic spine. Vertebrae: 12 thoracic vertebra are noted. Mild osteophytic changes are seen. No acute fracture is seen. No paraspinal mass is noted. The visualize ribcage is within normal limits. Paraspinal and other soft tissues: Paraspinal soft tissues are within normal limits. Small right-sided pleural effusion is noted with basilar atelectasis. Disc levels: No acute disc pathology is noted. CT LUMBAR SPINE FINDINGS Segmentation: 5 lumbar type vertebral bodies are well visualized. Alignment: Alignment is within  normal limits with the exception of mild degenerative anterolisthesis of L5 on S1. No pars defects are noted. Vertebrae: Vertebral body height is well maintained. Multilevel facet hypertrophic changes and osteophytic changes are noted. No acute fracture or acute facet abnormality is noted. Paraspinal and other soft tissues: Paraspinal soft tissue structures show the bladder to be well distended. No focal mass lesion is noted. Disc levels: No specific disc pathology is noted. Central canal stenosis is noted at L4-5 secondary to facet hypertrophic changes as well as a generalized disc bulge. Similar findings are noted at L5-S1 accentuated by the degenerative anterolisthesis. IMPRESSION: CT of the thoracic spine: Degenerative change without acute abnormality. CT of the lumbar spine: Multilevel degenerative change without acute bony abnormality. Central canal stenosis is noted at L4-5 and L5-S1 as described. Electronically Signed   By: Oneil Devonshire M.D.   On: 06/09/2024 20:38   CT Cervical Spine Wo Contrast Result Date: 06/09/2024 CLINICAL DATA:  Neck trauma (Age >= 65y).  Fall 3 days ago. EXAM: CT CERVICAL SPINE WITHOUT CONTRAST TECHNIQUE: Multidetector CT imaging of the cervical spine was performed without intravenous contrast. Multiplanar CT image reconstructions were also generated. RADIATION DOSE REDUCTION: This exam was performed according to the departmental dose-optimization program which includes automated exposure control, adjustment of the mA and/or kV according to patient size and/or use of iterative reconstruction technique. COMPARISON:  None Available. FINDINGS: Alignment: Normal Skull base and vertebrae: Diffuse osteopenia.  No fracture. Soft tissues and spinal canal: No prevertebral fluid or swelling. No visible canal hematoma. Disc levels: Moderate bilateral degenerative facet disease, right greater than left. Mild diffuse degenerative disc disease. Upper chest: No acute findings. Other: None  IMPRESSION: Multilevel degenerative disc and facet disease. No acute bony abnormality. Electronically Signed   By: Franky Crease M.D.   On: 06/09/2024 20:33   CT Head Wo Contrast Result Date: 06/09/2024 CLINICAL DATA:  Head trauma, minor (Age >= 65y).  Fall 3 days ago. EXAM: CT HEAD WITHOUT CONTRAST TECHNIQUE: Contiguous axial images were obtained from the base of the skull through the vertex without intravenous contrast. RADIATION DOSE REDUCTION: This exam was performed according to the departmental dose-optimization program which includes automated exposure control, adjustment of the mA and/or kV according to patient size and/or use of iterative reconstruction technique. COMPARISON:  02/19/2024 FINDINGS: Brain: Diffuse age related atrophy. No acute intracranial abnormality. Specifically, no hemorrhage, hydrocephalus, mass lesion, acute infarction, or significant intracranial injury. Vascular: No hyperdense vessel or unexpected calcification. Skull: No acute calvarial abnormality. Sinuses/Orbits: No acute findings Other: None IMPRESSION: No acute intracranial abnormality. Electronically Signed   By: Franky Crease M.D.   On: 06/09/2024 20:31   DG Chest Port 1 View Result Date: 06/09/2024 CLINICAL DATA:  Status post fall. EXAM: PORTABLE CHEST 1 VIEW COMPARISON:  July 11, 2023 FINDINGS: There is stable dual lead AICD positioning. The heart size and mediastinal contours are within normal limits. Low lung volumes are noted. No acute infiltrate, pleural effusion or pneumothorax is identified. Multilevel degenerative changes are seen throughout the thoracic spine. IMPRESSION: No active disease. Electronically Signed   By: Suzen Dials M.D.   On: 06/09/2024 16:57   DG Pelvis Portable Result Date: 06/09/2024 CLINICAL DATA:  Status post fall. EXAM: PORTABLE PELVIS 1-2 VIEWS COMPARISON:  None Available. FINDINGS: There is no evidence of pelvic fracture or diastasis. No pelvic bone lesions are seen. Moderate  severity degenerative changes are seen involving both hips in the form of joint space narrowing and acetabular sclerosis. Additional marked severity degenerative changes are noted within the lower lumbar spine. IMPRESSION: 1. No acute fracture or dislocation. 2. Moderate severity degenerative changes involving both hips. Electronically Signed   By: Suzen Dials M.D.   On: 06/09/2024 16:56     Labs:   Basic Metabolic Panel: Recent Labs  Lab 06/09/24 1533 06/10/24 0422 06/11/24 0821 06/12/24 0459 06/13/24 0501  NA 137 136 139 138 140  K 4.7 4.3 4.3 4.9 4.3  CL 99 102 104 103 104  CO2 26 25 26 26 28   GLUCOSE 57* 99 161* 129* 140*  BUN 79* 75* 53* 36* 22  CREATININE 3.01* 2.50* 1.82* 1.52* 1.04*  CALCIUM  9.2 8.5* 8.9 8.6* 8.5*  MG 3.6* 3.3* 3.2* 2.6*  --    GFR Estimated Creatinine Clearance: 39.5 mL/min (A) (by C-G formula based on SCr of 1.04 mg/dL (H)). Liver Function Tests: Recent Labs  Lab 06/09/24 1533  AST 44*  ALT 20  ALKPHOS 94  BILITOT 0.6  PROT 7.1  ALBUMIN 3.4*   No results for input(s): LIPASE, AMYLASE in the last 168 hours. No results for input(s): AMMONIA in the last 168 hours. Coagulation profile Recent Labs  Lab 06/09/24 1533  INR 1.0    CBC: Recent Labs  Lab 06/09/24 1825 06/10/24 0422  WBC 11.1* 11.0*  NEUTROABS 8.8*  --   HGB 10.4* 8.7*  HCT 34.8* 28.2*  MCV 80.9 81.3  PLT 239 268   Cardiac Enzymes: Recent Labs  Lab 06/09/24 1533 06/10/24 0422  CKTOTAL 366* 234   BNP: Invalid input(s): POCBNP CBG: Recent Labs  Lab 06/13/24 1129 06/13/24 1624 06/13/24 1941 06/14/24 0736 06/14/24 1149  GLUCAP 133* 184* 168* 142* 181*   D-Dimer No results for input(s): DDIMER in the last 72 hours. Hgb A1c No results for input(s): HGBA1C in the last 72 hours. Lipid Profile No results for input(s): CHOL, HDL, LDLCALC, TRIG, CHOLHDL, LDLDIRECT in the last 72 hours. Thyroid  function studies No results for  input(s): TSH, T4TOTAL, T3FREE, THYROIDAB in the last 72 hours.  Invalid input(s): FREET3 Anemia work up No results for input(s): VITAMINB12, FOLATE, FERRITIN, TIBC, IRON, RETICCTPCT in the last 72 hours. Microbiology Recent Results (from the past 240 hours)  Urine Culture (for pregnant, neutropenic or urologic patients or patients with an indwelling urinary catheter)     Status: Abnormal   Collection Time: 06/09/24  3:34 PM   Specimen: Urine, Clean Catch  Result Value Ref Range Status   Specimen Description   Final    URINE, CLEAN CATCH Performed at [facility], [address], [place], [place] [address]    Special Requests   Final    NONE Performed at [facility], [address] [place], [place] [address]    Culture >=100,000 COLONIES/mL  ESCHERICHIA COLI (A)  Final   Report Status 06/12/2024 FINAL  Final   Organism ID, Bacteria ESCHERICHIA COLI (A)  Final      Susceptibility   Escherichia coli - MIC*    AMPICILLIN >=32 RESISTANT Resistant     CEFAZOLIN  (URINE) Value in next row Sensitive      4 SENSITIVEThis is a modified FDA-approved test that has been validated and its performance characteristics determined by the reporting laboratory.  This laboratory is certified under the Clinical Laboratory Improvement Amendments CLIA as qualified to perform high complexity clinical laboratory testing.    CEFEPIME  Value in next row Sensitive      4 SENSITIVEThis is a modified FDA-approved test that has been validated and its performance characteristics determined by the reporting laboratory.  This laboratory is certified under the Clinical Laboratory Improvement Amendments CLIA as qualified to perform high complexity clinical laboratory testing.    ERTAPENEM Value in next row Sensitive      4 SENSITIVEThis is a modified FDA-approved test that has been validated and its performance characteristics determined by the reporting  laboratory.  This laboratory is certified under the Clinical Laboratory Improvement Amendments CLIA as qualified to perform high complexity clinical laboratory testing.    CEFTRIAXONE  Value in next row Sensitive      4 SENSITIVEThis is a modified FDA-approved test that has been validated and its performance characteristics determined by the reporting laboratory.  This laboratory is certified under the Clinical Laboratory Improvement Amendments CLIA as qualified to perform high complexity clinical laboratory testing.    CIPROFLOXACIN Value in next row Sensitive      4 SENSITIVEThis is a modified FDA-approved test that has been validated and its performance characteristics determined by the reporting laboratory.  This laboratory is certified under the Clinical Laboratory Improvement Amendments CLIA as qualified to perform high complexity clinical laboratory testing.    GENTAMICIN  Value in next row Resistant      4 SENSITIVEThis is a modified FDA-approved test that has been validated and its performance characteristics determined by the reporting laboratory.  This laboratory is certified under the Clinical Laboratory Improvement Amendments CLIA as qualified to perform high complexity clinical laboratory testing.    NITROFURANTOIN Value in next row Sensitive      4 SENSITIVEThis is a modified FDA-approved test that has been validated and its performance characteristics determined by the reporting laboratory.  This laboratory is certified under the Clinical Laboratory Improvement Amendments CLIA as qualified to perform high complexity clinical laboratory testing.    TRIMETH/SULFA Value in next row Resistant      4 SENSITIVEThis is a modified FDA-approved test that has been validated and its performance characteristics determined by the reporting laboratory.  This laboratory is certified under the Clinical Laboratory Improvement Amendments CLIA as qualified to perform high complexity clinical laboratory testing.     AMPICILLIN/SULBACTAM Value in next row Intermediate      4 SENSITIVEThis is a modified FDA-approved test that has been validated and its performance characteristics determined by the reporting laboratory.  This laboratory is certified under the Clinical Laboratory Improvement Amendments CLIA as qualified to perform high complexity clinical laboratory testing.    PIP/TAZO Value in next row Sensitive      <=4 SENSITIVEThis is a modified FDA-approved test that has been validated and its performance characteristics determined by the reporting laboratory.  This laboratory is certified under the Clinical Laboratory Improvement Amendments CLIA as qualified to perform high complexity clinical laboratory testing.    MEROPENEM  Value in next row Sensitive      <=4 SENSITIVEThis is a modified FDA-approved test that has been validated and its performance characteristics determined by the reporting laboratory.  This laboratory is certified under the Clinical Laboratory Improvement Amendments CLIA as qualified to perform high complexity clinical laboratory testing.    * >=100,000 COLONIES/mL ESCHERICHIA COLI     Discharge Instructions:   Discharge Instructions     Call MD for:  persistant nausea and vomiting   Complete by: As directed    Call MD for:  severe uncontrolled pain   Complete by: As directed    Call MD for:  temperature >100.4   Complete by: As directed    Diet - low sodium heart healthy   Complete by: As directed    Discharge instructions   Complete by: As directed    Follow-up with your primary care provider in 1 week.  Check blood work at that time.  Complete course of antibiotic.  Seek medical attention for worsening symptoms.  Continue Foley catheter.  Follow-up with urology as outpatient to address Foley catheter.   Increase activity slowly   Complete by: As directed    No wound care   Complete by: As directed       Allergies as of 06/14/2024       Reactions   Prednisone  Other (See Comments), Palpitations, Rash   Doesn't work well for patient        Medication List     PAUSE taking these medications    dapagliflozin  propanediol 10 MG Tabs tablet Wait to take this until: June 18, 2024 Morning Commonly known as: Farxiga  Take 1 tablet (10 mg total) by mouth daily. What changed: when to take this   furosemide  40 MG tablet Wait to take this until: June 15, 2024 Commonly known as: LASIX  Take 1 tablet (40 mg total) by mouth daily. What changed: when to take this   losartan  100 MG tablet Wait to take this until: June 15, 2024 Commonly known as: COZAAR  TAKE 1 TABLET BY MOUTH EVERY DAY What changed: when to take this       STOP taking these medications    magnesium  oxide 400 (240 Mg) MG tablet Commonly known as: MAG-OX       TAKE these medications    Accu-Chek Guide Me w/Device Kit USE AS DIRECTED   Accu-Chek Guide Test test strip Generic drug: glucose blood Use as instructed   Accu-Chek Softclix Lancets lancets Use as instructed   allopurinol  100 MG tablet Commonly known as: ZYLOPRIM  TAKE 1 TABLET BY MOUTH EVERY MORNING   amLODipine  5 MG tablet Commonly known as: NORVASC  TAKE 1 TABLET BY MOUTH EVERY DAY What changed: when to take this   ascorbic acid  500 MG tablet Commonly known as: VITAMIN C  TAKE 1 TABLET BY MOUTH EVERY DAY What changed: when to take this   Aspirin  Low Dose 81 MG chewable tablet Generic drug: aspirin  CHEW ONE TABLET IN MOUTH EVERY DAY What changed: See the new instructions.   atorvastatin  40 MG tablet Commonly known as: LIPITOR TAKE 1 TABLET BY MOUTH AT BEDTIME   cefadroxil 500 MG capsule Commonly known as: DURICEF Take 1 capsule (500 mg total) by mouth 2 (two) times daily for 3 days.   cyanocobalamin  1000 MCG tablet Commonly known as: VITAMIN B12 TAKE 1 TABLET BY MOUTH EVERY DAY What changed: when to take this   diclofenac  Sodium 1 % Gel Commonly known as: VOLTAREN  Apply 4 GRAMS  topically 4 TIMES daily AS NEEDED FOR PAIN   FeroSul 325 (65 Fe) MG tablet Generic drug: ferrous sulfate  TAKE 1 TABLET BY MOUTH 2 TIMES DAILY   HYDROcodone -acetaminophen  5-325 MG tablet Commonly known as: NORCO/VICODIN TAKE 1 TABLET BY MOUTH 2 TIMES DAILY   Lutein  20 MG Caps TAKE 1 CAPSULE BY MOUTH EVERY DAY What changed: when to take this   meclizine  12.5 MG tablet Commonly known as: ANTIVERT  Take 1 tablet (12.5 mg total) by mouth 2 (two) times daily as needed for dizziness.   metoprolol  succinate 25 MG 24 hr tablet Commonly known as: TOPROL -XL TAKE 1 TABLET BY MOUTH EVERY DAY What changed: when to take this   omeprazole  20 MG capsule Commonly known as: PRILOSEC TAKE 1 CAPSULE BY MOUTH EVERY DAY BEFORE BREAKFAST   tamsulosin 0.4 MG Caps capsule Commonly known as: FLOMAX Take 1 capsule (0.4 mg total) by mouth daily after supper.   triamcinolone  cream 0.1 % Commonly known as: KENALOG  Apply 1 application topically 2 (two) times daily. For 2 weeks, then as needed        Follow-up Information     [name], MD Follow up in 1 week(s).   Specialty: Family Medicine Contact information: [address] [place] [address] [phone]         [facility]. Schedule an appointment as soon as possible for a visit in 1 week(s).   Why: for voiding trial foley catheter for retention Contact information: [address] [place]  [address] [phone]                 Time coordinating discharge: 39 minutes  Signed:  Alzena Gerber  [facility] 06/14/2024, 2:52 PM

## 2024-06-18 ENCOUNTER — Other Ambulatory Visit: Payer: Self-pay

## 2024-06-18 ENCOUNTER — Telehealth: Payer: Self-pay

## 2024-06-18 MED ORDER — DICLOFENAC SODIUM 1 % EX GEL: 4.0000 g | Freq: Two times a day (BID) | CUTANEOUS | 3 refills | Status: DC

## 2024-06-18 MED ORDER — TRIAMCINOLONE ACETONIDE 0.1 % EX CREA: 1.0000 | TOPICAL_CREAM | Freq: Two times a day (BID) | CUTANEOUS | 0 refills | Status: DC

## 2024-06-18 MED ORDER — MECLIZINE HCL 12.5 MG PO TABS: 12.5000 mg | ORAL_TABLET | Freq: Two times a day (BID) | ORAL | 2 refills | Status: DC | PRN

## 2024-06-18 NOTE — Telephone Encounter (Signed)
 Copied from CRM #[id number]. Topic: Clinical - Home Health Verbal Orders >> Jun 18, 2024  3:12 PM Ashley R wrote: Caller/Agency: [facility] health Callback Number: [phone] (confidential VM) Service Requested: Physical Therapy Frequency: 1x week for 4 weeks, also recommend home health nurse, medical social worker.   Any new concerns about the patient? Yes Unstageable wound on right heel, stage 2 sacral area, chronic pain 6/10, BP normal seated unable to get standing reading, light headed when standing. Foley catheter for urination, diagnosis CHF, does not weigh in home, just discharged from UTI concern, family question about farxiga  - believe to be related to admission.   Medications she does not have in home: Kenalog  Meclizine  Voltarane Gel Farxiga  (supposed to start today)  Recommended:  Hospital bed Transport wheel chair  I sent in the first 3 medications. Please advise on the farxiga  from the information in the notes and the last 2

## 2024-06-18 NOTE — Transitions of Care (Post Inpatient/ED Visit) (Signed)
   06/18/2024  Name: Krista Clarke MRN: [id number] DOB: 07-13-1941  Today's TOC FU Call Status: Today's TOC FU Call Status:: Unsuccessful Call (1st Attempt) Unsuccessful Call (1st Attempt) Date: 06/18/24  Attempted to reach the patient regarding the most recent Inpatient/ED visit.  Follow Up Plan: Additional outreach attempts will be made to reach the patient to complete the Transitions of Care (Post Inpatient/ED visit) call.   Alan Ee, RN, BSN, CEN [employer]- Transition of Care Team.  [facility] [phone]

## 2024-06-19 ENCOUNTER — Telehealth: Payer: Self-pay

## 2024-06-19 NOTE — Transitions of Care (Post Inpatient/ED Visit) (Signed)
   06/19/2024  Name: Krista Clarke MRN: [id number] DOB: [birth year]  Today's TOC FU Call Status: Today's TOC FU Call Status:: Unsuccessful Call (2nd Attempt) Unsuccessful Call (2nd Attempt) Date: 06/19/24  Attempted to reach the patient regarding the most recent Inpatient/ED visit.  Follow Up Plan: Additional outreach attempts will be made to reach the patient to complete the Transitions of Care (Post Inpatient/ED visit) call.   Alan Ee, RN, BSN, CEN [employer]- Transition of Care Team.  [facility] [phone]

## 2024-06-21 ENCOUNTER — Telehealth: Payer: Self-pay

## 2024-06-21 NOTE — Telephone Encounter (Signed)
 Copied from CRM [id number]. Topic: Clinical - Home Health Verbal Orders >> Jun 21, 2024 11:19 AM Laymon HERO wrote: Caller/Agency: [facility] [place] Callback Number: [phone] Service Requested: Skilled Nursing Frequency: 1x 9  Any new concerns about the patient? No  Gave ok to move forward with ordered the are requesting

## 2024-06-21 NOTE — Transitions of Care (Post Inpatient/ED Visit) (Signed)
   06/21/2024  Name: Krista Clarke MRN: [id number] DOB: [birth year]  Today's TOC FU Call Status: Today's TOC FU Call Status:: Unsuccessful Call (3rd Attempt) Unsuccessful Call (3rd Attempt) Date: 06/21/24  Attempted to reach the patient regarding the most recent Inpatient/ED visit.  Follow Up Plan: No further outreach attempts will be made at this time. We have been unable to contact the patient.  Arvin Seip RN, BSN, CCM [employer], Population Health Case Manager Phone: [phone]

## 2024-06-21 NOTE — Telephone Encounter (Signed)
 LVM with [facility] with information on the Farxiga 

## 2024-06-25 ENCOUNTER — Telehealth: Payer: Self-pay

## 2024-06-25 NOTE — Telephone Encounter (Signed)
 Spoke with Jenna with [facility] and she stated that she will spoke with the Nurse to see if they could collect

## 2024-06-25 NOTE — Telephone Encounter (Signed)
 Copied from CRM #[id number]. Topic: Clinical - Medical Advice >> Jun 25, 2024  2:23 PM Thersia BROCKS wrote: Reason for CRM: jim hoffman [facility] called in regarding patient  pt   had a fall yesterday , no injury  family notice that urine is cloudy and darker  equipment is hospital bed, and transport wheelchair,   [phone] - voicemail is confidential please leave a message  Message sent to pcp

## 2024-06-29 ENCOUNTER — Telehealth: Payer: Self-pay | Admitting: Family Medicine

## 2024-06-29 NOTE — Telephone Encounter (Signed)
 [facility] faxed Home Health Certificate (Order [place] [id number]), to be filled out by provider. Patient requested to send it back via Fax within ASAP. Document is located in providers tray at front office.Please advise at [phone].

## 2024-07-02 ENCOUNTER — Encounter: Payer: Self-pay | Admitting: Family Medicine

## 2024-07-02 ENCOUNTER — Telehealth: Payer: Self-pay

## 2024-07-02 ENCOUNTER — Ambulatory Visit: Payer: Self-pay

## 2024-07-02 NOTE — Telephone Encounter (Signed)
 FYI Only or Action Required?: Action required by provider: update on patient condition.  Patient was last seen in primary care on 03/08/2024 by [name], MD.  Called Nurse Triage reporting Fall.  Symptoms began yesterday.  Interventions attempted: Other: EMS called out, no injury noted.  Symptoms are: stable.  Triage Disposition: Call PCP Within 24 Hours  Patient/caregiver understands and will follow disposition?: Yes           Copied from CRM #[id number]. Topic: Clinical - Red Word Triage >> Jul 02, 2024  2:04 PM Harlene ORN wrote: Red Word that prompted transfer to Nurse Triage: Patient has UTI. Blood in urine this morning. Reason for Disposition  [1] Caller requests to speak ONLY to PCP AND [2] NON-URGENT question  Answer Assessment - Initial Assessment Questions 1. REASON FOR CALL or QUESTION: What is your reason for calling today? or How can I best  Physical Therapist Signe  called reporting that the patient had a fall this morning; no observed injury. During the nighttime patient got up, and fell off the bed, EMS was called, and placed patient back in bed; no injury noted.  U/A was done last week; still awaiting findings. UTI suspected. Understands she has a televisit on Wednesday, and wants to confirm that this will facilitate in getting her a hospital bed and a wheelchair. Therapist wants to know if televisit will still qualify her to receive a hospital bed for in home? Also she was supposed to take Farxiga , the medication has since been delivered and patient is taking the medication, should she continue on the medication?  Protocols used: PCP Call - No Triage-A-AH

## 2024-07-02 NOTE — Telephone Encounter (Signed)
Form has been placed in PCP box for signature ?

## 2024-07-02 NOTE — Telephone Encounter (Signed)
 Pt has an appt 11/5 with Dr. Jodie

## 2024-07-02 NOTE — Telephone Encounter (Signed)
 Copied from CRM #[id number]. Topic: Clinical - Home Health Verbal Orders >> Jul 02, 2024  2:04 PM Harlene ORN wrote:  Signe DEL - physical therapist [facility]  Physical Therapist called. Reporting that the patient had a fall this morning; no observed injury. Understands she has a televisit on Wednesday, and wants to confirm that this will facilitate in getting her a hospital bed and a wheelchair. Next, patient had a urinalysis last week and was found to have abnormalities, family reports she's had reduced cognition that contributed to the falls; she had two falls in the last couple weeks, counting the one that happened this morning. Patient has blood in the urine at this time. Currently has UTI  Phone: [phone] (secure vm)  Please see note  Message sent to PCP to address

## 2024-07-03 ENCOUNTER — Other Ambulatory Visit: Payer: Self-pay

## 2024-07-03 DIAGNOSIS — A499 Bacterial infection, unspecified: Secondary | ICD-10-CM

## 2024-07-03 DIAGNOSIS — B379 Candidiasis, unspecified: Secondary | ICD-10-CM

## 2024-07-03 MED ORDER — FLUCONAZOLE 150 MG PO TABS: 150.0000 mg | ORAL_TABLET | Freq: Every day | ORAL | 0 refills | Status: DC

## 2024-07-03 MED ORDER — AMOXICILLIN 500 MG PO TABS: 500.0000 mg | ORAL_TABLET | Freq: Two times a day (BID) | ORAL | 0 refills | Status: DC

## 2024-07-03 NOTE — Telephone Encounter (Signed)
 [facility]

## 2024-07-04 ENCOUNTER — Telehealth: Admitting: Family Medicine

## 2024-07-04 DIAGNOSIS — M7989 Other specified soft tissue disorders: Secondary | ICD-10-CM

## 2024-07-04 DIAGNOSIS — R202 Paresthesia of skin: Secondary | ICD-10-CM

## 2024-07-04 DIAGNOSIS — R29898 Other symptoms and signs involving the musculoskeletal system: Secondary | ICD-10-CM

## 2024-07-04 DIAGNOSIS — F02A0 Dementia in other diseases classified elsewhere, mild, without behavioral disturbance, psychotic disturbance, mood disturbance, and anxiety: Secondary | ICD-10-CM

## 2024-07-04 DIAGNOSIS — Z95811 Presence of heart assist device: Secondary | ICD-10-CM

## 2024-07-04 DIAGNOSIS — E119 Type 2 diabetes mellitus without complications: Secondary | ICD-10-CM

## 2024-07-04 DIAGNOSIS — M48062 Spinal stenosis, lumbar region with neurogenic claudication: Secondary | ICD-10-CM

## 2024-07-04 DIAGNOSIS — L89309 Pressure ulcer of unspecified buttock, unspecified stage: Secondary | ICD-10-CM

## 2024-07-04 DIAGNOSIS — F1120 Opioid dependence, uncomplicated: Secondary | ICD-10-CM

## 2024-07-04 DIAGNOSIS — G301 Alzheimer's disease with late onset: Secondary | ICD-10-CM

## 2024-07-04 MED ORDER — HYDROCODONE-ACETAMINOPHEN 5-325 MG PO TABS: 1.0000 | ORAL_TABLET | Freq: Two times a day (BID) | ORAL | 0 refills | Status: DC

## 2024-07-04 NOTE — Progress Notes (Signed)
 Gave orders to [facility] at [facility] for the labs that needs to be drawn

## 2024-07-04 NOTE — Patient Instructions (Signed)
 Please return in 1 month virtually for recheck.  If you have any questions or concerns, please don't hesitate to send me a message via MyChart or call the office at [phone]. Thank you for visiting with us  today! It's our pleasure caring for you.

## 2024-07-04 NOTE — Progress Notes (Signed)
 Subjective  CC:  Chief Complaint  Patient presents with   Hospitalization Follow-up    06/09/2024 - 06/14/2024 (5 days) [facility]    Requesting hospital bed and a transfer wheel chair    Virtual Visit via Video Note I connected with Krista Clarke on 07/04/24 at 11:00 AM EST by a video enabled telemedicine application and verified that I am speaking with the correct person using two identifiers. Location patient: Home Location provider: [facility] Persons participating in the virtual visit: Krista Clarke Lavern LITTIE Jodie, MD Avelina Berber, CMA  I discussed the limitations of evaluation and management by telemedicine and the availability of in person appointments. The patient expressed understanding and agreed to proceed.  HPI: Krista Clarke is a 83 y.o. female who presents to the office today to address the problems listed above in the chief complaint. Discussed the use of AI scribe software for clinical note transcription with the patient, who gave verbal consent to proceed.  History of Present Illness Krista Clarke is an 83 year old female with chronic pain and decreased mobility who presents with worsening back and leg pain. She is accompanied by her daughter and primary caregiver. Also for hospital follow-up.  I reviewed discharge summary and hospital records.  She was admitted with acute kidney injury from acute cystitis and urinary retention requiring Foley catheterization IV antibiotics.  IV fluids were given.  However, also with elevated BNP and some electrolyte abnormalities.  Upon discharge, she was sent home per her wishes.  Skilled nursing facility was recommended.  Patient would like to be at home.  She is now nonambulatory due to presumed weakness from lumbar stenosis which has progressed.  She is on chronic narcotics for pain management.  Her mental status comes and goes per her daughter, she does carry the diagnosis of dementia which may be  progressing.  No fevers or chills.  She still has the Foley catheter and has not followed up with urology yet.  She does have an appointment.  She denies fevers or chills.  Appetite is low.  Musculoskeletal pain and decreased mobility - Chronic pain involving the back, legs, and knees, described as 'hurting everywhere'. - Worsening pain over time. - Unable to stand or get up independently. - Requires frequent repositioning due to discomfort and to prevent pressure injuries.  Decubitus ulcer - Presence of a pressure ulcer on the buttocks. - Frequent repositioning required to avoid prolonged pressure. - Home health nursing is coming in to help with patient care.  Urinary retention and catheter dependence, recent possible UTI to start amoxicillin and Diflucan tomorrow. - Indwelling catheter in place due to inability to void independently. - Previous attempts to remove the catheter in the hospital were unsuccessful, necessitating reinsertion.  Altered mental status - Fluctuating mental status with periods of alertness and episodes of confusion. - Increased frequency of confusion recently.  History of dementia  Decreased appetite - Reduced appetite.  Type 2 diabetes: Has been taking her Farxiga  although it was recommended to stop at her recent discharge. - Blood glucose levels have not exceeded 130 mg/dL recently.   Assessment  1. Spinal stenosis of lumbar region with neurogenic claudication   2. Localized swelling of both lower extremities   3. Paresthesia of both lower extremities   4. Weakness of both lower extremities   5. Mild late onset Alzheimer's dementia without behavioral disturbance, psychotic disturbance, mood disturbance, or anxiety (HCC)  6. Non-insulin  dependent type 2 diabetes mellitus (HCC)   7. Pressure injury of skin of buttock, unspecified injury stage, unspecified laterality   8. Chronic narcotic dependence (HCC)   9. Presence of heart assist device [facility])       Plan  Assessment and Plan Assessment & Plan Pressure ulcer of buttock Pressure ulcer located on the buttock, higher edges in the crack part, requiring frequent repositioning to prevent prolonged pressure.  Home health nursing and wound care recommended.  Lumbar spinal stenosis with neurogenic claudication and deconditioning Chronic lumbar spinal stenosis with neurogenic claudication leading to deconditioning and inability to walk. Previous orthopedic consultation indicated progression to this stage. - Continue current management and monitor for any changes in symptoms. - Hospital bed and transfer wheelchair required given her nonambulatory status.  She has had multiple falls.  High risk.  Chronic pain syndrome Chronic pain affecting back, legs, and knees. Hydrocodone  is being used for pain management, with no adverse effects on mental status. - Continue hydrocodone  for pain management. - Refilled hydrocodone  prescription.  Twice daily dosing.  Type 2 diabetes mellitus Diabetes management is not a priority at this time. Blood sugar levels are being monitored, with a focus on preventing extremely high levels. Farxiga  is being discontinued due to potential urinary tract infections and yeast infections. - Discontinued Farxiga .  Due to risks of recurrent UTI/yeast vaginitis.  Also want to avoid dehydration and limit pill burden. - Monitor blood sugar levels to ensure she does not exceed 400 mg/dL.  We do not want tight control at this time.  Recheck A1c.  Urinary retention with indwelling catheter Urinary retention managed with an indwelling catheter. Urologist consultation is pending to determine further management of the catheter and potential removal. - Will consult urologist for management of indwelling catheter.  Alzheimer's disease, late onset Late onset Alzheimer's disease with fluctuating mental status. Recent episodes of increased confusion possibly related to a potential bladder  infection. - Monitor mental status and consider antibiotic treatment if bladder infection is confirmed.  Goals of care discussed.  Patient continues to want full care.  However I think she qualifies for palliative care.  Consult placed.  Follow up: Monthly Orders Placed This Encounter  Procedures   For home use only DME Hospital bed   DME Wheelchair manual   Amb Referral to Palliative Care   Meds ordered this encounter  Medications   HYDROcodone -acetaminophen  (NORCO/VICODIN) 5-325 MG tablet    Sig: Take 1 tablet by mouth 2 (two) times daily.    Dispense:  60 tablet    Refill:  0     I reviewed the patients updated PMH, FH, and SocHx.  Patient Active Problem List   Diagnosis Date Noted   Stage 3b chronic kidney disease (HCC) 02/07/2023    Priority: High   Diabetic nephropathy associated with type 2 diabetes mellitus (HCC) 07/21/2022    Priority: High   Complete heart block (HCC) 09/21/2021    Priority: High   Lumbar stenosis 10/31/2019    Priority: High   Chronic narcotic dependence (HCC) 07/13/2019    Priority: High   Mild late onset Alzheimer's dementia without behavioral disturbance, psychotic disturbance, mood disturbance, or anxiety (HCC) 09/18/2018    Priority: High   PAD (peripheral artery disease) 11/18/2016    Priority: High   Chronic midline low back pain with bilateral sciatica 10/02/2016    Priority: High   Severe obesity (BMI 35.0-35.9 with comorbidity) (HCC) 10/02/2016    Priority: High   Hyperlipidemia  10/02/2016    Priority: High   Essential hypertension 03/08/2016    Priority: High   Type 2 diabetes, controlled, with neuropathy (HCC) 03/08/2016    Priority: High   Pacemaker; 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, CHB 08/20/2015    Priority: High   Gout 12/03/2020    Priority: Medium    Chronic venous insufficiency 10/17/2020    Priority: Medium    Urinary incontinence in female, followed by GYN, did not tolerate  pessary 06/27/2018    Priority: Medium    LBBB (left bundle branch block) 11/18/2016    Priority: Medium    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HCC) 10/03/2016    Priority: Medium    Paresthesia of both lower extremities 10/02/2016    Priority: Medium    Chronic disease anemia 03/08/2016    Priority: Medium    Vitamin B12 deficiency 02/06/2020    Priority: Low   Presence of heart assist device (HCC) 07/04/2024   UTI (urinary tract infection) 06/09/2024   Acute on chronic heart failure with preserved ejection fraction (HFpEF) (HCC) 06/09/2024   Hypoglycemia 06/09/2024   Hemorrhagic cystitis 02/19/2024   Non-insulin  dependent type 2 diabetes mellitus (HCC) 02/19/2024   Alzheimer disease (HCC) 02/19/2024   Mood disorder 02/19/2024   GAD (generalized anxiety disorder) 02/19/2024   Peripheral neuropathy 02/19/2024   AKI (acute kidney injury) 02/19/2024   Skin necrosis (HCC) 03/30/2023   Current Meds  Medication Sig   Accu-Chek Softclix Lancets lancets Use as instructed   allopurinol  (ZYLOPRIM ) 100 MG tablet TAKE 1 TABLET BY MOUTH EVERY MORNING   amLODipine  (NORVASC ) 5 MG tablet TAKE 1 TABLET BY MOUTH EVERY DAY (Patient taking differently: Take 5 mg by mouth in the morning.)   amoxicillin (AMOXIL) 500 MG tablet Take 1 tablet (500 mg total) by mouth 2 (two) times daily.   ascorbic acid  (VITAMIN C ) 500 MG tablet TAKE 1 TABLET BY MOUTH EVERY DAY (Patient taking differently: Take 500 mg by mouth in the morning.)   ASPIRIN  LOW DOSE 81 MG chewable tablet CHEW ONE TABLET IN MOUTH EVERY DAY (Patient taking differently: Chew 81 mg by mouth in the morning.)   atorvastatin  (LIPITOR) 40 MG tablet TAKE 1 TABLET BY MOUTH AT BEDTIME   Blood Glucose Monitoring Suppl (ACCU-CHEK GUIDE ME) w/Device KIT USE AS DIRECTED   cyanocobalamin  (VITAMIN B12) 1000 MCG tablet TAKE 1 TABLET BY MOUTH EVERY DAY (Patient taking differently: Take 1,000 mcg by mouth in the morning.)   diclofenac  Sodium (VOLTAREN ) 1 % GEL Apply 4 g  topically 2 times daily at 12 noon and 4 pm.   FEROSUL 325 (65 Fe) MG tablet TAKE 1 TABLET BY MOUTH 2 TIMES DAILY   fluconazole (DIFLUCAN) 150 MG tablet Take 1 tablet (150 mg total) by mouth daily.   furosemide  (LASIX ) 40 MG tablet Take 1 tablet (40 mg total) by mouth daily. (Patient taking differently: Take 40 mg by mouth in the morning.)   glucose blood (ACCU-CHEK GUIDE TEST) test strip Use as instructed   losartan  (COZAAR ) 100 MG tablet TAKE 1 TABLET BY MOUTH EVERY DAY (Patient taking differently: Take 100 mg by mouth in the morning.)   Lutein  20 MG CAPS TAKE 1 CAPSULE BY MOUTH EVERY DAY (Patient taking differently: Take 20 mg by mouth in the morning.)   meclizine  (ANTIVERT ) 12.5 MG tablet Take 1 tablet (12.5 mg total) by mouth 2 (two) times daily as needed for dizziness.   metoprolol  succinate (TOPROL -XL) 25  MG 24 hr tablet TAKE 1 TABLET BY MOUTH EVERY DAY (Patient taking differently: Take 25 mg by mouth in the morning.)   omeprazole  (PRILOSEC) 20 MG capsule TAKE 1 CAPSULE BY MOUTH EVERY DAY BEFORE BREAKFAST   triamcinolone  cream (KENALOG ) 0.1 % Apply 1 Application topically 2 (two) times daily. For 2 weeks, then as needed   [DISCONTINUED] dapagliflozin  propanediol (FARXIGA ) 10 MG TABS tablet Take 1 tablet (10 mg total) by mouth daily. (Patient taking differently: Take 10 mg by mouth in the morning.)   [DISCONTINUED] HYDROcodone -acetaminophen  (NORCO/VICODIN) 5-325 MG tablet TAKE 1 TABLET BY MOUTH 2 TIMES DAILY   Allergies: Patient is allergic to prednisone. Family History: Patient family history includes Breast cancer in her granddaughter; Cancer in her brother; Diabetes in her mother; Hypertension in her father and mother. Social History:  Patient  reports that she has never smoked. She has never used smokeless tobacco. She reports that she does not drink alcohol  and does not use drugs.  Review of Systems: Constitutional: Negative for fever malaise or anorexia Cardiovascular: negative for  chest pain Respiratory: negative for SOB or persistent cough Gastrointestinal: negative for abdominal pain  Objective  Vitals: LMP  (LMP Unknown)  General: no acute distress , A&Ox3, lying in bed, conversive  I spent a total of 49 minutes for this patient encounter. Time spent included preparation, face-to-face counseling with the patient and coordination of care, review of chart and records, and documentation of the encounter.

## 2024-07-10 ENCOUNTER — Telehealth: Payer: Self-pay

## 2024-07-10 NOTE — Telephone Encounter (Signed)
 Copied from CRM #[id number]. Topic: Clinical - Home Health Verbal Orders >> Jul 10, 2024 11:31 AM Drema MATSU wrote: Signe stated that patient has been shaking in arms and hands in the last 2 days (unknown cause) and because of limited eating she is drinkig 2-3 ensure a day (diabetic and doesnt think blood sugar is being taken) and approval was given for hospital bed and wheelchair and they havent heard from company and is not sure what company. Patient is discharging from home health physical therapy because she is at maximum of current state but nursing will continue for patient.  I have spoken with daughter [name] and I have explain to her to contact mother's insurance carrier and see what DME is in their network and LMK and I will have the orders faxed over. Waiting on response.

## 2024-07-13 ENCOUNTER — Telehealth: Payer: Self-pay

## 2024-07-13 ENCOUNTER — Other Ambulatory Visit: Payer: Self-pay

## 2024-07-13 NOTE — Telephone Encounter (Signed)
 Copied from CRM #[id number]. Topic: Clinical - Medication Question >> Jul 12, 2024  1:17 PM Emylou G wrote: Reason for CRM: daughter called [name] - Avelina ( nurse ) - Send over orders, last notes concerning hospital bed, insurance info/ address.. need air mattress for the order to [facility] [phone] .SABRA send message back on my chart if all is okay and she needs anything else.  Orders has been faxed

## 2024-07-17 ENCOUNTER — Other Ambulatory Visit: Payer: Self-pay

## 2024-07-17 ENCOUNTER — Encounter (HOSPITAL_COMMUNITY): Payer: Self-pay | Admitting: *Deleted

## 2024-07-17 ENCOUNTER — Inpatient Hospital Stay (HOSPITAL_COMMUNITY)
Admission: EM | Admit: 2024-07-17 | Discharge: 2024-07-24 | DRG: 698 | Disposition: A | Discharge status: Home / Self Care | Attending: Internal Medicine | Admitting: Internal Medicine

## 2024-07-17 ENCOUNTER — Encounter: Payer: Self-pay | Admitting: Family Medicine

## 2024-07-17 ENCOUNTER — Other Ambulatory Visit: Payer: Self-pay | Admitting: Family Medicine

## 2024-07-17 DIAGNOSIS — T378X5A Adverse effect of other specified systemic anti-infectives and antiparasitics, initial encounter: Secondary | ICD-10-CM | POA: Diagnosis present

## 2024-07-17 DIAGNOSIS — E119 Type 2 diabetes mellitus without complications: Secondary | ICD-10-CM | POA: Diagnosis present

## 2024-07-17 DIAGNOSIS — N760 Acute vaginitis: Secondary | ICD-10-CM | POA: Diagnosis present

## 2024-07-17 DIAGNOSIS — F0390 Unspecified dementia without behavioral disturbance: Secondary | ICD-10-CM | POA: Diagnosis present

## 2024-07-17 DIAGNOSIS — Z8679 Personal history of other diseases of the circulatory system: Secondary | ICD-10-CM

## 2024-07-17 DIAGNOSIS — G894 Chronic pain syndrome: Secondary | ICD-10-CM | POA: Diagnosis present

## 2024-07-17 DIAGNOSIS — Z8249 Family history of ischemic heart disease and other diseases of the circulatory system: Secondary | ICD-10-CM

## 2024-07-17 DIAGNOSIS — Z6832 Body mass index (BMI) 32.0-32.9, adult: Secondary | ICD-10-CM

## 2024-07-17 DIAGNOSIS — N179 Acute kidney failure, unspecified: Secondary | ICD-10-CM | POA: Diagnosis not present

## 2024-07-17 DIAGNOSIS — Z79891 Long term (current) use of opiate analgesic: Secondary | ICD-10-CM

## 2024-07-17 DIAGNOSIS — G9341 Metabolic encephalopathy: Secondary | ICD-10-CM | POA: Diagnosis present

## 2024-07-17 DIAGNOSIS — E860 Dehydration: Secondary | ICD-10-CM | POA: Diagnosis present

## 2024-07-17 DIAGNOSIS — Z87440 Personal history of urinary (tract) infections: Secondary | ICD-10-CM

## 2024-07-17 DIAGNOSIS — T83511A Infection and inflammatory reaction due to indwelling urethral catheter, initial encounter: Principal | ICD-10-CM | POA: Diagnosis present

## 2024-07-17 DIAGNOSIS — R404 Transient alteration of awareness: Principal | ICD-10-CM

## 2024-07-17 DIAGNOSIS — E66811 Obesity, class 1: Secondary | ICD-10-CM | POA: Diagnosis present

## 2024-07-17 DIAGNOSIS — Z888 Allergy status to other drugs, medicaments and biological substances status: Secondary | ICD-10-CM

## 2024-07-17 DIAGNOSIS — Z950 Presence of cardiac pacemaker: Secondary | ICD-10-CM

## 2024-07-17 DIAGNOSIS — D638 Anemia in other chronic diseases classified elsewhere: Secondary | ICD-10-CM | POA: Diagnosis present

## 2024-07-17 DIAGNOSIS — Z90710 Acquired absence of both cervix and uterus: Secondary | ICD-10-CM

## 2024-07-17 DIAGNOSIS — N19 Unspecified kidney failure: Secondary | ICD-10-CM | POA: Diagnosis present

## 2024-07-17 DIAGNOSIS — N3000 Acute cystitis without hematuria: Secondary | ICD-10-CM | POA: Diagnosis not present

## 2024-07-17 DIAGNOSIS — Y846 Urinary catheterization as the cause of abnormal reaction of the patient, or of later complication, without mention of misadventure at the time of the procedure: Secondary | ICD-10-CM | POA: Diagnosis present

## 2024-07-17 DIAGNOSIS — E785 Hyperlipidemia, unspecified: Secondary | ICD-10-CM | POA: Diagnosis present

## 2024-07-17 DIAGNOSIS — Z79899 Other long term (current) drug therapy: Secondary | ICD-10-CM

## 2024-07-17 DIAGNOSIS — I10 Essential (primary) hypertension: Secondary | ICD-10-CM | POA: Diagnosis present

## 2024-07-17 DIAGNOSIS — Z833 Family history of diabetes mellitus: Secondary | ICD-10-CM

## 2024-07-17 DIAGNOSIS — E7800 Pure hypercholesterolemia, unspecified: Secondary | ICD-10-CM | POA: Diagnosis present

## 2024-07-17 DIAGNOSIS — Z803 Family history of malignant neoplasm of breast: Secondary | ICD-10-CM

## 2024-07-17 LAB — BASIC METABOLIC PANEL WITH GFR
Anion gap: 11 (ref 5–15)
BUN: 75 mg/dL — ABNORMAL HIGH (ref 8–23)
CO2: 29 mmol/L (ref 22–32)
Calcium: 8.2 mg/dL — ABNORMAL LOW (ref 8.9–10.3)
Chloride: 93 mmol/L — ABNORMAL LOW (ref 98–111)
Creatinine, Ser: 1.92 mg/dL — ABNORMAL HIGH (ref 0.44–1.00)
GFR, Estimated: 25 mL/min — ABNORMAL LOW (ref >60)
Glucose, Bld: 137 mg/dL — ABNORMAL HIGH (ref 70–99)
Potassium: 4.8 mmol/L (ref 3.5–5.1)
Sodium: 133 mmol/L — ABNORMAL LOW (ref 135–145)

## 2024-07-17 LAB — CBC WITH DIFFERENTIAL/PLATELET
Abs Immature Granulocytes: 0.08 K/uL — ABNORMAL HIGH (ref 0.00–0.07)
Basophils Absolute: 0 K/uL (ref 0.0–0.1)
Basophils Relative: 0 %
Eosinophils Absolute: 0 K/uL (ref 0.0–0.5)
Eosinophils Relative: 0 %
HCT: 30.4 % — ABNORMAL LOW (ref 36.0–46.0)
Hemoglobin: 9.5 g/dL — ABNORMAL LOW (ref 12.0–15.0)
Immature Granulocytes: 1 %
Lymphocytes Relative: 18 %
Lymphs Abs: 1.9 K/uL (ref 0.7–4.0)
MCH: 25.5 pg — ABNORMAL LOW (ref 26.0–34.0)
MCHC: 31.3 g/dL (ref 30.0–36.0)
MCV: 81.5 fL (ref 80.0–100.0)
Monocytes Absolute: 0.8 K/uL (ref 0.1–1.0)
Monocytes Relative: 8 %
Neutro Abs: 7.6 K/uL (ref 1.7–7.7)
Neutrophils Relative %: 73 %
Platelets: 392 K/uL (ref 150–400)
RBC: 3.73 MIL/uL — ABNORMAL LOW (ref 3.87–5.11)
RDW: 17.6 % — ABNORMAL HIGH (ref 11.5–15.5)
WBC: 10.4 K/uL (ref 4.0–10.5)
nRBC: 0 % (ref 0.0–0.2)

## 2024-07-17 LAB — URINALYSIS, ROUTINE W REFLEX MICROSCOPIC
Bilirubin Urine: NEGATIVE
Glucose, UA: NEGATIVE mg/dL
Hgb urine dipstick: NEGATIVE
Ketones, ur: NEGATIVE mg/dL
Nitrite: NEGATIVE
Protein, ur: 30 mg/dL — AB
Specific Gravity, Urine: 1.011 (ref 1.005–1.030)
WBC, UA: >50 WBC/hpf (ref 0–5)
pH: 7 (ref 5.0–8.0)

## 2024-07-17 MED ORDER — SODIUM CHLORIDE 0.9 % IV SOLN
1.0000 g | Freq: Every day | INTRAVENOUS | Status: DC
  Administered 2024-07-18 – 2024-07-23 (×7): 1 g via INTRAVENOUS
  Filled 2024-07-17 (×7): qty 10

## 2024-07-17 MED ORDER — SODIUM CHLORIDE 0.9 % IV BOLUS
500.0000 mL | Freq: Once | INTRAVENOUS | Status: AC
  Administered 2024-07-17: 500 mL via INTRAVENOUS

## 2024-07-17 MED ORDER — HYDROCODONE-ACETAMINOPHEN 5-325 MG PO TABS
1.0000 | ORAL_TABLET | Freq: Two times a day (BID) | ORAL | Status: DC | PRN
Start: 2024-07-17 — End: 2024-07-23
  Administered 2024-07-18 – 2024-07-23 (×10): 1 via ORAL
  Filled 2024-07-17 (×11): qty 1

## 2024-07-17 MED ORDER — LACTATED RINGERS IV SOLN: INTRAVENOUS | Status: AC

## 2024-07-17 MED ORDER — FERROUS SULFATE 325 (65 FE) MG PO TABS
325.0000 mg | ORAL_TABLET | Freq: Two times a day (BID) | ORAL | Status: DC
  Administered 2024-07-18 – 2024-07-24 (×13): 325 mg via ORAL
  Filled 2024-07-17 (×13): qty 1

## 2024-07-17 MED ORDER — HYDROCODONE-ACETAMINOPHEN 5-325 MG PO TABS
1.0000 | ORAL_TABLET | Freq: Once | ORAL | Status: AC
  Administered 2024-07-17: 1 via ORAL
  Filled 2024-07-17: qty 1

## 2024-07-17 MED ORDER — ASPIRIN 81 MG PO CHEW
81.0000 mg | CHEWABLE_TABLET | Freq: Every day | ORAL | Status: DC
  Administered 2024-07-18 – 2024-07-24 (×7): 81 mg via ORAL
  Filled 2024-07-17 (×7): qty 1

## 2024-07-17 MED ORDER — ACETAMINOPHEN 325 MG PO TABS
650.0000 mg | ORAL_TABLET | Freq: Four times a day (QID) | ORAL | Status: DC | PRN
  Administered 2024-07-20 – 2024-07-23 (×3): 650 mg via ORAL
  Filled 2024-07-17 (×3): qty 2

## 2024-07-17 MED ORDER — METOPROLOL SUCCINATE ER 25 MG PO TB24
25.0000 mg | ORAL_TABLET | Freq: Every day | ORAL | Status: DC
  Administered 2024-07-18 – 2024-07-22 (×5): 25 mg via ORAL
  Filled 2024-07-17 (×5): qty 1

## 2024-07-17 MED ORDER — MELATONIN 3 MG PO TABS
3.0000 mg | ORAL_TABLET | Freq: Every evening | ORAL | Status: DC | PRN
  Administered 2024-07-18 – 2024-07-20 (×2): 3 mg via ORAL
  Filled 2024-07-17 (×2): qty 1

## 2024-07-17 MED ORDER — ONDANSETRON HCL 4 MG/2ML IJ SOLN: 4.0000 mg | Freq: Four times a day (QID) | INTRAMUSCULAR | Status: DC | PRN

## 2024-07-17 MED ORDER — ACETAMINOPHEN 650 MG RE SUPP: 650.0000 mg | Freq: Four times a day (QID) | RECTAL | Status: DC | PRN

## 2024-07-17 MED ORDER — METRONIDAZOLE 500 MG PO TABS
500.0000 mg | ORAL_TABLET | Freq: Two times a day (BID) | ORAL | Status: DC
  Administered 2024-07-17 – 2024-07-24 (×14): 500 mg via ORAL
  Filled 2024-07-17 (×14): qty 1

## 2024-07-17 MED ORDER — ATORVASTATIN CALCIUM 40 MG PO TABS
40.0000 mg | ORAL_TABLET | Freq: Every day | ORAL | Status: DC
  Administered 2024-07-18 – 2024-07-23 (×7): 40 mg via ORAL
  Filled 2024-07-17 (×7): qty 1

## 2024-07-17 MED ORDER — SODIUM CHLORIDE 0.9 % IV BOLUS: 1000.0000 mL | Freq: Once | INTRAVENOUS | Status: DC

## 2024-07-17 MED ORDER — INSULIN ASPART 100 UNIT/ML IJ SOLN
0.0000 [IU] | Freq: Three times a day (TID) | INTRAMUSCULAR | Status: DC
  Administered 2024-07-19: 1 [IU] via SUBCUTANEOUS
  Administered 2024-07-21: 2 [IU] via SUBCUTANEOUS
  Administered 2024-07-21: 1 [IU] via SUBCUTANEOUS
  Administered 2024-07-22: 3 [IU] via SUBCUTANEOUS
  Administered 2024-07-22: 2 [IU] via SUBCUTANEOUS
  Filled 2024-07-17 (×2): qty 1
  Filled 2024-07-17: qty 3
  Filled 2024-07-17 (×2): qty 2
  Filled 2024-07-17: qty 5

## 2024-07-17 NOTE — H&P (Signed)
 History and Physical      Krista Clarke FMW:[id number] DOB: [birth year] DOA: 07/17/2024; DOS: 07/17/2024  PCP: Jodie Lavern LITTIE, MD  Patient coming from: home   I have personally briefly reviewed patient's old medical records in [facility]  Chief Complaint: Altered mental status  HPI: Krista Clarke is a 83 y.o. female with medical history significant for dementia, complete heart block status post [facility] Jude's pacemaker in December 2016, type 2 diabetes mellitus, essential pretension, hyperlipidemia, chronic pain syndrome on chronic opiate therapy, chronic indwelling Foley catheter, anemia of chronic disease with baseline hemoglobin 8.5-11, who is admitted to [facility] on 07/17/2024 with acute kidney injury after presenting from home to [facility] ED complaining of altered mental status.   In the setting of the patient's altered mental status superimposed on underlying dementia, the following history is provided by the patient as well as the patient's daughter, in addition to my discussions with the EDP and via chart review.  [name], with whom the patient lives, conveys that the patient has been confused relative to her baseline mental status, with underlying diagnosis of dementia, over the course of the last 2 days.  Patient has a chronic indwelling Foley catheter, which daughter reports was most recently changed 28 days ago.  Over the last few days, daughter conveys that the patient has been indicating that she is experiencing some lower abdominal discomfort.  She is also been experiencing some recent vaginal discharge following course of Diflucan.  In the setting of the patient's recent confusion relative to baseline mental status, daughter given that the patient has exhibited diminished oral intake of both food and water over the last few days.  Per chart review, it appears that the patient has been hospitalized on multiple occasions for acute kidney injury.  Following improvement  in renal function during these hospitalizations, it appears that her baseline creatinine is in the range of 1-1.2, with most recent prior creatinine data point noted to be 1.04 on 06/13/2024.  Outpatient medications notable for scheduled Norco as well as following antihypertensive medications, amlodipine , Lasix , losartan .  She has a history of complete heart block status post pacemaker in December 2016, with most recent echocardiogram performed on 06/10/2024 notable for the following: LVEF 60 to 65%, no evidence of wall motion maladies, grade 1 diastolic dysfunction, normal right ventricular systolic function, mildly dilated left atrium and no evidence of significant valvular pathology.     ED Course:  Vital signs in the ED were notable for the following: Afebrile; heart rates in the 60s to 80s; systolic blood pressures in the high 80s to 1 teens, most recent blood pressure noted to be 110/65, respiratory rate 1718, oxygen saturation 99 to 100% on room air.  Labs were notable for the following: BMP was notable for the following: Bicarbonate 29, anion gap 11, BUN 75, creatinine 1.92, BUN DeGroote ratio 39, glucose 137.  In the setting of report of vaginal discharge, vaginal wet prep has been ordered, with result currently pending.  CBC notable for white blood cell count 10,400, hemoglobin 9.5 associated Neuraceq/recurrent properties and relative to most recent prior hemoglobin 8.8.7 on 06/10/2024, platelet count 392.  Urine culture collected prior to initiation of IV biotics.  Per my interpretation, EKG in ED demonstrated the following: No EKG performed in the ED today.  Imaging in the ED, per corresponding formal radiology read, was notable for the following: No imaging performed in the ED today.  Of note, patient's chronic indwelling  Foley catheter was changed in the emergency department today.  While in the ED, the following were administered: Norco 5/325 mg p.o. x 1 dose, Flagyl 500 mg p.o. x  1 dose, normal saline as 500 cc bolus.  Subsequently, the patient was admitted for further evaluation management of presenting acute kidney injury as well as evidence of acute cystitis, concern for bacterial vaginosis, with presentation also notable for suspected acute metabolic encephalopathy.    Review of Systems: As per HPI otherwise 10 point review of systems negative.   Past Medical History:  Diagnosis Date   Anemia    takes iron daily   Arthritis    Complete heart block (HCC) 08/20/2015   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     Diabetes mellitus without complication (HCC)    Encounter for care of pacemaker 05/24/2019   Hypercholesteremia    Hypertension    Pacemaker; 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 Addie 2010 lumbar MRI, multilevel and severe.    Past Surgical History:  Procedure Laterality Date   ABDOMINAL HYSTERECTOMY     COLONOSCOPY WITH PROPOFOL  N/A 07/03/2013   Procedure: COLONOSCOPY WITH PROPOFOL ;  Surgeon: Gladis MARLA Louder, MD;  Location: [facility] ENDOSCOPY;  Service: Endoscopy;  Laterality: N/A;   EP IMPLANTABLE DEVICE N/A 08/20/2015   Procedure: Pacemaker Implant;  Surgeon: Danelle LELON Birmingham, MD;  Location: [facility] INVASIVE CV LAB;  Service: Cardiovascular;  Laterality: N/A;   JOINT REPLACEMENT Bilateral    '99. Knees   LEFT HEART CATHETERIZATION WITH CORONARY ANGIOGRAM N/A 08/27/2014   Procedure: LEFT HEART CATHETERIZATION WITH CORONARY ANGIOGRAM;  Surgeon: Erick JONELLE Bergamo, MD;  Location: [facility] CATH LAB;  Service: Cardiovascular;  Laterality: N/A;   TUBAL LIGATION      Social History:  reports that she has never smoked. She has never used smokeless tobacco. She reports that she does not drink alcohol  and does not use drugs.   Allergies  Allergen Reactions   Prednisone Other (See Comments), Palpitations and Rash    Doesn't work  well for patient    Family History  Problem Relation Age of Onset   Hypertension Mother    Diabetes Mother    Hypertension Father    Cancer Brother    Breast cancer Granddaughter        30s    Family history reviewed and not pertinent    Prior to Admission medications   Medication Sig Start Date End Date Taking? Authorizing Provider  Accu-Chek Softclix Lancets lancets Use as instructed 03/08/24   [name], MD  allopurinol  (ZYLOPRIM ) 100 MG tablet TAKE 1 TABLET BY MOUTH EVERY MORNING 09/15/23   [name], MD  amLODipine  (NORVASC ) 5 MG tablet TAKE 1 TABLET BY MOUTH EVERY DAY Patient taking differently: Take 5 mg by mouth in the morning. 12/08/23   [name], MD  amoxicillin (AMOXIL) 500 MG tablet Take 1 tablet (500 mg total) by mouth 2 (two) times daily. 07/03/24   [name], MD  ascorbic acid  (VITAMIN C ) 500 MG tablet TAKE 1 TABLET BY MOUTH EVERY DAY Patient taking differently: Take 500 mg by mouth in the morning. 11/08/23   [name], MD  ASPIRIN  LOW DOSE 81 MG chewable tablet CHEW ONE TABLET IN MOUTH EVERY DAY Patient taking differently: Chew 81 mg by mouth in the morning. 02/03/24  [name], MD  atorvastatin  (LIPITOR) 40 MG tablet TAKE 1 TABLET BY MOUTH AT BEDTIME 05/14/24   [name], MD  Blood Glucose Monitoring Suppl (ACCU-CHEK GUIDE ME) w/Device KIT USE AS DIRECTED 03/08/24   [name], MD  cyanocobalamin  (VITAMIN B12) 1000 MCG tablet TAKE 1 TABLET BY MOUTH EVERY DAY Patient taking differently: Take 1,000 mcg by mouth in the morning. 12/08/23   [name], MD  diclofenac  Sodium (VOLTAREN ) 1 % GEL Apply 4 g topically 2 times daily at 12 noon and 4 pm. 06/18/24   [name], MD  FEROSUL 325 (65 Fe) MG tablet TAKE 1 TABLET BY MOUTH 2 TIMES DAILY 07/17/24   [name], MD  fluconazole (DIFLUCAN) 150 MG tablet Take 1 tablet (150 mg total) by mouth daily. 07/03/24   [name], MD  furosemide  (LASIX ) 40 MG tablet Take 1  tablet (40 mg total) by mouth daily. Patient taking differently: Take 40 mg by mouth in the morning. 03/27/24   [name], MD  glucose blood (ACCU-CHEK GUIDE TEST) test strip Use as instructed 03/08/24   [name], MD  HYDROcodone -acetaminophen  (NORCO/VICODIN) 5-325 MG tablet Take 1 tablet by mouth 2 (two) times daily. 07/04/24   [name], MD  losartan  (COZAAR ) 100 MG tablet TAKE 1 TABLET BY MOUTH EVERY DAY Patient taking differently: Take 100 mg by mouth in the morning. 09/15/23   [name], MD  Lutein  20 MG CAPS TAKE 1 CAPSULE BY MOUTH EVERY DAY Patient taking differently: Take 20 mg by mouth in the morning. 11/08/23   [name], MD  meclizine  (ANTIVERT ) 12.5 MG tablet Take 1 tablet (12.5 mg total) by mouth 2 (two) times daily as needed for dizziness. 06/18/24   [name], MD  metoprolol  succinate (TOPROL -XL) 25 MG 24 hr tablet TAKE 1 TABLET BY MOUTH EVERY DAY Patient taking differently: Take 25 mg by mouth in the morning. 12/08/23   [name], MD  omeprazole  (PRILOSEC) 20 MG capsule TAKE 1 CAPSULE BY MOUTH EVERY DAY BEFORE BREAKFAST 10/12/23   [name], MD  triamcinolone  cream (KENALOG ) 0.1 % Apply 1 Application topically 2 (two) times daily. For 2 weeks, then as needed 06/18/24   [name], MD     Objective    Physical Exam: Vitals:   07/17/24 1710 07/17/24 1731 07/17/24 1830 07/17/24 1906  BP:   111/60 (!) 101/59  Pulse:   70 69  Resp:    18  Temp:      TempSrc:      SpO2: 95%  100% 100%  Weight:  83.5 kg      General: appears to be stated age; alert, confused Skin: warm, dry, no rash Head:  AT/[place] Mouth:  Oral mucosa membranes appear dry, normal dentition Neck: supple; trachea midline Chest: Pacemaker noted Heart:  RRR; did not appreciate any M/R/G Lungs: CTAB, did not appreciate any wheezes, rales, or rhonchi Abdomen: + BS; soft, ND, NT Vascular: 2+ pedal pulses b/l; 2+ radial pulses b/l Extremities: no peripheral edema,  no muscle wasting        Labs on Admission: I have personally reviewed following labs and imaging studies  CBC: Recent Labs  Lab 07/17/24 2047  WBC 10.4  NEUTROABS 7.6  HGB 9.5*  HCT 30.4*  MCV 81.5  PLT 392   Basic Metabolic Panel: Recent Labs  Lab 07/17/24 2047  NA 133*  K 4.8  CL 93*  CO2 29  GLUCOSE 137*  BUN 75*  CREATININE 1.92*  CALCIUM  8.2*   GFR: Estimated Creatinine Clearance: 21.4 mL/min (A) (by C-G formula based on SCr of 1.92 mg/dL (H)). Liver Function Tests: No results for input(s): AST, ALT, ALKPHOS, BILITOT, PROT, ALBUMIN in the last 168 hours. No results for input(s): LIPASE, AMYLASE in the last 168 hours. No results for input(s): AMMONIA in the last 168 hours. Coagulation Profile: No results for input(s): INR, PROTIME in the last 168 hours. Cardiac Enzymes: No results for input(s): CKTOTAL, CKMB, CKMBINDEX, TROPONINI in the last 168 hours. BNP (last 3 results) Recent Labs    03/08/24 1117 06/09/24 1825 06/10/24 0422  PROBNP 76.0 2,292.0* 1,632.0*   HbA1C: No results for input(s): HGBA1C in the last 72 hours. CBG: No results for input(s): GLUCAP in the last 168 hours. Lipid Profile: No results for input(s): CHOL, HDL, LDLCALC, TRIG, CHOLHDL, LDLDIRECT in the last 72 hours. Thyroid  Function Tests: No results for input(s): TSH, T4TOTAL, FREET4, T3FREE, THYROIDAB in the last 72 hours. Anemia Panel: No results for input(s): VITAMINB12, FOLATE, FERRITIN, TIBC, IRON, RETICCTPCT in the last 72 hours. Urine analysis:    Component Value Date/Time   COLORURINE YELLOW 07/17/2024 2138   APPEARANCEUR CLOUDY (A) 07/17/2024 2138   LABSPEC 1.011 07/17/2024 2138   PHURINE 7.0 07/17/2024 2138   GLUCOSEU NEGATIVE 07/17/2024 2138   GLUCOSEU NEGATIVE 10/17/2020 1202   HGBUR NEGATIVE 07/17/2024 2138   BILIRUBINUR NEGATIVE 07/17/2024 2138   BILIRUBINUR 1+ 10/17/2020 1105   KETONESUR  NEGATIVE 07/17/2024 2138   PROTEINUR 30 (A) 07/17/2024 2138   UROBILINOGEN 0.2 02/26/2022 1800   NITRITE NEGATIVE 07/17/2024 2138   LEUKOCYTESUR LARGE (A) 07/17/2024 2138    Radiological Exams on Admission: No results found.    Assessment/Plan   Principal Problem:   AKI (acute kidney injury) Active Problems:   DM2 (diabetes mellitus, type 2) (HCC)   History of essential hypertension   Anemia of chronic disease   HLD (hyperlipidemia)   Acute prerenal azotemia   Acute cystitis   Acute metabolic encephalopathy       #) Acute Kidney Injury:   Relative to baseline creatinine 1-1.2, with most recent prior creatinine did 1 of 1.04 on 06/13/2024, presenting creatinine is elevated to 1.92, which appears prerenal in nature in the setting of dehydration given report of recent decline in oral intake, as above, and consistent with finding of acute prerenal azotemia with BUN to creatinine ratio of 39, without any clinical evidence to suggest acute upper gastrointestinal bleed at this time.  Suspect that her diminished oral intake has been compounded by him use of Lasix , as well as potential additional chronic logic factors to include outpatient losartan .   Presenting urinalysis with microscopy shows greater than 50 white blood cells, 30 protein, no RBCs, but was positive for hyaline cast, consistent with a picture of dehydration.  She has received a 500 cc NS bolus in the ED this evening.  Plan: monitor strict I's & O's and daily weights. Attempt to avoid nephrotoxic agents.  Hold home losartan , Lasix  for now. refrain from NSAIDs. Repeat CMP in the morning. Check serum magnesium  level. Add-on random urine sodium and random urine creatinine.  Add on CPK level given concomitant acute metabolic encephalopathy.  Given increased risk for ensuing development of hypotension on Norvasc , will hold the latter for now.  Lactated Ringer 's at 75 cc/h x 10 hours.                      #)  Acute cystitis: Diagnosis on the basis of the patient's report of recent suprapubic discomfort, with presenting urinalysis appearing to be consistent with infection in the form of cloudy appearing specimen with significant pyuria, large leukocyte esterase.  She has a history of chronic indwelling Foley catheter, most recently changed 28 days ago.  After Foley catheter change in the emergency department this evening, the above urine sample was collected for the updated Foley catheter. I can not say that the patient's chronic indwelling foley catheter caused her UTI, but it did increase her risk for development of uti. For the point of reemphasizing: The patient's Foley catheter was present at the time of her arrival in the ED.   In the absence of episode of greater than 12,000 in the absence of objective fever, SIRS arteria for sepsis are not currently met.  No evidence of hypotension.  Urine culture collected in the ED.  Plan: Lactated Ringer 's, as above.  Monitor strict I's and O's and daily weights.  Follow-up result urine culture.  Start Rocephin .  Repeat CBC in the morning.                  #) Bacterial vaginosis: Suspected in the setting of reported recent vaginal discharge, noting that the patient has recently completed a course of Diflucan.  Vaginal wet prep was ordered in the ED, with result currently pending.  She has been started on oral Flagyl in the ED, which will be continued for now.  Plan: Continue oral Flagyl, as above.  Follow-up result of wet prep.  CBC in the morning.                         #) Acute metabolic encephalopathy: Altered mental status, confusion relative to her baseline dementia, over the course of the last 2 days according to the patient's daughter, which appears to be on the basis of multiple physiologic stressors, including potentially multiple underlying infectious processes, including suspected acute cystitis along with bacterial  vaginosis as well as contribution from dehydration as evidenced by dry oral mucosa membranes, significant acute prerenal azotemia, as well as resultant acute kidney injury.   No additional overt metabolic/electrolyte contributions at this time.  Additionally, potential pharmacologic factors that may also be contributing to the patient's acute encephalopathy include her use of scheduled Norco at home, in addition to omeprazole .  Continue home Norco, but transition it to prn during this hospitalization.  No overt acute focal neurologic deficits to suggest a contribution from an underlying acute CVA. Seizures are also felt to be less likely.  Plan: fall precautions. Delirium precautions. Repeat CMP/CBC in the AM. Check Mg level. check TSH, B12 level.  Check ammonia, CPK, urinary drug screen.  For now, will change home scheduled Norco to prn.  Hold home omeprazole .  Further evaluation management of presenting acute cystitis, bacterial vaginosis, acute kidney injury, and dehydration, as above.  Gentle IV fluids overnight in the form of lactated Ringer 's at 65 cc/h.                   #) Type 2 Diabetes Mellitus: documented history of such.  Appears to be managed via lifestyle modifications, in the absence of any outpatient use of insulin  or oral hypoglycemic agents.  This is in the context of most recent hemoglobin A1c it was found to be at goal, with hemoglobin A1c noted to be 6.9% when checked on 03/08/2024.  Plan: accuchecks QAC and HS with low  dose SSI.  Add on hemoglobin A1c level.                         #) Essential Hypertension: documented h/o such, with outpatient antihypertensive regimen including metoprolol  succinate, losartan , Lasix , Norvasc .  SBP's in the ED today: In the high 90s to 1 teens mmHg. in the setting of presenting acute kidney injury and dehydration, will hold home Lasix  and losartan .  Additionally, in setting of her AKI, she is at increased risk  for hypotension as a consequence of underlying Norvasc .  Consequently, we will hold home Norvasc  for now, will closely monitoring ensuing renal function trend.  She would otherwise be at risk for development of bradycardia from the acute kidney injury in the setting of her home beta-blocker.  However, she does have a pacemaker present largely alleviating concerns for the latter.  Plan: Close monitoring of subsequent BP via routine VS. hold home amlodipine , losartan , and Lasix , as above.  Resume home beta-blocker.  Monitor on symmetry.  Monitor strict I's and O's and daily weights.  Further evaluation management of acute kidney injury, as above.                          #) Hyperlipidemia: documented h/o such. On high intensity atorvastatin  as outpatient.   Plan: continue home statin.  Add on CPK level in the context of acute kidney injury as well as acute metabolic encephalopathy.                      #) Anemia of chronic disease: Documented history of such, a/w with baseline hgb range 8.5-11, with presenting hgb consistent with this range, in the absence of any overt evidence of active bleed.  Noted to be on oral iron supplementation at home.   Plan: Repeat CBC in the morning.  Check INR.  Continue outpatient oral iron supplementation.        DVT prophylaxis: SCD's   Code Status: Full code Family Communication: none Disposition Plan: Per Rounding Team Consults called: none;  Admission status: inpatient     I SPENT GREATER THAN 75  MINUTES IN CLINICAL CARE TIME/MEDICAL DECISION-MAKING IN COMPLETING THIS ADMISSION.      Eva NOVAK Kaisy Severino DO [facility]  From 7PM - 7AM   07/17/2024, 10:11 PM

## 2024-07-17 NOTE — ED Notes (Signed)
 Phlebotomy team stuck patient three times. Unable to collect labs. RN aware

## 2024-07-17 NOTE — ED Notes (Signed)
 EDP at [facility]

## 2024-07-17 NOTE — ED Notes (Signed)
 Unable to obtain urine sample. Urine returned was not enough to collect for sample.

## 2024-07-17 NOTE — ED Notes (Signed)
 EDP notified that phlebotomy was unable to get labs, EDP also aware of patient not making enough urine output for a UA.

## 2024-07-17 NOTE — ED Provider Notes (Signed)
 [facility] Provider Note   CSN: [id number] Arrival date & time: 07/17/24  1700     Patient presents with: Altered Mental Status   Krista Clarke is a 83 y.o. female.   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  Per patient's daughter she has been more confused and complaining of abdominal pain and some burning with urination.  They have also noticed a smell and copious amounts of discharge from her vagina.  Patient endorses chronic leg and back pain and does report some mild lower abdominal pain but otherwise has no complaints.  The history is provided by the patient and a relative.  Altered Mental Status      Prior to Admission medications   Medication Sig Start Date End Date Taking? Authorizing Provider  Accu-Chek Softclix Lancets lancets Use as instructed 03/08/24   Jodie Lavern LITTIE, MD  allopurinol  (ZYLOPRIM ) 100 MG tablet TAKE 1 TABLET BY MOUTH EVERY MORNING 09/15/23   Jodie Lavern LITTIE, MD  amLODipine  (NORVASC ) 5 MG tablet TAKE 1 TABLET BY MOUTH EVERY DAY Patient taking differently: Take 5 mg by mouth in the morning. 12/08/23   Jodie Lavern LITTIE, MD  amoxicillin  (AMOXIL ) 500 MG tablet Take 1 tablet (500 mg total) by mouth 2 (two) times daily. 07/03/24   Jodie Lavern LITTIE, MD  ascorbic acid  (VITAMIN C ) 500 MG tablet TAKE 1 TABLET BY MOUTH EVERY DAY Patient taking differently: Take 500 mg by mouth in the morning. 11/08/23   Jodie Lavern LITTIE, MD  ASPIRIN  LOW DOSE 81 MG chewable tablet CHEW ONE TABLET IN MOUTH EVERY DAY Patient taking differently: Chew 81 mg by mouth in the morning. 02/03/24   Jodie Lavern LITTIE, MD  atorvastatin  (LIPITOR) 40 MG tablet TAKE 1 TABLET BY MOUTH AT BEDTIME 05/14/24   Jodie Lavern LITTIE, MD  Blood Glucose Monitoring Suppl (ACCU-CHEK GUIDE ME) w/Device KIT  USE AS DIRECTED 03/08/24   Jodie Lavern LITTIE, MD  cyanocobalamin  (VITAMIN B12) 1000 MCG tablet TAKE 1 TABLET BY MOUTH EVERY DAY Patient taking differently: Take 1,000 mcg by mouth in the morning. 12/08/23   Jodie Lavern LITTIE, MD  diclofenac  Sodium (VOLTAREN ) 1 % GEL Apply 4 g topically 2 times daily at 12 noon and 4 pm. 06/18/24   Jodie Lavern LITTIE, MD  FEROSUL 325 (65 Fe) MG tablet TAKE 1 TABLET BY MOUTH 2 TIMES DAILY 07/17/24   Jodie Lavern LITTIE, MD  fluconazole  (DIFLUCAN ) 150 MG tablet Take 1 tablet (150 mg total) by mouth daily. 07/03/24   Jodie Lavern LITTIE, MD  furosemide  (LASIX ) 40 MG tablet Take 1 tablet (40 mg total) by mouth daily. Patient taking differently: Take 40 mg by mouth in the morning. 03/27/24   Jodie Lavern LITTIE, MD  glucose blood (ACCU-CHEK GUIDE TEST) test strip Use as instructed 03/08/24   Jodie Lavern LITTIE, MD  HYDROcodone -acetaminophen  (NORCO/VICODIN) 5-325 MG tablet Take 1 tablet by mouth 2 (two) times daily. 07/04/24   Jodie Lavern LITTIE, MD  losartan  (COZAAR ) 100 MG tablet TAKE 1 TABLET BY MOUTH EVERY DAY Patient taking differently: Take 100 mg by mouth in the morning. 09/15/23   Jodie Lavern LITTIE, MD  Lutein  20 MG CAPS TAKE 1 CAPSULE BY MOUTH EVERY DAY Patient taking differently: Take 20 mg by mouth in the morning. 11/08/23   Jodie Lavern LITTIE, MD  meclizine  (ANTIVERT ) 12.5  MG tablet Take 1 tablet (12.5 mg total) by mouth 2 (two) times daily as needed for dizziness. 06/18/24   [name], MD  metoprolol  succinate (TOPROL -XL) 25 MG 24 hr tablet TAKE 1 TABLET BY MOUTH EVERY DAY Patient taking differently: Take 25 mg by mouth in the morning. 12/08/23   [name], MD  omeprazole  (PRILOSEC) 20 MG capsule TAKE 1 CAPSULE BY MOUTH EVERY DAY BEFORE BREAKFAST 10/12/23   [name], MD  triamcinolone  cream (KENALOG ) 0.1 % Apply 1 Application topically 2 (two) times daily. For 2 weeks, then as needed 06/18/24   [name], MD    Allergies: Prednisone    Review of Systems  Updated  Vital Signs BP (!) 101/59   Pulse 69   Temp (!) 97.5 F (36.4 C) (Oral)   Resp 18   Wt 83.5 kg   LMP  (LMP Unknown)   SpO2 100%   BMI 34.77 kg/m   Physical Exam Exam conducted with a chaperone present.  Constitutional:      Appearance: Normal appearance.  HENT:     Mouth/Throat:     Mouth: Mucous membranes are moist.     Pharynx: Oropharynx is clear.  Eyes:     Extraocular Movements: Extraocular movements intact.     Pupils: Pupils are equal, round, and reactive to light.  Cardiovascular:     Rate and Rhythm: Normal rate and regular rhythm.  Pulmonary:     Effort: Pulmonary effort is normal.     Breath sounds: Normal breath sounds.  Abdominal:     General: Abdomen is flat. Bowel sounds are normal.     Palpations: Abdomen is soft.  Genitourinary:    General: Normal vulva.     Vagina: Vaginal discharge present.  Musculoskeletal:        General: Normal range of motion.     Cervical back: Normal range of motion and neck supple.     Right lower leg: No edema.     Left lower leg: No edema.  Skin:    General: Skin is warm and dry.  Neurological:     Mental Status: She is alert.     (all labs ordered are listed, but only abnormal results are displayed) Labs Reviewed  BASIC METABOLIC PANEL WITH GFR - Abnormal; Notable for the following components:      Result Value   Sodium 133 (*)    Chloride 93 (*)    Glucose, Bld 137 (*)    BUN 75 (*)    Creatinine, Ser 1.92 (*)    Calcium  8.2 (*)    GFR, Estimated 25 (*)    All other components within normal limits  CBC WITH DIFFERENTIAL/PLATELET - Abnormal; Notable for the following components:   RBC 3.73 (*)    Hemoglobin 9.5 (*)    HCT 30.4 (*)    MCH 25.5 (*)    RDW 17.6 (*)    Abs Immature Granulocytes 0.08 (*)    All other components within normal limits  WET PREP, GENITAL  URINALYSIS, ROUTINE W REFLEX MICROSCOPIC    EKG: None  Radiology: No results found.   Procedures   Medications Ordered in the ED   metroNIDAZOLE  (FLAGYL ) tablet 500 mg (500 mg Oral Given 07/17/24 2108)  HYDROcodone -acetaminophen  (NORCO/VICODIN) 5-325 MG per tablet 1 tablet (1 tablet Oral Given 07/17/24 1912)  sodium chloride  0.9 % bolus 500 mL (500 mLs Intravenous New Bag/Given 07/17/24 2111)    Clinical Course as of 07/17/24 2126  Tue Jul 17, 2024  2109 Hemoglobin(!): 9.5 [SM]  2116 Creatinine(!): 1.92 [SM]    Clinical Course User Index [SM] Cleotilde Lukes, DO                                 Medical Decision Making This is a pleasant 83 year old patient with a PMH of recurrent UTIs with indwelling Foley catheter presenting with altered mental status since this morning.  At time of presentation her Foley catheter had been in place for greater than 28 days and had not yet been exchanged.  Based on her symptoms differential included urinary tract infection, vaginal infection with yeast or bacteria, bladder irritation, kidney stone.  CBC and BMP collected as well as UA with reflex to culture and wet prep.  CBC unremarkable, however BMP shows acute kidney injury.  Patient unable to make enough urine after Foley exchange to collect adequate sample for culture.  Given AKI and underlying dementia as well as uncertain source of infection, will admit for further workup and fluid resuscitation.  Amount and/or Complexity of Data Reviewed Labs: ordered. Decision-making details documented in ED Course.  Risk Prescription drug management. Decision regarding hospitalization.    Final diagnoses:  Transient alteration of awareness  AKI (acute kidney injury)    ED Discharge Orders     None          Cleotilde Lukes, DO 07/17/24 2134    Darra Fonda MATSU, MD 07/21/24 [phone]

## 2024-07-17 NOTE — ED Triage Notes (Signed)
 BIB [facility] from home for increased confusion from baseline, onset noticed today, h/o dementia. Has had a foley catheter in place for 28 days. Lives at home with daughter. Pt/ daughter has mentioned pain with urination, and increased frequency. Unsure of UTI. VSS: 122/6495% RA, HR 94, RR 20, CBG 225. Pt alert, NAD, calm, interactive, resps e/u, speaking in clearly, answering questions, skin W&D.

## 2024-07-18 DIAGNOSIS — N179 Acute kidney failure, unspecified: Secondary | ICD-10-CM | POA: Diagnosis not present

## 2024-07-18 LAB — MAGNESIUM: Magnesium: 3.4 mg/dL — ABNORMAL HIGH (ref 1.7–2.4)

## 2024-07-18 LAB — CBC WITH DIFFERENTIAL/PLATELET
Abs Immature Granulocytes: 0.07 K/uL (ref 0.00–0.07)
Basophils Absolute: 0 K/uL (ref 0.0–0.1)
Basophils Relative: 0 %
Eosinophils Absolute: 0.1 K/uL (ref 0.0–0.5)
Eosinophils Relative: 1 %
HCT: 29.3 % — ABNORMAL LOW (ref 36.0–46.0)
Hemoglobin: 9.2 g/dL — ABNORMAL LOW (ref 12.0–15.0)
Immature Granulocytes: 1 %
Lymphocytes Relative: 23 %
Lymphs Abs: 2.2 K/uL (ref 0.7–4.0)
MCH: 25.4 pg — ABNORMAL LOW (ref 26.0–34.0)
MCHC: 31.4 g/dL (ref 30.0–36.0)
MCV: 80.9 fL (ref 80.0–100.0)
Monocytes Absolute: 0.8 K/uL (ref 0.1–1.0)
Monocytes Relative: 9 %
Neutro Abs: 6.4 K/uL (ref 1.7–7.7)
Neutrophils Relative %: 66 %
Platelets: 373 K/uL (ref 150–400)
RBC: 3.62 MIL/uL — ABNORMAL LOW (ref 3.87–5.11)
RDW: 17.7 % — ABNORMAL HIGH (ref 11.5–15.5)
WBC: 9.6 K/uL (ref 4.0–10.5)
nRBC: 0 % (ref 0.0–0.2)

## 2024-07-18 LAB — PROTIME-INR
INR: 1.1 (ref 0.8–1.2)
Prothrombin Time: 14.5 s (ref 11.4–15.2)

## 2024-07-18 LAB — WET PREP, GENITAL
Clue Cells Wet Prep HPF POC: NONE SEEN
Sperm: NONE SEEN
Trich, Wet Prep: NONE SEEN
WBC, Wet Prep HPF POC: >=10 — AB (ref <10)
Yeast Wet Prep HPF POC: NONE SEEN

## 2024-07-18 LAB — COMPREHENSIVE METABOLIC PANEL WITH GFR
ALT: 15 U/L (ref 0–44)
AST: 15 U/L (ref 15–41)
Albumin: 2.4 g/dL — ABNORMAL LOW (ref 3.5–5.0)
Alkaline Phosphatase: 63 U/L (ref 38–126)
Anion gap: 14 (ref 5–15)
BUN: 68 mg/dL — ABNORMAL HIGH (ref 8–23)
CO2: 26 mmol/L (ref 22–32)
Calcium: 8.5 mg/dL — ABNORMAL LOW (ref 8.9–10.3)
Chloride: 95 mmol/L — ABNORMAL LOW (ref 98–111)
Creatinine, Ser: 1.78 mg/dL — ABNORMAL HIGH (ref 0.44–1.00)
GFR, Estimated: 28 mL/min — ABNORMAL LOW (ref >60)
Glucose, Bld: 94 mg/dL (ref 70–99)
Potassium: 4.4 mmol/L (ref 3.5–5.1)
Sodium: 135 mmol/L (ref 135–145)
Total Bilirubin: 0.9 mg/dL (ref 0.0–1.2)
Total Protein: 6.1 g/dL — ABNORMAL LOW (ref 6.5–8.1)

## 2024-07-18 LAB — RAPID URINE DRUG SCREEN, HOSP PERFORMED
Amphetamines: NOT DETECTED
Barbiturates: NOT DETECTED
Benzodiazepines: NOT DETECTED
Cocaine: NOT DETECTED
Opiates: POSITIVE — AB
Tetrahydrocannabinol: NOT DETECTED

## 2024-07-18 LAB — CBG MONITORING, ED
Glucose-Capillary: 91 mg/dL (ref 70–99)
Glucose-Capillary: 95 mg/dL (ref 70–99)
Glucose-Capillary: 92 mg/dL (ref 70–99)

## 2024-07-18 LAB — CK: Total CK: 54 U/L (ref 38–234)

## 2024-07-18 LAB — AMMONIA: Ammonia: <13 umol/L (ref 9–35)

## 2024-07-18 LAB — HEMOGLOBIN A1C
Hgb A1c MFr Bld: 6.2 % — ABNORMAL HIGH (ref 4.8–5.6)
Mean Plasma Glucose: 131.24 mg/dL

## 2024-07-18 LAB — T4, FREE: Free T4: 1.19 ng/dL — ABNORMAL HIGH (ref 0.61–1.12)

## 2024-07-18 LAB — VITAMIN B12: Vitamin B-12: >4000 pg/mL — ABNORMAL HIGH (ref 180–914)

## 2024-07-18 LAB — TSH: TSH: 4.483 u[IU]/mL (ref 0.350–4.500)

## 2024-07-18 LAB — GLUCOSE, CAPILLARY: Glucose-Capillary: 108 mg/dL — ABNORMAL HIGH (ref 70–99)

## 2024-07-18 MED ORDER — INFLUENZA VAC SPLIT HIGH-DOSE 0.5 ML IM SUSY
0.5000 mL | PREFILLED_SYRINGE | INTRAMUSCULAR | Status: DC
  Filled 2024-07-18: qty 0.5

## 2024-07-18 MED ORDER — LACTATED RINGERS IV SOLN: INTRAVENOUS | Status: AC

## 2024-07-18 NOTE — Progress Notes (Signed)
 PROGRESS NOTE    YUSRA RAVERT  FMW:[id number] DOB: Jul 13, 1941 DOA: 07/17/2024 PCP: Jodie Lavern LITTIE, MD    Brief Narrative:  83 year old female with PMHx of dementia, complete heart block status post PPM ([employer], 07/2015), T2DM, HTN, HLD, chronic pain syndrome on chronic opiate therapy, chronic indwelling Foley catheter, ACD, who presented to [facility] ED from home on 07/17/2024 due to increased urinary frequency, dysuria, vaginal discharge, decreased PO intake, and altered mental status, found to have an AKI.    In the ED, vital stable (BP initially low normal but improved).  Creatinine 1.92, BUN 75.  CBC with mild anemia (Hgb 9.5).  UA showed cloudy urine, 30 protein, large leukocytes, >50 WBC, rare bacteria.  Foley catheter exchanged.  Received NS 500 mL, Flagyl 500 mg p.o., and Norco.  Admitted for AKI, concern for acute cystitis, possible bacterial vaginosis, and acute metabolic encephalopathy superimposed on dementia.  Assessment and Plan:  #AKI - Likely prerenal from dehydration and poor oral intake as well as outpatient Lasix  and losartan  use - UA showed hyaline casts supporting dehydration - Creatinine 1.92 (baseline ~ 1.00), BUN/creatinine 39 - Foley exchanged in the ED, no obstruction identified - Creatinine improved to 1.78 today - Continue gentle IV hydration - Hold home losartan  and Lasix  - Strict I's and O's  # Acute cystitis  # Chronic indwelling foley catheter - Presenting with suprapubic discomfort and increased urinary frequency - UA positive for LE and pyuria - Foley exchanged in the ED - No SIRS criteria, afebrile, no leukocytosis - Continue Rocephin  - Urine culture pending  #Suspected bacterial vaginosis - New vaginal discharge, recently completed course of Diflucan - Received Flagyl in the ED - Wet prep ordered, pending collection  - Continue Flagyl for now  # Acute metabolic encephalopathy on dementia - Per patient's daughter, patient with 2 days of  confusion likely multifactorial in the setting of AKI, dehydration, infectious processes as above, superimposed on dementia and chronic opioids - No focal neurologic deficits - Delirium and fall precautions - Treatment of underlying conditions as above (AKI, UTI, BV) - Continue PRN norco in favor of home scheduled dose - Ammonia wnl - TSH just under the ULN at 4.48 - CK level, Vit B12, UDS pending  # Hypermagnesemia  - Mg mildly elevated to 3.4 in the setting of underlying AKI - Currently asymptomatic without any arrhythmias, hypotension, respiratory depression, or depressed reflexes - Continue IVFs as above - Continue to monitor  # T2DM - Hgb A1c 6.9, repeat pending - Diet controlled - ACHS - Low dose SSI  # Hypertension - BP low-normal on arrival - Home regimen includes amlodipine , losartan , Lasix , metoprolol  - Will hold amlodipine , losartan , Lasix  in the setting of low normal BPs and AKI - Continue metoprolol   #HLD - Continue home atorvastatin   #Chronic anemia - Hgb stable - Continue home PO iron  DVT prophylaxis: SCDs Start: 07/17/24 2212   Code Status:   Code Status: Full Code  Family Communication: Attempted to call patient's daughter, Ms. [name] [phone], twice.  Called patient on name identified voicemail, unable to verify identity, no message left.  Disposition Plan: Pending clinical improvement PT -   OT -    DME Needs:       Level of care: Progressive  Consultants:  None  Procedures:  None  Antimicrobials: IV Rocephin  P.o. Flagyl   Subjective: Patient evaluated at bedside.  Awake and alert but only oriented to person.  Did not answer most questions but did  say yes to lower abdominal pain and burning on urination.  Wheelchair appears untouched.  Patient states she cannot eat but cannot explain why.  Objective: Vitals:   07/18/24 0500 07/18/24 0529 07/18/24 0530 07/18/24 0700  BP: (!) 97/44  (!) 110/51 (!) 106/56  Pulse: 66 69 71 73   Resp: 10 15 20 15   Temp:  98.2 F (36.8 C)    TempSrc:      SpO2: 100% 100% 100% 100%  Weight:       No intake or output data in the 24 hours ending 07/18/24 0909 Filed Weights   07/17/24 1731  Weight: 83.5 kg    Examination:  General exam: NAD Respiratory system: No increased WOB. CTAB. Cardiovascular system: +S1/S2, RRR. No JVD or murmurs. No pedal edema. Gastrointestinal system: Soft, NTND. No masses felt. Normal bowel sounds. Central nervous system: Alert, oriented to person only. No focal neurological deficits. Extremities: 5/5 strength symmetrically Skin: No rashes, lesions or ulcers Psychiatry: Judgement and insight appear limited. Mood & affect appropriate.     Data Reviewed: I have personally reviewed following labs and imaging studies  CBC: Recent Labs  Lab 07/17/24 2047 07/18/24 0637  WBC 10.4 9.6  NEUTROABS 7.6 6.4  HGB 9.5* 9.2*  HCT 30.4* 29.3*  MCV 81.5 80.9  PLT 392 373   Basic Metabolic Panel: Recent Labs  Lab 07/17/24 2047 07/18/24 0637  NA 133* 135  K 4.8 4.4  CL 93* 95*  CO2 29 26  GLUCOSE 137* 94  BUN 75* 68*  CREATININE 1.92* 1.78*  CALCIUM  8.2* 8.5*  MG  --  3.4*   GFR: Estimated Creatinine Clearance: 23.1 mL/min (A) (by C-G formula based on SCr of 1.78 mg/dL (H)). Liver Function Tests: Recent Labs  Lab 07/18/24 0637  AST 15  ALT 15  ALKPHOS 63  BILITOT 0.9  PROT 6.1*  ALBUMIN 2.4*   No results for input(s): LIPASE, AMYLASE in the last 168 hours. Recent Labs  Lab 07/18/24 0637  AMMONIA <13   Coagulation Profile: No results for input(s): INR, PROTIME in the last 168 hours. Cardiac Enzymes: No results for input(s): CKTOTAL, CKMB, CKMBINDEX, TROPONINI in the last 168 hours. BNP (last 3 results) Recent Labs    03/08/24 1117 06/09/24 1825 06/10/24 0422  PROBNP 76.0 2,292.0* 1,632.0*   HbA1C: No results for input(s): HGBA1C in the last 72 hours. CBG: Recent Labs  Lab 07/18/24 0801  GLUCAP  91   Lipid Profile: No results for input(s): CHOL, HDL, LDLCALC, TRIG, CHOLHDL, LDLDIRECT in the last 72 hours. Thyroid  Function Tests: Recent Labs    07/18/24 0637  TSH 4.483   Anemia Panel: No results for input(s): VITAMINB12, FOLATE, FERRITIN, TIBC, IRON, RETICCTPCT in the last 72 hours. Sepsis Labs: No results for input(s): PROCALCITON, LATICACIDVEN in the last 168 hours.  No results found for this or any previous visit (from the past 240 hours).   Radiology Studies: No results found.  Scheduled Meds:  aspirin   81 mg Oral Daily   atorvastatin   40 mg Oral QHS   ferrous sulfate   325 mg Oral BID WC   insulin  aspart  0-6 Units Subcutaneous TID WC   metoprolol  succinate  25 mg Oral Daily   metroNIDAZOLE  500 mg Oral Q12H   Continuous Infusions:  cefTRIAXone  (ROCEPHIN )  IV Stopped (07/18/24 0050)     LOS:  LOS: 1 day   Time Spent: 40 minutes  Unresulted Labs (From admission, onward)     Start  Ordered   07/18/24 0647  Wet prep, genital  Once,   R        07/18/24 0647   07/18/24 0647  Protime-INR  Once,   R        07/18/24 0647   07/17/24 2258  Hemoglobin A1c  Add-on,   AD       Comments: To assess prior glycemic control    07/17/24 2257   07/17/24 2256  Sodium, urine, random  Add-on,   AD        07/17/24 2255   07/17/24 2256  Creatinine, urine, random  Add-on,   AD        07/17/24 2255   07/17/24 2256  CK  Add-on,   AD        07/17/24 2255   07/17/24 2256  Vitamin B12  Add-on,   AD        07/17/24 2256   07/17/24 2256  Rapid urine drug screen (hospital performed)  Add-on,   AD        07/17/24 2256   07/17/24 2210  Magnesium   Add-on,   AD        07/17/24 2209   07/17/24 2200  Urine Culture  Add-on,   AD       Question:  Indication  Answer:  Bacteriuria screening (OB/GYN or Uro)   07/17/24 2159             Duffy Larch, MD [facility]  If 7PM-7AM, please contact night-coverage  07/18/2024, 9:09 AM

## 2024-07-18 NOTE — ED Notes (Signed)
 CCMD called by this RN

## 2024-07-18 NOTE — ED Notes (Addendum)
 Phlb attempted to collect morning labs unable to collect.

## 2024-07-18 NOTE — Progress Notes (Signed)
-----------------------------------------------------------  [facility] --------------------------------------------------------D(Data) A(Action) R(response) Note------------------------------------------------  Patient Name: Krista Clarke Patient DOB: Nov 16, 1940 Date: @TODAY @      Data: 83 yo female, PMH, dementia, T2DM, HTN, HLD, VS, labs improving.     Action: Requested downgrade.      Response:  PT downgrade to telemetry per attending MD.  Appreciate the help.      Sharolyn Batman, RN [facility]

## 2024-07-19 DIAGNOSIS — N179 Acute kidney failure, unspecified: Secondary | ICD-10-CM | POA: Diagnosis not present

## 2024-07-19 LAB — GLUCOSE, CAPILLARY
Glucose-Capillary: 123 mg/dL — ABNORMAL HIGH (ref 70–99)
Glucose-Capillary: 156 mg/dL — ABNORMAL HIGH (ref 70–99)
Glucose-Capillary: 190 mg/dL — ABNORMAL HIGH (ref 70–99)
Glucose-Capillary: 222 mg/dL — ABNORMAL HIGH (ref 70–99)

## 2024-07-19 LAB — CBC
HCT: 30.8 % — ABNORMAL LOW (ref 36.0–46.0)
Hemoglobin: 9.6 g/dL — ABNORMAL LOW (ref 12.0–15.0)
MCH: 25.1 pg — ABNORMAL LOW (ref 26.0–34.0)
MCHC: 31.2 g/dL (ref 30.0–36.0)
MCV: 80.6 fL (ref 80.0–100.0)
Platelets: 378 K/uL (ref 150–400)
RBC: 3.82 MIL/uL — ABNORMAL LOW (ref 3.87–5.11)
RDW: 17.7 % — ABNORMAL HIGH (ref 11.5–15.5)
WBC: 8.4 K/uL (ref 4.0–10.5)
nRBC: 0 % (ref 0.0–0.2)

## 2024-07-19 LAB — BASIC METABOLIC PANEL WITH GFR
Anion gap: 11 (ref 5–15)
BUN: 53 mg/dL — ABNORMAL HIGH (ref 8–23)
CO2: 28 mmol/L (ref 22–32)
Calcium: 8.3 mg/dL — ABNORMAL LOW (ref 8.9–10.3)
Chloride: 96 mmol/L — ABNORMAL LOW (ref 98–111)
Creatinine, Ser: 1.38 mg/dL — ABNORMAL HIGH (ref 0.44–1.00)
GFR, Estimated: 38 mL/min — ABNORMAL LOW (ref >60)
Glucose, Bld: 120 mg/dL — ABNORMAL HIGH (ref 70–99)
Potassium: 4.2 mmol/L (ref 3.5–5.1)
Sodium: 135 mmol/L (ref 135–145)

## 2024-07-19 LAB — URINE CULTURE

## 2024-07-19 LAB — T4, FREE: Free T4: 1.17 ng/dL — ABNORMAL HIGH (ref 0.61–1.12)

## 2024-07-19 LAB — TSH: TSH: 4.236 u[IU]/mL (ref 0.350–4.500)

## 2024-07-19 LAB — MAGNESIUM: Magnesium: 3.1 mg/dL — ABNORMAL HIGH (ref 1.7–2.4)

## 2024-07-19 MED ORDER — AMLODIPINE BESYLATE 10 MG PO TABS
10.0000 mg | ORAL_TABLET | Freq: Every day | ORAL | Status: DC
  Administered 2024-07-19 – 2024-07-22 (×4): 10 mg via ORAL
  Filled 2024-07-19 (×4): qty 1

## 2024-07-19 MED ORDER — ENSURE PLUS HIGH PROTEIN PO LIQD
237.0000 mL | Freq: Two times a day (BID) | ORAL | Status: DC
  Administered 2024-07-20 – 2024-07-24 (×8): 237 mL via ORAL

## 2024-07-19 NOTE — Progress Notes (Signed)
 PROGRESS NOTE     Patient Demographics:    Krista Clarke, is a 83 y.o. female, DOB - 06-16-1941, FMW:[id number]  Outpatient Primary MD for the patient is [name], MD    LOS - 2  Admit date - 07/17/2024    Chief Complaint  Patient presents with   Altered Mental Status       Brief Narrative (HPI from H&P)   83 year old female with PMHx of dementia, complete heart block status post PPM ([employer], 07/2015), T2DM, HTN, HLD, chronic pain syndrome on chronic opiate therapy, chronic indwelling Foley catheter, ACD, who presented to [facility] ED from home on 07/17/2024 due to increased urinary frequency, dysuria, vaginal discharge, decreased PO intake, and altered mental status, found to have an AKI.     Subjective:    Krista Clarke today has, No headache, No chest pain, No abdominal pain - No Nausea, No new weakness tingling or numbness, no SOB, vaginal discharge has improved.  No bleeding.   Assessment  & Plan :    #AKI - Likely prerenal from dehydration and poor oral intake as well as outpatient Lasix  and losartan  use - Improving with IV fluids continue hold offending medications which include ARB and Lasix .   # Acute cystitis  # Chronic indwelling foley catheter - Presenting with suprapubic discomfort and increased urinary frequency - UA positive for LE and pyuria - Foley exchanged in the ED - On Rocephin , much improved follow cultures   #Suspected bacterial vaginosis - New vaginal discharge, recently completed course of Diflucan - Received Flagyl in the ED, much improved no clue cells, complete 5-day course discussed with OB on-call on 07/19/2024.  Was discharged follow-up with PCP.    # Acute metabolic encephalopathy on  dementia - Per patient's daughter, patient with 2 days of confusion likely multifactorial in the setting of AKI, dehydration, infectious processes as above, superimposed on dementia and chronic opioids, no focal deficits, no headache, minimize narcotics and benzodiazepines, ammonia stable, much improved with supportive care continue to monitor.  Stable B12, repeating TSH and free T4.    # Hypermagnesemia  -  Mg mildly elevated to 3.4 in the setting of underlying AKI - Continue to monitor   # Hypertension - BP low-normal on arrival, blood pressure is improved will reintroduce Norvasc  continue metoprolol , she is also on ARB and Lasix  will monitor and adjust as needed.     #HLD - Continue home atorvastatin    #Chronic anemia - Hgb stable - Continue home PO iron, PCP to monitor outpatient and do age-appropriate workup as needed.    # T2DM - Diet controlled - ACHS,  Low dose SSI  Lab Results  Component Value Date   HGBA1C 6.2 (H) 07/18/2024   CBG (last 3)  Recent Labs    07/18/24 1605 07/18/24 2111 07/19/24 0752  GLUCAP 92 108* 123*         Condition - Extremely Guarded  Family Communication  :  called daughter [name] [phone] 07/19/2024 at 7:45 AM message left  Code Status :  Full  Consults : Discussed with OB on-call on 07/19/2024  PUD Prophylaxis :    Procedures  :            Disposition Plan  :    Status is: Inpatient  DVT Prophylaxis  :    SCDs Start: 07/17/24 2212    Lab Results  Component Value Date   PLT 378 07/19/2024    Diet :  Diet Order             Diet regular Room service appropriate? Yes; Fluid consistency: Thin  Diet effective now                    Inpatient Medications  Scheduled Meds:  aspirin   81 mg Oral Daily   atorvastatin   40 mg Oral QHS   feeding supplement  237 mL Oral BID BM   ferrous sulfate   325 mg Oral BID WC   Influenza vac split trivalent PF  0.5 mL Intramuscular Tomorrow-1000   insulin  aspart  0-6  Units Subcutaneous TID WC   metoprolol  succinate  25 mg Oral Daily   metroNIDAZOLE  500 mg Oral Q12H   Continuous Infusions:  cefTRIAXone  (ROCEPHIN )  IV 1 g (07/18/24 2158)   PRN Meds:.acetaminophen  **OR** acetaminophen , HYDROcodone -acetaminophen , melatonin, ondansetron  (ZOFRAN ) IV  Antibiotics  :    Anti-infectives (From admission, onward)    Start     Dose/Rate Route Frequency Ordered Stop   07/17/24 2300  cefTRIAXone  (ROCEPHIN ) 1 g in sodium chloride  0.9 % 100 mL IVPB        1 g 200 mL/hr over 30 Minutes Intravenous Daily at bedtime 07/17/24 2249     07/17/24 2200  metroNIDAZOLE (FLAGYL) tablet 500 mg        500 mg Oral Every 12 hours 07/17/24 2039           Objective:   Vitals:   07/18/24 2000 07/19/24 0000 07/19/24 0400 07/19/24 0749  BP: (!) 154/83 134/65 115/88 132/68  Pulse:  77 77 72  Resp:  11 15   Temp: 97.6 F (36.4 C) 97.7 F (36.5 C) 97.8 F (36.6 C) 98.2 F (36.8 C)  TempSrc: Oral Oral Oral Oral  SpO2:  93% 92%   Weight:      Height:        Wt Readings from Last 3 Encounters:  07/18/24 77.9 kg  06/09/24 83.5 kg  05/15/24 88.5 kg     Intake/Output Summary (Last 24 hours) at 07/19/2024 0752 Last data filed at 07/19/2024 0400 Gross per 24 hour  Intake  1103.57 ml  Output 1000 ml  Net 103.57 ml     Physical Exam  Awake Alert, No new F.N deficits, Normal affect [place].AT,PERRAL Supple Neck, No JVD,   Symmetrical Chest wall movement, Good air movement bilaterally, CTAB RRR,No Gallops,Rubs or new Murmurs,  +ve B.Sounds, Abd Soft, No tenderness,   No Cyanosis, Clubbing or edema       Data Review:    Recent Labs  Lab 07/17/24 2047 07/18/24 0637 07/19/24 0308  WBC 10.4 9.6 8.4  HGB 9.5* 9.2* 9.6*  HCT 30.4* 29.3* 30.8*  PLT 392 373 378  MCV 81.5 80.9 80.6  MCH 25.5* 25.4* 25.1*  MCHC 31.3 31.4 31.2  RDW 17.6* 17.7* 17.7*  LYMPHSABS 1.9 2.2  --   MONOABS 0.8 0.8  --   EOSABS 0.0 0.1  --   BASOSABS 0.0 0.0  --     Recent Labs   Lab 07/17/24 2047 07/18/24 0637 07/18/24 1844 07/19/24 0308  NA 133* 135  --  135  K 4.8 4.4  --  4.2  CL 93* 95*  --  96*  CO2 29 26  --  28  ANIONGAP 11 14  --  11  GLUCOSE 137* 94  --  120*  BUN 75* 68*  --  53*  CREATININE 1.92* 1.78*  --  1.38*  AST  --  15  --   --   ALT  --  15  --   --   ALKPHOS  --  63  --   --   BILITOT  --  0.9  --   --   ALBUMIN  --  2.4*  --   --   INR  --   --  1.1  --   TSH  --  4.483  --   --   HGBA1C  --  6.2*  --   --   AMMONIA  --  <13  --   --   MG  --  3.4*  --   --   CALCIUM  8.2* 8.5*  --  8.3*      Recent Labs  Lab 07/17/24 2047 07/18/24 0637 07/18/24 1844 07/19/24 0308  INR  --   --  1.1  --   TSH  --  4.483  --   --   HGBA1C  --  6.2*  --   --   AMMONIA  --  <13  --   --   MG  --  3.4*  --   --   CALCIUM  8.2* 8.5*  --  8.3*    --------------------------------------------------------------------------------------------------------------- Lab Results  Component Value Date   CHOL 121 03/08/2024   HDL 45.50 03/08/2024   LDLCALC 52 03/08/2024   TRIG 115.0 03/08/2024   CHOLHDL 3 03/08/2024    Lab Results  Component Value Date   HGBA1C 6.2 (H) 07/18/2024   Recent Labs    07/18/24 0637  TSH 4.483  FREET4 1.19*   Recent Labs    07/17/24 2047  VITAMINB12 >4,000*   ------------------------------------------------------------------------------------------------------------------ Cardiac Enzymes No results for input(s): CKMB, TROPONINI, MYOGLOBIN in the last 168 hours.  Invalid input(s): CK  Micro Results Recent Results (from the past 240 hours)  Wet prep, genital     Status: Abnormal   Collection Time: 07/18/24 10:11 AM  Result Value Ref Range Status   Yeast Wet Prep HPF POC NONE SEEN NONE SEEN Final   Trich, Wet Prep NONE SEEN NONE SEEN Final   Clue Cells Wet  Prep HPF POC NONE SEEN NONE SEEN Final   WBC, Wet Prep HPF POC >=10 (A) <10 Final   Sperm NONE SEEN  Final    Comment: Performed at [facility], [address], [place], [place] [address]    Radiology Report No results found.   Signature  -   Lavada Stank M.D on 07/19/2024 at 7:52 AM   -  To page go to [email or web address]

## 2024-07-20 DIAGNOSIS — N179 Acute kidney failure, unspecified: Secondary | ICD-10-CM | POA: Diagnosis not present

## 2024-07-20 LAB — CBC WITH DIFFERENTIAL/PLATELET
Abs Immature Granulocytes: 0.04 K/uL (ref 0.00–0.07)
Basophils Absolute: 0 K/uL (ref 0.0–0.1)
Basophils Relative: 0 %
Eosinophils Absolute: 0.1 K/uL (ref 0.0–0.5)
Eosinophils Relative: 1 %
HCT: 27.4 % — ABNORMAL LOW (ref 36.0–46.0)
Hemoglobin: 8.6 g/dL — ABNORMAL LOW (ref 12.0–15.0)
Immature Granulocytes: 1 %
Lymphocytes Relative: 22 %
Lymphs Abs: 1.6 K/uL (ref 0.7–4.0)
MCH: 25.3 pg — ABNORMAL LOW (ref 26.0–34.0)
MCHC: 31.4 g/dL (ref 30.0–36.0)
MCV: 80.6 fL (ref 80.0–100.0)
Monocytes Absolute: 0.7 K/uL (ref 0.1–1.0)
Monocytes Relative: 10 %
Neutro Abs: 4.9 K/uL (ref 1.7–7.7)
Neutrophils Relative %: 66 %
Platelets: 354 K/uL (ref 150–400)
RBC: 3.4 MIL/uL — ABNORMAL LOW (ref 3.87–5.11)
RDW: 17.9 % — ABNORMAL HIGH (ref 11.5–15.5)
WBC: 7.4 K/uL (ref 4.0–10.5)
nRBC: 0 % (ref 0.0–0.2)

## 2024-07-20 LAB — BASIC METABOLIC PANEL WITH GFR
Anion gap: 9 (ref 5–15)
BUN: 38 mg/dL — ABNORMAL HIGH (ref 8–23)
CO2: 27 mmol/L (ref 22–32)
Calcium: 8.2 mg/dL — ABNORMAL LOW (ref 8.9–10.3)
Chloride: 96 mmol/L — ABNORMAL LOW (ref 98–111)
Creatinine, Ser: 0.82 mg/dL (ref 0.44–1.00)
GFR, Estimated: >60 mL/min (ref >60)
Glucose, Bld: 161 mg/dL — ABNORMAL HIGH (ref 70–99)
Potassium: 4.1 mmol/L (ref 3.5–5.1)
Sodium: 132 mmol/L — ABNORMAL LOW (ref 135–145)

## 2024-07-20 LAB — MAGNESIUM: Magnesium: 2.7 mg/dL — ABNORMAL HIGH (ref 1.7–2.4)

## 2024-07-20 LAB — GLUCOSE, CAPILLARY
Glucose-Capillary: 118 mg/dL — ABNORMAL HIGH (ref 70–99)
Glucose-Capillary: 137 mg/dL — ABNORMAL HIGH (ref 70–99)
Glucose-Capillary: 134 mg/dL — ABNORMAL HIGH (ref 70–99)
Glucose-Capillary: 120 mg/dL — ABNORMAL HIGH (ref 70–99)

## 2024-07-20 MED ORDER — LORATADINE 10 MG PO TABS
10.0000 mg | ORAL_TABLET | Freq: Every day | ORAL | Status: DC
  Administered 2024-07-20 – 2024-07-24 (×5): 10 mg via ORAL
  Filled 2024-07-20 (×5): qty 1

## 2024-07-20 MED ORDER — FLUTICASONE PROPIONATE 50 MCG/ACT NA SUSP
2.0000 | Freq: Every day | NASAL | Status: DC
  Administered 2024-07-20 – 2024-07-24 (×5): 2 via NASAL
  Filled 2024-07-20: qty 16

## 2024-07-20 MED ORDER — LACTATED RINGERS IV SOLN: INTRAVENOUS | Status: DC

## 2024-07-20 MED ORDER — CHLORHEXIDINE GLUCONATE CLOTH 2 % EX PADS
6.0000 | MEDICATED_PAD | Freq: Every day | CUTANEOUS | Status: DC
  Administered 2024-07-20 – 2024-07-24 (×5): 6 via TOPICAL

## 2024-07-20 NOTE — Care Management Important Message (Signed)
 Important Message  Patient Details  Name: Krista Clarke MRN: [id number] Date of Birth: 11-28-1940   Important Message Given:  Yes - Medicare IM     Jennie Laneta Dragon 07/20/2024, 2:41 PM

## 2024-07-20 NOTE — TOC Transition Note (Signed)
 Transition of Care [facility]) - Discharge Note   Patient Details  Name: Krista Clarke MRN: [id number] Date of Birth: 01-Jan-1941  [facility] CM/SW Contact:  Corean JAYSON Canary, RN Phone Number: 07/20/2024, 1:07 PM   Clinical Narrative:    Patient will transition home tomorrow. HH orders in for PT and RN, Hedda ETTER Huxley) aware that patient will DC tomorrow.   No further needs identified   Final next level of care: Home w Home Health Services Barriers to Discharge: No Barriers Identified   Patient Goals and CMS Choice            Discharge Placement                       Discharge Plan and Services Additional resources added to the After Visit Summary for                                       Social Drivers of Health (SDOH) Interventions SDOH Screenings   Food Insecurity: No Food Insecurity (07/18/2024)  Housing: Low Risk  (07/18/2024)  Transportation Needs: No Transportation Needs (07/18/2024)  Utilities: Not At Risk (07/18/2024)  Depression (PHQ2-9): Low Risk  (07/04/2024)  Financial Resource Strain: Low Risk  (01/18/2023)  Physical Activity: Inactive (03/06/2021)  Social Connections: Socially Isolated (07/18/2024)  Stress: No Stress Concern Present (03/06/2021)  Tobacco Use: Low Risk  (07/17/2024)     Readmission Risk Interventions    06/12/2024    3:09 PM 02/21/2024   11:40 AM  Readmission Risk Prevention Plan  Transportation Screening Complete Complete  PCP or Specialist Appt within 5-7 Days  Complete  PCP or Specialist Appt within 3-5 Days Complete   Home Care Screening  Complete  Medication Review (RN CM)  Complete  Social Work Consult for Recovery Care Planning/Counseling Complete   Palliative Care Screening Not Applicable   Medication Review Oceanographer) Complete

## 2024-07-20 NOTE — Plan of Care (Signed)

## 2024-07-20 NOTE — Progress Notes (Signed)
 PROGRESS NOTE     Patient Demographics:    Krista Clarke, is a 83 y.o. female, DOB - 1941/01/11, FMW:[id number]  Outpatient Primary MD for the patient is [name], MD    LOS - 3  Admit date - 07/17/2024    Chief Complaint  Patient presents with   Altered Mental Status       Brief Narrative (HPI from H&P)   83 year old female with PMHx of dementia, complete heart block status post PPM ([employer], 07/2015), T2DM, HTN, HLD, chronic pain syndrome on chronic opiate therapy, chronic indwelling Foley catheter, ACD, who presented to [facility] ED from home on 07/17/2024 due to increased urinary frequency, dysuria, vaginal discharge, decreased PO intake, and altered mental status, found to have an AKI.     Subjective:   Patient in bed, appears comfortable, denies any headache, no fever, no chest pain or pressure, no shortness of breath , no abdominal pain. No focal weakness.  Chronic right ear pain which has been ongoing for few weeks   Assessment  & Plan :    #AKI - Likely prerenal from dehydration and poor oral intake as well as outpatient Lasix  and losartan  use - Improving with IV fluids continue hold offending medications which include ARB and Lasix .   # Acute cystitis  # Chronic indwelling foley catheter - Presenting with suprapubic discomfort and increased urinary frequency - UA positive for LE and pyuria - Foley exchanged in the ED - On Rocephin , cultures suggest poor collection, will empirically treat for 7 days.   #Suspected bacterial vaginosis - New vaginal discharge, recently completed course of Diflucan  - Received Flagyl  in the ED, much improved no clue cells, complete 5-day course discussed with OB on-call on  07/19/2024.  Was discharged follow-up with PCP.    # Acute metabolic encephalopathy on dementia - Per patient's daughter, patient with 2 days of confusion likely multifactorial in the setting of AKI, dehydration, infectious processes as above, superimposed on dementia and chronic opioids, no focal deficits, no headache, minimize narcotics and benzodiazepines, ammonia stable, much improved with supportive care continue to monitor.  Stable B12, stable TSH free T4 slightly elevated request PCP to recheck both in 4 to 6 weeks.    # Hypermagnesemia  - Mg mildly elevated to 3.4 in the setting of underlying AKI - Continue to monitor   # Hypertension - BP low-normal on arrival, blood pressure is improved will reintroduce Norvasc  continue metoprolol , she is also on ARB and Lasix  will monitor and adjust as needed.   Right ear discomfort.  Exam limited due to wax.  Likely eustachian tube congestion, Nasonex and Zyrtec added.  Monitor.    #HLD - Continue home atorvastatin    #Chronic anemia - Hgb stable - Continue home PO iron, PCP to monitor outpatient and do age-appropriate workup as needed.    # T2DM - Diet controlled - ACHS,  Low dose SSI  Lab Results  Component Value Date   HGBA1C 6.2 (H) 07/18/2024   CBG (last 3)  Recent Labs    07/19/24 1604 07/19/24 2123 07/20/24 0739  GLUCAP 190* 222* 118*         Condition - Extremely Guarded  Family Communication  :  called daughter [name] [phone] 07/19/2024 at 7:45 AM message left, called again 07/20/2024 at 8:48 AM and message left  Code Status :  Full  Consults : Discussed with OB on-call on 07/19/2024  PUD Prophylaxis :    Procedures  :            Disposition Plan  :    Status is: Inpatient  DVT Prophylaxis  :    SCDs Start: 07/17/24 2212    Lab Results  Component Value Date   PLT 354 07/20/2024    Diet :  Diet Order             Diet regular Room service appropriate? Yes; Fluid consistency: Thin  Diet  effective now                    Inpatient Medications  Scheduled Meds:  amLODipine   10 mg Oral Daily   aspirin   81 mg Oral Daily   atorvastatin   40 mg Oral QHS   feeding supplement  237 mL Oral BID BM   ferrous sulfate   325 mg Oral BID WC   fluticasone   2 spray Each Nare Daily   Influenza vac split trivalent PF  0.5 mL Intramuscular Tomorrow-1000   insulin  aspart  0-6 Units Subcutaneous TID WC   loratadine   10 mg Oral Daily   metoprolol  succinate  25 mg Oral Daily   metroNIDAZOLE   500 mg Oral Q12H   Continuous Infusions:  cefTRIAXone  (ROCEPHIN )  IV 1 g (07/19/24 2124)   lactated ringers      PRN Meds:.acetaminophen  **OR** acetaminophen , HYDROcodone -acetaminophen , melatonin, ondansetron  (ZOFRAN ) IV  Antibiotics  :    Anti-infectives (From admission, onward)    Start     Dose/Rate Route Frequency Ordered Stop   07/17/24 2300  cefTRIAXone  (ROCEPHIN ) 1 g in sodium chloride  0.9 % 100 mL IVPB        1 g 200 mL/hr over 30 Minutes Intravenous Daily at bedtime 07/17/24 2249     07/17/24 2200  metroNIDAZOLE  (FLAGYL ) tablet 500 mg        500 mg Oral Every 12 hours 07/17/24 2039           Objective:   Vitals:   07/19/24 1932 07/20/24 0116 07/20/24 0400 07/20/24 0740  BP:  (!) 126/54 (!) 128/56 131/64  Pulse: 75  65 64  Resp: 14 20 20  12  Temp:  98.5 F (36.9 C) 98.2 F (36.8 C) 98.2 F (36.8 C)  TempSrc:  Oral Oral Oral  SpO2: 99%  97% 100%  Weight:   80.5 kg   Height:        Wt Readings from Last 3 Encounters:  07/20/24 80.5 kg  06/09/24 83.5 kg  05/15/24 88.5 kg     Intake/Output Summary (Last 24 hours) at 07/20/2024 0847 Last data filed at 07/20/2024 0100 Gross per 24 hour  Intake 100 ml  Output 1450 ml  Net -1350 ml     Physical Exam  Awake Alert, No new F.N deficits, Normal affect [place].AT,PERRAL, right ear mild wax in the canal, partially visible tympanic membrane appears stable Supple Neck, No JVD,   Symmetrical Chest wall movement, Good air  movement bilaterally, CTAB RRR,No Gallops,Rubs or new Murmurs,  +ve B.Sounds, Abd Soft, No tenderness,   No Cyanosis, Clubbing or edema       Data Review:    Recent Labs  Lab 07/17/24 2047 07/18/24 0637 07/19/24 0308 07/20/24 0318  WBC 10.4 9.6 8.4 7.4  HGB 9.5* 9.2* 9.6* 8.6*  HCT 30.4* 29.3* 30.8* 27.4*  PLT 392 373 378 354  MCV 81.5 80.9 80.6 80.6  MCH 25.5* 25.4* 25.1* 25.3*  MCHC 31.3 31.4 31.2 31.4  RDW 17.6* 17.7* 17.7* 17.9*  LYMPHSABS 1.9 2.2  --  1.6  MONOABS 0.8 0.8  --  0.7  EOSABS 0.0 0.1  --  0.1  BASOSABS 0.0 0.0  --  0.0    Recent Labs  Lab 07/17/24 2047 07/18/24 0637 07/18/24 1844 07/19/24 0308 07/20/24 0318  NA 133* 135  --  135 132*  K 4.8 4.4  --  4.2 4.1  CL 93* 95*  --  96* 96*  CO2 29 26  --  28 27  ANIONGAP 11 14  --  11 9  GLUCOSE 137* 94  --  120* 161*  BUN 75* 68*  --  53* 38*  CREATININE 1.92* 1.78*  --  1.38* 0.82  AST  --  15  --   --   --   ALT  --  15  --   --   --   ALKPHOS  --  63  --   --   --   BILITOT  --  0.9  --   --   --   ALBUMIN  --  2.4*  --   --   --   INR  --   --  1.1  --   --   TSH  --  4.483  --  4.236  --   HGBA1C  --  6.2*  --   --   --   AMMONIA  --  <13  --   --   --   MG  --  3.4*  --  3.1* 2.7*  CALCIUM  8.2* 8.5*  --  8.3* 8.2*      Recent Labs  Lab 07/17/24 2047 07/18/24 0637 07/18/24 1844 07/19/24 0308 07/20/24 0318  INR  --   --  1.1  --   --   TSH  --  4.483  --  4.236  --   HGBA1C  --  6.2*  --   --   --   AMMONIA  --  <13  --   --   --   MG  --  3.4*  --  3.1* 2.7*  CALCIUM  8.2* 8.5*  --  8.3* 8.2*    ---------------------------------------------------------------------------------------------------------------  Lab Results  Component Value Date   CHOL 121 03/08/2024   HDL 45.50 03/08/2024   LDLCALC 52 03/08/2024   TRIG 115.0 03/08/2024   CHOLHDL 3 03/08/2024    Lab Results  Component Value Date   HGBA1C 6.2 (H) 07/18/2024   Recent Labs    07/19/24 0308  TSH 4.236   FREET4 1.17*   Recent Labs    07/17/24 2047  VITAMINB12 >4,000*   ------------------------------------------------------------------------------------------------------------------ Cardiac Enzymes No results for input(s): CKMB, TROPONINI, MYOGLOBIN in the last 168 hours.  Invalid input(s): CK  Micro Results Recent Results (from the past 240 hours)  Urine Culture     Status: Abnormal   Collection Time: 07/17/24 10:00 PM   Specimen: Urine, Catheterized  Result Value Ref Range Status   Specimen Description URINE, CATHETERIZED  Final   Special Requests   Final    NONE Performed at [facility], [address], [place], [place] [address]    Culture MULTIPLE SPECIES PRESENT, SUGGEST RECOLLECTION (A)  Final   Report Status 07/19/2024 FINAL  Final  Wet prep, genital     Status: Abnormal   Collection Time: 07/18/24 10:11 AM  Result Value Ref Range Status   Yeast Wet Prep HPF POC NONE SEEN NONE SEEN Final   Trich, Wet Prep NONE SEEN NONE SEEN Final   Clue Cells Wet Prep HPF POC NONE SEEN NONE SEEN Final   WBC, Wet Prep HPF POC >=10 (A) <10 Final   Sperm NONE SEEN  Final    Comment: Performed at [facility], [address] [place], [place] [address]    Radiology Report No results found.   Signature  -   Lavada Stank M.D on 07/20/2024 at 8:47 AM   -  To page go to [email or web address]

## 2024-07-21 DIAGNOSIS — N179 Acute kidney failure, unspecified: Secondary | ICD-10-CM | POA: Diagnosis not present

## 2024-07-21 LAB — GLUCOSE, CAPILLARY
Glucose-Capillary: 113 mg/dL — ABNORMAL HIGH (ref 70–99)
Glucose-Capillary: 182 mg/dL — ABNORMAL HIGH (ref 70–99)
Glucose-Capillary: 214 mg/dL — ABNORMAL HIGH (ref 70–99)
Glucose-Capillary: 183 mg/dL — ABNORMAL HIGH (ref 70–99)

## 2024-07-21 LAB — BASIC METABOLIC PANEL WITH GFR
Anion gap: 13 (ref 5–15)
BUN: 25 mg/dL — ABNORMAL HIGH (ref 8–23)
CO2: 25 mmol/L (ref 22–32)
Calcium: 8.4 mg/dL — ABNORMAL LOW (ref 8.9–10.3)
Chloride: 99 mmol/L (ref 98–111)
Creatinine, Ser: 1.04 mg/dL — ABNORMAL HIGH (ref 0.44–1.00)
GFR, Estimated: 53 mL/min — ABNORMAL LOW (ref >60)
Glucose, Bld: 120 mg/dL — ABNORMAL HIGH (ref 70–99)
Potassium: 4.2 mmol/L (ref 3.5–5.1)
Sodium: 137 mmol/L (ref 135–145)

## 2024-07-21 LAB — MAGNESIUM: Magnesium: 2.3 mg/dL (ref 1.7–2.4)

## 2024-07-21 LAB — CBC WITH DIFFERENTIAL/PLATELET
Abs Immature Granulocytes: 0.05 K/uL (ref 0.00–0.07)
Basophils Absolute: 0 K/uL (ref 0.0–0.1)
Basophils Relative: 0 %
Eosinophils Absolute: 0.1 K/uL (ref 0.0–0.5)
Eosinophils Relative: 1 %
HCT: 28.7 % — ABNORMAL LOW (ref 36.0–46.0)
Hemoglobin: 9 g/dL — ABNORMAL LOW (ref 12.0–15.0)
Immature Granulocytes: 1 %
Lymphocytes Relative: 27 %
Lymphs Abs: 2.3 K/uL (ref 0.7–4.0)
MCH: 25.3 pg — ABNORMAL LOW (ref 26.0–34.0)
MCHC: 31.4 g/dL (ref 30.0–36.0)
MCV: 80.6 fL (ref 80.0–100.0)
Monocytes Absolute: 0.7 K/uL (ref 0.1–1.0)
Monocytes Relative: 9 %
Neutro Abs: 5.3 K/uL (ref 1.7–7.7)
Neutrophils Relative %: 62 %
Platelets: 405 K/uL — ABNORMAL HIGH (ref 150–400)
RBC: 3.56 MIL/uL — ABNORMAL LOW (ref 3.87–5.11)
RDW: 17.6 % — ABNORMAL HIGH (ref 11.5–15.5)
WBC: 8.5 K/uL (ref 4.0–10.5)
nRBC: 0 % (ref 0.0–0.2)

## 2024-07-21 MED ORDER — NYSTATIN 100000 UNIT/GM EX POWD
Freq: Three times a day (TID) | CUTANEOUS | Status: DC
  Filled 2024-07-21: qty 15

## 2024-07-21 NOTE — Progress Notes (Signed)
 PROGRESS NOTE     Patient Demographics:    Krista Clarke, is a 83 y.o. female, DOB - 1941/08/16, FMW:[id number]  Outpatient Primary MD for the patient is [name], MD    LOS - 4  Admit date - 07/17/2024    Chief Complaint  Patient presents with   Altered Mental Status       Brief Narrative (HPI from H&P)   83 year old female with PMHx of dementia, complete heart block status post PPM ([employer], 07/2015), T2DM, HTN, HLD, chronic pain syndrome on chronic opiate therapy, chronic indwelling Foley catheter, ACD, who presented to [facility] ED from home on 07/17/2024 due to increased urinary frequency, dysuria, vaginal discharge, decreased PO intake, and altered mental status, found to have an AKI.     Subjective:   Patient in bed, appears comfortable, denies any headache, no fever, no chest pain or pressure, no shortness of breath , no abdominal pain. No new focal weakness.  Right ear discomfort almost completely resolved.   Assessment  & Plan :    #AKI - Likely prerenal from dehydration and poor oral intake as well as outpatient Lasix  and losartan  use - Improving with IV fluids continue hold offending medications which include ARB and Lasix .   # Acute cystitis due to chronic indwelling Foley catheter present on admission. - Presenting with suprapubic discomfort and increased urinary frequency - UA positive for LE and pyuria - Foley exchanged in the ED - On Rocephin , cultures suggest poor collection, will empirically treat for 7 days.   #Suspected bacterial vaginosis - New vaginal discharge, recently completed course of Diflucan  - Received Flagyl  in the ED, much improved no clue cells, complete 5-day course discussed with  OB on-call on 07/19/2024.  Was discharged follow-up with PCP.    # Acute metabolic encephalopathy on dementia - Per patient's daughter, patient with 2 days of confusion likely multifactorial in the setting of AKI, dehydration, infectious processes as above, superimposed on dementia and chronic opioids, no focal deficits, no headache, minimize narcotics and benzodiazepines, ammonia stable, much improved with supportive care continue to monitor.  Stable B12, stable TSH free T4 slightly elevated request PCP to recheck both in 4 to 6 weeks.    # Hypermagnesemia  - Mg mildly elevated to 3.4 in the setting of underlying AKI - Continue to monitor   # Hypertension - BP low-normal on arrival, blood pressure is improved will reintroduce Norvasc  continue metoprolol , she is also on ARB and Lasix  will monitor and adjust as needed.   Right ear discomfort.  Exam limited due to wax.  Likely eustachian tube congestion, Nasonex and Zyrtec added.  Monitor.    #HLD - Continue home atorvastatin    #Chronic anemia - Hgb stable - Continue home PO iron, PCP to monitor outpatient and do age-appropriate workup as needed.    # T2DM - Diet controlled - ACHS,  Low dose SSI  Lab Results  Component Value Date   HGBA1C 6.2 (H) 07/18/2024   CBG (last 3)  Recent Labs    07/20/24 1652 07/20/24 2144 07/21/24 0747  GLUCAP 134* 120* 113*         Condition - Extremely Guarded  Family Communication  :  called daughter [name] [phone] 07/19/2024 at 7:45 AM message left, called again 07/20/2024 at 8:48 AM and message left, discussed with daughter bedside on 07/21/2024  Code Status :  Full  Consults : Discussed with OB on-call on 07/19/2024  PUD Prophylaxis :    Procedures  :            Disposition Plan  :    Status is: Inpatient  DVT Prophylaxis  :    SCDs Start: 07/17/24 2212    Lab Results  Component Value Date   PLT 405 (H) 07/21/2024    Diet :  Diet Order             Diet  regular Room service appropriate? Yes; Fluid consistency: Thin  Diet effective now                    Inpatient Medications  Scheduled Meds:  amLODipine   10 mg Oral Daily   aspirin   81 mg Oral Daily   atorvastatin   40 mg Oral QHS   Chlorhexidine  Gluconate Cloth  6 each Topical Daily   feeding supplement  237 mL Oral BID BM   ferrous sulfate   325 mg Oral BID WC   fluticasone   2 spray Each Nare Daily   Influenza vac split trivalent PF  0.5 mL Intramuscular Tomorrow-1000   insulin  aspart  0-6 Units Subcutaneous TID WC   loratadine   10 mg Oral Daily   metoprolol  succinate  25 mg Oral Daily   metroNIDAZOLE   500 mg Oral Q12H   Continuous Infusions:  cefTRIAXone  (ROCEPHIN )  IV Stopped (07/20/24 2140)   PRN Meds:.acetaminophen  **OR** acetaminophen , HYDROcodone -acetaminophen , melatonin, ondansetron  (ZOFRAN ) IV  Antibiotics  :    Anti-infectives (From admission, onward)    Start     Dose/Rate Route Frequency Ordered Stop   07/17/24 2300  cefTRIAXone  (ROCEPHIN ) 1 g in sodium chloride  0.9 % 100 mL IVPB        1 g 200 mL/hr over 30 Minutes Intravenous Daily at bedtime 07/17/24 2249     07/17/24 2200  metroNIDAZOLE  (FLAGYL ) tablet 500 mg        500 mg Oral Every 12 hours 07/17/24 2039           Objective:   Vitals:   07/20/24 2301 07/21/24 0356 07/21/24 0500 07/21/24 0742  BP: 136/73 134/62  138/75  Pulse: 90 82  79  Resp: 20 (!) 21  17  Temp: 97.7 F (36.5 C) 97.6 F (36.4 C)  99 F (37.2 C)  TempSrc: Oral Oral  Oral  SpO2: 97% 97%  97%  Weight:   82.4 kg   Height:        Wt Readings from Last 3 Encounters:  07/21/24 82.4 kg  06/09/24 83.5 kg  05/15/24 88.5 kg     Intake/Output Summary (Last 24 hours) at 07/21/2024 9062 Last data filed at 07/20/2024 2005 Gross per 24 hour  Intake --  Output 650 ml  Net -650 ml     Physical Exam  Awake Alert, No new F.N deficits, Normal affect [place].AT,PERRAL,  Supple Neck, No JVD,   Symmetrical Chest wall movement,  Good air movement bilaterally, CTAB RRR,No Gallops,Rubs or new Murmurs,  +ve B.Sounds, Abd Soft, No tenderness,   No Cyanosis, Clubbing or edema       Data Review:    Recent Labs  Lab 07/17/24 2047 07/18/24 0637 07/19/24 0308 07/20/24 0318 07/21/24 0411  WBC 10.4 9.6 8.4 7.4 8.5  HGB 9.5* 9.2* 9.6* 8.6* 9.0*  HCT 30.4* 29.3* 30.8* 27.4* 28.7*  PLT 392 373 378 354 405*  MCV 81.5 80.9 80.6 80.6 80.6  MCH 25.5* 25.4* 25.1* 25.3* 25.3*  MCHC 31.3 31.4 31.2 31.4 31.4  RDW 17.6* 17.7* 17.7* 17.9* 17.6*  LYMPHSABS 1.9 2.2  --  1.6 2.3  MONOABS 0.8 0.8  --  0.7 0.7  EOSABS 0.0 0.1  --  0.1 0.1  BASOSABS 0.0 0.0  --  0.0 0.0    Recent Labs  Lab 07/17/24 2047 07/18/24 0637 07/18/24 1844 07/19/24 0308 07/20/24 0318 07/21/24 0411  NA 133* 135  --  135 132* 137  K 4.8 4.4  --  4.2 4.1 4.2  CL 93* 95*  --  96* 96* 99  CO2 29 26  --  28 27 25   ANIONGAP 11 14  --  11 9 13   GLUCOSE 137* 94  --  120* 161* 120*  BUN 75* 68*  --  53* 38* 25*  CREATININE 1.92* 1.78*  --  1.38* 0.82 1.04*  AST  --  15  --   --   --   --   ALT  --  15  --   --   --   --   ALKPHOS  --  63  --   --   --   --   BILITOT  --  0.9  --   --   --   --   ALBUMIN  --  2.4*  --   --   --   --   INR  --   --  1.1  --   --   --   TSH  --  4.483  --  4.236  --   --   HGBA1C  --  6.2*  --   --   --   --   AMMONIA  --  <13  --   --   --   --   MG  --  3.4*  --  3.1* 2.7* 2.3  CALCIUM  8.2* 8.5*  --  8.3* 8.2* 8.4*      Recent Labs  Lab 07/17/24 2047 07/18/24 0637 07/18/24 1844 07/19/24 0308 07/20/24 0318 07/21/24 0411  INR  --   --  1.1  --   --   --   TSH  --  4.483  --  4.236  --   --   HGBA1C  --  6.2*  --   --   --   --   AMMONIA  --  <13  --   --   --   --   MG  --  3.4*  --  3.1* 2.7* 2.3  CALCIUM  8.2* 8.5*  --  8.3* 8.2* 8.4*    --------------------------------------------------------------------------------------------------------------- Lab Results  Component Value Date   CHOL 121  03/08/2024   HDL 45.50 03/08/2024   LDLCALC 52 03/08/2024   TRIG 115.0 03/08/2024   CHOLHDL 3 03/08/2024    Lab Results  Component Value Date   HGBA1C 6.2 (H) 07/18/2024   Recent Labs    07/19/24 0308  TSH 4.236  FREET4 1.17*   No results for input(s): VITAMINB12, FOLATE, FERRITIN, TIBC, IRON, RETICCTPCT in the last 72 hours.  ------------------------------------------------------------------------------------------------------------------ Cardiac Enzymes No results for input(s): CKMB, TROPONINI, MYOGLOBIN in the last 168 hours.  Invalid input(s): CK  Micro Results Recent Results (from the past 240 hours)  Urine Culture     Status: Abnormal   Collection Time: 07/17/24 10:00 PM   Specimen: Urine, Catheterized  Result Value Ref Range Status   Specimen Description URINE, CATHETERIZED  Final   Special Requests   Final    NONE Performed at [facility], [address] [place], [place] [address]    Culture MULTIPLE SPECIES PRESENT, SUGGEST RECOLLECTION (A)  Final   Report Status 07/19/2024 FINAL  Final  Wet prep, genital     Status: Abnormal   Collection Time: 07/18/24 10:11 AM  Result Value Ref Range Status   Yeast Wet Prep HPF POC NONE SEEN NONE SEEN Final   Trich, Wet Prep NONE SEEN NONE SEEN Final   Clue Cells Wet Prep HPF POC NONE SEEN NONE SEEN Final   WBC, Wet Prep HPF POC >=10 (A) <10 Final   Sperm NONE SEEN  Final    Comment: Performed at [facility], [address], [place], [place] [address]    Radiology Report No results found.   Signature  -   Lavada Stank M.D on 07/21/2024 at 9:37 AM   -  To page go to [email or web address]

## 2024-07-21 NOTE — Plan of Care (Signed)
  Problem: Health Behavior/Discharge Planning: Goal: Ability to manage health-related needs will improve Outcome: Progressing   Problem: Skin Integrity: Goal: Risk for impaired skin integrity will decrease Outcome: Progressing   Problem: Clinical Measurements: Goal: Will remain free from infection Outcome: Progressing   Problem: Nutrition: Goal: Adequate nutrition will be maintained Outcome: Progressing   Problem: Safety: Goal: Ability to remain free from injury will improve Outcome: Progressing

## 2024-07-21 NOTE — Care Management (Signed)
 LVM w daughter requested callback, per MD she has questions about DME.    Rice,Sharon (Daughter) [phone]

## 2024-07-21 NOTE — TOC Progression Note (Signed)
 Transition of Care [facility]) - Progression Note    Patient Details  Name: Krista Clarke MRN: [id number] Date of Birth: 14-Mar-1941  [facility] CM/SW Contact  Marval Gell, RN Phone Number: 07/21/2024, 10:08 AM  Clinical Narrative:     [name] w daughter she states that her PCP has already submitted orders for hospital bed through [employer] and when she last spoke to them they were still working on auth. She does not feel it is safe for the patient to DC home w/o bed in [facility] as she has fallen out of the bed several times.  Spoke w Valeria at [facility] she states the holdup is insurance auth. I informed Freddy that the delivery of the DME is delaying hospital DC. I requested an expedited processing of the orders, hospital bed, wheelchair and mattress. [facility] is submitting urgent request for expedited review with goal of delivery over the weekend.   I have updated [name], and she will call me back to let me know when equipment is delivered so we can DC patient   Expected Discharge Plan: Home w Home Health Services Barriers to Discharge: No Barriers Identified               Expected Discharge Plan and Services       Living arrangements for the past 2 months: Single Family Home                                       Social Drivers of Health (SDOH) Interventions SDOH Screenings   Food Insecurity: No Food Insecurity (07/18/2024)  Housing: Low Risk  (07/18/2024)  Transportation Needs: No Transportation Needs (07/18/2024)  Utilities: Not At Risk (07/18/2024)  Depression (PHQ2-9): Low Risk  (07/04/2024)  Financial Resource Strain: Low Risk  (01/18/2023)  Physical Activity: Inactive (03/06/2021)  Social Connections: Socially Isolated (07/18/2024)  Stress: No Stress Concern Present (03/06/2021)  Tobacco Use: Low Risk  (07/17/2024)    Readmission Risk Interventions    06/12/2024    3:09 PM 02/21/2024   11:40 AM  Readmission Risk Prevention Plan  Transportation Screening  Complete Complete  PCP or Specialist Appt within 5-7 Days  Complete  PCP or Specialist Appt within 3-5 Days Complete   Home Care Screening  Complete  Medication Review (RN CM)  Complete  Social Work Consult for Recovery Care Planning/Counseling Complete   Palliative Care Screening Not Applicable   Medication Review Oceanographer) Complete

## 2024-07-22 DIAGNOSIS — N179 Acute kidney failure, unspecified: Secondary | ICD-10-CM | POA: Diagnosis not present

## 2024-07-22 LAB — GLUCOSE, CAPILLARY
Glucose-Capillary: 138 mg/dL — ABNORMAL HIGH (ref 70–99)
Glucose-Capillary: 207 mg/dL — ABNORMAL HIGH (ref 70–99)
Glucose-Capillary: 277 mg/dL — ABNORMAL HIGH (ref 70–99)
Glucose-Capillary: 103 mg/dL — ABNORMAL HIGH (ref 70–99)

## 2024-07-22 MED ORDER — LACTATED RINGERS IV BOLUS
1000.0000 mL | Freq: Once | INTRAVENOUS | Status: AC
  Administered 2024-07-22: 1000 mL via INTRAVENOUS

## 2024-07-22 MED ORDER — METOPROLOL SUCCINATE ER 25 MG PO TB24: 12.5000 mg | ORAL_TABLET | Freq: Every day | ORAL | Status: DC

## 2024-07-22 NOTE — TOC Progression Note (Signed)
 Transition of Care [facility]) - Progression Note    Patient Details  Name: Krista Clarke MRN: [id number] Date of Birth: [birth year]  Transition of Care [facility]) CM/SW Contact  Marval Gell, RN Phone Number: 07/22/2024, 1:55 PM  Clinical Narrative:     [name] w daughter, she has not heard from [employer]. If she does tomorrow she will call the nurse's station to make us  aware.  RN CM please reach out to [employer] Monday to inquire about delivery of DME.   Expected Discharge Plan: Home w Home Health Services Barriers to Discharge: No Barriers Identified               Expected Discharge Plan and Services       Living arrangements for the past 2 months: Single Family Home                                       Social Drivers of Health (SDOH) Interventions SDOH Screenings   Food Insecurity: No Food Insecurity (07/18/2024)  Housing: Low Risk  (07/18/2024)  Transportation Needs: No Transportation Needs (07/18/2024)  Utilities: Not At Risk (07/18/2024)  Depression (PHQ2-9): Low Risk  (07/04/2024)  Financial Resource Strain: Low Risk  (01/18/2023)  Physical Activity: Inactive (03/06/2021)  Social Connections: Socially Isolated (07/18/2024)  Stress: No Stress Concern Present (03/06/2021)  Tobacco Use: Low Risk  (07/17/2024)    Readmission Risk Interventions    06/12/2024    3:09 PM 02/21/2024   11:40 AM  Readmission Risk Prevention Plan  Transportation Screening Complete Complete  PCP or Specialist Appt within 5-7 Days  Complete  PCP or Specialist Appt within 3-5 Days Complete   Home Care Screening  Complete  Medication Review (RN CM)  Complete  Social Work Consult for Recovery Care Planning/Counseling Complete   Palliative Care Screening Not Applicable   Medication Review Oceanographer) Complete

## 2024-07-22 NOTE — Plan of Care (Signed)
  Problem: Health Behavior/Discharge Planning: Goal: Ability to manage health-related needs will improve Outcome: Progressing   Problem: Skin Integrity: Goal: Risk for impaired skin integrity will decrease Outcome: Progressing   Problem: Health Behavior/Discharge Planning: Goal: Ability to manage health-related needs will improve Outcome: Progressing   Problem: Activity: Goal: Risk for activity intolerance will decrease Outcome: Progressing   Problem: Pain Managment: Goal: General experience of comfort will improve and/or be controlled Outcome: Progressing

## 2024-07-22 NOTE — Progress Notes (Signed)
 PROGRESS NOTE     Patient Demographics:    Krista Clarke, is a 83 y.o. female, DOB - [birth year], FMW:[id number]  Outpatient Primary MD for the patient is [name], MD    LOS - 5  Admit date - 07/17/2024    Chief Complaint  Patient presents with   Altered Mental Status       Brief Narrative (HPI from H&P)   83 year old female with PMHx of dementia, complete heart block status post PPM ([employer], 07/2015), T2DM, HTN, HLD, chronic pain syndrome on chronic opiate therapy, chronic indwelling Foley catheter, ACD, who presented to [facility] ED from home on 07/17/2024 due to increased urinary frequency, dysuria, vaginal discharge, decreased PO intake, and altered mental status, found to have an AKI.     Subjective:   Patient in bed, appears comfortable, denies any headache, no fever, no chest pain or pressure, no shortness of breath , no abdominal pain. No new focal weakness. Right ear discomfort almost completely resolved.   Assessment  & Plan :    #AKI - Likely prerenal from dehydration and poor oral intake as well as outpatient Lasix  and losartan  use - Improving with IV fluids continue hold offending medications which include ARB and Lasix .   # Acute cystitis due to chronic indwelling Foley catheter present on admission. - Presenting with suprapubic discomfort and increased urinary frequency - UA positive for LE and pyuria - Foley exchanged in the ED - On Rocephin , cultures suggest poor collection, will empirically treat for 7 days.   #Suspected bacterial vaginosis - New vaginal discharge, recently completed course of Diflucan  - Received Flagyl  in the ED, much improved no clue cells, complete 5-day course discussed with  OB on-call on 07/19/2024.  Was discharged follow-up with PCP.    # Acute metabolic encephalopathy on dementia - Per patient's daughter, patient with 2 days of confusion likely multifactorial in the setting of AKI, dehydration, infectious processes as above, superimposed on dementia and chronic opioids, no focal deficits, no headache, minimize narcotics and benzodiazepines, ammonia stable, much improved with supportive care continue to monitor.  Stable  B12, stable TSH free T4 slightly elevated request PCP to recheck both in 4 to 6 weeks.    # Hypermagnesemia  - Mg mildly elevated to 3.4 in the setting of underlying AKI - Continue to monitor   # Hypertension - BP low-normal on arrival, blood pressure is improved will reintroduce Norvasc  continue metoprolol , she is also on ARB and Lasix  will monitor and adjust as needed.   Right ear discomfort.  Exam limited due to wax.  Likely eustachian tube congestion, Nasonex and Zyrtec added.  Monitor.    #HLD - Continue home atorvastatin    #Chronic anemia - Hgb stable - Continue home PO iron, PCP to monitor outpatient and do age-appropriate workup as needed.    # T2DM - Diet controlled - ACHS,  Low dose SSI  Lab Results  Component Value Date   HGBA1C 6.2 (H) 07/18/2024   CBG (last 3)  Recent Labs    07/21/24 1606 07/21/24 2108 07/22/24 0735  GLUCAP 214* 183* 138*         Condition - Extremely Guarded  Family Communication  :  called daughter [name] [phone] 07/19/2024 at 7:45 AM message left, called again 07/20/2024 at 8:48 AM and message left, discussed with daughter bedside on 07/21/2024  Code Status :  Full  Consults : Discussed with OB on-call on 07/19/2024  PUD Prophylaxis :    Procedures  :            Disposition Plan  :    Status is: Inpatient  DVT Prophylaxis  :    SCDs Start: 07/17/24 2212    Lab Results  Component Value Date   PLT 405 (H) 07/21/2024    Diet :  Diet Order             Diet  regular Room service appropriate? Yes; Fluid consistency: Thin  Diet effective now                    Inpatient Medications  Scheduled Meds:  amLODipine   10 mg Oral Daily   aspirin   81 mg Oral Daily   atorvastatin   40 mg Oral QHS   Chlorhexidine  Gluconate Cloth  6 each Topical Daily   feeding supplement  237 mL Oral BID BM   ferrous sulfate   325 mg Oral BID WC   fluticasone   2 spray Each Nare Daily   Influenza vac split trivalent PF  0.5 mL Intramuscular Tomorrow-1000   insulin  aspart  0-6 Units Subcutaneous TID WC   loratadine   10 mg Oral Daily   metoprolol  succinate  25 mg Oral Daily   metroNIDAZOLE   500 mg Oral Q12H   nystatin    Topical TID   Continuous Infusions:  cefTRIAXone  (ROCEPHIN )  IV Stopped (07/21/24 2220)   PRN Meds:.acetaminophen  **OR** acetaminophen , HYDROcodone -acetaminophen , melatonin, ondansetron  (ZOFRAN ) IV  Antibiotics  :    Anti-infectives (From admission, onward)    Start     Dose/Rate Route Frequency Ordered Stop   07/17/24 2300  cefTRIAXone  (ROCEPHIN ) 1 g in sodium chloride  0.9 % 100 mL IVPB        1 g 200 mL/hr over 30 Minutes Intravenous Daily at bedtime 07/17/24 2249     07/17/24 2200  metroNIDAZOLE  (FLAGYL ) tablet 500 mg        500 mg Oral Every 12 hours 07/17/24 2039           Objective:   Vitals:   07/22/24 0000 07/22/24 0335 07/22/24 0338 07/22/24 0732  BP: 111/63 114/64  ROLLEN)  113/54  Pulse: 69 79    Resp: 13 14    Temp: 97.7 F (36.5 C) 97.9 F (36.6 C)  97.9 F (36.6 C)  TempSrc: Oral Oral  Oral  SpO2: 97% 97%    Weight:   81.7 kg   Height:        Wt Readings from Last 3 Encounters:  07/22/24 81.7 kg  06/09/24 83.5 kg  05/15/24 88.5 kg     Intake/Output Summary (Last 24 hours) at 07/22/2024 0844 Last data filed at 07/21/2024 1932 Gross per 24 hour  Intake --  Output 800 ml  Net -800 ml     Physical Exam  Awake Alert, No new F.N deficits, Normal affect [place].AT,PERRAL,  Supple Neck, No JVD,   Symmetrical  Chest wall movement, Good air movement bilaterally, CTAB RRR,No Gallops,Rubs or new Murmurs,  +ve B.Sounds, Abd Soft, No tenderness,   No Cyanosis, Clubbing or edema       Data Review:    Recent Labs  Lab 07/17/24 2047 07/18/24 0637 07/19/24 0308 07/20/24 0318 07/21/24 0411  WBC 10.4 9.6 8.4 7.4 8.5  HGB 9.5* 9.2* 9.6* 8.6* 9.0*  HCT 30.4* 29.3* 30.8* 27.4* 28.7*  PLT 392 373 378 354 405*  MCV 81.5 80.9 80.6 80.6 80.6  MCH 25.5* 25.4* 25.1* 25.3* 25.3*  MCHC 31.3 31.4 31.2 31.4 31.4  RDW 17.6* 17.7* 17.7* 17.9* 17.6*  LYMPHSABS 1.9 2.2  --  1.6 2.3  MONOABS 0.8 0.8  --  0.7 0.7  EOSABS 0.0 0.1  --  0.1 0.1  BASOSABS 0.0 0.0  --  0.0 0.0    Recent Labs  Lab 07/17/24 2047 07/18/24 0637 07/18/24 1844 07/19/24 0308 07/20/24 0318 07/21/24 0411  NA 133* 135  --  135 132* 137  K 4.8 4.4  --  4.2 4.1 4.2  CL 93* 95*  --  96* 96* 99  CO2 29 26  --  28 27 25   ANIONGAP 11 14  --  11 9 13   GLUCOSE 137* 94  --  120* 161* 120*  BUN 75* 68*  --  53* 38* 25*  CREATININE 1.92* 1.78*  --  1.38* 0.82 1.04*  AST  --  15  --   --   --   --   ALT  --  15  --   --   --   --   ALKPHOS  --  63  --   --   --   --   BILITOT  --  0.9  --   --   --   --   ALBUMIN  --  2.4*  --   --   --   --   INR  --   --  1.1  --   --   --   TSH  --  4.483  --  4.236  --   --   HGBA1C  --  6.2*  --   --   --   --   AMMONIA  --  <13  --   --   --   --   MG  --  3.4*  --  3.1* 2.7* 2.3  CALCIUM  8.2* 8.5*  --  8.3* 8.2* 8.4*      Recent Labs  Lab 07/17/24 2047 07/18/24 0637 07/18/24 1844 07/19/24 0308 07/20/24 0318 07/21/24 0411  INR  --   --  1.1  --   --   --   TSH  --  4.483  --  4.236  --   --   HGBA1C  --  6.2*  --   --   --   --   AMMONIA  --  <13  --   --   --   --   MG  --  3.4*  --  3.1* 2.7* 2.3  CALCIUM  8.2* 8.5*  --  8.3* 8.2* 8.4*    --------------------------------------------------------------------------------------------------------------- Lab Results  Component Value  Date   CHOL 121 03/08/2024   HDL 45.50 03/08/2024   LDLCALC 52 03/08/2024   TRIG 115.0 03/08/2024   CHOLHDL 3 03/08/2024    Lab Results  Component Value Date   HGBA1C 6.2 (H) 07/18/2024   No results for input(s): TSH, T4TOTAL, FREET4, T3FREE, THYROIDAB in the last 72 hours.  No results for input(s): VITAMINB12, FOLATE, FERRITIN, TIBC, IRON, RETICCTPCT in the last 72 hours.  ------------------------------------------------------------------------------------------------------------------ Cardiac Enzymes No results for input(s): CKMB, TROPONINI, MYOGLOBIN in the last 168 hours.  Invalid input(s): CK  Micro Results Recent Results (from the past 240 hours)  Urine Culture     Status: Abnormal   Collection Time: 07/17/24 10:00 PM   Specimen: Urine, Catheterized  Result Value Ref Range Status   Specimen Description URINE, CATHETERIZED  Final   Special Requests   Final    NONE Performed at [facility], [address], [place], [place] [address]    Culture MULTIPLE SPECIES PRESENT, SUGGEST RECOLLECTION (A)  Final   Report Status 07/19/2024 FINAL  Final  Wet prep, genital     Status: Abnormal   Collection Time: 07/18/24 10:11 AM  Result Value Ref Range Status   Yeast Wet Prep HPF POC NONE SEEN NONE SEEN Final   Trich, Wet Prep NONE SEEN NONE SEEN Final   Clue Cells Wet Prep HPF POC NONE SEEN NONE SEEN Final   WBC, Wet Prep HPF POC >=10 (A) <10 Final   Sperm NONE SEEN  Final    Comment: Performed at [facility], [address], [place], [place] [address]    Radiology Report No results found.   Signature  -   Lavada Stank M.D on 07/22/2024 at 8:44 AM   -  To page go to [email or web address]

## 2024-07-23 DIAGNOSIS — N179 Acute kidney failure, unspecified: Secondary | ICD-10-CM | POA: Diagnosis not present

## 2024-07-23 LAB — CBC WITH DIFFERENTIAL/PLATELET
Abs Immature Granulocytes: 0.03 K/uL (ref 0.00–0.07)
Basophils Absolute: 0 K/uL (ref 0.0–0.1)
Basophils Relative: 1 %
Eosinophils Absolute: 0.2 K/uL (ref 0.0–0.5)
Eosinophils Relative: 3 %
HCT: 27.6 % — ABNORMAL LOW (ref 36.0–46.0)
Hemoglobin: 8.6 g/dL — ABNORMAL LOW (ref 12.0–15.0)
Immature Granulocytes: 1 %
Lymphocytes Relative: 31 %
Lymphs Abs: 2 K/uL (ref 0.7–4.0)
MCH: 25.1 pg — ABNORMAL LOW (ref 26.0–34.0)
MCHC: 31.2 g/dL (ref 30.0–36.0)
MCV: 80.7 fL (ref 80.0–100.0)
Monocytes Absolute: 0.6 K/uL (ref 0.1–1.0)
Monocytes Relative: 8 %
Neutro Abs: 3.7 K/uL (ref 1.7–7.7)
Neutrophils Relative %: 56 %
Platelets: 362 K/uL (ref 150–400)
RBC: 3.42 MIL/uL — ABNORMAL LOW (ref 3.87–5.11)
RDW: 17.8 % — ABNORMAL HIGH (ref 11.5–15.5)
WBC: 6.6 K/uL (ref 4.0–10.5)
nRBC: 0 % (ref 0.0–0.2)

## 2024-07-23 LAB — PROCALCITONIN: Procalcitonin: <0.1 ng/mL

## 2024-07-23 LAB — BASIC METABOLIC PANEL WITH GFR
Anion gap: 10 (ref 5–15)
BUN: 28 mg/dL — ABNORMAL HIGH (ref 8–23)
CO2: 27 mmol/L (ref 22–32)
Calcium: 8.5 mg/dL — ABNORMAL LOW (ref 8.9–10.3)
Chloride: 102 mmol/L (ref 98–111)
Creatinine, Ser: 1.11 mg/dL — ABNORMAL HIGH (ref 0.44–1.00)
GFR, Estimated: 49 mL/min — ABNORMAL LOW (ref >60)
Glucose, Bld: 112 mg/dL — ABNORMAL HIGH (ref 70–99)
Potassium: 4.3 mmol/L (ref 3.5–5.1)
Sodium: 139 mmol/L (ref 135–145)

## 2024-07-23 LAB — GLUCOSE, CAPILLARY
Glucose-Capillary: 110 mg/dL — ABNORMAL HIGH (ref 70–99)
Glucose-Capillary: 116 mg/dL — ABNORMAL HIGH (ref 70–99)
Glucose-Capillary: 164 mg/dL — ABNORMAL HIGH (ref 70–99)
Glucose-Capillary: 171 mg/dL — ABNORMAL HIGH (ref 70–99)

## 2024-07-23 LAB — TYPE AND SCREEN
ABO/RH(D): O POS
Antibody Screen: NEGATIVE

## 2024-07-23 LAB — TSH: TSH: 5.412 u[IU]/mL — ABNORMAL HIGH (ref 0.350–4.500)

## 2024-07-23 LAB — T4, FREE: Free T4: 1.07 ng/dL (ref 0.61–1.12)

## 2024-07-23 LAB — C-REACTIVE PROTEIN: CRP: 0.5 mg/dL (ref <1.0)

## 2024-07-23 LAB — LACTIC ACID, PLASMA
Lactic Acid, Venous: 0.7 mmol/L (ref 0.5–1.9)
Lactic Acid, Venous: 1.1 mmol/L (ref 0.5–1.9)

## 2024-07-23 LAB — CORTISOL: Cortisol, Plasma: 9.8 ug/dL

## 2024-07-23 LAB — MAGNESIUM: Magnesium: 2.1 mg/dL (ref 1.7–2.4)

## 2024-07-23 MED ORDER — LACTATED RINGERS IV SOLN: INTRAVENOUS | Status: AC

## 2024-07-23 MED ORDER — LACTATED RINGERS IV SOLN: INTRAVENOUS | Status: DC

## 2024-07-23 MED ORDER — LACTATED RINGERS IV BOLUS
1000.0000 mL | Freq: Once | INTRAVENOUS | Status: AC
  Administered 2024-07-23: 1000 mL via INTRAVENOUS

## 2024-07-23 MED ORDER — TRAMADOL HCL 50 MG PO TABS
50.0000 mg | ORAL_TABLET | Freq: Two times a day (BID) | ORAL | Status: DC | PRN
  Administered 2024-07-23 – 2024-07-24 (×2): 50 mg via ORAL
  Filled 2024-07-23 (×2): qty 1

## 2024-07-23 NOTE — Progress Notes (Signed)
 PROGRESS NOTE     Patient Demographics:    Krista Clarke, is a 83 y.o. female, DOB - 1940/11/28, FMW:[id number]  Outpatient Primary MD for the patient is [name], MD    LOS - 6  Admit date - 07/17/2024    Chief Complaint  Patient presents with   Altered Mental Status       Brief Narrative (HPI from H&P)   83 year old female with PMHx of dementia, complete heart block status post PPM ([employer], 07/2015), T2DM, HTN, HLD, chronic pain syndrome on chronic opiate therapy, chronic indwelling Foley catheter, ACD, who presented to [facility] ED from home on 07/17/2024 due to increased urinary frequency, dysuria, vaginal discharge, decreased PO intake, and altered mental status, found to have an AKI.     Subjective:   Patient in bed, appears comfortable, denies any headache, no fever, no chest pain or pressure, no shortness of breath , no abdominal pain. No focal weakness.   Assessment  & Plan :    #AKI - Likely prerenal from dehydration and poor oral intake as well as outpatient Lasix  and losartan  use - Improving with IV fluids continue hold offending medications which include ARB and Lasix .   # Acute cystitis due to chronic indwelling Foley catheter present on admission. - Presenting with suprapubic discomfort and increased urinary frequency - UA positive for LE and pyuria - Foley exchanged in the ED - On Rocephin , cultures suggest poor collection, will empirically treat for 7 days.   #Suspected bacterial vaginosis - New vaginal discharge, recently completed course of Diflucan  - Received Flagyl  in the ED, much improved no clue cells, complete 5-day course discussed with OB on-call on 07/19/2024.  Was discharged follow-up  with PCP.    # Acute metabolic encephalopathy on dementia - Per patient's daughter, patient with 2 days of confusion likely multifactorial in the setting of AKI, dehydration, infectious processes as above, superimposed on dementia and chronic opioids, no focal deficits, no headache, minimize narcotics and benzodiazepines, ammonia stable, much improved with supportive care continue to monitor.  Stable B12, stable TSH free T4 slightly elevated  request PCP to recheck both in 4 to 6 weeks.    # Hypermagnesemia  - Mg mildly elevated to 3.4 in the setting of underlying AKI - Continue to monitor   # Hypertension - BP running low on 07/23/2024, hold blood pressure medications and diuretics, not eating or drinking well, hydrate, check CBC, BMP TSH and cortisol.  Patient appears nonacute, lactic acid stable will continue to monitor.  No specific subjective complaints.   Right ear discomfort.  Exam limited due to wax.  Likely eustachian tube congestion, Nasonex and Zyrtec added.  Monitor.    #HLD - Continue home atorvastatin    #Chronic anemia - Hgb stable - Continue home PO iron, PCP to monitor outpatient and do age-appropriate workup as needed.    # T2DM - Diet controlled - ACHS,  Low dose SSI  Lab Results  Component Value Date   HGBA1C 6.2 (H) 07/18/2024   CBG (last 3)  Recent Labs    07/22/24 1615 07/22/24 2140 07/23/24 0724  GLUCAP 277* 103* 110*         Condition - Extremely Guarded  Family Communication  :  called daughter [name] [phone] 07/19/2024 at 7:45 AM message left, called again 07/20/2024 at 8:48 AM and message left, discussed with daughter bedside on 07/21/2024  Code Status :  Full  Consults : Discussed with OB on-call on 07/19/2024  PUD Prophylaxis :    Procedures  :            Disposition Plan  :    Status is: Inpatient  DVT Prophylaxis  :    SCDs Start: 07/17/24 2212    Lab Results  Component Value Date   PLT 405 (H) 07/21/2024     Diet :  Diet Order             Diet regular Room service appropriate? Yes; Fluid consistency: Thin  Diet effective now                    Inpatient Medications  Scheduled Meds:  aspirin   81 mg Oral Daily   atorvastatin   40 mg Oral QHS   Chlorhexidine  Gluconate Cloth  6 each Topical Daily   feeding supplement  237 mL Oral BID BM   ferrous sulfate   325 mg Oral BID WC   fluticasone   2 spray Each Nare Daily   Influenza vac split trivalent PF  0.5 mL Intramuscular Tomorrow-1000   insulin  aspart  0-6 Units Subcutaneous TID WC   loratadine   10 mg Oral Daily   metroNIDAZOLE   500 mg Oral Q12H   nystatin    Topical TID   Continuous Infusions:  cefTRIAXone  (ROCEPHIN )  IV Stopped (07/22/24 2334)   lactated ringers      lactated ringers  75 mL/hr at 07/23/24 0424   lactated ringers      PRN Meds:.acetaminophen  **OR** acetaminophen , melatonin, ondansetron  (ZOFRAN ) IV  Antibiotics  :    Anti-infectives (From admission, onward)    Start     Dose/Rate Route Frequency Ordered Stop   07/17/24 2300  cefTRIAXone  (ROCEPHIN ) 1 g in sodium chloride  0.9 % 100 mL IVPB        1 g 200 mL/hr over 30 Minutes Intravenous Daily at bedtime 07/17/24 2249     07/17/24 2200  metroNIDAZOLE  (FLAGYL ) tablet 500 mg        500 mg Oral Every 12 hours 07/17/24 2039           Objective:   Vitals:   07/23/24 0500 07/23/24  0508 07/23/24 0600 07/23/24 0721  BP: (!) 114/45  (!) 110/50 (!) 112/53  Pulse:    77  Resp:    19  Temp:    98.2 F (36.8 C)  TempSrc:    Oral  SpO2:    96%  Weight:  84.1 kg    Height:        Wt Readings from Last 3 Encounters:  07/23/24 84.1 kg  06/09/24 83.5 kg  05/15/24 88.5 kg     Intake/Output Summary (Last 24 hours) at 07/23/2024 0916 Last data filed at 07/22/2024 1619 Gross per 24 hour  Intake --  Output 375 ml  Net -375 ml     Physical Exam  Awake Alert, No new F.N deficits, Normal affect [place].AT,PERRAL,  Supple Neck, No JVD,   Symmetrical Chest  wall movement, Good air movement bilaterally, CTAB RRR,No Gallops,Rubs or new Murmurs,  +ve B.Sounds, Abd Soft, No tenderness,   No Cyanosis, Clubbing or edema       Data Review:    Recent Labs  Lab 07/17/24 2047 07/18/24 0637 07/19/24 0308 07/20/24 0318 07/21/24 0411  WBC 10.4 9.6 8.4 7.4 8.5  HGB 9.5* 9.2* 9.6* 8.6* 9.0*  HCT 30.4* 29.3* 30.8* 27.4* 28.7*  PLT 392 373 378 354 405*  MCV 81.5 80.9 80.6 80.6 80.6  MCH 25.5* 25.4* 25.1* 25.3* 25.3*  MCHC 31.3 31.4 31.2 31.4 31.4  RDW 17.6* 17.7* 17.7* 17.9* 17.6*  LYMPHSABS 1.9 2.2  --  1.6 2.3  MONOABS 0.8 0.8  --  0.7 0.7  EOSABS 0.0 0.1  --  0.1 0.1  BASOSABS 0.0 0.0  --  0.0 0.0    Recent Labs  Lab 07/17/24 2047 07/18/24 0637 07/18/24 1844 07/19/24 0308 07/20/24 0318 07/21/24 0411 07/23/24 0010 07/23/24 0343  NA 133* 135  --  135 132* 137  --   --   K 4.8 4.4  --  4.2 4.1 4.2  --   --   CL 93* 95*  --  96* 96* 99  --   --   CO2 29 26  --  28 27 25   --   --   ANIONGAP 11 14  --  11 9 13   --   --   GLUCOSE 137* 94  --  120* 161* 120*  --   --   BUN 75* 68*  --  53* 38* 25*  --   --   CREATININE 1.92* 1.78*  --  1.38* 0.82 1.04*  --   --   AST  --  15  --   --   --   --   --   --   ALT  --  15  --   --   --   --   --   --   ALKPHOS  --  63  --   --   --   --   --   --   BILITOT  --  0.9  --   --   --   --   --   --   ALBUMIN  --  2.4*  --   --   --   --   --   --   LATICACIDVEN  --   --   --   --   --   --  1.1 0.7  INR  --   --  1.1  --   --   --   --   --  TSH  --  4.483  --  4.236  --   --   --   --   HGBA1C  --  6.2*  --   --   --   --   --   --   AMMONIA  --  <13  --   --   --   --   --   --   MG  --  3.4*  --  3.1* 2.7* 2.3  --   --   CALCIUM  8.2* 8.5*  --  8.3* 8.2* 8.4*  --   --       Recent Labs  Lab 07/17/24 2047 07/18/24 0637 07/18/24 1844 07/19/24 0308 07/20/24 0318 07/21/24 0411 07/23/24 0010 07/23/24 0343  LATICACIDVEN  --   --   --   --   --   --  1.1 0.7  INR  --   --  1.1  --    --   --   --   --   TSH  --  4.483  --  4.236  --   --   --   --   HGBA1C  --  6.2*  --   --   --   --   --   --   AMMONIA  --  <13  --   --   --   --   --   --   MG  --  3.4*  --  3.1* 2.7* 2.3  --   --   CALCIUM  8.2* 8.5*  --  8.3* 8.2* 8.4*  --   --     --------------------------------------------------------------------------------------------------------------- Lab Results  Component Value Date   CHOL 121 03/08/2024   HDL 45.50 03/08/2024   LDLCALC 52 03/08/2024   TRIG 115.0 03/08/2024   CHOLHDL 3 03/08/2024    Lab Results  Component Value Date   HGBA1C 6.2 (H) 07/18/2024   No results for input(s): TSH, T4TOTAL, FREET4, T3FREE, THYROIDAB in the last 72 hours.  No results for input(s): VITAMINB12, FOLATE, FERRITIN, TIBC, IRON, RETICCTPCT in the last 72 hours.  ------------------------------------------------------------------------------------------------------------------ Cardiac Enzymes No results for input(s): CKMB, TROPONINI, MYOGLOBIN in the last 168 hours.  Invalid input(s): CK  Micro Results Recent Results (from the past 240 hours)  Urine Culture     Status: Abnormal   Collection Time: 07/17/24 10:00 PM   Specimen: Urine, Catheterized  Result Value Ref Range Status   Specimen Description URINE, CATHETERIZED  Final   Special Requests   Final    NONE Performed at [facility], [address] [place], [place] [address]    Culture MULTIPLE SPECIES PRESENT, SUGGEST RECOLLECTION (A)  Final   Report Status 07/19/2024 FINAL  Final  Wet prep, genital     Status: Abnormal   Collection Time: 07/18/24 10:11 AM  Result Value Ref Range Status   Yeast Wet Prep HPF POC NONE SEEN NONE SEEN Final   Trich, Wet Prep NONE SEEN NONE SEEN Final   Clue Cells Wet Prep HPF POC NONE SEEN NONE SEEN Final   WBC, Wet Prep HPF POC >=10 (A) <10 Final   Sperm NONE SEEN  Final    Comment: Performed at [facility], [address]  [place], [place] [address]    Radiology Report No results found.   Signature  -   Lavada Stank M.D on 07/23/2024 at 9:16 AM   -  To page go to [email or web address]

## 2024-07-23 NOTE — Significant Event (Signed)
 Patient's nurse notified me that patient's blood pressure systolic is in the 90s with MAP of 60.  On exam at bedside patient not in distress.  Reviewed patient's medications labs and notes.  Will hold off patient's metoprolol .  I ordered 1 L bolus of LR and will check lactic acid.  Redia Cleaver. MD.

## 2024-07-23 NOTE — TOC Progression Note (Signed)
 Transition of Care [facility]) - Progression Note    Patient Details  Name: Krista Clarke MRN: [id number] Date of Birth: 07-Jun-1941  [facility]) CM/SW Contact  Landry DELENA Senters, RN Phone Number: 07/23/2024, 2:30 PM  Clinical Narrative:    CM contacted [employer] to check on status of hospital bed. They report this is scheduled to be delivered today. CM did contact patient's daughter, Krista Clarke, to confirm this and she reports it is to be delivered around 4pm today.   CM will continue to follow.    Expected Discharge Plan: Home w Home Health Services Barriers to Discharge: No Barriers Identified               Expected Discharge Plan and Services       Living arrangements for the past 2 months: Single Family Home                                       Social Drivers of Health (SDOH) Interventions SDOH Screenings   Food Insecurity: No Food Insecurity (07/18/2024)  Housing: Low Risk  (07/18/2024)  Transportation Needs: No Transportation Needs (07/18/2024)  Utilities: Not At Risk (07/18/2024)  Depression (PHQ2-9): Low Risk  (07/04/2024)  Financial Resource Strain: Low Risk  (01/18/2023)  Physical Activity: Inactive (03/06/2021)  Social Connections: Socially Isolated (07/18/2024)  Stress: No Stress Concern Present (03/06/2021)  Tobacco Use: Low Risk  (07/17/2024)    Readmission Risk Interventions    06/12/2024    3:09 PM 02/21/2024   11:40 AM  Readmission Risk Prevention Plan  Transportation Screening Complete Complete  PCP or Specialist Appt within 5-7 Days  Complete  PCP or Specialist Appt within 3-5 Days Complete   Home Care Screening  Complete  Medication Review (RN CM)  Complete  Social Work Consult for Recovery Care Planning/Counseling Complete   Palliative Care Screening Not Applicable   Medication Review Oceanographer) Complete

## 2024-07-23 NOTE — Evaluation (Signed)
 Occupational Therapy Evaluation Patient Details Name: Krista Clarke MRN: [id number] DOB: [birth year] Today's Date: 07/23/2024   History of Present Illness   Pt is an 83 y/o F admitted on 07/17/24 after presenting with c/o increased urinary frequency, dysuria, vaginal discharge, decreased PO intake, AMS. Pt found to have AKI, acute cystitis 2/2 chronic indwelling foley, suspected bacterial vaginosis, & acute metabolic encephalopathy.  PMH: dementia, complete heart block s/p PPM, DM2, HTN, HLD, CPS on chronic opiate therapy, chronic indwelling foley catheter, ACD     Clinical Impressions PTA, pt living with her son and daughter present in room providing history as pt poor historian. Since recent falls, pt predominantly cared for at bed level since prior admission and needing max A for scoot onto BSC, sponge bathing, and all set-up for ADL provided by family. Pt and daughter reporting home has been difficult for them. Prior to recent falls and admissions, pt ambulating and taking care of BADL without assist. Upon eval, pt significantly fearful of falling, with decreased balance, strength, and body mechanics. Pt needing mod A for bed mobility and max A for lateral scoot. Engaged pt in chair push ups at EOB and fair technique but unable to fully clear hips from EOB; attempted one STS and with OT facilitation of anterior weight shift, pt immediately leans backward due to fear of falling. Pt daughter reports they do not want to go to a SNF rehab; believe an inpatient rehab would be best venue of care for pt at current functional level and given sacral wound.      If plan is discharge home, recommend the following:   Two people to help with walking and/or transfers;Two people to help with bathing/dressing/bathroom;Assistance with cooking/housework;Assist for transportation;Help with stairs or ramp for entrance;Direct supervision/assist for financial management;Direct supervision/assist for medications  management     Functional Status Assessment   Patient has had a recent decline in their functional status and demonstrates the ability to make significant improvements in function in a reasonable and predictable amount of time.     Equipment Recommendations   Other (comment) (drop arm BSC)     Recommendations for Other Services   Rehab consult (on dtr request)     Precautions/Restrictions   Precautions Precautions: Fall Restrictions Weight Bearing Restrictions Per Provider Order: No     Mobility Bed Mobility Overal bed mobility: Needs Assistance Bed Mobility: Supine to Sit, Sit to Supine     Supine to sit: Mod assist Sit to supine: Max assist        Transfers Overall transfer level: Needs assistance Equipment used: None Transfers: Bed to chair/wheelchair/BSC            Lateral/Scoot Transfers: Max assist General transfer comment: poor initiation and sustaining task very fearful of falling. also attempted STS transfer but pt with max fear of falling when OT initiates assistance for anterior weight shift, pt pushing back      Balance Overall balance assessment: Needs assistance Sitting-balance support: Feet supported, Bilateral upper extremity supported Sitting balance-Leahy Scale: Fair Sitting balance - Comments: supervision static sitting                                   ADL either performed or assessed with clinical judgement   ADL Overall ADL's : Needs assistance/impaired Eating/Feeding: Set up;Sitting (CGA)   Grooming: Contact guard assist;Sitting   Upper Body Bathing: Set up;Sitting   Lower Body Bathing: Maximal  assistance;Total assistance;Sitting/lateral leans   Upper Body Dressing : Set up;Sitting;Contact guard assist   Lower Body Dressing: Total assistance;Sitting/lateral leans;Bed level                       Vision Patient Visual Report: No change from baseline       Perception         Praxis          Pertinent Vitals/Pain Pain Assessment Pain Assessment: Faces Faces Pain Scale: Hurts little more Pain Location: BLE, back, buttocks Pain Descriptors / Indicators: Grimacing, Guarding, Discomfort Pain Intervention(s): Limited activity within patient's tolerance, Monitored during session     Extremity/Trunk Assessment Upper Extremity Assessment Upper Extremity Assessment: Generalized weakness (difficuly raising arms above head; unsure if this is baseline)   Lower Extremity Assessment Lower Extremity Assessment: Defer to PT evaluation   Cervical / Trunk Assessment Cervical / Trunk Assessment: Kyphotic   Communication Communication Communication: No apparent difficulties   Cognition Arousal: Alert Behavior During Therapy: WFL for tasks assessed/performed Cognition: History of cognitive impairments             OT - Cognition Comments: follows one step commands with cues as needed                 Following commands: Impaired Following commands impaired: Follows one step commands with increased time     Cueing  General Comments   Cueing Techniques: Verbal cues      Exercises     Shoulder Instructions      Home Living Family/patient expects to be discharged to:: Private residence Living Arrangements: Alone Available Help at Discharge: Family Type of Home: House Home Access: Stairs to enter [employer] of Steps: 4 at front Entrance Stairs-Rails: Right;Left (reports rails aren't sturdy) Home Layout: One level     Bathroom Shower/Tub: Sponge bathes at baseline (sponge bathing since onset of multiple falls)         Home Equipment: [employer]ant (2 wheels);Cane - single point          Prior Functioning/Environment Prior Level of Function : Needs assist             Mobility Comments: Reports she's ambulatory without AD but has used SPC/RW before; however, daughter in room Krista Clarke) reporting that she and her brother have been scooting  her from bed to [place] ADLs Comments: daughter assists with sponge bathing    OT Problem List: Decreased strength;Decreased activity tolerance;Impaired balance (sitting and/or standing);Decreased safety awareness;Decreased cognition;Decreased knowledge of use of DME or AE;Pain   OT Treatment/Interventions: Self-care/ADL training;Therapeutic exercise;DME and/or AE instruction;Therapeutic activities;Patient/family education;Balance training      OT Goals(Current goals can be found in the care plan section)   Acute Rehab OT Goals Patient Stated Goal: get better OT Goal Formulation: With patient Time For Goal Achievement: 08/06/24 Potential to Achieve Goals: Good   OT Frequency:  Min 2X/week    Co-evaluation              AM-PAC OT 6 Clicks Daily Activity     Outcome Measure Help from another person eating meals?: A Little Help from another person taking care of personal grooming?: A Little Help from another person toileting, which includes using toliet, bedpan, or urinal?: Total Help from another person bathing (including washing, rinsing, drying)?: A Lot Help from another person to put on and taking off regular upper body clothing?: A Little Help from another person to put on and taking off  regular lower body clothing?: Total 6 Click Score: 13   End of Session Equipment Utilized During Treatment: Gait belt;Rolling walker (2 wheels) Nurse Communication: Mobility status  Activity Tolerance: Patient tolerated treatment well Patient left: in bed;with call bell/phone within reach;with bed alarm set  OT Visit Diagnosis: Unsteadiness on feet (R26.81);Muscle weakness (generalized) (M62.81);Pain;Other symptoms and signs involving cognitive function;History of falling (Z91.81)                Time: 8448-8365 OT Time Calculation (min): 43 min Charges:  OT General Charges $OT Visit: 1 Visit OT Evaluation $OT Eval Low Complexity: 1 Low OT Treatments $Self Care/Home Management :  23-37 mins  Elma JONETTA Lebron FREDERICK, OTR/L [facility] Acute Rehabilitation Office: [phone]   SHATORI BERTUCCI 07/23/2024, 5:07 PM

## 2024-07-23 NOTE — Evaluation (Signed)
 Physical Therapy Evaluation Patient Details Name: Krista Clarke MRN: [id number] DOB: 06/20/1941 Today's Date: 07/23/2024  History of Present Illness  Pt is an 83 y/o F admitted on 07/17/24 after presenting with c/o increased urinary frequency, dysuria, vaginal discharge, decreased PO intake, AMS. Pt found to have AKI, acute cystitis 2/2 chronic indwelling foley, suspected bacterial vaginosis, & acute metabolic encephalopathy.  PMH: dementia, complete heart block s/p PPM, DM2, HTN, HLD, CPS on chronic opiate therapy, chronic indwelling foley catheter, ACD  Clinical Impression  Pt seen for PT evaluation with pt agreeable, no family present. Pt is AxOx3, reports she was living with her daughter but her daughter bought a house & moved out, pt reports she will not have assistance at d/c. Pt reports prior to admission she was ambulatory without AD but has RW, SPC she can use. On this date, pt requires max assist for bed mobility & max assist for lateral scoot bed>drop arm recliner on R. Standing deferred 2/2 soft BP & pt with c/o dizziness while sitting EOB. Recommend ongoing PT services to progress mobility as able.  BP in LUE Sitting EOB: 90/70 mmHg MAP 77 Rechecked in sitting: 82/72 mmHg MAP 78 Sitting in recliner with BLE elevated: 100/67 mmHg MAP 78 Nurse aware of BP readings    If plan is discharge home, recommend the following: A lot of help with walking and/or transfers;A lot of help with bathing/dressing/bathroom;Assist for transportation;Help with stairs or ramp for entrance;Assistance with cooking/housework;Direct supervision/assist for financial management;Direct supervision/assist for medications management;Supervision due to cognitive status   Can travel by private vehicle   No    Equipment Recommendations Other (comment) (defer to next venue)  Recommendations for Other Services  Rehab consult;OT consult    Functional Status Assessment Patient has had a recent decline in their  functional status and demonstrates the ability to make significant improvements in function in a reasonable and predictable amount of time.     Precautions / Restrictions Precautions Precautions: Fall Restrictions Weight Bearing Restrictions Per Provider Order: No      Mobility  Bed Mobility Overal bed mobility: Needs Assistance Bed Mobility: Supine to Sit     Supine to sit: Max assist, Used rails, HOB elevated (cuing & assistance to move BLE to EOB with pt participating but pt requires max assist to upright trunk & scoot to sitting EOB; exit R side of bed)          Transfers Overall transfer level: Needs assistance Equipment used: None Transfers: Bed to chair/wheelchair/BSC            Lateral/Scoot Transfers: Max assist (pt initiates scooting, PT assisting, pt requiring assistance for repositioning BLE, sequencing)      Ambulation/Gait                  Stairs            Wheelchair Mobility     Tilt Bed    Modified Rankin (Stroke Patients Only)       Balance Overall balance assessment: Needs assistance Sitting-balance support: Feet supported, Bilateral upper extremity supported Sitting balance-Leahy Scale: Fair Sitting balance - Comments: supervision static sitting                                     Pertinent Vitals/Pain Pain Assessment Pain Assessment: Faces Faces Pain Scale: Hurts little more Pain Location: BLE, back, buttocks Pain Descriptors / Indicators: Grimacing, Guarding,  Discomfort Pain Intervention(s): Limited activity within patient's tolerance, Monitored during session, Repositioned    Home Living Family/patient expects to be discharged to:: Private residence Living Arrangements: Alone Available Help at Discharge: Family Type of Home: House Home Access: Stairs to enter Entrance Stairs-Rails: Right;Left (reports rails aren't sturdy) Entrance Stairs-Number of Steps: 4 at front   Home Layout: One  level Home Equipment: [employer]ant (2 wheels);Cane - single point      Prior Function               Mobility Comments: Reports she's ambulatory without AD but has used SPC/RW before ADLs Comments: daughter assists with sponge bathing     Extremity/Trunk Assessment   Upper Extremity Assessment Upper Extremity Assessment: Generalized weakness    Lower Extremity Assessment Lower Extremity Assessment: Generalized weakness (2/5 R knee extension, 2+/5 L knee extension)       Communication   Communication Communication: No apparent difficulties    Cognition Arousal: Alert Behavior During Therapy: WFL for tasks assessed/performed   PT - Cognitive impairments: No family/caregiver present to determine baseline, Orientation   Orientation impairments: Time (not oriented to month or year without cuing)                     Following commands: Impaired Following commands impaired: Follows one step commands with increased time     Cueing Cueing Techniques: Verbal cues     General Comments      Exercises     Assessment/Plan    PT Assessment All further PT needs can be met in the next venue of care  PT Problem List Decreased strength;Decreased range of motion;Decreased cognition;Decreased activity tolerance;Decreased knowledge of use of DME;Decreased balance;Decreased safety awareness;Decreased mobility       PT Treatment Interventions      PT Goals (Current goals can be found in the Care Plan section)  Acute Rehab PT Goals Patient Stated Goal: get better PT Goal Formulation: With patient Time For Goal Achievement: 08/06/24 Potential to Achieve Goals: Good    Frequency       Co-evaluation               AM-PAC PT 6 Clicks Mobility  Outcome Measure Help needed turning from your back to your side while in a flat bed without using bedrails?: A Lot Help needed moving from lying on your back to sitting on the side of a flat bed without using  bedrails?: Total Help needed moving to and from a bed to a chair (including a wheelchair)?: A Lot Help needed standing up from a chair using your arms (e.g., wheelchair or bedside chair)?: A Lot Help needed to walk in hospital room?: Total Help needed climbing 3-5 steps with a railing? : Total 6 Click Score: 9    End of Session   Activity Tolerance: Treatment limited secondary to medical complications (Comment) (limited 2/2 low BP) Patient left: in chair;with call bell/phone within reach;with nursing/sitter in room;with chair alarm set Nurse Communication: Mobility status PT Visit Diagnosis: Muscle weakness (generalized) (M62.81);Difficulty in walking, not elsewhere classified (R26.2);Other abnormalities of gait and mobility (R26.89)    Time: 8983-8961 PT Time Calculation (min) (ACUTE ONLY): 22 min   Charges:   PT Evaluation $PT Eval Low Complexity: 1 Low   PT General Charges $$ ACUTE PT VISIT: 1 Visit         Richerd Pinal, PT, DPT 07/23/24, 10:47 AM   Richerd CHRISTELLA Pinal 07/23/2024, 10:45 AM

## 2024-07-23 NOTE — Plan of Care (Signed)
  Problem: Coping: Goal: Ability to adjust to condition or change in health will improve Outcome: Progressing   Problem: Nutritional: Goal: Maintenance of adequate nutrition will improve Outcome: Progressing   Problem: Health Behavior/Discharge Planning: Goal: Ability to manage health-related needs will improve Outcome: Progressing   Problem: Clinical Measurements: Goal: Will remain free from infection Outcome: Progressing Goal: Diagnostic test results will improve Outcome: Progressing   Problem: Activity: Goal: Risk for activity intolerance will decrease Outcome: Progressing   Problem: Nutrition: Goal: Adequate nutrition will be maintained Outcome: Progressing   Problem: Pain Managment: Goal: General experience of comfort will improve and/or be controlled Outcome: Progressing

## 2024-07-23 NOTE — Progress Notes (Signed)
   07/23/24 1243  Mobility  Activity Pivoted/transferred from bed to chair;Turned to right side;Turned to left side;Turned to back - supine  Level of Assistance Total care (+4)  Assistive Device MaxiSlide  Activity Response Tolerated fair  Mobility Referral Yes  Mobility visit 1 Mobility  Mobility Specialist Start Time (ACUTE ONLY) 1243  Mobility Specialist Stop Time (ACUTE ONLY) 1258  Mobility Specialist Time Calculation (min) (ACUTE ONLY) 15 min   Mobility Specialist: Progress Note  Pt agreeable to mobility session - requested by NT - received in chair. Pt was asymptomatic throughout session with no complaints. Returned to bed with all needs met - call bell within reach.   Additional comments: Left with RN, NT, and visitor present.   Virgle Boards, BS Mobility Specialist Please contact via SecureChat or  Rehab office at [phone].

## 2024-07-24 ENCOUNTER — Other Ambulatory Visit (HOSPITAL_COMMUNITY): Payer: Self-pay

## 2024-07-24 DIAGNOSIS — N179 Acute kidney failure, unspecified: Secondary | ICD-10-CM | POA: Diagnosis not present

## 2024-07-24 LAB — CBC WITH DIFFERENTIAL/PLATELET
Abs Immature Granulocytes: 0.03 K/uL (ref 0.00–0.07)
Basophils Absolute: 0 K/uL (ref 0.0–0.1)
Basophils Relative: 0 %
Eosinophils Absolute: 0.2 K/uL (ref 0.0–0.5)
Eosinophils Relative: 3 %
HCT: 26.5 % — ABNORMAL LOW (ref 36.0–46.0)
Hemoglobin: 8.4 g/dL — ABNORMAL LOW (ref 12.0–15.0)
Immature Granulocytes: 1 %
Lymphocytes Relative: 28 %
Lymphs Abs: 1.8 K/uL (ref 0.7–4.0)
MCH: 25.9 pg — ABNORMAL LOW (ref 26.0–34.0)
MCHC: 31.7 g/dL (ref 30.0–36.0)
MCV: 81.8 fL (ref 80.0–100.0)
Monocytes Absolute: 0.6 K/uL (ref 0.1–1.0)
Monocytes Relative: 9 %
Neutro Abs: 3.9 K/uL (ref 1.7–7.7)
Neutrophils Relative %: 59 %
Platelets: 356 K/uL (ref 150–400)
RBC: 3.24 MIL/uL — ABNORMAL LOW (ref 3.87–5.11)
RDW: 17.9 % — ABNORMAL HIGH (ref 11.5–15.5)
WBC: 6.5 K/uL (ref 4.0–10.5)
nRBC: 0 % (ref 0.0–0.2)

## 2024-07-24 LAB — BASIC METABOLIC PANEL WITH GFR
Anion gap: 7 (ref 5–15)
BUN: 22 mg/dL (ref 8–23)
CO2: 25 mmol/L (ref 22–32)
Calcium: 8.5 mg/dL — ABNORMAL LOW (ref 8.9–10.3)
Chloride: 105 mmol/L (ref 98–111)
Creatinine, Ser: 0.93 mg/dL (ref 0.44–1.00)
GFR, Estimated: >60 mL/min (ref >60)
Glucose, Bld: 115 mg/dL — ABNORMAL HIGH (ref 70–99)
Potassium: 4 mmol/L (ref 3.5–5.1)
Sodium: 137 mmol/L (ref 135–145)

## 2024-07-24 LAB — MAGNESIUM: Magnesium: 2 mg/dL (ref 1.7–2.4)

## 2024-07-24 LAB — GLUCOSE, CAPILLARY
Glucose-Capillary: 113 mg/dL — ABNORMAL HIGH (ref 70–99)
Glucose-Capillary: 156 mg/dL — ABNORMAL HIGH (ref 70–99)

## 2024-07-24 LAB — C-REACTIVE PROTEIN: CRP: <0.5 mg/dL (ref <1.0)

## 2024-07-24 LAB — PHOSPHORUS: Phosphorus: 2.9 mg/dL (ref 2.5–4.6)

## 2024-07-24 LAB — PROCALCITONIN: Procalcitonin: <0.1 ng/mL

## 2024-07-24 MED ORDER — TRAMADOL HCL 50 MG PO TABS
50.0000 mg | ORAL_TABLET | Freq: Two times a day (BID) | ORAL | 0 refills | Status: DC | PRN
  Filled 2024-07-24: qty 15, 8d supply, fill #0

## 2024-07-24 MED ORDER — NYSTATIN 100000 UNIT/GM EX POWD
Freq: Three times a day (TID) | CUTANEOUS | 0 refills | Status: DC
  Filled 2024-07-24: qty 15, 15d supply, fill #0

## 2024-07-24 NOTE — Discharge Instructions (Addendum)
 Follow with Primary MD [name], MD in 7 days, kindly see your ENT physician within a week you have a lot of wax in your ears and needs to be cleaned out.  Get CBC, CMP, Magnesium , 2 view Chest X ray -  checked next visit with your primary MD   Activity: As tolerated with Full fall precautions use walker/cane & assistance as needed  Disposition Home   Diet: Heart Healthy low carbohydrate diet, check CBGs q. ACHS.  Special Instructions: If you have smoked or chewed Tobacco  in the last 2 yrs please stop smoking, stop any regular Alcohol   and or any Recreational drug use.  On your next visit with your primary care physician please Get Medicines reviewed and adjusted.  Please request your Prim.MD to go over all Hospital Tests and Procedure/Radiological results at the follow up, please get all Hospital records sent to your Prim MD by signing hospital release before you go home.  If you experience worsening of your admission symptoms, develop shortness of breath, life threatening emergency, suicidal or homicidal thoughts you must seek medical attention immediately by calling 911 or calling your MD immediately  if symptoms less severe.  You Must read complete instructions/literature along with all the possible adverse reactions/side effects for all the Medicines you take and that have been prescribed to you. Take any new Medicines after you have completely understood and accpet all the possible adverse reactions/side effects.   Do not drive when taking Pain medications.  Do not take more than prescribed Pain, Sleep and Anxiety Medications  Wear Seat belts while driving.

## 2024-07-24 NOTE — Progress Notes (Signed)
 DISCHARGE NOTE HOME KASONDRA JUNOD to be discharged Home per MD order. Discussed prescriptions and follow up appointments with the patient. Prescriptions given to patient; medication list explained in detail. Patient verbalized understanding.   An After Visit Summary (AVS) was printed and given to the patient.   Home meds givine to patient and in white patient belonging bag  Floor to finish discharge CM arranging ride.  Peyton SHAUNNA Pepper, RN

## 2024-07-24 NOTE — Plan of Care (Signed)
  Problem: Education: Goal: Ability to describe self-care measures that may prevent or decrease complications (Diabetes Survival Skills Education) will improve Outcome: Progressing   Problem: Coping: Goal: Ability to adjust to condition or change in health will improve Outcome: Progressing   Problem: Fluid Volume: Goal: Ability to maintain a balanced intake and output will improve Outcome: Progressing   Problem: Skin Integrity: Goal: Risk for impaired skin integrity will decrease Outcome: Progressing   Problem: Nutrition: Goal: Adequate nutrition will be maintained Outcome: Progressing   Problem: Safety: Goal: Ability to remain free from injury will improve Outcome: Progressing

## 2024-07-24 NOTE — Discharge Summary (Signed)
 Discharge summary note.  Krista Clarke FMW:[id number] DOB: Dec 11, 1940 DOA: 07/17/2024  PCP: Krista Lavern LITTIE, MD  Admit date: 07/17/2024  Discharge date: 07/24/2024  Admitted From: Home   Disposition:  Home   Recommendations for Outpatient Follow-up:   Follow up with PCP in 1-2 weeks  PCP Please obtain BMP/CBC, 2 view CXR in 1week,  (see Discharge instructions)   PCP Please follow up on the following pending results: Kindly monitor CBC, CMP and magnesium  in 5 to 7 days, needs referral to ENT and OB within 7 to 10 days as well.   Home Health: PT, RN if qualifies   Equipment/Devices: as below  Consultations: None  Discharge Condition: Stable    CODE STATUS: Full    Diet Recommendation: Heart Healthy Low Carb    Chief Complaint  Patient presents with   Altered Mental Status     Brief history of present illness from the day of admission and additional interim summary    83 year old female with PMHx of dementia, complete heart block status post PPM ([employer], 07/2015), T2DM, HTN, HLD, chronic pain syndrome on chronic opiate therapy, chronic indwelling Foley catheter, ACD, who presented to [facility] ED from home on 07/17/2024 due to increased urinary frequency, dysuria, vaginal discharge, decreased PO intake, and altered mental status, found to have an AKI.                                                                  Hospital Course   #AKI with hypotension and hypermagnesemia.  Due to excessive dehydration, improved after IV fluids, offending medications including diuretics and ARB held, blood pressure stable magnesium  stable, renal function back to baseline, PCP to monitor medications and fluid status closely.   # Acute cystitis due to chronic indwelling Foley catheter present on admission. - Presenting with  suprapubic discomfort and increased urinary frequency - UA positive for LE and pyuria - Foley exchanged in the ED - Treated with Rocephin  x 7 days here.   #Suspected bacterial vaginosis  - New vaginal discharge, recently completed course of Diflucan , discussed with OB on-call completed 5 days of oral Flagyl  as well.  Follow-up with PCP.  PCP to refer to outpatient OB x 1.   # Acute metabolic encephalopathy on dementia - Per patient's daughter, patient with 2 days of confusion likely multifactorial in the setting of AKI, dehydration, infectious processes as above, superimposed on dementia and chronic opioids, no focal deficits, no headache, minimize narcotics and benzodiazepines, ammonia stable, much improved with supportive care continue to monitor.  Stable B12, stable TSH free T4 slightly elevated request PCP to recheck both in 4 to 6 weeks.    # Hypermagnesemia  - due to dehydration, improved after hydration.  PCP to monitor   # Hypertension -  BP was low throughout her stay here due to dehydration, medications adjusted upon discharge PCP to monitor.   Right ear discomfort.  Exam limited due to wax, Needs to see ENT, both ears have significant amount of wax buildup and needs to be cleaned out   #HLD - Continue home atorvastatin    #Chronic anemia - Hgb stable - Continue home PO iron, PCP to monitor outpatient and do age-appropriate workup as needed.     # T2DM - Diet controlled with oral hypoglycemic agents at home continue.    Discharge diagnosis     Principal Problem:   AKI (acute kidney injury) Active Problems:   DM2 (diabetes mellitus, type 2) (HCC)   History of essential hypertension   Anemia of chronic disease   HLD (hyperlipidemia)   Acute prerenal azotemia   Acute cystitis   Acute metabolic encephalopathy    Discharge instructions    Discharge Instructions     Discharge instructions   Complete by: As directed    Follow with Primary MD Krista Lavern CROME, MD in 7  days,  kindly see your ENT physician within a week you have a lot of wax in your ears and needs to be cleaned out.   Get CBC, CMP, Magnesium , 2 view Chest X ray -  checked next visit with your primary MD   Activity: As tolerated with Full fall precautions use walker/cane & assistance as needed  Disposition Home   Diet: Heart Healthy low carbohydrate diet, check CBGs q. ACHS.  Special Instructions: If you have smoked or chewed Tobacco  in the last 2 yrs please stop smoking, stop any regular Alcohol   and or any Recreational drug use.  On your next visit with your primary care physician please Get Medicines reviewed and adjusted.  Please request your Prim.MD to go over all Hospital Tests and Procedure/Radiological results at the follow up, please get all Hospital records sent to your Prim MD by signing hospital release before you go home.  If you experience worsening of your admission symptoms, develop shortness of breath, life threatening emergency, suicidal or homicidal thoughts you must seek medical attention immediately by calling 911 or calling your MD immediately  if symptoms less severe.  You Must read complete instructions/literature along with all the possible adverse reactions/side effects for all the Medicines you take and that have been prescribed to you. Take any new Medicines after you have completely understood and accpet all the possible adverse reactions/side effects.   Do not drive when taking Pain medications.  Do not take more than prescribed Pain, Sleep and Anxiety Medications  Wear Seat belts while driving.   Increase activity slowly   Complete by: As directed    No wound care   Complete by: As directed        Discharge Medications   Allergies as of 07/24/2024       Reactions   Prednisone Palpitations, Rash        Medication List     STOP taking these medications    amLODipine  5 MG tablet Commonly known as: NORVASC    amoxicillin  500 MG  tablet Commonly known as: AMOXIL    fluconazole  150 MG tablet Commonly known as: Diflucan    furosemide  40 MG tablet Commonly known as: LASIX    HYDROcodone -acetaminophen  5-325 MG tablet Commonly known as: NORCO/VICODIN   losartan  100 MG tablet Commonly known as: COZAAR        TAKE these medications    Accu-Chek Guide Test test strip Generic drug: glucose  blood Use as instructed   Accu-Chek Softclix Lancets lancets Use as instructed   allopurinol  100 MG tablet Commonly known as: ZYLOPRIM  TAKE 1 TABLET BY MOUTH EVERY MORNING   ascorbic acid  500 MG tablet Commonly known as: VITAMIN C  TAKE 1 TABLET BY MOUTH EVERY DAY   Aspirin  Low Dose 81 MG chewable tablet Generic drug: aspirin  CHEW ONE TABLET IN MOUTH EVERY DAY   atorvastatin  40 MG tablet Commonly known as: LIPITOR TAKE 1 TABLET BY MOUTH AT BEDTIME   cyanocobalamin  1000 MCG tablet Commonly known as: VITAMIN B12 TAKE 1 TABLET BY MOUTH EVERY DAY   diclofenac  Sodium 1 % Gel Commonly known as: VOLTAREN  Apply 4 g topically 2 times daily at 12 noon and 4 pm.   FeroSul 325 (65 Fe) MG tablet Generic drug: ferrous sulfate  TAKE 1 TABLET BY MOUTH 2 TIMES DAILY   glipiZIDE  2.5 MG 24 hr tablet Commonly known as: GLUCOTROL  XL Take 2.5 mg by mouth daily.   Lutein  20 MG Caps TAKE 1 CAPSULE BY MOUTH EVERY DAY   magnesium  oxide 400 (240 Mg) MG tablet Commonly known as: MAG-OX Take 1 tablet by mouth daily.   meclizine  12.5 MG tablet Commonly known as: ANTIVERT  Take 1 tablet (12.5 mg total) by mouth 2 (two) times daily as needed for dizziness.   metoprolol  succinate 25 MG 24 hr tablet Commonly known as: TOPROL -XL TAKE 1 TABLET BY MOUTH EVERY DAY   nystatin  powder Commonly known as: MYCOSTATIN /NYSTOP  Apply topically 3 (three) times daily.   omeprazole  20 MG capsule Commonly known as: PRILOSEC TAKE 1 CAPSULE BY MOUTH EVERY DAY BEFORE BREAKFAST What changed: See the new instructions.   traMADol  50 MG  tablet Commonly known as: ULTRAM  Take 1 tablet (50 mg total) by mouth every 12 (twelve) hours as needed for severe pain (pain score 7-10).   triamcinolone  cream 0.1 % Commonly known as: KENALOG  Apply 1 Application topically 2 (two) times daily. For 2 weeks, then as needed What changed:  when to take this reasons to take this additional instructions               Durable Medical Equipment  (From admission, onward)           Start     Ordered   07/24/24 0852  For home use only DME 3 n 1  Once        07/24/24 0851   07/24/24 0851  For home use only DME Walker rolling  Once       Comments: 5 wheel  Question Answer Comment  Walker: With 5 Inch Wheels   Patient needs a walker to treat with the following condition Weakness      07/24/24 0851             Follow-up Information     Krista Lavern CROME, MD. Schedule an appointment as soon as possible for a visit in 1 week(s).   Specialty: Family Medicine Contact information: [address] [place] [address] [phone]                 Major procedures and Radiology Reports - PLEASE review detailed and final reports thoroughly  -        No results found.  Micro Results     Recent Results (from the past 240 hours)  Urine Culture     Status: Abnormal   Collection Time: 07/17/24 10:00 PM   Specimen: Urine, Catheterized  Result Value Ref Range Status   Specimen  Description URINE, CATHETERIZED  Final   Special Requests   Final    NONE Performed at [facility], [address] [place], [place] [address]    Culture MULTIPLE SPECIES PRESENT, SUGGEST RECOLLECTION (A)  Final   Report Status 07/19/2024 FINAL  Final  Wet prep, genital     Status: Abnormal   Collection Time: 07/18/24 10:11 AM  Result Value Ref Range Status   Yeast Wet Prep HPF POC NONE SEEN NONE SEEN Final   Trich, Wet Prep NONE SEEN NONE SEEN Final   Clue Cells Wet Prep HPF POC NONE SEEN NONE SEEN Final   WBC, Wet Prep  HPF POC >=10 (A) <10 Final   Sperm NONE SEEN  Final    Comment: Performed at [facility], [address] [name], [place] [address]    Today   Subjective    Krista Clarke today has no headache,no chest abdominal pain,no new weakness tingling or numbness, feels much better wants to go home today.     Objective   Blood pressure (!) 115/59, pulse 83, temperature (!) 97.2 F (36.2 C), temperature source Oral, resp. rate 16, height 5' 1 (1.549 m), weight 86.2 kg, SpO2 98%.   Intake/Output Summary (Last 24 hours) at 07/24/2024 0856 Last data filed at 07/23/2024 1500 Gross per 24 hour  Intake 766.55 ml  Output 525 ml  Net 241.55 ml    Exam  Awake Alert, No new F.N deficits,    [place].AT,PERRAL Supple Neck,   Symmetrical Chest wall movement, Good air movement bilaterally, CTAB RRR,No Gallops,   +ve B.Sounds, Abd Soft, Non tender,  No Cyanosis, Clubbing or edema    Data Review   Recent Labs  Lab 07/18/24 0637 07/19/24 0308 07/20/24 0318 07/21/24 0411 07/23/24 0945 07/24/24 0256  WBC 9.6 8.4 7.4 8.5 6.6 6.5  HGB 9.2* 9.6* 8.6* 9.0* 8.6* 8.4*  HCT 29.3* 30.8* 27.4* 28.7* 27.6* 26.5*  PLT 373 378 354 405* 362 356  MCV 80.9 80.6 80.6 80.6 80.7 81.8  MCH 25.4* 25.1* 25.3* 25.3* 25.1* 25.9*  MCHC 31.4 31.2 31.4 31.4 31.2 31.7  RDW 17.7* 17.7* 17.9* 17.6* 17.8* 17.9*  LYMPHSABS 2.2  --  1.6 2.3 2.0 1.8  MONOABS 0.8  --  0.7 0.7 0.6 0.6  EOSABS 0.1  --  0.1 0.1 0.2 0.2  BASOSABS 0.0  --  0.0 0.0 0.0 0.0    Recent Labs  Lab 07/18/24 0637 07/18/24 1844 07/19/24 0308 07/20/24 0318 07/21/24 0411 07/23/24 0010 07/23/24 0343 07/23/24 0945 07/24/24 0256  NA 135  --  135 132* 137  --   --  139 137  K 4.4  --  4.2 4.1 4.2  --   --  4.3 4.0  CL 95*  --  96* 96* 99  --   --  102 105  CO2 26  --  28 27 25   --   --  27 25  ANIONGAP 14  --  11 9 13   --   --  10 7  GLUCOSE 94  --  120* 161* 120*  --   --  112* 115*  BUN 68*  --  53* 38* 25*  --   --  28* 22  CREATININE  1.78*  --  1.38* 0.82 1.04*  --   --  1.11* 0.93  AST 15  --   --   --   --   --   --   --   --  ALT 15  --   --   --   --   --   --   --   --   ALKPHOS 63  --   --   --   --   --   --   --   --   BILITOT 0.9  --   --   --   --   --   --   --   --   ALBUMIN 2.4*  --   --   --   --   --   --   --   --   CRP  --   --   --   --   --   --   --  0.5 <0.5  PROCALCITON  --   --   --   --   --   --   --  <0.10 <0.10  LATICACIDVEN  --   --   --   --   --  1.1 0.7  --   --   INR  --  1.1  --   --   --   --   --   --   --   TSH 4.483  --  4.236  --   --   --   --  5.412*  --   HGBA1C 6.2*  --   --   --   --   --   --   --   --   AMMONIA <13  --   --   --   --   --   --   --   --   MG 3.4*  --  3.1* 2.7* 2.3  --   --  2.1 2.0  PHOS  --   --   --   --   --   --   --   --  2.9  CALCIUM  8.5*  --  8.3* 8.2* 8.4*  --   --  8.5* 8.5*    Total Time in preparing paper work, data evaluation and todays exam - 35 minutes  Signature  -    Lavada Stank M.D on 07/24/2024 at 8:56 AM   -  To page go to [email or web address]

## 2024-07-24 NOTE — Progress Notes (Signed)
 [facility] CENTER  PROCEDURAL EXPEDITER PROGRESS NOTE  Patient Name: Krista Clarke  DOB:May 01, 1941 Date of Admission: 07/17/2024  Date of Assessment:07/24/24   -------------------------------------------------------------------------------------------------------------------  No Barriers noted for pt to be d/c home -------------------------------------------------------------------------------------------------------------------  [facility] Expediter, [name] Please contact us  directly via secure chat (search for [facility]) or by calling us  at [phone] [facility]).

## 2024-07-24 NOTE — TOC Transition Note (Addendum)
 Transition of Care [facility]) - Discharge Note   Patient Details  Name: Krista Clarke MRN: [id number] Date of Birth: Oct 17, 1940  Transition of [facility]) CM/SW Contact:  Landry DELENA Senters, RN Phone Number: 07/24/2024, 10:31 AM   Clinical Narrative:    Patient will discharge to home today, with daughter. [facility] providing transportation to home, address verified for transportation.   Patient to resume [facility] services with [facility]. Info on [facility] and [employer] notified of patient d/c.   Rolling walker and drop-arm BSC to be delivered to patient's home via [employer].   CM spoke with patient's daughter, [name], to confirm all information and inform her that DME will be delivered to patient home.   No other needs identified by CM.    Final next level of care: Home w Home Health Services Barriers to Discharge: No Barriers Identified   Patient Goals and CMS Choice   CMS Medicare.gov Compare Post Acute Care list provided to:: Patient Choice offered to / list presented to : Patient      Discharge Placement                       Discharge Plan and Services Additional resources added to the After Visit Summary for                  DME Arranged: Walker rolling, Bedside commode DME Agency: [employer] Date DME Agency Contacted: 07/24/24 Time DME Agency Contacted: 1029 Representative spoke with at DME Agency: [place] [facility] Arranged: PT, Nurse's Aide, Social Work, THERAPIST, MUSIC Agency: [facility] Date [facility] Agency Contacted: 07/24/24 Time HH Agency Contacted: 1031    Social Drivers of Health (SDOH) Interventions SDOH Screenings   Food Insecurity: No Food Insecurity (07/18/2024)  Housing: Low Risk  (07/18/2024)  Transportation Needs: No Transportation Needs (07/18/2024)  Utilities: Not At Risk (07/18/2024)  Depression (PHQ2-9): Low Risk  (07/04/2024)  Financial Resource Strain: Low Risk  (01/18/2023)  Physical Activity: Inactive (03/06/2021)  Social Connections: Socially Isolated  (07/18/2024)  Stress: No Stress Concern Present (03/06/2021)  Tobacco Use: Low Risk  (07/17/2024)     Readmission Risk Interventions    06/12/2024    3:09 PM 02/21/2024   11:40 AM  Readmission Risk Prevention Plan  Transportation Screening Complete Complete  PCP or Specialist Appt within 5-7 Days  Complete  PCP or Specialist Appt within 3-5 Days Complete   Home Care Screening  Complete  Medication Review (RN CM)  Complete  Social Work Consult for Recovery Care Planning/Counseling Complete   Palliative Care Screening Not Applicable   Medication Review Ocean[facility]) Complete

## 2024-07-25 ENCOUNTER — Telehealth: Payer: Self-pay | Admitting: *Deleted

## 2024-07-25 NOTE — Transitions of Care (Post Inpatient/ED Visit) (Signed)
   07/25/2024  Name: Krista Clarke MRN: [id number] DOB: 08-02-1941  Today's TOC FU Call Status: Today's TOC FU Call Status:: Unsuccessful Call (1st Attempt) Unsuccessful Call (1st Attempt) Date: 07/25/24  Attempted to reach the patient regarding the most recent Inpatient/ED visit.  Follow Up Plan: Additional outreach attempts will be made to reach the patient to complete the Transitions of Care (Post Inpatient/ED visit) call.   [name] [facility], BSN RN Care Manager/ Transition of Care [place] [phone]

## 2024-07-30 ENCOUNTER — Telehealth: Payer: Self-pay

## 2024-07-30 NOTE — Telephone Encounter (Signed)
 Copied from CRM #[id number]. Topic: Clinical - Home Health Verbal Orders >> Jul 30, 2024  2:22 PM Avram MATSU wrote: Caller/Agency: Signe Rushing Number: [phone] Service Requested: Physical Therapy Frequency: 1*2 Any new concerns about the patient? Yes confusion and increase agitation, starting on Friday, medication refusal, chronic pain 610 hip down. bruise around the knees.    Requesting a new hospital bed that's a half rail so it wont push patient on the back of her legs going to [facility]  Spoke with Signe to move forward with verbal orders.

## 2024-07-31 ENCOUNTER — Encounter: Payer: Self-pay | Admitting: Family Medicine

## 2024-08-01 MED ORDER — TRAMADOL HCL 50 MG PO TABS: 50.0000 mg | ORAL_TABLET | Freq: Two times a day (BID) | ORAL | 2 refills | Status: DC | PRN

## 2024-08-03 ENCOUNTER — Other Ambulatory Visit: Payer: Self-pay | Admitting: Family Medicine

## 2024-08-07 ENCOUNTER — Ambulatory Visit

## 2024-08-07 NOTE — Telephone Encounter (Signed)
 Please review and advise. Tks

## 2024-08-07 NOTE — Telephone Encounter (Signed)
 Order has been faxed to [facility]

## 2024-08-08 DIAGNOSIS — Z0279 Encounter for issue of other medical certificate: Secondary | ICD-10-CM

## 2024-08-08 LAB — CUP PACEART REMOTE DEVICE CHECK
Battery Remaining Longevity: 18 mo
Battery Remaining Percentage: 14 %
Battery Voltage: 2.87 V
Brady Statistic AP VP Percent: 14 %
Brady Statistic AP VS Percent: 1 %
Brady Statistic AS VP Percent: 86 %
Brady Statistic AS VS Percent: 1 %
Brady Statistic RA Percent Paced: 14 %
Brady Statistic RV Percent Paced: 99 %
Date Time Interrogation Session: 20251209020014
Implantable Lead Connection Status: 753985
Implantable Lead Connection Status: 753985
Implantable Lead Implant Date: 20161221
Implantable Lead Implant Date: 20161221
Implantable Lead Location: 753860
Implantable Lead Location: 753860
Implantable Pulse Generator Implant Date: 20161221
Lead Channel Impedance Value: 390 Ohm
Lead Channel Impedance Value: 560 Ohm
Lead Channel Pacing Threshold Amplitude: 0.5 V
Lead Channel Pacing Threshold Amplitude: 1 V
Lead Channel Pacing Threshold Pulse Width: 0.5 ms
Lead Channel Pacing Threshold Pulse Width: 0.5 ms
Lead Channel Sensing Intrinsic Amplitude: 2.3 mV
Lead Channel Sensing Intrinsic Amplitude: 9 mV
Lead Channel Setting Pacing Amplitude: 1.25 V
Lead Channel Setting Pacing Amplitude: 1.5 V
Lead Channel Setting Pacing Pulse Width: 0.5 ms
Lead Channel Setting Sensing Sensitivity: 4 mV
Pulse Gen Model: 2240
Pulse Gen Serial Number: 7839181

## 2024-08-09 ENCOUNTER — Telehealth: Payer: Self-pay

## 2024-08-09 ENCOUNTER — Ambulatory Visit: Payer: Self-pay | Admitting: Cardiology

## 2024-08-09 NOTE — Telephone Encounter (Signed)
 Copied from CRM #[id number]. Topic: Clinical - Home Health Verbal Orders >> Aug 09, 2024  2:31 PM Roselie BROCKS wrote: Caller/Agency: [facility]  Callback Number: [phone] jim  Service Requested: Physical Therapy Frequency:  Any new concerns about the patient? Yes  last week pt had significant imporvement ,reduced confusion and pain ,pt was constipated ,family on tuesday gave her magnisium citrate, gave enima, since bowel movement patient feeling much better, discharging form home health pt today 08-09-24, to get out of bed patient needs a hoyer lift, but  patient is decling that.  pt has hospital bed with railing all the way down bed, and patient needs a half rail instead and durable medical equipment requres orders for that.  Spoke with PT and they are needing something for the pt to take daily for constipation

## 2024-08-14 NOTE — Progress Notes (Signed)
 Remote PPM Transmission

## 2024-08-14 NOTE — Telephone Encounter (Signed)
 Spoke with Signe and gave him recommendation for pt

## 2024-08-15 ENCOUNTER — Encounter: Payer: Self-pay | Admitting: Family Medicine

## 2024-08-16 NOTE — Telephone Encounter (Signed)
 Please review and advise. Tks

## 2024-08-16 NOTE — Telephone Encounter (Signed)
 Please review and advise.

## 2024-08-21 ENCOUNTER — Telehealth: Payer: Self-pay | Admitting: Family Medicine

## 2024-08-21 NOTE — Telephone Encounter (Signed)
 [facility] faxed Home Health Certificate (Order [place] [id number]), to be filled out by provider. Patient requested to send it back via Fax within ASAP. Document is located in providers tray at front office.Please advise at [phone].

## 2024-08-21 NOTE — Telephone Encounter (Signed)
 Placed in box for review and signature.

## 2024-08-24 ENCOUNTER — Ambulatory Visit: Payer: Self-pay

## 2024-08-24 NOTE — Telephone Encounter (Signed)
 FYI Only or Action Required?: FYI only for provider: pain med question.  Patient was last seen in primary care on 07/04/2024 by [name], MD.  Called Nurse Triage reporting Pain.  Symptoms began a week ago.  Interventions attempted: Rest, hydration, or home remedies.  Symptoms are: rapidly worsening.  Triage Disposition: Information or Advice Only Call  Patient/caregiver understands and will follow disposition?:   Reviewed chart. Read to nurse Dr. Javan last message re: hospice. She will manage pain via hospice orders.  Copied from CRM #[id number]. Topic: Clinical - Red Word Triage >> Aug 24, 2024  3:23 PM Krista Clarke wrote: Red Word that prompted transfer to Nurse Triage: Nurse Jenkins From [facility] is calling stating patient is in severe pain.She has an abscess in her mouth and it is causing her to be unable to swallow. Nurse does not believe she will be 'with us ' much longer.  She rates her pain 8/10 and states she is aching all over and tramadol  is not touching the pain at this point. Reason for Disposition  Health information question, no triage required and triager able to answer question  Answer Assessment - Initial Assessment Questions 1. REASON FOR CALL: What is the main reason for your call? or How can I best help you?     Hospice nurse calling requesting verification that Hospice is managing care.   Has order directing care to hospice 2. SYMPTOMS : Do you have any symptoms?      Widespread pain, sore in mouth  Protocols used: Information Only Call - No Triage-A-AH

## 2024-08-24 NOTE — Telephone Encounter (Signed)
 Noted

## 2024-08-27 ENCOUNTER — Telehealth: Payer: Self-pay | Admitting: Family Medicine

## 2024-08-27 NOTE — Telephone Encounter (Signed)
 [facility] faxed Hospice Certificate and plan of care (Order ID [id number]), to be filled out by provider. [facility] requested to send it back via Fax within ASAP. Document is located in providers tray at front office.Please advise at [phone].

## 2024-08-27 NOTE — Telephone Encounter (Signed)
 In Provider box for review and signature.

## 2024-08-31 NOTE — Telephone Encounter (Signed)
 Placed on Desk.

## 2024-09-03 NOTE — Telephone Encounter (Signed)
 Faxed 09/01/2023

## 2024-09-11 ENCOUNTER — Ambulatory Visit: Admitting: Family Medicine

## 2024-09-21 ENCOUNTER — Telehealth: Payer: Self-pay

## 2024-09-21 NOTE — Telephone Encounter (Signed)
 Copied from CRM [id number]. Topic: General - Deceased Patient >> 10-05-24  1:04 PM Krista Clarke wrote: Name of caller: [name]  Date of death: 2024/09/24   Name of funeral home: [name]  Phone number of funeral home: [phone]  Provider that needs to sign form: Dr. Lavern Heck  Timeline for signing: 09-24-24 urgent  Record Number: [id number]

## 2024-09-26 ENCOUNTER — Telehealth: Payer: Self-pay

## 2024-09-26 NOTE — Telephone Encounter (Signed)
 Copied from CRM #[id number]. Topic: General - Other >> Sep 26, 2024  1:24 PM Nessti S wrote: Reason for CRM: wanda called about home health certificate order id [id number]. Relayed it was faxed back 08/31/24 but she stated it was not received and would like it faxed to personal fax number [phone]  Form has been refaxed

## 2024-09-30 DEATH — deceased

## 2024-11-06 ENCOUNTER — Ambulatory Visit

## 2025-02-05 ENCOUNTER — Ambulatory Visit

## 2025-05-07 ENCOUNTER — Ambulatory Visit

## 2025-08-06 ENCOUNTER — Ambulatory Visit

## 2025-11-05 ENCOUNTER — Ambulatory Visit
